# Patient Record
Sex: Male | Born: 1940 | Race: White | Hispanic: No | Smoking: Former smoker
Health system: Southern US, Community
[De-identification: ages and names within clinical notes are randomized; demographics above are authoritative.]

## PROBLEM LIST (undated history)

## (undated) DIAGNOSIS — D649 Anemia, unspecified: Secondary | ICD-10-CM

## (undated) DIAGNOSIS — I350 Nonrheumatic aortic (valve) stenosis: Secondary | ICD-10-CM

## (undated) DIAGNOSIS — I509 Heart failure, unspecified: Secondary | ICD-10-CM

## (undated) DIAGNOSIS — M109 Gout, unspecified: Secondary | ICD-10-CM

## (undated) DIAGNOSIS — I340 Nonrheumatic mitral (valve) insufficiency: Secondary | ICD-10-CM

## (undated) DIAGNOSIS — I5020 Unspecified systolic (congestive) heart failure: Secondary | ICD-10-CM

## (undated) DIAGNOSIS — K759 Inflammatory liver disease, unspecified: Secondary | ICD-10-CM

## (undated) DIAGNOSIS — I2510 Atherosclerotic heart disease of native coronary artery without angina pectoris: Secondary | ICD-10-CM

## (undated) DIAGNOSIS — R7303 Prediabetes: Secondary | ICD-10-CM

## (undated) DIAGNOSIS — I10 Essential (primary) hypertension: Secondary | ICD-10-CM

## (undated) DIAGNOSIS — E7800 Pure hypercholesterolemia, unspecified: Secondary | ICD-10-CM

## (undated) HISTORY — DX: Pure hypercholesterolemia, unspecified: E78.00

## (undated) HISTORY — PX: HERNIA REPAIR: SHX51

## (undated) HISTORY — DX: Essential (primary) hypertension: I10

## (undated) HISTORY — DX: Prediabetes: R73.03

## (undated) HISTORY — PX: EYE SURGERY: SHX253

## (undated) HISTORY — DX: Gout, unspecified: M10.9

---

## 1962-02-07 DIAGNOSIS — K759 Inflammatory liver disease, unspecified: Secondary | ICD-10-CM

## 1962-02-07 HISTORY — DX: Inflammatory liver disease, unspecified: K75.9

## 2007-09-04 ENCOUNTER — Encounter: Admission: RE | Admit: 2007-09-04 | Discharge: 2007-10-02 | Payer: Self-pay | Admitting: Family Medicine

## 2008-12-03 ENCOUNTER — Encounter: Admission: RE | Admit: 2008-12-03 | Discharge: 2008-12-23 | Payer: Self-pay | Admitting: Family Medicine

## 2011-12-26 ENCOUNTER — Other Ambulatory Visit: Payer: Self-pay | Admitting: Family Medicine

## 2011-12-26 DIAGNOSIS — IMO0002 Reserved for concepts with insufficient information to code with codable children: Secondary | ICD-10-CM

## 2011-12-27 ENCOUNTER — Ambulatory Visit
Admission: RE | Admit: 2011-12-27 | Discharge: 2011-12-27 | Disposition: A | Discharge status: Home / Self Care | Payer: Medicare Other | Source: Ambulatory Visit | Attending: Family Medicine | Admitting: Family Medicine

## 2011-12-27 DIAGNOSIS — IMO0002 Reserved for concepts with insufficient information to code with codable children: Secondary | ICD-10-CM

## 2012-09-17 ENCOUNTER — Encounter (INDEPENDENT_AMBULATORY_CARE_PROVIDER_SITE_OTHER): Payer: Self-pay | Admitting: Ophthalmology

## 2012-10-09 ENCOUNTER — Encounter (INDEPENDENT_AMBULATORY_CARE_PROVIDER_SITE_OTHER): Payer: 59 | Admitting: Ophthalmology

## 2012-10-09 DIAGNOSIS — H31009 Unspecified chorioretinal scars, unspecified eye: Secondary | ICD-10-CM

## 2012-10-09 DIAGNOSIS — I10 Essential (primary) hypertension: Secondary | ICD-10-CM

## 2012-10-09 DIAGNOSIS — H43819 Vitreous degeneration, unspecified eye: Secondary | ICD-10-CM

## 2012-10-09 DIAGNOSIS — D3130 Benign neoplasm of unspecified choroid: Secondary | ICD-10-CM

## 2012-10-09 DIAGNOSIS — H35039 Hypertensive retinopathy, unspecified eye: Secondary | ICD-10-CM

## 2013-02-11 ENCOUNTER — Ambulatory Visit (INDEPENDENT_AMBULATORY_CARE_PROVIDER_SITE_OTHER): Payer: Medicare Other | Admitting: Internal Medicine

## 2013-02-11 ENCOUNTER — Ambulatory Visit: Payer: Medicare Other

## 2013-02-11 VITALS — BP 140/82 | HR 91 | Temp 98.7°F | Resp 18 | Wt 206.0 lb

## 2013-02-11 DIAGNOSIS — Z23 Encounter for immunization: Secondary | ICD-10-CM

## 2013-02-11 DIAGNOSIS — K219 Gastro-esophageal reflux disease without esophagitis: Secondary | ICD-10-CM

## 2013-02-11 DIAGNOSIS — I10 Essential (primary) hypertension: Secondary | ICD-10-CM

## 2013-02-11 DIAGNOSIS — S61210A Laceration without foreign body of right index finger without damage to nail, initial encounter: Secondary | ICD-10-CM

## 2013-02-11 DIAGNOSIS — M109 Gout, unspecified: Secondary | ICD-10-CM

## 2013-02-11 DIAGNOSIS — M79609 Pain in unspecified limb: Secondary | ICD-10-CM

## 2013-02-11 DIAGNOSIS — S61209A Unspecified open wound of unspecified finger without damage to nail, initial encounter: Secondary | ICD-10-CM

## 2013-02-11 NOTE — Progress Notes (Signed)
Procedure Note: Verbal consent obtained from the patient.  Metacarpal block with 2 cc Lidocaine 2% without epinephrine.  Wound scrubbed with soap and water.  Wound explored.  No foreign bodies or deep structure injury noted.  Penrose drain applied as tourniquet to right index finger.  Wound closed with #6 simple interrupted sutures of 5-0 ethilon.  Tourniquet removed.  Hemostasis obtained.  Cap refill normal.  Area cleansed and dressed.  Pt tolerated very well.  Anticipate suture removal in 10 days.

## 2013-02-11 NOTE — Patient Instructions (Signed)

## 2013-02-11 NOTE — Progress Notes (Addendum)
Subjective:    Patient ID: [name], male    DOB: 01-May-1940, 73 y.o.   MRN: [id number]  This chart was scribed for [facility] by Celesta Gentile, Scribe. This patient was seen in room 7 and the patient's care was started at 7:13 PM.  Laceration  The incident occurred less than 1 hour ago. The laceration is located on the right hand. The laceration is 1 cm in size. The laceration mechanism was a clean knife. The pain is mild. He reports no foreign bodies present. His tetanus status is unknown.    HPI Comments: [name] is a 73 y.o. male who presents to the [facility] complaining of a laceration to his right index finger that occurred about an hour ago.  Pt was working at [employer], where he was using a clean knife to cut vegetables and cut the distal part of his right index finger.  Pt had immediate bleeding and continued to bleed upon arrival.  Pt was given gauze in the waiting room due to the amount of bleeding.  Pt does not believe the knife injured the phalangeal bone.  Pt does not recall if his tetanus is UTD.      History   Social History  . Marital Status: Married    Spouse Name: N/A    Number of Children: N/A  . Years of Education: N/A   Occupational History  . Not on file.   Social History Main Topics  . Smoking status: Former Research scientist (life sciences)  . Smokeless tobacco: Not on file  . Alcohol Use: Yes  . Drug Use: No  . Sexual Activity: Not on file   Other Topics Concern  . Not on file   Social History Narrative  . No narrative on file    Review of Systems  Constitutional: Negative for fever and chills.  HENT: Negative for congestion and rhinorrhea.   Respiratory: Negative for cough and shortness of breath.   Cardiovascular: Negative for chest pain.  Gastrointestinal: Negative for nausea, vomiting, abdominal pain and diarrhea.  Musculoskeletal: Negative for back pain.  Skin: Positive for wound (laceration on right index finger).  Negative for color change and rash.       Objective:   Physical Exam  Nursing note and vitals reviewed. Constitutional: He is oriented to person, place, and time. He appears well-developed and well-nourished. No distress.  HENT:  Head: Normocephalic and atraumatic.  Eyes: Conjunctivae are normal. Right eye exhibits no discharge. Left eye exhibits no discharge.  Neck: Normal range of motion.  Cardiovascular: Normal rate.   Pulmonary/Chest: Effort normal. No respiratory distress.  Musculoskeletal: Normal range of motion.  Neurological: He is alert and oriented to person, place, and time.  Skin: Skin is warm and dry.  1cm laceration on distal right index finger.   On the volar surface Fingertip is well vascularized and still has sensation intact PIP joint is stable/DIP joint is stable It is difficult to control his bleeding although he is on no medications that would affect this  Psychiatric: He has a normal mood and affect. His behavior is normal.    Triage Vitals: BP 140/82  Pulse 91  Temp(Src) 98.7 F (37.1 C) (Oral)  Resp 18  Wt 206 lb (93.441 kg)  SpO2 98%  DIAGNOSTIC STUDIES: Oxygen Saturation is 98% on RA, Normal by my interpretation.    COORDINATION OF CARE: 7:19 PM- Patient informed of current plan of treatment and evaluation and agrees with plan.  [facility] reading (PRIMARY) by  Dr. Stasia Cavalier fx distal tuft  Note suture repair by [place]:  Laceration of right index finger w/o foreign body w/o damage to nail, initial encounter - Plan: Tdap vaccine greater than or equal to 7yo IM, DG Finger Index Right  Pain, hand, right secondary  Wound care Suture removal 10 days  I have completed the patient encounter in its entirety as documented by the scribe, with editing by me where necessary. Baily Serpe P. Laney Pastor, M.D.

## 2013-02-12 DIAGNOSIS — M109 Gout, unspecified: Secondary | ICD-10-CM | POA: Insufficient documentation

## 2013-02-12 DIAGNOSIS — I10 Essential (primary) hypertension: Secondary | ICD-10-CM | POA: Insufficient documentation

## 2013-02-12 DIAGNOSIS — K219 Gastro-esophageal reflux disease without esophagitis: Secondary | ICD-10-CM | POA: Insufficient documentation

## 2013-02-21 ENCOUNTER — Ambulatory Visit (INDEPENDENT_AMBULATORY_CARE_PROVIDER_SITE_OTHER): Payer: Medicare Other | Admitting: Physician Assistant

## 2013-02-21 VITALS — BP 150/90 | HR 60 | Temp 97.9°F | Resp 16 | Wt 206.0 lb

## 2013-02-21 DIAGNOSIS — Z4802 Encounter for removal of sutures: Secondary | ICD-10-CM

## 2013-02-21 NOTE — Progress Notes (Signed)
   Subjective:    Patient ID: [name], male    DOB: [birth year], 73 y.o.   MRN: [id number]  HPI   [name] is a very pleasant 73 yr old male here for removal of sutures placed her 10 days ago.  He is doing well.  No pain, drainage.    Review of Systems  Constitutional: Negative for fever and chills.  Respiratory: Negative.   Cardiovascular: Negative.   Skin: Positive for wound.       Objective:   Physical Exam  Vitals reviewed. Constitutional: He is oriented to person, place, and time. He appears well-developed and well-nourished. No distress.  HENT:  Head: Normocephalic and atraumatic.  Eyes: Conjunctivae are normal. No scleral icterus.  Pulmonary/Chest: Effort normal.  Neurological: He is alert and oriented to person, place, and time.  Skin: Skin is warm and dry.  Healing wound at right index finger; sutures removed without difficulty; one steristrip placed for extra support  Psychiatric: He has a normal mood and affect. His behavior is normal.       Assessment & Plan:  Encounter for removal of sutures   [name] is a very pleasant 73 yr old male here for suture removal.  Sutures removed without difficulty.  Wound is healing very well.  One steri strip placed for extra support.  Pt asks if able to play disk golf this weekend.  I think this is fine as long as wound is covered and is not painful.  Pt to return as needs arise   E. Natividad Brood MHS, PA-C [facility] 1/15/201510:05 PM

## 2013-10-09 ENCOUNTER — Ambulatory Visit (INDEPENDENT_AMBULATORY_CARE_PROVIDER_SITE_OTHER): Payer: 59 | Admitting: Ophthalmology

## 2014-01-17 ENCOUNTER — Ambulatory Visit (INDEPENDENT_AMBULATORY_CARE_PROVIDER_SITE_OTHER): Payer: Medicare Other | Admitting: Family Medicine

## 2014-01-17 VITALS — BP 146/90 | HR 96 | Temp 98.3°F | Resp 16 | Ht 72.0 in | Wt 182.0 lb

## 2014-01-17 DIAGNOSIS — R51 Headache: Secondary | ICD-10-CM

## 2014-01-17 DIAGNOSIS — R519 Headache, unspecified: Secondary | ICD-10-CM

## 2014-01-17 DIAGNOSIS — S0101XA Laceration without foreign body of scalp, initial encounter: Secondary | ICD-10-CM

## 2014-01-17 NOTE — Progress Notes (Signed)
Procedure: Consent obtained.  2.5cc of 1% Lidocaine with Epi injected into the 2.4cm wound site for anesthesia.  Cleaned with soap and water.  6-0 ethicon utilized for 6 simple interrupted sutures.  Cleaned with normal saline.  Dressing applied.

## 2014-01-17 NOTE — Progress Notes (Signed)
[facility] [address], [place] [address] [phone]  Date:  01/17/2014   Name:  [name]   DOB:  07/16/1940   MRN:  [id number]  PCP:  No PCP Per Patient    Chief Complaint: Head Laceration   History of Present Illness:  [name] is a 73 y.o. very pleasant male patient who presents with the following:  He got hit in the head today with a hard frisbee golf frisbee.  No LOC. He had a bleeding cut so he came in to be seen.   No headache, nasuea or confusion.  He does not feel like he has a head injury except for this laceration.   He just had a tetanus shot.   He otherwise feels well today Generally in good health, history of HTN   Patient Active Problem List   Diagnosis Date Noted  . Gout 02/12/2013  . Unspecified essential hypertension 02/12/2013  . GERD (gastroesophageal reflux disease) 02/12/2013    Past Medical History  Diagnosis Date  . Hypertension   . Gout     History reviewed. No pertinent past surgical history.  History  Substance Use Topics  . Smoking status: Former Research scientist (life sciences)  . Smokeless tobacco: Not on file  . Alcohol Use: Yes    Family History  Problem Relation Age of Onset  . Stroke Mother   . Heart attack Father     No Known Allergies  Medication list has been reviewed and updated.  Current Outpatient Prescriptions on File Prior to Visit  Medication Sig Dispense Refill  . allopurinol (ZYLOPRIM) 100 MG tablet Take 100 mg by mouth daily.    Marland Kitchen lisinopril (PRINIVIL,ZESTRIL) 20 MG tablet Take 20 mg by mouth daily.    Marland Kitchen omeprazole (PRILOSEC) 20 MG capsule Take 20 mg by mouth daily.     No current facility-administered medications on file prior to visit.    Review of Systems:  As per HPI- otherwise negative.   Physical Examination: Filed Vitals:   01/17/14 1619  BP: 146/90  Pulse: 96  Temp: 98.3 F (36.8 C)  Resp: 16   Filed Vitals:   01/17/14 1619  Height: 6' (1.829 m)  Weight: 182 lb  (82.555 kg)   Body mass index is 24.68 kg/(m^2). Ideal Body Weight: Weight in (lb) to have BMI = 25: 183.9  GEN: WDWN, NAD, Non-toxic, A & O x 3, looks well HEENT: Atraumatic, Normocephalic. Neck supple. No masses, No LAD.  Bilateral TM wnl, oropharynx normal.  PEERL,EOMI.   Ears and Nose: No external deformity. CV: RRR, No M/G/R. No JVD. No thrill. No extra heart sounds. PULM: CTA B, no wheezes, crackles, rhonchi. No retractions. No resp. distress. No accessory muscle use. EXTR: No c/c/e NEURO Normal gait.  PSYCH: Normally interactive. Conversant. Not depressed or anxious appearing.  Calm demeanor.  There is an approx 2.5 cm laceration over his left brow.  It is well- approximated and does not appear to be deep  Assessment and Plan: Laceration of scalp, initial encounter  Pain of scalp  Treated laceration with repair as per PA-C note.    Signed Lamar Blinks, MD

## 2014-01-17 NOTE — Patient Instructions (Signed)

## 2014-01-22 ENCOUNTER — Encounter: Payer: Self-pay | Admitting: Urgent Care

## 2014-01-22 ENCOUNTER — Ambulatory Visit (INDEPENDENT_AMBULATORY_CARE_PROVIDER_SITE_OTHER): Payer: Medicare Other | Admitting: Urgent Care

## 2014-01-22 VITALS — BP 134/84 | HR 72 | Temp 98.0°F | Resp 18

## 2014-01-22 DIAGNOSIS — S0181XD Laceration without foreign body of other part of head, subsequent encounter: Secondary | ICD-10-CM

## 2014-01-22 DIAGNOSIS — Z4802 Encounter for removal of sutures: Secondary | ICD-10-CM

## 2014-01-22 NOTE — Progress Notes (Signed)
    MRN: [id number] DOB: 1940-04-11  Subjective:   [name] is a 73 y.o. male presenting for wound care of facial laceration sustained on 01/17/2014. Patient denies bleeding, drainage, erythema around wound. Also denies headache, confusion, fevers, n/v, changes in vision or sensation, ROM intact. Denies any other aggravating or relieving factors, no other questions or concerns.  [name] has a current medication list which includes the following prescription(s): allopurinol, lisinopril, and omeprazole.  He has No Known Allergies.  [name]  has a past medical history of Hypertension and Gout. Also  has no past surgical history on file.  ROS As in subjective.  Objective:   Vitals: BP 134/84 mmHg  Pulse 72  Temp(Src) 98 F (36.7 C) (Oral)  Resp 18  Physical Exam  Constitutional: He is oriented to person, place, and time and well-developed, well-nourished, and in no distress.  Eyes: Conjunctivae and EOM are normal. Right eye exhibits no discharge. Left eye exhibits no discharge. No scleral icterus.  Neck: Normal range of motion.  Cardiovascular: Normal rate.   Pulmonary/Chest: Effort normal.  Neurological: He is alert and oriented to person, place, and time.  Skin: Skin is warm and dry. No rash noted. He is not diaphoretic. No erythema.  Psychiatric: Mood and affect normal.   Laceration over left forehead just superior to left eyebrow well approximated, granulation tissue present, no erythema, edema or drainage.  Suture removed with minimal bleeding over 2 areas where suture was embedded. Otherwise sutures removed without incident.  Assessment and Plan :   1. Facial laceration, subsequent encounter 2. Encounter for removal of sutures - well-healed, sutures removed, anticipatory guidance provided - return to clinic if symptoms worsen, fail to resolve or as needed   Jaynee Eagles, PA-C [facility] [phone] 01/22/2014 4:23  PM

## 2015-02-10 ENCOUNTER — Ambulatory Visit (INDEPENDENT_AMBULATORY_CARE_PROVIDER_SITE_OTHER): Payer: Medicare Other | Admitting: Emergency Medicine

## 2015-02-10 VITALS — BP 146/88 | HR 50 | Temp 97.0°F | Resp 16 | Ht 71.0 in | Wt 185.6 lb

## 2015-02-10 DIAGNOSIS — M79644 Pain in right finger(s): Secondary | ICD-10-CM | POA: Diagnosis not present

## 2015-02-10 DIAGNOSIS — S61219A Laceration without foreign body of unspecified finger without damage to nail, initial encounter: Secondary | ICD-10-CM

## 2015-02-10 DIAGNOSIS — S61211A Laceration without foreign body of left index finger without damage to nail, initial encounter: Secondary | ICD-10-CM

## 2015-02-10 MED ORDER — CEPHALEXIN 250 MG PO CAPS: 250.0000 mg | ORAL_CAPSULE | Freq: Four times a day (QID) | ORAL | Status: AC

## 2015-02-10 NOTE — Progress Notes (Signed)
Procedure: verbal consent obtained. 1% lidocaine applied for anesthesia at the right middle finger mcp.  Cleansed with soap and water.  Tendon intact.  5-0 ethilon used to place simple interrupted sutures at the 2.cm  wound. Cleansed with normal saline Dressings applied.

## 2015-02-10 NOTE — Progress Notes (Signed)
Subjective:  Patient ID: [name], male    DOB: [birth year]  Age: 75 y.o. MRN: [id number]  CC: Hand Injury   HPI [name] presents   Has a laceration on the the dorsum of his right index finger. And that's the PIP joint. He suffered the injury last night working in  In the [employer]. He has been washing dishes since with the wound bandaged. He has no history of of anticoagulant use. He does have pain in his finger.  History [name] has a past medical history of Hypertension and Gout.   He has no past surgical history on file.   His  family history includes Heart attack in his father; Stroke in his mother.  He   reports that he has quit smoking. He does not have any smokeless tobacco history on file. He reports that he drinks alcohol. He reports that he does not use illicit drugs.  Outpatient Prescriptions Prior to Visit  Medication Sig Dispense Refill  . allopurinol (ZYLOPRIM) 100 MG tablet Take 100 mg by mouth daily.    Marland Kitchen omeprazole (PRILOSEC) 20 MG capsule Take 20 mg by mouth daily.    Marland Kitchen lisinopril (PRINIVIL,ZESTRIL) 20 MG tablet Take 20 mg by mouth daily. Reported on 02/10/2015     No facility-administered medications prior to visit.    Social History   Social History  . Marital Status: Married    Spouse Name: N/A  . Number of Children: N/A  . Years of Education: N/A   Social History Main Topics  . Smoking status: Former Research scientist (life sciences)  . Smokeless tobacco: None  . Alcohol Use: Yes  . Drug Use: No  . Sexual Activity: Not Asked   Other Topics Concern  . None   Social History Narrative     Review of Systems  Constitutional: Negative for fever, chills and appetite change.  HENT: Negative for congestion, ear pain, postnasal drip, sinus pressure and sore throat.   Eyes: Negative for pain and redness.  Respiratory: Negative for cough, shortness of breath and wheezing.   Cardiovascular: Negative for leg swelling.  Gastrointestinal: Negative for  nausea, vomiting, abdominal pain, diarrhea, constipation and blood in stool.  Endocrine: Negative for polyuria.  Genitourinary: Negative for dysuria, urgency, frequency and flank pain.  Musculoskeletal: Negative for gait problem.  Skin: Positive for wound. Negative for rash.  Neurological: Negative for weakness and headaches.  Psychiatric/Behavioral: Negative for confusion and decreased concentration. The patient is not nervous/anxious.     Objective:  BP 146/88 mmHg  Pulse 50  Temp(Src) 97 F (36.1 C) (Oral)  Resp 16  Ht 5\' 11"  (1.803 m)  Wt 185 lb 9.6 oz (84.188 kg)  BMI 25.90 kg/m2  SpO2 98%  Physical Exam  Constitutional: He is oriented to person, place, and time. He appears well-developed and well-nourished.  HENT:  Head: Normocephalic and atraumatic.  Eyes: Conjunctivae are normal. Pupils are equal, round, and reactive to light.  Pulmonary/Chest: Effort normal.  Musculoskeletal: He exhibits no edema.  Neurological: He is alert and oriented to person, place, and time.  Skin: Skin is dry. Laceration noted.  Psychiatric: He has a normal mood and affect. His behavior is normal. Thought content normal.    he has a 1 cm laceration transverse on the PIP joint of his right third finger. There is no neurovascular or tendon injury. There is body in the wound.   Assessment & Plan:   [name] was seen today for hand injury.  Diagnoses and all  orders for this visit:  Finger laceration, initial encounter -     cephALEXin (KEFLEX) 250 MG capsule; Take 1 capsule (250 mg total) by mouth 4 (four) times daily.   I am having [name] start on cephALEXin. I am also having him maintain his lisinopril, omeprazole, and allopurinol.  Meds ordered this encounter  Medications  . cephALEXin (KEFLEX) 250 MG capsule    Sig: Take 1 capsule (250 mg total) by mouth 4 (four) times daily.    Dispense:  28 capsule    Refill:  0    Order Specific Question:  Supervising Provider    Answer:   Wardell Honour [[id number]]    Appropriate red flag conditions were discussed with the patient as well as actions that should be taken.  Patient expressed his understanding.  Follow-up: No Follow-up on file.  Roselee Culver, MD

## 2015-02-10 NOTE — Patient Instructions (Signed)
Please take antibiotic as prescribed. Please return if you have increased redness, drainage, swelling, uncontrolled bleeding, or pain.   Otherwise we will see you in 7 days.    Sutured Wound Care Sutures are stitches that can be used to close wounds. Taking care of your wound properly can help to prevent pain and infection. It can also help your wound to heal more quickly. HOW TO CARE FOR YOUR SUTURED WOUND Wound Care  Keep the wound clean and dry.  If you were given a bandage (dressing), you should change it at least once per day or as directed by your health care provider. You should also change it if it becomes wet or dirty.  Keep the wound completely dry for the first 24 hours or as directed by your health care provider. After that time, you may shower or bathe. However, make sure that the wound is not soaked in water until the sutures have been removed.  Clean the wound one time each day or as directed by your health care provider.  Wash the wound with soap and water.  Rinse the wound with water to remove all soap.  Pat the wound dry with a clean towel. Do not rub the wound.  Aftercleaning the wound, apply a thin layer of antibioticointment as directed by your health care provider. This will help to prevent infection and keep the dressing from sticking to the wound.  Have the sutures removed as directed by your health care provider. General Instructions  Take or apply medicines only as directed by your health care provider.  To help prevent scarring, make sure to cover your wound with sunscreen whenever you are outside after the sutures are removed and the wound is healed. Make sure to wear a sunscreen of at least 30 SPF.  If you were prescribed an antibiotic medicine or ointment, finish all of it even if you start to feel better.  Do not scratch or pick at the wound.  Keep all follow-up visits as directed by your health care provider. This is important.  Check your wound  every day for signs of infection. Watch for:   Redness, swelling, or pain.  Fluid, blood, or pus.  Raise (elevate) the injured area above the level of your heart while you are sitting or lying down, if possible.  Avoid stretching your wound.  Drink enough fluids to keep your urine clear or pale yellow. SEEK MEDICAL CARE IF:  You received a tetanus shot and you have swelling, severe pain, redness, or bleeding at the injection site.  You have a fever.  A wound that was closed breaks open.  You notice a bad smell coming from the wound.  You notice something coming out of the wound, such as wood or glass.  Your pain is not controlled with medicine.  You have increased redness, swelling, or pain at the site of your wound.  You have fluid, blood, or pus coming from your wound.  You notice a change in the color of your skin near your wound.  You need to change the dressing frequently due to fluid, blood, or pus draining from the wound.  You develop a new rash.  You develop numbness around the wound. SEEK IMMEDIATE MEDICAL CARE IF:  You develop severe swelling around the injury site.  Your pain suddenly increases and is severe.  You develop painful lumps near the wound or on skin that is anywhere on your body.  You have a red streak going away  from your wound.  The wound is on your hand or foot and you cannot properly move a finger or toe.  The wound is on your hand or foot and you notice that your fingers or toes look pale or bluish.   This information is not intended to replace advice given to you by your health care provider. Make sure you discuss any questions you have with your health care provider.   Document Released: 03/03/2004 Document Revised: 06/10/2014 Document Reviewed: 09/05/2012 Elsevier Interactive Patient Education [employer].

## 2015-02-18 ENCOUNTER — Ambulatory Visit (INDEPENDENT_AMBULATORY_CARE_PROVIDER_SITE_OTHER): Payer: Medicare Other | Admitting: Family Medicine

## 2015-02-18 VITALS — BP 128/76 | HR 71 | Temp 97.6°F | Resp 20 | Wt 185.6 lb

## 2015-02-18 DIAGNOSIS — M79644 Pain in right finger(s): Secondary | ICD-10-CM

## 2015-02-18 NOTE — Progress Notes (Signed)
7 days out from laceration repair of right index finger, dorsal PIP  The proximal aspect of the right finger lac is opened, so only a few sutures removed No erythema of wound site, full ROM   Plan:  Return in 2 days for recheck.  Robyn Haber, MD

## 2015-02-18 NOTE — Patient Instructions (Signed)
Please return in 2 days for final suture removal

## 2015-02-20 ENCOUNTER — Ambulatory Visit (INDEPENDENT_AMBULATORY_CARE_PROVIDER_SITE_OTHER): Payer: Medicare Other | Admitting: Physician Assistant

## 2015-02-20 VITALS — BP 120/78 | HR 61 | Temp 98.0°F | Resp 20 | Ht 71.0 in | Wt 184.4 lb

## 2015-02-20 DIAGNOSIS — S61219D Laceration without foreign body of unspecified finger without damage to nail, subsequent encounter: Secondary | ICD-10-CM

## 2015-02-20 NOTE — Progress Notes (Signed)
Chief Complaint  Patient presents with  . Suture / Staple Removal    right hand, middle finger    History of Present Illness: Patient presents for suture removal.  He cut his finger accidentally 1/03 and presented here for repair. When he returned on 1/11, 2 sutures were removed, but due to the location over the PIP, 3 sutures were left to provide support to the wound. He did wear a splint for several days after the injury, but it was interfering with his ability to perform desired activities.  No pain, swelling, redness or drainage.   No Known Allergies  Prior to Admission medications   Medication Sig Start Date End Date Taking? Authorizing Provider  amLODipine (NORVASC) 2.5 MG tablet Take 2.5 mg by mouth daily. 01/26/15  Yes Historical Provider, MD  terbinafine (LAMISIL) 250 MG tablet Take 250 mg by mouth daily. 01/10/15  Yes Historical Provider, MD    Patient Active Problem List   Diagnosis Date Noted  . Gout 02/12/2013  . Unspecified essential hypertension 02/12/2013  . GERD (gastroesophageal reflux disease) 02/12/2013     Physical Exam  Constitutional: He is oriented to person, place, and time. He appears well-developed and well-nourished. He is active and cooperative. No distress.  BP 120/78 mmHg  Pulse 61  Temp(Src) 98 F (36.7 C) (Oral)  Resp 20  Ht 5\' 11"  (1.803 m)  Wt 184 lb 6.4 oz (83.643 kg)  BMI 25.73 kg/m2  SpO2 98%   Eyes: Conjunctivae are normal.  Pulmonary/Chest: Effort normal.  Neurological: He is alert and oriented to person, place, and time.  Skin: Skin is warm and dry. Laceration noted.  Crescent shaped laceration over the extensor surface of the RIGHT middle finger with 3 SI Ethilon sutures. The central portion of the wound had not fully granulated, consistent with repeated flexion of the finger, and the remaining sutures have pulled out of the distal wound edge.  They are removed without incident. There is no erythema, edema, drainage or bleeding.  Non-tender. No induration.  Psychiatric: He has a normal mood and affect. His speech is normal and behavior is normal.      ASSESSMENT & PLAN:  1. Finger laceration, subsequent encounter Local wound care. Anticipatory guidance. RTC PRN.   Fara Chute, PA-C Physician Assistant-Certified [facility]

## 2015-04-15 ENCOUNTER — Other Ambulatory Visit: Payer: Self-pay | Admitting: Surgery

## 2015-04-29 NOTE — Patient Instructions (Addendum)
Kameren Biondolillo  04/29/2015   Your procedure is scheduled on: 05-06-15  Report to [facility] Main  Entrance take [facility]  elevators to 3rd floor to  [place] at 630 AM.  Call this number if you have problems the morning of surgery [phone]   Remember: ONLY 1 PERSON MAY GO WITH YOU TO SHORT STAY TO GET  READY MORNING OF [place].  Do not eat food or drink liquids :After Midnight.     Take these medicines the morning of surgery with A SIP OF WATER: AMLODIPINE (NORVASC),ALLOPURINOL                               You may not have any metal on your body including hair pins and              piercings  Do not wear jewelry, make-up, lotions, powders or perfumes, deodorant             Do not wear nail polish.  Do not shave  48 hours prior to surgery.              Men may shave face and neck.   Do not bring valuables to the hospital. [place].  Contacts, dentures or bridgework may not be worn into surgery.  Leave suitcase in the car. After surgery it may be brought to your room.     Patients discharged the day of surgery will not be allowed to drive home.  Name and phone number of your driver:WIFE [facility] CELL [phone]  Special Instructions: N/A              Please read over the following fact sheets you were given: _____________________________________________________________________             [facility] - Preparing for Surgery Before surgery, you can play an important role.  Because skin is not sterile, your skin needs to be as free of germs as possible.  You can reduce the number of germs on your skin by washing with CHG (chlorahexidine gluconate) soap before surgery.  CHG is an antiseptic cleaner which kills germs and bonds with the skin to continue killing germs even after washing. Please DO NOT use if you have an allergy to CHG or antibacterial soaps.  If your skin becomes  reddened/irritated stop using the CHG and inform your nurse when you arrive at Short Stay. Do not shave (including legs and underarms) for at least 48 hours prior to the first CHG shower.  You may shave your face/neck. Please follow these instructions carefully:  1.  Shower with CHG Soap the night before surgery and the  morning of Surgery.  2.  If you choose to wash your hair, wash your hair first as usual with your  normal  shampoo.  3.  After you shampoo, rinse your hair and body thoroughly to remove the  shampoo.                           4.  Use CHG as you would any other liquid soap.  You can apply chg directly  to the skin and wash  Gently with a scrungie or clean washcloth.  5.  Apply the CHG Soap to your body ONLY FROM THE NECK DOWN.   Do not use on face/ open                           Wound or open sores. Avoid contact with eyes, ears mouth and genitals (private parts).                       Wash face,  Genitals (private parts) with your normal soap.             6.  Wash thoroughly, paying special attention to the area where your surgery  will be performed.  7.  Thoroughly rinse your body with warm water from the neck down.  8.  DO NOT shower/wash with your normal soap after using and rinsing off  the CHG Soap.                9.  Pat yourself dry with a clean towel.            10.  Wear clean pajamas.            11.  Place clean sheets on your bed the night of your first shower and do not  sleep with pets. Day of Surgery : Do not apply any lotions/deodorants the morning of surgery.  Please wear clean clothes to the hospital/surgery center.  FAILURE TO FOLLOW THESE INSTRUCTIONS MAY RESULT IN THE CANCELLATION OF YOUR SURGERY PATIENT SIGNATURE_________________________________  NURSE SIGNATURE__________________________________  ________________________________________________________________________

## 2015-05-04 ENCOUNTER — Encounter (HOSPITAL_COMMUNITY)
Admission: RE | Admit: 2015-05-04 | Discharge: 2015-05-04 | Disposition: A | Discharge status: Home / Self Care | Payer: Medicare Other | Source: Ambulatory Visit | Attending: Surgery | Admitting: Surgery

## 2015-05-04 ENCOUNTER — Encounter (HOSPITAL_COMMUNITY): Payer: Self-pay

## 2015-05-04 DIAGNOSIS — I10 Essential (primary) hypertension: Secondary | ICD-10-CM | POA: Diagnosis not present

## 2015-05-04 DIAGNOSIS — K4090 Unilateral inguinal hernia, without obstruction or gangrene, not specified as recurrent: Secondary | ICD-10-CM | POA: Diagnosis not present

## 2015-05-04 DIAGNOSIS — K219 Gastro-esophageal reflux disease without esophagitis: Secondary | ICD-10-CM | POA: Diagnosis not present

## 2015-05-04 DIAGNOSIS — Z87891 Personal history of nicotine dependence: Secondary | ICD-10-CM | POA: Diagnosis not present

## 2015-05-04 HISTORY — DX: Inflammatory liver disease, unspecified: K75.9

## 2015-05-04 LAB — CBC
HCT: 43.2 % (ref 39.0–52.0)
Hemoglobin: 14.8 g/dL (ref 13.0–17.0)
MCH: 32.7 pg (ref 26.0–34.0)
MCHC: 34.3 g/dL (ref 30.0–36.0)
MCV: 95.6 fL (ref 78.0–100.0)
Platelets: 177 10*3/uL (ref 150–400)
RBC: 4.52 MIL/uL (ref 4.22–5.81)
RDW: 12.4 % (ref 11.5–15.5)
WBC: 5.2 10*3/uL (ref 4.0–10.5)

## 2015-05-04 LAB — COMPREHENSIVE METABOLIC PANEL
ALT: 16 U/L — ABNORMAL LOW (ref 17–63)
AST: 22 U/L (ref 15–41)
Albumin: 4.3 g/dL (ref 3.5–5.0)
Alkaline Phosphatase: 64 U/L (ref 38–126)
Anion gap: 8 (ref 5–15)
BUN: 16 mg/dL (ref 6–20)
CO2: 29 mmol/L (ref 22–32)
Calcium: 9.8 mg/dL (ref 8.9–10.3)
Chloride: 104 mmol/L (ref 101–111)
Creatinine, Ser: 0.91 mg/dL (ref 0.61–1.24)
GFR calc Af Amer: >60 mL/min (ref >60)
GFR calc non Af Amer: >60 mL/min (ref >60)
Glucose, Bld: 97 mg/dL (ref 65–99)
Potassium: 4.6 mmol/L (ref 3.5–5.1)
Sodium: 141 mmol/L (ref 135–145)
Total Bilirubin: 0.6 mg/dL (ref 0.3–1.2)
Total Protein: 7.1 g/dL (ref 6.5–8.1)

## 2015-05-05 NOTE — H&P (Signed)
[name] 04/15/2015 10:06 AM Location: [name] Patient #: [id number] DOB: 03-Aug-1940 Married / Language: [name] / Race: White Male   History of Present Illness (Jesse Olivier A. Ninfa Linden MD; 04/15/2015 10:33 AM) The patient is a 75 year old male who presents with an inguinal hernia. This is a pleasant gentleman referred by Dr. Melinda [name] for evaluation of a right inguinal hernia. The patient has had a previous open left inguinal hernia with mesh in [place] approximately 10 years ago. He has recently been having increasing right groin discomfort and has noticed a small reducible bulge. He has no obstructive symptoms. He is otherwise healthy and active and has no complaints.   Other Problems [name], CMA; 04/15/2015 10:07 AM) Back Pain Gastroesophageal Reflux Disease High blood pressure Inguinal Hernia  Past Surgical History [name], CMA; 04/15/2015 10:07 AM) Cataract Surgery Bilateral. Open Inguinal Hernia Surgery Left. Oral Surgery Vasectomy  Allergies [name], [name]; 04/15/2015 10:07 AM) No Known Drug Allergies03/09/2015  Medication History [name], CMA; 04/15/2015 10:08 AM) Allopurinol (100MG  Tablet, Oral two times daily) Active. AmLODIPine Besylate (2.5MG  Tablet, Oral) Active. Medications Reconciled  Social History [name], [name]; 04/15/2015 10:07 AM) Caffeine use Coffee. Tobacco use Former smoker.  Family History [name], [name]; 04/15/2015 10:07 AM) Heart Disease Father, Mother. Heart disease in male family member before age 20    Review of Systems [name] CMA; 04/15/2015 10:07 AM) General Not Present- Appetite Loss, Chills, Fatigue, Fever, Night Sweats, Weight Gain and Weight Loss. Skin Not Present- Change in Wart/Mole, Dryness, Hives, Jaundice, New Lesions, Non-Healing Wounds, Rash and Ulcer. HEENT Not Present- Earache, Hearing Loss, Hoarseness, Nose Bleed, Oral Ulcers, Ringing in the Ears, Seasonal Allergies, Sinus  Pain, Sore Throat, Visual Disturbances, Wears glasses/contact lenses and Yellow Eyes. Respiratory Not Present- Bloody sputum, Chronic Cough, Difficulty Breathing, Snoring and Wheezing. Breast Not Present- Breast Mass, Breast Pain, Nipple Discharge and Skin Changes. Cardiovascular Not Present- Chest Pain, Difficulty Breathing Lying Down, Leg Cramps, Palpitations, Rapid Heart Rate, Shortness of Breath and Swelling of Extremities. Male Genitourinary Present- Nocturia. Not Present- Blood in Urine, Change in Urinary Stream, Frequency, Impotence, Painful Urination, Urgency and Urine Leakage. Endocrine Not Present- Cold Intolerance, Excessive Hunger, Hair Changes, Heat Intolerance, Hot flashes and New Diabetes. Hematology Not Present- Easy Bruising, Excessive bleeding, Gland problems, HIV and Persistent Infections.  Vitals [name] CMA; 04/15/2015 10:08 AM) 04/15/2015 10:08 AM Weight: 184.8 lb Height: 71in Body Surface Area: 2.04 m Body Mass Index: 25.77 kg/m  Temp.: 98.60F(Temporal)  Pulse: 79 (Regular)  BP: 136/64 (Sitting, Left Arm, Standard)       Physical Exam (Jesse [place] A. Ninfa Linden MD; 04/15/2015 10:33 AM) General Mental Status-Alert. General Appearance-Consistent with stated age. Hydration-Well hydrated. Voice-Normal.  Head and Neck Head-normocephalic, atraumatic with no lesions or palpable masses. Trachea-midline.  Eye Eyeball - Bilateral-Extraocular movements intact. Sclera/Conjunctiva - Bilateral-No scleral icterus.  Chest and Lung Exam Chest and lung exam reveals -quiet, even and easy respiratory effort with no use of accessory muscles and on auscultation, normal breath sounds, no adventitious sounds and normal vocal resonance. Inspection Chest Wall - Normal. Back - normal.  Cardiovascular Cardiovascular examination reveals -normal heart sounds, regular rate and rhythm with no murmurs and normal pedal pulses  bilaterally.  Abdomen Inspection Skin - Scar - no surgical scars. Hernias - Inguinal hernia - Right - Reducible. Palpation/Percussion Palpation and Percussion of the abdomen reveal - Soft, Non Tender, No Rebound tenderness, No Rigidity (guarding) and No hepatosplenomegaly. Auscultation Auscultation of the abdomen  reveals - Bowel sounds normal.  Neurologic Neurologic evaluation reveals -alert and oriented x 3 with no impairment of recent or remote memory. Mental Status-Normal.  Musculoskeletal Normal Exam - Left-Upper Extremity Strength Normal and Lower Extremity Strength Normal. Normal Exam - Right-Upper Extremity Strength Normal, Lower Extremity Weakness.    Assessment & Plan (Jesse [place] A. Ninfa Linden MD; 04/15/2015 10:35 AM) INGUINAL HERNIA OF RIGHT SIDE WITHOUT OBSTRUCTION OR GANGRENE (K40.90) Impression: I discussed the diagnosis in detail with the patient and his wife. I discussed hernia repair. I discussed both laparoscopic and open repair with mesh. He wished to proceed with a laparoscopic right inguinal hernia repair with mesh. I gave him literature regarding this. I discussed the risk of surgery which includes but is not limited to bleeding, infection, recurrence, nerve entrapment, chronic pain, postoperative recovery, etc. He understands and wished to proceed with surgery which will be scheduled Current Plans Pt Education - Pamphlet Given - Laparoscopic Hernia Repair: discussed with patient and provided information.

## 2015-05-06 ENCOUNTER — Ambulatory Visit (HOSPITAL_COMMUNITY): Payer: Medicare Other | Admitting: Certified Registered"

## 2015-05-06 ENCOUNTER — Encounter (HOSPITAL_COMMUNITY): Payer: Self-pay | Admitting: *Deleted

## 2015-05-06 ENCOUNTER — Ambulatory Visit (HOSPITAL_COMMUNITY)
Admission: RE | Admit: 2015-05-06 | Discharge: 2015-05-06 | Disposition: A | Discharge status: Home / Self Care | Payer: Medicare Other | Source: Ambulatory Visit | Attending: Surgery | Admitting: Surgery

## 2015-05-06 ENCOUNTER — Encounter (HOSPITAL_COMMUNITY)
Admission: RE | Disposition: A | Discharge status: Home / Self Care | Payer: Self-pay | Source: Ambulatory Visit | Attending: Surgery

## 2015-05-06 DIAGNOSIS — K219 Gastro-esophageal reflux disease without esophagitis: Secondary | ICD-10-CM | POA: Insufficient documentation

## 2015-05-06 DIAGNOSIS — Z87891 Personal history of nicotine dependence: Secondary | ICD-10-CM | POA: Insufficient documentation

## 2015-05-06 DIAGNOSIS — I10 Essential (primary) hypertension: Secondary | ICD-10-CM | POA: Insufficient documentation

## 2015-05-06 DIAGNOSIS — K4090 Unilateral inguinal hernia, without obstruction or gangrene, not specified as recurrent: Secondary | ICD-10-CM | POA: Insufficient documentation

## 2015-05-06 HISTORY — PX: INSERTION OF MESH: SHX5868

## 2015-05-06 HISTORY — PX: INGUINAL HERNIA REPAIR: SHX194

## 2015-05-06 SURGERY — REPAIR, HERNIA, INGUINAL, LAPAROSCOPIC
Anesthesia: General | Site: Inguinal | Laterality: Right

## 2015-05-06 MED ORDER — SUGAMMADEX SODIUM 200 MG/2ML IV SOLN
INTRAVENOUS | Status: AC
  Filled 2015-05-06: qty 2

## 2015-05-06 MED ORDER — HYDROMORPHONE HCL 1 MG/ML IJ SOLN
INTRAMUSCULAR | Status: AC
  Filled 2015-05-06: qty 1

## 2015-05-06 MED ORDER — EPHEDRINE SULFATE 50 MG/ML IJ SOLN
INTRAMUSCULAR | Status: DC | PRN
  Administered 2015-05-06 (×2): 10 mg via INTRAVENOUS

## 2015-05-06 MED ORDER — ONDANSETRON HCL 4 MG/2ML IJ SOLN
INTRAMUSCULAR | Status: AC
  Filled 2015-05-06: qty 2

## 2015-05-06 MED ORDER — LIDOCAINE HCL (PF) 2 % IJ SOLN
INTRAMUSCULAR | Status: DC | PRN
  Administered 2015-05-06: 60 mg

## 2015-05-06 MED ORDER — OXYCODONE HCL 5 MG PO TABS: 5.0000 mg | ORAL_TABLET | ORAL | Status: DC | PRN

## 2015-05-06 MED ORDER — PROPOFOL 10 MG/ML IV BOLUS
INTRAVENOUS | Status: AC
  Filled 2015-05-06: qty 20

## 2015-05-06 MED ORDER — SODIUM CHLORIDE 0.9% FLUSH: 3.0000 mL | INTRAVENOUS | Status: DC | PRN

## 2015-05-06 MED ORDER — HYDROMORPHONE HCL 1 MG/ML IJ SOLN
0.2500 mg | INTRAMUSCULAR | Status: DC | PRN
  Administered 2015-05-06 (×2): 0.5 mg via INTRAVENOUS

## 2015-05-06 MED ORDER — BUPIVACAINE HCL (PF) 0.5 % IJ SOLN
INTRAMUSCULAR | Status: AC
  Filled 2015-05-06: qty 30

## 2015-05-06 MED ORDER — ACETAMINOPHEN 325 MG PO TABS: 650.0000 mg | ORAL_TABLET | ORAL | Status: DC | PRN

## 2015-05-06 MED ORDER — HYDROCODONE-ACETAMINOPHEN 5-325 MG PO TABS: 1.0000 | ORAL_TABLET | ORAL | Status: DC | PRN

## 2015-05-06 MED ORDER — CEFAZOLIN SODIUM-DEXTROSE 2-4 GM/100ML-% IV SOLN
2.0000 g | INTRAVENOUS | Status: AC
  Administered 2015-05-06: 2 g via INTRAVENOUS

## 2015-05-06 MED ORDER — MIDAZOLAM HCL 5 MG/5ML IJ SOLN
INTRAMUSCULAR | Status: DC | PRN
  Administered 2015-05-06: 1 mg via INTRAVENOUS

## 2015-05-06 MED ORDER — BUPIVACAINE HCL (PF) 0.5 % IJ SOLN
INTRAMUSCULAR | Status: DC | PRN
  Administered 2015-05-06: 20 mL

## 2015-05-06 MED ORDER — ACETAMINOPHEN 650 MG RE SUPP
650.0000 mg | RECTAL | Status: DC | PRN
  Filled 2015-05-06: qty 1

## 2015-05-06 MED ORDER — ONDANSETRON HCL 4 MG/2ML IJ SOLN
INTRAMUSCULAR | Status: DC | PRN
  Administered 2015-05-06 (×2): 4 mg via INTRAVENOUS

## 2015-05-06 MED ORDER — CEFAZOLIN SODIUM-DEXTROSE 2-3 GM-% IV SOLR
INTRAVENOUS | Status: AC
  Filled 2015-05-06: qty 50

## 2015-05-06 MED ORDER — LACTATED RINGERS IV SOLN
INTRAVENOUS | Status: DC | PRN
  Administered 2015-05-06 (×2): via INTRAVENOUS

## 2015-05-06 MED ORDER — ROCURONIUM BROMIDE 100 MG/10ML IV SOLN
INTRAVENOUS | Status: AC
  Filled 2015-05-06: qty 1

## 2015-05-06 MED ORDER — MORPHINE SULFATE (PF) 10 MG/ML IV SOLN: 1.0000 mg | INTRAVENOUS | Status: DC | PRN

## 2015-05-06 MED ORDER — LIDOCAINE HCL (CARDIAC) 20 MG/ML IV SOLN
INTRAVENOUS | Status: AC
  Filled 2015-05-06: qty 5

## 2015-05-06 MED ORDER — SUGAMMADEX SODIUM 200 MG/2ML IV SOLN
INTRAVENOUS | Status: DC | PRN
  Administered 2015-05-06: 200 mg via INTRAVENOUS

## 2015-05-06 MED ORDER — MIDAZOLAM HCL 2 MG/2ML IJ SOLN
INTRAMUSCULAR | Status: AC
  Filled 2015-05-06: qty 2

## 2015-05-06 MED ORDER — ONDANSETRON HCL 4 MG/2ML IJ SOLN: 4.0000 mg | Freq: Once | INTRAMUSCULAR | Status: DC | PRN

## 2015-05-06 MED ORDER — DEXAMETHASONE SODIUM PHOSPHATE 10 MG/ML IJ SOLN
INTRAMUSCULAR | Status: AC
  Filled 2015-05-06: qty 1

## 2015-05-06 MED ORDER — ROCURONIUM BROMIDE 100 MG/10ML IV SOLN
INTRAVENOUS | Status: DC | PRN
  Administered 2015-05-06: 30 mg via INTRAVENOUS
  Administered 2015-05-06: 20 mg via INTRAVENOUS

## 2015-05-06 MED ORDER — SODIUM CHLORIDE 0.9 % IV SOLN: 250.0000 mL | INTRAVENOUS | Status: DC | PRN

## 2015-05-06 MED ORDER — FENTANYL CITRATE (PF) 100 MCG/2ML IJ SOLN
INTRAMUSCULAR | Status: DC | PRN
  Administered 2015-05-06 (×2): 50 ug via INTRAVENOUS

## 2015-05-06 MED ORDER — SODIUM CHLORIDE 0.9% FLUSH: 3.0000 mL | Freq: Two times a day (BID) | INTRAVENOUS | Status: DC

## 2015-05-06 MED ORDER — MEPERIDINE HCL 50 MG/ML IJ SOLN: 6.2500 mg | INTRAMUSCULAR | Status: DC | PRN

## 2015-05-06 MED ORDER — PROPOFOL 10 MG/ML IV BOLUS
INTRAVENOUS | Status: DC | PRN
  Administered 2015-05-06: 180 mg via INTRAVENOUS

## 2015-05-06 MED ORDER — EPHEDRINE SULFATE 50 MG/ML IJ SOLN
INTRAMUSCULAR | Status: AC
  Filled 2015-05-06: qty 2

## 2015-05-06 MED ORDER — DEXAMETHASONE SODIUM PHOSPHATE 10 MG/ML IJ SOLN
INTRAMUSCULAR | Status: DC | PRN
  Administered 2015-05-06: 5 mg via INTRAVENOUS

## 2015-05-06 MED ORDER — LIDOCAINE HCL (CARDIAC) 20 MG/ML IV SOLN
INTRAVENOUS | Status: DC | PRN
  Administered 2015-05-06: 40 mg via INTRAVENOUS

## 2015-05-06 MED ORDER — FENTANYL CITRATE (PF) 100 MCG/2ML IJ SOLN
INTRAMUSCULAR | Status: AC
  Filled 2015-05-06: qty 2

## 2015-05-06 SURGICAL SUPPLY — 35 items
BANDAGE ADH SHEER 1  50/CT (GAUZE/BANDAGES/DRESSINGS) IMPLANT
BENZOIN TINCTURE PRP APPL 2/3 (GAUZE/BANDAGES/DRESSINGS) ×2 IMPLANT
COVER SURGICAL LIGHT HANDLE (MISCELLANEOUS) ×2 IMPLANT
DECANTER SPIKE VIAL GLASS SM (MISCELLANEOUS) ×2 IMPLANT
DEVICE SECURE STRAP 25 ABSORB (INSTRUMENTS) ×2 IMPLANT
DISSECT BALLN SPACEMKR + OVL (BALLOONS) ×2
DISSECTOR BALLN SPACEMKR + OVL (BALLOONS) ×1 IMPLANT
DISSECTOR BLUNT TIP ENDO 5MM (MISCELLANEOUS) IMPLANT
DRAPE LAPAROSCOPIC ABDOMINAL (DRAPES) ×2 IMPLANT
ELECT REM PT RETURN 9FT ADLT (ELECTROSURGICAL) ×2
ELECTRODE REM PT RTRN 9FT ADLT (ELECTROSURGICAL) ×1 IMPLANT
GLOVE BIOGEL PI IND STRL 7.0 (GLOVE) ×2 IMPLANT
GLOVE BIOGEL PI INDICATOR 7.0 (GLOVE) ×2
GLOVE SURG SIGNA 7.5 PF LTX (GLOVE) ×4 IMPLANT
GOWN STRL REUS W/TWL LRG LVL3 (GOWN DISPOSABLE) ×2 IMPLANT
GOWN STRL REUS W/TWL XL LVL3 (GOWN DISPOSABLE) ×4 IMPLANT
KIT BASIN OR (CUSTOM PROCEDURE TRAY) ×2 IMPLANT
LIQUID BAND (GAUZE/BANDAGES/DRESSINGS) ×2 IMPLANT
MARKER SKIN DUAL TIP RULER LAB (MISCELLANEOUS) ×2 IMPLANT
MESH 3DMAX 4X6 RT LRG (Mesh General) ×2 IMPLANT
NEEDLE INSUFFLATION 14GA 120MM (NEEDLE) IMPLANT
PAD POSITIONING PINK XL (MISCELLANEOUS) IMPLANT
POSITIONER SURGICAL ARM (MISCELLANEOUS) IMPLANT
SCISSORS LAP 5X35 DISP (ENDOMECHANICALS) IMPLANT
SET IRRIG TUBING LAPAROSCOPIC (IRRIGATION / IRRIGATOR) IMPLANT
SOLUTION ANTI FOG 6CC (MISCELLANEOUS) ×2 IMPLANT
STRIP CLOSURE SKIN 1/2X4 (GAUZE/BANDAGES/DRESSINGS) ×2 IMPLANT
SUT MNCRL AB 4-0 PS2 18 (SUTURE) ×2 IMPLANT
TAPE CLOTH 4X10 WHT NS (GAUZE/BANDAGES/DRESSINGS) IMPLANT
TOWEL OR 17X26 10 PK STRL BLUE (TOWEL DISPOSABLE) ×2 IMPLANT
TRAY FOLEY W/METER SILVER 14FR (SET/KITS/TRAYS/PACK) ×2 IMPLANT
TRAY FOLEY W/METER SILVER 16FR (SET/KITS/TRAYS/PACK) ×2 IMPLANT
TRAY LAPAROSCOPIC (CUSTOM PROCEDURE TRAY) ×2 IMPLANT
TROCAR CANNULA W/PORT DUAL 5MM (MISCELLANEOUS) ×2 IMPLANT
TUBING INSUF HEATED (TUBING) ×2 IMPLANT

## 2015-05-06 NOTE — Op Note (Signed)
LAPAROSCOPIC RIGHT INGUINAL HERNIA WITH MESH, INSERTION OF MESH  Procedure Note  [name] 05/06/2015   Pre-op Diagnosis: Right inguinal hernia     Post-op Diagnosis: same  Procedure(s): LAPAROSCOPIC RIGHT INGUINAL HERNIA WITH MESH INSERTION OF MESH  Surgeon(s): Coralie Keens, MD  Anesthesia: General  Staff:  Circulator: Roseanne Reno, RN Scrub Person: Enid Cutter, RN  Estimated Blood Loss: Minimal                         [name]   Date: 05/06/2015  Time: 8:46 AM

## 2015-05-06 NOTE — Op Note (Signed)
NAMEMarland Kitchen  JERAL, Jesse NO.:  [name]  MEDICAL RECORD NO.:  [id number]  LOCATION:  [facility]                         FACILITY:  [facility]  PHYSICIAN:  Coralie Keens, M.D. DATE OF BIRTH:  [birth year]  DATE OF PROCEDURE:  05/06/2015 DATE OF DISCHARGE:                              OPERATIVE REPORT   PREOPERATIVE DIAGNOSIS:  Right inguinal hernia.  POSTOPERATIVE DIAGNOSIS:  Right inguinal hernia.  PROCEDURE:  Laparoscopic right inguinal hernia repair with mesh.  SURGEON:  Coralie Keens, M.D.  ANESTHESIA:  General endotracheal anesthesia.  ESTIMATED BLOOD LOSS:  Minimal.  FINDINGS:  Jesse patient was found to have a right direct as well as a right indirect inguinal hernia.  Jesse hernias were repaired with a piece of large Prolene 3DMax mesh.  PROCEDURE IN DETAIL:  Jesse patient was brought to Jesse operating room and identified as [name].  He was placed supine on Jesse operating room table and general anesthesia was induced.  A Foley catheter was then inserted.  His abdomen was then prepped and draped in usual sterile fashion.  I made a small vertical incision just below Jesse umbilicus.  I carried this down to Jesse fascia.  Jesse fascia was then opened and Jesse rectus muscle was elevated.  I passed Jesse dissecting balloon underneath Jesse rectus muscle and manipulated it toward Jesse pubis.  Jesse dissecting balloon was then insufflated under direct vision dissecting out Jesse preperitoneal space.  Jesse dissecting balloon was then removed and insufflation was begun with carbon dioxide.  I then placed two 5-mm ports in Jesse patient's lower midline both under direct vision.  Jesse patient had a large direct hernia defect.  I then dissected out Jesse right testicular cord and structures.  Jesse patient had an indirect hernia sac as well, which I was able to reduce from Jesse cord structures. No other abnormalities were identified.  I next brought a piece of Bard 3DMax Prolene mesh onto  Jesse field.  A large piece of mesh was used.  It was placed through Jesse umbilicus trocar and then laid as an onlay on Jesse right inguinal floor.  I then tacked Jesse mesh to Cooper's ligament up Jesse medial abdominal wall and slightly laterally with Jesse absorbable tacker.  Wide coverage of Jesse direct defect and cord structures appeared to be achieved.  Hemostasis also appeared to be achieved.  All ports were removed under direct vision, and Jesse preperitoneal space was seemed to collapse appropriately with Jesse mesh lying in place.  I then closed Jesse fascia at Jesse umbilicus with a 0 Vicryl figure-of-eight suture.  All incisions were anesthetized with Marcaine, and I performed a right ilioinguinal nerve block with Marcaine as well.  All incisions were then closed with 4-0 Monocryl sutures and skin glue.  Jesse patient tolerated Jesse procedure well.  All Jesse counts were correct at Jesse end of Jesse procedure.  Jesse Foley catheter was removed.  Jesse patient was then extubated in Jesse operating room and taken in a stable condition to Jesse recovery room.     Coralie Keens, M.D.     DB/MEDQ  D:  05/06/2015  T:  05/06/2015  Job:  [id number]

## 2015-05-06 NOTE — Anesthesia Postprocedure Evaluation (Signed)
Anesthesia Post Note  Patient: [name]  Procedure(s) Performed: Procedure(s) (LRB): LAPAROSCOPIC RIGHT INGUINAL HERNIA WITH MESH (Right) INSERTION OF MESH (Right)  Patient location during evaluation: PACU Anesthesia Type: General Level of consciousness: awake and alert Pain management: pain level controlled Vital Signs Assessment: post-procedure vital signs reviewed and stable Respiratory status: spontaneous breathing, nonlabored ventilation, respiratory function stable and patient connected to nasal cannula oxygen Cardiovascular status: blood pressure returned to baseline and stable Postop Assessment: no signs of nausea or vomiting Anesthetic complications: no    Last Vitals:  Filed Vitals:   05/06/15 1003 05/06/15 1047  BP: 143/75 150/79  Pulse: 76 68  Temp: 36.4 C 36.4 C  Resp: 15 16    Last Pain:  Filed Vitals:   05/06/15 1048  PainSc: 2                  Seanna Sisler DAVID

## 2015-05-06 NOTE — Interval H&P Note (Signed)
History and Physical Interval Note: no change in H and P  05/06/2015 7:54 AM  [name]  has presented today for surgery, with the diagnosis of Right inguinal hernia  The various methods of treatment have been discussed with the patient and family. After consideration of risks, benefits and other options for treatment, the patient has consented to  Procedure(s): LAPAROSCOPIC RIGHT INGUINAL HERNIA WITH MESH (Right) INSERTION OF MESH (Right) as a surgical intervention .  The patient's history has been reviewed, patient examined, no change in status, stable for surgery.  I have reviewed the patient's chart and labs.  Questions were answered to the patient's satisfaction.     Kalei Meda A

## 2015-05-06 NOTE — Anesthesia Procedure Notes (Signed)
Procedure Name: Intubation Date/Time: 05/06/2015 8:33 AM Performed by: Freddie Breech Pre-anesthesia Checklist: Patient identified, Emergency Drugs available, Suction available, Patient being monitored and Timeout performed Patient Re-evaluated:Patient Re-evaluated prior to inductionOxygen Delivery Method: Circle system utilized Preoxygenation: Pre-oxygenation with 100% oxygen Intubation Type: IV induction Ventilation: Oral airway inserted - appropriate to patient size and Mask ventilation without difficulty Laryngoscope Size: Mac and 3 Grade View: Grade II Tube type: Oral Tube size: 7.0 mm Number of attempts: 1 Airway Equipment and Method: Patient positioned with wedge pillow and Stylet Placement Confirmation: ETT inserted through vocal cords under direct vision,  positive ETCO2,  CO2 detector and breath sounds checked- equal and bilateral Secured at: 23 cm Tube secured with: Tape Dental Injury: Teeth and Oropharynx as per pre-operative assessment

## 2015-05-06 NOTE — Anesthesia Preprocedure Evaluation (Signed)
Anesthesia Evaluation  Patient identified by MRN, date of birth, ID band Patient awake    Reviewed: Allergy & Precautions, NPO status , Patient's Chart, lab work & pertinent test results  Airway Mallampati: I  TM Distance: >3 FB Neck ROM: Full    Dental   Pulmonary former smoker,    Pulmonary exam normal        Cardiovascular hypertension, Pt. on medications Normal cardiovascular exam     Neuro/Psych    GI/Hepatic GERD  Medicated and Controlled,(+) Hepatitis -  Endo/Other    Renal/GU      Musculoskeletal   Abdominal   Peds  Hematology   Anesthesia Other Findings   Reproductive/Obstetrics                             Anesthesia Physical Anesthesia Plan  ASA: II  Anesthesia Plan: General   Post-op Pain Management:    Induction: Intravenous  Airway Management Planned: Oral ETT  Additional Equipment:   Intra-op Plan:   Post-operative Plan: Extubation in OR  Informed Consent: I have reviewed the patients History and Physical, chart, labs and discussed the procedure including the risks, benefits and alternatives for the proposed anesthesia with the patient or authorized representative who has indicated his/her understanding and acceptance.     Plan Discussed with: CRNA and Surgeon  Anesthesia Plan Comments:         Anesthesia Quick Evaluation

## 2015-05-06 NOTE — Discharge Instructions (Addendum)
[facility] ______[facility] LAPAROSCOPIC SURGERY: POST OP INSTRUCTIONS Always review your discharge instruction sheet given to you by the facility where your surgery was performed. IF YOU HAVE DISABILITY OR FAMILY LEAVE FORMS, YOU MUST BRING THEM TO THE OFFICE FOR PROCESSING.   DO NOT GIVE THEM TO YOUR DOCTOR.  1. A prescription for pain medication may be given to you upon discharge.  Take your pain medication as prescribed, if needed.  If narcotic pain medicine is not needed, then you may take acetaminophen (Tylenol) or ibuprofen (Advil) as needed. 2. Take your usually prescribed medications unless otherwise directed. 3. If you need a refill on your pain medication, please contact your pharmacy.  They will contact our office to request authorization. Prescriptions will not be filled after 5pm or on week-ends. 4. You should follow a light diet the first few days after arrival home, such as soup and crackers, etc.  Be sure to include lots of fluids daily. 5. Most patients will experience some swelling and bruising in the area of the incisions.  Ice packs will help.  Swelling and bruising can take several days to resolve.  6. It is common to experience some constipation if taking pain medication after surgery.  Increasing fluid intake and taking a stool softener (such as Colace) will usually help or prevent this problem from occurring.  A mild laxative (Milk of Magnesia or Miralax) should be taken according to package instructions if there are no bowel movements after 48 hours. 7. Unless discharge instructions indicate otherwise, you may remove your bandages 24-48 hours after surgery, and you may shower at that time.  You may have steri-strips (small skin tapes) in place directly over the incision.  These strips should be left on the skin for 7-10 days.  If your surgeon used skin glue on the incision, you may shower in 24 hours.  The glue will flake off over the next 2-3 weeks.  Any sutures or  staples will be removed at the office during your follow-up visit. 8. ACTIVITIES:  You may resume regular (light) daily activities beginning the next day--such as daily self-care, walking, climbing stairs--gradually increasing activities as tolerated.  You may have sexual intercourse when it is comfortable.  Refrain from any heavy lifting or straining until approved by your doctor. a. You may drive when you are no longer taking prescription pain medication, you can comfortably wear a seatbelt, and you can safely maneuver your car and apply brakes. b. RETURN TO WORK:  __________________________________________________________ 9. You should see your doctor in the office for a follow-up appointment approximately 2-3 weeks after your surgery.  Make sure that you call for this appointment within a day or two after you arrive home to insure a convenient appointment time. 10. OTHER INSTRUCTIONS:NO LIFTING MORE THAN 15 POUNDS FOR 3 WEEKS 11. OK TO SHOWER TOMORROW 12. ICE PACK AND IBUPROFEN ALSO FOR PAIN __________________________________________________________________________________________________________________________ __________________________________________________________________________________________________________________________ WHEN TO CALL YOUR DOCTOR: 1. Fever over 101.0 2. Inability to urinate 3. Continued bleeding from incision. 4. Increased pain, redness, or drainage from the incision. 5. Increasing abdominal pain  The clinic staff is available to answer your questions during regular business hours.  Please dont hesitate to call and ask to speak to one of the nurses for clinical concerns.  If you have a medical emergency, go to the nearest emergency room or call 911.  A surgeon from [facility] is always on call at the hospital. [address], [place], [place], [place]  [id number] ? [address], [place], [place]   [id number] [phone] ? [phone] ? FAX [phone]  [phone] Web site: [email or web address] Post Anesthesia Home Care Instructions  Activity: Get plenty of rest for the remainder of the day. A responsible adult should stay with you for 24 hours following the procedure.  For the next 24 hours, DO NOT: -Drive a car -Paediatric nurse -Drink alcoholic beverages -Take any medication unless instructed by your physician -Make any legal decisions or sign important papers.  Meals: Start with liquid foods such as gelatin or soup. Progress to regular foods as tolerated. Avoid greasy, spicy, heavy foods. If nausea and/or vomiting occur, drink only clear liquids until the nausea and/or vomiting subsides. Call your physician if vomiting continues.  Special Instructions/Symptoms: Your throat may feel dry or sore from the anesthesia or the breathing tube placed in your throat during surgery. If this causes discomfort, gargle with warm salt water. The discomfort should disappear within 24 hours.  If you had a scopolamine patch placed behind your ear for the management of post- operative nausea and/or vomiting:  1. The medication in the patch is effective for 72 hours, after which it should be removed.  Wrap patch in a tissue and discard in the trash. Wash hands thoroughly with soap and water. 2. You may remove the patch earlier than 72 hours if you experience unpleasant side effects which may include dry mouth, dizziness or visual disturbances. 3. Avoid touching the patch. Wash your hands with soap and water after contact with the patch.    General Anesthesia, Adult, Care After Refer to this sheet in the next few weeks. These instructions provide you with information on caring for yourself after your procedure. Your health care provider may also give you more specific instructions. Your treatment has been planned according to current medical practices, but problems sometimes occur. Call your health care provider if you have any problems or  questions after your procedure. WHAT TO EXPECT AFTER THE PROCEDURE After the procedure, it is typical to experience:  Sleepiness.  Nausea and vomiting. HOME CARE INSTRUCTIONS  For the first 24 hours after general anesthesia:  Have a responsible person with you.  Do not drive a car. If you are alone, do not take public transportation.  Do not drink alcohol.  Do not take medicine that has not been prescribed by your health care provider.  Do not sign important papers or make important decisions.  You may resume a normal diet and activities as directed by your health care provider.  Change bandages (dressings) as directed.  If you have questions or problems that seem related to general anesthesia, call the hospital and ask for the anesthetist or anesthesiologist on call. SEEK MEDICAL CARE IF:  You have nausea and vomiting that continue the day after anesthesia.  You develop a rash. SEEK IMMEDIATE MEDICAL CARE IF:   You have difficulty breathing.  You have chest pain.  You have any allergic problems.   This information is not intended to replace advice given to you by your health care provider. Make sure you discuss any questions you have with your health care provider.   Document Released: 05/02/2000 Document Revised: 02/14/2014 Document Reviewed: 05/25/2011 Elsevier Interactive Patient Education [employer].

## 2015-05-06 NOTE — Transfer of Care (Signed)
Immediate Anesthesia Transfer of Care Note  Patient: [name]  Procedure(s) Performed: Procedure(s): LAPAROSCOPIC RIGHT INGUINAL HERNIA WITH MESH (Right) INSERTION OF MESH (Right)  Patient Location: PACU  Anesthesia Type:General  Level of Consciousness:  sedated, patient cooperative and responds to stimulation  Airway & Oxygen Therapy:Patient Spontanous Breathing and Patient connected to face mask oxgen  Post-op Assessment:  Report given to PACU RN and Post -op Vital signs reviewed and stable  Post vital signs:  Reviewed and stable  Last Vitals:  Filed Vitals:   05/06/15 0640  BP: 154/78  Pulse: 65  Temp: 36.4 C  Resp: 16    Complications: No apparent anesthesia complications

## 2017-07-07 ENCOUNTER — Encounter (HOSPITAL_COMMUNITY): Payer: Self-pay | Admitting: Emergency Medicine

## 2017-07-07 ENCOUNTER — Other Ambulatory Visit: Payer: Self-pay

## 2017-07-07 ENCOUNTER — Inpatient Hospital Stay (HOSPITAL_COMMUNITY)
Admission: AD | Admit: 2017-07-07 | Discharge: 2017-07-12 | DRG: 287 | Disposition: A | Discharge status: Home / Self Care | Payer: Medicare Other | Source: Ambulatory Visit | Attending: Cardiology | Admitting: Cardiology

## 2017-07-07 DIAGNOSIS — I110 Hypertensive heart disease with heart failure: Secondary | ICD-10-CM | POA: Diagnosis present

## 2017-07-07 DIAGNOSIS — I5023 Acute on chronic systolic (congestive) heart failure: Secondary | ICD-10-CM | POA: Diagnosis present

## 2017-07-07 DIAGNOSIS — D638 Anemia in other chronic diseases classified elsewhere: Secondary | ICD-10-CM | POA: Diagnosis present

## 2017-07-07 DIAGNOSIS — Z01810 Encounter for preprocedural cardiovascular examination: Secondary | ICD-10-CM | POA: Diagnosis not present

## 2017-07-07 DIAGNOSIS — Z951 Presence of aortocoronary bypass graft: Secondary | ICD-10-CM

## 2017-07-07 DIAGNOSIS — I2510 Atherosclerotic heart disease of native coronary artery without angina pectoris: Secondary | ICD-10-CM | POA: Diagnosis present

## 2017-07-07 DIAGNOSIS — Z87891 Personal history of nicotine dependence: Secondary | ICD-10-CM

## 2017-07-07 DIAGNOSIS — I472 Ventricular tachycardia: Secondary | ICD-10-CM | POA: Diagnosis present

## 2017-07-07 DIAGNOSIS — K761 Chronic passive congestion of liver: Secondary | ICD-10-CM | POA: Diagnosis present

## 2017-07-07 DIAGNOSIS — R0602 Shortness of breath: Secondary | ICD-10-CM

## 2017-07-07 DIAGNOSIS — K219 Gastro-esophageal reflux disease without esophagitis: Secondary | ICD-10-CM | POA: Diagnosis present

## 2017-07-07 DIAGNOSIS — I350 Nonrheumatic aortic (valve) stenosis: Secondary | ICD-10-CM

## 2017-07-07 DIAGNOSIS — E46 Unspecified protein-calorie malnutrition: Secondary | ICD-10-CM | POA: Diagnosis present

## 2017-07-07 DIAGNOSIS — I428 Other cardiomyopathies: Secondary | ICD-10-CM | POA: Diagnosis present

## 2017-07-07 DIAGNOSIS — I361 Nonrheumatic tricuspid (valve) insufficiency: Secondary | ICD-10-CM | POA: Diagnosis not present

## 2017-07-07 DIAGNOSIS — I340 Nonrheumatic mitral (valve) insufficiency: Secondary | ICD-10-CM

## 2017-07-07 DIAGNOSIS — E871 Hypo-osmolality and hyponatremia: Secondary | ICD-10-CM | POA: Diagnosis present

## 2017-07-07 DIAGNOSIS — E8809 Other disorders of plasma-protein metabolism, not elsewhere classified: Secondary | ICD-10-CM | POA: Diagnosis present

## 2017-07-07 DIAGNOSIS — I493 Ventricular premature depolarization: Secondary | ICD-10-CM | POA: Diagnosis present

## 2017-07-07 DIAGNOSIS — I080 Rheumatic disorders of both mitral and aortic valves: Secondary | ICD-10-CM | POA: Diagnosis present

## 2017-07-07 DIAGNOSIS — D509 Iron deficiency anemia, unspecified: Secondary | ICD-10-CM | POA: Diagnosis present

## 2017-07-07 DIAGNOSIS — I5020 Unspecified systolic (congestive) heart failure: Secondary | ICD-10-CM

## 2017-07-07 DIAGNOSIS — I272 Pulmonary hypertension, unspecified: Secondary | ICD-10-CM | POA: Diagnosis present

## 2017-07-07 DIAGNOSIS — I509 Heart failure, unspecified: Secondary | ICD-10-CM | POA: Diagnosis present

## 2017-07-07 DIAGNOSIS — I5030 Unspecified diastolic (congestive) heart failure: Secondary | ICD-10-CM | POA: Diagnosis present

## 2017-07-07 HISTORY — DX: Atherosclerotic heart disease of native coronary artery without angina pectoris: I25.10

## 2017-07-07 HISTORY — DX: Anemia, unspecified: D64.9

## 2017-07-07 HISTORY — DX: Nonrheumatic aortic (valve) stenosis: I35.0

## 2017-07-07 HISTORY — DX: Unspecified systolic (congestive) heart failure: I50.20

## 2017-07-07 HISTORY — DX: Nonrheumatic mitral (valve) insufficiency: I34.0

## 2017-07-07 LAB — COMPREHENSIVE METABOLIC PANEL
ALT: 93 U/L — ABNORMAL HIGH (ref 17–63)
AST: 83 U/L — ABNORMAL HIGH (ref 15–41)
Albumin: 2.7 g/dL — ABNORMAL LOW (ref 3.5–5.0)
Alkaline Phosphatase: 92 U/L (ref 38–126)
Anion gap: 7 (ref 5–15)
BUN: 16 mg/dL (ref 6–20)
CO2: 26 mmol/L (ref 22–32)
Calcium: 8.6 mg/dL — ABNORMAL LOW (ref 8.9–10.3)
Chloride: 96 mmol/L — ABNORMAL LOW (ref 101–111)
Creatinine, Ser: 0.95 mg/dL (ref 0.61–1.24)
GFR calc Af Amer: >60 mL/min (ref >60)
GFR calc non Af Amer: >60 mL/min (ref >60)
Glucose, Bld: 120 mg/dL — ABNORMAL HIGH (ref 65–99)
Potassium: 4.3 mmol/L (ref 3.5–5.1)
Sodium: 129 mmol/L — ABNORMAL LOW (ref 135–145)
Total Bilirubin: 0.8 mg/dL (ref 0.3–1.2)
Total Protein: 5.7 g/dL — ABNORMAL LOW (ref 6.5–8.1)

## 2017-07-07 LAB — CBC WITH DIFFERENTIAL/PLATELET
Abs Immature Granulocytes: 0 10*3/uL (ref 0.0–0.1)
Basophils Absolute: 0 10*3/uL (ref 0.0–0.1)
Basophils Relative: 1 %
Eosinophils Absolute: 0.1 10*3/uL (ref 0.0–0.7)
Eosinophils Relative: 2 %
HCT: 31.7 % — ABNORMAL LOW (ref 39.0–52.0)
Hemoglobin: 10.4 g/dL — ABNORMAL LOW (ref 13.0–17.0)
Immature Granulocytes: 0 %
Lymphocytes Relative: 8 %
Lymphs Abs: 0.5 10*3/uL — ABNORMAL LOW (ref 0.7–4.0)
MCH: 30.1 pg (ref 26.0–34.0)
MCHC: 32.8 g/dL (ref 30.0–36.0)
MCV: 91.9 fL (ref 78.0–100.0)
Monocytes Absolute: 0.6 10*3/uL (ref 0.1–1.0)
Monocytes Relative: 10 %
Neutro Abs: 4.9 10*3/uL (ref 1.7–7.7)
Neutrophils Relative %: 79 %
Platelets: 387 10*3/uL (ref 150–400)
RBC: 3.45 MIL/uL — ABNORMAL LOW (ref 4.22–5.81)
RDW: 13.5 % (ref 11.5–15.5)
WBC: 6.1 10*3/uL (ref 4.0–10.5)

## 2017-07-07 LAB — MRSA PCR SCREENING: MRSA by PCR: NEGATIVE

## 2017-07-07 MED ORDER — ONDANSETRON HCL 4 MG/2ML IJ SOLN: 4.0000 mg | Freq: Four times a day (QID) | INTRAMUSCULAR | Status: DC | PRN

## 2017-07-07 MED ORDER — SODIUM CHLORIDE 0.9% FLUSH
3.0000 mL | Freq: Two times a day (BID) | INTRAVENOUS | Status: DC
  Administered 2017-07-07 – 2017-07-12 (×9): 3 mL via INTRAVENOUS

## 2017-07-07 MED ORDER — FUROSEMIDE 10 MG/ML IJ SOLN
20.0000 mg | Freq: Two times a day (BID) | INTRAMUSCULAR | Status: DC
  Administered 2017-07-07 – 2017-07-08 (×2): 20 mg via INTRAVENOUS
  Filled 2017-07-07 (×2): qty 2

## 2017-07-07 MED ORDER — SODIUM CHLORIDE 0.9% FLUSH: 3.0000 mL | INTRAVENOUS | Status: DC | PRN

## 2017-07-07 MED ORDER — ACETAMINOPHEN 325 MG PO TABS: 650.0000 mg | ORAL_TABLET | ORAL | Status: DC | PRN

## 2017-07-07 MED ORDER — HEPARIN SODIUM (PORCINE) 5000 UNIT/ML IJ SOLN
5000.0000 [IU] | Freq: Three times a day (TID) | INTRAMUSCULAR | Status: DC
  Administered 2017-07-07 – 2017-07-12 (×15): 5000 [IU] via SUBCUTANEOUS
  Filled 2017-07-07 (×15): qty 1

## 2017-07-07 MED ORDER — SODIUM CHLORIDE 0.9 % IV SOLN
250.0000 mL | INTRAVENOUS | Status: DC | PRN
Start: 2017-07-07 — End: 2017-07-12

## 2017-07-07 NOTE — Care Management Note (Signed)
Case Management Note  Patient Details  Name: Koltin Wehmeyer MRN: [id number] Date of Birth: 03-18-1940  Subjective/Objective:      Pt admitted weakness and SOB              Action/Plan:   PTA independent from home with wife.  Pt has PCP and denied barriers to obtaining/paying for your medications.  Pt educated on importance of daily weights and low salt diet.   Expected Discharge Date:                  Expected Discharge Plan:  Home/Self Care  In-House Referral:     Discharge planning Services  CM Consult  Post Acute Care Choice:    Choice offered to:     DME Arranged:    DME Agency:     HH Arranged:    HH Agency:     Status of Service:     If discussed at [employer] of Stay Meetings, dates discussed:    Additional Comments:  Maryclare Labrador, RN 07/07/2017, 4:32 PM

## 2017-07-07 NOTE — H&P (Addendum)
[name] is an 77 y.o. male.   Chief Complaint: Shortness of breath HPI:   77 year old Caucasian male with hypertension, now with worsening shortness of breath and leg edema for the last few weeks.  Patient is very active at baseline and rides bicycle regularly.  Her last few weeks, he is noticed significant decrease in his exercise capacity.  Outpatient workup showed LVEF 35-40%, with moderate to severe aortic stenosis and moderate mitral regurgitation, and it was below.  Patient was seen in the office today.  He was found to have worsening leg edema as well as bilateral lung rales.  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 BNP was elevated at 858 on 06/27/2017.  Office Echocardiogram 06/29/2017: Left ventricle cavity is normal in size. Mild concentric hypertrophy of the left ventricle. Moderate decrease in LV systolic function with global hypokinesis. No regional wall motion abnormality. Visual EF is 35-40%. Calculated EF 40%. Left atrial cavity is severely dilated at 4.5 cm.  Right atrial cavity is severely dilated. No aortic valve regurgitation noted. Moderate calcification of the aortic valve with severely restricted aortic valve leaflets motion and RCC is fixated. 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 Aortic valve peak pressure gradient of 40 and mean gradient of 25 mmHg, calculated aortic valve area 0.69 cm. Moderate (Grade III) mitral regurgitation. Mild tricuspid regurgitation. Mild pulmonary hypertension. Estimated pulmonary artery systolic pressure  31 mmHg Mild pulmonic regurgitation. 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  Past Medical History:  Diagnosis Date  . Gout   . Hepatitis 1964   hepatitis A   . Hypertension     Past Surgical History:  Procedure Laterality Date  . EYE SURGERY Bilateral    lens  replacements for cataracts  . HERNIA REPAIR    . INGUINAL HERNIA REPAIR Right 05/06/2015   Procedure: LAPAROSCOPIC RIGHT INGUINAL HERNIA WITH MESH;  Surgeon: Coralie Keens, MD;  Location: [facility];  Service: General;  Laterality: Right;  . INSERTION OF MESH Right 05/06/2015   Procedure: INSERTION OF MESH;  Surgeon: Coralie Keens, MD;  Location: [facility];  Service: General;  Laterality: Right;    Family History  Problem Relation Age of Onset  . Stroke Mother   . Heart attack Father    Social History:  reports that he has quit smoking. He has never used smokeless tobacco. He reports that he drinks alcohol. He reports that he does not use drugs.  Allergies: No Known Allergies  Medications Prior to Admission  Medication Sig Dispense Refill  . allopurinol (ZYLOPRIM) 100 MG tablet Take 200 mg by mouth every morning.    Marland Kitchen amLODipine (NORVASC) 2.5 MG tablet Take 2.5 mg by mouth daily.  3  . HYDROcodone-acetaminophen (NORCO) 5-325 MG tablet Take 1-2 tablets by mouth every 4 (four) hours as needed. 30 tablet 0  . ibuprofen (ADVIL,MOTRIN) 200 MG tablet Take 400 mg by mouth every 6 (six) hours as needed (Pain).      Results for orders placed or performed during the hospital encounter of 07/07/17 (from the past 48 hour(s))  CBC WITH DIFFERENTIAL     Status: Abnormal   Collection Time: 07/07/17  2:47 PM  Result Value Ref Range   WBC 6.1 4.0 - 10.5 K/uL   RBC 3.45 (L) 4.22 - 5.81 MIL/uL   Hemoglobin 10.4 (L) 13.0 - 17.0 g/dL   HCT 31.7 (L) 39.0 -  52.0 %   MCV 91.9 78.0 - 100.0 fL   MCH 30.1 26.0 - 34.0 pg   MCHC 32.8 30.0 - 36.0 g/dL   RDW 13.5 11.5 - 15.5 %   Platelets 387 150 - 400 K/uL   Neutrophils Relative % 79 %   Neutro Abs 4.9 1.7 - 7.7 K/uL   Lymphocytes Relative 8 %   Lymphs Abs 0.5 (L) 0.7 - 4.0 K/uL   Monocytes Relative 10 %   Monocytes Absolute 0.6 0.1 - 1.0 K/uL   Eosinophils Relative 2 %   Eosinophils Absolute 0.1 0.0 - 0.7 K/uL   Basophils Relative 1 %   Basophils  Absolute 0.0 0.0 - 0.1 K/uL   Immature Granulocytes 0 %   Abs Immature Granulocytes 0.0 0.0 - 0.1 K/uL    Comment: Performed at [facility] [address], [place], [place] [address]  Comprehensive metabolic panel     Status: Abnormal   Collection Time: 07/07/17  2:47 PM  Result Value Ref Range   Sodium 129 (L) 135 - 145 mmol/L   Potassium 4.3 3.5 - 5.1 mmol/L   Chloride 96 (L) 101 - 111 mmol/L   CO2 26 22 - 32 mmol/L   Glucose, Bld 120 (H) 65 - 99 mg/dL   BUN 16 6 - 20 mg/dL   Creatinine, Ser 0.95 0.61 - 1.24 mg/dL   Calcium 8.6 (L) 8.9 - 10.3 mg/dL   Total Protein 5.7 (L) 6.5 - 8.1 g/dL   Albumin 2.7 (L) 3.5 - 5.0 g/dL   AST 83 (H) 15 - 41 U/L   ALT 93 (H) 17 - 63 U/L   Alkaline Phosphatase 92 38 - 126 U/L   Total Bilirubin 0.8 0.3 - 1.2 mg/dL   GFR calc non Af Amer >60 >60 mL/min   GFR calc Af Amer >60 >60 mL/min    Comment: (NOTE) The eGFR has been calculated using the CKD EPI equation. This calculation has not been validated in all clinical situations. eGFR's persistently <60 mL/min signify possible Chronic Kidney Disease.    Anion gap 7 5 - 15    Comment: Performed at [facility] [address], [place], [place] [address]   No results found.    Review of Systems  Constitutional: Positive for malaise/fatigue.  HENT: Negative.   Eyes: Negative.   Respiratory: Positive for shortness of breath. Negative for sputum production.   Cardiovascular: Positive for orthopnea, leg swelling and PND. Negative for chest pain and claudication.  Gastrointestinal: Negative for abdominal pain and nausea.  Genitourinary: Negative.   Musculoskeletal: Negative.   Skin: Negative.   Neurological: Positive for dizziness. Negative for loss of consciousness.  Endo/Heme/Allergies: Does not bruise/bleed easily.  Psychiatric/Behavioral: The patient is nervous/anxious.   All other systems reviewed and are negative.   Blood pressure 94/80, pulse 79, temperature 98.3 F (36.8  C), temperature source Oral, resp. rate 18, height 6' (1.829 m), weight 79.8 kg (175 lb 14.8 oz), SpO2 97 %. Physical Exam  Nursing note and vitals reviewed. Constitutional: He is oriented to person, place, and time. He appears well-developed and well-nourished.  Neck: Normal range of motion. Neck supple. JVD present.  Cardiovascular: Normal rate.  Murmur (III/VI crescendo murmur RUSB, III/VI holosystolic murmur apex) heard. Pulses:      Dorsalis pedis pulses are 2+ on the right side, and 2+ on the left side.       Posterior tibial pulses are 2+ on the right side, and 2+  on the left side.  Carotid bruit, possible conducted aortic murmur  Respiratory: Effort normal. He has rales.  GI: Soft. Bowel sounds are normal. He exhibits distension. There is no tenderness.  Musculoskeletal: He exhibits edema (2+ b/l).  Lymphadenopathy:    He has no cervical adenopathy.  Neurological: He is alert and oriented to person, place, and time. No cranial nerve deficit.  Skin: Skin is warm and dry.  Psychiatric: He has a normal mood and affect.   EKG 07/07/2017: Sinus arrhtymia. 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 Plan on performing right and left heart cath with coronary angiography on Monday 07/10/2017. Given incongruent data on echocardiogram, will perform invasive hemodynamic assessment of aortic valve with simultaneous LV-Ao measurement. Will also plan on performing TEE on Tuesday 07/11/2017. He will need heart team discussion for management of valvular cardiomyopathy.    Nigel Mormon, MD 07/07/2017, 4:13 PM  [place], MD [facility] Pager: [phone] Office: [phone] If no  answer Cell [phone]

## 2017-07-08 ENCOUNTER — Inpatient Hospital Stay (HOSPITAL_COMMUNITY): Payer: Medicare Other

## 2017-07-08 LAB — BASIC METABOLIC PANEL
Anion gap: 8 (ref 5–15)
BUN: 14 mg/dL (ref 6–20)
CO2: 26 mmol/L (ref 22–32)
Calcium: 8.4 mg/dL — ABNORMAL LOW (ref 8.9–10.3)
Chloride: 93 mmol/L — ABNORMAL LOW (ref 101–111)
Creatinine, Ser: 0.95 mg/dL (ref 0.61–1.24)
GFR calc Af Amer: >60 mL/min (ref >60)
GFR calc non Af Amer: >60 mL/min (ref >60)
Glucose, Bld: 99 mg/dL (ref 65–99)
Potassium: 4.2 mmol/L (ref 3.5–5.1)
Sodium: 127 mmol/L — ABNORMAL LOW (ref 135–145)

## 2017-07-08 LAB — HEMOGLOBIN A1C
Hgb A1c MFr Bld: 5.5 % (ref 4.8–5.6)
Mean Plasma Glucose: 111.15 mg/dL

## 2017-07-08 LAB — LIPID PANEL
Cholesterol: 155 mg/dL (ref 0–200)
HDL: 26 mg/dL — ABNORMAL LOW (ref >40)
LDL Cholesterol: 114 mg/dL — ABNORMAL HIGH (ref 0–99)
Total CHOL/HDL Ratio: 6 RATIO
Triglycerides: 76 mg/dL (ref <150)
VLDL: 15 mg/dL (ref 0–40)

## 2017-07-08 LAB — OCCULT BLOOD X 1 CARD TO LAB, STOOL: Fecal Occult Bld: NEGATIVE

## 2017-07-08 LAB — IRON AND TIBC
Iron: 38 ug/dL — ABNORMAL LOW (ref 45–182)
Saturation Ratios: 13 % — ABNORMAL LOW (ref 17.9–39.5)
TIBC: 294 ug/dL (ref 250–450)
UIBC: 256 ug/dL

## 2017-07-08 LAB — VITAMIN B12: Vitamin B-12: 189 pg/mL (ref 180–914)

## 2017-07-08 LAB — TRANSFERRIN: Transferrin: 210 mg/dL (ref 180–329)

## 2017-07-08 LAB — MAGNESIUM: Magnesium: 1.8 mg/dL (ref 1.7–2.4)

## 2017-07-08 MED ORDER — TAMSULOSIN HCL 0.4 MG PO CAPS
0.4000 mg | ORAL_CAPSULE | Freq: Every day | ORAL | Status: DC
Start: 2017-07-08 — End: 2017-07-12
  Administered 2017-07-08 – 2017-07-11 (×4): 0.4 mg via ORAL
  Filled 2017-07-08 (×4): qty 1

## 2017-07-08 MED ORDER — MAGNESIUM SULFATE 2 GM/50ML IV SOLN
2.0000 g | Freq: Once | INTRAVENOUS | Status: AC
  Administered 2017-07-08: 2 g via INTRAVENOUS
  Filled 2017-07-08: qty 50

## 2017-07-08 MED ORDER — FUROSEMIDE 10 MG/ML IJ SOLN
20.0000 mg | Freq: Three times a day (TID) | INTRAMUSCULAR | Status: DC
  Administered 2017-07-08 – 2017-07-10 (×7): 20 mg via INTRAVENOUS
  Filled 2017-07-08 (×7): qty 2

## 2017-07-08 MED ORDER — PROMETHAZINE HCL 6.25 MG/5ML PO SYRP
6.2500 mg | ORAL_SOLUTION | Freq: Four times a day (QID) | ORAL | Status: DC | PRN
  Filled 2017-07-08: qty 5

## 2017-07-08 MED ORDER — DEXTROMETHORPHAN POLISTIREX ER 30 MG/5ML PO SUER
15.0000 mg | Freq: Four times a day (QID) | ORAL | Status: DC | PRN
  Administered 2017-07-08 – 2017-07-10 (×2): 15 mg via ORAL
  Filled 2017-07-08 (×2): qty 5

## 2017-07-08 MED ORDER — PRO-STAT SUGAR FREE PO LIQD
30.0000 mL | Freq: Three times a day (TID) | ORAL | Status: DC
Start: 2017-07-08 — End: 2017-07-12
  Administered 2017-07-08 – 2017-07-12 (×10): 30 mL via ORAL
  Filled 2017-07-08 (×11): qty 30

## 2017-07-08 NOTE — Progress Notes (Addendum)
Subjective:  Breathing marginally improved. Walking in the hallway this morning withiut much difficulty  Objective:  Vital Signs in the last 24 hours: Temp:  [97.5 F (36.4 C)-98.3 F (36.8 C)] 97.6 F (36.4 C) (06/01 0818) Pulse Rate:  [76-88] 79 (06/01 0818) Resp:  [16-25] 16 (06/01 0818) BP: (94-123)/(79-91) 119/79 (06/01 0818) SpO2:  [97 %-99 %] 99 % (06/01 0305) Weight:  [79.8 kg (175 lb 14.8 oz)-80 kg (176 lb 5.9 oz)] 80 kg (176 lb 5.9 oz) (06/01 0305)   Multiple PVC's and brief NSVT's  Intake/Output from previous day: 05/31 0701 - 06/01 0700 In: 480 [P.O.:480] Out: 500 [Urine:500] Intake/Output from this shift: Total I/O In: 240 [P.O.:240] Out: 400 [Urine:400]  Physical Exam: Nursing note and vitals reviewed. Constitutional: He is oriented to person, place, and time. He appears well-developed and well-nourished.  Neck: Normal range of motion. Neck supple. JVD present.  Cardiovascular: Normal rate.  Murmur (III/VI crescendo murmur RUSB, III/VI holosystolic murmur apex) heard. Pulses:      Dorsalis pedis pulses are 2+ on the right side, and 2+ on the left side.       Posterior tibial pulses are 2+ on the right side, and 2+ on the left side.  Carotid bruit, possible conducted aortic murmur  Respiratory: Effort normal. He has rales.  GI: Soft. Bowel sounds are normal. He exhibits distension. There is no tenderness.  Musculoskeletal: He exhibits edema (2+ b/l).  Lymphadenopathy:    He has no cervical adenopathy.  Neurological: He is alert and oriented to person, place, and time. No cranial nerve deficit.  Skin: Skin is warm and dry.  Psychiatric: He has a normal mood and affect.    Lab Results: Recent Labs    07/07/17 1447  WBC 6.1  HGB 10.4*  PLT 387   Recent Labs    07/07/17 1447 07/08/17 0341  NA 129* 127*  K 4.3 4.2  CL 96* 93*  CO2 26 26  GLUCOSE 120* 99  BUN 16 14  CREATININE 0.95 0.95   No results for input(s): TROPONINI in the last 72  hours.  Invalid input(s): CK, MB Hepatic Function Panel Recent Labs    07/07/17 1447  PROT 5.7*  ALBUMIN 2.7*  AST 83*  ALT 93*  ALKPHOS 92  BILITOT 0.8   Recent Labs    07/08/17 0341  CHOL 155    EKG 07/07/2017: Sinus arrhtymia. Frequent PVC's  Office Echocardiogram 06/29/2017: Left ventricle cavity is normal in size. Mild concentric hypertrophy of the left ventricle. Moderate decrease in LV systolic function with global hypokinesis. No regional wall motion abnormality. Visual EF is 35-40%. Calculated EF 40%. Left atrial cavity is severely dilated at 4.5 cm.  Right atrial cavity is severely dilated. No aortic valve regurgitation noted. Moderate calcification of the aortic valve with severely restricted aortic valve leaflets motion and RCC is fixated. 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 Aortic valve peak pressure gradient of 40 and mean gradient of 25 mmHg, calculated aortic valve area 0.69 cm. Moderate (Grade III) mitral regurgitation. Mild tricuspid regurgitation. Mild pulmonary hypertension. Estimated pulmonary artery systolic pressure 31 mmHg Mild pulmonic regurgitation. 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 Likely anemia of chronic disease.  Plan: Change status to telemetry. Increase IV lasix to 20 mg tid.  Keep K at 4, Mg at 2 Dietitian consult Will check iron, ferritin, transferrin, B12, occult blood Plan on performing right and left heart cath with coronary angiography on Monday 07/10/2017. Given incongruent data on  echocardiogram, will perform invasive hemodynamic assessment of aortic valve with simultaneous LV-Ao measurement. Will also plan on performing TEE on Tuesday 07/11/2017. He will need heart team discussion for management of valvular cardiomyopathy.     LOS: 1 day    Nieshia Larmon J Tremayne Sheldon 07/08/2017, 9:50 AM  [place], MD [facility]. [place] Pager: [phone] Office: [phone] If no answer Cell [phone]

## 2017-07-08 NOTE — Progress Notes (Signed)
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 To undergo procedure next week. .   RD commented he had been consulted due to concerns of malnutrition. He reports "I would certainly know if I was malnourished, I am an athlete". He reports exercising at the gym 3x a week, playing disc gold each week as well as riding his bicycle routinely.   Diet wise, he has no change in his PO intake. He is a quasi-vegetarian, or what he calls a "flexi-tarian". He eats "mostly vegetables, whole grains and minimally processed items". He makes sure to get protein from tofu, legumes, eggs and some meats. He does not take any vitamin, protein supplements, stating "I egt it all from food".   Weight wise, he reports no UNINTENTIONAL weight loss. He reports losing from 180 to 170 on purpose, by making healthy lifestyle habits.   Patient is in extremely good shape for his age. He notes this is the "first time he has ever been sick". He eats a healthy diet and makes sure to obtain protein.   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 Feel that albumin is much more likely related to the nature of his cardiovascular disease process than his diet.    Note that low albumin is no longer used to diagnose malnutrition; [facility] uses the new malnutrition guidelines published by the American Society for Parenteral and Enteral Nutrition (A.S.P.E.N.) and the Academy of Nutrition and Dietetics (AND).     He was agreeable to prostat given increased needs. He is eating 80-100% meals.  No nutrition interventions warranted at this time. If nutrition issues arise, please consult RD.   Burtis Junes RD, LDN, CNSC Clinical Nutrition Available Tues-Sat via Pager: [phone] 07/08/2017 12:11 PM

## 2017-07-09 LAB — CBC WITH DIFFERENTIAL/PLATELET
Abs Immature Granulocytes: 0 10*3/uL (ref 0.0–0.1)
Basophils Absolute: 0 10*3/uL (ref 0.0–0.1)
Basophils Relative: 1 %
Eosinophils Absolute: 0.1 10*3/uL (ref 0.0–0.7)
Eosinophils Relative: 1 %
HCT: 33.2 % — ABNORMAL LOW (ref 39.0–52.0)
Hemoglobin: 11.1 g/dL — ABNORMAL LOW (ref 13.0–17.0)
Immature Granulocytes: 0 %
Lymphocytes Relative: 7 %
Lymphs Abs: 0.4 10*3/uL — ABNORMAL LOW (ref 0.7–4.0)
MCH: 30.7 pg (ref 26.0–34.0)
MCHC: 33.4 g/dL (ref 30.0–36.0)
MCV: 92 fL (ref 78.0–100.0)
Monocytes Absolute: 0.5 10*3/uL (ref 0.1–1.0)
Monocytes Relative: 10 %
Neutro Abs: 4.3 10*3/uL (ref 1.7–7.7)
Neutrophils Relative %: 81 %
Platelets: 371 10*3/uL (ref 150–400)
RBC: 3.61 MIL/uL — ABNORMAL LOW (ref 4.22–5.81)
RDW: 13.3 % (ref 11.5–15.5)
WBC: 5.3 10*3/uL (ref 4.0–10.5)

## 2017-07-09 LAB — BASIC METABOLIC PANEL
Anion gap: 8 (ref 5–15)
BUN: 17 mg/dL (ref 6–20)
CO2: 27 mmol/L (ref 22–32)
Calcium: 8.5 mg/dL — ABNORMAL LOW (ref 8.9–10.3)
Chloride: 92 mmol/L — ABNORMAL LOW (ref 101–111)
Creatinine, Ser: 0.92 mg/dL (ref 0.61–1.24)
GFR calc Af Amer: >60 mL/min (ref >60)
GFR calc non Af Amer: >60 mL/min (ref >60)
Glucose, Bld: 101 mg/dL — ABNORMAL HIGH (ref 65–99)
Potassium: 4 mmol/L (ref 3.5–5.1)
Sodium: 127 mmol/L — ABNORMAL LOW (ref 135–145)

## 2017-07-09 MED ORDER — DOCUSATE SODIUM 100 MG PO CAPS
100.0000 mg | ORAL_CAPSULE | Freq: Two times a day (BID) | ORAL | Status: DC | PRN
  Administered 2017-07-09 – 2017-07-12 (×2): 100 mg via ORAL
  Filled 2017-07-09 (×2): qty 1

## 2017-07-09 MED ORDER — POTASSIUM CHLORIDE CRYS ER 20 MEQ PO TBCR
20.0000 meq | EXTENDED_RELEASE_TABLET | Freq: Two times a day (BID) | ORAL | Status: DC
Start: 2017-07-09 — End: 2017-07-12
  Administered 2017-07-09 – 2017-07-11 (×4): 20 meq via ORAL
  Filled 2017-07-09 (×4): qty 1

## 2017-07-09 MED ORDER — SODIUM CHLORIDE 0.9 % IV SOLN
510.0000 mg | INTRAVENOUS | Status: AC
  Administered 2017-07-09 – 2017-07-12 (×2): 510 mg via INTRAVENOUS
  Filled 2017-07-09 (×3): qty 17

## 2017-07-09 MED ORDER — PANTOPRAZOLE SODIUM 20 MG PO TBEC
20.0000 mg | DELAYED_RELEASE_TABLET | Freq: Every day | ORAL | Status: DC
  Administered 2017-07-09 – 2017-07-11 (×2): 20 mg via ORAL
  Filled 2017-07-09 (×2): qty 1

## 2017-07-09 NOTE — Progress Notes (Signed)
Subjective:  Patient is in great spirits this morning. Significant improvement in his cough, breathing, and energy level  Objective:  Vital Signs in the last 24 hours: Temp:  [97.7 F (36.5 C)-98.2 F (36.8 C)] 98.2 F (36.8 C) (06/02 0747) Pulse Rate:  [69-87] 87 (06/02 0747) Resp:  [16-21] 18 (06/02 0747) BP: (97-134)/(61-84) 117/84 (06/02 0747) SpO2:  [93 %-100 %] 93 % (06/02 0747) Weight:  [79.7 kg (175 lb 11.3 oz)] 79.7 kg (175 lb 11.3 oz) (06/02 0543)   Occasional PVC's  Intake/Output from previous day: 06/01 0701 - 06/02 0700 In: 1320 [P.O.:1320] Out: 4175 [Urine:4175] Intake/Output from this shift: Total I/O In: 3 [I.V.:3] Out: 100 [Urine:100]  Physical Exam: Nursing note and vitals reviewed. Constitutional: He is oriented to person, place, and time. He appears well-developed and well-nourished.  Neck: Normal range of motion. Neck supple. JVD present.  Cardiovascular: Normal rate.  Murmur (III/VI crescendo murmur RUSB, III/VI holosystolic murmur apex) heard. Pulses:      Dorsalis pedis pulses are 2+ on the right side, and 2+ on the left side.       Posterior tibial pulses are 2+ on the right side, and 2+ on the left side.  Carotid bruit, possible conducted aortic murmur  Respiratory: Effort normal. He has rales.  GI: Soft. Bowel sounds are normal. He exhibits distension. There is no tenderness.  Musculoskeletal: He exhibits edema (2+ b/l).  Lymphadenopathy:    He has no cervical adenopathy.  Neurological: He is alert and oriented to person, place, and time. No cranial nerve deficit.  Skin: Skin is warm and dry.  Psychiatric: He has a normal mood and affect.    Lab Results: Recent Labs    07/07/17 1447  WBC 6.1  HGB 10.4*  PLT 387   Recent Labs    07/08/17 0341 07/09/17 0232  NA 127* 127*  K 4.2 4.0  CL 93* 92*  CO2 26 27  GLUCOSE 99 101*  BUN 14 17  CREATININE 0.95 0.92   No results for input(s): TROPONINI in the last 72 hours.  Invalid  input(s): CK, MB Hepatic Function Panel Recent Labs    07/07/17 1447  PROT 5.7*  ALBUMIN 2.7*  AST 83*  ALT 93*  ALKPHOS 92  BILITOT 0.8   Recent Labs    07/08/17 0341  CHOL 155    EKG 07/07/2017: Sinus arrhtymia. Frequent PVC's  Office Echocardiogram 06/29/2017: Left ventricle cavity is normal in size. Mild concentric hypertrophy of the left ventricle. Moderate decrease in LV systolic function with global hypokinesis. No regional wall motion abnormality. Visual EF is 35-40%. Calculated EF 40%. Left atrial cavity is severely dilated at 4.5 cm.  Right atrial cavity is severely dilated. No aortic valve regurgitation noted. Moderate calcification of the aortic valve with severely restricted aortic valve leaflets motion and RCC is fixated. 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 Aortic valve peak pressure gradient of 40 and mean gradient of 25 mmHg, calculated aortic valve area 0.69 cm. Moderate (Grade III) mitral regurgitation. Mild tricuspid regurgitation. Mild pulmonary hypertension. Estimated pulmonary artery systolic pressure 31 mmHg Mild pulmonic regurgitation. 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 Combination of iron  deficiency and anemia of chronic disease. Transferrin sat 13%  Plan: Continue IV lasix to 20 mg tid.  Keep K at 4, Mg at 2 Appreciate dietitian consult Start IV iron q72 hr Plan on performing right and left heart cath with coronary angiography on Monday 07/10/2017. Given incongruent data on echocardiogram, will  perform invasive hemodynamic assessment of aortic valve with simultaneous LV-Ao measurement. Will also plan on performing TEE on Tuesday 07/11/2017. He will need heart team discussion for management of valvular cardiomyopathy.     LOS: 2 days    Judiann Celia J Claire Dolores 07/09/2017, 10:59 AM  [place], MD [facility] [place] Pager: [phone] Office: [phone] If no answer Cell [phone]

## 2017-07-09 NOTE — Progress Notes (Signed)
Patient ambulated in the unit, and understood well that he is going to go to Heart cath tomorrow. Pt watched education video of catheterization. Pt's wife came to see him in the morning. Pt denies any pain and resting in the bed at this time. HS [employer]

## 2017-07-09 NOTE — H&P (View-Only) (Signed)
Patient ambulated in the unit, and understood well that he is going to go to Heart cath tomorrow. Pt watched education video of catheterization. Pt's wife came to see him in the morning. Pt denies any pain and resting in the bed at this time. HS [employer]

## 2017-07-10 ENCOUNTER — Other Ambulatory Visit: Payer: Self-pay | Admitting: *Deleted

## 2017-07-10 ENCOUNTER — Encounter (HOSPITAL_COMMUNITY): Payer: Self-pay | Admitting: Physician Assistant

## 2017-07-10 ENCOUNTER — Ambulatory Visit (HOSPITAL_COMMUNITY): Admission: RE | Admit: 2017-07-10 | Payer: Medicare Other | Source: Ambulatory Visit | Admitting: Cardiology

## 2017-07-10 ENCOUNTER — Encounter (HOSPITAL_COMMUNITY)
Admission: AD | Disposition: A | Discharge status: Home / Self Care | Payer: Self-pay | Source: Ambulatory Visit | Attending: Cardiology

## 2017-07-10 DIAGNOSIS — I350 Nonrheumatic aortic (valve) stenosis: Secondary | ICD-10-CM

## 2017-07-10 DIAGNOSIS — I2510 Atherosclerotic heart disease of native coronary artery without angina pectoris: Secondary | ICD-10-CM

## 2017-07-10 DIAGNOSIS — I340 Nonrheumatic mitral (valve) insufficiency: Secondary | ICD-10-CM

## 2017-07-10 DIAGNOSIS — I361 Nonrheumatic tricuspid (valve) insufficiency: Secondary | ICD-10-CM

## 2017-07-10 HISTORY — PX: RIGHT/LEFT HEART CATH AND CORONARY ANGIOGRAPHY: CATH118266

## 2017-07-10 HISTORY — PX: ULTRASOUND GUIDANCE FOR VASCULAR ACCESS: SHX6516

## 2017-07-10 LAB — POCT I-STAT 3, VENOUS BLOOD GAS (G3P V)
Acid-Base Excess: 4 mmol/L — ABNORMAL HIGH (ref 0.0–2.0)
Acid-Base Excess: 3 mmol/L — ABNORMAL HIGH (ref 0.0–2.0)
Acid-Base Excess: 4 mmol/L — ABNORMAL HIGH (ref 0.0–2.0)
Acid-Base Excess: 4 mmol/L — ABNORMAL HIGH (ref 0.0–2.0)
Bicarbonate: 29.1 mmol/L — ABNORMAL HIGH (ref 20.0–28.0)
Bicarbonate: 28.6 mmol/L — ABNORMAL HIGH (ref 20.0–28.0)
Bicarbonate: 29 mmol/L — ABNORMAL HIGH (ref 20.0–28.0)
Bicarbonate: 29.5 mmol/L — ABNORMAL HIGH (ref 20.0–28.0)
O2 Saturation: 68 %
O2 Saturation: 74 %
O2 Saturation: 74 %
O2 Saturation: 78 %
TCO2: 31 mmol/L (ref 22–32)
TCO2: 30 mmol/L (ref 22–32)
TCO2: 30 mmol/L (ref 22–32)
TCO2: 31 mmol/L (ref 22–32)
pCO2, Ven: 46.4 mmHg (ref 44.0–60.0)
pCO2, Ven: 45.9 mmHg (ref 44.0–60.0)
pCO2, Ven: 46.1 mmHg (ref 44.0–60.0)
pCO2, Ven: 46.3 mmHg (ref 44.0–60.0)
pH, Ven: 7.406 (ref 7.250–7.430)
pH, Ven: 7.398 (ref 7.250–7.430)
pH, Ven: 7.407 (ref 7.250–7.430)
pH, Ven: 7.417 (ref 7.250–7.430)
pO2, Ven: 36 mmHg (ref 32.0–45.0)
pO2, Ven: 40 mmHg (ref 32.0–45.0)
pO2, Ven: 40 mmHg (ref 32.0–45.0)
pO2, Ven: 42 mmHg (ref 32.0–45.0)

## 2017-07-10 LAB — POCT I-STAT 3, ART BLOOD GAS (G3+)
Acid-Base Excess: 3 mmol/L — ABNORMAL HIGH (ref 0.0–2.0)
Acid-Base Excess: 3 mmol/L — ABNORMAL HIGH (ref 0.0–2.0)
Acid-Base Excess: 3 mmol/L — ABNORMAL HIGH (ref 0.0–2.0)
Acid-Base Excess: 3 mmol/L — ABNORMAL HIGH (ref 0.0–2.0)
Bicarbonate: 27.2 mmol/L (ref 20.0–28.0)
Bicarbonate: 27.7 mmol/L (ref 20.0–28.0)
Bicarbonate: 27.7 mmol/L (ref 20.0–28.0)
Bicarbonate: 27.9 mmol/L (ref 20.0–28.0)
O2 Saturation: 93 %
O2 Saturation: 95 %
O2 Saturation: 96 %
O2 Saturation: 97 %
TCO2: 28 mmol/L (ref 22–32)
TCO2: 29 mmol/L (ref 22–32)
TCO2: 29 mmol/L (ref 22–32)
TCO2: 29 mmol/L (ref 22–32)
pCO2 arterial: 40.1 mmHg (ref 32.0–48.0)
pCO2 arterial: 40.6 mmHg (ref 32.0–48.0)
pCO2 arterial: 40.6 mmHg (ref 32.0–48.0)
pCO2 arterial: 42 mmHg (ref 32.0–48.0)
pH, Arterial: 7.44 (ref 7.350–7.450)
pH, Arterial: 7.431 (ref 7.350–7.450)
pH, Arterial: 7.441 (ref 7.350–7.450)
pH, Arterial: 7.442 (ref 7.350–7.450)
pO2, Arterial: 66 mmHg — ABNORMAL LOW (ref 83.0–108.0)
pO2, Arterial: 72 mmHg — ABNORMAL LOW (ref 83.0–108.0)
pO2, Arterial: 80 mmHg — ABNORMAL LOW (ref 83.0–108.0)
pO2, Arterial: 91 mmHg (ref 83.0–108.0)

## 2017-07-10 LAB — BASIC METABOLIC PANEL
Anion gap: 7 (ref 5–15)
BUN: 19 mg/dL (ref 6–20)
CO2: 30 mmol/L (ref 22–32)
Calcium: 9 mg/dL (ref 8.9–10.3)
Chloride: 91 mmol/L — ABNORMAL LOW (ref 101–111)
Creatinine, Ser: 1 mg/dL (ref 0.61–1.24)
GFR calc Af Amer: >60 mL/min (ref >60)
GFR calc non Af Amer: >60 mL/min (ref >60)
Glucose, Bld: 104 mg/dL — ABNORMAL HIGH (ref 65–99)
Potassium: 4.8 mmol/L (ref 3.5–5.1)
Sodium: 128 mmol/L — ABNORMAL LOW (ref 135–145)

## 2017-07-10 LAB — MAGNESIUM: Magnesium: 1.9 mg/dL (ref 1.7–2.4)

## 2017-07-10 SURGERY — RIGHT/LEFT HEART CATH AND CORONARY ANGIOGRAPHY
Anesthesia: LOCAL

## 2017-07-10 MED ORDER — LIDOCAINE HCL (PF) 1 % IJ SOLN
INTRAMUSCULAR | Status: DC | PRN
  Administered 2017-07-10: 1 mL
  Administered 2017-07-10: 2 mL

## 2017-07-10 MED ORDER — HEPARIN (PORCINE) IN NACL 2-0.9 UNITS/ML
INTRAMUSCULAR | Status: DC | PRN
  Administered 2017-07-10: 4 mL via INTRA_ARTERIAL

## 2017-07-10 MED ORDER — HEPARIN (PORCINE) IN NACL 2-0.9 UNITS/ML
INTRAMUSCULAR | Status: AC | PRN
  Administered 2017-07-10 (×2): 500 mL

## 2017-07-10 MED ORDER — VERAPAMIL HCL 2.5 MG/ML IV SOLN
INTRAVENOUS | Status: AC
  Filled 2017-07-10: qty 2

## 2017-07-10 MED ORDER — SODIUM CHLORIDE 0.9% FLUSH: 3.0000 mL | INTRAVENOUS | Status: DC | PRN

## 2017-07-10 MED ORDER — SODIUM CHLORIDE 0.9 % IV SOLN: 250.0000 mL | INTRAVENOUS | Status: DC | PRN

## 2017-07-10 MED ORDER — ASPIRIN 81 MG PO CHEW
81.0000 mg | CHEWABLE_TABLET | ORAL | Status: AC
  Administered 2017-07-10: 81 mg via ORAL
  Filled 2017-07-10: qty 1

## 2017-07-10 MED ORDER — ONDANSETRON HCL 4 MG/2ML IJ SOLN: 4.0000 mg | Freq: Four times a day (QID) | INTRAMUSCULAR | Status: DC | PRN

## 2017-07-10 MED ORDER — IOHEXOL 350 MG/ML SOLN
INTRAVENOUS | Status: DC | PRN
  Administered 2017-07-10: 40 mL via INTRAVENOUS

## 2017-07-10 MED ORDER — HEPARIN (PORCINE) IN NACL 1000-0.9 UT/500ML-% IV SOLN
INTRAVENOUS | Status: AC
  Filled 2017-07-10: qty 1000

## 2017-07-10 MED ORDER — DOBUTAMINE INFUSION FOR EP/ECHO/NUC (1000 MCG/ML)
0.0000 ug/kg/min | INTRAVENOUS | Status: DC
  Administered 2017-07-10: 5 ug/kg/min via INTRAVENOUS
  Filled 2017-07-10: qty 250

## 2017-07-10 MED ORDER — ASPIRIN 81 MG PO CHEW: 81.0000 mg | CHEWABLE_TABLET | ORAL | Status: DC

## 2017-07-10 MED ORDER — SODIUM CHLORIDE 0.9 % IV SOLN: INTRAVENOUS | Status: AC

## 2017-07-10 MED ORDER — SODIUM CHLORIDE 0.9% FLUSH
3.0000 mL | Freq: Two times a day (BID) | INTRAVENOUS | Status: DC
  Administered 2017-07-10 – 2017-07-12 (×4): 3 mL via INTRAVENOUS

## 2017-07-10 MED ORDER — SODIUM CHLORIDE 0.9 % IV SOLN
INTRAVENOUS | Status: DC
  Administered 2017-07-10: 04:00:00 via INTRAVENOUS

## 2017-07-10 MED ORDER — SODIUM CHLORIDE 0.9 % IV SOLN: INTRAVENOUS | Status: DC

## 2017-07-10 MED ORDER — HEPARIN SODIUM (PORCINE) 1000 UNIT/ML IJ SOLN
INTRAMUSCULAR | Status: AC
  Filled 2017-07-10: qty 1

## 2017-07-10 MED ORDER — LIDOCAINE HCL (PF) 1 % IJ SOLN
INTRAMUSCULAR | Status: AC
  Filled 2017-07-10: qty 30

## 2017-07-10 MED ORDER — FUROSEMIDE 10 MG/ML IJ SOLN
20.0000 mg | Freq: Two times a day (BID) | INTRAMUSCULAR | Status: DC
  Administered 2017-07-11 (×2): 20 mg via INTRAVENOUS
  Filled 2017-07-10 (×2): qty 2

## 2017-07-10 MED ORDER — HEPARIN SODIUM (PORCINE) 1000 UNIT/ML IJ SOLN
INTRAMUSCULAR | Status: DC | PRN
  Administered 2017-07-10: 2000 [IU] via INTRAVENOUS
  Administered 2017-07-10: 4000 [IU] via INTRAVENOUS

## 2017-07-10 MED ORDER — SODIUM CHLORIDE 0.9% FLUSH: 3.0000 mL | Freq: Two times a day (BID) | INTRAVENOUS | Status: DC

## 2017-07-10 MED ORDER — ACETAMINOPHEN 325 MG PO TABS: 650.0000 mg | ORAL_TABLET | ORAL | Status: DC | PRN

## 2017-07-10 SURGICAL SUPPLY — 19 items
CATH 5FR JL3.5 JR4 ANG PIG MP (CATHETERS) ×3 IMPLANT
CATH BALLN WEDGE 5F 110CM (CATHETERS) ×3 IMPLANT
CATH INFINITI 5FR AL1 (CATHETERS) ×3 IMPLANT
CATH LANGSTON DUAL LUM PIG 6FR (CATHETERS) ×3 IMPLANT
COVER PRB 48X5XTLSCP FOLD TPE (BAG) ×2 IMPLANT
COVER PROBE 5X48 (BAG) ×1
DEVICE RAD COMP TR BAND LRG (VASCULAR PRODUCTS) ×3 IMPLANT
GLIDESHEATH SLEND A-KIT 6F 22G (SHEATH) ×3 IMPLANT
GUIDEWIRE INQWIRE 1.5J.035X260 (WIRE) ×2 IMPLANT
INQWIRE 1.5J .035X260CM (WIRE) ×3
KIT HEART LEFT (KITS) ×3 IMPLANT
NEEDLE PERC 21GX4CM (NEEDLE) ×3 IMPLANT
PACK CARDIAC CATHETERIZATION (CUSTOM PROCEDURE TRAY) ×3 IMPLANT
SHEATH RAIN 4/5FR (SHEATH) ×3 IMPLANT
TRANSDUCER W/STOPCOCK (MISCELLANEOUS) ×9 IMPLANT
TUBING ART PRESS 72  MALE/FEM (TUBING) ×2
TUBING ART PRESS 72 MALE/FEM (TUBING) ×4 IMPLANT
TUBING CIL FLEX 10 FLL-RA (TUBING) ×3 IMPLANT
WIRE EMERALD ST .035X150CM (WIRE) ×3 IMPLANT

## 2017-07-10 NOTE — Interval H&P Note (Signed)
History and Physical Interval Note:  07/10/2017 8:37 AM  [name]  has presented today for surgery, with the diagnosis of aortic stenosis  The various methods of treatment have been discussed with the patient and family. After consideration of risks, benefits and other options for treatment, the patient has consented to  Procedure(s): RIGHT/LEFT HEART CATH AND CORONARY ANGIOGRAPHY (N/A) as a surgical intervention .  The patient's history has been reviewed, patient examined, no change in status, stable for surgery.  I have reviewed the patient's chart and labs.  Questions were answered to the patient's satisfaction.    2012 Appropriate Use Criteria for Diagnostic Catheterization Home / Select Test of Interest Indication for RHC Valvular Disease Valvular Disease (Right and Left Heart Catheterization or Right Heart Catheterization Alone With or   Valvular Disease  (Right and Left Heart Catheterization or Right Heart Catheterization  Alone With or Without Left Ventriculography and Coronary Angiography) Link Here: [email or web address] Indication:  1. Left ventricular dysfunction out of proportion to the severity of valvular disease A (8) Indication: 72; Score 8     Shahira Fiske J Damauri Minion

## 2017-07-10 NOTE — Progress Notes (Addendum)
Subjective:  Feels better Breathing improved  Objective:  Vital Signs in the last 24 hours: Temp:  [97.6 F (36.4 C)-98.7 F (37.1 C)] 98.7 F (37.1 C) (06/03 2009) Pulse Rate:  [0-85] 84 (06/03 2009) Resp:  [0-64] 18 (06/03 2009) BP: (104-133)/(73-83) 104/75 (06/03 2009) SpO2:  [0 %-100 %] 99 % (06/03 2009) Weight:  [78.5 kg (173 lb 1 oz)] 78.5 kg (173 lb 1 oz) (06/03 0343)   Occasional PVC's  Intake/Output from previous day: 06/02 0701 - 06/03 0700 In: 865 [P.O.:745; I.V.:3; IV Piggyback:117] Out: 2775 [Urine:2775] Intake/Output from this shift: No intake/output data recorded.  Physical Exam: Nursing note and vitals reviewed. Constitutional: He is oriented to person, place, and time. He appears well-developed and well-nourished.  Neck: Normal range of motion. Neck supple. JVD present.  Cardiovascular: Normal rate.  Murmur (III/VI crescendo murmur RUSB, III/VI holosystolic murmur apex) heard. Pulses:      Dorsalis pedis pulses are 2+ on the right side, and 2+ on the left side.       Posterior tibial pulses are 2+ on the right side, and 2+ on the left side.  Carotid bruit, possible conducted aortic murmur  Respiratory: Effort normal. He has rales.  GI: Soft. Bowel sounds are normal. He exhibits distension. There is no tenderness.  Musculoskeletal: He exhibits edema (1+ b/l).  Lymphadenopathy:    He has no cervical adenopathy.  Neurological: He is alert and oriented to person, place, and time. No cranial nerve deficit.  Skin: Skin is warm and dry.  Psychiatric: He has a normal mood and affect.    Lab Results: Recent Labs    07/09/17 1130  WBC 5.3  HGB 11.1*  PLT 371   Recent Labs    07/09/17 0232 07/10/17 0229  NA 127* 128*  K 4.0 4.8  CL 92* 91*  CO2 27 30  GLUCOSE 101* 104*  BUN 17 19  CREATININE 0.92 1.00   No results for input(s): TROPONINI in the last 72 hours.  Invalid input(s): CK, MB Hepatic Function Panel No results for input(s): PROT,  ALBUMIN, AST, ALT, ALKPHOS, BILITOT, BILIDIR, IBILI in the last 72 hours. Recent Labs    07/08/17 0341  CHOL 155    EKG 07/07/2017: Sinus arrhtymia. Frequent PVC's  Office Echocardiogram 06/29/2017: Left ventricle cavity is normal in size. Mild concentric hypertrophy of the left ventricle. Moderate decrease in LV systolic function with global hypokinesis. No regional wall motion abnormality. Visual EF is 35-40%. Calculated EF 40%. Left atrial cavity is severely dilated at 4.5 cm.  Right atrial cavity is severely dilated. No aortic valve regurgitation noted. Moderate calcification of the aortic valve with severely restricted aortic valve leaflets motion and RCC is fixated. 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 Aortic valve peak pressure gradient of 40 and mean gradient of 25 mmHg, calculated aortic valve area 0.69 cm. Moderate (Grade III) mitral regurgitation. Mild tricuspid regurgitation. Mild pulmonary hypertension. Estimated pulmonary artery systolic pressure 31 mmHg Mild pulmonic regurgitation. 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  Procedures   RIGHT/LEFT HEART CATH AND CORONARY ANGIOGRAPHY  Conclusion   LM: Normal LAD: Severely calcified prox LAD 80% stenosis LCx: Codominant. Severely calcified and tortuous. Prox LCx 70%, mid Lcx 60%, followed by 90% stenosis RCA: Codominant, but relatively small. Severely calcified. Multiple tandem prox-mid 90% stneoses  Dobutamine stress challenge: While AVAi increased to 0.7 cm2, mean PG also increased to 45 mmHg. I suspect this is due to patient's high  cardiac output at baseline. This would still severe low flow low gradient severe aortic stenosis.   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 (Cath note 06/03 above. Also reviewed outpatient echoardiogram. DVI 0.19) Moderate mitral regurgitation Hyponatremia: Dilutional, stable Transaminitis: Likely congestive hepatopathy Hypoalbuminemia: Malnutrition, also likely due to congestion Anemia: Normocytic. Combination of iron deficiency and anemia of chronic disease. Transferrin sat 13%  Plan: Heart team approach. Given LFLG severe AS, moderate MR, and severe multivessel CAD with calcific tortuous vessels, best option would probably be CABG + AVR + MV repair. If deemed to have high surgical risk, would need atherectomy and PCI to at leas LAD, and LCx, followed by TAVR.  Plan on performing TEE on 06/05. Change IV lasix to 20 mg bid.  Keep K at 4, Mg at 2 Appreciate dietitian consult IV iron q72 hr   LOS: 3 days    Jaterrius Ricketson J Cashlyn Huguley 07/10/2017, 8:58 PM  [place], MD [facility] [place] Pager: [phone] Office: [phone] If no answer Cell [phone]

## 2017-07-10 NOTE — CV Procedure (Signed)
Left and right heart cath w/dobutamine challenge, coronary angiography  LM: Normal LAD: Prox LAD calcified 80% stenosis LCx: Tortuous, severely calcified, prox LCx 80%, mid LCx 80-90% stenoses RCA: Dominant, but relatively small vessel with prox & mid 80-% stenoses  Mean PG at baseline: 22 mmHg Mean PG with dobutamine 10 mcg/kg/min: 45 mmHg  Severe multivessel CAD Severe low flow low gradient aortic stenosis  Will discuss with CVTS/TAVR team  Nigel Mormon, MD [facility] [place] Pager: [phone] Office: [phone] If no answer Cell [phone]

## 2017-07-10 NOTE — Consult Note (Addendum)
[facility]                                        Inpatient TAVR Consultation:   Patient ID: [name]; [id number]; 06-Sep-1940   Admit date: 07/07/2017 Date of Consult: 07/10/2017  Primary Care Provider: Vernie Shanks, MD Primary Cardiologist: Dr. Virgina Jock    Patient Profile:   [name] is a 77 y.o. male with a hx of HTN, GERD, multifactorial anemia, and recently diagnosed mod MR, mild TR, LV dysfunction with heart failure, severe low gradient aortic stenosis, hyponatremia, and severe multivessel CAD who is being seen today for the evaluation of AVR + coronary revascularization at the request of Dr. Virgina Jock.  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  He lives in [place] with his wife of 30 years.  He has one son who lives in [place].  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  He bicycles, plays disc golf and goes to the gym at least 3 times a week. He doesn't smoke and is mostly a vegetarian. He has a strong family history of early CAD in his father who had his first MI in his 25s. He regularly sees the dentist and has all his native teeth.  Over the past 2-3 months he has been getting tired more easily, but he chalked this up to old age.  However, about 2 weeks ago he could not bicycle up a hill he had gone up 100s of times before and had to walk. He also developed exertional shortness of breath and fatigue as well as a cough. The cough got worse in a supine position. He also developed LE edema. He couldn't do household chores like taking out the garbage without tiring out.  At that point he knew something was wrong and called his primary medical doctor. His PCP sent him to Dr. Virgina Jock for cardiology consultation.  He says he has known he has had a murmur for the past 20 years, but was remotely seen by cardiologist and told his heart was in  excellent condition. He more recently saw Dr. Earnie Larsson who started him on PO lasix and ordered an echocardiogram. This was completed on 06/29/2017 and showed moderate LV dysfunction with EF 35 to 40%, mild concentric hypertrophy, severe biatrial enlargement, moderate calcification of the aortic valve with severely restricted aortic valve leaflets motion and RCC is fixated, moderate to severe aortic valve stenosis; peak pressure gradient 40 and mean gradient 25 mmHg, AVA  0.69 cm, moderate (Grade III) mitral regurgitation, mild tricuspid regurgitation, mild pulmonary hypertension: PA pressure 31 mmHg, mild pulmonic regurgitation, IVC dilated with poor inspiration collapse consistent with elevated right atrial pressure.  He was seen back in the office for follow up on 07/07/17 and his symptoms were worsening, so he was admitted for IV diuresis and further work up. He was placed on IV Lasix 20mg  BID and diuresed well; 4L, weight 175--> 173 lbs with huge symptomatic relief. Labs revealed iron deficiency anemia with a component of anemia of chronic disease, hyponatremia which was felt to be dilutional as well as hypoalbunemia and mild transaminitis felt to be secondary to congestion. He underwent L/RHC w/ dobutamine challenge today (formal report not available) which showed severe multivessel CAD: pLAD calcified 80% stenosis, LCx tortuous,  severely calcified, prox LCx 80%, mid LCx 80-90% stenoses, RCA dominant, but relatively small vessel with prox & mid 80-% stenoses, and severe low flow low gradient aortic stenosis (Mean PG at baseline: 22 mmHg, Mean PG with dobutamine 10 mcg/kg/min: 45 mmHg). The multidisciplinary valve team was consulted to weigh in on best option for AVR + coronary vascularization.   He is currently feeling much better. He still has residual LE edema. He admits to occasional dizziness but no syncope. He has had no chest pain. He is so thankful that we know what is wrong with him and that we  have the potential to fix him and make him feel better. He gets tearful when talking about his recent decline.     Past Medical History:  Diagnosis Date  . Gout   . Hepatitis 1964   hepatitis A   . Hypertension     Past Surgical History:  Procedure Laterality Date  . EYE SURGERY Bilateral    lens replacements for cataracts  . HERNIA REPAIR    . INGUINAL HERNIA REPAIR Right 05/06/2015   Procedure: LAPAROSCOPIC RIGHT INGUINAL HERNIA WITH MESH;  Surgeon: Coralie Keens, MD;  Location: [facility];  Service: General;  Laterality: Right;  . INSERTION OF MESH Right 05/06/2015   Procedure: INSERTION OF MESH;  Surgeon: Coralie Keens, MD;  Location: [facility];  Service: General;  Laterality: Right;     Inpatient Medications: Scheduled Meds: . feeding supplement (PRO-STAT SUGAR FREE 64)  30 mL Oral TID BM  . furosemide  20 mg Intravenous Q8H  . heparin  5,000 Units Subcutaneous Q8H  . pantoprazole  20 mg Oral Q1200  . potassium chloride  20 mEq Oral BID  . sodium chloride flush  3 mL Intravenous Q12H  . tamsulosin  0.4 mg Oral QPC supper   Continuous Infusions: . sodium chloride    . DOBUTamine Stopped (07/10/17 1011)  . ferumoxytol Stopped (07/09/17 1240)   PRN Meds: sodium chloride, acetaminophen, promethazine **AND** dextromethorphan, docusate sodium, ondansetron (ZOFRAN) IV, sodium chloride flush  Allergies:   No Known Allergies  Social History:   Social History   Socioeconomic History  . Marital status: Married    Spouse name: Not on file  . Number of children: Not on file  . Years of education: Not on file  . Highest education level: Not on file  Occupational History  . Not on file  Social Needs  . Financial resource strain: Not on file  . Food insecurity:    Worry: Not on file    Inability: Not on file  . Transportation needs:    Medical: Not on file    Non-medical: Not on file  Tobacco Use  . Smoking status: Former Research scientist (life sciences)  . Smokeless tobacco: Never Used    Substance and Sexual Activity  . Alcohol use: Yes  . Drug use: No  . Sexual activity: Not on file  Lifestyle  . Physical activity:    Days per week: Not on file    Minutes per session: Not on file  . Stress: Not on file  Relationships  . Social connections:    Talks on phone: Not on file    Gets together: Not on file    Attends religious service: Not on file    Active member of club or organization: Not on file    Attends meetings of clubs or organizations: Not on file    Relationship status: Not on file  . Intimate partner violence:  Fear of current or ex partner: Not on file    Emotionally abused: Not on file    Physically abused: Not on file    Forced sexual activity: Not on file  Other Topics Concern  . Not on file  Social History Narrative  . Not on file    Family History:   The patient's family history includes Heart attack in his father; Stroke in his mother.  ROS:  Please see the history of present illness.  ROS  All other ROS reviewed and negative.     Physical Exam/Data:   Vitals:   07/10/17 1020 07/10/17 1025 07/10/17 1030 07/10/17 1035  BP: 133/83     Pulse: 65 77 74 76  Resp: 14 16 14 15   Temp:      TempSrc:      SpO2: 96% 96% 98% 97%  Weight:      Height:        Intake/Output Summary (Last 24 hours) at 07/10/2017 1128 Last data filed at 07/10/2017 0410 Gross per 24 hour  Intake 622 ml  Output 2675 ml  Net -2053 ml   Filed Weights   07/08/17 0305 07/09/17 0543 07/10/17 0343  Weight: 176 lb 5.9 oz (80 kg) 175 lb 11.3 oz (79.7 kg) 173 lb 1 oz (78.5 kg)   Body mass index is 23.47 kg/m.  General:  Thin, white male in no acute distress HEENT: normal Lymph: no adenopathy Neck: no JVD Endocrine:  No thryomegaly Vascular: No carotid bruits; FA pulses 2+ bilaterally without bruits  Cardiac:  normal S1, S2; RRR; 3/6 SEM @ RUSB, 3/6 holosytolic murmur at apex Lungs: rales at bases  Abd: soft, nontender, no hepatomegaly  Ext: 2 + bilateral LE  edema Musculoskeletal:  No deformities, BUE and BLE strength normal and equal Skin: warm and dry  Neuro:  CNs 2-12 intact, no focal abnormalities noted Psych:  Normal affect   EKG:  The EKG was personally reviewed and demonstrates: sinus with PVCs Telemetry:  Telemetry was personally reviewed and demonstrates:  Sinus with PVCs  Relevant CV Studies: 2D ECHO 06/29/2017: Left ventricle cavity is normal in size. Mild concentric hypertrophy of the left ventricle. Moderate decrease in LV systolic function with global hypokinesis. No regional wall motion abnormality. Visual EF is 35-40%. Calculated EF 40%. Left atrial cavity is severely dilated at 4.5 cm.  Right atrial cavity is severely dilated. No aortic valve regurgitation noted. Moderate calcification of the aortic valve with severely restricted aortic valve leaflets motion and RCC is fixated. 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 Aortic valve peak pressure gradient of 40 and mean gradient of 25 mmHg, calculated aortic valve area 0.69 cm. Moderate (Grade III) mitral regurgitation. Mild tricuspid regurgitation. Mild pulmonary hypertension. Estimated pulmonary artery systolic pressure 31 mmHg Mild pulmonic regurgitation. 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    [facility] 07/10/17 Left and right heart cath w/dobutamine challenge, coronary angiography  LM: Normal LAD: Prox LAD calcified 80% stenosis LCx: Tortuous, severely calcified, prox LCx 80%, mid LCx 80-90% stenoses RCA: Dominant, but relatively small vessel with prox & mid 80-% stenoses  Mean PG at baseline: 22 mmHg Mean PG with dobutamine 10 mcg/kg/min: 45 mmHg  Severe multivessel CAD Severe low flow low gradient aortic stenosis  Will discuss with CVTS/TAVR team   Laboratory Data:  Chemistry Recent Labs  Lab 07/08/17 0341 07/09/17 0232 07/10/17 0229  NA 127* 127* 128*  K 4.2 4.0 4.8  CL 93* 92*  91*  CO2 26  27 30   GLUCOSE 99 101* 104*  BUN 14 17 19   CREATININE 0.95 0.92 1.00  CALCIUM 8.4* 8.5* 9.0  GFRNONAA >60 >60 >60  GFRAA >60 >60 >60  ANIONGAP 8 8 7     Recent Labs  Lab 07/07/17 1447  PROT 5.7*  ALBUMIN 2.7*  AST 83*  ALT 93*  ALKPHOS 92  BILITOT 0.8   Hematology Recent Labs  Lab 07/07/17 1447 07/09/17 1130  WBC 6.1 5.3  RBC 3.45* 3.61*  HGB 10.4* 11.1*  HCT 31.7* 33.2*  MCV 91.9 92.0  MCH 30.1 30.7  MCHC 32.8 33.4  RDW 13.5 13.3  PLT 387 371   Cardiac EnzymesNo results for input(s): TROPONINI in the last 168 hours. No results for input(s): TROPIPOC in the last 168 hours.  BNPNo results for input(s): BNP, PROBNP in the last 168 hours.  DDimer No results for input(s): DDIMER in the last 168 hours.  Radiology/Studies:  Dg Chest 2 View  Result Date: 07/08/2017 CLINICAL DATA:  Shortness of breath for 1-2 weeks. EXAM: CHEST - 2 VIEW COMPARISON:  None. FINDINGS: There is extensive pulmonary edema and trace bilateral pleural effusions. Heart size is upper normal. Extensive atherosclerotic vascular disease is present. No pneumothorax. No acute bony abnormality. IMPRESSION: Congestive heart failure. Electronically Signed   By: Inge Rise M.D.   On: 07/08/2017 09:14   TEE: Under moderate sedation, TEE was performed without complications: LV: EF 69-67% RV: Normal LA: Moderately dilated. Left atrial appendage: Normal without thrombus. Reduced Doppler velocity. Inter atrial septum is intact without defect based on 2D and Doppler evaluation. Marland Kitchen RA: Normal  MV: Moderate secondary MR. Systolic blunting in all pulmonary veins TV: Mild to moderate TR. Peak gradient 37 mmHg AV: Severely calcified. Peak vel 3.65 m/sec AVA 0.6 cm2 PV: Mild PI  Thoracic and ascending aorta: Moderate atheromatous plaque.   Conscious sedation protocol was followed, I personally administered conscious sedation and monitored the patient. Patient received 1 milligrams of Versed and 25 mcg  fentanyl. Patient tolerated the procedure well and there was no complication from conscious sedation. Time administered was 30 min and procedure ended at 11:15 PM.  Nigel Mormon, MD [facility] [place] Pager: [phone] Office: [phone] If no answer Cell [phone]      STS Risk Calculator: Procedure: AVR + CAB CALCULATE   Risk of Mortality:  2.014%   Renal Failure:  1.571%   Permanent Stroke:  2.535%   Prolonged Ventilation:  7.656%   DSW Infection:  0.156%   Reoperation:  5.483%   Morbidity or Mortality:  14.859%   Short Length of Stay:  27.684%   Long Length of Stay:  6.323%     Assessment and Plan:   [name] is a 77 y.o. male with symptoms of severe, stage D2 aortic stenosis with NYHA Class IV symptoms. I have personally reviewed the patient's recent echocardiogram which is notable for reduced LV systolic function (EF 15-40%) and severe low gradient, low flow aortic stenosis with peak gradient of 40 mmHg and mean transvalvular gradient of 25 mmHg. The patient's dimensionless index is not reported and calculated aortic valve area is 0.69 cm. Cath today showed severe multivessel CAD requiring revascularization.   I have reviewed the natural history of aortic stenosis with the patient. We have discussed the limitations of medical therapy and the poor prognosis associated with symptomatic aortic stenosis. We have reviewed potential treatment options, including palliative medical therapy, conventional surgical aortic valve replacement, and  transcatheter aortic valve replacement. We discussed treatment options in the context of this patient's specific comorbid medical conditions.   The patient's predicted risk of mortality with conventional aortic valve replacement is 2.014% primarily based on age, severe LV dysfunction, mod MR, acute CHF and anemia. Other significant comorbid conditions include hyponatremia, hypoalbunemia.  The patient would be a  reasonable TAVR + PCI (arthrectomy) candidate given significant LV function and symptomatic CHF. However, he is a relatively young, active man with mod MR and severe multivessel CAD requiring revascularization and may be a better open surgical AVR + CABG +/- mitral valve repair candidate. His STS score is ~2%. His history and echo/cath will be reviewed by the multidisciplinary valve team. Dr. Cyndia Bent to follow.    Signed, Angelena Form, PA-C  07/10/2017 11:28 AM    Chart reviewed, patient examined, agree with above. He is a very active 77 year old who had been exercising daily until the past couple weeks. He has a 2 month history of decreased stamina and exertional shortness of breath that progressed over the past two weeks. He was trying to ride his bicycle up hills that he never had a problem with but became exhausted and short of breath and had to walk. He also developed some lower extremity edema. He had no prior cardiac history and presented to his PCP who referred him promptly for cariology evaluation. His echo showed a trileaflet aortic valve that was calcifed with reduced mobility and a mean gradient of 25 mm Hg, reduced EF of 35-40% and moderate MR. Cath shows severe 3 vessel CAD. His mean gradient across the aortic valve increased to 45 mm Hg on 10 mcg of dobutamine consistent with low gradient, low EF severe AS. TEE today confirms a severely calcified AV with reduced mobility with a peak velocity of 3.65 m/sec and an AVA of 0.6 cm2. There is moderate secondary MR. LVEF is 30-35%. I think the best treatment for him is CABG/AVR/MV repair with clipping of the LAA. His coronary disease is best treated with CABG, not PCI. I discussed the operative procedure with the patient and his wife including alternatives, benefits and risks; including but not limited to bleeding, blood transfusion, infection, stroke, myocardial infarction, graft failure, heart block requiring a permanent pacemaker, organ  dysfunction, and death.  [name] understands and agrees to proceed.  We will schedule surgery for Monday 07/24/2017. He is stable and CHF is resolved. He can go home and we will schedule surgery and call him.   I spent 60 minutes performing this consultation and > 50% of this time was spent face to face counseling and coordinating the care of this patient's severe multi-vessel coronary artery disease, severe aortic stenosis, and moderate mitral regurgitation.

## 2017-07-11 ENCOUNTER — Inpatient Hospital Stay (HOSPITAL_COMMUNITY): Payer: Medicare Other

## 2017-07-11 ENCOUNTER — Ambulatory Visit (HOSPITAL_COMMUNITY): Admit: 2017-07-11 | Payer: Medicare Other | Admitting: Cardiology

## 2017-07-11 ENCOUNTER — Encounter (HOSPITAL_COMMUNITY)
Admission: AD | Disposition: A | Discharge status: Home / Self Care | Payer: Self-pay | Source: Ambulatory Visit | Attending: Cardiology

## 2017-07-11 ENCOUNTER — Encounter (HOSPITAL_COMMUNITY): Payer: Self-pay | Admitting: Cardiology

## 2017-07-11 DIAGNOSIS — Z01810 Encounter for preprocedural cardiovascular examination: Secondary | ICD-10-CM

## 2017-07-11 HISTORY — PX: TEE WITHOUT CARDIOVERSION: SHX5443

## 2017-07-11 LAB — PULMONARY FUNCTION TEST
DL/VA % pred: 80 %
DL/VA: 3.76 ml/min/mmHg/L
DLCO cor % pred: 49 %
DLCO cor: 17.18 ml/min/mmHg
DLCO unc % pred: 43 %
DLCO unc: 15.2 ml/min/mmHg
FEF 25-75 Post: 1.39 L/sec
FEF 25-75 Pre: 2.59 L/sec
FEF2575-%Change-Post: -46 %
FEF2575-%Pred-Post: 58 %
FEF2575-%Pred-Pre: 109 %
FEV1-%Change-Post: -6 %
FEV1-%Pred-Post: 65 %
FEV1-%Pred-Pre: 70 %
FEV1-Post: 2.17 L
FEV1-Pre: 2.32 L
FEV1FVC-%Change-Post: -4 %
FEV1FVC-%Pred-Pre: 114 %
FEV6-%Change-Post: -2 %
FEV6-%Pred-Post: 63 %
FEV6-%Pred-Pre: 65 %
FEV6-Post: 2.73 L
FEV6-Pre: 2.79 L
FEV6FVC-%Change-Post: 0 %
FEV6FVC-%Pred-Post: 105 %
FEV6FVC-%Pred-Pre: 105 %
FVC-%Change-Post: -2 %
FVC-%Pred-Post: 60 %
FVC-%Pred-Pre: 61 %
FVC-Post: 2.74 L
FVC-Pre: 2.81 L
Post FEV1/FVC ratio: 79 %
Post FEV6/FVC ratio: 100 %
Pre FEV1/FVC ratio: 82 %
Pre FEV6/FVC Ratio: 100 %
RV % pred: 55 %
RV: 1.49 L
TLC % pred: 57 %
TLC: 4.32 L

## 2017-07-11 LAB — BASIC METABOLIC PANEL
Anion gap: 9 (ref 5–15)
BUN: 21 mg/dL — ABNORMAL HIGH (ref 6–20)
CO2: 28 mmol/L (ref 22–32)
Calcium: 9 mg/dL (ref 8.9–10.3)
Chloride: 93 mmol/L — ABNORMAL LOW (ref 101–111)
Creatinine, Ser: 1 mg/dL (ref 0.61–1.24)
GFR calc Af Amer: >60 mL/min (ref >60)
GFR calc non Af Amer: >60 mL/min (ref >60)
Glucose, Bld: 93 mg/dL (ref 65–99)
Potassium: 4.3 mmol/L (ref 3.5–5.1)
Sodium: 130 mmol/L — ABNORMAL LOW (ref 135–145)

## 2017-07-11 SURGERY — ECHOCARDIOGRAM, TRANSESOPHAGEAL
Anesthesia: Moderate Sedation

## 2017-07-11 MED ORDER — ALBUTEROL SULFATE (2.5 MG/3ML) 0.083% IN NEBU
2.5000 mg | INHALATION_SOLUTION | Freq: Once | RESPIRATORY_TRACT | Status: AC
  Administered 2017-07-11: 2.5 mg via RESPIRATORY_TRACT

## 2017-07-11 MED ORDER — SODIUM CHLORIDE 0.9 % IV SOLN
INTRAVENOUS | Status: AC | PRN
  Administered 2017-07-11: 500 mL via INTRAMUSCULAR

## 2017-07-11 MED ORDER — FENTANYL CITRATE (PF) 100 MCG/2ML IJ SOLN
INTRAMUSCULAR | Status: DC | PRN
  Administered 2017-07-11: 25 ug via INTRAVENOUS

## 2017-07-11 MED ORDER — FENTANYL CITRATE (PF) 100 MCG/2ML IJ SOLN
INTRAMUSCULAR | Status: AC
  Filled 2017-07-11: qty 2

## 2017-07-11 MED ORDER — BUTAMBEN-TETRACAINE-BENZOCAINE 2-2-14 % EX AERO
INHALATION_SPRAY | CUTANEOUS | Status: DC | PRN
  Administered 2017-07-11: 2 via TOPICAL

## 2017-07-11 MED ORDER — MIDAZOLAM HCL 5 MG/ML IJ SOLN
INTRAMUSCULAR | Status: DC | PRN
  Administered 2017-07-11 (×2): 1 mg via INTRAVENOUS

## 2017-07-11 MED ORDER — MIDAZOLAM HCL 5 MG/ML IJ SOLN
INTRAMUSCULAR | Status: AC
  Filled 2017-07-11: qty 2

## 2017-07-11 NOTE — Progress Notes (Signed)
Pre-CABG testing has been completed. 1-39% ICA stenosis bilaterally  ABI's Right 1.19 Left 1.18  07/11/17 9:44 AM Carlos Levering RVT

## 2017-07-11 NOTE — Progress Notes (Signed)
Subjective:  Feels better Breathing improved  Objective:  Vital Signs in the last 24 hours: Temp:  [97.5 F (36.4 C)-98.7 F (37.1 C)] 97.8 F (36.6 C) (06/04 0951) Pulse Rate:  [0-85] 73 (06/04 0951) Resp:  [0-64] 22 (06/04 0951) BP: (104-133)/(67-83) 124/70 (06/04 0951) SpO2:  [0 %-100 %] 96 % (06/04 0951) Weight:  [76.2 kg (168 lb)] 76.2 kg (168 lb) (06/04 0951)   Occasional PVC's  Intake/Output from previous day: 06/03 0701 - 06/04 0700 In: 826 [P.O.:720; I.V.:106] Out: 2000 [Urine:2000] Intake/Output from this shift: No intake/output data recorded.  Physical Exam: Nursing note and vitals reviewed. Constitutional: He is oriented to person, place, and time. He appears well-developed and well-nourished.  Neck: Normal range of motion. Neck supple. Minimal JVD present.  Cardiovascular: Normal rate.  Murmur (III/VI crescendo murmur RUSB, III/VI holosystolic murmur apex) heard. Pulses:      Dorsalis pedis pulses are 2+ on the right side, and 2+ on the left side.       Posterior tibial pulses are 2+ on the right side, and 2+ on the left side.  Carotid bruit, possible conducted aortic murmur  Respiratory: Effort normal. He has rales.  GI: Soft. Bowel sounds are normal. He exhibits distension. There is no tenderness.  Musculoskeletal: He exhibits edema (1+ b/l).  Lymphadenopathy:    He has no cervical adenopathy.  Neurological: He is alert and oriented to person, place, and time. No cranial nerve deficit.  Skin: Skin is warm and dry.  Psychiatric: He has a normal mood and affect.    Lab Results: Recent Labs    07/09/17 1130  WBC 5.3  HGB 11.1*  PLT 371   Recent Labs    07/10/17 0229 07/11/17 0222  NA 128* 130*  K 4.8 4.3  CL 91* 93*  CO2 30 28  GLUCOSE 104* 93  BUN 19 21*  CREATININE 1.00 1.00   No results for input(s): TROPONINI in the last 72 hours.  Invalid input(s): CK, MB Hepatic Function Panel No results for input(s): PROT, ALBUMIN, AST, ALT,  ALKPHOS, BILITOT, BILIDIR, IBILI in the last 72 hours. No results for input(s): CHOL in the last 72 hours.  EKG 07/07/2017: Sinus arrhtymia. Frequent PVC's  Office Echocardiogram 06/29/2017: Left ventricle cavity is normal in size. Mild concentric hypertrophy of the left ventricle. Moderate decrease in LV systolic function with global hypokinesis. No regional wall motion abnormality. Visual EF is 35-40%. Calculated EF 40%. Left atrial cavity is severely dilated at 4.5 cm.  Right atrial cavity is severely dilated. No aortic valve regurgitation noted. Moderate calcification of the aortic valve with severely restricted aortic valve leaflets motion and RCC is fixated. 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 Aortic valve peak pressure gradient of 40 and mean gradient of 25 mmHg, calculated aortic valve area 0.69 cm. Moderate (Grade III) mitral regurgitation. Mild tricuspid regurgitation. Mild pulmonary hypertension. Estimated pulmonary artery systolic pressure 31 mmHg Mild pulmonic regurgitation. 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  Procedures   RIGHT/LEFT HEART CATH AND CORONARY ANGIOGRAPHY  Conclusion   LM: Normal LAD: Severely calcified prox LAD 80% stenosis LCx: Codominant. Severely calcified and tortuous. Prox LCx 70%, mid Lcx 60%, followed by 90% stenosis RCA: Codominant, but relatively small. Severely calcified. Multiple tandem prox-mid 90% stneoses  Dobutamine stress challenge: While AVAi increased to 0.7 cm2, mean PG also increased to 45 mmHg. I suspect this is due to patient's high cardiac output at baseline. This would still  severe low flow low gradient severe aortic stenosis.   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 (Cath note 06/03 above. Also reviewed outpatient echoardiogram. DVI 0.19) Moderate mitral regurgitation Hyponatremia: Dilutional, stable Transaminitis: Likely congestive hepatopathy Hypoalbuminemia: Malnutrition, also likely due to congestion Anemia: Normocytic. Combination of iron deficiency and anemia of chronic disease. Transferrin sat 13%  Plan: Plan for CABG + AVR +/- mitral valve repair later this week TEE today to evaluate mitral regurgitation better Continue IV lasix to 20 mg bid.  Keep K at 4, Mg at 2 IV iron q72 hr   LOS: 4 days    Felice Deem J Sharyl Panchal 07/11/2017, 10:13 AM  Nigel Mormon, MD [facility] [place] Pager: [phone] Office: [phone] If no answer Cell [phone]

## 2017-07-11 NOTE — CV Procedure (Signed)
TEE: Under moderate sedation, TEE was performed without complications: LV: EF 67-89% RV: Normal LA: Moderately dilated. Left atrial appendage: Normal without thrombus. Reduced Doppler velocity. Inter atrial septum is intact without defect based on 2D and Doppler evaluation. Marland Kitchen RA: Normal  MV: Moderate secondary MR. Systolic blunting in all pulmonary veins TV: Mild to moderate TR. Peak gradient 37 mmHg AV: Severely calcified. Peak vel 3.65 m/sec AVA 0.6 cm2 PV: Mild PI  Thoracic and ascending aorta: Moderate atheromatous plaque.   Conscious sedation protocol was followed, I personally administered conscious sedation and monitored the patient. Patient received 1 milligrams of Versed and 25 mcg fentanyl. Patient tolerated the procedure well and there was no complication from conscious sedation. Time administered was 30 min and procedure ended at 11:15 PM.  Nigel Mormon, MD [facility]. [place] Pager: [phone] Office: [phone] If no answer Cell [phone]

## 2017-07-11 NOTE — Progress Notes (Signed)
  Echocardiogram Echocardiogram Transesophageal has been performed.  [name] 07/11/2017, 11:30 AM

## 2017-07-11 NOTE — Progress Notes (Addendum)
CARDIAC REHAB PHASE I   PRE:  Rate/Rhythm: 2 SR with PVCs    BP: sitting 107/70    SaO2: 100 RA  MODE:  Ambulation: 640 ft   POST:  Rate/Rhythm: 96 SR with PACs    BP: sitting 121/70     SaO2: 100 RA  Pt steady, walked without problems. He did feel somewhat SOB toward end. x1 rest. Discussed sternal precautions, IS (1250 mL), mobility post op and d/c planning. Pt just moved in to retirement community where he and his wife have assistance (his wife falls often). Gave materials to read and watch. Can walk independently. Pt sts he feels too good to not walk. [place], ACSM 07/11/2017 2:03 PM

## 2017-07-11 NOTE — H&P (View-Only) (Signed)
Subjective:  Feels better Breathing improved  Objective:  Vital Signs in the last 24 hours: Temp:  [97.5 F (36.4 C)-98.7 F (37.1 C)] 97.8 F (36.6 C) (06/04 0951) Pulse Rate:  [0-85] 73 (06/04 0951) Resp:  [0-64] 22 (06/04 0951) BP: (104-133)/(67-83) 124/70 (06/04 0951) SpO2:  [0 %-100 %] 96 % (06/04 0951) Weight:  [76.2 kg (168 lb)] 76.2 kg (168 lb) (06/04 0951)   Occasional PVC's  Intake/Output from previous day: 06/03 0701 - 06/04 0700 In: 826 [P.O.:720; I.V.:106] Out: 2000 [Urine:2000] Intake/Output from this shift: No intake/output data recorded.  Physical Exam: Nursing note and vitals reviewed. Constitutional: He is oriented to person, place, and time. He appears well-developed and well-nourished.  Neck: Normal range of motion. Neck supple. Minimal JVD present.  Cardiovascular: Normal rate.  Murmur (III/VI crescendo murmur RUSB, III/VI holosystolic murmur apex) heard. Pulses:      Dorsalis pedis pulses are 2+ on the right side, and 2+ on the left side.       Posterior tibial pulses are 2+ on the right side, and 2+ on the left side.  Carotid bruit, possible conducted aortic murmur  Respiratory: Effort normal. He has rales.  GI: Soft. Bowel sounds are normal. He exhibits distension. There is no tenderness.  Musculoskeletal: He exhibits edema (1+ b/l).  Lymphadenopathy:    He has no cervical adenopathy.  Neurological: He is alert and oriented to person, place, and time. No cranial nerve deficit.  Skin: Skin is warm and dry.  Psychiatric: He has a normal mood and affect.    Lab Results: Recent Labs    07/09/17 1130  WBC 5.3  HGB 11.1*  PLT 371   Recent Labs    07/10/17 0229 07/11/17 0222  NA 128* 130*  K 4.8 4.3  CL 91* 93*  CO2 30 28  GLUCOSE 104* 93  BUN 19 21*  CREATININE 1.00 1.00   No results for input(s): TROPONINI in the last 72 hours.  Invalid input(s): CK, MB Hepatic Function Panel No results for input(s): PROT, ALBUMIN, AST, ALT,  ALKPHOS, BILITOT, BILIDIR, IBILI in the last 72 hours. No results for input(s): CHOL in the last 72 hours.  EKG 07/07/2017: Sinus arrhtymia. Frequent PVC's  Office Echocardiogram 06/29/2017: Left ventricle cavity is normal in size. Mild concentric hypertrophy of the left ventricle. Moderate decrease in LV systolic function with global hypokinesis. No regional wall motion abnormality. Visual EF is 35-40%. Calculated EF 40%. Left atrial cavity is severely dilated at 4.5 cm.  Right atrial cavity is severely dilated. No aortic valve regurgitation noted. Moderate calcification of the aortic valve with severely restricted aortic valve leaflets motion and RCC is fixated. 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 Aortic valve peak pressure gradient of 40 and mean gradient of 25 mmHg, calculated aortic valve area 0.69 cm. Moderate (Grade III) mitral regurgitation. Mild tricuspid regurgitation. Mild pulmonary hypertension. Estimated pulmonary artery systolic pressure 31 mmHg Mild pulmonic regurgitation. 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  Procedures   RIGHT/LEFT HEART CATH AND CORONARY ANGIOGRAPHY  Conclusion   LM: Normal LAD: Severely calcified prox LAD 80% stenosis LCx: Codominant. Severely calcified and tortuous. Prox LCx 70%, mid Lcx 60%, followed by 90% stenosis RCA: Codominant, but relatively small. Severely calcified. Multiple tandem prox-mid 90% stneoses  Dobutamine stress challenge: While AVAi increased to 0.7 cm2, mean PG also increased to 45 mmHg. I suspect this is due to patient's high cardiac output at baseline. This would still  severe low flow low gradient severe aortic stenosis.   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 (Cath note 06/03 above. Also reviewed outpatient echoardiogram. DVI 0.19) Moderate mitral regurgitation Hyponatremia: Dilutional, stable Transaminitis: Likely congestive hepatopathy Hypoalbuminemia: Malnutrition, also likely due to congestion Anemia: Normocytic. Combination of iron deficiency and anemia of chronic disease. Transferrin sat 13%  Plan: Plan for CABG + AVR +/- mitral valve repair later this week TEE today to evaluate mitral regurgitation better Continue IV lasix to 20 mg bid.  Keep K at 4, Mg at 2 IV iron q72 hr   LOS: 4 days    [name] 07/11/2017, 10:13 AM  Jesse Mormon, MD [facility] [place] Pager: [phone] Office: [phone] If no answer Cell [phone]

## 2017-07-11 NOTE — Interval H&P Note (Signed)
History and Physical Interval Note:  07/11/2017 10:51 AM  [name]  has presented today for surgery, with the diagnosis of AORTIC STENOSIS, HEART FAILURE  The various methods of treatment have been discussed with the patient and family. After consideration of risks, benefits and other options for treatment, the patient has consented to  Procedure(s): TRANSESOPHAGEAL ECHOCARDIOGRAM (TEE) (N/A) as a surgical intervention .  The patient's history has been reviewed, patient examined, no change in status, stable for surgery.  I have reviewed the patient's chart and labs.  Questions were answered to the patient's satisfaction.     [place]

## 2017-07-12 ENCOUNTER — Encounter (HOSPITAL_COMMUNITY): Payer: Self-pay | Admitting: Cardiology

## 2017-07-12 ENCOUNTER — Other Ambulatory Visit: Payer: Self-pay | Admitting: *Deleted

## 2017-07-12 DIAGNOSIS — I2510 Atherosclerotic heart disease of native coronary artery without angina pectoris: Secondary | ICD-10-CM

## 2017-07-12 DIAGNOSIS — I350 Nonrheumatic aortic (valve) stenosis: Secondary | ICD-10-CM

## 2017-07-12 DIAGNOSIS — I340 Nonrheumatic mitral (valve) insufficiency: Secondary | ICD-10-CM

## 2017-07-12 LAB — BASIC METABOLIC PANEL
Anion gap: 6 (ref 5–15)
BUN: 21 mg/dL — ABNORMAL HIGH (ref 6–20)
CO2: 30 mmol/L (ref 22–32)
Calcium: 9 mg/dL (ref 8.9–10.3)
Chloride: 96 mmol/L — ABNORMAL LOW (ref 101–111)
Creatinine, Ser: 1.01 mg/dL (ref 0.61–1.24)
GFR calc Af Amer: >60 mL/min (ref >60)
GFR calc non Af Amer: >60 mL/min (ref >60)
Glucose, Bld: 100 mg/dL — ABNORMAL HIGH (ref 65–99)
Potassium: 5 mmol/L (ref 3.5–5.1)
Sodium: 132 mmol/L — ABNORMAL LOW (ref 135–145)

## 2017-07-12 MED ORDER — DOCUSATE SODIUM 100 MG PO CAPS: 100.0000 mg | ORAL_CAPSULE | Freq: Two times a day (BID) | ORAL | 3 refills | Status: DC | PRN

## 2017-07-12 MED ORDER — TORSEMIDE 20 MG PO TABS: 20.0000 mg | ORAL_TABLET | Freq: Two times a day (BID) | ORAL | 3 refills | Status: DC

## 2017-07-12 MED ORDER — FUROSEMIDE 10 MG/ML IJ SOLN
40.0000 mg | Freq: Once | INTRAMUSCULAR | Status: AC
  Administered 2017-07-12: 40 mg via INTRAVENOUS
  Filled 2017-07-12: qty 4

## 2017-07-12 MED FILL — Heparin Sod (Porcine)-NaCl IV Soln 1000 Unit/500ML-0.9%: INTRAVENOUS | Qty: 1000 | Status: AC

## 2017-07-12 NOTE — Discharge Summary (Signed)
Physician Discharge Summary  Patient ID: [name] [name] MRN: [id number] DOB/AGE: 12-Apr-1940 77 y.o.  Admit date: 07/07/2017 Discharge date: 07/12/2017  Admission Diagnoses: Shortness of breath Aortic stenosis HFrEF  Discharge Diagnoses:  Active Problems:   GERD (gastroesophageal reflux disease)   HFrEF (heart failure with reduced ejection fraction) (HCC)   Aortic stenosis with trileaflet valve   Mitral regurgitation   Acute on chronic systolic heart failure [facility])   Discharged Condition: stable  Hospital Course:   Patient was admitted to the hospital due to severe shortness of breath and bilateral leg edema.  Workup with left and right heart catheterization with dobutamine challenge, as well as transesophageal echocardiogram showed severe low flow gradient aortic stenosis, moderate mitral regurgitation, and cardio myopathy- like the combination of valvular as well as ischemic- EF 30-35%.  Patient was also found to have anemia, likely combination iron deficiency and chronic disease, dilutional hyponatremia, malnourishment with hypoalbuminemia, mildly elevated liver enzymes due to congestive hepatopathy.  Patient was treated with IV diuresis and had 8 L diuresis to the hospital stay.  He was also given IV iron given his transferrin saturation 13%. Hyponatremia improved with diuresis. Multidiscdisciplinary discussion with heart team approach was undertaken decide further management.  Dr. Cyndia Bent saw the patient and recommended coronary artery this Surgery, aortic valve replacement, mitral valve repair, as well as left atrial appendage clipping.  Patient was discharged home on p.o. torsemide 20 mg twice daily.  I will see him for transition of care follow-up on Monday 07/17/2017.  Surgery is scheduled for next week.  Consults: CVTS  Significant Diagnostic Studies:  Results for [name] [name] (MRN [id number]) as of 07/12/2017 09:46  Ref. Range 07/07/2017 14:47 07/09/2017 11:30  WBC Latest Ref  Range: 4.0 - 10.5 K/uL 6.1 5.3  RBC Latest Ref Range: 4.22 - 5.81 MIL/uL 3.45 (L) 3.61 (L)  Hemoglobin Latest Ref Range: 13.0 - 17.0 g/dL 10.4 (L) 11.1 (L)  HCT Latest Ref Range: 39.0 - 52.0 % 31.7 (L) 33.2 (L)  MCV Latest Ref Range: 78.0 - 100.0 fL 91.9 92.0  MCH Latest Ref Range: 26.0 - 34.0 pg 30.1 30.7  MCHC Latest Ref Range: 30.0 - 36.0 g/dL 32.8 33.4  RDW Latest Ref Range: 11.5 - 15.5 % 13.5 13.3  Platelets Latest Ref Range: 150 - 400 K/uL 387 371   Results for [name], [name] (MRN [id number]) as of 07/12/2017 09:46  Ref. Range 07/09/2017 02:32 07/10/2017 02:29 07/11/2017 02:22 07/12/2017 62:95  BASIC METABOLIC PANEL Unknown Rpt (A) Rpt (A) Rpt (A) Rpt (A)  Sodium Latest Ref Range: 135 - 145 mmol/L 127 (L) 128 (L) 130 (L) 132 (L)  Potassium Latest Ref Range: 3.5 - 5.1 mmol/L 4.0 4.8 4.3 5.0  Chloride Latest Ref Range: 101 - 111 mmol/L 92 (L) 91 (L) 93 (L) 96 (L)  CO2 Latest Ref Range: 22 - 32 mmol/L 27 30 28 30   Glucose Latest Ref Range: 65 - 99 mg/dL 101 (H) 104 (H) 93 100 (H)  BUN Latest Ref Range: 6 - 20 mg/dL 17 19 21  (H) 21 (H)  Creatinine Latest Ref Range: 0.61 - 1.24 mg/dL 0.92 1.00 1.00 1.01  Calcium Latest Ref Range: 8.9 - 10.3 mg/dL 8.5 (L) 9.0 9.0 9.0  Anion gap Latest Ref Range: 5 - 15  8 7 9 6   Magnesium Latest Ref Range: 1.7 - 2.4 mg/dL  1.9    GFR, Est Non African American Latest Ref Range: >60 mL/min >60 >60 >60 >60  GFR, Est African American Latest Ref  Range: >60 mL/min >60 >60 >60 >60   Results for [name], [name] (MRN [id number]) as of 07/12/2017 09:46  Ref. Range 07/08/2017 11:25  Iron Latest Ref Range: 45 - 182 ug/dL 38 (L)  UIBC Latest Units: ug/dL 256  TIBC Latest Ref Range: 250 - 450 ug/dL 294  Saturation Ratios Latest Ref Range: 17.9 - 39.5 % 13 (L)  Transferrin Latest Ref Range: 180 - 329 mg/dL 210   Echo TEE 07/11/2017: Study Conclusions  - Left ventricle: Systolic function was moderately to severely   reduced. The estimated ejection fraction was in the range  of 30%   to 35%. Diffuse hypokinesis. - Aortic valve: Severely calcified leaflets. Valve mobility was   severely restricted.   Peak vel 3.6 m/sec, mean PG 26 mmHg. AVA 0.7 cm2 - Aorta: Moderate atheromatous plaque aortic arch. - Left atrium: The atrium was moderately dilated. No evidence of   thrombus in the atrial cavity or appendage. Emptying velocity was   moderately reduced. - Tricuspid valve: There was mild-moderate regurgitation. Peak   RA-RV gradient 37 mmhg  Procedures   RIGHT/LEFT HEART CATH AND CORONARY ANGIOGRAPHY  Conclusion   LM: Normal LAD: Severely calcified prox LAD 80% stenosis LCx: Codominant. Severely calcified and tortuous. Prox LCx 70%, mid Lcx 60%, followed by 90% stenosis RCA: Codominant, but relatively small. Severely calcified. Multiple tandem prox-mid 90% stneoses  Dobutamine stress challenge: While AVAi increased to 0.7 cm2, mean PG also increased to 45 mmHg. I suspect this is due to patient's high cardiac output at baseline. This would still severe low flow low gradient severe aortic stenosis.   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 rate 20, height 6' (1.829 m), weight 75.1 kg (165 lb 9.6 oz), SpO2 96 %. Nursing noteand vitalsreviewed. Constitutional: He isoriented to person, place, and time. He appearswell-developedand well-nourished.  Neck:Normal range of motion.Neck supple.Minimal JVD present.  Cardiovascular:Normal rate. Murmur(III/VI crescendo murmur RUSB, III/VI holosystolic murmur apex)heard. Pulses: Dorsalis pedis pulses are 2+on the right side, and 2+on the left side.  Posterior tibial pulses are 2+on the right side, and 2+on the left side.  Carotid bruit, possible conducted aortic murmur Respiratory:Effort normal. He hasrales.  WE:RXVQ.Bowel  sounds are normal. He exhibitsdistension. There isno tenderness.  Musculoskeletal: He exhibitsedema(1+ b/l).  Lymphadenopathy:  He has no cervical adenopathy.  Neurological: He isalertand oriented to person, place, and time. Nocranial nerve deficit.  Skin: Skin iswarmand dry.  Psychiatric: He has anormal mood and affect.     Disposition: Discharge disposition: 01-Home or Self Care      Discharge Instructions    Avoid straining   Complete by:  As directed    Diet - low sodium heart healthy   Complete by:  As directed    Heart Failure patients record your daily weight using the same scale at the same time of day   Complete by:  As directed    Increase activity slowly   Complete by:  As directed      Allergies as of 07/12/2017   No Known Allergies     Medication List    STOP taking these medications   furosemide 20 MG tablet Commonly known as:  LASIX   ibuprofen 200 MG tablet Commonly known as:  ADVIL,MOTRIN   promethazine-dextromethorphan 6.25-15 MG/5ML syrup Commonly known as:  PROMETHAZINE-DM  TAKE these medications   allopurinol 100 MG tablet Commonly known as:  ZYLOPRIM Take 200 mg by mouth every morning.   docusate sodium 100 MG capsule Commonly known as:  COLACE Take 1 capsule (100 mg total) by mouth 2 (two) times daily as needed for mild constipation.   fexofenadine 180 MG tablet Commonly known as:  ALLEGRA Take 180 mg by mouth daily.   tamsulosin 0.4 MG Caps capsule Commonly known as:  FLOMAX Take 0.4 mg by mouth daily after supper.   torsemide 20 MG tablet Commonly known as:  DEMADEX Take 1 tablet (20 mg total) by mouth 2 (two) times daily.        SignedNigel Mormon 07/12/2017, 9:40 AM  Nigel Mormon, MD [facility] [place] Pager: [phone] Office: [phone] If no answer Cell [phone]

## 2017-07-12 NOTE — Plan of Care (Signed)
Pt to be discharge home after feraheme iv.

## 2017-07-12 NOTE — Care Management Important Message (Signed)
Important Message  Patient Details  Name: [name] MRN: [id number] Date of Birth: 12/24/1940   Medicare Important Message Given:  Yes    [name] 07/12/2017, 1:10 PM

## 2017-07-12 NOTE — Progress Notes (Signed)
Explained and discussed discharge instructions, prescriptions sent to pharmacy electronically. Going home with family to follow up with Dr. Mamie Nick before schedule of CABG with Dr. Cyndia Bent on June 17.

## 2017-07-18 ENCOUNTER — Encounter: Payer: Self-pay | Admitting: Thoracic Surgery (Cardiothoracic Vascular Surgery)

## 2017-07-19 NOTE — Pre-Procedure Instructions (Signed)
[name]  07/19/2017      [id number] - , [place] [address] AT [address] [place] [address] Phone: [phone] Fax: [phone]    Your procedure is scheduled on June 17  Report to [place] at [place].M.  Call this number if you have problems the morning of surgery:  [phone]   Remember:  NOTHING TO EAT OR DRINK AFTER MIDNIGHT    Take these medicines the morning of surgery with A SIP OF WATER  allopurinol (ZYLOPRIM  fexofenadine (ALLEGRA) tamsulosin (FLOMAX)  7 days prior to surgery STOP taking any Aspirin(unless otherwise instructed by your surgeon), Aleve, Naproxen, Ibuprofen, Motrin, Advil, Goody's, BC's, all herbal medications, fish oil, and all vitamins     Do not wear jewelry  Do not wear lotions, powders, or cologne, or deodorant.  Men may shave face and neck.  Do not bring valuables to the hospital.  [facility] is not responsible for any belongings or valuables.  Contacts, dentures or bridgework may not be worn into surgery.  Leave your suitcase in the car.  After surgery it may be brought to your room.  For patients admitted to the hospital, discharge time will be determined by your treatment team.  Patients discharged the day of surgery will not be allowed to drive home.    Special instructions:   [facility]- Preparing For Surgery  Before surgery, you can play an important role. Because skin is not sterile, your skin needs to be as free of germs as possible. You can reduce the number of germs on your skin by washing with CHG (chlorahexidine gluconate) Soap before surgery.  CHG is an antiseptic cleaner which kills germs and bonds with the skin to continue killing germs even after washing.    Oral Hygiene is also important to reduce your risk of infection.  Remember - BRUSH YOUR TEETH THE MORNING OF SURGERY WITH YOUR REGULAR TOOTHPASTE  Please do not use  if you have an allergy to CHG or antibacterial soaps. If your skin becomes reddened/irritated stop using the CHG.  Do not shave (including legs and underarms) for at least 48 hours prior to first CHG shower. It is OK to shave your face.  Please follow these instructions carefully.   1. Shower the NIGHT BEFORE SURGERY and the MORNING OF SURGERY with CHG.   2. If you chose to wash your hair, wash your hair first as usual with your normal shampoo.  3. After you shampoo, rinse your hair and body thoroughly to remove the shampoo.  4. Use CHG as you would any other liquid soap. You can apply CHG directly to the skin and wash gently with a scrungie or a clean washcloth.   5. Apply the CHG Soap to your body ONLY FROM THE NECK DOWN.  Do not use on open wounds or open sores. Avoid contact with your eyes, ears, mouth and genitals (private parts). Wash Face and genitals (private parts)  with your normal soap.  6. Wash thoroughly, paying special attention to the area where your surgery will be performed.  7. Thoroughly rinse your body with warm water from the neck down.  8. DO NOT shower/wash with your normal soap after using and rinsing off the CHG Soap.  9. Pat yourself dry with a CLEAN TOWEL.  10. Wear CLEAN PAJAMAS to bed the night before surgery, wear comfortable clothes the morning of surgery  11. Place  CLEAN SHEETS on your bed the night of your first shower and DO NOT SLEEP WITH PETS.    Day of Surgery:  Do not apply any deodorants/lotions.  Please wear clean clothes to the hospital/surgery center.   Remember to brush your teeth WITH YOUR REGULAR TOOTHPASTE.    Please read over the following fact sheets that you were given.

## 2017-07-20 ENCOUNTER — Ambulatory Visit (HOSPITAL_COMMUNITY)
Admission: RE | Admit: 2017-07-20 | Discharge: 2017-07-20 | Disposition: A | Discharge status: Home / Self Care | Payer: Medicare Other | Source: Ambulatory Visit | Attending: Surgery | Admitting: Surgery

## 2017-07-20 ENCOUNTER — Encounter (HOSPITAL_COMMUNITY)
Admit: 2017-07-20 | Discharge: 2017-07-20 | Disposition: A | Discharge status: Home / Self Care | Payer: Medicare Other | Attending: Surgery | Admitting: Surgery

## 2017-07-20 ENCOUNTER — Other Ambulatory Visit: Payer: Self-pay

## 2017-07-20 ENCOUNTER — Encounter (HOSPITAL_COMMUNITY): Payer: Self-pay

## 2017-07-20 DIAGNOSIS — Z01818 Encounter for other preprocedural examination: Secondary | ICD-10-CM | POA: Diagnosis present

## 2017-07-20 DIAGNOSIS — I5022 Chronic systolic (congestive) heart failure: Secondary | ICD-10-CM | POA: Insufficient documentation

## 2017-07-20 DIAGNOSIS — Z79899 Other long term (current) drug therapy: Secondary | ICD-10-CM | POA: Insufficient documentation

## 2017-07-20 DIAGNOSIS — K219 Gastro-esophageal reflux disease without esophagitis: Secondary | ICD-10-CM | POA: Insufficient documentation

## 2017-07-20 DIAGNOSIS — I340 Nonrheumatic mitral (valve) insufficiency: Secondary | ICD-10-CM

## 2017-07-20 DIAGNOSIS — I110 Hypertensive heart disease with heart failure: Secondary | ICD-10-CM | POA: Diagnosis not present

## 2017-07-20 DIAGNOSIS — I2510 Atherosclerotic heart disease of native coronary artery without angina pectoris: Secondary | ICD-10-CM | POA: Insufficient documentation

## 2017-07-20 DIAGNOSIS — I080 Rheumatic disorders of both mitral and aortic valves: Secondary | ICD-10-CM | POA: Diagnosis not present

## 2017-07-20 DIAGNOSIS — I350 Nonrheumatic aortic (valve) stenosis: Secondary | ICD-10-CM

## 2017-07-20 DIAGNOSIS — Z87891 Personal history of nicotine dependence: Secondary | ICD-10-CM | POA: Insufficient documentation

## 2017-07-20 HISTORY — DX: Heart failure, unspecified: I50.9

## 2017-07-20 LAB — BLOOD GAS, ARTERIAL
Acid-Base Excess: 5.4 mmol/L — ABNORMAL HIGH (ref 0.0–2.0)
Bicarbonate: 28.9 mmol/L — ABNORMAL HIGH (ref 20.0–28.0)
Drawn by: 449841
FIO2: 21
O2 Saturation: 98.7 %
Patient temperature: 98.6
pCO2 arterial: 38.4 mmHg (ref 32.0–48.0)
pH, Arterial: 7.489 — ABNORMAL HIGH (ref 7.350–7.450)
pO2, Arterial: 123 mmHg — ABNORMAL HIGH (ref 83.0–108.0)

## 2017-07-20 LAB — COMPREHENSIVE METABOLIC PANEL
ALT: 75 U/L — ABNORMAL HIGH (ref 17–63)
AST: 104 U/L — ABNORMAL HIGH (ref 15–41)
Albumin: 3.3 g/dL — ABNORMAL LOW (ref 3.5–5.0)
Alkaline Phosphatase: 77 U/L (ref 38–126)
Anion gap: 13 (ref 5–15)
BUN: 16 mg/dL (ref 6–20)
CO2: 23 mmol/L (ref 22–32)
Calcium: 8.9 mg/dL (ref 8.9–10.3)
Chloride: 93 mmol/L — ABNORMAL LOW (ref 101–111)
Creatinine, Ser: 1.01 mg/dL (ref 0.61–1.24)
GFR calc Af Amer: >60 mL/min (ref >60)
GFR calc non Af Amer: >60 mL/min (ref >60)
Glucose, Bld: 91 mg/dL (ref 65–99)
Potassium: 4.5 mmol/L (ref 3.5–5.1)
Sodium: 129 mmol/L — ABNORMAL LOW (ref 135–145)
Total Bilirubin: 0.7 mg/dL (ref 0.3–1.2)
Total Protein: 6.7 g/dL (ref 6.5–8.1)

## 2017-07-20 LAB — URINALYSIS, ROUTINE W REFLEX MICROSCOPIC
Bilirubin Urine: NEGATIVE
Glucose, UA: NEGATIVE mg/dL
Hgb urine dipstick: NEGATIVE
Ketones, ur: NEGATIVE mg/dL
Leukocytes, UA: NEGATIVE
Nitrite: NEGATIVE
Protein, ur: NEGATIVE mg/dL
Specific Gravity, Urine: 1.005 (ref 1.005–1.030)
pH: 7 (ref 5.0–8.0)

## 2017-07-20 LAB — ABO/RH: ABO/RH(D): O POS

## 2017-07-20 LAB — CBC
HCT: 33.8 % — ABNORMAL LOW (ref 39.0–52.0)
Hemoglobin: 11 g/dL — ABNORMAL LOW (ref 13.0–17.0)
MCH: 30.3 pg (ref 26.0–34.0)
MCHC: 32.5 g/dL (ref 30.0–36.0)
MCV: 93.1 fL (ref 78.0–100.0)
Platelets: 315 10*3/uL (ref 150–400)
RBC: 3.63 MIL/uL — ABNORMAL LOW (ref 4.22–5.81)
RDW: 14.4 % (ref 11.5–15.5)
WBC: 6.6 10*3/uL (ref 4.0–10.5)

## 2017-07-20 LAB — APTT: aPTT: 32 seconds (ref 24–36)

## 2017-07-20 LAB — HEMOGLOBIN A1C
Hgb A1c MFr Bld: 5.7 % — ABNORMAL HIGH (ref 4.8–5.6)
Mean Plasma Glucose: 116.89 mg/dL

## 2017-07-20 LAB — PROTIME-INR
INR: 1.02
Prothrombin Time: 13.3 seconds (ref 11.4–15.2)

## 2017-07-20 LAB — SURGICAL PCR SCREEN
MRSA, PCR: NEGATIVE
Staphylococcus aureus: NEGATIVE

## 2017-07-20 NOTE — Progress Notes (Addendum)
PCP: Dr. Yaakov Guthrie @ [facility] Cardiologist: Dr. Virgina Jock @ [facility].  Called abnormal las to Ryan,RN At Dr. Vivi Martens office. Ryan returned called ,received message and will forward labs to surgeon.

## 2017-07-23 MED ORDER — MAGNESIUM SULFATE 50 % IJ SOLN
40.0000 meq | INTRAMUSCULAR | Status: DC
  Filled 2017-07-23: qty 9.85

## 2017-07-23 MED ORDER — POTASSIUM CHLORIDE 2 MEQ/ML IV SOLN
80.0000 meq | INTRAVENOUS | Status: DC
  Filled 2017-07-23: qty 40

## 2017-07-23 MED ORDER — SODIUM CHLORIDE 0.9 % IV SOLN
1.5000 g | INTRAVENOUS | Status: AC
  Administered 2017-07-24: 1.5 g via INTRAVENOUS
  Administered 2017-07-24: .75 g via INTRAVENOUS
  Filled 2017-07-23 (×2): qty 1.5

## 2017-07-23 MED ORDER — TRANEXAMIC ACID (OHS) PUMP PRIME SOLUTION
2.0000 mg/kg | INTRAVENOUS | Status: DC
Start: 2017-07-24 — End: 2017-07-24
  Filled 2017-07-23: qty 1.46

## 2017-07-23 MED ORDER — MILRINONE LACTATE IN DEXTROSE 20-5 MG/100ML-% IV SOLN
0.1250 ug/kg/min | INTRAVENOUS | Status: DC
  Filled 2017-07-23: qty 100

## 2017-07-23 MED ORDER — VANCOMYCIN HCL 10 G IV SOLR
1250.0000 mg | INTRAVENOUS | Status: AC
  Administered 2017-07-24: 1250 mg via INTRAVENOUS
  Filled 2017-07-23: qty 1250

## 2017-07-23 MED ORDER — DEXTROSE 5 % IV SOLN
0.0000 ug/min | INTRAVENOUS | Status: DC
  Filled 2017-07-23: qty 4

## 2017-07-23 MED ORDER — SODIUM CHLORIDE 0.9 % IV SOLN
INTRAVENOUS | Status: AC
  Administered 2017-07-24: .9 [IU]/h via INTRAVENOUS
  Filled 2017-07-23: qty 1

## 2017-07-23 MED ORDER — SODIUM CHLORIDE 0.9 % IV SOLN
750.0000 mg | INTRAVENOUS | Status: DC
  Filled 2017-07-23: qty 750

## 2017-07-23 MED ORDER — PHENYLEPHRINE HCL 10 MG/ML IJ SOLN
30.0000 ug/min | INTRAMUSCULAR | Status: AC
  Administered 2017-07-24: 10 ug/min via INTRAVENOUS
  Filled 2017-07-23: qty 2
  Filled 2017-07-23: qty 20

## 2017-07-23 MED ORDER — DEXMEDETOMIDINE HCL IN NACL 400 MCG/100ML IV SOLN
0.1000 ug/kg/h | INTRAVENOUS | Status: AC
  Administered 2017-07-24: .2 ug/kg/h via INTRAVENOUS
  Filled 2017-07-23: qty 100

## 2017-07-23 MED ORDER — DOPAMINE-DEXTROSE 3.2-5 MG/ML-% IV SOLN
0.0000 ug/kg/min | INTRAVENOUS | Status: AC
  Administered 2017-07-24: 5 ug/kg/min via INTRAVENOUS
  Filled 2017-07-23: qty 250

## 2017-07-23 MED ORDER — TRANEXAMIC ACID 1000 MG/10ML IV SOLN
1.5000 mg/kg/h | INTRAVENOUS | Status: AC
  Administered 2017-07-24: 1.5 mg/kg/h via INTRAVENOUS
  Filled 2017-07-23: qty 25

## 2017-07-23 MED ORDER — TRANEXAMIC ACID (OHS) BOLUS VIA INFUSION
15.0000 mg/kg | INTRAVENOUS | Status: AC
  Administered 2017-07-24: 1093.5 mg via INTRAVENOUS
  Filled 2017-07-23: qty 1094

## 2017-07-23 MED ORDER — PLASMA-LYTE 148 IV SOLN
INTRAVENOUS | Status: DC
  Filled 2017-07-23: qty 2.5

## 2017-07-23 MED ORDER — SODIUM CHLORIDE 0.9 % IV SOLN
INTRAVENOUS | Status: DC
  Filled 2017-07-23: qty 30

## 2017-07-23 MED ORDER — NITROGLYCERIN IN D5W 200-5 MCG/ML-% IV SOLN
2.0000 ug/min | INTRAVENOUS | Status: AC
  Administered 2017-07-24: 10 ug/min via INTRAVENOUS
  Filled 2017-07-23: qty 250

## 2017-07-23 NOTE — H&P (Signed)
[address]       ,[place] [address]             [phone]      Cardiothoracic Surgery Admission History and Physical  Primary Care Provider: Vernie Shanks, MD  Primary Cardiologist: Dr. Virgina Jock   Reason for admission:   Jessy Cybulski is a 77 y.o. male with a hx of HTN, GERD, multifactorial anemia, and recently diagnosed mod MR, mild TR, LV dysfunction with heart failure, severe low gradient aortic stenosis, hyponatremia, and severe multivessel CAD.  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 He lives in [place] with his wife of 18 years. He has one son who lives in [place]. 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 He bicycles, plays disc golf and goes to the gym at least 3 times a week. He doesn't smoke and is mostly a vegetarian. He has a strong family history of early CAD in his father who had his first MI in his 39s. He regularly sees the dentist and has all his native teeth.  Over the past 2-3 months he has been getting tired more easily, but he chalked this up to old age. However, about 2 weeks ago he could not bicycle up a hill he had gone up 100s of times before and had to walk. He also developed exertional shortness of breath and fatigue as well as a cough. The cough got worse in a supine position. He also developed LE edema. He couldn't do household chores like taking out the garbage without tiring out. At that point he knew something was wrong and called his primary medical doctor. His PCP sent him to Dr. Virgina Jock for cardiology consultation.   He says he has known he has had a murmur for the past 20 years, but was remotely seen by cardiologist and told his heart was in excellent condition. He more recently saw Dr. Earnie Larsson who started him on PO lasix and ordered an echocardiogram. This was completed on 06/29/2017 and showed moderate LV dysfunction with EF 35 to 40%, mild concentric  hypertrophy, severe biatrial enlargement, moderate calcification of the aortic valve with severely restricted aortic valve leaflets motion and RCC is fixated, moderate to severe aortic valve stenosis; peak pressure gradient 40 and mean gradient 25 mmHg, AVA 0.69 cm, moderate (Grade III) mitral regurgitation, mild tricuspid regurgitation, mild pulmonary hypertension: PA pressure 31 mmHg, mild pulmonic regurgitation, IVC dilated with poor inspiration collapse consistent with elevated right atrial pressure.  He was seen back in the office for follow up on 07/07/17 and his symptoms were worsening, so he was admitted for IV diuresis and further work up. He was placed on IV Lasix 20mg  BID and diuresed well; 4L, weight 175--> 173 lbs with huge symptomatic relief. Labs revealed iron deficiency anemia with a component of anemia of chronic disease, hyponatremia which was felt to be dilutional as well as hypoalbunemia and mild transaminitis felt to be secondary to congestion. He underwent L/RHC w/ dobutamine challenge which showed severe multivessel CAD: pLAD calcified 80% stenosis, LCx tortuous, severely calcified, prox LCx 80%, mid LCx 80-90% stenoses, RCA dominant, but relatively small vessel with prox & mid 80% stenoses, and severe low flow low gradient aortic stenosis (Mean PG at baseline: 22 mmHg, Mean PG with dobutamine 10 mcg/kg/min: 45 mmHg). He felt much better after diuresis but still has residual LE edema. He admits  to occasional dizziness but no syncope. He has had no chest pain.      Past Medical History:  Diagnosis Date  . Gout   . Hepatitis 1964   hepatitis A   . Hypertension         Past Surgical History:  Procedure Laterality Date  . EYE SURGERY Bilateral    lens replacements for cataracts  . HERNIA REPAIR    . INGUINAL HERNIA REPAIR Right 05/06/2015   Procedure: LAPAROSCOPIC RIGHT INGUINAL HERNIA WITH MESH; Surgeon: Coralie Keens, MD; Location: [facility]; Service: General; Laterality: Right;    . INSERTION OF MESH Right 05/06/2015   Procedure: INSERTION OF MESH; Surgeon: Coralie Keens, MD; Location: [facility]; Service: General; Laterality: Right;   Inpatient Medications:  Scheduled Meds:  . feeding supplement (PRO-STAT SUGAR FREE 64) 30 mL Oral TID BM  . furosemide 20 mg Intravenous Q8H  . heparin 5,000 Units Subcutaneous Q8H  . pantoprazole 20 mg Oral Q1200  . potassium chloride 20 mEq Oral BID  . sodium chloride flush 3 mL Intravenous Q12H  . tamsulosin 0.4 mg Oral QPC supper   Continuous Infusions:  . sodium chloride   . DOBUTamine Stopped (07/10/17 1011)  . ferumoxytol Stopped (07/09/17 1240)   PRN Meds:  sodium chloride, acetaminophen, promethazine **AND** dextromethorphan, docusate sodium, ondansetron (ZOFRAN) IV, sodium chloride flush  Allergies: No Known Allergies  Social History:  Social History        Socioeconomic History  . Marital status: Married    Spouse name: Not on file  . Number of children: Not on file  . Years of education: Not on file  . Highest education level: Not on file  Occupational History  . Not on file  Social Needs  . Financial resource strain: Not on file  . Food insecurity:    Worry: Not on file    Inability: Not on file  . Transportation needs:    Medical: Not on file    Non-medical: Not on file  Tobacco Use  . Smoking status: Former Research scientist (life sciences)  . Smokeless tobacco: Never Used  Substance and Sexual Activity  . Alcohol use: Yes  . Drug use: No  . Sexual activity: Not on file  Lifestyle  . Physical activity:    Days per week: Not on file    Minutes per session: Not on file  . Stress: Not on file  Relationships  . Social connections:    Talks on phone: Not on file    Gets together: Not on file    Attends religious service: Not on file    Active member of club or organization: Not on file    Attends meetings of clubs or organizations: Not on file    Relationship status: Not on file  . Intimate partner violence:    Fear  of current or ex partner: Not on file    Emotionally abused: Not on file    Physically abused: Not on file    Forced sexual activity: Not on file  Other Topics Concern  . Not on file  Social History Narrative  . Not on file   Family History:  The patient's family history includes Heart attack in his father; Stroke in his mother.  ROS:  Please see the history of present illness.  ROS  All other ROS reviewed and negative.  Physical Exam/Data:         Vitals:   07/10/17 1020 07/10/17 1025 07/10/17 1030 07/10/17 1035  BP: 133/83  Pulse: 65 77 74 76  Resp: 14 16 14 15   Temp:      TempSrc:      SpO2: 96% 96% 98% 97%  Weight:      Height:        Intake/Output Summary (Last 24 hours) at 07/10/2017 1128  Last data filed at 07/10/2017 0410     Gross per 24 hour  Intake 622 ml  Output 2675 ml  Net -2053 ml        Filed Weights   07/08/17 0305 07/09/17 0543 07/10/17 0343  Weight: 176 lb 5.9 oz (80 kg) 175 lb 11.3 oz (79.7 kg) 173 lb 1 oz (78.5 kg)   Body mass index is 23.47 kg/m.  General: Thin, white male in no acute distress  HEENT: normal  Lymph: no adenopathy  Neck: no JVD  Endocrine: No thryomegaly  Vascular: No carotid bruits; FA pulses 2+ bilaterally without bruits  Cardiac: normal S1, S2; RRR; 3/6 SEM @ RUSB, 3/6 holosytolic murmur at apex  Lungs: rales at bases  Abd: soft, nontender, no hepatomegaly  Ext: 2 + bilateral LE edema  Musculoskeletal: No deformities, BUE and BLE strength normal and equal  Skin: warm and dry  Neuro: CNs 2-12 intact, no focal abnormalities noted  Psych: Normal affect  EKG: The EKG was personally reviewed and demonstrates: sinus with PVCs  Telemetry: Telemetry was personally reviewed and demonstrates: Sinus with PVCs  Relevant CV Studies:  2D ECHO 06/29/2017:  Left ventricle cavity is normal in size. Mild concentric hypertrophy of the left ventricle. Moderate decrease in LV systolic function with global hypokinesis. No regional wall  motion abnormality. Visual EF is 35-40%. Calculated EF 40%.  Left atrial cavity is severely dilated at 4.5 cm.  Right atrial cavity is severely dilated.  No aortic valve regurgitation noted. Moderate calcification of the aortic valve with severely restricted aortic valve leaflets motion and RCC is fixated. 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 Aortic valve peak pressure gradient of 40 and mean gradient of 25 mmHg, calculated aortic valve area 0.69 cm.  Moderate (Grade III) mitral regurgitation.  Mild tricuspid regurgitation. Mild pulmonary hypertension. Estimated pulmonary artery systolic pressure 31 mmHg  Mild pulmonic regurgitation.  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  [facility] 07/10/17  Left and right heart cath w/dobutamine challenge, coronary angiography  LM: Normal  LAD: Prox LAD calcified 80% stenosis  LCx: Tortuous, severely calcified, prox LCx 80%, mid LCx 80-90% stenoses  RCA: Dominant, but relatively small vessel with prox & mid 80-% stenoses  Mean PG at baseline: 22 mmHg  Mean PG with dobutamine 10 mcg/kg/min: 45 mmHg  Severe multivessel CAD  Severe low flow low gradient aortic stenosis  Will discuss with CVTS/TAVR team  Laboratory Data:  [place]  Hematology  Last Labs                                                              Cardiac Enzymes  Last Labs      Last Labs      BNP  Last Labs      DDimer  Last Labs      Radiology/Studies:  Dg Chest 2 View  Result Date: 07/08/2017  CLINICAL DATA: Shortness of breath for 1-2 weeks. EXAM: CHEST - 2 VIEW COMPARISON: None. FINDINGS: There is extensive pulmonary edema and trace bilateral pleural effusions. Heart size is upper normal. Extensive atherosclerotic vascular disease is  present. No pneumothorax. No acute bony abnormality. IMPRESSION: Congestive heart failure. Electronically Signed By: Inge Rise M.D. On: 07/08/2017 09:14   TEE: Under moderate sedation, TEE was performed without complications:  LV: EF 29-92%  RV: Normal  LA: Moderately dilated. Left atrial appendage: Normal without thrombus. Reduced Doppler velocity. Inter atrial septum is intact without defect based on 2D and Doppler evaluation. Marland Kitchen  RA: Normal  MV: Moderate secondary MR. Systolic blunting in all pulmonary veins  TV: Mild to moderate TR. Peak gradient 37 mmHg  AV: Severely calcified. Peak vel 3.65 m/sec AVA 0.6 cm2  PV: Mild PI  Thoracic and ascending aorta: Moderate atheromatous plaque.  Conscious sedation protocol was followed, I personally administered conscious sedation and monitored the patient. Patient received 1 milligrams of Versed and 25 mcg fentanyl. Patient tolerated the procedure well and there was no complication from conscious sedation. Time administered was 30 min and procedure ended at 11:15 PM.  Nigel Mormon, MD  [facility]  Pager: [phone]  Office: [phone]  If no answer Cell [phone]    Assessment and Plan:   Dyllen Menning is a 76 y.o. male with symptoms of severe, stage D2 aortic stenosis with NYHA Class IV symptoms. He is a very active gentleman who had been exercising daily until recently.  He has a 2 month history of decreased stamina and exertional shortness of breath that progressed over the past several weeks.  His echo showed a trileaflet aortic valve that was calcifed with reduced mobility and a mean gradient of 25 mm Hg, reduced EF of 35-40% and moderate MR. Cath shows severe 3 vessel CAD. His mean gradient across the aortic valve increased to 45 mm Hg on 10 mcg of dobutamine consistent with low gradient, low EF severe AS. TEE today confirms a severely calcified AV with reduced mobility with a peak velocity of 3.65 m/sec and  an AVA of 0.6 cm2. There is moderate secondary MR. LVEF is 30-35%. I think the best treatment for him is CABG/AVR/MV repair with clipping of the LAA. His coronary disease is best treated with CABG, not PCI. I discussed the operative procedure with the patient and his wife including alternatives, benefits and risks; including but not limited to bleeding, blood transfusion, infection, stroke, myocardial infarction, graft failure, heart block requiring a permanent pacemaker, organ dysfunction, and death.  [name] understands and agrees to proceed.

## 2017-07-23 NOTE — Anesthesia Preprocedure Evaluation (Addendum)
Anesthesia Evaluation  Patient identified by MRN, date of birth, ID band Patient awake    Reviewed: Allergy & Precautions, H&P , NPO status , Patient's Chart, lab work & pertinent test results  Airway Mallampati: II  TM Distance: >3 FB Neck ROM: Full    Dental no notable dental hx. (+) Teeth Intact, Dental Advisory Given   Pulmonary neg pulmonary ROS, former smoker,    Pulmonary exam normal breath sounds clear to auscultation       Cardiovascular Exercise Tolerance: Good hypertension, + CAD and +CHF  + Valvular Problems/Murmurs AS and MR  Rhythm:Regular Rate:Normal + Systolic murmurs    Neuro/Psych negative neurological ROS  negative psych ROS   GI/Hepatic Neg liver ROS, GERD  Controlled,  Endo/Other  negative endocrine ROS  Renal/GU negative Renal ROS  negative genitourinary   Musculoskeletal   Abdominal   Peds  Hematology negative hematology ROS (+) anemia ,   Anesthesia Other Findings   Reproductive/Obstetrics negative OB ROS                            Anesthesia Physical Anesthesia Plan  ASA: IV  Anesthesia Plan: General   Post-op Pain Management:    Induction: Intravenous  PONV Risk Score and Plan: 2 and Midazolam and Treatment may vary due to age or medical condition  Airway Management Planned: Oral ETT  Additional Equipment: Arterial line, CVP, PA Cath, TEE, 3D TEE and Ultrasound Guidance Line Placement  Intra-op Plan:   Post-operative Plan: Post-operative intubation/ventilation  Informed Consent: I have reviewed the patients History and Physical, chart, labs and discussed the procedure including the risks, benefits and alternatives for the proposed anesthesia with the patient or authorized representative who has indicated his/her understanding and acceptance.   Dental advisory given  Plan Discussed with: CRNA  Anesthesia Plan Comments:         Anesthesia  Quick Evaluation

## 2017-07-24 ENCOUNTER — Inpatient Hospital Stay (HOSPITAL_COMMUNITY): Payer: Medicare Other

## 2017-07-24 ENCOUNTER — Other Ambulatory Visit: Payer: Self-pay

## 2017-07-24 ENCOUNTER — Inpatient Hospital Stay (HOSPITAL_COMMUNITY)
Admission: RE | Admit: 2017-07-24 | Discharge: 2017-07-29 | DRG: 219 | Disposition: A | Discharge status: Home Health | Payer: Medicare Other | Source: Ambulatory Visit | Attending: Surgery | Admitting: Surgery

## 2017-07-24 ENCOUNTER — Inpatient Hospital Stay (HOSPITAL_COMMUNITY): Payer: Medicare Other | Admitting: Anesthesiology

## 2017-07-24 ENCOUNTER — Inpatient Hospital Stay (HOSPITAL_COMMUNITY)
Admission: RE | Disposition: A | Discharge status: Home Health | Payer: Self-pay | Source: Ambulatory Visit | Attending: Surgery

## 2017-07-24 ENCOUNTER — Inpatient Hospital Stay (HOSPITAL_COMMUNITY): Payer: Medicare Other | Admitting: Vascular Surgery

## 2017-07-24 ENCOUNTER — Encounter (HOSPITAL_COMMUNITY): Payer: Self-pay

## 2017-07-24 DIAGNOSIS — D62 Acute posthemorrhagic anemia: Secondary | ICD-10-CM | POA: Diagnosis not present

## 2017-07-24 DIAGNOSIS — I083 Combined rheumatic disorders of mitral, aortic and tricuspid valves: Principal | ICD-10-CM | POA: Diagnosis present

## 2017-07-24 DIAGNOSIS — I2510 Atherosclerotic heart disease of native coronary artery without angina pectoris: Secondary | ICD-10-CM | POA: Diagnosis present

## 2017-07-24 DIAGNOSIS — Z9889 Other specified postprocedural states: Secondary | ICD-10-CM

## 2017-07-24 DIAGNOSIS — I272 Pulmonary hypertension, unspecified: Secondary | ICD-10-CM | POA: Diagnosis present

## 2017-07-24 DIAGNOSIS — M109 Gout, unspecified: Secondary | ICD-10-CM | POA: Diagnosis present

## 2017-07-24 DIAGNOSIS — Z9842 Cataract extraction status, left eye: Secondary | ICD-10-CM

## 2017-07-24 DIAGNOSIS — I340 Nonrheumatic mitral (valve) insufficiency: Secondary | ICD-10-CM

## 2017-07-24 DIAGNOSIS — Z952 Presence of prosthetic heart valve: Secondary | ICD-10-CM

## 2017-07-24 DIAGNOSIS — I081 Rheumatic disorders of both mitral and tricuspid valves: Secondary | ICD-10-CM | POA: Diagnosis present

## 2017-07-24 DIAGNOSIS — R011 Cardiac murmur, unspecified: Secondary | ICD-10-CM | POA: Diagnosis present

## 2017-07-24 DIAGNOSIS — I350 Nonrheumatic aortic (valve) stenosis: Secondary | ICD-10-CM | POA: Diagnosis not present

## 2017-07-24 DIAGNOSIS — D638 Anemia in other chronic diseases classified elsewhere: Secondary | ICD-10-CM | POA: Diagnosis present

## 2017-07-24 DIAGNOSIS — I110 Hypertensive heart disease with heart failure: Secondary | ICD-10-CM | POA: Diagnosis present

## 2017-07-24 DIAGNOSIS — R740 Nonspecific elevation of levels of transaminase and lactic acid dehydrogenase [LDH]: Secondary | ICD-10-CM | POA: Diagnosis present

## 2017-07-24 DIAGNOSIS — K219 Gastro-esophageal reflux disease without esophagitis: Secondary | ICD-10-CM | POA: Diagnosis present

## 2017-07-24 DIAGNOSIS — I5023 Acute on chronic systolic (congestive) heart failure: Secondary | ICD-10-CM | POA: Diagnosis present

## 2017-07-24 DIAGNOSIS — D696 Thrombocytopenia, unspecified: Secondary | ICD-10-CM | POA: Diagnosis present

## 2017-07-24 DIAGNOSIS — Z9841 Cataract extraction status, right eye: Secondary | ICD-10-CM

## 2017-07-24 DIAGNOSIS — Z823 Family history of stroke: Secondary | ICD-10-CM

## 2017-07-24 DIAGNOSIS — Z87891 Personal history of nicotine dependence: Secondary | ICD-10-CM | POA: Diagnosis not present

## 2017-07-24 DIAGNOSIS — Z8249 Family history of ischemic heart disease and other diseases of the circulatory system: Secondary | ICD-10-CM | POA: Diagnosis not present

## 2017-07-24 DIAGNOSIS — E871 Hypo-osmolality and hyponatremia: Secondary | ICD-10-CM | POA: Diagnosis present

## 2017-07-24 DIAGNOSIS — Z951 Presence of aortocoronary bypass graft: Secondary | ICD-10-CM

## 2017-07-24 DIAGNOSIS — D509 Iron deficiency anemia, unspecified: Secondary | ICD-10-CM | POA: Diagnosis present

## 2017-07-24 HISTORY — PX: AORTIC VALVE REPLACEMENT: SHX41

## 2017-07-24 HISTORY — PX: CORONARY ARTERY BYPASS GRAFT: SHX141

## 2017-07-24 HISTORY — PX: TEE WITHOUT CARDIOVERSION: SHX5443

## 2017-07-24 HISTORY — PX: CLIPPING OF ATRIAL APPENDAGE: SHX5773

## 2017-07-24 LAB — POCT I-STAT, CHEM 8
BUN: 23 mg/dL — ABNORMAL HIGH (ref 6–20)
BUN: 20 mg/dL (ref 6–20)
BUN: 20 mg/dL (ref 6–20)
BUN: 20 mg/dL (ref 6–20)
BUN: 20 mg/dL (ref 6–20)
BUN: 19 mg/dL (ref 6–20)
BUN: 21 mg/dL — ABNORMAL HIGH (ref 6–20)
BUN: 19 mg/dL (ref 6–20)
Calcium, Ion: 1.21 mmol/L (ref 1.15–1.40)
Calcium, Ion: 1.19 mmol/L (ref 1.15–1.40)
Calcium, Ion: 1.12 mmol/L — ABNORMAL LOW (ref 1.15–1.40)
Calcium, Ion: 1.13 mmol/L — ABNORMAL LOW (ref 1.15–1.40)
Calcium, Ion: 1.17 mmol/L (ref 1.15–1.40)
Calcium, Ion: 1.08 mmol/L — ABNORMAL LOW (ref 1.15–1.40)
Calcium, Ion: 1.11 mmol/L — ABNORMAL LOW (ref 1.15–1.40)
Calcium, Ion: 1.15 mmol/L (ref 1.15–1.40)
Chloride: 91 mmol/L — ABNORMAL LOW (ref 101–111)
Chloride: 94 mmol/L — ABNORMAL LOW (ref 101–111)
Chloride: 91 mmol/L — ABNORMAL LOW (ref 101–111)
Chloride: 92 mmol/L — ABNORMAL LOW (ref 101–111)
Chloride: 94 mmol/L — ABNORMAL LOW (ref 101–111)
Chloride: 97 mmol/L — ABNORMAL LOW (ref 101–111)
Chloride: 99 mmol/L — ABNORMAL LOW (ref 101–111)
Chloride: 99 mmol/L — ABNORMAL LOW (ref 101–111)
Creatinine, Ser: 0.9 mg/dL (ref 0.61–1.24)
Creatinine, Ser: 0.8 mg/dL (ref 0.61–1.24)
Creatinine, Ser: 0.8 mg/dL (ref 0.61–1.24)
Creatinine, Ser: 0.8 mg/dL (ref 0.61–1.24)
Creatinine, Ser: 0.7 mg/dL (ref 0.61–1.24)
Creatinine, Ser: 0.7 mg/dL (ref 0.61–1.24)
Creatinine, Ser: 0.7 mg/dL (ref 0.61–1.24)
Creatinine, Ser: 0.8 mg/dL (ref 0.61–1.24)
Glucose, Bld: 103 mg/dL — ABNORMAL HIGH (ref 65–99)
Glucose, Bld: 98 mg/dL (ref 65–99)
Glucose, Bld: 100 mg/dL — ABNORMAL HIGH (ref 65–99)
Glucose, Bld: 129 mg/dL — ABNORMAL HIGH (ref 65–99)
Glucose, Bld: 150 mg/dL — ABNORMAL HIGH (ref 65–99)
Glucose, Bld: 130 mg/dL — ABNORMAL HIGH (ref 65–99)
Glucose, Bld: 161 mg/dL — ABNORMAL HIGH (ref 65–99)
Glucose, Bld: 137 mg/dL — ABNORMAL HIGH (ref 65–99)
HCT: 30 % — ABNORMAL LOW (ref 39.0–52.0)
HCT: 26 % — ABNORMAL LOW (ref 39.0–52.0)
HCT: 29 % — ABNORMAL LOW (ref 39.0–52.0)
HCT: 26 % — ABNORMAL LOW (ref 39.0–52.0)
HCT: 25 % — ABNORMAL LOW (ref 39.0–52.0)
HCT: 26 % — ABNORMAL LOW (ref 39.0–52.0)
HCT: 27 % — ABNORMAL LOW (ref 39.0–52.0)
HCT: 29 % — ABNORMAL LOW (ref 39.0–52.0)
Hemoglobin: 10.2 g/dL — ABNORMAL LOW (ref 13.0–17.0)
Hemoglobin: 8.8 g/dL — ABNORMAL LOW (ref 13.0–17.0)
Hemoglobin: 9.9 g/dL — ABNORMAL LOW (ref 13.0–17.0)
Hemoglobin: 8.8 g/dL — ABNORMAL LOW (ref 13.0–17.0)
Hemoglobin: 8.5 g/dL — ABNORMAL LOW (ref 13.0–17.0)
Hemoglobin: 8.8 g/dL — ABNORMAL LOW (ref 13.0–17.0)
Hemoglobin: 9.2 g/dL — ABNORMAL LOW (ref 13.0–17.0)
Hemoglobin: 9.9 g/dL — ABNORMAL LOW (ref 13.0–17.0)
Potassium: 3.9 mmol/L (ref 3.5–5.1)
Potassium: 3.8 mmol/L (ref 3.5–5.1)
Potassium: 4.1 mmol/L (ref 3.5–5.1)
Potassium: 4.5 mmol/L (ref 3.5–5.1)
Potassium: 4.8 mmol/L (ref 3.5–5.1)
Potassium: 4.1 mmol/L (ref 3.5–5.1)
Potassium: 4.8 mmol/L (ref 3.5–5.1)
Potassium: 4.6 mmol/L (ref 3.5–5.1)
Sodium: 131 mmol/L — ABNORMAL LOW (ref 135–145)
Sodium: 131 mmol/L — ABNORMAL LOW (ref 135–145)
Sodium: 132 mmol/L — ABNORMAL LOW (ref 135–145)
Sodium: 131 mmol/L — ABNORMAL LOW (ref 135–145)
Sodium: 130 mmol/L — ABNORMAL LOW (ref 135–145)
Sodium: 133 mmol/L — ABNORMAL LOW (ref 135–145)
Sodium: 135 mmol/L (ref 135–145)
Sodium: 135 mmol/L (ref 135–145)
TCO2: 32 mmol/L (ref 22–32)
TCO2: 30 mmol/L (ref 22–32)
TCO2: 27 mmol/L (ref 22–32)
TCO2: 27 mmol/L (ref 22–32)
TCO2: 27 mmol/L (ref 22–32)
TCO2: 25 mmol/L (ref 22–32)
TCO2: 26 mmol/L (ref 22–32)
TCO2: 23 mmol/L (ref 22–32)

## 2017-07-24 LAB — POCT I-STAT 3, ART BLOOD GAS (G3+)
Acid-Base Excess: 5 mmol/L — ABNORMAL HIGH (ref 0.0–2.0)
Acid-base deficit: 2 mmol/L (ref 0.0–2.0)
Acid-base deficit: 4 mmol/L — ABNORMAL HIGH (ref 0.0–2.0)
Bicarbonate: 29.5 mmol/L — ABNORMAL HIGH (ref 20.0–28.0)
Bicarbonate: 25.3 mmol/L (ref 20.0–28.0)
Bicarbonate: 24.2 mmol/L (ref 20.0–28.0)
Bicarbonate: 22.2 mmol/L (ref 20.0–28.0)
Bicarbonate: 25.1 mmol/L (ref 20.0–28.0)
Bicarbonate: 26.1 mmol/L (ref 20.0–28.0)
O2 Saturation: 100 %
O2 Saturation: 100 %
O2 Saturation: 97 %
O2 Saturation: 98 %
O2 Saturation: 100 %
O2 Saturation: 99 %
Patient temperature: 36.4
Patient temperature: 36.1
Patient temperature: 36.1
Patient temperature: 36.4
TCO2: 31 mmol/L (ref 22–32)
TCO2: 27 mmol/L (ref 22–32)
TCO2: 26 mmol/L (ref 22–32)
TCO2: 24 mmol/L (ref 22–32)
TCO2: 26 mmol/L (ref 22–32)
TCO2: 28 mmol/L (ref 22–32)
pCO2 arterial: 42.6 mmHg (ref 32.0–48.0)
pCO2 arterial: 46 mmHg (ref 32.0–48.0)
pCO2 arterial: 43.2 mmHg (ref 32.0–48.0)
pCO2 arterial: 43.2 mmHg (ref 32.0–48.0)
pCO2 arterial: 42.5 mmHg (ref 32.0–48.0)
pCO2 arterial: 45.7 mmHg (ref 32.0–48.0)
pH, Arterial: 7.449 (ref 7.350–7.450)
pH, Arterial: 7.349 — ABNORMAL LOW (ref 7.350–7.450)
pH, Arterial: 7.354 (ref 7.350–7.450)
pH, Arterial: 7.314 — ABNORMAL LOW (ref 7.350–7.450)
pH, Arterial: 7.375 (ref 7.350–7.450)
pH, Arterial: 7.363 (ref 7.350–7.450)
pO2, Arterial: 402 mmHg — ABNORMAL HIGH (ref 83.0–108.0)
pO2, Arterial: 411 mmHg — ABNORMAL HIGH (ref 83.0–108.0)
pO2, Arterial: 96 mmHg (ref 83.0–108.0)
pO2, Arterial: 118 mmHg — ABNORMAL HIGH (ref 83.0–108.0)
pO2, Arterial: 184 mmHg — ABNORMAL HIGH (ref 83.0–108.0)
pO2, Arterial: 122 mmHg — ABNORMAL HIGH (ref 83.0–108.0)

## 2017-07-24 LAB — GLUCOSE, CAPILLARY
Glucose-Capillary: 102 mg/dL — ABNORMAL HIGH (ref 65–99)
Glucose-Capillary: 102 mg/dL — ABNORMAL HIGH (ref 65–99)
Glucose-Capillary: 135 mg/dL — ABNORMAL HIGH (ref 65–99)
Glucose-Capillary: 96 mg/dL (ref 65–99)
Glucose-Capillary: 98 mg/dL (ref 65–99)

## 2017-07-24 LAB — CBC
HCT: 36.3 % — ABNORMAL LOW (ref 39.0–52.0)
HCT: 31.9 % — ABNORMAL LOW (ref 39.0–52.0)
HCT: 30.6 % — ABNORMAL LOW (ref 39.0–52.0)
Hemoglobin: 11.9 g/dL — ABNORMAL LOW (ref 13.0–17.0)
Hemoglobin: 10.3 g/dL — ABNORMAL LOW (ref 13.0–17.0)
Hemoglobin: 10 g/dL — ABNORMAL LOW (ref 13.0–17.0)
MCH: 30.7 pg (ref 26.0–34.0)
MCH: 30.5 pg (ref 26.0–34.0)
MCH: 31 pg (ref 26.0–34.0)
MCHC: 32.8 g/dL (ref 30.0–36.0)
MCHC: 32.3 g/dL (ref 30.0–36.0)
MCHC: 32.7 g/dL (ref 30.0–36.0)
MCV: 93.8 fL (ref 78.0–100.0)
MCV: 94.4 fL (ref 78.0–100.0)
MCV: 94.7 fL (ref 78.0–100.0)
Platelets: 200 10*3/uL (ref 150–400)
Platelets: 179 10*3/uL (ref 150–400)
Platelets: 169 10*3/uL (ref 150–400)
RBC: 3.87 MIL/uL — ABNORMAL LOW (ref 4.22–5.81)
RBC: 3.38 MIL/uL — ABNORMAL LOW (ref 4.22–5.81)
RBC: 3.23 MIL/uL — ABNORMAL LOW (ref 4.22–5.81)
RDW: 14.5 % (ref 11.5–15.5)
RDW: 14.3 % (ref 11.5–15.5)
RDW: 14.3 % (ref 11.5–15.5)
WBC: 13.9 10*3/uL — ABNORMAL HIGH (ref 4.0–10.5)
WBC: 9.7 10*3/uL (ref 4.0–10.5)
WBC: 9.4 10*3/uL (ref 4.0–10.5)

## 2017-07-24 LAB — CREATININE, SERUM
Creatinine, Ser: 0.98 mg/dL (ref 0.61–1.24)
GFR calc Af Amer: >60 mL/min (ref >60)
GFR calc non Af Amer: >60 mL/min (ref >60)

## 2017-07-24 LAB — PREPARE RBC (CROSSMATCH)

## 2017-07-24 LAB — PROTIME-INR
INR: 1.42
Prothrombin Time: 17.2 seconds — ABNORMAL HIGH (ref 11.4–15.2)

## 2017-07-24 LAB — HEMOGLOBIN AND HEMATOCRIT, BLOOD
HCT: 26.7 % — ABNORMAL LOW (ref 39.0–52.0)
Hemoglobin: 8.7 g/dL — ABNORMAL LOW (ref 13.0–17.0)

## 2017-07-24 LAB — MAGNESIUM: Magnesium: 2.9 mg/dL — ABNORMAL HIGH (ref 1.7–2.4)

## 2017-07-24 LAB — APTT: aPTT: 31 seconds (ref 24–36)

## 2017-07-24 LAB — PLATELET COUNT: Platelets: 212 10*3/uL (ref 150–400)

## 2017-07-24 SURGERY — CORONARY ARTERY BYPASS GRAFTING (CABG)
Anesthesia: General | Site: Chest

## 2017-07-24 MED ORDER — SODIUM CHLORIDE 0.9 % IV SOLN: 250.0000 mL | INTRAVENOUS | Status: DC

## 2017-07-24 MED ORDER — TRAMADOL HCL 50 MG PO TABS: 50.0000 mg | ORAL_TABLET | ORAL | Status: DC | PRN

## 2017-07-24 MED ORDER — FAMOTIDINE IN NACL 20-0.9 MG/50ML-% IV SOLN
20.0000 mg | Freq: Two times a day (BID) | INTRAVENOUS | Status: DC
  Administered 2017-07-24: 20 mg via INTRAVENOUS

## 2017-07-24 MED ORDER — SODIUM CHLORIDE 0.45 % IV SOLN
INTRAVENOUS | Status: DC | PRN
  Administered 2017-07-24: 14:00:00 via INTRAVENOUS

## 2017-07-24 MED ORDER — NITROGLYCERIN IN D5W 200-5 MCG/ML-% IV SOLN: 0.0000 ug/min | INTRAVENOUS | Status: DC

## 2017-07-24 MED ORDER — CHLORHEXIDINE GLUCONATE 0.12 % MT SOLN
OROMUCOSAL | Status: AC
  Filled 2017-07-24: qty 15

## 2017-07-24 MED ORDER — ROCURONIUM BROMIDE 100 MG/10ML IV SOLN
INTRAVENOUS | Status: DC | PRN
  Administered 2017-07-24: 15 mg via INTRAVENOUS
  Administered 2017-07-24: 50 mg via INTRAVENOUS
  Administered 2017-07-24: 100 mg via INTRAVENOUS
  Administered 2017-07-24: 50 mg via INTRAVENOUS
  Administered 2017-07-24: 10 mg via INTRAVENOUS
  Administered 2017-07-24: 25 mg via INTRAVENOUS

## 2017-07-24 MED ORDER — LACTATED RINGERS IV SOLN
INTRAVENOUS | Status: DC | PRN
  Administered 2017-07-24: 07:00:00 via INTRAVENOUS

## 2017-07-24 MED ORDER — PROPOFOL 10 MG/ML IV BOLUS
INTRAVENOUS | Status: DC | PRN
  Administered 2017-07-24: 50 mg via INTRAVENOUS

## 2017-07-24 MED ORDER — ASPIRIN EC 325 MG PO TBEC
325.0000 mg | DELAYED_RELEASE_TABLET | Freq: Every day | ORAL | Status: DC
  Administered 2017-07-25 – 2017-07-29 (×5): 325 mg via ORAL
  Filled 2017-07-24 (×5): qty 1

## 2017-07-24 MED ORDER — BISACODYL 10 MG RE SUPP: 10.0000 mg | Freq: Every day | RECTAL | Status: DC

## 2017-07-24 MED ORDER — SODIUM CHLORIDE 0.9% FLUSH: 10.0000 mL | INTRAVENOUS | Status: DC | PRN

## 2017-07-24 MED ORDER — ONDANSETRON HCL 4 MG/2ML IJ SOLN: 4.0000 mg | Freq: Four times a day (QID) | INTRAMUSCULAR | Status: DC | PRN

## 2017-07-24 MED ORDER — ALBUMIN HUMAN 5 % IV SOLN
250.0000 mL | INTRAVENOUS | Status: AC | PRN
  Administered 2017-07-24 (×4): 250 mL via INTRAVENOUS
  Filled 2017-07-24 (×2): qty 250

## 2017-07-24 MED ORDER — METOCLOPRAMIDE HCL 5 MG/ML IJ SOLN
10.0000 mg | Freq: Four times a day (QID) | INTRAMUSCULAR | Status: DC
  Administered 2017-07-24 – 2017-07-25 (×3): 10 mg via INTRAVENOUS
  Filled 2017-07-24 (×2): qty 2

## 2017-07-24 MED ORDER — SODIUM CHLORIDE 0.9 % IV SOLN
Freq: Once | INTRAVENOUS | Status: AC
  Administered 2017-07-24 (×2): via INTRAVENOUS

## 2017-07-24 MED ORDER — ALLOPURINOL 100 MG PO TABS
200.0000 mg | ORAL_TABLET | Freq: Every morning | ORAL | Status: DC
  Administered 2017-07-25 – 2017-07-29 (×5): 200 mg via ORAL
  Filled 2017-07-24 (×5): qty 2

## 2017-07-24 MED ORDER — MIDAZOLAM HCL 2 MG/2ML IJ SOLN
2.0000 mg | INTRAMUSCULAR | Status: DC | PRN
  Filled 2017-07-24 (×2): qty 2

## 2017-07-24 MED ORDER — CHLORHEXIDINE GLUCONATE CLOTH 2 % EX PADS
6.0000 | MEDICATED_PAD | Freq: Every day | CUTANEOUS | Status: DC
  Administered 2017-07-24 – 2017-07-25 (×2): 6 via TOPICAL

## 2017-07-24 MED ORDER — DEXMEDETOMIDINE HCL IN NACL 200 MCG/50ML IV SOLN
0.0000 ug/kg/h | INTRAVENOUS | Status: DC
  Filled 2017-07-24: qty 50

## 2017-07-24 MED ORDER — PROPOFOL 10 MG/ML IV BOLUS
INTRAVENOUS | Status: AC
  Filled 2017-07-24: qty 20

## 2017-07-24 MED ORDER — SODIUM CHLORIDE 0.9 % IV SOLN
0.0000 ug/min | INTRAVENOUS | Status: DC
  Filled 2017-07-24: qty 2

## 2017-07-24 MED ORDER — ASPIRIN 81 MG PO CHEW
324.0000 mg | CHEWABLE_TABLET | Freq: Every day | ORAL | Status: DC
  Filled 2017-07-24: qty 4

## 2017-07-24 MED ORDER — METOPROLOL TARTRATE 12.5 MG HALF TABLET
12.5000 mg | ORAL_TABLET | Freq: Once | ORAL | Status: AC
  Administered 2017-07-24: 12.5 mg via ORAL

## 2017-07-24 MED ORDER — THROMBIN 20000 UNITS EX SOLR
CUTANEOUS | Status: AC
  Filled 2017-07-24: qty 20000

## 2017-07-24 MED ORDER — PHENYLEPHRINE HCL 10 MG/ML IJ SOLN
INTRAMUSCULAR | Status: AC
  Filled 2017-07-24: qty 1

## 2017-07-24 MED ORDER — THROMBIN 20000 UNITS EX SOLR
CUTANEOUS | Status: DC | PRN
  Administered 2017-07-24: 20000 [IU] via TOPICAL

## 2017-07-24 MED ORDER — INSULIN ASPART 100 UNIT/ML ~~LOC~~ SOLN
0.0000 [IU] | SUBCUTANEOUS | Status: DC
  Administered 2017-07-24: 2 [IU] via SUBCUTANEOUS
  Administered 2017-07-25 (×2): 4 [IU] via SUBCUTANEOUS

## 2017-07-24 MED ORDER — PHENYLEPHRINE 40 MCG/ML (10ML) SYRINGE FOR IV PUSH (FOR BLOOD PRESSURE SUPPORT)
PREFILLED_SYRINGE | INTRAVENOUS | Status: AC
  Filled 2017-07-24: qty 10

## 2017-07-24 MED ORDER — FENTANYL CITRATE (PF) 250 MCG/5ML IJ SOLN
INTRAMUSCULAR | Status: AC
  Filled 2017-07-24: qty 5

## 2017-07-24 MED ORDER — TAMSULOSIN HCL 0.4 MG PO CAPS
0.4000 mg | ORAL_CAPSULE | Freq: Every day | ORAL | Status: DC
  Administered 2017-07-25 – 2017-07-29 (×5): 0.4 mg via ORAL
  Filled 2017-07-24 (×5): qty 1

## 2017-07-24 MED ORDER — FENTANYL CITRATE (PF) 250 MCG/5ML IJ SOLN
INTRAMUSCULAR | Status: AC
  Filled 2017-07-24: qty 20

## 2017-07-24 MED ORDER — PROTAMINE SULFATE 10 MG/ML IV SOLN
INTRAVENOUS | Status: AC
  Filled 2017-07-24: qty 25

## 2017-07-24 MED ORDER — MAGNESIUM SULFATE 4 GM/100ML IV SOLN
4.0000 g | Freq: Once | INTRAVENOUS | Status: AC
  Administered 2017-07-24: 4 g via INTRAVENOUS
  Filled 2017-07-24: qty 100

## 2017-07-24 MED ORDER — LACTATED RINGERS IV SOLN: 500.0000 mL | Freq: Once | INTRAVENOUS | Status: DC | PRN

## 2017-07-24 MED ORDER — METOPROLOL TARTRATE 12.5 MG HALF TABLET
ORAL_TABLET | ORAL | Status: AC
  Filled 2017-07-24: qty 1

## 2017-07-24 MED ORDER — SODIUM CHLORIDE 0.9% FLUSH
3.0000 mL | Freq: Two times a day (BID) | INTRAVENOUS | Status: DC
  Administered 2017-07-25 – 2017-07-26 (×2): 3 mL via INTRAVENOUS

## 2017-07-24 MED ORDER — LORATADINE 10 MG PO TABS
10.0000 mg | ORAL_TABLET | Freq: Every day | ORAL | Status: DC
  Administered 2017-07-25 – 2017-07-29 (×5): 10 mg via ORAL
  Filled 2017-07-24 (×5): qty 1

## 2017-07-24 MED ORDER — ORAL CARE MOUTH RINSE
15.0000 mL | Freq: Two times a day (BID) | OROMUCOSAL | Status: DC
  Administered 2017-07-25: 15 mL via OROMUCOSAL

## 2017-07-24 MED ORDER — ORAL CARE MOUTH RINSE
15.0000 mL | OROMUCOSAL | Status: DC
  Administered 2017-07-24: 15 mL via OROMUCOSAL

## 2017-07-24 MED ORDER — BISACODYL 5 MG PO TBEC
10.0000 mg | DELAYED_RELEASE_TABLET | Freq: Every day | ORAL | Status: DC
  Administered 2017-07-25 – 2017-07-28 (×3): 10 mg via ORAL
  Filled 2017-07-24 (×4): qty 2

## 2017-07-24 MED ORDER — METOPROLOL TARTRATE 12.5 MG HALF TABLET: 12.5000 mg | ORAL_TABLET | Freq: Two times a day (BID) | ORAL | Status: DC

## 2017-07-24 MED ORDER — SODIUM CHLORIDE 0.9% FLUSH
10.0000 mL | Freq: Two times a day (BID) | INTRAVENOUS | Status: DC
  Administered 2017-07-24 – 2017-07-25 (×2): 10 mL
  Administered 2017-07-26: 3 mL

## 2017-07-24 MED ORDER — ACETAMINOPHEN 500 MG PO TABS
1000.0000 mg | ORAL_TABLET | Freq: Four times a day (QID) | ORAL | Status: DC
  Administered 2017-07-24 – 2017-07-29 (×17): 1000 mg via ORAL
  Filled 2017-07-24 (×17): qty 2

## 2017-07-24 MED ORDER — MIDAZOLAM HCL 5 MG/5ML IJ SOLN
INTRAMUSCULAR | Status: DC | PRN
  Administered 2017-07-24: 3 mg via INTRAVENOUS
  Administered 2017-07-24: 2 mg via INTRAVENOUS
  Administered 2017-07-24: 1 mg via INTRAVENOUS
  Administered 2017-07-24: 3 mg via INTRAVENOUS
  Administered 2017-07-24: 1 mg via INTRAVENOUS
  Administered 2017-07-24: 2 mg via INTRAVENOUS

## 2017-07-24 MED ORDER — ACETAMINOPHEN 160 MG/5ML PO SOLN: 650.0000 mg | Freq: Once | ORAL | Status: AC

## 2017-07-24 MED ORDER — CHLORHEXIDINE GLUCONATE 0.12 % MT SOLN
15.0000 mL | OROMUCOSAL | Status: AC
  Administered 2017-07-24: 15 mL via OROMUCOSAL
  Filled 2017-07-24: qty 15

## 2017-07-24 MED ORDER — SODIUM BICARBONATE 8.4 % IV SOLN: 50.0000 meq | Freq: Once | INTRAVENOUS | Status: DC

## 2017-07-24 MED ORDER — EPHEDRINE SULFATE 50 MG/ML IJ SOLN
INTRAMUSCULAR | Status: AC
  Filled 2017-07-24: qty 1

## 2017-07-24 MED ORDER — SODIUM CHLORIDE 0.9% FLUSH
3.0000 mL | INTRAVENOUS | Status: DC | PRN
  Administered 2017-07-28: 3 mL via INTRAVENOUS
  Filled 2017-07-24: qty 3

## 2017-07-24 MED ORDER — ONDANSETRON HCL 4 MG/2ML IJ SOLN
INTRAMUSCULAR | Status: AC
  Filled 2017-07-24: qty 2

## 2017-07-24 MED ORDER — ACETAMINOPHEN 160 MG/5ML PO SOLN: 1000.0000 mg | Freq: Four times a day (QID) | ORAL | Status: DC

## 2017-07-24 MED ORDER — DOCUSATE SODIUM 100 MG PO CAPS
200.0000 mg | ORAL_CAPSULE | Freq: Every day | ORAL | Status: DC
  Administered 2017-07-25 – 2017-07-28 (×3): 200 mg via ORAL
  Filled 2017-07-24 (×5): qty 2

## 2017-07-24 MED ORDER — SODIUM CHLORIDE 0.9 % IV SOLN
1.5000 g | Freq: Two times a day (BID) | INTRAVENOUS | Status: AC
  Administered 2017-07-24 – 2017-07-26 (×4): 1.5 g via INTRAVENOUS
  Filled 2017-07-24 (×4): qty 1.5

## 2017-07-24 MED ORDER — PROTAMINE SULFATE 10 MG/ML IV SOLN
INTRAVENOUS | Status: DC | PRN
  Administered 2017-07-24: 260 mg via INTRAVENOUS

## 2017-07-24 MED ORDER — SODIUM CHLORIDE 0.9 % IV SOLN: INTRAVENOUS | Status: DC

## 2017-07-24 MED ORDER — MIDAZOLAM HCL 2 MG/2ML IJ SOLN
INTRAMUSCULAR | Status: AC
  Filled 2017-07-24: qty 2

## 2017-07-24 MED ORDER — HEPARIN SODIUM (PORCINE) 1000 UNIT/ML IJ SOLN
INTRAMUSCULAR | Status: DC | PRN
  Administered 2017-07-24: 26000 [IU] via INTRAVENOUS

## 2017-07-24 MED ORDER — MORPHINE SULFATE (PF) 2 MG/ML IV SOLN
2.0000 mg | INTRAVENOUS | Status: DC | PRN
  Administered 2017-07-24 – 2017-07-25 (×2): 2 mg via INTRAVENOUS
  Filled 2017-07-24 (×2): qty 1

## 2017-07-24 MED ORDER — POTASSIUM CHLORIDE 10 MEQ/50ML IV SOLN: 10.0000 meq | INTRAVENOUS | Status: AC

## 2017-07-24 MED ORDER — PROTAMINE SULFATE 10 MG/ML IV SOLN
INTRAVENOUS | Status: AC
  Filled 2017-07-24: qty 10

## 2017-07-24 MED ORDER — ACETAMINOPHEN 650 MG RE SUPP
650.0000 mg | Freq: Once | RECTAL | Status: AC
  Administered 2017-07-24: 650 mg via RECTAL

## 2017-07-24 MED ORDER — VANCOMYCIN HCL IN DEXTROSE 1-5 GM/200ML-% IV SOLN
1000.0000 mg | Freq: Once | INTRAVENOUS | Status: AC
  Administered 2017-07-24: 1000 mg via INTRAVENOUS
  Filled 2017-07-24: qty 200

## 2017-07-24 MED ORDER — HEPARIN SODIUM (PORCINE) 1000 UNIT/ML IJ SOLN
INTRAMUSCULAR | Status: AC
  Filled 2017-07-24: qty 1

## 2017-07-24 MED ORDER — INSULIN REGULAR BOLUS VIA INFUSION
0.0000 [IU] | Freq: Three times a day (TID) | INTRAVENOUS | Status: DC
  Filled 2017-07-24: qty 10

## 2017-07-24 MED ORDER — FENTANYL CITRATE (PF) 250 MCG/5ML IJ SOLN
INTRAMUSCULAR | Status: DC | PRN
  Administered 2017-07-24: 50 ug via INTRAVENOUS
  Administered 2017-07-24: 450 ug via INTRAVENOUS
  Administered 2017-07-24: 250 ug via INTRAVENOUS
  Administered 2017-07-24: 100 ug via INTRAVENOUS

## 2017-07-24 MED ORDER — MORPHINE SULFATE (PF) 2 MG/ML IV SOLN
1.0000 mg | INTRAVENOUS | Status: DC | PRN
  Administered 2017-07-24 (×2): 1 mg via INTRAVENOUS
  Filled 2017-07-24: qty 1

## 2017-07-24 MED ORDER — PLASMA-LYTE 148 IV SOLN
INTRAVENOUS | Status: DC | PRN
  Administered 2017-07-24: 500 mL via INTRAVASCULAR

## 2017-07-24 MED ORDER — PANTOPRAZOLE SODIUM 40 MG PO TBEC
40.0000 mg | DELAYED_RELEASE_TABLET | Freq: Every day | ORAL | Status: DC
  Administered 2017-07-26 – 2017-07-29 (×4): 40 mg via ORAL
  Filled 2017-07-24 (×4): qty 1

## 2017-07-24 MED ORDER — LACTATED RINGERS IV SOLN: INTRAVENOUS | Status: DC

## 2017-07-24 MED ORDER — ROCURONIUM BROMIDE 50 MG/5ML IV SOLN
INTRAVENOUS | Status: AC
  Filled 2017-07-24: qty 1

## 2017-07-24 MED ORDER — DEXMEDETOMIDINE HCL IN NACL 200 MCG/50ML IV SOLN
INTRAVENOUS | Status: AC
  Filled 2017-07-24: qty 50

## 2017-07-24 MED ORDER — METOPROLOL TARTRATE 25 MG/10 ML ORAL SUSPENSION: 12.5000 mg | Freq: Two times a day (BID) | ORAL | Status: DC

## 2017-07-24 MED ORDER — SODIUM CHLORIDE 0.9 % IV SOLN
INTRAVENOUS | Status: DC
  Filled 2017-07-24: qty 1

## 2017-07-24 MED ORDER — CHLORHEXIDINE GLUCONATE 0.12% ORAL RINSE (MEDLINE KIT)
15.0000 mL | Freq: Two times a day (BID) | OROMUCOSAL | Status: DC
  Administered 2017-07-24: 15 mL via OROMUCOSAL

## 2017-07-24 MED ORDER — ROCURONIUM BROMIDE 50 MG/5ML IV SOLN
INTRAVENOUS | Status: AC
  Filled 2017-07-24: qty 4

## 2017-07-24 MED ORDER — NOREPINEPHRINE 4 MG/250ML-% IV SOLN
0.0000 ug/min | INTRAVENOUS | Status: DC
  Administered 2017-07-24: 5 ug/min via INTRAVENOUS
  Administered 2017-07-25: 3 ug/min via INTRAVENOUS
  Administered 2017-07-25: 7 ug/min via INTRAVENOUS
  Filled 2017-07-24 (×2): qty 250

## 2017-07-24 MED ORDER — OXYCODONE HCL 5 MG PO TABS: 5.0000 mg | ORAL_TABLET | ORAL | Status: DC | PRN

## 2017-07-24 MED ORDER — METOPROLOL TARTRATE 5 MG/5ML IV SOLN: 2.5000 mg | INTRAVENOUS | Status: DC | PRN

## 2017-07-24 MED ORDER — LACTATED RINGERS IV SOLN
INTRAVENOUS | Status: DC
  Administered 2017-07-24 – 2017-07-26 (×3): via INTRAVENOUS

## 2017-07-24 MED ORDER — DOPAMINE-DEXTROSE 3.2-5 MG/ML-% IV SOLN
0.0000 ug/kg/min | INTRAVENOUS | Status: DC
  Administered 2017-07-25: 1 ug/kg/min via INTRAVENOUS

## 2017-07-24 MED ORDER — CHLORHEXIDINE GLUCONATE 0.12 % MT SOLN: 15.0000 mL | Freq: Once | OROMUCOSAL | Status: DC

## 2017-07-24 MED ORDER — HEMOSTATIC AGENTS (NO CHARGE) OPTIME
TOPICAL | Status: DC | PRN
  Administered 2017-07-24 (×3): 1 via TOPICAL

## 2017-07-24 MED ORDER — ARTIFICIAL TEARS OPHTHALMIC OINT
TOPICAL_OINTMENT | OPHTHALMIC | Status: DC | PRN
  Administered 2017-07-24: 1 via OPHTHALMIC

## 2017-07-24 MED ORDER — MIDAZOLAM HCL 10 MG/2ML IJ SOLN
INTRAMUSCULAR | Status: AC
  Filled 2017-07-24: qty 2

## 2017-07-24 MED ORDER — 0.9 % SODIUM CHLORIDE (POUR BTL) OPTIME
TOPICAL | Status: DC | PRN
  Administered 2017-07-24: 5000 mL

## 2017-07-24 MED ORDER — CHLORHEXIDINE GLUCONATE 4 % EX LIQD: 30.0000 mL | CUTANEOUS | Status: DC

## 2017-07-24 MED ORDER — ARTIFICIAL TEARS OPHTHALMIC OINT
TOPICAL_OINTMENT | OPHTHALMIC | Status: AC
  Filled 2017-07-24: qty 3.5

## 2017-07-24 SURGICAL SUPPLY — 130 items
ADAPTER CARDIO PERF ANTE/RETRO (ADAPTER) ×3 IMPLANT
ARTICLIP LAA PROCLIP II 50 (Clip) ×3 IMPLANT
BAG DECANTER FOR FLEXI CONT (MISCELLANEOUS) ×3 IMPLANT
BANDAGE ACE 4X5 VEL STRL LF (GAUZE/BANDAGES/DRESSINGS) ×3 IMPLANT
BANDAGE ACE 6X5 VEL STRL LF (GAUZE/BANDAGES/DRESSINGS) ×3 IMPLANT
BASKET HEART (ORDER IN 25'S) (MISCELLANEOUS) ×1
BASKET HEART (ORDER IN 25S) (MISCELLANEOUS) ×2 IMPLANT
BLADE STERNUM SYSTEM 6 (BLADE) ×3 IMPLANT
BLADE SURG 11 STRL SS (BLADE) ×3 IMPLANT
BLADE SURG 15 STRL LF DISP TIS (BLADE) ×2 IMPLANT
BLADE SURG 15 STRL SS (BLADE) ×1
BNDG GAUZE ELAST 4 BULKY (GAUZE/BANDAGES/DRESSINGS) ×3 IMPLANT
CANISTER SUCT 3000ML PPV (MISCELLANEOUS) ×3 IMPLANT
CANNULA GUNDRY RCSP 15FR (MISCELLANEOUS) ×3 IMPLANT
CANNULA SUMP PERICARDIAL (CANNULA) ×3 IMPLANT
CATH HEART VENT LEFT (CATHETERS) ×4 IMPLANT
CATH ROBINSON RED A/P 18FR (CATHETERS) ×9 IMPLANT
CATH THORACIC 28FR (CATHETERS) ×3 IMPLANT
CATH THORACIC 36FR (CATHETERS) ×3 IMPLANT
CATH THORACIC 36FR RT ANG (CATHETERS) ×3 IMPLANT
CLIP VESOCCLUDE MED 24/CT (CLIP) IMPLANT
CLIP VESOCCLUDE SM WIDE 24/CT (CLIP) ×3 IMPLANT
CONN 1/2X1/2X1/2  BEN (MISCELLANEOUS)
CONN 1/2X1/2X1/2 BEN (MISCELLANEOUS) IMPLANT
CONN 3/8X1/2 ST GISH (MISCELLANEOUS) IMPLANT
CONT SPEC 4OZ CLIKSEAL STRL BL (MISCELLANEOUS) ×3 IMPLANT
COVER MAYO STAND STRL (DRAPES) ×3 IMPLANT
COVER SURGICAL LIGHT HANDLE (MISCELLANEOUS) IMPLANT
CRADLE DONUT ADULT HEAD (MISCELLANEOUS) ×3 IMPLANT
DERMABOND ADVANCED (GAUZE/BANDAGES/DRESSINGS) ×1
DERMABOND ADVANCED .7 DNX12 (GAUZE/BANDAGES/DRESSINGS) ×2 IMPLANT
DEVICE ATRICLIP LAA PRCLPII 50 (Clip) ×2 IMPLANT
DRAPE CARDIOVASCULAR INCISE (DRAPES) ×1
DRAPE SLUSH MACHINE 52X66 (DRAPES) ×3 IMPLANT
DRAPE SLUSH/WARMER DISC (DRAPES) IMPLANT
DRAPE SRG 135X102X78XABS (DRAPES) ×2 IMPLANT
DRSG COVADERM 4X14 (GAUZE/BANDAGES/DRESSINGS) ×3 IMPLANT
ELECT CAUTERY BLADE 6.4 (BLADE) ×3 IMPLANT
ELECT REM PT RETURN 9FT ADLT (ELECTROSURGICAL) ×6
ELECTRODE REM PT RTRN 9FT ADLT (ELECTROSURGICAL) ×4 IMPLANT
FELT TEFLON 1X6 (MISCELLANEOUS) ×3 IMPLANT
FLOSEAL 5ML (HEMOSTASIS) ×3 IMPLANT
GAUZE SPONGE 4X4 12PLY STRL (GAUZE/BANDAGES/DRESSINGS) ×6 IMPLANT
GAUZE SPONGE 4X4 12PLY STRL LF (GAUZE/BANDAGES/DRESSINGS) ×9 IMPLANT
GLOVE BIO SURGEON STRL SZ 6 (GLOVE) ×6 IMPLANT
GLOVE BIO SURGEON STRL SZ 6.5 (GLOVE) ×15 IMPLANT
GLOVE BIO SURGEON STRL SZ7 (GLOVE) IMPLANT
GLOVE BIO SURGEON STRL SZ7.5 (GLOVE) IMPLANT
GLOVE BIOGEL PI IND STRL 6 (GLOVE) ×2 IMPLANT
GLOVE BIOGEL PI IND STRL 6.5 (GLOVE) ×10 IMPLANT
GLOVE BIOGEL PI IND STRL 7.0 (GLOVE) IMPLANT
GLOVE BIOGEL PI INDICATOR 6 (GLOVE) ×1
GLOVE BIOGEL PI INDICATOR 6.5 (GLOVE) ×5
GLOVE BIOGEL PI INDICATOR 7.0 (GLOVE)
GLOVE EUDERMIC 7 POWDERFREE (GLOVE) ×6 IMPLANT
GLOVE ORTHO TXT STRL SZ7.5 (GLOVE) IMPLANT
GOWN STRL REUS W/ TWL LRG LVL3 (GOWN DISPOSABLE) ×16 IMPLANT
GOWN STRL REUS W/ TWL XL LVL3 (GOWN DISPOSABLE) ×2 IMPLANT
GOWN STRL REUS W/TWL LRG LVL3 (GOWN DISPOSABLE) ×8
GOWN STRL REUS W/TWL XL LVL3 (GOWN DISPOSABLE) ×1
HEMOSTAT POWDER SURGIFOAM 1G (HEMOSTASIS) IMPLANT
HEMOSTAT SURGICEL 2X14 (HEMOSTASIS) ×3 IMPLANT
INSERT FOGARTY 61MM (MISCELLANEOUS) IMPLANT
INSERT FOGARTY XLG (MISCELLANEOUS) IMPLANT
KIT BASIN OR (CUSTOM PROCEDURE TRAY) ×3 IMPLANT
KIT CATH CPB BARTLE (MISCELLANEOUS) ×3 IMPLANT
KIT SUCTION CATH 14FR (SUCTIONS) ×3 IMPLANT
KIT TURNOVER KIT B (KITS) ×3 IMPLANT
KIT VASOVIEW HEMOPRO VH 3000 (KITS) ×3 IMPLANT
LINE VENT (MISCELLANEOUS) ×3 IMPLANT
LOOP VESSEL SUPERMAXI WHITE (MISCELLANEOUS) ×6 IMPLANT
NS IRRIG 1000ML POUR BTL (IV SOLUTION) ×18 IMPLANT
PACK E OPEN HEART (SUTURE) ×3 IMPLANT
PACK OPEN HEART (CUSTOM PROCEDURE TRAY) ×3 IMPLANT
PAD ARMBOARD 7.5X6 YLW CONV (MISCELLANEOUS) ×6 IMPLANT
PAD ELECT DEFIB RADIOL ZOLL (MISCELLANEOUS) ×3 IMPLANT
PENCIL BUTTON HOLSTER BLD 10FT (ELECTRODE) ×3 IMPLANT
PUNCH AORTIC ROTATE 4.0MM (MISCELLANEOUS) IMPLANT
PUNCH AORTIC ROTATE 4.5MM 8IN (MISCELLANEOUS) ×3 IMPLANT
PUNCH AORTIC ROTATE 5MM 8IN (MISCELLANEOUS) IMPLANT
SET CARDIOPLEGIA MPS 5001102 (MISCELLANEOUS) ×3 IMPLANT
SOLUTION ANTI FOG 6CC (MISCELLANEOUS) ×3 IMPLANT
SPONGE INTESTINAL PEANUT (DISPOSABLE) IMPLANT
SPONGE LAP 18X18 X RAY DECT (DISPOSABLE) ×3 IMPLANT
SPONGE LAP 4X18 RFD (DISPOSABLE) ×3 IMPLANT
SUCKER WEIGHTED FLEX (MISCELLANEOUS) ×3 IMPLANT
SUT BONE WAX W31G (SUTURE) ×3 IMPLANT
SUT ETHIBON 2 0 V 52N 30 (SUTURE) ×6 IMPLANT
SUT ETHIBOND 2 0 SH (SUTURE) ×1 IMPLANT
SUT ETHIBOND 2 0 SH 36X2 (SUTURE) ×2 IMPLANT
SUT ETHIBOND 2 0 V4 (SUTURE) IMPLANT
SUT ETHIBOND 2 0V4 GREEN (SUTURE) IMPLANT
SUT MNCRL AB 4-0 PS2 18 (SUTURE) ×3 IMPLANT
SUT PROLENE 3 0 SH 48 (SUTURE) IMPLANT
SUT PROLENE 3 0 SH DA (SUTURE) IMPLANT
SUT PROLENE 3 0 SH1 36 (SUTURE) ×3 IMPLANT
SUT PROLENE 4 0 RB 1 (SUTURE) ×3
SUT PROLENE 4 0 SH DA (SUTURE) IMPLANT
SUT PROLENE 4-0 RB1 .5 CRCL 36 (SUTURE) ×6 IMPLANT
SUT PROLENE 5 0 C 1 36 (SUTURE) IMPLANT
SUT PROLENE 6 0 C 1 30 (SUTURE) IMPLANT
SUT PROLENE 7 0 BV 1 (SUTURE) IMPLANT
SUT PROLENE 7 0 BV1 MDA (SUTURE) ×3 IMPLANT
SUT PROLENE 8 0 BV175 6 (SUTURE) IMPLANT
SUT SILK  1 MH (SUTURE)
SUT SILK 1 MH (SUTURE) IMPLANT
SUT SILK 2 0 SH CR/8 (SUTURE) ×3 IMPLANT
SUT STEEL 6MS V (SUTURE) IMPLANT
SUT STEEL STERNAL CCS#1 18IN (SUTURE) IMPLANT
SUT STEEL SZ 6 DBL 3X14 BALL (SUTURE) IMPLANT
SUT VIC AB 1 CTX 36 (SUTURE) ×2
SUT VIC AB 1 CTX36XBRD ANBCTR (SUTURE) ×4 IMPLANT
SUT VIC AB 2-0 CT1 27 (SUTURE) ×1
SUT VIC AB 2-0 CT1 TAPERPNT 27 (SUTURE) ×2 IMPLANT
SUT VIC AB 2-0 CTX 27 (SUTURE) IMPLANT
SUT VIC AB 3-0 SH 27 (SUTURE)
SUT VIC AB 3-0 SH 27X BRD (SUTURE) IMPLANT
SUT VIC AB 3-0 X1 27 (SUTURE) IMPLANT
SUT VICRYL 4-0 PS2 18IN ABS (SUTURE) IMPLANT
SYSTEM SAHARA CHEST DRAIN ATS (WOUND CARE) ×3 IMPLANT
TAPE CLOTH SURG 4X10 WHT LF (GAUZE/BANDAGES/DRESSINGS) ×6 IMPLANT
TAPE PAPER 2X10 WHT MICROPORE (GAUZE/BANDAGES/DRESSINGS) ×3 IMPLANT
TOWEL GREEN STERILE (TOWEL DISPOSABLE) ×3 IMPLANT
TOWEL GREEN STERILE FF (TOWEL DISPOSABLE) ×3 IMPLANT
TRAY FOLEY SLVR 16FR TEMP STAT (SET/KITS/TRAYS/PACK) ×3 IMPLANT
TUBING INSUFFLATION (TUBING) ×3 IMPLANT
UNDERPAD 30X30 (UNDERPADS AND DIAPERS) ×3 IMPLANT
VALVE AORTIC SZ23 INSP/RESIL (Prosthesis & Implant Heart) ×3 IMPLANT
VENT LEFT HEART 12002 (CATHETERS) ×6
WATER STERILE IRR 1000ML POUR (IV SOLUTION) ×6 IMPLANT

## 2017-07-24 NOTE — Anesthesia Procedure Notes (Signed)
Procedure Name: Intubation Date/Time: 07/24/2017 7:45 AM Performed by: Inda Coke, CRNA Pre-anesthesia Checklist: Patient identified, Emergency Drugs available, Suction available and Patient being monitored Patient Re-evaluated:Patient Re-evaluated prior to induction Oxygen Delivery Method: Circle System Utilized Preoxygenation: Pre-oxygenation with 100% oxygen Induction Type: IV induction Ventilation: Mask ventilation without difficulty and Oral airway inserted - appropriate to patient size Laryngoscope Size: Mac and 4 Grade View: Grade I Tube type: Oral Number of attempts: 1 Airway Equipment and Method: Stylet and Oral airway Placement Confirmation: ETT inserted through vocal cords under direct vision,  positive ETCO2 and breath sounds checked- equal and bilateral Secured at: 22 cm Tube secured with: Tape Dental Injury: Teeth and Oropharynx as per pre-operative assessment

## 2017-07-24 NOTE — Brief Op Note (Signed)
07/24/2017  3:04 PM  PATIENT:  [name]  77 y.o. male  PRE-OPERATIVE DIAGNOSIS:  CAD AS   POST-OPERATIVE DIAGNOSIS:  CAD AS  PROCEDURE:  Procedure(s):  CORONARY ARTERY BYPASS GRAFTING x 3 -LIMA to LAD -SVG to PDA off LEFT CIRCUMFLEX -SVG to RCA   ENDOSCOPIC HARVEST GREATER SAPHENOUS VEIN -Left leg to below the knee -Right leg opened, vein felt to be small  AORTIC VALVE REPLACEMENT  -23 mm Edwards Inspiris Resilia Bioprosthetic valve  CLIPPING OF ATRIAL APPENDAGE  -50 mm Atricure Pro 2 Clip  TRANSESOPHAGEAL ECHOCARDIOGRAM (TEE) (N/A)  SURGEON:  Surgeon(s) and Role:    * Bartle, Fernande Boyden, MD - Primary  PHYSICIAN ASSISTANT: Concettina Leth PA-C  ANESTHESIA:   general  EBL:  800 mL   BLOOD ADMINISTERED: CELLSAVER  DRAINS: Left Pleural Chest Tubes, Mediastinal Chest Drains   LOCAL MEDICATIONS USED:  NONE  SPECIMEN:  Source of Specimen:  Aortic Valve Leaflets  DISPOSITION OF SPECIMEN:  PATHOLOGY  COUNTS:  YES  TOURNIQUET:  * No tourniquets in log *  DICTATION: .Dragon Dictation  PLAN OF CARE: Admit to inpatient   PATIENT DISPOSITION:  ICU - intubated and hemodynamically stable.   Delay start of Pharmacological VTE agent (>24hrs) due to surgical blood loss or risk of bleeding: yes

## 2017-07-24 NOTE — Progress Notes (Signed)
  Echocardiogram Echocardiogram Transesophageal has been performed.  [name] 07/24/2017, 9:05 AM

## 2017-07-24 NOTE — Interval H&P Note (Signed)
History and Physical Interval Note:  07/24/2017 7:23 AM  [name]  has presented today for surgery, with the diagnosis of CAD AS MR  The various methods of treatment have been discussed with the patient and family. After consideration of risks, benefits and other options for treatment, the patient has consented to  Procedure(s): CORONARY ARTERY BYPASS GRAFTING (CABG) (N/A) AORTIC VALVE REPLACEMENT (AVR) (N/A) MITRAL VALVE REPAIR (MVR) (N/A) CLIPPING OF ATRIAL APPENDAGE (N/A) TRANSESOPHAGEAL ECHOCARDIOGRAM (TEE) (N/A) as a surgical intervention .  The patient's history has been reviewed, patient examined, no change in status, stable for surgery.  I have reviewed the patient's chart and labs.  Questions were answered to the patient's satisfaction.     Gaye Pollack

## 2017-07-24 NOTE — Op Note (Signed)
CARDIOVASCULAR SURGERY OPERATIVE NOTE  07/24/2017  Surgeon:  Gaye Pollack, MD  First Assistant: Ellwood Handler,  PA-C   Preoperative Diagnosis:  1. Severe multi-vessel coronary artery disease                                                                                             2.  Severe aortic stenosis                                               3.  Mild functional mitral regurgitation  Postoperative Diagnosis:  Same   Procedure:  1. Median Sternotomy 2. Extracorporeal circulation 3.   Coronary artery bypass grafting x 3   Left internal mammary graft to the LAD  SVG to distal LCX  SVG to RCA  4.   Endoscopic vein harvest from the left leg 5.   Aortic valve replacement using a 23 mm Edwards INSPIRIS RESILIA pericardial valve 6.   Clipping of left atrial appendage   Anesthesia:  General Endotracheal   Clinical History/Surgical Indication:  [name] a 77 y.o.malewith symptoms of severe, stage D2aortic stenosis with NYHA Class IVsymptoms. He is a very active gentleman who had been exercising daily until recently.  He has a 2 month history of decreased stamina and exertional shortness of breath that progressed over the past several weeks.  His echo showed a trileaflet aortic valve that was calcifed with reduced mobility and a mean gradient of 25 mm Hg, reduced EF of 35-40% and moderate MR. Cath shows severe 3 vessel CAD. His mean gradient across the aortic valve increased to 45 mm Hg on 10 mcg of dobutamine consistent with low gradient, low EF severe AS. TEE showed a severely calcified AV with reduced mobility with a peak velocity of 3.65 m/sec and an AVA of 0.6 cm2. There is moderate secondary MR. LVEF is 30-35%. I think the best treatment for him is CABG/AVR/MV repair with clipping of the LAA. His coronary disease is best treated with CABG, not PCI.I discussed the  operative procedure with the patient andhis wifeincluding alternatives, benefits and risks; including but not limited to bleeding, blood transfusion, infection, stroke, myocardial infarction, graft failure, heart block requiring a permanent pacemaker, organ dysfunction, and death. Kaicen Knightunderstands and agrees to proceed.     Preparation:  The patient was seen in the preoperative holding area and the correct patient, correct operation were confirmed with the patient after reviewing the medical record and catheterization. The consent was signed by me. Preoperative antibiotics were given. A pulmonary arterial line and radial arterial line were placed by the anesthesia team. The patient was taken back to the operating room and positioned supine on the operating room table. After being placed under general endotracheal anesthesia by the anesthesia team a foley catheter was placed. The neck, chest, abdomen, and both legs were prepped with betadine soap and solution and draped in the usual sterile manner. A surgical time-out was taken and the correct patient and operative procedure  were confirmed with the nursing and anesthesia staff.  Preop TEE: performed by Dr. Suann Larry and reviewed by me. LVEF 30-35% with moderate concentric LVH and moderately LV dilatation. Structurally normal mitral valve with mild central MR. ERO is 0.1 cm2 and  PISA radium is 5 mm, both consistent with mild MR.   Since he had visually mild MR with normal PA pressures and measurements consistent with mild MR with a structurally normal mitral valve and an otherwise complex and lengthy AVR/CABG I felt that it would be best to leave the mitral valve alone.  Cardiopulmonary Bypass:  A median sternotomy was performed. The pericardium was opened in the midline. Right ventricular function appeared normal. The ascending aorta was of normal size and had no palpable plaque. There were no contraindications to aortic  cannulation or cross-clamping. The patient was fully systemically heparinized and the ACT was maintained > 400 sec. The proximal aortic arch was cannulated with a 20 F aortic cannula for arterial inflow. Venous cannulation was performed via the right atrial appendage using a two-staged venous cannula. An antegrade cardioplegia/vent cannula was inserted into the mid-ascending aorta. A left ventricular vent was placed through the right superior pulmonary vein and a retrograde cardioplegia cannula placed through the right atrium into the coronary sinus. Aortic occlusion was performed with a single cross-clamp. Systemic cooling to 32 degrees Centigrade and topical cooling of the heart with iced saline were used. Hyperkalemic antegrade cold blood cardioplegia was used to induce diastolic arrest and was then given at about 20 minute intervals throughout the period of arrest to maintain myocardial temperature at or below 10 degrees centigrade. Cold blood retrograde cardioplegia was given during the valve replacement. A temperature probe was inserted into the interventricular septum and an insulating pad was placed in the pericardium.   Left internal mammary harvest:  The left side of the sternum was retracted using the Rultract retractor. The left internal mammary artery was harvested as a pedicle graft. All side branches were clipped. It was a medium-sized vessel of good quality with excellent blood flow. It was ligated distally and divided. It was sprayed with topical papaverine solution to prevent vasospasm.   Endoscopic vein harvest:  We initially examined the right greater saphenous vein at the knee and it was small and not harvested. The left greater saphenous vein was harvested endoscopically through a 2 cm incision medial to the left knee. It was harvested from the upper thigh to below the knee. It was a medium-sized vein of good quality. The side branches were all ligated with 4-0 silk ties.     Coronary arteries:  The coronary arteries were examined.   LAD:  Large vessel that was diffusely diseased throughout the proximal and mid portions.  LCX:  Large distal LCX branch with no distal disease in it. There was a small occluded OM that was diffusely diseased and not graft-able.  RCA:  Non-dominant but moderate caliber    Grafts:  1. LIMA to the LAD: 1.75 mm distally. It was sewn end to side using 8-0 prolene continuous suture. 2. SVG to distal LCX:  1.75 mm. It was sewn end to side using 7-0 prolene continuous suture. 3. SVG to mid RCA:  1.75 mm. It was sewn end to side using 7-0 prolene continuous suture.   The proximal vein graft anastomoses were performed to the mid-ascending aorta using continuous 6-0 prolene suture. Graft markers were placed around the proximal anastomoses.   Clipping of left atrial appendage:  Ligation  of left atrial appendage:   The base of the appendage was measured and a 50 mm Atricure Atriclip was chosen. This was placed across the base of the LAA without difficulty.     Aortic Valve Replacement:  A transverse aortotomy was performed 1 cm above the take-off of the right coronary artery. The native valve was tricuspid with calcified leaflets and severe annular calcification. The ostia of the coronary arteries were in normal position and were not obstructed. The native valve leaflets were excised and the annulus was decalcified with rongeurs. Care was taken to remove all particulate debris. The left ventricle was directly inspected for debris and then irrigated with ice saline solution. The annulus was sized and a size 23 mm Edwards INSPIRIS RESILIA  pericardial valve was chosen. The model number was [id number] and the serial number was [id number].  While the valve was being prepared 2-0 Ethibond pledgeted horizontal mattress sutures were placed around the annulus with the pledgets in a sub-annular position. The sutures were placed through the sewing  ring and the valve lowered into place. The sutures were tied sequentially. The valve seated nicely and the coronary ostia were not obstructed. The prosthetic valve leaflets moved normally and there was no sub-valvular obstruction. The aortotomy was closed using 4-0 Prolene suture in 2 layers with felt strips to reinforce the closure.  Completion:  The patient was rewarmed to 37 degrees Centigrade. The clamp was removed from the LIMA pedicle and there was rapid warming of the septum and return of ventricular fibrillation. The crossclamp was removed with a time of 150 minutes. There was spontaneous return of sinus rhythm. The distal and proximal anastomoses were checked for hemostasis. The position of the grafts was satisfactory. Two temporary epicardial pacing wires were placed on the right atrium and two on the right ventricle. The patient was weaned from CPB without difficulty on dopamine 5 mcg. CPB time was 180 minutes. Cardiac output was 5 LPM. TEE showed a normally functioning aortic valve prosthesis with no paravalvular leak. The mean gradient was 10 mm Hg. There was still mild central MR. LV systolic function was slightly improved on dopamine. Heparin was fully reversed with protamine and the aortic and venous cannulas removed. Hemostasis was achieved. Mediastinal and left pleural drainage tubes were placed. The sternum was closed with  #6 stainless steel wires. The fascia was closed with continuous # 1 vicryl suture. The subcutaneous tissue was closed with 2-0 vicryl continuous suture. The skin was closed with 3-0 vicryl subcuticular suture. All sponge, needle, and instrument counts were reported correct at the end of the case. Dry sterile dressings were placed over the incisions and around the chest tubes which were connected to pleurevac suction. The patient was then transported to the surgical intensive care unit in critical but stable condition.

## 2017-07-24 NOTE — Plan of Care (Signed)
  Problem: Clinical Measurements: Goal: Respiratory complications will improve Outcome: Progressing Note:  Patient extubated to 4L [place] and tolerating well.  Goal: Cardiovascular complication will be avoided Outcome: Progressing   Problem: Activity: Goal: Risk for activity intolerance will decrease Outcome: Progressing   Problem: Elimination: Goal: Will not experience complications related to urinary retention Outcome: Progressing   Problem: Pain Managment: Goal: General experience of comfort will improve Outcome: Progressing   Problem: Cardiac: Goal: Hemodynamic stability will improve Outcome: Progressing   Problem: Respiratory: Goal: Respiratory status will improve Outcome: Progressing

## 2017-07-24 NOTE — Transfer of Care (Signed)
Immediate Anesthesia Transfer of Care Note  Patient: [name]  Procedure(s) Performed: CORONARY ARTERY BYPASS GRAFTING (CABG) times 3 using the left greater saphenous vein harvested endoscopically and left internal mammary artery. (N/A Chest) AORTIC VALVE REPLACEMENT (AVR) using 18mm Inspiris Aortic Valve (N/A Chest) CLIPPING OF ATRIAL APPENDAGE using a 68mm Atricure clip (N/A Chest) TRANSESOPHAGEAL ECHOCARDIOGRAM (TEE) (N/A )  Patient Location: SICU  Anesthesia Type:General  Level of Consciousness: Patient remains intubated per anesthesia plan  Airway & Oxygen Therapy: Patient remains intubated per anesthesia plan and Patient placed on Ventilator (see vital sign flow sheet for setting)  Post-op Assessment: Report given to RN and Post -op Vital signs reviewed and stable  Post vital signs: Reviewed and stable  Last Vitals:  Vitals Value Taken Time  BP    Temp 36.5 C 07/24/2017  2:24 PM  Pulse 92 07/24/2017  2:24 PM  Resp 12 07/24/2017  2:24 PM  SpO2 99 % 07/24/2017  2:24 PM  Vitals shown include unvalidated device data.  Last Pain:  Vitals:   07/24/17 0628  TempSrc:   PainSc: 0-No pain         Complications: No apparent anesthesia complications

## 2017-07-24 NOTE — Progress Notes (Signed)
Patient ID: Micholas Drumwright, male   DOB: 1940/03/04, 77 y.o.   MRN: [id number]  TCTS Evening Rounds:   Hemodynamically stable  CI = 2.75 on dop 5, neo 100  Awake on vent, neuro intact. Failed weaning the first time but starting again since more alert.  Urine output good  CT output low  CBC    Component Value Date/Time   WBC 13.9 (H) 07/24/2017 1411   RBC 3.87 (L) 07/24/2017 1411   HGB 11.9 (L) 07/24/2017 1411   HCT 36.3 (L) 07/24/2017 1411   PLT 200 07/24/2017 1411   MCV 93.8 07/24/2017 1411   MCH 30.7 07/24/2017 1411   MCHC 32.8 07/24/2017 1411   RDW 14.5 07/24/2017 1411   LYMPHSABS 0.4 (L) 07/09/2017 1130   MONOABS 0.5 07/09/2017 1130   EOSABS 0.1 07/09/2017 1130   BASOSABS 0.0 07/09/2017 1130     BMET    Component Value Date/Time   NA 135 07/24/2017 1322   K 4.1 07/24/2017 1322   CL 99 (L) 07/24/2017 1322   CO2 23 07/20/2017 0932   GLUCOSE 130 (H) 07/24/2017 1322   BUN 19 07/24/2017 1322   CREATININE 0.70 07/24/2017 1322   CALCIUM 8.9 07/20/2017 0932   GFRNONAA >60 07/20/2017 0932   GFRAA >60 07/20/2017 0932     A/P:  Stable postop course. Continue current plans. Will add levophed for BP support since neo has not been effective.

## 2017-07-24 NOTE — Progress Notes (Signed)
RT attempted NIF/VC with patient but patient was unable to perform well or meet extubation criteria. Patient placed back on original settings. Will reattempt weaning in one hour. Vitals are stable. RT will continue to monitor.

## 2017-07-24 NOTE — Anesthesia Procedure Notes (Signed)
Arterial Line Insertion Start/End6/17/2019 7:00 AM, 07/24/2017 7:02 AM Performed by: Inda Coke, CRNA, CRNA  Patient location: Pre-op. Preanesthetic checklist: patient identified, IV checked, site marked, risks and benefits discussed, surgical consent, monitors and equipment checked, pre-op evaluation, timeout performed and anesthesia consent Lidocaine 1% used for infiltration and patient sedated Left, radial was placed Catheter size: 20 G Hand hygiene performed  and maximum sterile barriers used  Allen's test indicative of satisfactory collateral circulation Attempts: 1 Procedure performed without using ultrasound guided technique. Ultrasound Notes:anatomy identified Following insertion, dressing applied and Biopatch. Post procedure assessment: normal  Patient tolerated the procedure well with no immediate complications.

## 2017-07-24 NOTE — Progress Notes (Signed)
Rapid weaning protocol 

## 2017-07-24 NOTE — Procedures (Signed)
Extubation Procedure Note  Patient Details:   Name: Masson Nalepa DOB: [birth year] MRN: [id number]   Airway Documentation:    Vent end date: 07/24/17 Vent end time: 1835   Evaluation  O2 sats: stable throughout Complications: No apparent complications Patient did tolerate procedure well. Bilateral Breath Sounds: Clear   Yes   Patient extubated per protocol to 4L [place] with no apparent complications. Positive cuff leak was noted prior to extubation. Patient achieved NIF of -30 and VC of .75 liters. Patient is alert and oriented to place and is able to speak. Vitals are stable. RT will continue to monitor.   Naif Alabi Clyda Greener 07/24/2017, 6:43 PM

## 2017-07-24 NOTE — Anesthesia Procedure Notes (Signed)
Central Venous Catheter Insertion Performed by: Roderic Palau, MD, anesthesiologist Start/End6/17/2019 6:40 AM, 07/24/2017 6:55 AM Patient location: Pre-op. Preanesthetic checklist: patient identified, IV checked, site marked, risks and benefits discussed, surgical consent, monitors and equipment checked, pre-op evaluation, timeout performed and anesthesia consent Hand hygiene performed  and maximum sterile barriers used  PA cath was placed.Swan type:thermodilution PA Cath depth:50 Procedure performed without using ultrasound guided technique. Attempts: 1 Patient tolerated the procedure well with no immediate complications.

## 2017-07-24 NOTE — Anesthesia Postprocedure Evaluation (Signed)
Anesthesia Post Note  Patient: [name]  Procedure(s) Performed: CORONARY ARTERY BYPASS GRAFTING (CABG) times 3 using the left greater saphenous vein harvested endoscopically and left internal mammary artery. (N/A Chest) AORTIC VALVE REPLACEMENT (AVR) using 46mm Inspiris Aortic Valve (N/A Chest) CLIPPING OF ATRIAL APPENDAGE using a 99mm Atricure clip (N/A Chest) TRANSESOPHAGEAL ECHOCARDIOGRAM (TEE) (N/A )     Patient location during evaluation: SICU Anesthesia Type: General Level of consciousness: sedated Pain management: pain level controlled Vital Signs Assessment: post-procedure vital signs reviewed and stable Respiratory status: patient remains intubated per anesthesia plan Cardiovascular status: stable Postop Assessment: no apparent nausea or vomiting Anesthetic complications: no    Last Vitals:  Vitals:   07/24/17 1410 07/24/17 1518  BP: (!) 92/51 105/60  Pulse: 95 95  Resp: 12 12  Temp:  (!) 36.3 C  SpO2: 97% 100%    Last Pain:  Vitals:   07/24/17 0628  TempSrc:   PainSc: 0-No pain                 Hadas Jessop,W. EDMOND

## 2017-07-24 NOTE — Progress Notes (Signed)
This note also relates to the following rows which could not be included: SpO2 - Cannot attach notes to unvalidated device data  Rapid weaning protocol  

## 2017-07-24 NOTE — Anesthesia Procedure Notes (Signed)
Central Venous Catheter Insertion Performed by: Roderic Palau, MD, anesthesiologist Start/End6/17/2019 6:40 AM, 07/24/2017 6:55 AM Patient location: Pre-op. Preanesthetic checklist: patient identified, IV checked, site marked, risks and benefits discussed, surgical consent, monitors and equipment checked, pre-op evaluation, timeout performed and anesthesia consent Position: Trendelenburg Lidocaine 1% used for infiltration and patient sedated Hand hygiene performed , maximum sterile barriers used  and Seldinger technique used Catheter size: 9 Fr Total catheter length 10. Central line was placed.MAC introducer Procedure performed using ultrasound guided technique. Ultrasound Notes:anatomy identified, needle tip was noted to be adjacent to the nerve/plexus identified, no ultrasound evidence of intravascular and/or intraneural injection and image(s) printed for medical record Attempts: 1 Following insertion, line sutured, dressing applied and Biopatch. Post procedure assessment: blood return through all ports, free fluid flow and no air  Patient tolerated the procedure well with no immediate complications.

## 2017-07-25 ENCOUNTER — Inpatient Hospital Stay (HOSPITAL_COMMUNITY): Payer: Medicare Other

## 2017-07-25 ENCOUNTER — Other Ambulatory Visit: Payer: Self-pay

## 2017-07-25 ENCOUNTER — Encounter (HOSPITAL_COMMUNITY): Payer: Self-pay | Admitting: Surgery

## 2017-07-25 DIAGNOSIS — Z9889 Other specified postprocedural states: Secondary | ICD-10-CM

## 2017-07-25 DIAGNOSIS — Z952 Presence of prosthetic heart valve: Secondary | ICD-10-CM

## 2017-07-25 LAB — POCT I-STAT, CHEM 8
BUN: 21 mg/dL — ABNORMAL HIGH (ref 6–20)
Calcium, Ion: 1.26 mmol/L (ref 1.15–1.40)
Chloride: 97 mmol/L — ABNORMAL LOW (ref 101–111)
Creatinine, Ser: 0.8 mg/dL (ref 0.61–1.24)
Glucose, Bld: 107 mg/dL — ABNORMAL HIGH (ref 65–99)
HCT: 28 % — ABNORMAL LOW (ref 39.0–52.0)
Hemoglobin: 9.5 g/dL — ABNORMAL LOW (ref 13.0–17.0)
Potassium: 4.7 mmol/L (ref 3.5–5.1)
Sodium: 133 mmol/L — ABNORMAL LOW (ref 135–145)
TCO2: 25 mmol/L (ref 22–32)

## 2017-07-25 LAB — GLUCOSE, CAPILLARY
Glucose-Capillary: 120 mg/dL — ABNORMAL HIGH (ref 65–99)
Glucose-Capillary: 145 mg/dL — ABNORMAL HIGH (ref 65–99)
Glucose-Capillary: 168 mg/dL — ABNORMAL HIGH (ref 65–99)
Glucose-Capillary: 110 mg/dL — ABNORMAL HIGH (ref 65–99)
Glucose-Capillary: 177 mg/dL — ABNORMAL HIGH (ref 65–99)
Glucose-Capillary: 134 mg/dL — ABNORMAL HIGH (ref 65–99)

## 2017-07-25 LAB — BASIC METABOLIC PANEL
Anion gap: 7 (ref 5–15)
BUN: 17 mg/dL (ref 6–20)
CO2: 25 mmol/L (ref 22–32)
Calcium: 8.2 mg/dL — ABNORMAL LOW (ref 8.9–10.3)
Chloride: 102 mmol/L (ref 101–111)
Creatinine, Ser: 0.92 mg/dL (ref 0.61–1.24)
GFR calc Af Amer: >60 mL/min (ref >60)
GFR calc non Af Amer: >60 mL/min (ref >60)
Glucose, Bld: 125 mg/dL — ABNORMAL HIGH (ref 65–99)
Potassium: 4.5 mmol/L (ref 3.5–5.1)
Sodium: 134 mmol/L — ABNORMAL LOW (ref 135–145)

## 2017-07-25 LAB — CBC
HCT: 30.8 % — ABNORMAL LOW (ref 39.0–52.0)
HCT: 29.8 % — ABNORMAL LOW (ref 39.0–52.0)
Hemoglobin: 10.1 g/dL — ABNORMAL LOW (ref 13.0–17.0)
Hemoglobin: 9.5 g/dL — ABNORMAL LOW (ref 13.0–17.0)
MCH: 30.8 pg (ref 26.0–34.0)
MCH: 30.6 pg (ref 26.0–34.0)
MCHC: 32.8 g/dL (ref 30.0–36.0)
MCHC: 31.9 g/dL (ref 30.0–36.0)
MCV: 93.9 fL (ref 78.0–100.0)
MCV: 96.1 fL (ref 78.0–100.0)
Platelets: 158 10*3/uL (ref 150–400)
Platelets: 146 10*3/uL — ABNORMAL LOW (ref 150–400)
RBC: 3.28 MIL/uL — ABNORMAL LOW (ref 4.22–5.81)
RBC: 3.1 MIL/uL — ABNORMAL LOW (ref 4.22–5.81)
RDW: 14.2 % (ref 11.5–15.5)
RDW: 14.6 % (ref 11.5–15.5)
WBC: 8.4 10*3/uL (ref 4.0–10.5)
WBC: 8.7 10*3/uL (ref 4.0–10.5)

## 2017-07-25 LAB — POCT I-STAT 4, (NA,K, GLUC, HGB,HCT)
Glucose, Bld: 107 mg/dL — ABNORMAL HIGH (ref 65–99)
HCT: 35 % — ABNORMAL LOW (ref 39.0–52.0)
Hemoglobin: 11.9 g/dL — ABNORMAL LOW (ref 13.0–17.0)
Potassium: 4.2 mmol/L (ref 3.5–5.1)
Sodium: 135 mmol/L (ref 135–145)

## 2017-07-25 LAB — CREATININE, SERUM
Creatinine, Ser: 0.94 mg/dL (ref 0.61–1.24)
GFR calc Af Amer: >60 mL/min (ref >60)
GFR calc non Af Amer: >60 mL/min (ref >60)

## 2017-07-25 LAB — MAGNESIUM
Magnesium: 2.5 mg/dL — ABNORMAL HIGH (ref 1.7–2.4)
Magnesium: 2.3 mg/dL (ref 1.7–2.4)

## 2017-07-25 MED ORDER — INSULIN ASPART 100 UNIT/ML ~~LOC~~ SOLN: 0.0000 [IU] | SUBCUTANEOUS | Status: DC

## 2017-07-25 MED ORDER — ENOXAPARIN SODIUM 40 MG/0.4ML ~~LOC~~ SOLN
40.0000 mg | Freq: Every day | SUBCUTANEOUS | Status: DC
  Administered 2017-07-25 – 2017-07-28 (×4): 40 mg via SUBCUTANEOUS
  Filled 2017-07-25 (×4): qty 0.4

## 2017-07-25 MED ORDER — INSULIN ASPART 100 UNIT/ML ~~LOC~~ SOLN
0.0000 [IU] | SUBCUTANEOUS | Status: DC
  Administered 2017-07-25: 2 [IU] via SUBCUTANEOUS
  Administered 2017-07-25: 4 [IU] via SUBCUTANEOUS
  Administered 2017-07-26 (×3): 2 [IU] via SUBCUTANEOUS

## 2017-07-25 MED FILL — Lidocaine HCl Local Soln Prefilled Syringe 100 MG/5ML (2%): INTRAMUSCULAR | Qty: 5 | Status: AC

## 2017-07-25 MED FILL — Mannitol IV Soln 20%: INTRAVENOUS | Qty: 500 | Status: CN

## 2017-07-25 MED FILL — Sodium Chloride IV Soln 0.9%: INTRAVENOUS | Qty: 3000 | Status: CN

## 2017-07-25 MED FILL — Lidocaine HCl Local Soln Prefilled Syringe 100 MG/5ML (2%): INTRAMUSCULAR | Qty: 5 | Status: CN

## 2017-07-25 MED FILL — Mannitol IV Soln 20%: INTRAVENOUS | Qty: 500 | Status: AC

## 2017-07-25 MED FILL — Sodium Bicarbonate IV Soln 8.4%: INTRAVENOUS | Qty: 50 | Status: CN

## 2017-07-25 MED FILL — Heparin Sodium (Porcine) Inj 1000 Unit/ML: INTRAMUSCULAR | Qty: 30 | Status: AC

## 2017-07-25 MED FILL — Electrolyte-R (PH 7.4) Solution: INTRAVENOUS | Qty: 3000 | Status: AC

## 2017-07-25 MED FILL — Heparin Sodium (Porcine) Inj 1000 Unit/ML: INTRAMUSCULAR | Qty: 20 | Status: CN

## 2017-07-25 MED FILL — Heparin Sodium (Porcine) Inj 1000 Unit/ML: INTRAMUSCULAR | Qty: 20 | Status: AC

## 2017-07-25 MED FILL — Sodium Chloride IV Soln 0.9%: INTRAVENOUS | Qty: 3000 | Status: AC

## 2017-07-25 MED FILL — Potassium Chloride Inj 2 mEq/ML: INTRAVENOUS | Qty: 40 | Status: AC

## 2017-07-25 MED FILL — Electrolyte-R (PH 7.4) Solution: INTRAVENOUS | Qty: 3000 | Status: CN

## 2017-07-25 MED FILL — Sodium Bicarbonate IV Soln 8.4%: INTRAVENOUS | Qty: 50 | Status: AC

## 2017-07-25 MED FILL — Magnesium Sulfate Inj 50%: INTRAMUSCULAR | Qty: 10 | Status: AC

## 2017-07-25 NOTE — Plan of Care (Signed)
Patient sitting in chair, tolerated ambulating in hall on room air.  No compaints of pain or discomfort at this time.  Monitoring.

## 2017-07-25 NOTE — Progress Notes (Signed)
      [address] SpringsSuite [address]       [place] [address]             [phone]     POD # 1 CABG x 3  Resting comfortably  BP 106/64   Pulse 78   Temp 98 F (36.7 C) (Oral)   Resp 19   Ht 6' (1.829 m)   Wt 167 lb 3.2 oz (75.8 kg)   SpO2 100%   BMI 22.68 kg/m   Intake/Output Summary (Last 24 hours) at 07/25/2017 1840 Last data filed at 07/25/2017 1800 Gross per 24 hour  Intake 2316.53 ml  Output 1680 ml  Net 636.53 ml    K= 4.7, Hct= 28  Doing well POD # 1  Steven C. Roxan Hockey, MD [facility] [phone]

## 2017-07-25 NOTE — Discharge Summary (Signed)
Physician Discharge Summary  Patient ID: [name] MRN: [id number] DOB/AGE: Dec 14, 1940 77 y.o.  Admit date: 07/24/2017 Discharge date: 07/29/2017  Admission Diagnoses:  Patient Active Problem List   Diagnosis Date Noted  . Coronary artery disease without angina pectoris 07/12/2017  . HFrEF (heart failure with reduced ejection fraction) ([place]) 07/07/2017  . Severe aortic stenosis 07/07/2017  . Mitral regurgitation 07/07/2017  . Acute on chronic systolic heart failure ([place]) 07/07/2017  . Gout 02/12/2013  . Unspecified essential hypertension 02/12/2013  . GERD (gastroesophageal reflux disease) 02/12/2013   Discharge Diagnoses:   Patient Active Problem List   Diagnosis Date Noted  . S/P left atrial appendage ligation 07/25/2017  . S/P AVR (aortic valve replacement) 07/25/2017  . 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  . Coronary artery disease without angina pectoris 07/12/2017  . HFrEF (heart failure with reduced ejection fraction) ([place]) 07/07/2017  . Severe aortic stenosis 07/07/2017  . Mitral regurgitation 07/07/2017  . Acute on chronic systolic heart failure ([place]) 07/07/2017  . Gout 02/12/2013  . Unspecified essential hypertension 02/12/2013  . GERD (gastroesophageal reflux disease) 02/12/2013   Discharged Condition: good  History of Present Illness:  [name] is a 77 yo male with known history of HTN, GERD, multifactorial anemia, and recently diagnosed, aortic stenosis, mitral regurgitation, and LV dysfunction with heart failure.  The patient remains physically active bicycling, attends the gym, and plays disc golf several times per week.  The patient does have a strong family history of CAD with a father who had his first MI in his 25s.  The patient considers himself to be healthy and is a vegetarian.  The patient has been in his normal state of health until recently.  He has noticed over the past 2-3 months that he fatigues more easily which he attributed to his age.  However,  approximately 2 weeks ago the patient was riding his bicycle and was unable to ride up a hill he had done several times in the past.  The patient also developed exertional shortness of breath with fatigue.  He also noticed his legs became more swollen.  He was unable to complete household chores without being worn out.  Due to these symptoms he presented to his PCP for evaluation.  He was subsequently referred for evaluation by a Cardiologist.  He was evaluated by Dr. Virgina Jock who started the patient on oral lasix and ordered and Echocardiogram to be completed.  This was done on 06/29/2017 and showed moderate LV dysfunction with an EF of 35-40%, mild hypertrophy, bilateral atrial enlargement, moderate to severe aortic stenosis, mitral regurgitation, mild tricuspid regurgitation, and mild pulmonary hypertension.  On follow up visit the patient continued to feel poorly resulting in admission for IV diuretics and further workup.  He responded well to diuretics.  He underwent a cardiac catheterization with dobutamine challenge which showed severe multivessel CAD.  It was felt surgical intervention would be required and TCTS consult was requested.  He was evaluated by Dr. Cyndia Bent who was in agreement the patient would require coronary bypass grafting, aortic valve replacement and possible mitral valve repair.  The risks and benefits of the procedure were explained to the patient and he was agreeable to proceed.  He stable from medical standpoint and discharged home prior to proceeding with surgery.  Hospital Course:   [name] presented to [facility] on 07/24/2017.  He was taken to the operating room and underwent CABG x 3 utilizing LIMA to LAD, SVG to PDA off Left  Circumflex, and SVG to RCA.  He also underwent Aortic Valve Replacement with a 23 mm Edwards Inspiris Resilia valve, clipping of his LA appendage with a 50 Atricure Pro2 Clip, and endoscopic harvest of greater saphenous vein from his right leg.   He tolerated the procedure without difficulty and was taken to the SICU in stable condition.  He was extubated the evening of surgery.  During his stay in the SICU the patient was weaned off Dopamine and Neosynephrine as hemodynamics allowed.  He was maintaining NSR.  He was ambulating independently.  He was medically stable for transfer to the telemetry unit on 07/27/2017.  He continued to make progress.  His blood pressure had trended up in the 110s, so he was started on low dose Coreg as his BP allowed.  He complained of not urinating much.  He states however this was present prior to surgery.  He was taking his home dose of Flomax.  He was started on a low dose of oral Lasix for mild volume overload.  Bladder scan showed no post void residual urine to be present.  He remained in NSR.  His pacing wires were removed without difficulty prior to discharge.  He participated in cardiac rehab without difficulty.  He initially thought he wanted to be discharged to SNF.  However, with how well he is doing he has decided to be discharged home.  Home health nursing and physical therapy arrangements have been made.  His incisions are healing without evidence of infection.  His pain is controlled.  He is tolerating a heart healthy diet.  He is medically stable for discharge home today.      Significant Diagnostic Studies: cardiac graphics:   Echocardiogram:   - Left ventricle: Systolic function was moderately to severely   reduced. The estimated ejection fraction was in the range of 30%   to 35%. Diffuse hypokinesis. - Aortic valve: Severely calcified leaflets. Valve mobility was   severely restricted.   Peak vel 3.6 m/sec, mean PG 26 mmHg. AVA 0.7 cm2 - Aorta: Moderate atheromatous plaque aortic arch. - Left atrium: The atrium was moderately dilated. No evidence of   thrombus in the atrial cavity or appendage. Emptying velocity was   moderately reduced. - Tricuspid valve: There was mild-moderate  regurgitation. Peak   RA-RV gradient 37 mmhg  Angiography:   LM: Normal LAD: Severely calcified prox LAD 80% stenosis LCx: Codominant. Severely calcified and tortuous. Prox LCx 70%, mid Lcx 60%, followed by 90% stenosis RCA: Codominant, but relatively small. Severely calcified. Multiple tandem prox-mid 90% stneoses  Dobutamine stress challenge: While AVAi increased to 0.7 cm2, mean PG also increased to 45 mmHg. I suspect this is due to patient's high cardiac output at baseline. This would still severe low flow low gradient severe aortic stenosis.   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 Median Sternotomy 2. Extracorporeal circulation 3.   Coronary artery bypass grafting x 3   Left internal mammary graft to the LAD  SVG to distal LCX  SVG to RCA  4.   Endoscopic vein harvest from the left leg 5.   Aortic valve replacement using a 23 mm Edwards INSPIRIS RESILIA pericardial valve 6.   Clipping of left atrial appendage  Discharge Exam: Blood pressure 109/81, pulse 69, temperature (!) 97.5 F (36.4 C), temperature source Oral, resp. rate 14, height 6' (1.829 m), weight 174 lb 11.2 oz (79.2 kg), SpO2 100 %.  General appearance: alert,  cooperative and no distress Heart: regular rate and rhythm Lungs: clear to auscultation bilaterally Abdomen: soft, non-tender; bowel sounds normal; no masses,  no organomegaly Extremities: edema trace to mild pitting Wound: clean and dry  Discharge disposition: 01-Home or Self Care    Discharge Medications:  The patient has been discharged on:   1.Beta Blocker:  Yes [ x  ]                              No   [   ]                              If No, reason:  2.Ace Inhibitor/ARB: Yes [   ]                                     No  [ x   ]                                     If No, reason: labile BP  3.Statin:   Yes [x   ]                  No  [   ]                  If No, reason:  4.Ecasa:  Yes  [   x ]                  No   [   ]                  If No, reason:      Allergies as of 07/29/2017   No Known Allergies     Medication List    TAKE these medications   acetaminophen 500 MG tablet Commonly known as:  TYLENOL Take 2 tablets (1,000 mg total) by mouth every 6 (six) hours as needed for mild pain or fever.   allopurinol 100 MG tablet Commonly known as:  ZYLOPRIM Take 200 mg by mouth every morning.   aspirin 325 MG EC tablet Take 1 tablet (325 mg total) by mouth daily.   atorvastatin 20 MG tablet Commonly known as:  LIPITOR Take 1 tablet (20 mg total) by mouth daily at 6 PM.   carvedilol 3.125 MG tablet Commonly known as:  COREG Take 1 tablet (3.125 mg total) by mouth 2 (two) times daily with a meal.   docusate sodium 100 MG capsule Commonly known as:  COLACE Take 1 capsule (100 mg total) by mouth 2 (two) times daily as needed for mild constipation.   fexofenadine 180 MG tablet Commonly known as:  ALLEGRA Take 180 mg by mouth daily.   tamsulosin 0.4 MG Caps capsule Commonly known as:  FLOMAX Take 0.4 mg by mouth daily.   torsemide 20 MG tablet Commonly known as:  DEMADEX Take 1 tablet (20 mg total) by mouth 2 (two) times daily.   traMADol 50 MG tablet Commonly known as:  ULTRAM Take 1 tablet (50 mg total) by mouth every 6 (six) hours as needed for moderate pain.      Follow-up Information    [facility]  Follow up on 08/28/2017.   Specialty:  Cardiothoracic Surgery Why:  Appointment is at 1:00, please get CXR at 12:30 at [place] located on first floor of our office building Contact information: [place], [place] [phone]       Nigel Mormon, MD Follow up on 08/09/2017.   Specialty:  Cardiology Why:  Appointment is at 11:45 Contact information: [place] [address] [phone]        [facility] Follow up on 08/04/2017.   Specialty:  Cardiothoracic Surgery Why:  Appointment is at 11:00, for suture removal Contact information: [address] [place], [place] [phone]          Signed: Ellwood Handler 07/29/2017, 7:44 AM

## 2017-07-25 NOTE — Progress Notes (Signed)
1 Day Post-Op Procedure(s) (LRB): CORONARY ARTERY BYPASS GRAFTING (CABG) times 3 using the left greater saphenous vein harvested endoscopically and left internal mammary artery. (N/A) AORTIC VALVE REPLACEMENT (AVR) using 14mm Inspiris Aortic Valve (N/A) CLIPPING OF ATRIAL APPENDAGE using a 66mm Atricure clip (N/A) TRANSESOPHAGEAL ECHOCARDIOGRAM (TEE) (N/A) Subjective: Bored and ready to move. No complaints  Objective: Vital signs in last 24 hours: Temp:  [97 F (36.1 C)-99.3 F (37.4 C)] 98.2 F (36.8 C) (06/18 0700) Pulse Rate:  [82-97] 88 (06/18 0700) Cardiac Rhythm: Atrial paced (06/18 0400) Resp:  [12-24] 15 (06/18 0700) BP: (89-137)/(49-77) 116/73 (06/18 0700) SpO2:  [23 %-100 %] 99 % (06/18 0700) Arterial Line BP: (81-136)/(39-60) 123/54 (06/18 0700) FiO2 (%):  [36 %-50 %] 36 % (06/17 1835) Weight:  [75.8 kg (167 lb 3.2 oz)] 75.8 kg (167 lb 3.2 oz) (06/18 0400)  Hemodynamic parameters for last 24 hours: PAP: (16-46)/(6-20) 24/9 CO:  [4.8 L/min-6.3 L/min] 4.8 L/min CI:  [2.5 L/min/m2-3.2 L/min/m2] 2.5 L/min/m2  Intake/Output from previous day: 06/17 0701 - 06/18 0700 In: 6340.6 [I.V.:4520.6; Blood:700; IV Piggyback:1120] Out: 2683 [Urine:2355; Blood:800; Chest Tube:490] Intake/Output this shift: No intake/output data recorded.  General appearance: alert and cooperative Neurologic: intact Heart: regular rate and rhythm and rub from tubes Lungs: clear to auscultation bilaterally Extremities: extremities normal, atraumatic, no cyanosis or edema Wound: dressings dry  Lab Results: Recent Labs    07/24/17 2012 07/24/17 2014 07/25/17 0337  WBC 9.4  --  8.4  HGB 10.0* 9.9* 10.1*  HCT 30.6* 29.0* 30.8*  PLT 169  --  158   BMET:  Recent Labs    07/24/17 2014 07/25/17 0337  NA 135 134*  K 4.6 4.5  CL 99* 102  CO2  --  25  GLUCOSE 137* 125*  BUN 19 17  CREATININE 0.80 0.92  CALCIUM  --  8.2*    PT/INR:  Recent Labs    07/24/17 1411  LABPROT 17.2*  INR  1.42   ABG    Component Value Date/Time   PHART 7.363 07/24/2017 1832   HCO3 26.1 07/24/2017 1832   TCO2 23 07/24/2017 2014   ACIDBASEDEF 4.0 (H) 07/24/2017 1608   O2SAT 99.0 07/24/2017 1832   CBG (last 3)  Recent Labs    07/24/17 1930 07/24/17 2336 07/25/17 0339  GLUCAP 102* 135* 120*   CXR: clear  ECG: sinus RBBB  Assessment/Plan: S/P Procedure(s) (LRB): CORONARY ARTERY BYPASS GRAFTING (CABG) times 3 using the left greater saphenous vein harvested endoscopically and left internal mammary artery. (N/A) AORTIC VALVE REPLACEMENT (AVR) using 56mm Inspiris Aortic Valve (N/A) CLIPPING OF ATRIAL APPENDAGE using a 37mm Atricure clip (N/A) TRANSESOPHAGEAL ECHOCARDIOGRAM (TEE) (N/A)  POD 1 Hemodynamically stable on dop 2, low dose levophed in sinus rhythm. Hold beta blocker for now since on pressors. Wean levophed and dopamine as tolerated. Mobilize d/c tubes/lines Continue foley due to patient in ICU and urinary output monitoring See progression orders   LOS: 1 day    [name] 07/25/2017

## 2017-07-25 NOTE — Progress Notes (Signed)
CSW consulted for potential placement needs- per consult they would want [facility] if needed (both patient and his wife live in [facility] at baseline)  CSW will continue to follow and await PT recommendations regarding level of care needed at [place], [place] Social Worker [phone]

## 2017-07-26 ENCOUNTER — Inpatient Hospital Stay (HOSPITAL_COMMUNITY): Payer: Medicare Other

## 2017-07-26 LAB — CBC
HCT: 27.8 % — ABNORMAL LOW (ref 39.0–52.0)
Hemoglobin: 8.9 g/dL — ABNORMAL LOW (ref 13.0–17.0)
MCH: 30.8 pg (ref 26.0–34.0)
MCHC: 32 g/dL (ref 30.0–36.0)
MCV: 96.2 fL (ref 78.0–100.0)
Platelets: 134 10*3/uL — ABNORMAL LOW (ref 150–400)
RBC: 2.89 MIL/uL — ABNORMAL LOW (ref 4.22–5.81)
RDW: 14.6 % (ref 11.5–15.5)
WBC: 9.2 10*3/uL (ref 4.0–10.5)

## 2017-07-26 LAB — GLUCOSE, CAPILLARY
Glucose-Capillary: 136 mg/dL — ABNORMAL HIGH (ref 65–99)
Glucose-Capillary: 98 mg/dL (ref 65–99)
Glucose-Capillary: 120 mg/dL — ABNORMAL HIGH (ref 65–99)
Glucose-Capillary: 111 mg/dL — ABNORMAL HIGH (ref 65–99)
Glucose-Capillary: 125 mg/dL — ABNORMAL HIGH (ref 65–99)

## 2017-07-26 LAB — BASIC METABOLIC PANEL
Anion gap: 5 (ref 5–15)
BUN: 23 mg/dL — ABNORMAL HIGH (ref 6–20)
CO2: 26 mmol/L (ref 22–32)
Calcium: 8.5 mg/dL — ABNORMAL LOW (ref 8.9–10.3)
Chloride: 100 mmol/L — ABNORMAL LOW (ref 101–111)
Creatinine, Ser: 0.9 mg/dL (ref 0.61–1.24)
GFR calc Af Amer: >60 mL/min (ref >60)
GFR calc non Af Amer: >60 mL/min (ref >60)
Glucose, Bld: 102 mg/dL — ABNORMAL HIGH (ref 65–99)
Potassium: 4.7 mmol/L (ref 3.5–5.1)
Sodium: 131 mmol/L — ABNORMAL LOW (ref 135–145)

## 2017-07-26 NOTE — Progress Notes (Addendum)
[address]       [place] [address]             [phone]      2 Days Post-Op Procedure(s) (LRB): CORONARY ARTERY BYPASS GRAFTING (CABG) times 3 using the left greater saphenous vein harvested endoscopically and left internal mammary artery. (N/A) AORTIC VALVE REPLACEMENT (AVR) using 66mm Inspiris Aortic Valve (N/A) CLIPPING OF ATRIAL APPENDAGE using a 15mm Atricure clip (N/A) TRANSESOPHAGEAL ECHOCARDIOGRAM (TEE) (N/A)   Subjective:  [name] has no new complaints. Continues to feel well and states he is hoping having this surgery will allow him to be as active as he was prior to developing symptoms.  + ambulation  Starting to pass gas.  Objective: Vital signs in last 24 hours: Temp:  [97.8 F (36.6 C)-98.7 F (37.1 C)] 98.7 F (37.1 C) (06/19 0734) Pulse Rate:  [73-88] 85 (06/19 0800) Cardiac Rhythm: Normal sinus rhythm (06/19 0743) Resp:  [14-24] 21 (06/19 0800) BP: (92-115)/(50-87) 110/76 (06/19 0800) SpO2:  [96 %-100 %] 98 % (06/19 0800) Arterial Line BP: (96-119)/(47-57) 106/50 (06/18 1500) Weight:  [169 lb 8.5 oz (76.9 kg)] 169 lb 8.5 oz (76.9 kg) (06/19 0500)  Intake/Output from previous day: 06/18 0701 - 06/19 0700 In: 1531.3 [P.O.:720; I.V.:511.2; IV Piggyback:300.1] Out: 905 [Urine:905] Intake/Output this shift: Total I/O In: 40 [I.V.:40] Out: -   General appearance: alert, cooperative and no distress Heart: regular rate and rhythm Lungs: clear to auscultation bilaterally Abdomen: soft, non-tender; bowel sounds normal; no masses,  no organomegaly Extremities: edema none Wound: clean and dry  Lab Results: Recent Labs    07/25/17 1653 07/25/17 1659 07/26/17 0415  WBC 8.7  --  9.2  HGB 9.5* 9.5* 8.9*  HCT 29.8* 28.0* 27.8*  PLT 146*  --  134*   BMET:  Recent Labs    07/25/17 0337  07/25/17 1659 07/26/17 0415  NA 134*  --  133* 131*  K 4.5  --  4.7 4.7  CL 102  --  97* 100*  CO2 25  --   --  26  GLUCOSE 125*  --   107* 102*  BUN 17  --  21* 23*  CREATININE 0.92   < > 0.80 0.90  CALCIUM 8.2*  --   --  8.5*   < > = values in this interval not displayed.    PT/INR:  Recent Labs    07/24/17 1411  LABPROT 17.2*  INR 1.42   ABG    Component Value Date/Time   PHART 7.363 07/24/2017 1832   HCO3 26.1 07/24/2017 1832   TCO2 25 07/25/2017 1659   ACIDBASEDEF 4.0 (H) 07/24/2017 1608   O2SAT 99.0 07/24/2017 1832   CBG (last 3)  Recent Labs    07/25/17 1930 07/25/17 2329 07/26/17 0729  GLUCAP 177* 134* 136*    Assessment/Plan: S/P Procedure(s) (LRB): CORONARY ARTERY BYPASS GRAFTING (CABG) times 3 using the left greater saphenous vein harvested endoscopically and left internal mammary artery. (N/A) AORTIC VALVE REPLACEMENT (AVR) using 28mm Inspiris Aortic Valve (N/A) CLIPPING OF ATRIAL APPENDAGE using a 25mm Atricure clip (N/A) TRANSESOPHAGEAL ECHOCARDIOGRAM (TEE) (N/A)  1. CV- NSR, off all drips, BP remains labile running 90-100s- not currently on BB, not sure if BP will tolerate, will continue to hold off today can hopefully start prior to discharge 2. Pulm- no acute issues, weaning oxygen as tolerated, CXR looks good, no significant effusions 3. Renal- creatinine WNL, no edema present on exam,  wt minimally elevated since surgery, no diuretics needed at this time 4. Expected post operative blood loss anemia, mild hgb at 8.9 5. Expected Thrombocytopenia, stable  6. CBGs controlled, continue SSIP for now, if remains controlled will d/c tomorrow 7. Dispo- patient stable, BP remains on the low side, will hold off on BB for now, no diuretics needed at this time, continue current care, will transfer to 4E   LOS: 2 days    [name] 07/26/2017   Chart reviewed, patient examined, agree with above. He is doing very well. BP on low normal side so will hold off on beta blocker. Weight is 4.5 lbs over preop so will need some gentle diuresis as BP allows over the next few days.

## 2017-07-26 NOTE — Plan of Care (Signed)
  Problem: Clinical Measurements: Goal: Ability to maintain clinical measurements within normal limits will improve Outcome: Progressing Goal: Diagnostic test results will improve Outcome: Progressing Goal: Respiratory complications will improve Outcome: Progressing Goal: Cardiovascular complication will be avoided Outcome: Progressing   Problem: Activity: Goal: Risk for activity intolerance will decrease Outcome: Progressing   Problem: Nutrition: Goal: Adequate nutrition will be maintained Outcome: Progressing   Problem: Coping: Goal: Level of anxiety will decrease Outcome: Progressing

## 2017-07-27 LAB — GLUCOSE, CAPILLARY
Glucose-Capillary: 106 mg/dL — ABNORMAL HIGH (ref 65–99)
Glucose-Capillary: 110 mg/dL — ABNORMAL HIGH (ref 65–99)
Glucose-Capillary: 123 mg/dL — ABNORMAL HIGH (ref 65–99)
Glucose-Capillary: 114 mg/dL — ABNORMAL HIGH (ref 65–99)
Glucose-Capillary: 124 mg/dL — ABNORMAL HIGH (ref 65–99)
Glucose-Capillary: 137 mg/dL — ABNORMAL HIGH (ref 65–99)

## 2017-07-27 MED ORDER — POTASSIUM CHLORIDE CRYS ER 20 MEQ PO TBCR
20.0000 meq | EXTENDED_RELEASE_TABLET | Freq: Every day | ORAL | Status: DC
  Administered 2017-07-27 – 2017-07-29 (×3): 20 meq via ORAL
  Filled 2017-07-27 (×3): qty 1

## 2017-07-27 MED ORDER — CARVEDILOL 3.125 MG PO TABS
3.1250 mg | ORAL_TABLET | Freq: Two times a day (BID) | ORAL | Status: DC
  Administered 2017-07-27 – 2017-07-29 (×5): 3.125 mg via ORAL
  Filled 2017-07-27 (×5): qty 1

## 2017-07-27 MED ORDER — FUROSEMIDE 40 MG PO TABS
40.0000 mg | ORAL_TABLET | Freq: Every day | ORAL | Status: DC
  Administered 2017-07-27 – 2017-07-29 (×3): 40 mg via ORAL
  Filled 2017-07-27 (×3): qty 1

## 2017-07-27 MED ORDER — TRAMADOL HCL 50 MG PO TABS: 50.0000 mg | ORAL_TABLET | Freq: Four times a day (QID) | ORAL | Status: DC | PRN

## 2017-07-27 NOTE — Progress Notes (Signed)
Epicardial pacing wires removed per order. Wire ends intact. Vitals obtained and stable. Bedrest for 1 hr. Pt. In in bed with call light within reach. Will continue to monitor Jerald Kief, RN

## 2017-07-27 NOTE — Care Management Important Message (Signed)
Important Message  Patient Details  Name: [name] MRN: [id number] Date of Birth: 1940-04-23   Medicare Important Message Given:  Yes    Gilman Olazabal P [place] 07/27/2017, 3:11 PM

## 2017-07-27 NOTE — Progress Notes (Signed)
Ambulated in the hallway 500 ft using front wheel walker on room air tolerated very well.

## 2017-07-27 NOTE — Discharge Instructions (Signed)
1. Please obtain vital signs at least one time daily 2.Please weigh the patient daily. If he or she continues to gain weight or develops lower extremity edema, contact the office at [phone]. 3. Ambulate patient at least three times daily and please use sternal precautions. 4. Please remove chest tubes sutures on Friday 6/28   Discharge Instructions:  1. You may shower, please wash incisions daily with soap and water and keep dry.  If you wish to cover wounds with dressing you may do so but please keep clean and change daily.  No tub baths or swimming until incisions have completely healed.  If your incisions become red or develop any drainage please call our office at [phone]  2. No Driving until cleared by Dr. Vivi Martens office and you are no longer using narcotic pain medications  3. Monitor your weight daily.. Please use the same scale and weigh at same time... If you gain 5-10 lbs in 48 hours with associated lower extremity swelling, please contact our office at [phone]  4. Fever of 101.5 for at least 24 hours with no source, please contact our office at [phone]  5. Activity- up as tolerated, please walk at least 3 times per day.  Avoid strenuous activity, no lifting, pushing, or pulling with your arms over 8-10 lbs for a minimum of 6 weeks  6. If any questions or concerns arise, please do not hesitate to contact our office at [phone]

## 2017-07-27 NOTE — Progress Notes (Signed)
CARDIAC REHAB PHASE I   PRE:  Rate/Rhythm: 80 SR with PACs    BP: sitting 113/83    SaO2: 99 RA  MODE:  Ambulation: 470 ft   POST:  Rate/Rhythm: 102 ST with PACs    BP: sitting 131/88     SaO2: 100 RA  Pt moving well but needed a reminder of sternal precautions. Ambulated with min assist, no AD. Slight unsteadiness. He uses RW when alone. To bed, practiced getting up, then to recliner. Pt was thinking that he will need SNF due to his wife's lack of ability to care for him. However, he lives in a retirement community that has meals x3 a day, his wife drives, and he is moving well. We discussed going home with wife's supervision and he is happy about that. We did have this conversation twice so ? If pt is forgetful or just got confused about different "rehabs". He repeats himself as well. Will probably benefit from RW at home but will access tomorrow. 1000 mL on IS. [id number]  Darrick Meigs CES, ACSM 07/27/2017 11:22 AM

## 2017-07-27 NOTE — Progress Notes (Addendum)
[address]       [place] [address]             [phone]      3 Days Post-Op Procedure(s) (LRB): CORONARY ARTERY BYPASS GRAFTING (CABG) times 3 using the left greater saphenous vein harvested endoscopically and left internal mammary artery. (N/A) AORTIC VALVE REPLACEMENT (AVR) using 71mm Inspiris Aortic Valve (N/A) CLIPPING OF ATRIAL APPENDAGE using a 62mm Atricure clip (N/A) TRANSESOPHAGEAL ECHOCARDIOGRAM (TEE) (N/A)   Subjective:  Patient is doing okay for the most part.  He states he is not urinating very much, but states that he has been peeing in "morse code" for a little while now.  He states they put him on flomax for this.  He was able to move his bowels this morning  Objective: Vital signs in last 24 hours: Temp:  [97.6 F (36.4 C)-99.4 F (37.4 C)] 97.6 F (36.4 C) (06/20 0350) Pulse Rate:  [81-94] 82 (06/19 2000) Cardiac Rhythm: Heart block (06/20 0700) Resp:  [18-27] 21 (06/19 2000) BP: (102-119)/(67-101) 113/69 (06/19 2000) SpO2:  [90 %-100 %] 100 % (06/19 2000) Weight:  [171 lb 4.8 oz (77.7 kg)] 171 lb 4.8 oz (77.7 kg) (06/20 0348)  Intake/Output from previous day: 06/19 0701 - 06/20 0700 In: 700 [P.O.:660; I.V.:40] Out: 450 [Urine:450]  General appearance: alert, cooperative and no distress Heart: regular rate and rhythm Lungs: clear to auscultation bilaterally Abdomen: soft, non-tender; bowel sounds normal; no masses,  no organomegaly Extremities: edema trace Wound: clean and dry  Lab Results: Recent Labs    07/25/17 1653 07/25/17 1659 07/26/17 0415  WBC 8.7  --  9.2  HGB 9.5* 9.5* 8.9*  HCT 29.8* 28.0* 27.8*  PLT 146*  --  134*   BMET:  Recent Labs    07/25/17 0337  07/25/17 1659 07/26/17 0415  NA 134*  --  133* 131*  K 4.5  --  4.7 4.7  CL 102  --  97* 100*  CO2 25  --   --  26  GLUCOSE 125*  --  107* 102*  BUN 17  --  21* 23*  CREATININE 0.92   < > 0.80 0.90  CALCIUM 8.2*  --   --  8.5*   < > = values  in this interval not displayed.    PT/INR:  Recent Labs    07/24/17 1411  LABPROT 17.2*  INR 1.42   ABG    Component Value Date/Time   PHART 7.363 07/24/2017 1832   HCO3 26.1 07/24/2017 1832   TCO2 25 07/25/2017 1659   ACIDBASEDEF 4.0 (H) 07/24/2017 1608   O2SAT 99.0 07/24/2017 1832   CBG (last 3)  Recent Labs    07/26/17 1733 07/26/17 2012 07/27/17 0346  GLUCAP 111* 125* 106*    Assessment/Plan: S/P Procedure(s) (LRB): CORONARY ARTERY BYPASS GRAFTING (CABG) times 3 using the left greater saphenous vein harvested endoscopically and left internal mammary artery. (N/A) AORTIC VALVE REPLACEMENT (AVR) using 7mm Inspiris Aortic Valve (N/A) CLIPPING OF ATRIAL APPENDAGE using a 21mm Atricure clip (N/A) TRANSESOPHAGEAL ECHOCARDIOGRAM (TEE) (N/A)  1. CV- NSR, BP is up in the 110s- will start low dose BB and see if he can tolerate 2. Pulm- no acute issues, continue IS 3. Renal- creatinine has been WNL, weight is mildly elevated, will start low dose Lasix today 4. GU- decreased urinary output, which has been present since surgery, on home Flomax, will bladder scan to make sure patient is emptying  his bladder 5. CBGs controlled, not a diabetic will d/c SSIP 6. Dispo- patient stable, try low dose BB today, lasix for U/O and mild volume overload, bladder scan to ensure he is fully emptying his bladder, d/c EPW today   LOS: 3 days    Ellwood Handler 07/27/2017   Chart reviewed, patient examined, agree with above. He is progressing well. Started on lasix today for mild volume excess with weight 6 lbs over preop. He says he is urinating more since taking the lasix. Continue IS, ambulation. Plan home Saturday.

## 2017-07-28 LAB — BASIC METABOLIC PANEL
Anion gap: 6 (ref 5–15)
BUN: 26 mg/dL — ABNORMAL HIGH (ref 6–20)
CO2: 25 mmol/L (ref 22–32)
Calcium: 8.4 mg/dL — ABNORMAL LOW (ref 8.9–10.3)
Chloride: 95 mmol/L — ABNORMAL LOW (ref 101–111)
Creatinine, Ser: 0.94 mg/dL (ref 0.61–1.24)
GFR calc Af Amer: >60 mL/min (ref >60)
GFR calc non Af Amer: >60 mL/min (ref >60)
Glucose, Bld: 104 mg/dL — ABNORMAL HIGH (ref 65–99)
Potassium: 4.9 mmol/L (ref 3.5–5.1)
Sodium: 126 mmol/L — ABNORMAL LOW (ref 135–145)

## 2017-07-28 LAB — GLUCOSE, CAPILLARY
Glucose-Capillary: 107 mg/dL — ABNORMAL HIGH (ref 65–99)
Glucose-Capillary: 125 mg/dL — ABNORMAL HIGH (ref 65–99)

## 2017-07-28 MED ORDER — ATORVASTATIN CALCIUM 20 MG PO TABS
20.0000 mg | ORAL_TABLET | Freq: Every day | ORAL | Status: DC
Start: 2017-07-28 — End: 2017-07-29
  Administered 2017-07-28: 20 mg via ORAL
  Filled 2017-07-28: qty 1

## 2017-07-28 NOTE — Progress Notes (Addendum)
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  Patient states he is feeling pretty good.  He has decided to go home instead of rehab at discharge.  I told patient I will arrange home nursing and PT for discharge.  He is voiding without difficulty and states the output has increased with addition of Lasix.   + ambulation  + BM  Objective: Vital signs in last 24 hours: Temp:  [97.5 F (36.4 C)-97.9 F (36.6 C)] 97.6 F (36.4 C) (06/21 0347) Pulse Rate:  [71-83] 71 (06/21 0347) Cardiac Rhythm: Normal sinus rhythm;Bundle branch block;Heart block (06/20 1900) Resp:  [19-20] 20 (06/21 0347) BP: (109-113)/(70-83) 109/70 (06/21 0347) SpO2:  [96 %-100 %] 96 % (06/21 0347) Weight:  [164 lb (74.4 kg)] 164 lb (74.4 kg) (06/21 0347)  Intake/Output from previous day: 06/20 0701 - 06/21 0700 In: 960 [P.O.:960] Out: 977 [Urine:976; Stool:1]  General appearance: alert, cooperative and no distress Heart: regular rate and rhythm Lungs: clear to auscultation bilaterally Abdomen: soft, non-tender; bowel sounds normal; no masses,  no organomegaly Extremities: edema 1-2+ pitting Wound: clean and dry  Lab Results: Recent Labs    07/25/17 1653 07/25/17 1659 07/26/17 0415  WBC 8.7  --  9.2  HGB 9.5* 9.5* 8.9*  HCT 29.8* 28.0* 27.8*  PLT 146*  --  134*   BMET:  Recent Labs    07/26/17 0415 07/28/17 0453  NA 131* 126*  K 4.7 4.9  CL 100* 95*  CO2 26 25  GLUCOSE 102* 104*  BUN 23* 26*  CREATININE 0.90 0.94  CALCIUM 8.5* 8.4*    PT/INR: No results  for input(s): LABPROT, INR in the last 72 hours. 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 (N/A) AORTIC VALVE REPLACEMENT (AVR) using 52mm Inspiris Aortic Valve (N/A) CLIPPING OF ATRIAL APPENDAGE using a 52mm Atricure clip (N/A) TRANSESOPHAGEAL ECHOCARDIOGRAM (TEE) (N/A)  1. CV- NSR with PVCs, BP controlled- would benefit from increase in BB, but I dont think BP will tolerate, will continue Coreg at 3.125 mg BID 2. Pulm- no acute issues, continue IS 3. Renal- creatinine WNL, weight is trending down, pitting edema on exam, will continue Lasix, add TED hose 4. Dispo- patient stable, will add TED Hose for LE edema, having PVCS unfortunately I don't think he will tolerate an increase in his BB, will continue coreg at current dose, will make H/H arrangements, if remains stable will plan to d/c home tomorrow   LOS: 4 days    Ellwood Handler 07/28/2017   Chart reviewed, patient examined, agree  with above. He is doing well and diuresing some. I think he can go home tomorrow on lasix for another 5 days.

## 2017-07-28 NOTE — Progress Notes (Signed)
Pt walked independently with RW. Steady. HR stable at 88 SR. Less PACs today. Ed completed with good reception. Will send referral to [facility]. Encouraged walking x3-4 a day and IS.  [id number] Yves Dill CES, ACSM 11:10 AM 07/28/2017

## 2017-07-29 MED ORDER — ATORVASTATIN CALCIUM 20 MG PO TABS: 20.0000 mg | ORAL_TABLET | Freq: Every day | ORAL | 3 refills | Status: DC

## 2017-07-29 MED ORDER — ACETAMINOPHEN 500 MG PO TABS: 1000.0000 mg | ORAL_TABLET | Freq: Four times a day (QID) | ORAL | 0 refills | Status: AC | PRN

## 2017-07-29 MED ORDER — CARVEDILOL 3.125 MG PO TABS: 3.1250 mg | ORAL_TABLET | Freq: Two times a day (BID) | ORAL | 3 refills | Status: DC

## 2017-07-29 MED ORDER — ASPIRIN 325 MG PO TBEC: 325.0000 mg | DELAYED_RELEASE_TABLET | Freq: Every day | ORAL | 0 refills | Status: DC

## 2017-07-29 MED ORDER — TRAMADOL HCL 50 MG PO TABS: 50.0000 mg | ORAL_TABLET | Freq: Four times a day (QID) | ORAL | 0 refills | Status: DC | PRN

## 2017-07-29 NOTE — Care Management Note (Signed)
Case Management Note  Patient Details  Name: [name] MRN: [id number] Date of Birth: Jul 04, 1940  Subjective/Objective:                 SPoke w patient at bedside. Want [facility] for [facility], referral made to [place]. RW requested from [place]. No other CM needs.    Action/Plan:   Expected Discharge Date:  07/29/17               Expected Discharge Plan:  [place]  In-House Referral:     Discharge planning Services  CM Consult  Post Acute Care Choice:  Home Health, Durable Medical Equipment Choice offered to:  Patient  DME Arranged:  [facility] D[facility] Agency:  [facility] Arranged:  RN, PT [facility] Agency:  [facility] (now [facility])  Status of Service:  Completed, signed off  If discussed at [place] of Stay Meetings, dates discussed:    Additional Comments:  Carles Collet, RN 07/29/2017, 9:18 AM

## 2017-07-29 NOTE — Progress Notes (Addendum)
      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 (N/A) AORTIC VALVE REPLACEMENT (AVR) using 50mm Inspiris Aortic Valve (N/A) CLIPPING OF ATRIAL APPENDAGE using a 63mm Atricure clip (N/A) TRANSESOPHAGEAL ECHOCARDIOGRAM (TEE) (N/A)   Subjective:  No New complaints.  He is ready to go home states he is doing too well to be here.  +ambulation  + BM  Objective: Vital signs in last 24 hours: Temp:  [97.5 F (36.4 C)-98.1 F (36.7 C)] 97.5 F (36.4 C) (06/22 0511) Pulse Rate:  [69-87] 69 (06/21 2023) Cardiac Rhythm: Normal sinus rhythm (06/22 0700) Resp:  [14-20] 14 (06/22 0511) BP: (109-119)/(72-85) 109/81 (06/22 0511) SpO2:  [93 %-100 %] 100 % (06/22 0511) Weight:  [174 lb 11.2 oz (79.2 kg)] 174 lb 11.2 oz (79.2 kg) (06/22 0511)  Intake/Output from previous day: 06/21 0701 - 06/22 0700 In: 1080 [P.O.:1080] Out: 650 [Urine:650]  General appearance: alert, cooperative and no distress Heart: regular rate and rhythm Lungs: clear to auscultation bilaterally Abdomen: soft, non-tender; bowel sounds normal; no masses,  no organomegaly Extremities: edema trace to mild pitting Wound: clean and dry  Lab Results: No results for input(s): WBC, HGB, HCT, PLT in the last 72 hours. BMET:  Recent Labs    07/28/17 0453  NA 126*  K 4.9  CL 95*  CO2 25  GLUCOSE 104*  BUN 26*  CREATININE 0.94  CALCIUM 8.4*    PT/INR: No results for input(s): LABPROT, INR in the last 72 hours. 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 (N/A) AORTIC VALVE REPLACEMENT (AVR) using 72mm Inspiris Aortic Valve (N/A) CLIPPING OF ATRIAL APPENDAGE using a 3mm Atricure clip (N/A) TRANSESOPHAGEAL ECHOCARDIOGRAM (TEE) (N/A)  1. CV- NSR with PVCs, BP remains on the low side- continue Coreg 2. Pulm- no acute issues, continue IS 3. Renal- weight is trending down, edema is improving, will resume home regimen of toresemide at discharge 4. Dispo- patient stable, H/H arrangements have been made, will d/c home today   LOS: 5 days    Ellwood Handler 07/29/2017

## 2017-07-29 NOTE — Progress Notes (Signed)
07/29/2017 11:37 AM Discharge AVS meds taken today and those due this evening reviewed.  Follow-up appointments and when to call md reviewed.  D/C IV and TELE.  Questions and concerns addressed.   D/C home per orders. [place]

## 2017-07-31 ENCOUNTER — Telehealth: Payer: Self-pay

## 2017-07-31 LAB — BPAM RBC
Blood Product Expiration Date: 201907142359
Blood Product Expiration Date: 201907142359
ISSUE DATE / TIME: 201906170814
ISSUE DATE / TIME: 201906170814
Unit Type and Rh: 5100
Unit Type and Rh: 5100

## 2017-07-31 LAB — TYPE AND SCREEN
ABO/RH(D): O POS
Antibody Screen: NEGATIVE
Unit division: 0
Unit division: 0

## 2017-07-31 NOTE — Telephone Encounter (Signed)
Sheryle Hail, with [facility] called [phone] requesting verbal orders for [name] who is status post CABG x3 07/24/2017.  She was requesting verbal order for him to be seen for home health.  Verbal orders given, awaiting more orders.  She states that [name] will be seen in the home tomorrow by nursing for home health visits.  All questions answered.

## 2017-08-02 ENCOUNTER — Telehealth: Payer: Self-pay

## 2017-08-02 NOTE — Telephone Encounter (Signed)
Judeen Hammans, nurse with [facility] [phone] and stated that start of care was going to happen a day earlier than first originated.  Patient is to start home health today, 08/02/2017 instead of tomorrow as originally stated 08/03/2017.  Receipt verbalized.

## 2017-08-04 ENCOUNTER — Ambulatory Visit (INDEPENDENT_AMBULATORY_CARE_PROVIDER_SITE_OTHER): Payer: Self-pay

## 2017-08-04 ENCOUNTER — Telehealth: Payer: Self-pay

## 2017-08-04 DIAGNOSIS — Z4802 Encounter for removal of sutures: Secondary | ICD-10-CM

## 2017-08-04 NOTE — Telephone Encounter (Signed)
Ms. Markman contacted the office and left a voicemail on another staff's phone to which stated that he was having some behavior changes, and that he was monitoring the phone to not call back.  She wanted staff to look over his medications to see if anything new could be causing this recent change.    Mr. Hagood son, Nolon Nations contacted the office yesterday 08/03/2017 with concerns for his dad.  He stated that after his surgery he has had some personality changes and possible memory loss.  He stated that he has been "yelling" and "verbally scolding" his wife who lives with him in an independent living facility.  He requested any recommendations on the matter from Dr. Cyndia Bent.  Per Dr. Cyndia Bent, this is common after surgery and can last for a couple months, and usually resolves on it's own.  I did advise the son that if he was taking pain medication that could be the cause for the change, however he stated that his dad was not taking any pain medications.  I also stated that if he felt something needed to be done, he could also contact his PCP to further evaluate the changes.  Patient refused home health services with [facility].  The son was adamant that he have home health come to the house.  I advised him to contact the [facility] agency, as we cannot "force" a patient to use the services.  Today, 08/04/2017, patient was seen in the office with his wife for suture removal.  At this appointment, he seemed to be oriented and aware of what he was saying.  Melissa Harriston with [facility], [place], [phone] contacted the office and stated that Jonny, the son, would like home health to come BACK out to the house for another evaluation.  I gave a verbal order for PT, which would be the ONLY service this patient may benefit from. And an order for medical social work which the son/ wife requested.  I also told Melissa that if the patient refused again, this would be the last time we would approve  home health, as again, we cannot force the patient into doing anything he does not feel is needed.  Will continue to monitor.

## 2017-08-04 NOTE — Progress Notes (Signed)
Removed 3 sutures from chest tube sites with no signs of infection and patient tolerated well. All incision sites look great. He did have some bi-lat swelling of both ankles and feet. He denies any shortness of breath, coughing or chest pain.  His wife stated that he is not elevating his legs like he should. He is taking Demadex 20 mg bid.  [name] agreed to keep is leg elevated while he is not walking. If swelling continues or become worse, he will call the [facility] TS nurse. Home Health was D/C by patient after 1 visit. He states that he does not need them. He is in great spirits and excited about his future. He is scheduled to see Cardiologist next week and will schedule apt with PCP some time next month.

## 2017-08-25 ENCOUNTER — Other Ambulatory Visit: Payer: Self-pay | Admitting: Surgery

## 2017-08-25 DIAGNOSIS — Z951 Presence of aortocoronary bypass graft: Secondary | ICD-10-CM

## 2017-08-28 ENCOUNTER — Ambulatory Visit
Admission: RE | Admit: 2017-08-28 | Discharge: 2017-08-28 | Disposition: A | Discharge status: Home / Self Care | Payer: Medicare Other | Source: Ambulatory Visit | Attending: Surgery | Admitting: Surgery

## 2017-08-28 ENCOUNTER — Ambulatory Visit (INDEPENDENT_AMBULATORY_CARE_PROVIDER_SITE_OTHER): Payer: Self-pay | Admitting: Physician Assistant

## 2017-08-28 ENCOUNTER — Other Ambulatory Visit: Payer: Self-pay

## 2017-08-28 VITALS — BP 110/74 | HR 87 | Resp 16 | Ht 72.0 in | Wt 160.2 lb

## 2017-08-28 DIAGNOSIS — Z951 Presence of aortocoronary bypass graft: Secondary | ICD-10-CM

## 2017-08-28 DIAGNOSIS — Z9889 Other specified postprocedural states: Secondary | ICD-10-CM

## 2017-08-28 DIAGNOSIS — Z952 Presence of prosthetic heart valve: Secondary | ICD-10-CM

## 2017-08-28 MED ORDER — TORSEMIDE 20 MG PO TABS: 20.0000 mg | ORAL_TABLET | Freq: Every day | ORAL | 3 refills | Status: DC

## 2017-08-28 NOTE — Patient Instructions (Signed)
You may return to driving an automobile as long as you are no longer requiring oral narcotic pain relievers during the daytime.  It would be wise to start driving only short distances during the daylight and gradually increase from there as you feel comfortable.   Endocarditis is a potentially serious infection of heart valves or inside lining of the heart.  It occurs more commonly in patients with diseased heart valves (such as patient's with aortic or mitral valve disease) and in patients who have undergone heart valve repair or replacement.  Certain surgical and dental procedures may put you at risk, such as dental cleaning, other dental procedures, or any surgery involving the respiratory, urinary, gastrointestinal tract, gallbladder or prostate gland.   To minimize your chances for develooping endocarditis, maintain good oral health and seek prompt medical attention for any infections involving the mouth, teeth, gums, skin or urinary tract.    Always notify your doctor or dentist about your underlying heart valve condition before having any invasive procedures. You will need to take antibiotics before certain procedures, including all routine dental cleanings or other dental procedures.  Your cardiologist or dentist should prescribe these antibiotics for you to be taken ahead of time.   You may continue to gradually increase your physical activity as tolerated.  Refrain from any heavy lifting or strenuous use of your arms and shoulders until at least 8 weeks from the time of your surgery, and avoid activities that cause increased pain in your chest on the side of your surgical incision.  Otherwise you may continue to increase activities without any particular limitations.  Increase the intensity and duration of physical activity gradually.  

## 2017-08-28 NOTE — Progress Notes (Signed)
HPI:  Patient returns for routine postoperative follow-up having undergone CABG x 3, AVR and Clipping of LA Appendage on 07/24/2017.  The patient's early postoperative recovery while in the hospital was unremarkable and the patient progressed without difficulty.  Since hospital discharge the patient reports he is doing extremely well.  He is very anxious to become more active.  He has resumed driving.  He is ambulating without any difficulty for as much as he wants.  He does ask if he still needs all the diuretic as he is 5 lbs lighter than he was before surgery and he doesn't have any swelling.  He denies pain.    Current Outpatient Medications  Medication Sig Dispense Refill  . acetaminophen (TYLENOL) 500 MG tablet Take 2 tablets (1,000 mg total) by mouth every 6 (six) hours as needed for mild pain or fever. 30 tablet 0  . allopurinol (ZYLOPRIM) 100 MG tablet Take 200 mg by mouth every morning.    Marland Kitchen aspirin EC 325 MG EC tablet Take 1 tablet (325 mg total) by mouth daily. 30 tablet 0  . atorvastatin (LIPITOR) 20 MG tablet Take 1 tablet (20 mg total) by mouth daily at 6 PM. 30 tablet 3  . carvedilol (COREG) 3.125 MG tablet Take 1 tablet (3.125 mg total) by mouth 2 (two) times daily with a meal. 60 tablet 3  . docusate sodium (COLACE) 100 MG capsule Take 1 capsule (100 mg total) by mouth 2 (two) times daily as needed for mild constipation. 30 capsule 3  . fexofenadine (ALLEGRA) 180 MG tablet Take 180 mg by mouth daily.    . tamsulosin (FLOMAX) 0.4 MG CAPS capsule Take 0.4 mg by mouth daily.     Marland Kitchen torsemide (DEMADEX) 20 MG tablet Take 1 tablet (20 mg total) by mouth 2 (two) times daily. 30 tablet 3  . traMADol (ULTRAM) 50 MG tablet Take 1 tablet (50 mg total) by mouth every 6 (six) hours as needed for moderate pain. (Patient not taking: Reported on 08/04/2017) 30 tablet 0   No current facility-administered medications for this visit.     Physical Exam:   BP 110/74 (BP Location: Left Arm, Patient  Position: Sitting, Cuff Size: Normal)   Pulse 87   Resp 16   Ht 6' (1.829 m)   Wt 160 lb 3.2 oz (72.7 kg)   SpO2 97% Comment: ON RA  BMI 21.73 kg/m   Gen: no apparent distress, looks great Heart: RRR Lungs: CTA bilaterally Abd; soft non-tender, non-distended Ext: no edema Incisions: healing without difficulty   Diagnostic Tests:  CXR: no pleural effusions/pneumothorax.. Atrial Clip and Aortic Valve prosthesis in place  A/P:  1. S/P CABG x3, AVR, Clipping of LA Appendage- doing very well, will decrease Demadex to 20 mg daily.. Instructed patient to speak further with Dr. Justus Memory about stopping medication completely  2. Activity- patient has resume most regular activity.  He was instructed to be careful riding his bike in the extreme heat and he should wait until the weather cools down before attempting. He was also instructed to start slowly and build his endurance level back up.  Endocarditis information given  3. RTC in 3 months with Dr. Bartholome Bill, PA-C [facility] [phone]

## 2017-08-30 ENCOUNTER — Telehealth (HOSPITAL_COMMUNITY): Payer: Self-pay

## 2017-08-30 NOTE — Telephone Encounter (Signed)
Called patient to see if he is interested in the Cardiac Rehab Program - patient stated since we are scheduling so far out he does not want to participate as he will be fully recovered by 09/14/17. Closed referral.

## 2017-09-12 ENCOUNTER — Telehealth (HOSPITAL_COMMUNITY): Payer: Self-pay

## 2017-09-12 NOTE — Telephone Encounter (Signed)
Patients insurance is active and benefits verified through [facility] - $20.00 co-pay, no deductible, out of pocket amount of $4,000/$2,113.99 has been met, no co-insurance, and no pre-authorization is required. Passport/referene [phone]  Referral passed to RN Navigator for review.

## 2017-09-12 NOTE — Telephone Encounter (Signed)
Re-opened referral. Received faxed from [facility] stating patient has decided he does want to participate in Cardiac Rehab. Insurance benefits and eligibility to be determined.

## 2017-09-14 ENCOUNTER — Telehealth (HOSPITAL_COMMUNITY): Payer: Self-pay

## 2017-09-14 NOTE — Telephone Encounter (Signed)
Attempted to call patient in regards to Cardiac Rehab - LM on VM 

## 2017-09-15 ENCOUNTER — Telehealth (HOSPITAL_COMMUNITY): Payer: Self-pay

## 2017-09-15 NOTE — Telephone Encounter (Signed)
Patient returned phone call in regards to Cardiac Rehab. Scheduled orientation on 10/31/17 at 7:30am. Patient will attend the 9:45am exc class. Went over insurance with patient and he verbalized understanding. Mailed packet.

## 2017-10-31 ENCOUNTER — Ambulatory Visit (HOSPITAL_COMMUNITY): Payer: Medicare Other

## 2017-11-06 ENCOUNTER — Ambulatory Visit (HOSPITAL_COMMUNITY): Payer: Medicare Other

## 2017-11-08 ENCOUNTER — Ambulatory Visit (HOSPITAL_COMMUNITY): Payer: Medicare Other

## 2017-11-10 ENCOUNTER — Ambulatory Visit (HOSPITAL_COMMUNITY): Payer: Medicare Other

## 2017-11-13 ENCOUNTER — Ambulatory Visit (HOSPITAL_COMMUNITY): Payer: Medicare Other

## 2017-11-15 ENCOUNTER — Ambulatory Visit (HOSPITAL_COMMUNITY): Payer: Medicare Other

## 2017-11-17 ENCOUNTER — Ambulatory Visit (HOSPITAL_COMMUNITY): Payer: Medicare Other

## 2017-11-20 ENCOUNTER — Ambulatory Visit (HOSPITAL_COMMUNITY): Payer: Medicare Other

## 2017-11-22 ENCOUNTER — Ambulatory Visit (HOSPITAL_COMMUNITY): Payer: Medicare Other

## 2017-11-24 ENCOUNTER — Ambulatory Visit (HOSPITAL_COMMUNITY): Payer: Medicare Other

## 2017-11-27 ENCOUNTER — Ambulatory Visit (HOSPITAL_COMMUNITY): Payer: Medicare Other

## 2017-11-29 ENCOUNTER — Ambulatory Visit (HOSPITAL_COMMUNITY): Payer: Medicare Other

## 2017-11-29 ENCOUNTER — Ambulatory Visit: Payer: Medicare Other | Admitting: Surgery

## 2017-11-29 ENCOUNTER — Other Ambulatory Visit: Payer: Self-pay

## 2017-11-29 ENCOUNTER — Encounter: Payer: Self-pay | Admitting: Surgery

## 2017-11-29 VITALS — BP 124/80 | HR 85 | Resp 16 | Ht 72.0 in | Wt 164.0 lb

## 2017-11-29 DIAGNOSIS — Z952 Presence of prosthetic heart valve: Secondary | ICD-10-CM

## 2017-11-29 DIAGNOSIS — Z9889 Other specified postprocedural states: Secondary | ICD-10-CM

## 2017-11-29 DIAGNOSIS — Z951 Presence of aortocoronary bypass graft: Secondary | ICD-10-CM | POA: Diagnosis not present

## 2017-11-29 NOTE — Progress Notes (Signed)
  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 The patient's early postoperative recovery while in the hospital was notable for an uncomplicated postoperative course.. Since hospital discharge the patient reports that he has continued to feel well.  He is back to full activity and rode his bicycle 45 minutes to get to the appointment today.  His stamina is normal and he has had no chest pain or shortness of breath.   Current Outpatient Medications  Medication Sig Dispense Refill  . acetaminophen (TYLENOL) 500 MG tablet Take 2 tablets (1,000 mg total) by mouth every 6 (six) hours as needed for mild pain or fever. 30 tablet 0  . allopurinol (ZYLOPRIM) 100 MG tablet Take 200 mg by mouth every morning.    Marland Kitchen aspirin EC 81 MG tablet Take 81 mg by mouth daily.    Marland Kitchen atorvastatin (LIPITOR) 20 MG tablet Take 1 tablet (20 mg total) by mouth daily at 6 PM. 30 tablet 3  . carvedilol (COREG) 3.125 MG tablet Take 1 tablet (3.125 mg total) by mouth 2 (two) times daily with a meal. 60 tablet 3  . fexofenadine (ALLEGRA) 180 MG tablet Take 180 mg by mouth daily.    . tamsulosin (FLOMAX) 0.4 MG CAPS capsule Take 0.4 mg by mouth daily.      No current facility-administered medications for this visit.     Physical Exam: BP 124/80 (BP Location: Right Arm, Patient Position: Sitting, Cuff Size: Normal)   Pulse 85   Resp 16   Ht 6' (1.829 m)   Wt 164 lb (74.4 kg)   SpO2 97% Comment: ON RA  BMI 22.24 kg/m  He looks well. Cardiac exam shows a regular rate and rhythm with normal heart sounds.  There is no murmur. Lungs are clear. The chest incision is well-healed and sternum is stable. There is no peripheral edema.  Diagnostic Tests: None today  Impression/Plan:  He is now 4 months following his surgery and is  doing well.  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  At the time of surgery TEE showed an ejection fraction of 30 to 35% with mild central mitral regurgitation and a structurally normal mitral valve.  He is continuing to follow-up with Dr. Virgina Jock and is to have a follow-up echo done in January 2020.      Gaye Pollack, MD [facility] [phone]

## 2017-12-01 ENCOUNTER — Ambulatory Visit (HOSPITAL_COMMUNITY): Payer: Medicare Other

## 2017-12-04 ENCOUNTER — Ambulatory Visit (HOSPITAL_COMMUNITY): Payer: Medicare Other

## 2017-12-06 ENCOUNTER — Ambulatory Visit (HOSPITAL_COMMUNITY): Payer: Medicare Other

## 2017-12-08 ENCOUNTER — Ambulatory Visit (HOSPITAL_COMMUNITY): Payer: Medicare Other

## 2017-12-11 ENCOUNTER — Ambulatory Visit (HOSPITAL_COMMUNITY): Payer: Medicare Other

## 2017-12-13 ENCOUNTER — Ambulatory Visit (HOSPITAL_COMMUNITY): Payer: Medicare Other

## 2017-12-15 ENCOUNTER — Ambulatory Visit (HOSPITAL_COMMUNITY): Payer: Medicare Other

## 2017-12-18 ENCOUNTER — Ambulatory Visit (HOSPITAL_COMMUNITY): Payer: Medicare Other

## 2017-12-20 ENCOUNTER — Ambulatory Visit (HOSPITAL_COMMUNITY): Payer: Medicare Other

## 2017-12-22 ENCOUNTER — Ambulatory Visit (HOSPITAL_COMMUNITY): Payer: Medicare Other

## 2017-12-25 ENCOUNTER — Ambulatory Visit (HOSPITAL_COMMUNITY): Payer: Medicare Other

## 2017-12-27 ENCOUNTER — Ambulatory Visit (HOSPITAL_COMMUNITY): Payer: Medicare Other

## 2017-12-29 ENCOUNTER — Ambulatory Visit (HOSPITAL_COMMUNITY): Payer: Medicare Other

## 2018-01-01 ENCOUNTER — Ambulatory Visit (HOSPITAL_COMMUNITY): Payer: Medicare Other

## 2018-01-03 ENCOUNTER — Ambulatory Visit (HOSPITAL_COMMUNITY): Payer: Medicare Other

## 2018-01-05 ENCOUNTER — Ambulatory Visit (HOSPITAL_COMMUNITY): Payer: Medicare Other

## 2018-01-08 ENCOUNTER — Ambulatory Visit (HOSPITAL_COMMUNITY): Payer: Medicare Other

## 2018-01-10 ENCOUNTER — Ambulatory Visit (HOSPITAL_COMMUNITY): Payer: Medicare Other

## 2018-01-12 ENCOUNTER — Ambulatory Visit (HOSPITAL_COMMUNITY): Payer: Medicare Other

## 2018-01-15 ENCOUNTER — Ambulatory Visit (HOSPITAL_COMMUNITY): Payer: Medicare Other

## 2018-01-17 ENCOUNTER — Ambulatory Visit (HOSPITAL_COMMUNITY): Payer: Medicare Other

## 2018-01-19 ENCOUNTER — Ambulatory Visit (HOSPITAL_COMMUNITY): Payer: Medicare Other

## 2018-01-22 ENCOUNTER — Ambulatory Visit (HOSPITAL_COMMUNITY): Payer: Medicare Other

## 2018-01-24 ENCOUNTER — Ambulatory Visit (HOSPITAL_COMMUNITY): Payer: Medicare Other

## 2018-01-26 ENCOUNTER — Ambulatory Visit (HOSPITAL_COMMUNITY): Payer: Medicare Other

## 2018-01-29 ENCOUNTER — Ambulatory Visit (HOSPITAL_COMMUNITY): Payer: Medicare Other

## 2018-02-02 ENCOUNTER — Ambulatory Visit (HOSPITAL_COMMUNITY): Payer: Medicare Other

## 2018-02-05 ENCOUNTER — Ambulatory Visit (HOSPITAL_COMMUNITY): Payer: Medicare Other

## 2018-03-28 ENCOUNTER — Other Ambulatory Visit (HOSPITAL_COMMUNITY): Payer: Self-pay | Admitting: Cardiology

## 2018-03-29 LAB — BASIC METABOLIC PANEL
BUN/Creatinine Ratio: 13 (ref 10–24)
BUN: 12 mg/dL (ref 8–27)
CO2: 24 mmol/L (ref 20–29)
Calcium: 9.7 mg/dL (ref 8.6–10.2)
Chloride: 100 mmol/L (ref 96–106)
Creatinine, Ser: 0.92 mg/dL (ref 0.76–1.27)
GFR calc Af Amer: 93 mL/min/{1.73_m2} (ref >59)
GFR calc non Af Amer: 81 mL/min/{1.73_m2} (ref >59)
Glucose: 91 mg/dL (ref 65–99)
Potassium: 5.1 mmol/L (ref 3.5–5.2)
Sodium: 138 mmol/L (ref 134–144)

## 2018-04-23 ENCOUNTER — Other Ambulatory Visit: Payer: Self-pay

## 2018-04-23 MED ORDER — ATORVASTATIN CALCIUM 20 MG PO TABS: 20.0000 mg | ORAL_TABLET | Freq: Every day | ORAL | 3 refills | Status: DC

## 2018-05-10 ENCOUNTER — Other Ambulatory Visit: Payer: Self-pay

## 2018-05-10 MED ORDER — ATORVASTATIN CALCIUM 20 MG PO TABS: 20.0000 mg | ORAL_TABLET | Freq: Every day | ORAL | 0 refills | Status: DC

## 2018-05-17 ENCOUNTER — Other Ambulatory Visit: Payer: Self-pay

## 2018-05-17 MED ORDER — CARVEDILOL 3.125 MG PO TABS: 3.1250 mg | ORAL_TABLET | Freq: Two times a day (BID) | ORAL | 0 refills | Status: DC

## 2018-06-04 ENCOUNTER — Encounter: Payer: Self-pay | Admitting: Cardiology

## 2018-06-06 ENCOUNTER — Ambulatory Visit (INDEPENDENT_AMBULATORY_CARE_PROVIDER_SITE_OTHER): Payer: Medicare Other | Admitting: Cardiology

## 2018-06-06 ENCOUNTER — Ambulatory Visit: Payer: Self-pay | Admitting: Cardiology

## 2018-06-06 ENCOUNTER — Encounter: Payer: Self-pay | Admitting: Cardiology

## 2018-06-06 ENCOUNTER — Other Ambulatory Visit: Payer: Self-pay

## 2018-06-06 VITALS — BP 104/66 | HR 78 | Ht 71.0 in | Wt 172.0 lb

## 2018-06-06 DIAGNOSIS — Z952 Presence of prosthetic heart valve: Secondary | ICD-10-CM | POA: Diagnosis not present

## 2018-06-06 DIAGNOSIS — E782 Mixed hyperlipidemia: Secondary | ICD-10-CM | POA: Diagnosis not present

## 2018-06-06 DIAGNOSIS — I25810 Atherosclerosis of coronary artery bypass graft(s) without angina pectoris: Secondary | ICD-10-CM

## 2018-06-06 MED ORDER — METOPROLOL SUCCINATE ER 25 MG PO TB24: 25.0000 mg | ORAL_TABLET | Freq: Every day | ORAL | 3 refills | Status: DC

## 2018-06-06 NOTE — Progress Notes (Signed)
Virtual Visit via Video Note   Subjective:   [name], male    DOB: 1940/12/15, 78 y.o.   MRN: [id number]   I connected with the patient on 06/06/18 by a video enabled telemedicine application and verified that I am speaking with the correct person using two identifiers.     I discussed the limitations of evaluation and management by telemedicine and the availability of in person appointments. The patient expressed understanding and agreed to proceed.   This visit type was conducted due to national recommendations for restrictions regarding the COVID-19 Pandemic (e.g. social distancing).  This format is felt to be most appropriate for this patient at this time.  All issues noted in this document were discussed and addressed.  No physical exam was performed (except for noted visual exam findings with Tele health visits).  The patient has consented to conduct a Tele health visit and understands insurance will be billed.     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  At last visit in 02/2018, his EF was improved to 45-50%. I felt the larger reason for the EF improvement was CABG + AVR rather than low dose Entresto. With the cost involved, he expressed that he would like to come off it. I felt that it was reasonable. I started him on losartan 25 mg daily.  He has been doing well since his last visit.  He has resumed his biking activity, as much as possible during the pandemic.  He denies any symptoms of chest pain, shortness of breath, presyncope or syncope.  His BMP again showed upper normal potassium, of 5.1.  Past Medical History:  Diagnosis Date  . Anemia   . 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 CAD  . CHF (congestive heart failure) ([place])   . Gout   .  Hepatitis 1964   hepatitis A   . HFrEF (heart failure with reduced ejection fraction) ([place])   . Hypertension   . Moderate mitral regurgitation   . Severe aortic stenosis      Past Surgical History:  Procedure Laterality Date  . AORTIC VALVE REPLACEMENT N/A 07/24/2017   Procedure: AORTIC VALVE REPLACEMENT (AVR) using 65mm Inspiris Aortic Valve;  Surgeon: Gaye Pollack, MD;  Location: [place] [facility];  Service: Open Heart Surgery;  Laterality: N/A;  . CLIPPING OF ATRIAL APPENDAGE N/A 07/24/2017   Procedure: CLIPPING OF ATRIAL APPENDAGE using a 69mm Atricure clip;  Surgeon: Gaye Pollack, MD;  Location: [place];  Service: Open Heart Surgery;  Laterality: N/A;  . CORONARY ARTERY BYPASS GRAFT N/A 07/24/2017   Procedure: CORONARY ARTERY BYPASS GRAFTING (CABG) times 3 using the left greater saphenous vein harvested endoscopically and left internal mammary artery.;  Surgeon: Gaye Pollack, MD;  Location[facility];  Service: Open Heart Surgery;  Laterality: N/A;  . EYE SURGERY Bilateral    lens replacements for cataracts  . HERNIA REPAIR    . INGUINAL HERNIA REPAIR Right 05/06/2015   Procedure: LAPAROSCOPIC RIGHT INGUINAL HERNIA WITH MESH;  Surgeon: Coralie Keens, MD;  Location: [facility];  Service: General;  Laterality: Right;  . INSERTION OF MESH Right 05/06/2015   Procedure: INSERTION OF MESH;  Surgeon: Coralie Keens, MD;  [facility];  Service: General;  Laterality: Right;  . RIGHT/LEFT HEART  CATH AND CORONARY ANGIOGRAPHY N/A 07/10/2017   Procedure: RIGHT/LEFT HEART CATH AND CORONARY ANGIOGRAPHY;  Surgeon: Nigel Mormon, MD;  Location: [place] [facility];  Service: Cardiovascular;  Laterality: N/A;  . TEE WITHOUT CARDIOVERSION N/A 07/11/2017   Procedure: TRANSESOPHAGEAL ECHOCARDIOGRAM (TEE);  Surgeon: Nigel Mormon, MD;  Location: [facility];  Service: Cardiovascular;  Laterality: N/A;  . TEE WITHOUT CARDIOVERSION N/A 07/24/2017   Procedure: TRANSESOPHAGEAL ECHOCARDIOGRAM (TEE);   Surgeon: Gaye Pollack, MD;  Location: [place];  Service: Open Heart Surgery;  Laterality: N/A;  . ULTRASOUND GUIDANCE FOR VASCULAR ACCESS  07/10/2017   Procedure: Ultrasound Guidance For Vascular Access;  Surgeon: Nigel Mormon, MD;  Location: [place] [facility];  Service: Cardiovascular;;     Social History   Socioeconomic History  . Marital status: Married    Spouse name: Not on file  . Number of children: 1  . Years of education: Not on file  . Highest education level: Not on file  Occupational History  . Not on file  Social Needs  . Financial resource strain: Not on file  . Food insecurity:    Worry: Not on file    Inability: Not on file  . Transportation needs:    Medical: Not on file    Non-medical: Not on file  Tobacco Use  . Smoking status: Former Smoker    Packs/day: 1.00    Types: Cigarettes  . Smokeless tobacco: Never Used  . Tobacco comment: quit 30-40 yrs ago  Substance and Sexual Activity  . Alcohol use: Not Currently  . Drug use: No  . Sexual activity: Not on file  Lifestyle  . Physical activity:    Days per week: Not on file    Minutes per session: Not on file  . Stress: Not on file  Relationships  . Social connections:    Talks on phone: Not on file    Gets together: Not on file    Attends religious service: Not on file    Active member of club or organization: Not on file    Attends meetings of clubs or organizations: Not on file    Relationship status: Not on file  . Intimate partner violence:    Fear of current or ex partner: Not on file    Emotionally abused: Not on file    Physically abused: Not on file    Forced sexual activity: Not on file  Other Topics Concern  . Not on file  Social History Narrative  . Not on file     Family History  Problem Relation Age of Onset  . Stroke Mother   . Heart attack Father      Current Outpatient Medications on File Prior to Visit  Medication Sig Dispense Refill  . acetaminophen  (TYLENOL) 500 MG tablet Take 2 tablets (1,000 mg total) by mouth every 6 (six) hours as needed for mild pain or fever. 30 tablet 0  . allopurinol (ZYLOPRIM) 100 MG tablet Take 200 mg by mouth every morning.    Marland Kitchen aspirin EC 81 MG tablet Take 81 mg by mouth daily.    Marland Kitchen atorvastatin (LIPITOR) 20 MG tablet Take 1 tablet (20 mg total) by mouth daily at 6 PM. 90 tablet 0  . fexofenadine (ALLEGRA) 180 MG tablet Take 180 mg by mouth daily.    Marland Kitchen losartan (COZAAR) 25 MG tablet Take 25 mg by mouth daily.    . psyllium (METAMUCIL) 58.6 % powder Take 1 packet by  mouth daily.    . tamsulosin (FLOMAX) 0.4 MG CAPS capsule Take 0.4 mg by mouth daily.      No current facility-administered medications on file prior to visit.     Cardiovascular studies:  Echocardiogram 02/20/2018: Left ventricle cavity is normal in size. Moderate concentric hypertrophy of the left ventricle. Normal diastolic filling pattern. Abnormal septal wall motion due to post-operative coronary artery bypass graft. Visual EF is 45-50%. Left atrial cavity is moderate to severely dilated.\ at 5.0 cm.  Bioprosthetic aortic valve with no regurgitation noted. No evidence of aortic valve stenosis. Peak PG of 12 and mean PG of 8 mm Hg. Mild to moderate mitral regurgitation. Mild tricuspid regurgitation. No evidence of pulmonary hypertension. Compared to the study done on 08/09/2017, EF appears to have slightly improved from prior 35-40%.  Moderate pulmonary hypertension no longer present.   EKG 09/22/2017: Sinus rhythm 79 bpm. First degree AV block. Incomplete RBBB. Occasional PVC  Cardiac surgery 07/24/2017: CABG (LIMA-LAD, SVG- dLCx, SVG-RCA) Aortic valve replacement 23 mm Edwards Inspiris Resilia pericardial valve LAA clipping  Vascular ultrasound 07/11/2017: Bilateral carotid mild stenosis 1-39% Normal bilateral ABI  Recent labs: Results for JOHNATAN, BASKETTE (MRN [id number]) as of 06/06/2018 12:24  Ref. Range 03/28/2018 27:78  BASIC  METABOLIC PANEL Unknown Rpt  Sodium Latest Ref Range: 134 - 144 mmol/L 138  Potassium Latest Ref Range: 3.5 - 5.2 mmol/L 5.1  Chloride Latest Ref Range: 96 - 106 mmol/L 100  CO2 Latest Ref Range: 20 - 29 mmol/L 24  Glucose Latest Ref Range: 65 - 99 mg/dL 91  BUN Latest Ref Range: 8 - 27 mg/dL 12  Creatinine Latest Ref Range: 0.76 - 1.27 mg/dL 0.92  Calcium Latest Ref Range: 8.6 - 10.2 mg/dL 9.7  BUN/Creatinine Ratio Latest Ref Range: 10 - 24  13  GFR, Est Non African American Latest Ref Range: >59 mL/min/1.73 81  GFR, Est African American Latest Ref Range: >59 mL/min/1.73 93    Results for FREEMAN, BORBA (MRN [id number]) as of 06/06/2018 12:24  Ref. Range 07/26/2017 04:15  WBC Latest Ref Range: 4.0 - 10.5 K/uL 9.2  RBC Latest Ref Range: 4.22 - 5.81 MIL/uL 2.89 (L)  Hemoglobin Latest Ref Range: 13.0 - 17.0 g/dL 8.9 (L)  HCT Latest Ref Range: 39.0 - 52.0 % 27.8 (L)  MCV Latest Ref Range: 78.0 - 100.0 fL 96.2  MCH Latest Ref Range: 26.0 - 34.0 pg 30.8  MCHC Latest Ref Range: 30.0 - 36.0 g/dL 32.0  RDW Latest Ref Range: 11.5 - 15.5 % 14.6  Platelets Latest Ref Range: 150 - 400 K/uL 134 (L)    Review of Systems  Constitution: Negative for decreased appetite, malaise/fatigue, weight gain and weight loss.  HENT: Negative for congestion.   Eyes: Negative for visual disturbance.  Cardiovascular: Negative for chest pain, dyspnea on exertion, leg swelling, palpitations and syncope.  Respiratory: Negative for shortness of breath.   Endocrine: Negative for cold intolerance.  Hematologic/Lymphatic: Does not bruise/bleed easily.  Skin: Negative for itching and rash.  Musculoskeletal: Negative for myalgias.  Gastrointestinal: Negative for abdominal pain, nausea and vomiting.  Genitourinary: Negative for dysuria.  Neurological: Negative for dizziness and weakness.  Psychiatric/Behavioral: The patient is not nervous/anxious.   All other systems reviewed and are negative.        Vitals:    06/06/18 1315  BP: 104/66  Pulse: 78   (Measured by the patient using a home BP monitor)   Observation/findings during video visit   Objective:  Physical Exam  Constitutional: He is oriented to person, place, and time. He appears well-developed and well-nourished. No distress.  Pulmonary/Chest: Effort normal.  Neurological: He is alert and oriented to person, place, and time.  Psychiatric: He has a normal mood and affect.  Nursing note and vitals reviewed.         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   CAD: S/p CABG. No angina symptoms. Continue ASA 81 mg.. Continue statin.  Stop losartan today due to elevated potassium.  Started metoprolol succinate 25 mg once daily.  S/p bioprosthetic AVR: Stable.  EF has improved to 45-50% several weeks after CABG and AVR, with no symptoms of heart failure.  I think he can safely say that he does not have heart failure at this point.  Follow-up lipid panel, BMP in 3 months followed by office visit.     Nigel Mormon, MD [facility] [place] Pager: [phone] Office: [phone] If no answer Cell [phone]

## 2018-06-29 ENCOUNTER — Other Ambulatory Visit: Payer: Self-pay

## 2018-06-29 DIAGNOSIS — I5023 Acute on chronic systolic (congestive) heart failure: Secondary | ICD-10-CM

## 2018-07-10 ENCOUNTER — Telehealth: Payer: Self-pay

## 2018-07-10 NOTE — Telephone Encounter (Signed)
Increase metoprolol succinate to 50 mg daily. Please set up VV in 3 weeks.  Thanks MJP

## 2018-07-10 NOTE — Telephone Encounter (Signed)
Pt called and says that his bp is not normal with the metoprolol and he thinks that you may need to make some changes It has been on average 140/84; the highest 977 systolic; He is concerned

## 2018-08-01 ENCOUNTER — Encounter: Payer: Self-pay | Admitting: Cardiology

## 2018-08-01 ENCOUNTER — Ambulatory Visit (INDEPENDENT_AMBULATORY_CARE_PROVIDER_SITE_OTHER): Payer: Medicare Other | Admitting: Cardiology

## 2018-08-01 ENCOUNTER — Other Ambulatory Visit: Payer: Self-pay

## 2018-08-01 VITALS — BP 143/81

## 2018-08-01 DIAGNOSIS — Z952 Presence of prosthetic heart valve: Secondary | ICD-10-CM | POA: Diagnosis not present

## 2018-08-01 DIAGNOSIS — I25810 Atherosclerosis of coronary artery bypass graft(s) without angina pectoris: Secondary | ICD-10-CM

## 2018-08-01 DIAGNOSIS — I10 Essential (primary) hypertension: Secondary | ICD-10-CM | POA: Diagnosis not present

## 2018-08-01 DIAGNOSIS — R5383 Other fatigue: Secondary | ICD-10-CM | POA: Insufficient documentation

## 2018-08-01 DIAGNOSIS — R531 Weakness: Secondary | ICD-10-CM | POA: Insufficient documentation

## 2018-08-01 MED ORDER — AMLODIPINE BESYLATE 5 MG PO TABS: 5.0000 mg | ORAL_TABLET | Freq: Every day | ORAL | 3 refills | Status: DC

## 2018-08-01 NOTE — Progress Notes (Signed)
Virtual Visit via Video Note   Subjective:   [name], male    DOB: 17-Apr-1940, 78 y.o.   MRN: [id number]   I connected with the patient on 08/01/18 by a video enabled telemedicine application and verified that I am speaking with the correct person using two identifiers.     I discussed the limitations of evaluation and management by telemedicine and the availability of in person appointments. The patient expressed understanding and agreed to proceed.   This visit type was conducted due to national recommendations for restrictions regarding the COVID-19 Pandemic (e.g. social distancing).  This format is felt to be most appropriate for this patient at this time.  All issues noted in this document were discussed and addressed.  No physical exam was performed (except for noted visual exam findings with Tele health visits).  The patient has consented to conduct a Tele health visit and understands insurance will be billed.     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  Since increasing metoprolol dose for blood pressure control, he has felt generalized fatigue and low energy levels.  He denies any chest pain or shortness of breath.  Also denies any leg swelling or other heart failure symptoms.  Past Medical History:  Diagnosis Date  . Anemia   . 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 CAD  . CHF (congestive heart failure) ([place])   . Gout   . Hepatitis 1964   hepatitis A   . HFrEF (heart failure with reduced ejection fraction) ([place])   . Hypertension   . Moderate mitral regurgitation   . Severe aortic stenosis      Past Surgical History:  Procedure Laterality Date  . AORTIC VALVE REPLACEMENT N/A 07/24/2017   Procedure: AORTIC VALVE REPLACEMENT (AVR) using 84mm  Inspiris Aortic Valve;  Surgeon: Gaye Pollack, MD;  Location: [place] [facility];  Service: Open Heart Surgery;  Laterality: N/A;  . CLIPPING OF ATRIAL APPENDAGE N/A 07/24/2017   Procedure: CLIPPING OF ATRIAL APPENDAGE using a 52mm Atricure clip;  Surgeon: Gaye Pollack, MD;  Location: [place];  Service: Open Heart Surgery;  Laterality: N/A;  . CORONARY ARTERY BYPASS GRAFT N/A 07/24/2017   Procedure: CORONARY ARTERY BYPASS GRAFTING (CABG) times 3 using the left greater saphenous vein harvested endoscopically and left internal mammary artery.;  Surgeon: Gaye Pollack, MD;  Location: [facility];  Service: Open Heart Surgery;  Laterality: N/A;  . EYE SURGERY Bilateral    lens replacements for cataracts  . HERNIA REPAIR    . INGUINAL HERNIA REPAIR Right 05/06/2015   Procedure: LAPAROSCOPIC RIGHT INGUINAL HERNIA WITH MESH;  Surgeon: Coralie Keens, MD;  [facility]: [facility];  Service: General;  Laterality: Right;  . INSERTION OF MESH Right 05/06/2015   Procedure: INSERTION OF MESH;  Surgeon: Coralie Keens, MD;  [facility];  Service: General;  Laterality: Right;  . RIGHT/LEFT HEART CATH AND CORONARY ANGIOGRAPHY N/A 07/10/2017   Procedure: RIGHT/LEFT HEART CATH AND CORONARY ANGIOGRAPHY;  Surgeon: Nigel Mormon, MD;  Location: [place] [facility];  Service: Cardiovascular;  Laterality: N/A;  . TEE WITHOUT CARDIOVERSION N/A 07/11/2017   Procedure: TRANSESOPHAGEAL ECHOCARDIOGRAM (TEE);  Surgeon: Nigel Mormon, MD;  Location: [facility];  Service: Cardiovascular;  Laterality: N/A;  . TEE WITHOUT CARDIOVERSION  N/A 07/24/2017   Procedure: TRANSESOPHAGEAL ECHOCARDIOGRAM (TEE);  Surgeon: Gaye Pollack, MD;  Location: [place];  Service: Open Heart Surgery;  Laterality: N/A;  . ULTRASOUND GUIDANCE FOR VASCULAR ACCESS  07/10/2017   Procedure: Ultrasound Guidance For Vascular Access;  Surgeon: Nigel Mormon, MD;  Location: [place] [facility];  Service: Cardiovascular;;     Social History   Socioeconomic  History  . Marital status: Married    Spouse name: Not on file  . Number of children: 1  . Years of education: Not on file  . Highest education level: Not on file  Occupational History  . Not on file  Social Needs  . Financial resource strain: Not on file  . Food insecurity    Worry: Not on file    Inability: Not on file  . Transportation needs    Medical: Not on file    Non-medical: Not on file  Tobacco Use  . Smoking status: Former Smoker    Packs/day: 1.00    Types: Cigarettes  . Smokeless tobacco: Never Used  . Tobacco comment: quit 30-40 yrs ago  Substance and Sexual Activity  . Alcohol use: Yes    Comment: 1-2 drinks a night   . Drug use: No  . Sexual activity: Not on file  Lifestyle  . Physical activity    Days per week: Not on file    Minutes per session: Not on file  . Stress: Not on file  Relationships  . Social Herbalist on phone: Not on file    Gets together: Not on file    Attends religious service: Not on file    Active member of club or organization: Not on file    Attends meetings of clubs or organizations: Not on file    Relationship status: Not on file  . Intimate partner violence    Fear of current or ex partner: Not on file    Emotionally abused: Not on file    Physically abused: Not on file    Forced sexual activity: Not on file  Other Topics Concern  . Not on file  Social History Narrative  . Not on file     Family History  Problem Relation Age of Onset  . Stroke Mother   . Heart attack Father      Current Outpatient Medications on File Prior to Visit  Medication Sig Dispense Refill  . acetaminophen (TYLENOL) 500 MG tablet Take 2 tablets (1,000 mg total) by mouth every 6 (six) hours as needed for mild pain or fever. 30 tablet 0  . allopurinol (ZYLOPRIM) 100 MG tablet Take 200 mg by mouth every morning.    Marland Kitchen aspirin EC 81 MG tablet Take 81 mg by mouth daily.    Marland Kitchen atorvastatin (LIPITOR) 20 MG tablet Take 1 tablet (20 mg  total) by mouth daily at 6 PM. 90 tablet 0  . fexofenadine (ALLEGRA) 180 MG tablet Take 180 mg by mouth daily.    . metoprolol succinate (TOPROL XL) 25 MG 24 hr tablet Take 1 tablet (25 mg total) by mouth daily. 60 tablet 3  . psyllium (METAMUCIL) 58.6 % powder Take 1 packet by mouth daily.    . tamsulosin (FLOMAX) 0.4 MG CAPS capsule Take 0.4 mg by mouth daily.      No current facility-administered medications on file prior to visit.     Cardiovascular studies:  Echocardiogram 02/20/2018: Left ventricle cavity is normal in size. Moderate concentric hypertrophy  of the left ventricle. Normal diastolic filling pattern. Abnormal septal wall motion due to post-operative coronary artery bypass graft. Visual EF is 45-50%. Left atrial cavity is moderate to severely dilated at 5.0 cm.  Bioprosthetic aortic valve with no regurgitation noted. No evidence of aortic valve stenosis. Peak PG of 12 and mean PG of 8 mm Hg. Mild to moderate mitral regurgitation. Mild tricuspid regurgitation. No evidence of pulmonary hypertension. Compared to the study done on 08/09/2017, EF appears to have slightly improved from prior 35-40%.  Moderate pulmonary hypertension no longer present.   EKG 09/22/2017: Sinus rhythm 79 bpm. First degree AV block. Incomplete RBBB. Occasional PVC  Cardiac surgery 07/24/2017: CABG (LIMA-LAD, SVG- dLCx, SVG-RCA) Aortic valve replacement 23 mm Edwards Inspiris Resilia pericardial valve LAA clipping  Vascular ultrasound 07/11/2017: Bilateral carotid mild stenosis 1-39% Normal bilateral ABI  Recent labs: Results for RHYLEN, SHAHEEN (MRN [id number]) as of 06/06/2018 12:24  Ref. Range 03/28/2018 35:46  BASIC METABOLIC PANEL Unknown Rpt  Sodium Latest Ref Range: 134 - 144 mmol/L 138  Potassium Latest Ref Range: 3.5 - 5.2 mmol/L 5.1  Chloride Latest Ref Range: 96 - 106 mmol/L 100  CO2 Latest Ref Range: 20 - 29 mmol/L 24  Glucose Latest Ref Range: 65 - 99 mg/dL 91  BUN Latest Ref  Range: 8 - 27 mg/dL 12  Creatinine Latest Ref Range: 0.76 - 1.27 mg/dL 0.92  Calcium Latest Ref Range: 8.6 - 10.2 mg/dL 9.7  BUN/Creatinine Ratio Latest Ref Range: 10 - 24  13  GFR, Est Non African American Latest Ref Range: >59 mL/min/1.73 81  GFR, Est African American Latest Ref Range: >59 mL/min/1.73 93    Results for TRAMMELL, BOWDEN (MRN [id number]) as of 06/06/2018 12:24  Ref. Range 07/26/2017 04:15  WBC Latest Ref Range: 4.0 - 10.5 K/uL 9.2  RBC Latest Ref Range: 4.22 - 5.81 MIL/uL 2.89 (L)  Hemoglobin Latest Ref Range: 13.0 - 17.0 g/dL 8.9 (L)  HCT Latest Ref Range: 39.0 - 52.0 % 27.8 (L)  MCV Latest Ref Range: 78.0 - 100.0 fL 96.2  MCH Latest Ref Range: 26.0 - 34.0 pg 30.8  MCHC Latest Ref Range: 30.0 - 36.0 g/dL 32.0  RDW Latest Ref Range: 11.5 - 15.5 % 14.6  Platelets Latest Ref Range: 150 - 400 K/uL 134 (L)    Review of Systems  Constitution: Negative for decreased appetite, malaise/fatigue, weight gain and weight loss.  HENT: Negative for congestion.   Eyes: Negative for visual disturbance.  Cardiovascular: Negative for chest pain, dyspnea on exertion, leg swelling, palpitations and syncope.  Respiratory: Negative for shortness of breath.   Endocrine: Negative for cold intolerance.  Hematologic/Lymphatic: Does not bruise/bleed easily.  Skin: Negative for itching and rash.  Musculoskeletal: Negative for myalgias.  Gastrointestinal: Negative for abdominal pain, nausea and vomiting.  Genitourinary: Negative for dysuria.  Neurological: Negative for dizziness and weakness.  Psychiatric/Behavioral: The patient is not nervous/anxious.   All other systems reviewed and are negative.        Vitals:   08/01/18 1347  BP: (!) 143/81   (Measured by the patient using a home BP monitor)   Observation/findings during video visit   Objective:    Physical Exam  Constitutional: He is oriented to person, place, and time. He appears well-developed and well-nourished. No  distress.  Pulmonary/Chest: Effort normal.  Neurological: He is alert and oriented to person, place, and time.  Psychiatric: He has a normal mood and affect.  Nursing note and vitals reviewed.  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  Generalized fatigue: Possibly related to metoprolol.  Read below.  Hypertension: Likely intolerance to increased dose of metoprolol succinate.  Reduce metoprolol succinate to 25 mg daily.  Started amlodipine 5 mg daily.  CAD: S/p CABG. No angina symptoms. Continue ASA, statin.  S/p bioprosthetic AVR: Stable.  EF has improved to 45-50% several weeks after CABG and AVR, with no symptoms of heart failure.    F/u in 2 weeks.   Nigel Mormon, MD [facility] Pager: [phone] Office: [phone] If no answer Cell [phone]

## 2018-08-02 ENCOUNTER — Other Ambulatory Visit: Payer: Self-pay | Admitting: Cardiology

## 2018-08-15 ENCOUNTER — Encounter: Payer: Self-pay | Admitting: Cardiology

## 2018-08-15 ENCOUNTER — Other Ambulatory Visit: Payer: Self-pay

## 2018-08-15 ENCOUNTER — Ambulatory Visit (INDEPENDENT_AMBULATORY_CARE_PROVIDER_SITE_OTHER): Payer: Medicare Other | Admitting: Cardiology

## 2018-08-15 VITALS — BP 130/76 | HR 70

## 2018-08-15 DIAGNOSIS — Z952 Presence of prosthetic heart valve: Secondary | ICD-10-CM

## 2018-08-15 DIAGNOSIS — E782 Mixed hyperlipidemia: Secondary | ICD-10-CM

## 2018-08-15 DIAGNOSIS — I10 Essential (primary) hypertension: Secondary | ICD-10-CM

## 2018-08-15 MED ORDER — AMLODIPINE BESYLATE 5 MG PO TABS: 7.5000 mg | ORAL_TABLET | Freq: Every day | ORAL | 3 refills | Status: DC

## 2018-08-15 NOTE — Progress Notes (Signed)
   Telephone visit note  Subjective:   [name], male    DOB: 1940/04/13, 78 y.o.   MRN: [id number]   I connected with the patient on 08/15/18 by a telephone call and verified that I am speaking with the correct person using two identifiers.     I offered the patient a video enabled application for a virtual visit. Unfortunately, this could not be accomplished due to technical difficulties/lack of video enabled phone/computer. I discussed the limitations of evaluation and management by telemedicine and the availability of in person appointments. The patient expressed understanding and agreed to proceed.   This visit type was conducted due to national recommendations for restrictions regarding the COVID-19 Pandemic (e.g. social distancing).  This format is felt to be most appropriate for this patient at this time.  All issues noted in this document were discussed and addressed.  No physical exam was performed (except for noted visual exam findings with Tele health visits).  The patient has consented to conduct a Tele health visit and understands insurance will be billed.   Chief complaint:  Hypertension   HPI  78 year old Caucasian male with coronary artery disease and severe AS, treated with CABG X 3 (LIMA-LAD, SVG-dLCx, SVG-RCA), bioprosthetic AVR (23 mm Edwards Inspiris Resilia pericardial valve), LAA clipping in 08/2017.  At last visit, I had decreased his metoprolol succinate to 25 mg daily due to intolerance (fatigue). I started him on amlodipine 5 mg daily for BP control.  His fatigue symptoms have improved, and he would like to completely come off beta blocker.   Generalized fatigue: Possibly related to metoprolol.  Read below.  Hypertension: Stopped metoprolol due to intolerance. Increase amlodipine to 7.5 mg daily.  Hyperlipidemia: Upcoming lipid panel later this month. Continue lipitor 20 mg for now.   CAD: S/p CABG. No angina symptoms. Continue ASA, statin.  S/p  bioprosthetic AVR: Stable.  EF has improved to 45-50% several weeks after CABG and AVR, with no symptoms of heart failure.    F/u in 09/2018  Time spent: 12 min   Taylor, MD [facility]. [place] Pager: [phone] Office: [phone] If no answer Cell [phone]

## 2018-09-06 LAB — BASIC METABOLIC PANEL
BUN/Creatinine Ratio: 11 (ref 10–24)
BUN: 10 mg/dL (ref 8–27)
CO2: 23 mmol/L (ref 20–29)
Calcium: 9.6 mg/dL (ref 8.6–10.2)
Chloride: 100 mmol/L (ref 96–106)
Creatinine, Ser: 0.93 mg/dL (ref 0.76–1.27)
GFR calc Af Amer: 91 mL/min/{1.73_m2} (ref >59)
GFR calc non Af Amer: 79 mL/min/{1.73_m2} (ref >59)
Glucose: 93 mg/dL (ref 65–99)
Potassium: 4.6 mmol/L (ref 3.5–5.2)
Sodium: 138 mmol/L (ref 134–144)

## 2018-09-06 LAB — LIPID PANEL
Chol/HDL Ratio: 2.2 ratio (ref 0.0–5.0)
Cholesterol, Total: 139 mg/dL (ref 100–199)
HDL: 63 mg/dL (ref >39)
LDL Calculated: 66 mg/dL (ref 0–99)
Triglycerides: 48 mg/dL (ref 0–149)
VLDL Cholesterol Cal: 10 mg/dL (ref 5–40)

## 2018-09-19 ENCOUNTER — Telehealth (INDEPENDENT_AMBULATORY_CARE_PROVIDER_SITE_OTHER): Payer: Medicare Other | Admitting: Cardiology

## 2018-09-19 ENCOUNTER — Other Ambulatory Visit: Payer: Self-pay

## 2018-09-19 VITALS — BP 122/75 | HR 69

## 2018-09-19 DIAGNOSIS — Z952 Presence of prosthetic heart valve: Secondary | ICD-10-CM

## 2018-09-19 DIAGNOSIS — I10 Essential (primary) hypertension: Secondary | ICD-10-CM | POA: Diagnosis not present

## 2018-09-19 DIAGNOSIS — I25810 Atherosclerosis of coronary artery bypass graft(s) without angina pectoris: Secondary | ICD-10-CM

## 2018-09-19 MED ORDER — ASPIRIN EC 81 MG PO TBEC: 81.0000 mg | DELAYED_RELEASE_TABLET | Freq: Every day | ORAL | 3 refills | Status: DC

## 2018-09-19 MED ORDER — AMLODIPINE BESYLATE 5 MG PO TABS: 7.5000 mg | ORAL_TABLET | Freq: Every day | ORAL | 3 refills | Status: DC

## 2018-09-19 MED ORDER — ATORVASTATIN CALCIUM 20 MG PO TABS: 20.0000 mg | ORAL_TABLET | Freq: Every day | ORAL | 3 refills | Status: DC

## 2018-09-19 NOTE — Progress Notes (Signed)
Virtual Visit via Video Note   Subjective:   [name], male    DOB: 1940-08-04, 78 y.o.   MRN: [id number]   I connected with the patient on 09/19/18 by a video enabled telemedicine application and verified that I am speaking with the correct person using two identifiers.     I discussed the limitations of evaluation and management by telemedicine and the availability of in person appointments. The patient expressed understanding and agreed to proceed.   This visit type was conducted due to national recommendations for restrictions regarding the COVID-19 Pandemic (e.g. social distancing).  This format is felt to be most appropriate for this patient at this time.  All issues noted in this document were discussed and addressed.  No physical exam was performed (except for noted visual exam findings with Tele health visits).  The patient has consented to conduct a Tele health visit and understands insurance will be billed.     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  Recent bloodwork showed improvement in his lipid panel, LDL now at 66. He is doing very well and has not symptoms of angina, dyspnea with regular physical activity-including biking. His blood pressure is very well controlled without any side effects of leg edema.   Past Medical History:  Diagnosis Date  . Anemia   . 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 CAD  . CHF (congestive heart failure) ([place])   . Gout   . Hepatitis 1964   hepatitis A   . HFrEF (heart failure with reduced ejection fraction) ([place])   . Hypertension   . Moderate mitral regurgitation   . Severe aortic stenosis      Past Surgical History:  Procedure Laterality Date  . AORTIC VALVE REPLACEMENT N/A 07/24/2017   Procedure: AORTIC  VALVE REPLACEMENT (AVR) using 15mm Inspiris Aortic Valve;  Surgeon: Gaye Pollack, MD;  Location: [place] OR;  Service: Open Heart Surgery;  Laterality: N/A;  . CLIPPING OF ATRIAL APPENDAGE N/A 07/24/2017   Procedure: CLIPPING OF ATRIAL APPENDAGE using a 62mm Atricure clip;  Surgeon: Gaye Pollack, MD;  Location: [place];  Service: Open Heart Surgery;  Laterality: N/A;  . CORONARY ARTERY BYPASS GRAFT N/A 07/24/2017   Procedure: CORONARY ARTERY BYPASS GRAFTING (CABG) times 3 using the left greater saphenous vein harvested endoscopically and left internal mammary artery.;  Surgeon: Gaye Pollack, MD;  [facility];  Service: Open Heart Surgery;  Laterality: N/A;  . EYE SURGERY Bilateral    lens replacements for cataracts  . HERNIA REPAIR    . INGUINAL HERNIA REPAIR Right 05/06/2015   Procedure: LAPAROSCOPIC RIGHT INGUINAL HERNIA WITH MESH;  Surgeon: Coralie Keens, MD;  [facility];  Service: General;  Laterality: Right;  . INSERTION OF MESH Right 05/06/2015   Procedure: INSERTION OF MESH;  Surgeon: Coralie Keens, MD;  [facility];  Service: General;  Laterality: Right;  . RIGHT/LEFT HEART CATH AND CORONARY ANGIOGRAPHY N/A 07/10/2017   Procedure: RIGHT/LEFT HEART CATH AND CORONARY ANGIOGRAPHY;  Surgeon: Nigel Mormon, MD;  Location: [place] CV LAB;  Service: Cardiovascular;  Laterality: N/A;  . TEE WITHOUT CARDIOVERSION N/A 07/11/2017   Procedure: TRANSESOPHAGEAL ECHOCARDIOGRAM (TEE);  Surgeon: Nigel Mormon, MD;  Location: [place];  Service: Cardiovascular;  Laterality:  N/A;  . TEE WITHOUT CARDIOVERSION N/A 07/24/2017   Procedure: TRANSESOPHAGEAL ECHOCARDIOGRAM (TEE);  Surgeon: Gaye Pollack, MD;  Location: [place];  Service: Open Heart Surgery;  Laterality: N/A;  . ULTRASOUND GUIDANCE FOR VASCULAR ACCESS  07/10/2017   Procedure: Ultrasound Guidance For Vascular Access;  Surgeon: Nigel Mormon, MD;  Location: [place] CV LAB;  Service: Cardiovascular;;      Social History   Socioeconomic History  . Marital status: Married    Spouse name: Not on file  . Number of children: 1  . Years of education: Not on file  . Highest education level: Not on file  Occupational History  . Not on file  Social Needs  . Financial resource strain: Not on file  . Food insecurity    Worry: Not on file    Inability: Not on file  . Transportation needs    Medical: Not on file    Non-medical: Not on file  Tobacco Use  . Smoking status: Former Smoker    Packs/day: 1.00    Types: Cigarettes  . Smokeless tobacco: Never Used  . Tobacco comment: quit 30-40 yrs ago  Substance and Sexual Activity  . Alcohol use: Yes    Comment: 1-2 drinks a night   . Drug use: No  . Sexual activity: Not on file  Lifestyle  . Physical activity    Days per week: Not on file    Minutes per session: Not on file  . Stress: Not on file  Relationships  . Social Herbalist on phone: Not on file    Gets together: Not on file    Attends religious service: Not on file    Active member of club or organization: Not on file    Attends meetings of clubs or organizations: Not on file    Relationship status: Not on file  . Intimate partner violence    Fear of current or ex partner: Not on file    Emotionally abused: Not on file    Physically abused: Not on file    Forced sexual activity: Not on file  Other Topics Concern  . Not on file  Social History Narrative  . Not on file     Family History  Problem Relation Age of Onset  . Stroke Mother   . Heart attack Father      Current Outpatient Medications on File Prior to Visit  Medication Sig Dispense Refill  . acetaminophen (TYLENOL) 500 MG tablet Take 2 tablets (1,000 mg total) by mouth every 6 (six) hours as needed for mild pain or fever. 30 tablet 0  . allopurinol (ZYLOPRIM) 100 MG tablet Take 200 mg by mouth every morning.    Marland Kitchen amLODipine (NORVASC) 5 MG tablet Take 1.5 tablets (7.5 mg total) by mouth daily.  120 tablet 3  . aspirin EC 81 MG tablet Take 81 mg by mouth daily.    Marland Kitchen atorvastatin (LIPITOR) 20 MG tablet TAKE 1 TABLET (20 MG TOTAL) BY MOUTH DAILY AT 6 PM. 90 tablet 0  . fexofenadine (ALLEGRA) 180 MG tablet Take 180 mg by mouth daily.    . psyllium (METAMUCIL) 58.6 % powder Take 1 packet by mouth daily.    . tamsulosin (FLOMAX) 0.4 MG CAPS capsule Take 0.4 mg by mouth daily.      No current facility-administered medications on file prior to visit.     Cardiovascular studies:  Echocardiogram 02/20/2018: Left ventricle cavity is normal in size. Moderate  concentric hypertrophy of the left ventricle. Normal diastolic filling pattern. Abnormal septal wall motion due to post-operative coronary artery bypass graft. Visual EF is 45-50%. Left atrial cavity is moderate to severely dilated at 5.0 cm.  Bioprosthetic aortic valve with no regurgitation noted. No evidence of aortic valve stenosis. Peak PG of 12 and mean PG of 8 mm Hg. Mild to moderate mitral regurgitation. Mild tricuspid regurgitation. No evidence of pulmonary hypertension. Compared to the study done on 08/09/2017, EF appears to have slightly improved from prior 35-40%.  Moderate pulmonary hypertension no longer present.   EKG 09/22/2017: Sinus rhythm 79 bpm. First degree AV block. Incomplete RBBB. Occasional PVC  Cardiac surgery 07/24/2017: CABG (LIMA-LAD, SVG- dLCx, SVG-RCA) Aortic valve replacement 23 mm Edwards Inspiris Resilia pericardial valve LAA clipping  Vascular ultrasound 07/11/2017: Bilateral carotid mild stenosis 1-39% Normal bilateral ABI  Recent labs: Results for KYVON, HU (MRN [id number]) as of 09/19/2018 11:31  Ref. Range 09/05/2018 09:49  Sodium Latest Ref Range: 134 - 144 mmol/L 138  Potassium Latest Ref Range: 3.5 - 5.2 mmol/L 4.6  Chloride Latest Ref Range: 96 - 106 mmol/L 100  CO2 Latest Ref Range: 20 - 29 mmol/L 23  Glucose Latest Ref Range: 65 - 99 mg/dL 93  BUN Latest Ref Range: 8 - 27 mg/dL  10  Creatinine Latest Ref Range: 0.76 - 1.27 mg/dL 0.93  Calcium Latest Ref Range: 8.6 - 10.2 mg/dL 9.6  BUN/Creatinine Ratio Latest Ref Range: 10 - 24  11  GFR, Est Non African American Latest Ref Range: >59 mL/min/1.73 79  GFR, Est African American Latest Ref Range: >59 mL/min/1.73 91  Total CHOL/HDL Ratio Latest Ref Range: 0.0 - 5.0 ratio 2.2  Cholesterol, Total Latest Ref Range: 100 - 199 mg/dL 139  HDL Cholesterol Latest Ref Range: >39 mg/dL 63  LDL (calc) Latest Ref Range: 0 - 99 mg/dL 66  Triglycerides Latest Ref Range: 0 - 149 mg/dL 48  VLDL Cholesterol Cal Latest Ref Range: 5 - 40 mg/dL 10     Review of Systems  Constitution: Negative for decreased appetite, malaise/fatigue, weight gain and weight loss.  HENT: Negative for congestion.   Eyes: Negative for visual disturbance.  Cardiovascular: Negative for chest pain, dyspnea on exertion, leg swelling, palpitations and syncope.  Respiratory: Negative for shortness of breath.   Endocrine: Negative for cold intolerance.  Hematologic/Lymphatic: Does not bruise/bleed easily.  Skin: Negative for itching and rash.  Musculoskeletal: Negative for myalgias.  Gastrointestinal: Negative for abdominal pain, nausea and vomiting.  Genitourinary: Negative for dysuria.  Neurological: Negative for dizziness and weakness.  Psychiatric/Behavioral: The patient is not nervous/anxious.   All other systems reviewed and are negative.       Vitals:   09/19/18 1456  BP: 122/75  Pulse: 69   (Measured by the patient using a home BP monitor)   Observation/findings during video visit   Objective:    Physical Exam  Constitutional: He is oriented to person, place, and time. He appears well-developed and well-nourished. No distress.  Pulmonary/Chest: Effort normal.  Neurological: He is alert and oriented to person, place, and time.  Psychiatric: He has a normal mood and affect.  Nursing note and vitals reviewed.         Assessment &  Recommendations:   78 year old Caucasian male with coronary artery disease and severe AS, treated with CABG X 3 (LIMA-LAD, SVG-dLCx, SVG-RCA), bioprosthetic AVR (23 mm Edwards Inspiris Resilia pericardial valve), LAA clipping in 08/2017.  Hypertension: Well controlled on amlodipine 7.5  mg daily. Not on beta blockers due to intolerance (fatigue).  Hyperlipidemia: Well controlled. Continue lipitor 20 mg for now.   CAD: S/p CABG. No angina symptoms. Continue ASA, statin.  S/p bioprosthetic AVR: Stable.  EF has improved to 45-50% several weeks after CABG and AVR, with no symptoms of heart failure.    Follow up in 1 year.   Nigel Mormon, MD [facility] Cardiovascular. [place] Pager: [phone] Office: [phone] If no answer Cell [phone]

## 2018-10-23 ENCOUNTER — Other Ambulatory Visit: Payer: Self-pay | Admitting: Cardiology

## 2018-10-23 DIAGNOSIS — I10 Essential (primary) hypertension: Secondary | ICD-10-CM

## 2018-11-09 ENCOUNTER — Other Ambulatory Visit: Payer: Self-pay | Admitting: Cardiology

## 2018-11-09 MED ORDER — ATORVASTATIN CALCIUM 20 MG PO TABS: 20.0000 mg | ORAL_TABLET | Freq: Every day | ORAL | 3 refills | Status: DC

## 2019-01-18 ENCOUNTER — Encounter (HOSPITAL_COMMUNITY): Payer: Self-pay | Admitting: Emergency Medicine

## 2019-01-18 ENCOUNTER — Other Ambulatory Visit: Payer: Self-pay

## 2019-01-18 ENCOUNTER — Ambulatory Visit (HOSPITAL_COMMUNITY)
Admission: EM | Admit: 2019-01-18 | Discharge: 2019-01-18 | Disposition: A | Discharge status: Home / Self Care | Payer: Medicare Other | Source: Home / Self Care | Attending: Family Medicine | Admitting: Family Medicine

## 2019-01-18 ENCOUNTER — Emergency Department (HOSPITAL_COMMUNITY): Payer: Medicare Other

## 2019-01-18 ENCOUNTER — Emergency Department (HOSPITAL_COMMUNITY)
Admission: EM | Admit: 2019-01-18 | Discharge: 2019-01-18 | Disposition: A | Discharge status: Home / Self Care | Payer: Medicare Other | Attending: Emergency Medicine | Admitting: Emergency Medicine

## 2019-01-18 DIAGNOSIS — Z87891 Personal history of nicotine dependence: Secondary | ICD-10-CM | POA: Insufficient documentation

## 2019-01-18 DIAGNOSIS — I5023 Acute on chronic systolic (congestive) heart failure: Secondary | ICD-10-CM | POA: Insufficient documentation

## 2019-01-18 DIAGNOSIS — S81812A Laceration without foreign body, left lower leg, initial encounter: Secondary | ICD-10-CM | POA: Insufficient documentation

## 2019-01-18 DIAGNOSIS — Z7982 Long term (current) use of aspirin: Secondary | ICD-10-CM | POA: Diagnosis not present

## 2019-01-18 DIAGNOSIS — E782 Mixed hyperlipidemia: Secondary | ICD-10-CM | POA: Insufficient documentation

## 2019-01-18 DIAGNOSIS — Z951 Presence of aortocoronary bypass graft: Secondary | ICD-10-CM | POA: Insufficient documentation

## 2019-01-18 DIAGNOSIS — Y999 Unspecified external cause status: Secondary | ICD-10-CM | POA: Insufficient documentation

## 2019-01-18 DIAGNOSIS — R52 Pain, unspecified: Secondary | ICD-10-CM

## 2019-01-18 DIAGNOSIS — I110 Hypertensive heart disease with heart failure: Secondary | ICD-10-CM | POA: Insufficient documentation

## 2019-01-18 DIAGNOSIS — Y9355 Activity, bike riding: Secondary | ICD-10-CM | POA: Insufficient documentation

## 2019-01-18 DIAGNOSIS — Z79899 Other long term (current) drug therapy: Secondary | ICD-10-CM | POA: Diagnosis not present

## 2019-01-18 DIAGNOSIS — Z23 Encounter for immunization: Secondary | ICD-10-CM | POA: Diagnosis not present

## 2019-01-18 DIAGNOSIS — Y929 Unspecified place or not applicable: Secondary | ICD-10-CM | POA: Insufficient documentation

## 2019-01-18 DIAGNOSIS — Z20828 Contact with and (suspected) exposure to other viral communicable diseases: Secondary | ICD-10-CM | POA: Insufficient documentation

## 2019-01-18 LAB — CBC WITH DIFFERENTIAL/PLATELET
Abs Immature Granulocytes: 0.01 10*3/uL (ref 0.00–0.07)
Basophils Absolute: 0.1 10*3/uL (ref 0.0–0.1)
Basophils Relative: 1 %
Eosinophils Absolute: 0.2 10*3/uL (ref 0.0–0.5)
Eosinophils Relative: 5 %
HCT: 40.2 % (ref 39.0–52.0)
Hemoglobin: 13.2 g/dL (ref 13.0–17.0)
Immature Granulocytes: 0 %
Lymphocytes Relative: 20 %
Lymphs Abs: 0.8 10*3/uL (ref 0.7–4.0)
MCH: 32.8 pg (ref 26.0–34.0)
MCHC: 32.8 g/dL (ref 30.0–36.0)
MCV: 99.8 fL (ref 80.0–100.0)
Monocytes Absolute: 0.5 10*3/uL (ref 0.1–1.0)
Monocytes Relative: 12 %
Neutro Abs: 2.7 10*3/uL (ref 1.7–7.7)
Neutrophils Relative %: 62 %
Platelets: 154 10*3/uL (ref 150–400)
RBC: 4.03 MIL/uL — ABNORMAL LOW (ref 4.22–5.81)
RDW: 12.4 % (ref 11.5–15.5)
WBC: 4.3 10*3/uL (ref 4.0–10.5)
nRBC: 0 % (ref 0.0–0.2)

## 2019-01-18 LAB — BASIC METABOLIC PANEL
Anion gap: 9 (ref 5–15)
BUN: 14 mg/dL (ref 8–23)
CO2: 22 mmol/L (ref 22–32)
Calcium: 9.3 mg/dL (ref 8.9–10.3)
Chloride: 107 mmol/L (ref 98–111)
Creatinine, Ser: 1.04 mg/dL (ref 0.61–1.24)
GFR calc Af Amer: >60 mL/min (ref >60)
GFR calc non Af Amer: >60 mL/min (ref >60)
Glucose, Bld: 95 mg/dL (ref 70–99)
Potassium: 4.2 mmol/L (ref 3.5–5.1)
Sodium: 138 mmol/L (ref 135–145)

## 2019-01-18 LAB — RESPIRATORY PANEL BY RT PCR (FLU A&B, COVID)
Influenza A by PCR: NEGATIVE
Influenza B by PCR: NEGATIVE
SARS Coronavirus 2 by RT PCR: NEGATIVE

## 2019-01-18 MED ORDER — CEPHALEXIN 500 MG PO CAPS: 500.0000 mg | ORAL_CAPSULE | Freq: Three times a day (TID) | ORAL | 0 refills | Status: DC

## 2019-01-18 MED ORDER — TETANUS-DIPHTH-ACELL PERTUSSIS 5-2.5-18.5 LF-MCG/0.5 IM SUSP
0.5000 mL | Freq: Once | INTRAMUSCULAR | Status: AC
  Administered 2019-01-18: 13:00:00 0.5 mL via INTRAMUSCULAR
  Filled 2019-01-18: qty 0.5

## 2019-01-18 MED ORDER — LIDOCAINE HCL (PF) 1 % IJ SOLN
10.0000 mL | Freq: Once | INTRAMUSCULAR | Status: AC
  Administered 2019-01-18: 10 mL
  Filled 2019-01-18: qty 10

## 2019-01-18 MED ORDER — SODIUM CHLORIDE 0.9 % IV BOLUS
1000.0000 mL | Freq: Once | INTRAVENOUS | Status: AC
  Administered 2019-01-18: 1000 mL via INTRAVENOUS

## 2019-01-18 MED ORDER — LIDOCAINE HCL (PF) 1 % IJ SOLN
INTRAMUSCULAR | Status: AC
  Filled 2019-01-18: qty 5

## 2019-01-18 MED ORDER — CEFAZOLIN SODIUM-DEXTROSE 1-4 GM/50ML-% IV SOLN
1.0000 g | Freq: Once | INTRAVENOUS | Status: AC
  Administered 2019-01-18: 1 g via INTRAVENOUS
  Filled 2019-01-18: qty 50
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
## 2019-01-18 NOTE — ED Provider Notes (Signed)
LACERATION REPAIR Performed by: Alyse Low Authorized by: Alyse Low Consent: Verbal consent obtained. Risks and benefits: risks, benefits and alternatives were discussed Consent given by: patient Patient identity confirmed: provided demographic data Prepped and Draped in normal sterile fashion Wound explored  Laceration Location: left lower leg  Laceration Length: 21cm  No Foreign Bodies seen or palpated  Anesthesia: local infiltration  Local anesthetic: lidocaine 1%  Anesthetic total: 20  Irrigation method: syringe Amount of cleaning: standard  Skin closure:vicryl and prolene  Number of sutures:   Technique: 5 sutures deep subq. 22 sutures 3.0   Patient tolerance: Patient tolerated the procedure well with no immediate complications.   [name], [place] 01/18/19 1512    Jesse Manifold, MD 01/22/19 [phone]

## 2019-01-18 NOTE — ED Triage Notes (Signed)
Pt down from UC for a lac to the left lower leg , bleeding controlled with dsg , pt able to move leg and pulse is palpable

## 2019-01-18 NOTE — Discharge Instructions (Addendum)
Try to wear the splint for at least a couple days if you can. I don't want you doing any more activity than light walking after that. The sutures need to stay in a minimum of 14 days, but preferably 21 days. There is a lot of tension on the edges of the wound and there is going to be  a lot of skin movement in this area. This will place it at higher risk for opening back up if they are taken out prematurely. Clean the wound with mild soap and warm water. Cover with a loose bandage.

## 2019-01-18 NOTE — Progress Notes (Signed)
Orthopedic Tech Progress Note Patient Details:  [name] 1940-10-14 [id number]    Ortho Devices Type of Ortho Device: Crutches, Short leg splint Ortho Device/Splint Location: LLE Ortho Device/Splint Interventions: Application, Ordered   Post Interventions Patient Tolerated: Well Instructions Provided: Care of device, Adjustment of device   Janit Pagan 01/18/2019, 3:29 PM

## 2019-02-19 ENCOUNTER — Other Ambulatory Visit: Payer: Self-pay

## 2019-02-19 ENCOUNTER — Encounter (HOSPITAL_BASED_OUTPATIENT_CLINIC_OR_DEPARTMENT_OTHER): Payer: Medicare PPO | Admitting: Internal Medicine

## 2019-02-19 DIAGNOSIS — I2510 Atherosclerotic heart disease of native coronary artery without angina pectoris: Secondary | ICD-10-CM | POA: Diagnosis not present

## 2019-02-19 DIAGNOSIS — S81812D Laceration without foreign body, left lower leg, subsequent encounter: Secondary | ICD-10-CM | POA: Diagnosis not present

## 2019-02-19 DIAGNOSIS — L97822 Non-pressure chronic ulcer of other part of left lower leg with fat layer exposed: Secondary | ICD-10-CM | POA: Diagnosis present

## 2019-02-19 DIAGNOSIS — X58XXXD Exposure to other specified factors, subsequent encounter: Secondary | ICD-10-CM | POA: Insufficient documentation

## 2019-02-19 DIAGNOSIS — M109 Gout, unspecified: Secondary | ICD-10-CM | POA: Diagnosis not present

## 2019-02-19 DIAGNOSIS — B159 Hepatitis A without hepatic coma: Secondary | ICD-10-CM | POA: Diagnosis not present

## 2019-02-19 DIAGNOSIS — I5020 Unspecified systolic (congestive) heart failure: Secondary | ICD-10-CM | POA: Diagnosis not present

## 2019-02-19 DIAGNOSIS — Z951 Presence of aortocoronary bypass graft: Secondary | ICD-10-CM | POA: Insufficient documentation

## 2019-02-19 DIAGNOSIS — Z952 Presence of prosthetic heart valve: Secondary | ICD-10-CM | POA: Diagnosis not present

## 2019-02-19 DIAGNOSIS — I110 Hypertensive heart disease with heart failure: Secondary | ICD-10-CM | POA: Diagnosis not present
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
## 2019-02-19 NOTE — Progress Notes (Signed)
OCTAVIAN, TOMSON ([id number]) Visit Report for 02/19/2019 Abuse/Suicide Risk Screen Details Patient Name: Date of Service: [name], [name] 02/19/2019 2:45 PM Medical Record [id number] Patient Account Number: [id number] Date of Birth/Sex: Treating RN: [birth year] (79 y.o. Male) Baruch Gouty Primary Care Alle Difabio: Yaakov Guthrie Other Clinician: Referring Alee Gressman: Treating Chandler Swiderski/Extender:Robson, Elie Goody, Chevis Pretty in Treatment: 0 Abuse/Suicide Risk Screen Items Answer ABUSE RISK SCREEN: Has anyone close to you tried to hurt or harm you recentlyo No Do you feel uncomfortable with anyone in your familyo No Has anyone forced you do things that you didnt want to doo No Electronic Signature(s) Signed: 02/19/2019 6:12:59 PM By: Baruch Gouty RN, BSN Entered By: Baruch Gouty on 02/19/2019 16:23:11 -------------------------------------------------------------------------------- Activities of Daily Living Details Patient Name: Date of Service: [name], [name] 02/19/2019 2:45 PM Medical Record [place] Patient Account Number: [id number] Date of Birth/Sex: Treating RN: 06/17/1940 (78 y.o. Male) Baruch Gouty Primary Care Tawanda Schall: Yaakov Guthrie Other Clinician: Referring Zakira Ressel: Treating Timmi Devora/Extender:Robson, Elie Goody, Chevis Pretty in Treatment: 0 Activities of Daily Living Items Answer Activities of Daily Living (Please select one for each item) Drive Automobile Completely Able Take Medications Completely Able Use Telephone Completely [place] for Appearance Completely Able Use Toilet Completely [place] Completely Able Feed Self Completely Able Walk Completely Able Get In / Out Bed Completely Able Housework Completely Able Prepare Meals Completely Able Handle Money Completely Able Shop for Self Completely Able Electronic Signature(s) Signed: 02/19/2019 6:12:59 PM By: Baruch Gouty RN, BSN Entered  By: Baruch Gouty on 02/19/2019 16:23:31 -------------------------------------------------------------------------------- Education Screening Details Patient Name: Date of Service: [name], [name] 02/19/2019 2:45 PM Medical Record [id number] Patient Account Number: [id number] Date of Birth/Sex: Treating RN: [birth year] (79 y.o. Male) Baruch Gouty Primary Care Averi Cacioppo: Yaakov Guthrie Other Clinician: Referring Eliazer Hemphill: Treating Dejai Schubach/Extender:Robson, Elie Goody, Chevis Pretty in Treatment: 0 Primary Learner Assessed: Patient Learning Preferences/Education Level/Primary Language Learning Preference: Explanation, Demonstration, Printed Material Highest Education Level: College or Above Preferred Language: English Cognitive Barrier Language Barrier: No Translator Needed: No Memory Deficit: No Emotional Barrier: No Cultural/Religious Beliefs Affecting Medical Care: No Physical Barrier Impaired Vision: Yes Glasses Impaired Hearing: No Decreased Hand dexterity: No Knowledge/Comprehension Knowledge Level: High Comprehension Level: High Ability to understand written High instructions: Ability to understand verbal High instructions: Motivation Anxiety Level: Calm Cooperation: Cooperative Education Importance: Acknowledges Need Interest in Health Problems: Asks Questions Perception: Coherent Willingness to Engage in Self- High Management Activities: Readiness to Engage in Self- High Management Activities: Electronic Signature(s) Signed: 02/19/2019 6:12:59 PM By: Baruch Gouty RN, BSN Entered By: Baruch Gouty on 02/19/2019 16:25:00 -------------------------------------------------------------------------------- Fall Risk Assessment Details Patient Name: Date of Service: [name], [name] 02/19/2019 2:45 PM Medical Record [id number] Patient Account Number: [id number] Date of Birth/Sex: Treating RN: 1940-05-14 (79 y.o. Male) Baruch Gouty Primary  Care Shellene Sweigert: Yaakov Guthrie Other Clinician: Referring Alissah Redmon: Treating Jaxzen Vanhorn/Extender:Robson, Elie Goody, Chevis Pretty in Treatment: 0 Fall Risk Assessment Items Have you had 2 or more falls in the last 12 monthso 0 No Have you had any fall that resulted in injury in the last 12 monthso 0 No FALLS RISK SCREEN History of falling - immediate or within 3 months 0 No Secondary diagnosis (Do you have 2 or more medical diagnoseso) 0 No Ambulatory aid None/bed rest/wheelchair/nurse 0 Yes Crutches/cane/walker 0 No Furniture 0 No Intravenous therapy Access/Saline/Heparin Lock 0 No Weak (short steps with or without shuffle, stooped but able to lift head 0 No while walking, may seek support from furniture) Impaired (short steps with shuffle,  may have difficulty arising from chair, 0 No head down, impaired balance) Mental Status Oriented to own ability 0 Yes Overestimates or forgets limitations 0 No Risk Level: Low Risk Score: 0 Electronic Signature(s) Signed: 02/19/2019 6:12:59 PM By: Baruch Gouty RN, BSN Entered By: Baruch Gouty on 02/19/2019 16:25:18 -------------------------------------------------------------------------------- Foot Assessment Details Patient Name: Date of Service: [name], [name] 02/19/2019 2:45 PM Medical Record [id number] Patient Account Number: [id number] Date of Birth/Sex: Treating RN: 1940/02/19 (79 y.o. Male) Baruch Gouty Primary Care Dillen Belmontes: Yaakov Guthrie Other Clinician: Referring Andreanna Mikolajczak: Treating Kymiah Araiza/Extender:Robson, Elie Goody, Chevis Pretty in Treatment: 0 Foot Assessment Items Site Locations + = Sensation present, - = Sensation absent, C = Callus, U = Ulcer R = Redness, W = Warmth, M = Maceration, PU = Pre-ulcerative lesion F = Fissure, S = Swelling, D = Dryness Assessment Right: Left: Other Deformity: No No Prior Foot Ulcer: No No Prior Amputation: No No Charcot Joint: No No Ambulatory Status: Ambulatory  Without Help Gait: Steady Electronic Signature(s) Signed: 02/19/2019 6:12:59 PM By: Baruch Gouty RN, BSN Entered By: Baruch Gouty on 02/19/2019 16:27:42 -------------------------------------------------------------------------------- Nutrition Risk Screening Details Patient Name: Date of Service: [name], [name] 02/19/2019 2:45 PM Medical Record [place] Patient Account Number: [id number] Date of Birth/Sex: Treating RN: 13-Apr-1940 (78 y.o. Male) Baruch Gouty Primary Care Jashaun Penrose: Yaakov Guthrie Other Clinician: Referring Gaetan Spieker: Treating Ivanka Kirshner/Extender:Robson, Elie Goody, Chevis Pretty in Treatment: 0 Height (in): 71 Weight (lbs): 175 Body Mass Index (BMI): 24.4 Nutrition Risk Screening Items Score Screening NUTRITION RISK SCREEN: I have an illness or condition that made me change the kind and/or 0 No amount of food I eat I eat fewer than two meals per day 0 No I eat few fruits and vegetables, or milk products 0 No I have three or more drinks of beer, liquor or wine almost every day 0 No I have tooth or mouth problems that make it hard for me to eat 0 No I don't always have enough money to buy the food I need 0 No I eat alone most of the time 0 No I take three or more different prescribed or over-the-counter drugs a day 1 Yes 0 No Without wanting to, I have lost or gained 10 pounds in the last six months I am not always physically able to shop, cook and/or feed myself 0 No Nutrition Protocols Good Risk Protocol 0 No interventions needed Moderate Risk Protocol High Risk Proctocol Risk Level: Good Risk Score: 1 Electronic Signature(s) Signed: 02/19/2019 6:12:59 PM By: Baruch Gouty RN, BSN Entered By: Baruch Gouty on 02/19/2019 16:26:38

## 2019-02-22 ENCOUNTER — Encounter (HOSPITAL_BASED_OUTPATIENT_CLINIC_OR_DEPARTMENT_OTHER): Payer: Medicare PPO | Admitting: Internal Medicine

## 2019-02-22 ENCOUNTER — Other Ambulatory Visit: Payer: Self-pay

## 2019-02-22 DIAGNOSIS — S81812D Laceration without foreign body, left lower leg, subsequent encounter: Secondary | ICD-10-CM | POA: Diagnosis not present

## 2019-02-22 NOTE — Progress Notes (Signed)
BIRGER, SYLVAIN ([id number]) Visit Report for 02/19/2019 Allergy List Details Patient Name: Date of Service: [name], [name] 02/19/2019 2:45 PM Medical Record [id number] Patient Account Number: [id number] Date of Birth/Sex: Treating RN: [birth year] (79 y.o. [name] Primary Care [name]: [name] Other Clinician: Referring [name]: Treating [name]/Extender:[name], [name], [name] in Treatment: 0 Allergies Active Allergies Sulfa (Sulfonamide Antibiotics) Reaction: rash Severity: Moderate Allergy Notes Electronic Signature(s) Signed: 02/19/2019 6:12:59 PM By: Baruch Gouty RN, BSN Entered By: Baruch Gouty on 02/19/2019 16:13:57 -------------------------------------------------------------------------------- Arrival Information Details Patient Name: Date of Service: [name], [name] 02/19/2019 2:45 PM Medical Record [id number] Patient Account Number: [id number] Date of Birth/Sex: Treating RN: 08/29/1940 (79 y.o. [name] Primary Care [name]: [name] Other Clinician: Referring Oronde Hallenbeck: Treating [name]/Extender:[name], [name], [name] in Treatment: 0 Visit Information Patient Arrived: Ambulatory Arrival Time: 16:10 Accompanied By: self Transfer Assistance: None Patient Identification Verified: Yes Secondary Verification Process Completed: Yes Patient Requires Transmission-Based No Precautions: Patient Has Alerts: Yes Patient Alerts: left ABI N/C Electronic Signature(s) Signed: 02/19/2019 6:12:59 PM By: Baruch Gouty RN, BSN Entered By: Baruch Gouty on 02/19/2019 17:07:46 -------------------------------------------------------------------------------- Clinic Level of Care Assessment Details Patient Name: Date of Service: [name], [name] 02/19/2019 2:45 PM Medical Record [id number] Patient Account Number: [id number] Date of Birth/Sex: Treating RN: 1940/06/09 (78 y.o. [name]) [name] Primary Care [name]: [name] Other Clinician: Referring [name]: Treating [name]/Extender:[name], [name], [name] in Treatment: 0 Clinic Level of Care Assessment Items TOOL 1 Quantity Score X - Use when EandM and Procedure is performed on INITIAL visit 1 0 ASSESSMENTS - Nursing Assessment / Reassessment X - General Physical Exam (combine w/ comprehensive assessment (listed just below) 1 20 when performed on new pt. evals) X - Comprehensive Assessment (HX, ROS, Risk Assessments, Wounds Hx, etc.) 1 25 ASSESSMENTS - Wound and Skin Assessment / Reassessment []  - Dermatologic / Skin Assessment (not related to wound area) 0 ASSESSMENTS - Ostomy and/or Continence Assessment and Care []  - Incontinence Assessment and Management 0 []  - Ostomy Care Assessment and Management (repouching, etc.) 0 PROCESS - Coordination of Care X - Simple Patient / Family Education for ongoing care 1 15 []  - Complex (extensive) Patient / Family Education for ongoing care 0 X - Staff obtains Programmer, systems, Records, Test Results / Process Orders 1 10 []  - Staff telephones HHA, Nursing Homes / Clarify orders / etc 0 []  - Routine Transfer to another Facility (non-emergent condition) 0 []  - Routine Hospital Admission (non-emergent condition) 0 X - New Admissions / Biomedical engineer / Ordering NPWT, Apligraf, etc. 1 15 []  - Emergency Hospital Admission (emergent condition) 0 PROCESS - Special Needs []  - Pediatric / Minor Patient Management 0 []  - Isolation Patient Management 0 []  - Hearing / Language / Visual special needs 0 []  - Assessment of Community assistance (transportation, D/C planning, etc.) 0 []  - Additional assistance / Altered mentation 0 []  - Support Surface(s) Assessment (bed, cushion, seat, etc.) 0 INTERVENTIONS - Miscellaneous []  - External ear exam 0 []  - Patient Transfer (multiple staff / Civil Service fast streamer / Similar devices) 0 []  - Simple Staple / Suture removal (25 or  less) 0 []  - Complex Staple / Suture removal (26 or more) 0 []  - Hypo/Hyperglycemic Management (do not check if billed separately) 0 X - Ankle / Brachial Index (ABI) - do not check if billed separately 1 15 Has the patient been seen at the hospital within the last three years: Yes Total Score: 100 Level Of Care: New/Established - Level  3 Electronic Signature(s) Signed: 02/19/2019 6:18:05 PM By: Jesse Coria RN Entered By: [name] on 02/19/2019 17:14:46 -------------------------------------------------------------------------------- Compression Therapy Details Patient Name: Date of Service: [name], [name] 02/19/2019 2:45 PM Medical Record [id number] Patient Account Number: [id number] Date of Birth/Sex: Treating RN: January 01, 1941 (78 y.o. [name]) [name] Primary Care [name]: [name] Other Clinician: Referring [name]: Treating [name]/Extender:[name], [name], [name] in Treatment: 0 Compression Therapy Performed for Wound Wound #1 Left Lower Leg Assessment: Performed By: Clinician Jesse Coria, RN Compression Type: Three Layer Post Procedure Diagnosis Same as Pre-procedure Electronic Signature(s) Signed: 02/19/2019 6:18:05 PM By: Jesse Coria RN Entered By: [name] on 02/19/2019 17:17:03 -------------------------------------------------------------------------------- Encounter Discharge Information Details Patient Name: Date of Service: [name], [name] 02/19/2019 2:45 PM Medical Record [id number] Patient Account Number: [id number] Date of Birth/Sex: Treating RN: May 11, 1940 (79 y.o. [name] Primary Care Roshelle Traub: [name] Other Clinician: Referring [name]: Treating [name]/Extender:[name], [name], [name] in Treatment: 0 Encounter Discharge Information Items Post Procedure Vitals Discharge Condition: Stable Temperature (F): 98 Ambulatory Status: Ambulatory Pulse (bpm): 67 Discharge Destination:  Home Respiratory Rate (breaths/min): 18 Transportation: Private Auto Blood Pressure (mmHg): 156/85 Accompanied By: self Schedule Follow-up Appointment: Yes Clinical Summary of Care: Patient Declined Electronic Signature(s) Signed: 02/22/2019 5:57:08 PM By: Kela Millin Entered By: Kela Millin on 02/19/2019 18:08:40 -------------------------------------------------------------------------------- Lower Extremity Assessment Details Patient Name: Date of Service: [name], [name] 02/19/2019 2:45 PM Medical Record [id number] Patient Account Number: [id number] Date of Birth/Sex: Treating RN: 10/03/1940 (79 y.o. [name] Primary Care Jarrett Albor: [name] Other Clinician: Referring Eternity Dexter: Treating Cherie Lasalle/Extender:[name], [name], [name] in Treatment: 0 Edema Assessment Assessed: [Left: No] [Right: No] Edema: [Left: Ye] [Right: s] Calf Left: Right: Point of Measurement: cm From Medial Instep 33 cm cm Ankle Left: Right: Point of Measurement: cm From Medial Instep 24.5 cm cm Vascular Assessment Pulses: Dorsalis Pedis Palpable: [Left:Yes] Notes left DP and PT pulses noncompressible Electronic Signature(s) Signed: 02/19/2019 6:12:59 PM By: Baruch Gouty RN, BSN Entered By: Baruch Gouty on 02/19/2019 16:39:56 -------------------------------------------------------------------------------- Multi Wound Chart Details Patient Name: Date of Service: [name], [name] 02/19/2019 2:45 PM Medical Record [id number] Patient Account Number: [id number] Date of Birth/Sex: Treating RN: [birth year] (79 y.o. M) Primary Care Telitha Plath: [name] Other Clinician: Referring Rosslyn Pasion: Treating Macy Lingenfelter/Extender:[name], [name], [name] in Treatment: 0 Vital Signs Height(in): 23 Pulse(bpm): 63 Weight(lbs): 175 Blood Pressure(mmHg): 156/85 Body Mass Index(BMI): 24 Temperature(F): 98.0 Respiratory 18 Rate(breaths/min): Photos:  [1:No Photos] [N/A:N/A] Wound Location: [1:Left Lower Leg] [N/A:N/A] Wounding Event: [1:Trauma] [N/A:N/A] Primary Etiology: [1:Trauma, Other] [N/A:N/A] Secondary Etiology: [1:Infection - not elsewhere classified] [N/A:N/A] Comorbid History: [1:Cataracts, Congestive Heart N/A Failure, Coronary Artery Disease, Hypertension, Hepatitis A, Gout] Date Acquired: [1:01/18/2019] [N/A:N/A] Weeks of Treatment: [1:0] [N/A:N/A] Wound Status: [1:Open] [N/A:N/A] Measurements L x W x D 11x4x1 [N/A:N/A] (cm) Area (cm) : [1:34.558] [N/A:N/A] Volume (cm) : [1:34.558] [N/A:N/A] Starting Position 1 7 (o'clock): Ending Position 1 [1:10] (o'clock): Maximum Distance 1 [1:0.6] (cm): Undermining: [1:Yes] [N/A:N/A] Classification: [1:Full Thickness Without Exposed Support Structures] [N/A:N/A] Exudate Amount: [1:Large] [N/A:N/A] Exudate Type: [1:Serosanguineous] [N/A:N/A] Exudate Color: [1:red, brown] [N/A:N/A] Wound Margin: [1:Distinct, outline attached N/A] Granulation Amount: [1:Small (1-33%)] [N/A:N/A] Granulation Quality: [1:Red] [N/A:N/A] Necrotic Amount: [1:Large (67-100%)] [N/A:N/A] Necrotic Tissue: [1:Eschar, Adherent Slough N/A] Exposed Structures: [1:Fat Layer (Subcutaneous N/A Tissue) Exposed: Yes Fascia: No Tendon: No Muscle: No Joint: No Bone: No] Epithelialization: [1:Small (1-33%)] [N/A:N/A] Debridement: [1:Debridement - Excisional N/A] Pre-procedure [1:17:00] [N/A:N/A] Verification/Time Out Taken: Pain Control: [1:Lidocaine 5% topical ointment] [N/A:N/A] Tissue Debrided: [1:Subcutaneous, Slough] [  N/A:N/A] Level: [1:Skin/Subcutaneous Tissue] [N/A:N/A] Debridement Area (sq cm):44 [N/A:N/A] Instrument: [1:Curette] [N/A:N/A] Bleeding: [1:Moderate] [N/A:N/A] Hemostasis Achieved: [1:Pressure] [N/A:N/A] Procedural Pain: [1:0] [N/A:N/A] Post Procedural Pain: [1:0] [N/A:N/A] Debridement Treatment Procedure was tolerated [N/A:N/A] Response: [1:well] Post Debridement [1:11x4x1]  [N/A:N/A] Measurements L x W x D (cm) Post Debridement [1:34.558] [N/A:N/A] Volume: (cm) Procedures Performed: Compression Therapy [1:Debridement] [N/A:N/A] Treatment Notes Electronic Signature(s) Signed: 02/19/2019 6:16:53 PM By: Linton Ham MD Entered By: Linton Ham on 02/19/2019 17:43:43 -------------------------------------------------------------------------------- [place] Details Patient Name: Date of Service: [name], [name] 02/19/2019 2:45 PM Medical Record [id number] Patient Account Number: [id number] Date of Birth/Sex: Treating RN: 13-Feb-1940 (78 y.o. [name] Primary Care Denario Bagot: [name] Other Clinician: Referring Quintessa Simmerman: Treating Noemi Bellissimo/Extender:[name], [name], [name] in Treatment: 0 Active Inactive Wound/Skin Impairment Nursing Diagnoses: Knowledge deficit related to ulceration/compromised skin integrity Goals: Patient/caregiver will verbalize understanding of skin care regimen Date Initiated: 02/19/2019 Target Resolution Date: 03/15/2019 Goal Status: Active Ulcer/skin breakdown will have a volume reduction of 30% by week 4 Date Initiated: 02/19/2019 Target Resolution Date: 03/15/2019 Goal Status: Active Interventions: Assess patient/caregiver ability to obtain necessary supplies Assess patient/caregiver ability to perform ulcer/skin care regimen upon admission and as needed Assess ulceration(s) every visit Notes: Electronic Signature(s) Signed: 02/19/2019 6:18:05 PM By: Jesse Coria RN Entered By: [name] on 02/19/2019 17:13:54 -------------------------------------------------------------------------------- Pain Assessment Details Patient Name: Date of Service: [name], [name] 02/19/2019 2:45 PM Medical Record Number:[id number] Patient Account Number: [id number] Date of Birth/Sex: Treating RN: 02/17/1940 (79 y.o. [name] Primary Care Camrin Gearheart: [name] Other  Clinician: Referring Asberry Lascola: Treating Kean Gautreau/Extender:[name], [name], [name] in Treatment: 0 Active Problems Location of Pain Severity and Description of Pain Patient Has Paino No Site Locations Rate the pain. Current Pain Level: 0 Pain Management and Medication Current Pain Management: Electronic Signature(s) Signed: 02/19/2019 6:12:59 PM By: Baruch Gouty RN, BSN Entered By: Baruch Gouty on 02/19/2019 16:42:12 -------------------------------------------------------------------------------- Patient/Caregiver Education Details Patient Name: Date of Service: [name] 1/12/2021andnbsp2:45 PM Medical Record [phone] Patient Account Number: [id number] Date of Birth/Gender: 05-17-1940 (78 y.o. M) Treating RN: [name] Primary Care Physician: [name] Other Clinician: Referring Physician: Treating Physician/Extender:[name], [name], [name] in Treatment: 0 Education Assessment Education Provided To: Patient Education Topics Provided Wound/Skin Impairment: Methods: Explain/Verbal Responses: State content correctly Electronic Signature(s) Signed: 02/19/2019 6:18:05 PM By: Jesse Coria RN Entered By: [name] on 02/19/2019 17:14:04 -------------------------------------------------------------------------------- Wound Assessment Details Patient Name: Date of Service: [name], [name] 02/19/2019 2:45 PM Medical Record [id number] Patient Account Number: [id number] Date of Birth/Sex: Treating RN: 1940/10/06 (79 y.o. [name] Primary Care Reeder Brisby: [name] Other Clinician: Referring Jerimah Witucki: Treating Amarilys Lyles/Extender:[name], [name], [name] in Treatment: 0 Wound Status Wound Number: 1 Primary Trauma, Other Etiology: Wound Location: Left Lower Leg Secondary Infection - not elsewhere classified Wounding Event: Trauma Etiology: Date Acquired: 01/18/2019 Wound Open Weeks Of Treatment:  0 Status: Clustered Wound: No Comorbid Cataracts, Congestive Heart Failure, History: Coronary Artery Disease, Hypertension, Hepatitis A, Gout Photos Wound Measurements Length: (cm) 11 Width: (cm) 4 Depth: (cm) 1 Area: (cm) 34.558 Volume: (cm) 34.558 % Reduction in Area: 0% % Reduction in Volume: 0% Epithelialization: Small (1-33%) Tunneling: No Undermining: Yes Starting Position (o'clock): 7 Ending Position (o'clock): 10 Maximum Distance: (cm) 0.6 Wound Description Classification: Full Thickness Without Exposed Support Foul Odo Structures Slough/F Wound Distinct, outline attached Margin: Exudate Large Amount: Exudate Serosanguineous Type: Exudate red, brown Color: Wound Bed Granulation Amount: Small (1-33%) Granulation Quality: Red Fascia Ex Necrotic Amount: Large (67-100%)  Fat Layer Necrotic Quality: Eschar, Adherent Slough Tendon Ex Muscle Ex Joint Exp Bone Expo r After Cleansing: No ibrino Yes Exposed Structure posed: No (Subcutaneous Tissue) Exposed: Yes posed: No posed: No osed: No sed: No Electronic Signature(s) Signed: 02/21/2019 3:33:56 PM By: Mikeal Hawthorne EMT/HBOT Signed: 02/21/2019 6:38:38 PM By: Baruch Gouty RN, BSN Previous Signature: 02/19/2019 6:12:59 PM Version By: Baruch Gouty RN, BSN Entered By: Mikeal Hawthorne on 02/21/2019 09:54:02 -------------------------------------------------------------------------------- [place] Details Patient Name: Date of Service: CAYETANO, LEDER 02/19/2019 2:45 PM Medical Record [id number] Patient Account Number: [id number] Date of Birth/Sex: Treating RN: 03/08/1940 (79 y.o. [name] Primary Care Ngan Qualls: [name] Other Clinician: Referring Tiffiany Beadles: Treating Zakaria Sedor/Extender:[name], [name], [name] in Treatment: 0 Vital Signs Time Taken: 16:12 Temperature (F): 98.0 Height (in): 71 Pulse (bpm): 67 Source: Stated Respiratory Rate (breaths/min): 18 Weight  (lbs): 175 Blood Pressure (mmHg): 156/85 Source: Stated Reference Range: 80 - 120 mg / dl Body Mass Index (BMI): 24.4 Electronic Signature(s) Signed: 02/19/2019 6:12:59 PM By: Baruch Gouty RN, BSN Entered By: Baruch Gouty on 02/19/2019 16:13:18

## 2019-02-25 NOTE — Progress Notes (Signed)
RAYLAND, GOWDA ([id number]) Visit Report for 02/22/2019 SuperBill Details Patient Name: Date of Service: [name], [name] 02/22/2019 Medical Record [place] Patient Account Number: [id number] Date of Birth/Sex: Treating RN: [birth year] (79 y.o. [name] Primary Care Provider: Yaakov Guthrie Other Clinician: Referring Provider: Treating Provider/Extender:Dariya Gainer, Elie Goody, Chevis Pretty in Treatment: 0 Diagnosis Coding ICD-10 Codes Code Description [phone] Laceration without foreign body, left lower leg, subsequent encounter L97.822 Non-pressure chronic ulcer of other part of left lower leg with fat layer exposed Facility Procedures CPT4 Code Description Modifier Quantity [id number] (Facility Use Only) [phone] - [place] 1 Electronic Signature(s) Signed: 02/22/2019 6:20:19 PM By: Linton Ham MD Signed: 02/25/2019 6:02:32 PM By: Levan Hurst RN, BSN Entered By: Levan Hurst on 02/22/2019 10:23:46

## 2019-02-25 NOTE — Progress Notes (Signed)
PILOT, ENGLES ([id number]) Visit Report for 02/22/2019 Arrival Information Details Patient Name: Date of Service: [name], [name] 02/22/2019 9:45 AM Medical Record [id number] Patient Account Number: [id number] Date of Birth/Sex: Treating RN: 04-27-1940 (79 y.o. [name] Primary Care Kyasia Steuck: Yaakov Guthrie Other Clinician: Referring Latrail Pounders: Treating Raunak Antuna/Extender:Robson, Elie Goody, Chevis Pretty in Treatment: 0 Visit Information History Since Last Visit Added or deleted any medications: No Patient Arrived: Ambulatory Any new allergies or adverse reactions: No Arrival Time: 10:19 Had a fall or experienced change in No Accompanied By: alone activities of daily living that may affect Transfer Assistance: None risk of falls: Patient Identification Verified: Yes Signs or symptoms of abuse/neglect since last No Secondary Verification Process Yes visito Completed: Hospitalized since last visit: No Patient Requires Transmission-Based No Implantable device outside of the clinic excluding No Precautions: cellular tissue based products placed in the center Patient Has Alerts: Yes since last visit: Patient Alerts: left ABI Has Dressing in Place as Prescribed: Yes N/C Has Compression in Place as Prescribed: Yes Pain Present Now: No Electronic Signature(s) Signed: 02/25/2019 6:02:32 PM By: Levan Hurst RN, BSN Entered By: Levan Hurst on 02/22/2019 10:19:51 -------------------------------------------------------------------------------- Compression Therapy Details Patient Name: Date of Service: [name], [name] 02/22/2019 9:45 AM Medical Record [id number] Patient Account Number: [id number] Date of Birth/Sex: Treating RN: January 11, 1941 (79 y.o. [name] Primary Care Estle Sabella: Yaakov Guthrie Other Clinician: Referring Kiyanna Biegler: Treating Chanteria Haggard/Extender:Robson, Elie Goody, Chevis Pretty in Treatment: 0 Compression Therapy Performed for  Wound Wound #1 Left Lower Leg Assessment: Performed By: Clinician Levan Hurst, RN Compression Type: Three Hydrologist) Signed: 02/25/2019 6:02:32 PM By: Levan Hurst RN, BSN Entered By: Levan Hurst on 02/22/2019 10:21:51 -------------------------------------------------------------------------------- Encounter Discharge Information Details Patient Name: Date of Service: [name], [name] 02/22/2019 9:45 AM Medical Record [id number] Patient Account Number: [id number] Date of Birth/Sex: Treating RN: 08/25/1940 (79 y.o. [name] Primary Care Cabella Kimm: Yaakov Guthrie Other Clinician: Referring Janeya Deyo: Treating Lorma Heater/Extender:Robson, Elie Goody, Chevis Pretty in Treatment: 0 Encounter Discharge Information Items Discharge Condition: Stable Ambulatory Status: Ambulatory Discharge Destination: Home Transportation: Private Auto Accompanied By: alone Schedule Follow-up Appointment: Yes Clinical Summary of Care: Patient Declined Electronic Signature(s) Signed: 02/25/2019 6:02:32 PM By: Levan Hurst RN, BSN Entered By: Levan Hurst on 02/22/2019 10:23:39 -------------------------------------------------------------------------------- Wound Assessment Details Patient Name: Date of Service: [name], [name] 02/22/2019 9:45 AM Medical Record [id number] Patient Account Number: [id number] Date of Birth/Sex: Treating RN: [birth year] (79 y.o. [name] Primary Care Corderro Koloski: Yaakov Guthrie Other Clinician: Referring Angell Honse: Treating Tymia Streb/Extender:Robson, Elie Goody, Chevis Pretty in Treatment: 0 Wound Status Wound Number: 1 Primary Trauma, Other Etiology: Wound Location: Left Lower Leg Secondary Infection - not elsewhere classified Wounding Event: Trauma Etiology: Date Acquired: 01/18/2019 Wound Open Weeks Of Treatment: 0 Status: Clustered Wound: No Comorbid Cataracts, Congestive Heart Failure, History: Coronary  Artery Disease, Hypertension, Hepatitis A, Gout Wound Measurements Length: (cm) 11 % Reduct Width: (cm) 5 % Reduct Depth: (cm) 1 Epitheli Area: (cm) 43.197 Tunneli Volume: (cm) 43.197 Undermi Wound Description Full Thickness Without Exposed Support Foul Od Classification: Structures Slough/ Wound Distinct, outline attached Margin: Exudate Large Amount: Exudate Serosanguineous Type: Exudate red, brown Color: Wound Bed Granulation Amount: Small (1-33%) Granulation Quality: Red Fascia E Necrotic Amount: Large (67-100%) Fat Laye Necrotic Quality: Eschar, Adherent Slough Tendon E Muscle E Joint Ex Bone Exp or After Cleansing: No Fibrino Yes Exposed Structure xposed: No r (Subcutaneous Tissue) Exposed: Yes xposed: No xposed: No posed: No osed: No ion in Area: -25% ion in Volume: -25% alization: Small (  1-33%) ng: No ning: No Treatment Notes Wound #1 (Left Lower Leg) 1. Cleanse With Soap and water 2. Periwound Care Barrier cream Moisturizing lotion 3. Primary Dressing Applied Calcium Alginate Ag 4. Secondary Dressing ABD Pad Kerramax/Xtrasorb 6. Support Layer Applied 3 layer compression Water quality scientist) Signed: 02/25/2019 6:02:32 PM By: Levan Hurst RN, BSN Entered By: Levan Hurst on 02/22/2019 10:21:40 -------------------------------------------------------------------------------- [place] Details Patient Name: Date of Service: [name], [name] 02/22/2019 9:45 AM Medical Record [id number] Patient Account Number: [id number] Date of Birth/Sex: Treating RN: 16-Aug-1940 (79 y.o. [name] Primary Care Perkins Molina: Yaakov Guthrie Other Clinician: Referring Mykell Rawl: Treating Chrishawna Farina/Extender:Robson, Elie Goody, Chevis Pretty in Treatment: 0 Vital Signs Time Taken: 10:20 Temperature (F): 98.0 Height (in): 71 Pulse (bpm): 68 Weight (lbs): 175 Respiratory Rate (breaths/min): 18 Body Mass Index (BMI): 24.4 Blood Pressure  (mmHg): 131/68 Reference Range: 80 - 120 mg / dl Electronic Signature(s) Signed: 02/25/2019 6:02:32 PM By: Levan Hurst RN, BSN Entered By: Levan Hurst on 02/22/2019 10:20:43

## 2019-02-26 ENCOUNTER — Encounter (HOSPITAL_BASED_OUTPATIENT_CLINIC_OR_DEPARTMENT_OTHER): Payer: Medicare PPO | Attending: Internal Medicine | Admitting: Internal Medicine

## 2019-02-26 ENCOUNTER — Other Ambulatory Visit: Payer: Self-pay

## 2019-02-27 NOTE — Progress Notes (Addendum)
WATKINS, LACHAT ([id number]) Visit Report for 02/26/2019 Arrival Information Details Patient Name: Date of Service: [name], [name] 02/26/2019 8:45 AM Medical Record [id number] Patient Account Number: [id number] Date of Birth/Sex: Treating RN: 04/24/1940 (79 y.o. [name], Meta.Reding Primary Care Renlee Floor: Yaakov Guthrie Other Clinician: Referring Amabel Stmarie: Treating Carnetta Losada/Extender:Robson, Elie Goody, Chevis Pretty in Treatment: 1 Visit Information History Since Last Visit Added or deleted any medications: No Patient Arrived: Ambulatory Any new allergies or adverse reactions: No Arrival Time: 08:57 Had a fall or experienced change in No Accompanied By: self activities of daily living that may affect Transfer Assistance: None risk of falls: Patient Identification Verified: Yes Signs or symptoms of abuse/neglect since last No Secondary Verification Process Completed: Yes visito Patient Requires Transmission-Based No Hospitalized since last visit: No Precautions: Implantable device outside of the clinic excluding No Patient Has Alerts: Yes cellular tissue based products placed in the center Patient Alerts: left ABI since last visit: N/C Has Dressing in Place as Prescribed: Yes Has Compression in Place as Prescribed: Yes Pain Present Now: No Electronic Signature(s) Signed: 02/26/2019 5:39:51 PM By: Deon Pilling Entered By: Deon Pilling on 02/26/2019 08:58:16 -------------------------------------------------------------------------------- Compression Therapy Details Patient Name: Date of Service: [name], [name] 02/26/2019 8:45 AM Medical Record [place] Patient Account Number: [id number] Date of Birth/Sex: Treating RN: May 11, 1940 (78 y.o. [name] Primary Care Wreatha Sturgeon: Yaakov Guthrie Other Clinician: Referring Miller Edgington: Treating Avelina Mcclurkin/Extender:Robson, Elie Goody, Chevis Pretty in Treatment: 1 Compression Therapy Performed for Wound Wound #1 Left  Lower Leg Assessment: Performed By: Clinician [name], RN Compression Type: Three Layer Post Procedure Diagnosis Same as Pre-procedure Electronic Signature(s) Signed: 02/27/2019 5:42:11 PM By: [name] RN Entered By: [name] on 02/26/2019 09:54:15 -------------------------------------------------------------------------------- Encounter Discharge Information Details Patient Name: Date of Service: [name], [name] 02/26/2019 8:45 AM Medical Record [id number] Patient Account Number: [id number] Date of Birth/Sex: Treating RN: 1941/01/07 (79 y.o. [name] Primary Care Kristy Catoe: Yaakov Guthrie Other Clinician: Referring Innocence Schlotzhauer: Treating Daphney Hopke/Extender:Robson, Elie Goody, Chevis Pretty in Treatment: 1 Encounter Discharge Information Items Discharge Condition: Stable Ambulatory Status: Ambulatory Discharge Destination: Home Transportation: Private Auto Accompanied By: self Schedule Follow-up Appointment: Yes Clinical Summary of Care: Patient Declined Electronic Signature(s) Signed: 02/26/2019 5:45:51 PM By: Kela Millin Entered By: Kela Millin on 02/26/2019 10:10:54 -------------------------------------------------------------------------------- Lower Extremity Assessment Details Patient Name: Date of Service: [name], [name] 02/26/2019 8:45 AM Medical Record [id number] Patient Account Number: [id number] Date of Birth/Sex: Treating RN: 14-Apr-1940 (79 y.o. Hessie Diener Primary Care Merel Santoli: Yaakov Guthrie Other Clinician: Referring Sasuke Yaffe: Treating Vyctoria Dickman/Extender:Robson, Elie Goody, Chevis Pretty in Treatment: 1 Edema Assessment Assessed: [Left: Yes] [Right: No] Edema: [Left: Ye] [Right: s] Calf Left: Right: Point of Measurement: 32 cm From Medial Instep 31.5 cm cm Ankle Left: Right: Point of Measurement: 11 cm From Medial Instep 23 cm cm Vascular Assessment Pulses: Dorsalis Pedis Palpable:  [Left:Yes] Electronic Signature(s) Signed: 02/26/2019 5:39:51 PM By: Deon Pilling Entered By: Deon Pilling on 02/26/2019 09:02:51 -------------------------------------------------------------------------------- Multi Wound Chart Details Patient Name: Date of Service: [name], [name] 02/26/2019 8:45 AM Medical Record [id number] Patient Account Number: [id number] Date of Birth/Sex: Treating RN: 10-04-1940 (79 y.o. M) Primary Care Adalberto Metzgar: Yaakov Guthrie Other Clinician: Referring Gayle Collard: Treating Bonnye Halle/Extender:Robson, Elie Goody, Chevis Pretty in Treatment: 1 Vital Signs Height(in): 71 Pulse(bpm): 75 Weight(lbs): 175 Blood Pressure(mmHg): 128/63 Body Mass Index(BMI): 24 Temperature(F): 98.4 Respiratory 20 Rate(breaths/min): Photos: [1:No Photos] [N/A:N/A] Wound Location: [1:Left Lower Leg] [N/A:N/A] Wounding Event: [1:Trauma] [N/A:N/A] Primary Etiology: [1:Trauma, Other] [N/A:N/A] Secondary Etiology: [1:Infection - not elsewhere classified] [N/A:N/A] Comorbid History: [1:Cataracts,  Congestive Heart N/A Failure, Coronary Artery Disease, Hypertension, Hepatitis A, Gout] Date Acquired: [1:01/18/2019] [N/A:N/A] Weeks of Treatment: [1:1] [N/A:N/A] Wound Status: [1:Open] [N/A:N/A] Measurements L x W x D 10.2x3.2x1 [N/A:N/A] (cm) Area (cm) : [1:25.635] [N/A:N/A] Volume (cm) : [1:25.635] [N/A:N/A] % Reduction in Area: [1:25.80%] [N/A:N/A] % Reduction in Volume: [1:25.80%] [N/A:N/A] Position 1 (o'clock): [1:12] Maximum Distance 1 [1:2.2] (cm): Tunneling: [1:Yes] [N/A:N/A] Classification: [1:Full Thickness Without Exposed Support Structures] [N/A:N/A] Exudate Amount: [1:Large] [N/A:N/A] Exudate Type: [1:Serosanguineous] [N/A:N/A] Exudate Color: [1:red, brown] [N/A:N/A] Wound Margin: [1:Distinct, outline attached N/A] Granulation Amount: [1:Medium (34-66%)] [N/A:N/A] Granulation Quality: [1:Red] [N/A:N/A] Necrotic Amount: [1:Medium (34-66%)] [N/A:N/A] Exposed  Structures: [1:Fat Layer (Subcutaneous N/A Tissue) Exposed: Yes Fascia: No Tendon: No Muscle: No Joint: No Bone: No] Epithelialization: [1:Small (1-33%) Compression Therapy] [N/A:N/A N/A] Treatment Notes Electronic Signature(s) Signed: 02/26/2019 5:52:07 PM By: Linton Ham MD Entered By: Linton Ham on 02/26/2019 09:55:16 -------------------------------------------------------------------------------- Multi-Disciplinary Care Plan Details Patient Name: Date of Service: [name], [name] 02/26/2019 8:45 AM Medical Record [id number] Patient Account Number: [id number] Date of Birth/Sex: Treating RN: Jul 12, 1940 (78 y.o. [name] Primary Care Ashaya Raftery: Yaakov Guthrie Other Clinician: Referring Bryce Cheever: Treating Akoni Parton/Extender:Robson, Elie Goody, Chevis Pretty in Treatment: 1 Active Inactive Wound/Skin Impairment Nursing Diagnoses: Knowledge deficit related to ulceration/compromised skin integrity Goals: Patient/caregiver will verbalize understanding of skin care regimen Date Initiated: 02/19/2019 Target Resolution Date: 03/15/2019 Goal Status: Active Ulcer/skin breakdown will have a volume reduction of 30% by week 4 Date Initiated: 02/19/2019 Target Resolution Date: 03/15/2019 Goal Status: Active Interventions: Assess patient/caregiver ability to obtain necessary supplies Assess patient/caregiver ability to perform ulcer/skin care regimen upon admission and as needed Assess ulceration(s) every visit Notes: Electronic Signature(s) Signed: 02/27/2019 5:42:11 PM By: [name] RN Entered By: [name] on 02/26/2019 08:58:55 -------------------------------------------------------------------------------- Pain Assessment Details Patient Name: Date of Service: [name], [name] 02/26/2019 8:45 AM Medical Record [id number] Patient Account Number: [id number] Date of Birth/Sex: Treating RN: 1941-01-20 (79 y.o. Hessie Diener Primary Care Jorgina Binning: Yaakov Guthrie Other Clinician: Referring Joby Richart: Treating Bernis Stecher/Extender:Robson, Elie Goody, Chevis Pretty in Treatment: 1 Active Problems Location of Pain Severity and Description of Pain Patient Has Paino No Site Locations Rate the pain. Current Pain Level: 0 Pain Management and Medication Current Pain Management: Medication: No Cold Application: No Rest: No Massage: No Activity: No T.E.N.S.: No Heat Application: No Leg drop or elevation: No Is the Current Pain Management Adequate: Adequate How does your wound impact your activities of daily livingo Sleep: No Bathing: No Appetite: No Relationship With Others: No Bladder Continence: No Emotions: No Bowel Continence: No Work: No Toileting: No Drive: No Dressing: No Hobbies: No Electronic Signature(s) Signed: 02/26/2019 5:39:51 PM By: Deon Pilling Entered By: Deon Pilling on 02/26/2019 09:02:16 -------------------------------------------------------------------------------- Patient/Caregiver Education Details Patient Name: Date of Service: [name] 1/19/2021andnbsp8:45 AM Medical Record [id number] Patient Account Number: [id number] Date of Birth/Gender: 1940-07-24 (78 y.o. M) Treating RN: [name] Primary Care Physician: Yaakov Guthrie Other Clinician: Referring Physician: Treating Physician/Extender:Robson, Elie Goody, Chevis Pretty in Treatment: 1 Education Assessment Education Provided To: Patient Education Topics Provided Wound/Skin Impairment: Methods: Explain/Verbal Responses: State content correctly Electronic Signature(s) Signed: 02/27/2019 5:42:11 PM By: [name] RN Entered By: [name] on 02/26/2019 08:59:14 -------------------------------------------------------------------------------- Wound Assessment Details Patient Name: Date of Service: [name], [name] 02/26/2019 8:45 AM Medical Record [id number] Patient Account Number: [id number] Date of  Birth/Sex: Treating RN: [birth year] (79 y.o. Hessie Diener Primary Care Monisha Siebel: Yaakov Guthrie Other Clinician: Referring Elouise Divelbiss: Treating Felis Quillin/Extender:Robson, Elie Goody, Chevis Pretty in Treatment: 1 Wound  Status Wound Number: 1 Primary Trauma, Other Etiology: Wound Location: Left Lower Leg Secondary Infection - not elsewhere classified Wounding Event: Trauma Etiology: Date Acquired: 01/18/2019 Wound Open Weeks Of Treatment: 1 Status: Clustered Wound: No Comorbid Cataracts, Congestive Heart Failure, History: Coronary Artery Disease, Hypertension, Hepatitis A, Gout Photos Wound Measurements Length: (cm) 10.2 Width: (cm) 3.2 Depth: (cm) 1 Area: (cm) 25.635 Volume: (cm) 25.635 % Reduction in Area: 25.8% % Reduction in Volume: 25.8% Epithelialization: Small (1-33%) Tunneling: Yes Position (o'clock): 12 Maximum Distance: (cm) 2.2 Undermining: No Wound Description Classification: Full Thickness Without Exposed Support Foul Odo Structures Slough/F Wound Distinct, outline attached Margin: Exudate Large Amount: Exudate Serosanguineous Type: Exudate red, brown Color: Wound Bed Granulation Amount: Medium (34-66%) Granulation Quality: Red Fascia E Necrotic Amount: Medium (34-66%) Fat Laye Necrotic Quality: Adherent Slough Tendon E Muscle E Joint Ex Bone Exp r After Cleansing: No ibrino Yes Exposed Structure xposed: No r (Subcutaneous Tissue) Exposed: Yes xposed: No xposed: No posed: No osed: No Treatment Notes Wound #1 (Left Lower Leg) 1. Cleanse With Wound Cleanser Soap and water 2. Periwound Care Barrier cream Moisturizing lotion 3. Primary Dressing Applied Calcium Alginate Ag Iodoflex 4. Secondary Dressing ABD Pad Dry Gauze 6. Support Layer Applied 3 layer compression wrap Electronic Signature(s) Signed: 02/28/2019 4:35:12 PM By: Mikeal Hawthorne EMT/HBOT Signed: 02/28/2019 6:20:01 PM By: Deon Pilling Previous Signature:  02/26/2019 5:39:51 PM Version By: Deon Pilling Entered By: Mikeal Hawthorne on 02/28/2019 11:21:39 -------------------------------------------------------------------------------- Vitals Details Patient Name: Date of Service: [name], [name] 02/26/2019 8:45 AM Medical Record [id number] Patient Account Number: [id number] Date of Birth/Sex: Treating RN: Jun 08, 1940 (79 y.o. Hessie Diener Primary Care Shayon Trompeter: Yaakov Guthrie Other Clinician: Referring Becci Batty: Treating Abran Gavigan/Extender:Robson, Elie Goody, Chevis Pretty in Treatment: 1 Vital Signs Time Taken: 08:57 Temperature (F): 98.4 Height (in): 71 Pulse (bpm): 75 Weight (lbs): 175 Respiratory Rate (breaths/min): 20 Body Mass Index (BMI): 24.4 Blood Pressure (mmHg): 128/63 Reference Range: 80 - 120 mg / dl Electronic Signature(s) Signed: 02/26/2019 5:39:51 PM By: Deon Pilling Entered By: Deon Pilling on 02/26/2019 09:01:52
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
## 2019-03-05 ENCOUNTER — Encounter (HOSPITAL_BASED_OUTPATIENT_CLINIC_OR_DEPARTMENT_OTHER): Payer: Medicare PPO | Admitting: Internal Medicine

## 2019-03-05 ENCOUNTER — Other Ambulatory Visit: Payer: Self-pay

## 2019-03-05 DIAGNOSIS — S81812D Laceration without foreign body, left lower leg, subsequent encounter: Secondary | ICD-10-CM | POA: Diagnosis not present
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
## 2019-03-07 NOTE — Progress Notes (Signed)
OMARI, KINNARD (RN:[id number]) Visit Report for 03/05/2019 Arrival Information Details Patient Name: Date of Service: [name], [name] 03/05/2019 10:00 AM Medical Record [id number] Patient Account Number: [id number] Date of Birth/Sex: Treating RN: 08-26-1940 (79 y.o. [name], [name] Primary Care Azuree Minish: Yaakov Guthrie Other Clinician: Referring Alwilda Gilland: Treating Danon Lograsso/Extender:Robson, Elie Goody, Chevis Pretty in Treatment: 2 Visit Information History Since Last Visit Added or deleted any medications: No Patient Arrived: Ambulatory Any new allergies or adverse reactions: No Arrival Time: 10:56 Had a fall or experienced change in No Accompanied By: self activities of daily living that may affect Transfer Assistance: None risk of falls: Patient Identification Verified: Yes Signs or symptoms of abuse/neglect since last No Secondary Verification Process Completed: Yes visito Patient Requires Transmission-Based No Hospitalized since last visit: No Precautions: Implantable device outside of the clinic excluding No Patient Has Alerts: Yes cellular tissue based products placed in the center Patient Alerts: left ABI since last visit: N/C Has Dressing in Place as Prescribed: Yes Has Compression in Place as Prescribed: Yes Pain Present Now: No Electronic Signature(s) Signed: 03/05/2019 6:19:30 PM By: Baruch Gouty RN, BSN Entered By: Baruch Gouty on 03/05/2019 10:56:41 -------------------------------------------------------------------------------- Compression Therapy Details Patient Name: Date of Service: [name], [name] 03/05/2019 10:00 AM Medical Record [place] Patient Account Number: [id number] Date of Birth/Sex: Treating RN: [birth year] (79 y.o. [name] Primary Care Devoiry Corriher: Yaakov Guthrie Other Clinician: Referring Nettie Wyffels: Treating Emonni Depasquale/Extender:Robson, Elie Goody, Chevis Pretty in Treatment: 2 Compression Therapy Performed for  Wound Wound #1 Left Lower Leg Assessment: Performed By: Clinician [name], RN Compression Type: Three Layer Post Procedure Diagnosis Same as Pre-procedure Electronic Signature(s) Signed: 03/06/2019 7:19:07 AM By: [name] RN Entered By: [name] on 03/05/2019 11:28:49 -------------------------------------------------------------------------------- Encounter Discharge Information Details Patient Name: Date of Service: [name], [name] 03/05/2019 10:00 AM Medical Record Number:[id number] Patient Account Number: [id number] Date of Birth/Sex: Treating RN: 01-10-1941 (79 y.o. [name] Primary Care Niall Illes: Yaakov Guthrie Other Clinician: Referring Saraiah Bhat: Treating Rie Mcneil/Extender:Robson, Elie Goody, Chevis Pretty in Treatment: 2 Encounter Discharge Information Items Discharge Condition: Stable Ambulatory Status: Ambulatory Discharge Destination: Home Transportation: Private Auto Accompanied By: self Schedule Follow-up Appointment: Yes Clinical Summary of Care: Patient Declined Electronic Signature(s) Signed: 03/07/2019 7:44:20 AM By: Kela Millin Entered By: Kela Millin on 03/05/2019 11:42:33 -------------------------------------------------------------------------------- Lower Extremity Assessment Details Patient Name: Date of Service: [name], [name] 03/05/2019 10:00 AM Medical Record Number:[id number] Patient Account Number: [id number] Date of Birth/Sex: Treating RN: 07-03-1940 (79 y.o. [name] Primary Care Bion Todorov: Yaakov Guthrie Other Clinician: Referring Brady Plant: Treating Jaylynn Mcaleer/Extender:Robson, Elie Goody, Chevis Pretty in Treatment: 2 Edema Assessment Assessed: [Left: No] [Right: No] Edema: [Left: Ye] [Right: s] Calf Left: Right: Point of Measurement: 32 cm From Medial Instep 33.4 cm cm Ankle Left: Right: Point of Measurement: 11 cm From Medial Instep 23.6 cm cm Vascular Assessment Pulses: Dorsalis  Pedis Palpable: [Left:Yes] Electronic Signature(s) Signed: 03/05/2019 6:19:30 PM By: Baruch Gouty RN, BSN Entered By: Baruch Gouty on 03/05/2019 11:07:34 -------------------------------------------------------------------------------- Multi Wound Chart Details Patient Name: Date of Service: [name], [name] 03/05/2019 10:00 AM Medical Record [id number] Patient Account Number: [id number] Date of Birth/Sex: Treating RN: 1940-07-20 (79 y.o. M) M) Primary Care Shravan Salahuddin: Yaakov Guthrie Other Clinician: Referring Charrie Mcconnon: Treating Miasha Emmons/Extender:Robson, Elie Goody, Chevis Pretty in Treatment: 2 Vital Signs Height(in): 71 Pulse(bpm): 70 Weight(lbs): 175 Blood Pressure(mmHg): 139/64 Body Mass Index(BMI): 24 Temperature(F): 97.7 Respiratory 18 Rate(breaths/min): Photos: [1:No Photos] [N/A:N/A] Wound Location: [1:Left Lower Leg] [N/A:N/A] Wounding Event: [1:Trauma] [N/A:N/A] Primary Etiology: [1:Trauma, Other] [N/A:N/A] Secondary Etiology: [1:Infection - not elsewhere classified] [  N/A:N/A] Comorbid History: [1:Cataracts, Congestive Heart N/A Failure, Coronary Artery Disease, Hypertension, Hepatitis A, Gout] Date Acquired: [1:01/18/2019] [N/A:N/A] Weeks of Treatment: [1:2] [N/A:N/A] Wound Status: [1:Open] [N/A:N/A] Measurements L x W x D 9.9x3x1 [N/A:N/A] (cm) Area (cm) : [1:23.326] [N/A:N/A] Volume (cm) : [1:23.326] [N/A:N/A] % Reduction in Area: [1:32.50%] [N/A:N/A] % Reduction in Volume: [1:32.50%] [N/A:N/A] Position 1 (o'clock): [1:11] Maximum Distance 1 [1:1.8] (cm): Tunneling: [1:Yes] [N/A:N/A] Classification: [1:Full Thickness Without Exposed Support Structures] [N/A:N/A] Exudate Amount: [1:Large] [N/A:N/A] Exudate Type: [1:Serosanguineous] [N/A:N/A] Exudate Color: [1:red, brown] [N/A:N/A] Wound Margin: [1:Distinct, outline attached N/A] Granulation Amount: [1:Medium (34-66%)] [N/A:N/A] Granulation Quality: [1:Red, Hyper-granulation] [N/A:N/A] Necrotic  Amount: [1:Medium (34-66%)] [N/A:N/A] Exposed Structures: [1:Fat Layer (Subcutaneous N/A Tissue) Exposed: Yes Fascia: No Tendon: No Muscle: No Joint: No Bone: No] Epithelialization: [1:Small (1-33%)] [N/A:N/A] Procedures Performed: [1:Chemical Cauterization Compression Therapy] [N/A:N/A] Treatment Notes Electronic Signature(s) Signed: 03/05/2019 5:32:11 PM By: Linton Ham MD Entered By: Linton Ham on 03/05/2019 11:30:18 -------------------------------------------------------------------------------- Multi-Disciplinary Care Plan Details Patient Name: Date of Service: [name], [name] 03/05/2019 10:00 AM Medical Record Number:[id number] Patient Account Number: [id number] Date of Birth/Sex: Treating RN: Mar 28, 1940 (78 y.o. [name]) [name] Primary Care Duglas Heier: Yaakov Guthrie Other Clinician: Referring Jager Koska: Treating Roshaunda Starkey/Extender:Robson, Elie Goody, Chevis Pretty in Treatment: 2 Active Inactive Wound/Skin Impairment Nursing Diagnoses: Knowledge deficit related to ulceration/compromised skin integrity Goals: Patient/caregiver will verbalize understanding of skin care regimen Date Initiated: 02/19/2019 Target Resolution Date: 03/15/2019 Goal Status: Active Ulcer/skin breakdown will have a volume reduction of 30% by week 4 Date Initiated: 02/19/2019 Target Resolution Date: 03/15/2019 Goal Status: Active Interventions: Assess patient/caregiver ability to obtain necessary supplies Assess patient/caregiver ability to perform ulcer/skin care regimen upon admission and as needed Assess ulceration(s) every visit Notes: Electronic Signature(s) Signed: 03/06/2019 7:19:07 AM By: [name] RN Entered By: [name] on 03/05/2019 10:43:24 -------------------------------------------------------------------------------- Pain Assessment Details Patient Name: Date of Service: [name], [name] 03/05/2019 10:00 AM Medical Record Number:[id number] Patient Account Number:  [id number] Date of Birth/Sex: Treating RN: 1940-07-21 (80 y.o. [name] Primary Care Indiya Izquierdo: Yaakov Guthrie Other Clinician: Referring Creedon Danielski: Treating Geraldina Parrott/Extender:Robson, Elie Goody, Chevis Pretty in Treatment: 2 Active Problems Location of Pain Severity and Description of Pain Patient Has Paino No Site Locations Rate the pain. Current Pain Level: 0 Pain Management and Medication Current Pain Management: Electronic Signature(s) Signed: 03/05/2019 6:19:30 PM By: Baruch Gouty RN, BSN Entered By: Baruch Gouty on 03/05/2019 11:00:18 -------------------------------------------------------------------------------- Patient/Caregiver Education Details Patient Name: Date of Service: [name] 1/26/2021andnbsp10:00 AM Medical Record [id number] Patient Account Number: [id number] Date of Birth/Gender: [birth year] (78 y.o. M) Treating RN: [name] Primary Care Physician: Yaakov Guthrie Other Clinician: Referring Physician: Treating Physician/Extender:Robson, Elie Goody, Chevis Pretty in Treatment: 2 Education Assessment Education Provided To: Patient Education Topics Provided Wound/Skin Impairment: Methods: Explain/Verbal Responses: State content correctly Electronic Signature(s) Signed: 03/06/2019 7:19:07 AM By: [name] RN Entered By: [name] on 03/05/2019 10:43:47 -------------------------------------------------------------------------------- Wound Assessment Details Patient Name: Date of Service: [name], [name] 03/05/2019 10:00 AM Medical Record Number:[id number] Patient Account Number: [id number] Date of Birth/Sex: Treating RN: 21-Oct-1940 (79 y.o. M) Primary Care Quintina Hakeem: Yaakov Guthrie Other Clinician: Referring Cuahutemoc Attar: Treating Yolette Hastings/Extender:Robson, Elie Goody, Chevis Pretty in Treatment: 2 Wound Status Wound Number: 1 Primary Trauma, Other Etiology: Wound Location: Left Lower Leg Secondary Infection - not  elsewhere classified Wounding Event: Trauma Etiology: Date Acquired: 01/18/2019 Wound Open Weeks Of Treatment: 2 Status: Clustered Wound: No Comorbid Cataracts, Congestive Heart Failure, History: Coronary Artery Disease, Hypertension, Hepatitis A, Gout Photos Wound Measurements Length: (cm) 9.9 Width: (cm)  3 Depth: (cm) 1 Area: (cm) 23.326 Volume: (cm) 23.326 % Reduction in Area: 32.5% % Reduction in Volume: 32.5% Epithelialization: Small (1-33%) Tunneling: Yes Position (o'clock): 11 Maximum Distance: (cm) 1.8 Undermining: No Wound Description Full Thickness Without Exposed Support Foul Odo Classification: Structures Slough/F Wound Distinct, outline attached Margin: Exudate Large Amount: Exudate Serosanguineous Type: Exudate red, brown Color: Wound Bed Granulation Amount: Medium (34-66%) Granulation Quality: Red, Hyper-granulation Fascia E Necrotic Amount: Medium (34-66%) Fat Laye Necrotic Quality: Adherent Slough Tendon E Muscle E Joint Ex Bone Exp r After Cleansing: No ibrino Yes Exposed Structure xposed: No r (Subcutaneous Tissue) Exposed: Yes xposed: No xposed: No posed: No osed: No Treatment Notes Wound #1 (Left Lower Leg) 1. Cleanse With Wound Cleanser Soap and water 2. Periwound Care Moisturizing lotion 3. Primary Dressing Applied Hydrofera Blue 4. Secondary Dressing ABD Pad 6. Support Layer Applied 3 layer compression wrap Electronic Signature(s) Signed: 03/06/2019 4:33:34 PM By: Mikeal Hawthorne EMT/HBOT Previous Signature: 03/05/2019 6:19:30 PM Version By: Baruch Gouty RN, BSN Previous Signature: 03/05/2019 6:19:30 PM Version By: Baruch Gouty RN, BSN Entered By: Mikeal Hawthorne on 03/06/2019 11:20:56 -------------------------------------------------------------------------------- Vitals Details Patient Name: Date of Service: [name], YEBRA 03/05/2019 10:00 AM Medical Record Number:[id number] Patient Account Number:  [id number] Date of Birth/Sex: Treating RN: Jul 13, 1940 (79 y.o. [name] Primary Care Adreanne Yono: Yaakov Guthrie Other Clinician: Referring Jolissa Kapral: Treating Rindy Kollman/Extender:Robson, Elie Goody, Chevis Pretty in Treatment: 2 Vital Signs Time Taken: 10:58 Temperature (F): 97.7 Height (in): 71 Pulse (bpm): 64 Source: Stated Respiratory Rate (breaths/min): 18 Weight (lbs): 175 Blood Pressure (mmHg): 139/64 Source: Stated Reference Range: 80 - 120 mg / dl Body Mass Index (BMI): 24.4 Electronic Signature(s) Signed: 03/05/2019 6:19:30 PM By: Baruch Gouty RN, BSN Entered By: Baruch Gouty on 03/05/2019 11:00:11

## 2019-03-12 ENCOUNTER — Other Ambulatory Visit: Payer: Self-pay

## 2019-03-12 ENCOUNTER — Encounter (HOSPITAL_BASED_OUTPATIENT_CLINIC_OR_DEPARTMENT_OTHER): Payer: Medicare PPO | Admitting: Internal Medicine

## 2019-03-12 DIAGNOSIS — L97822 Non-pressure chronic ulcer of other part of left lower leg with fat layer exposed: Secondary | ICD-10-CM | POA: Diagnosis present
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
## 2019-03-18 ENCOUNTER — Encounter (HOSPITAL_BASED_OUTPATIENT_CLINIC_OR_DEPARTMENT_OTHER): Payer: Medicare PPO | Attending: Internal Medicine | Admitting: Internal Medicine

## 2019-03-18 ENCOUNTER — Other Ambulatory Visit: Payer: Self-pay

## 2019-03-18 DIAGNOSIS — L97822 Non-pressure chronic ulcer of other part of left lower leg with fat layer exposed: Secondary | ICD-10-CM | POA: Diagnosis not present

## 2019-03-18 NOTE — Progress Notes (Signed)
SUNDEEP, KOSKO ([id number]) Visit Report for 03/18/2019 Fall Risk Assessment Details Patient Name: Date of Service: [name], [name] 03/18/2019 8:15 AM Medical Record [id number] Patient Account Number: [id number] Date of Birth/Sex: Treating RN: [birth year] (79 y.o. [name] Primary Care Arilynn Blakeney: Yaakov Guthrie Other Clinician: Referring Jonika Critz: Treating Waynesha Rammel/Extender:Robson, Elie Goody, Chevis Pretty in Treatment: 3 Fall Risk Assessment Items Have you had 2 or more falls in the last 12 monthso 0 No Have you had any fall that resulted in injury in the last 12 monthso 0 No FALLS RISK SCREEN History of falling - immediate or within 3 months 25 Yes Secondary diagnosis (Do you have 2 or more medical diagnoseso) 0 No Ambulatory aid None/bed rest/wheelchair/nurse 0 No Crutches/cane/walker 15 Yes Furniture 0 No Intravenous therapy Access/Saline/Heparin Lock 0 No Weak (short steps with or without shuffle, stooped but able to lift head 10 Yes while walking, may seek support from furniture) Impaired (short steps with shuffle, may have difficulty arising from chair, 0 No head down, impaired balance) Mental Status Oriented to own ability 0 Yes Overestimates or forgets limitations 0 No Risk Level: Medium Risk Score: 50 Electronic Signature(s) Signed: 03/18/2019 5:38:07 PM By: Kela Millin Entered By: Kela Millin on 03/18/2019 08:31:12
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
## 2019-03-19 NOTE — Progress Notes (Signed)
THIERRY, RITTENBERRY ([id number]) Visit Report for 03/18/2019 Arrival Information Details Patient Name: Date of Service: [name], [name] 03/18/2019 8:15 AM Medical Record [id number] Patient Account Number: [id number] Date of Birth/Sex: Treating RN: 1940-07-12 (79 y.o. [name] Primary Care Nochum Fenter: Yaakov Guthrie Other Clinician: Referring Kathlen Sakurai: Treating Keniyah Gelinas/Extender:Robson, Elie Goody, Chevis Pretty in Treatment: 3 Visit Information History Since Last Visit Added or deleted any medications: No Patient Arrived: Walker Any new allergies or adverse reactions: No Arrival Time: 08:30 Had a fall or experienced change in Yes Accompanied By: self activities of daily living that may affect Transfer Assistance: None risk of falls: Patient Identification Verified: Yes Signs or symptoms of abuse/neglect since last No Secondary Verification Process Completed: Yes visito Patient Requires Transmission-Based No Hospitalized since last visit: No Precautions: Implantable device outside of the clinic excluding No Patient Has Alerts: Yes cellular tissue based products placed in the center Patient Alerts: left ABI since last visit: N/C Has Dressing in Place as Prescribed: Yes Has Compression in Place as Prescribed: Yes Pain Present Now: No Electronic Signature(s) Signed: 03/18/2019 5:38:07 PM By: Kela Millin Entered By: Kela Millin on 03/18/2019 08:30:25 -------------------------------------------------------------------------------- Compression Therapy Details Patient Name: Date of Service: [name], [name] 03/18/2019 8:15 AM Medical Record [id number] Patient Account Number: [id number] Date of Birth/Sex: Treating RN: Jan 07, 1941 (79 y.o. [name] Primary Care Dillion Stowers: Yaakov Guthrie Other Clinician: Referring Caeleb Batalla: Treating Baker Kogler/Extender:Robson, Elie Goody, Chevis Pretty in Treatment: 3 Compression Therapy Performed for Wound Wound  #1 Left Lower Leg Assessment: Performed By: Clinician Levan Hurst, RN Compression Type: Three Layer Post Procedure Diagnosis Same as Pre-procedure Electronic Signature(s) Signed: 03/19/2019 5:30:42 PM By: Levan Hurst RN, BSN Entered By: Levan Hurst on 03/18/2019 09:08:16 -------------------------------------------------------------------------------- Encounter Discharge Information Details Patient Name: Date of Service: [name], [name] 03/18/2019 8:15 AM Medical Record [id number] Patient Account Number: [id number] Date of Birth/Sex: Treating RN: [birth year] (79 y.o. Hessie Diener Primary Care Ahmir Bracken: Yaakov Guthrie Other Clinician: Referring Chemere Steffler: Treating Justinn Welter/Extender:Robson, Elie Goody, Chevis Pretty in Treatment: 3 Encounter Discharge Information Items Discharge Condition: Stable Ambulatory Status: Walker Discharge Destination: Home Transportation: Private Auto Accompanied By: self Schedule Follow-up Appointment: Yes Clinical Summary of Care: Electronic Signature(s) Signed: 03/18/2019 9:45:43 AM By: Deon Pilling Entered By: Deon Pilling on 03/18/2019 09:19:45 -------------------------------------------------------------------------------- Lower Extremity Assessment Details Patient Name: Date of Service: [name], [name] 03/18/2019 8:15 AM Medical Record [place] Patient Account Number: [id number] Date of Birth/Sex: Treating RN: [birth year] (79 y.o. [name] Primary Care Kyel Purk: Yaakov Guthrie Other Clinician: Referring Ayuub Penley: Treating Ota Ebersole/Extender:Robson, Elie Goody, Chevis Pretty in Treatment: 3 Edema Assessment Assessed: [Left: No] [Right: No] Edema: [Left: N] [Right: o] Calf Left: Right: Point of Measurement: 32 cm From Medial Instep 32 cm cm Ankle Left: Right: Point of Measurement: 11 cm From Medial Instep 23.5 cm cm Vascular Assessment Pulses: Dorsalis Pedis Palpable: [Left:Yes] Electronic  Signature(s) Signed: 03/18/2019 5:38:07 PM By: Kela Millin Entered By: Kela Millin on 03/18/2019 08:35:22 -------------------------------------------------------------------------------- Multi Wound Chart Details Patient Name: Date of Service: [name], [name] 03/18/2019 8:15 AM Medical Record [id number] Patient Account Number: [id number] Date of Birth/Sex: Treating RN: 06/10/1940 (79 y.o. M) Primary Care Dirck Butch: Yaakov Guthrie Other Clinician: Referring Dionte Blaustein: Treating Olsen Mccutchan/Extender:Robson, Elie Goody, Chevis Pretty in Treatment: 3 Vital Signs Height(in): 71 Pulse(bpm): 41 Weight(lbs): 175 Blood Pressure(mmHg): 129/62 Body Mass Index(BMI): 24 Temperature(F): 98 Respiratory 18 Rate(breaths/min): Photos: [1:No Photos] [N/A:N/A] Wound Location: [1:Left Lower Leg] [N/A:N/A] Wounding Event: [1:Trauma] [N/A:N/A] Primary Etiology: [1:Trauma, Other] [N/A:N/A] Secondary Etiology: [1:Infection - not elsewhere classified] [N/A:N/A] Comorbid History: [1:Cataracts, Congestive  Heart N/A Failure, Coronary Artery Disease, Hypertension, Hepatitis A, Gout] Date Acquired: [1:01/18/2019] [N/A:N/A] Weeks of Treatment: [1:3] [N/A:N/A] Wound Status: [1:Open] [N/A:N/A] Measurements L x W x D 7.1x2.3x0.5 [N/A:N/A] (cm) Area (cm) : [1:12.826] [N/A:N/A] Volume (cm) : [1:6.413] [N/A:N/A] % Reduction in Area: [1:62.90%] [N/A:N/A] % Reduction in Volume: [1:81.40%] [N/A:N/A] Classification: [1:Full Thickness Without Exposed Support Structures] [N/A:N/A] Exudate Amount: [1:Medium] [N/A:N/A] Exudate Type: [1:Serosanguineous] [N/A:N/A] Exudate Color: [1:red, brown] [N/A:N/A] Wound Margin: [1:Distinct, outline attached N/A] Granulation Amount: [1:Large (67-100%)] [N/A:N/A] Granulation Quality: [1:Red, Hyper-granulation] [N/A:N/A] Necrotic Amount: [1:Small (1-33%)] [N/A:N/A] Exposed Structures: [1:Fat Layer (Subcutaneous N/A Tissue) Exposed: Yes Fascia: No Tendon: No Muscle:  No Joint: No Bone: No] Epithelialization: [1:Small (1-33%) Compression Therapy] [N/A:N/A N/A] Treatment Notes Wound #1 (Left Lower Leg) 1. Cleanse With Wound Cleanser Soap and water 2. Periwound Care Barrier cream Moisturizing lotion 3. Primary Dressing Applied Hydrofera Blue 4. Secondary Dressing ABD Pad Dry Gauze 6. Support Layer Applied 3 layer compression wrap Notes hydrofera blue classic used. netting. Electronic Signature(s) Signed: 03/18/2019 6:01:37 PM By: Linton Ham MD Entered By: Linton Ham on 03/18/2019 09:38:42 -------------------------------------------------------------------------------- Multi-Disciplinary Care Plan Details Patient Name: Date of Service: [name], [name] 03/18/2019 8:15 AM Medical Record [id number] Patient Account Number: [id number] Date of Birth/Sex: Treating RN: [birth year] (79 y.o. [name] Primary Care Evelynn Hench: Yaakov Guthrie Other Clinician: Referring Lashonda Sonneborn: Treating Amarionna Arca/Extender:Robson, Elie Goody, Chevis Pretty in Treatment: 3 Active Inactive Wound/Skin Impairment Nursing Diagnoses: Knowledge deficit related to ulceration/compromised skin integrity Goals: Patient/caregiver will verbalize understanding of skin care regimen Date Initiated: 02/19/2019 Target Resolution Date: 03/22/2019 Goal Status: Active Ulcer/skin breakdown will have a volume reduction of 30% by week 4 Date Initiated: 02/19/2019 Target Resolution Date: 03/22/2019 Goal Status: Active Interventions: Assess patient/caregiver ability to obtain necessary supplies Assess patient/caregiver ability to perform ulcer/skin care regimen upon admission and as needed Assess ulceration(s) every visit Notes: Electronic Signature(s) Signed: 03/19/2019 5:30:42 PM By: Levan Hurst RN, BSN Entered By: Levan Hurst on 03/18/2019 09:07:37 -------------------------------------------------------------------------------- Pain Assessment Details Patient  Name: Date of Service: [name], [name] 03/18/2019 8:15 AM Medical Record [id number] Patient Account Number: [id number] Date of Birth/Sex: Treating RN: [birth year] (79 y.o. [name] Primary Care Lilyan Prete: Yaakov Guthrie Other Clinician: Referring Concepcion Kirkpatrick: Treating Karlei Waldo/Extender:Robson, Elie Goody, Chevis Pretty in Treatment: 3 Active Problems Location of Pain Severity and Description of Pain Patient Has Paino No Site Locations Pain Management and Medication Current Pain Management: Electronic Signature(s) Signed: 03/18/2019 5:38:07 PM By: Kela Millin Entered By: Kela Millin on 03/18/2019 08:31:42 -------------------------------------------------------------------------------- Patient/Caregiver Education Details Patient Name: Date of Service: [name] 2/8/2021andnbsp8:15 AM Medical Record [id number] Patient Account Number: [id number] Date of Birth/Gender: 1940/05/17 (78 y.o. M) Treating RN: Levan Hurst Primary Care Physician: Yaakov Guthrie Other Clinician: Referring Physician: Treating Physician/Extender:Robson, Elie Goody, Chevis Pretty in Treatment: 3 Education Assessment Education Provided To: Patient Education Topics Provided Wound/Skin Impairment: Methods: Explain/Verbal Responses: State content correctly Electronic Signature(s) Signed: 03/19/2019 5:30:42 PM By: Levan Hurst RN, BSN Entered By: Levan Hurst on 03/18/2019 09:08:02 -------------------------------------------------------------------------------- Wound Assessment Details Patient Name: Date of Service: [name], [name] 03/18/2019 8:15 AM Medical Record [id number] Patient Account Number: [id number] Date of Birth/Sex: Treating RN: January 22, 1941 (79 y.o. [name] Primary Care Zoran Yankee: Yaakov Guthrie Other Clinician: Referring Jahlia Omura: Treating Jolisa Intriago/Extender:Robson, Elie Goody, Chevis Pretty in Treatment: 3 Wound Status Wound Number: 1  Primary Trauma, Other Etiology: Wound Location: Left Lower Leg Secondary Infection - not elsewhere classified Wounding Event: Trauma Etiology: Date Acquired: 01/18/2019 Wound Open Weeks Of Treatment: 3 Status: Clustered Wound: No  Comorbid Cataracts, Congestive Heart Failure, History: Coronary Artery Disease, Hypertension, Hepatitis A, Gout Photos Wound Measurements Length: (cm) 7.1 % Reduct Width: (cm) 2.3 % Reduct Depth: (cm) 0.5 Epitheli Area: (cm) 12.826 Tunneli Volume: (cm) 6.413 Undermi Wound Description Full Thickness Without Exposed Support Foul Odo Classification: Structures Slough/F Wound Distinct, outline attached Margin: Exudate Medium Amount: Exudate Serosanguineous Type: Exudate red, brown Color: Wound Bed Granulation Amount: Large (67-100%) Granulation Quality: Red, Hyper-granulation Fascia E Necrotic Amount: Small (1-33%) Fat Laye Necrotic Quality: Adherent Slough Tendon E Muscle E Joint Ex Bone Exp r After Cleansing: No ibrino Yes Exposed Structure xposed: No r (Subcutaneous Tissue) Exposed: Yes xposed: No xposed: No posed: No osed: No ion in Area: 62.9% ion in Volume: 81.4% alization: Small (1-33%) ng: No ning: No Treatment Notes Wound #1 (Left Lower Leg) 1. Cleanse With Wound Cleanser Soap and water 2. Periwound Care Barrier cream Moisturizing lotion 3. Primary Dressing Applied Hydrofera Blue 4. Secondary Dressing ABD Pad Dry Gauze 6. Support Layer Applied 3 layer compression wrap Notes hydrofera blue classic used. netting. Electronic Signature(s) Signed: 03/19/2019 4:28:10 PM By: Mikeal Hawthorne EMT/HBOT Signed: 03/19/2019 5:30:42 PM By: Levan Hurst RN, BSN Entered By: Mikeal Hawthorne on 03/19/2019 13:27:57 -------------------------------------------------------------------------------- Vitals Details Patient Name: Date of Service: [name], [name] 03/18/2019 8:15 AM Medical Record [id number] Patient Account  Number: [id number] Date of Birth/Sex: Treating RN: 04-10-1940 (79 y.o. [name] Primary Care Gregori Abril: Yaakov Guthrie Other Clinician: Referring Brysen Shankman: Treating Aziza Stuckert/Extender:Robson, Elie Goody, Chevis Pretty in Treatment: 3 Vital Signs Time Taken: 08:30 Temperature (F): 98 Height (in): 71 Pulse (bpm): 59 Weight (lbs): 175 Respiratory Rate (breaths/min): 18 Body Mass Index (BMI): 24.4 Blood Pressure (mmHg): 129/62 Reference Range: 80 - 120 mg / dl Electronic Signature(s) Signed: 03/18/2019 5:38:07 PM By: Kela Millin Entered By: Kela Millin on 03/18/2019 08:31:36

## 2019-03-25 ENCOUNTER — Encounter (HOSPITAL_BASED_OUTPATIENT_CLINIC_OR_DEPARTMENT_OTHER): Payer: Medicare PPO | Admitting: Internal Medicine

## 2019-03-26 ENCOUNTER — Other Ambulatory Visit: Payer: Self-pay

## 2019-03-26 ENCOUNTER — Encounter (HOSPITAL_BASED_OUTPATIENT_CLINIC_OR_DEPARTMENT_OTHER): Payer: Medicare PPO | Admitting: Internal Medicine

## 2019-03-26 DIAGNOSIS — L97822 Non-pressure chronic ulcer of other part of left lower leg with fat layer exposed: Secondary | ICD-10-CM | POA: Diagnosis not present
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
## 2019-04-02 ENCOUNTER — Other Ambulatory Visit: Payer: Self-pay

## 2019-04-02 ENCOUNTER — Encounter (HOSPITAL_BASED_OUTPATIENT_CLINIC_OR_DEPARTMENT_OTHER): Payer: Medicare PPO | Admitting: Internal Medicine

## 2019-04-02 DIAGNOSIS — L97822 Non-pressure chronic ulcer of other part of left lower leg with fat layer exposed: Secondary | ICD-10-CM | POA: Diagnosis not present
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
## 2019-04-04 NOTE — Progress Notes (Signed)
OTNIEL, PHENG (RN:[id number]) Visit Report for 03/12/2019 Arrival Information Details Patient Name: Date of Service: [name], [name] 03/12/2019 9:15 AM Medical Record [id number] Patient Account Number: [id number] Date of Birth/Sex: Treating RN: 17-Jun-1940 (79 y.o. [name], [name] Primary Care Avery Klingbeil: Yaakov Guthrie Other Clinician: Referring Druanne Bosques: Treating Dazhane Villagomez/Extender:Robson, Elie Goody, Chevis Pretty in Treatment: 3 Visit Information History Since Last Visit Added or deleted any medications: No Patient Arrived: Ambulatory Any new allergies or adverse reactions: No Arrival Time: 09:59 Had a fall or experienced change in No Accompanied By: self activities of daily living that may affect Transfer Assistance: None risk of falls: Patient Identification Verified: Yes Signs or symptoms of abuse/neglect since last No Secondary Verification Process Completed: Yes visito Patient Requires Transmission-Based No Hospitalized since last visit: No Precautions: Implantable device outside of the clinic excluding No Patient Has Alerts: Yes cellular tissue based products placed in the center Patient Alerts: left ABI since last visit: N/C Has Dressing in Place as Prescribed: Yes Has Compression in Place as Prescribed: Yes Pain Present Now: No Electronic Signature(s) Signed: 03/12/2019 5:43:30 PM By: Baruch Gouty RN, BSN Entered By: Baruch Gouty on 03/12/2019 09:59:31 -------------------------------------------------------------------------------- Compression Therapy Details Patient Name: Date of Service: [name], [name] 03/12/2019 9:15 AM Medical Record [id number] Patient Account Number: [id number] Date of Birth/Sex: Treating RN: [birth year] (78 y.o. [name] Primary Care Rossie Bretado: Yaakov Guthrie Other Clinician: Referring Amayrani Bennick: Treating Areona Homer/Extender:Robson, Elie Goody, Chevis Pretty in Treatment: 3 Compression Therapy Performed for Wound  Wound #1 Left Lower Leg Assessment: Performed By: Clinician [name], RN Compression Type: Three Layer Post Procedure Diagnosis Same as Pre-procedure Electronic Signature(s) Signed: 03/12/2019 5:23:18 PM By: [name] RN Entered By: [name] on 03/12/2019 10:45:24 -------------------------------------------------------------------------------- Encounter Discharge Information Details Patient Name: Date of Service: [name], [name] 03/12/2019 [place] Patient Account Number: [id number] Date of Birth/Sex: Treating RN: [birth year] (79 y.o. [name] Primary Care Niv Darley: Yaakov Guthrie Other Clinician: Referring Shakina Choy: Treating Dominique Ressel/Extender:Robson, Elie Goody, Chevis Pretty in Treatment: 3 Encounter Discharge Information Items Discharge Condition: Stable Ambulatory Status: Ambulatory Discharge Destination: Home Transportation: Private Auto Accompanied By: alone Schedule Follow-up Appointment: Yes Clinical Summary of Care: Patient Declined Electronic Signature(s) Signed: 04/04/2019 2:25:48 PM By: Levan Hurst RN, BSN Entered By: Levan Hurst on 03/12/2019 10:59:11 -------------------------------------------------------------------------------- Lower Extremity Assessment Details Patient Name: Date of Service: [name], [name] 03/12/2019 9:15 AM Medical Record [id number] Patient Account Number: [id number] Date of Birth/Sex: Treating RN: October 28, 1940 (79 y.o. [name] Primary Care Vaun Hyndman: Yaakov Guthrie Other Clinician: Referring Louie Flenner: Treating Devinn Hurwitz/Extender:Robson, Elie Goody, Chevis Pretty in Treatment: 3 Edema Assessment Assessed: [Left: No] [Right: No] Edema: [Left: Ye] [Right: s] Calf Left: Right: Point of Measurement: 32 cm From Medial Instep 32 cm cm Ankle Left: Right: Point of Measurement: 11 cm From Medial Instep 23.5 cm cm Vascular Assessment Pulses: Dorsalis Pedis Palpable:  [Left:Yes] Electronic Signature(s) Signed: 03/12/2019 5:43:30 PM By: Baruch Gouty RN, BSN Entered By: Baruch Gouty on 03/12/2019 10:08:15 -------------------------------------------------------------------------------- Multi Wound Chart Details Patient Name: Date of Service: [name], [name] 03/12/2019 9:15 AM Medical Record [id number] Patient Account Number: [id number] Date of Birth/Sex: Treating RN: Sep 19, 1940 (78 y.o. [name]) [name] Primary Care Lacye Mccarn: Yaakov Guthrie Other Clinician: Referring Dyami Umbach: Treating Kassadie Pancake/Extender:Robson, Elie Goody, Chevis Pretty in Treatment: 3 Vital Signs Height(in): 71 Pulse(bpm): 60 Weight(lbs): 175 Blood Pressure(mmHg): 136/64 Body Mass Index(BMI): 24 Temperature(F): 97.8 Respiratory 18 Rate(breaths/min): Photos: [1:No Photos] [N/A:N/A] Wound Location: [1:Left Lower Leg] [N/A:N/A] Wounding Event: [1:Trauma] [N/A:N/A] Primary Etiology: [1:Trauma, Other] [N/A:N/A] Secondary Etiology: [1:Infection -  not elsewhere classified] [N/A:N/A] Comorbid History: [1:Cataracts, Congestive Heart N/A Failure, Coronary Artery Disease, Hypertension, Hepatitis A, Gout] Date Acquired: [1:01/18/2019] [N/A:N/A] Weeks of Treatment: [1:3] [N/A:N/A] Wound Status: [1:Open] [N/A:N/A] Measurements L x W x D 9x2.5x0.8 [N/A:N/A] (cm) Area (cm) : [1:17.671] [N/A:N/A] Volume (cm) : [1:14.137] [N/A:N/A] % Reduction in Area: [1:48.90%] [N/A:N/A] % Reduction in Volume: [1:59.10%] [N/A:N/A] Classification: [1:Full Thickness Without Exposed Support Structures] [N/A:N/A] Exudate Amount: [1:Large] [N/A:N/A] Exudate Type: [1:Serosanguineous] [N/A:N/A] Exudate Color: [1:red, brown] [N/A:N/A] Wound Margin: [1:Distinct, outline attached N/A] Granulation Amount: [1:Large (67-100%)] [N/A:N/A] Granulation Quality: [1:Red, Hyper-granulation] [N/A:N/A] Necrotic Amount: [1:Small (1-33%)] [N/A:N/A] Exposed Structures: [1:Fat Layer (Subcutaneous N/A Tissue)  Exposed: Yes Fascia: No Tendon: No Muscle: No Joint: No Bone: No] Epithelialization: [1:Small (1-33%)] [N/A:N/A] Procedures Performed: [1:Chemical Cauterization Compression Therapy] [N/A:N/A] Treatment Notes Wound #1 (Left Lower Leg) 1. Cleanse With Soap and water 2. Periwound Care Barrier cream Moisturizing lotion 3. Primary Dressing Applied Hydrofera Blue 4. Secondary Dressing ABD Pad Dry Gauze 6. Support Layer Applied 3 layer compression Water quality scientist) Signed: 03/12/2019 5:22:39 PM By: Linton Ham MD Signed: 03/12/2019 5:23:18 PM By: [name] RN Entered By: Linton Ham on 03/12/2019 11:39:51 -------------------------------------------------------------------------------- Multi-Disciplinary Care Plan Details Patient Name: Date of Service: [name], [name] 03/12/2019 9:15 AM Medical Record [place] Patient Account Number: [id number] Date of Birth/Sex: Treating RN: 04-10-1940 (78 y.o. [name]) [name] Primary Care Bretton Tandy: Yaakov Guthrie Other Clinician: Referring Aaima Gaddie: Treating Fantashia Shupert/Extender:Robson, Elie Goody, Chevis Pretty in Treatment: 3 Active Inactive Wound/Skin Impairment Nursing Diagnoses: Knowledge deficit related to ulceration/compromised skin integrity Goals: Patient/caregiver will verbalize understanding of skin care regimen Date Initiated: 02/19/2019 [place] Date: 03/15/2019 Goal Status: Active Ulcer/skin breakdown will have a volume reduction of 30% by week 4 Date Initiated: 02/19/2019 [place] Resolution Date: 03/15/2019 Goal Status: Active Interventions: Assess patient/caregiver ability to obtain necessary supplies Assess patient/caregiver ability to perform ulcer/skin care regimen upon admission and as needed Assess ulceration(s) every visit Notes: Electronic Signature(s) Signed: 03/12/2019 5:23:18 PM By: [name] RN Entered By: [name] on 03/12/2019  09:31:18 -------------------------------------------------------------------------------- Pain Assessment Details Patient Name: Date of Service: [name], [name] 03/12/2019 [place] Patient Account Number: [id number] Date of Birth/Sex: Treating RN: [birth year] (79 y.o. [name] Primary Care Montgomery Rothlisberger: Yaakov Guthrie Other Clinician: Referring Hilja Kintzel: Treating Messiah Ahr/Extender:Robson, Elie Goody, Chevis Pretty in Treatment: 3 Active Problems Location of Pain Severity and Description of Pain Patient Has Paino No Site Locations Rate the pain. Current Pain Level: 0 Pain Management and Medication Current Pain Management: Electronic Signature(s) Signed: 03/12/2019 5:43:30 PM By: Baruch Gouty RN, BSN Entered By: Baruch Gouty on 03/12/2019 10:07:44 -------------------------------------------------------------------------------- Patient/Caregiver Education Details Patient Name: Date of Service: [name] 2/2/2021andnbsp9:15 AM Medical Record [id number] Patient Account Number: [id number] Date of Birth/Gender: 1940-04-27 (78 y.o. M) Treating RN: [name] Primary Care Physician: Yaakov Guthrie Other Clinician: Referring Physician: Treating Physician/Extender:Robson, Elie Goody, Chevis Pretty in Treatment: 3 Education Assessment Education Provided To: Patient Education Topics Provided Wound/Skin Impairment: Methods: Explain/Verbal Responses: State content correctly Electronic Signature(s) Signed: 03/12/2019 5:23:18 PM By: [name] RN Entered By: [name] on 03/12/2019 09:31:34 -------------------------------------------------------------------------------- Wound Assessment Details Patient Name: Date of Service: [name], [name] 03/12/2019 9:15 AM Medical Record [id number] Patient Account Number: [id number] Date of Birth/Sex: Treating RN: 1941/01/06 (78 y.o. [name] Primary Care Rayson Rando: Yaakov Guthrie Other Clinician: Referring Hali Balgobin: Treating Aliou Mealey/Extender:Robson, Elie Goody, Chevis Pretty in Treatment: 3 Wound Status Wound Number: 1 Primary Trauma, Other Etiology: Wound Location: Left Lower Leg Secondary Infection - not  elsewhere classified Wounding Event: Trauma Etiology: Date Acquired: 01/18/2019 Wound Open Weeks Of Treatment: 3 Status: Clustered Wound: No Comorbid Cataracts, Congestive Heart Failure, History: Coronary Artery Disease, Hypertension, Hepatitis A, Gout Photos Wound Measurements Length: (cm) 9 % Reduct Width: (cm) 2.5 % Reduct Depth: (cm) 0.8 Epitheli Area: (cm) 17.671 Tunneli Volume: (cm) 14.137 Undermi Wound Description Full Thickness Without Exposed Support Foul Odo Classification: Structures Slough/F Wound Distinct, outline attached Margin: Exudate Large Amount: Exudate Serosanguineous Type: Exudate red, brown Color: Wound Bed Granulation Amount: Large (67-100%) Granulation Quality: Red, Hyper-granulation Fascia E Necrotic Amount: Small (1-33%) Fat Laye Necrotic Quality: Adherent Slough Tendon E Muscle Joint E Bone Ex r After Cleansing: No ibrino Yes Exposed Structure xposed: No r (Subcutaneous Tissue) Exposed: Yes xposed: No Exposed: No xposed: No posed: No ion in Area: 48.9% ion in Volume: 59.1% alization: Small (1-33%) ng: No ning: No Electronic Signature(s) Signed: 03/13/2019 4:30:25 PM By: Mikeal Hawthorne EMT/HBOT Signed: 03/15/2019 5:25:54 PM By: [name] RN Previous Signature: 03/12/2019 5:43:30 PM Version By: Baruch Gouty RN, BSN Entered By: Mikeal Hawthorne on 03/13/2019 15:52:49 -------------------------------------------------------------------------------- Vitals Details Patient Name: Date of Service: [name], [name] 03/12/2019 9:15 AM Medical Record [id number] Patient Account Number: [id number] Date of Birth/Sex: Treating RN: [birth year] (79 y.o. [name] Primary Care Xaviera Flaten:  Yaakov Guthrie Other Clinician: Referring Tate Jerkins: Treating Windie Marasco/Extender:Robson, Elie Goody, Chevis Pretty in Treatment: 3 Vital Signs Time Taken: 09:59 Temperature (F): 97.8 Height (in): 71 Pulse (bpm): 60 Source: Stated Respiratory Rate (breaths/min): 18 Weight (lbs): 175 Blood Pressure (mmHg): 136/64 Source: Stated Reference Range: 80 - 120 mg / dl Body Mass Index (BMI): 24.4 Electronic Signature(s) Signed: 03/12/2019 5:43:30 PM By: Baruch Gouty RN, BSN Entered By: Baruch Gouty on 03/12/2019 10:07:23

## 2019-04-09 ENCOUNTER — Encounter (HOSPITAL_BASED_OUTPATIENT_CLINIC_OR_DEPARTMENT_OTHER): Payer: Medicare PPO | Attending: Internal Medicine | Admitting: Internal Medicine

## 2019-04-09 ENCOUNTER — Other Ambulatory Visit: Payer: Self-pay

## 2019-04-09 DIAGNOSIS — Y9355 Activity, bike riding: Secondary | ICD-10-CM | POA: Diagnosis not present

## 2019-04-09 DIAGNOSIS — I110 Hypertensive heart disease with heart failure: Secondary | ICD-10-CM | POA: Insufficient documentation

## 2019-04-09 DIAGNOSIS — I2510 Atherosclerotic heart disease of native coronary artery without angina pectoris: Secondary | ICD-10-CM | POA: Diagnosis not present

## 2019-04-09 DIAGNOSIS — L97822 Non-pressure chronic ulcer of other part of left lower leg with fat layer exposed: Secondary | ICD-10-CM | POA: Diagnosis present

## 2019-04-09 DIAGNOSIS — I509 Heart failure, unspecified: Secondary | ICD-10-CM | POA: Diagnosis not present

## 2019-04-09 DIAGNOSIS — Y9301 Activity, walking, marching and hiking: Secondary | ICD-10-CM | POA: Diagnosis not present

## 2019-04-09 DIAGNOSIS — M109 Gout, unspecified: Secondary | ICD-10-CM | POA: Diagnosis not present
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
## 2019-04-10 NOTE — Progress Notes (Addendum)
[name] ([id number]) , Visit Report for 04/09/2019 Arrival Information Details Patient Name: Date of Service: [name], [name] 04/09/2019 9:00 A M Medical Record Number: [id number] Patient Account Number: [id number] Date of Birth/Sex: Treating RN: 08/26/1940 (79 y.o. [name], Meta.Reding Primary Care Kaari Zeigler: WO NG, [name] Other Clinician: Referring Samya Siciliano: Treating Virginie Josten/Extender: [name] NG, [name] Weeks in Treatment: 7 Visit Information History Since Last Visit Added or deleted any medications: No Patient Arrived: [name] Any new allergies or adverse reactions: No Arrival Time: 09:09 Had a fall or experienced change in No Accompanied By: self activities of daily living that may affect Transfer Assistance: None risk of falls: Patient Identification Verified: Yes Signs or symptoms of abuse/neglect since last visito No Secondary Verification Process Completed: Yes Hospitalized since last visit: No Patient Requires Transmission-Based Precautions: No Implantable device outside of the clinic excluding No Patient Has Alerts: Yes cellular tissue based products placed in the center Patient Alerts: left ABI N/C since last visit: Has Dressing in Place as Prescribed: Yes Has Compression in Place as Prescribed: Yes Pain Present Now: No Electronic Signature(s) Signed: 04/09/2019 11:35:53 AM By: Deon Pilling Entered By: Deon Pilling on 04/09/2019 09:11:11 -------------------------------------------------------------------------------- Compression Therapy Details Patient Name: Date of Service: [name], [name] 04/09/2019 9:00 [place] Record Number: [id number] Patient Account Number: [id number] Date of Birth/Sex: Treating RN: 1940-12-19 (78 y.o. [name]) [name] Primary Care Harjot Dibello: Lottie Dawson NG, Pricilla Larsson NCIS Other Clinician: Referring Tiago Humphrey: Treating Shaneya Taketa/Extender: [name] NG, [name] Weeks in Treatment: 7 Compression Therapy Performed for Wound Assessment:  Wound #1 Left,Medial Lower Leg Performed By: Jake Church, RN Compression Type: Three Layer Post Procedure Diagnosis Same as Pre-procedure Electronic Signature(s) Signed: 04/10/2019 5:16:33 PM By: [name] RN Entered By: [name] on 04/09/2019 09:23:24 -------------------------------------------------------------------------------- Encounter Discharge Information Details Patient Name: Date of Service: [name], [name] 04/09/2019 9:00 A M Medical Record Number: [id number] Patient Account Number: [id number] Date of Birth/Sex: Treating RN: 06-10-1940 (79 y.o. [name] Primary Care Marielys Trinidad: Darrick Penna, [name] Other Clinician: Referring Estefanny Moler: Treating Jada Kuhnert/Extender: [name] NG, [name] Weeks in Treatment: 7 Encounter Discharge Information Items Discharge Condition: Stable Ambulatory Status: Cane Discharge Destination: Home Transportation: Private Auto Accompanied By: self Schedule Follow-up Appointment: Yes Clinical Summary of Care: Patient Declined Electronic Signature(s) Signed: 04/09/2019 5:07:00 PM By: Kela Millin Entered By: Kela Millin on 04/09/2019 09:52:57 -------------------------------------------------------------------------------- Lower Extremity Assessment Details Patient Name: Date of Service: [name], [name] 04/09/2019 9:00 A M Medical Record Number: [id number] Patient Account Number: [id number] Date of Birth/Sex: Treating RN: 1940/12/07 (79 y.o. Hessie Diener Primary Care Burleigh Brockmann: Darrick Penna, Pricilla Larsson NCIS Other Clinician: Referring Cleatus Gabriel: Treating Joellen Tullos/Extender: [name] NG, [name] Weeks in Treatment: 7 Edema Assessment Assessed: [Left: Yes] [Right: No] Edema: [Left: N] [Right: o] Calf Left: Right: Point of Measurement: 32 cm From Medial Instep 31 cm cm Ankle Left: Right: Point of Measurement: 11 cm From Medial Instep 21 cm cm Vascular Assessment Pulses: Dorsalis Pedis Palpable:  [Left:Yes] Electronic Signature(s) Signed: 04/09/2019 11:35:53 AM By: Deon Pilling Entered By: Deon Pilling on 04/09/2019 09:14:07 -------------------------------------------------------------------------------- Multi Wound Chart Details Patient Name: Date of Service: [name], [name] 04/09/2019 9:00 A M Medical Record Number: [id number] Patient Account Number: [id number] Date of Birth/Sex: Treating RN: 04/09/1940 (78 y.o. [name]) [name] Primary Care Anjuli Gemmill: Lottie Dawson NG, [name] Other Clinician: Referring Quamir Willemsen: Treating Lillian Tigges/Extender: [name] NG, [name] Weeks in Treatment: 7 Vital Signs Height(in): 71 Pulse(bpm): 77 Weight(lbs): 175 Blood Pressure(mmHg): 131/71 Body  Mass Index(BMI): 24 Temperature(F): 98.2 Respiratory Rate(breaths/min): 18 Photos: [1:No Photos Left Lower Leg - Medial] [N/A:N/A N/A] Wound Location: [1:Trauma] [N/A:N/A] Wounding Event: [1:Trauma, Other] [N/A:N/A] Primary Etiology: [1:Infection - not elsewhere classified] [N/A:N/A] Secondary Etiology: [1:Cataracts, Congestive Heart Failure,] [N/A:N/A] Comorbid History: [1:Coronary Artery Disease, Hypertension, Hepatitis A, Gout 01/18/2019] [N/A:N/A] Date Acquired: [1:7] [N/A:N/A] Weeks of Treatment: [1:Open] [N/A:N/A] Wound Status: [1:Yes] [N/A:N/A] Clustered Wound: [1:2] [N/A:N/A] Clustered Quantity: [1:3.7x0.2x0.1] [N/A:N/A] Measurements L x W x D (cm) [1:0.581] [N/A:N/A] A (cm) : rea [1:0.058] [N/A:N/A] Volume (cm) : [1:98.30%] [N/A:N/A] % Reduction in Area: [1:99.80%] [N/A:N/A] % Reduction in Volume: [1:Full Thickness Without Exposed] [N/A:N/A] Classification: [1:Support Structures Medium] [N/A:N/A] Exudate Amount: [1:Serosanguineous] [N/A:N/A] Exudate Type: [1:red, brown] [N/A:N/A] Exudate Color: [1:Flat and Intact] [N/A:N/A] Wound Margin: [1:Large (67-100%)] [N/A:N/A] Granulation Amount: [1:Red] [N/A:N/A] Granulation Quality: [1:None Present (0%)] [N/A:N/A] Necrotic Amount:  [1:Fat Layer (Subcutaneous Tissue)] [N/A:N/A] Exposed Structures: [1:Exposed: Yes Fascia: No Tendon: No Muscle: No Joint: No Bone: No Large (67-100%)] [N/A:N/A] Epithelialization: [1:Compression Therapy] [N/A:N/A] Treatment Notes Electronic Signature(s) Signed: 04/09/2019 5:14:23 PM By: Linton Ham MD Signed: 04/10/2019 5:16:33 PM By: [name] RN Entered By: Linton Ham on 04/09/2019 09:50:11 -------------------------------------------------------------------------------- Multi-Disciplinary Care Plan Details Patient Name: Date of Service: [name], [name] 04/09/2019 9:00 A M Medical Record Number: [id number] Patient Account Number: [id number] Date of Birth/Sex: Treating RN: Feb 28, 1940 (78 y.o. [name]) [name] Primary Care Shevy Yaney: Lottie Dawson NG, [name] Other Clinician: Referring Callaway Hardigree: Treating Ceasar Decandia/Extender: Linton Ham WO NG, [name] Weeks in Treatment: 7 Active Inactive Wound/Skin Impairment Nursing Diagnoses: Knowledge deficit related to ulceration/compromised skin integrity Goals: Patient/caregiver will verbalize understanding of skin care regimen Date Initiated: 02/19/2019 Date Inactivated: 03/26/2019 Target Resolution Date: 03/22/2019 Goal Status: Met Ulcer/skin breakdown will have a volume reduction of 30% by week 4 Date Initiated: 02/19/2019 Date Inactivated: 03/26/2019 Target Resolution Date: 03/22/2019 Goal Status: Met Ulcer/skin breakdown will have a volume reduction of 50% by week 8 Date Initiated: 03/26/2019 Target Resolution Date: 04/19/2019 Goal Status: Active Interventions: Assess patient/caregiver ability to obtain necessary supplies Assess patient/caregiver ability to perform ulcer/skin care regimen upon admission and as needed Assess ulceration(s) every visit Notes: Electronic Signature(s) Signed: 04/09/2019 9:14:02 AM By: [name] RN Entered By: [name] on 04/09/2019  08:52:54 -------------------------------------------------------------------------------- Pain Assessment Details Patient Name: Date of Service: [name], [name] 04/09/2019 9:00 A M Medical Record Number: [id number] Patient Account Number: [id number] Date of Birth/Sex: Treating RN: [birth year] (79 y.o. Hessie Diener Primary Care Shianne Zeiser: Darrick Penna, [name] Other Clinician: Referring Kalden Wanke: Treating Ronnette Rump/Extender: [name] NG, [name] Weeks in Treatment: 7 Active Problems Location of Pain Severity and Description of Pain Patient Has Paino No Site Locations Rate the pain. Rate the pain. Current Pain Level: 0 Pain Management and Medication Current Pain Management: Medication: No Cold Application: No Rest: No Massage: No Activity: No T.E.N.S.: No Heat Application: No Leg drop or elevation: No Is the Current Pain Management Adequate: Adequate How does your wound impact your activities of daily livingo Sleep: No Bathing: No Appetite: No Relationship With Others: No Bladder Continence: No Emotions: No Bowel Continence: No Work: No Toileting: No Drive: No Dressing: No Hobbies: No Electronic Signature(s) Signed: 04/09/2019 11:35:53 AM By: Deon Pilling Entered By: Deon Pilling on 04/09/2019 09:11:24 -------------------------------------------------------------------------------- Patient/Caregiver Education Details Patient Name: Date of Service: [name] 3/2/2021andnbsp9:00 A M Medical Record Number: [id number] Patient Account Number: [id number] Date of Birth/Gender: Treating RN: 1940/11/26 (78 y.o. [name]) [name] Primary Care Physician: Lottie Dawson NG, Pricilla Larsson NCIS Other Clinician: Referring Physician:  Treating Physician/Extender: Linton Ham WO NG, [name] Weeks in Treatment: 7 Education Assessment Education Provided To: Patient Education Topics Provided Wound/Skin Impairment: Methods: Explain/Verbal Responses: State content  correctly Electronic Signature(s) Signed: 04/09/2019 9:14:02 AM By: [name] RN Entered By: [name] on 04/09/2019 08:53:19 -------------------------------------------------------------------------------- Wound Assessment Details Patient Name: Date of Service: [name], [name] 04/09/2019 9:00 A M Medical Record Number: [id number] Patient Account Number: [id number] Date of Birth/Sex: Treating RN: Jun 21, 1940 (78 y.o. [name]) [name] Primary Care Yanett Conkright: Lottie Dawson NG, [name] Other Clinician: Referring Mishaal Lansdale: Treating Trestan Vahle/Extender: [name] NG, [name] Weeks in Treatment: 7 Wound Status Wound Number: 1 Primary Trauma, Other Etiology: Wound Location: Left Lower Leg - Medial Secondary Infection - not elsewhere classified Wounding Event: Trauma Etiology: Date Acquired: 01/18/2019 Wound Open Weeks Of Treatment: 7 Status: Clustered Wound: Yes Comorbid Cataracts, Congestive Heart Failure, Coronary Artery Disease, History: Hypertension, Hepatitis A, Gout Photos Wound Measurements Length: (cm) 3.7 Width: (cm) 0.2 Depth: (cm) 0.1 Clustered Quantity: 2 Area: (cm) 0.581 Volume: (cm) 0.058 % Reduction in Area: 98.3% % Reduction in Volume: 99.8% Epithelialization: Large (67-100%) Tunneling: No Undermining: No Wound Description Classification: Full Thickness Without Exposed Support Structures Wound Margin: Flat and Intact Exudate Amount: Medium Exudate Type: Serosanguineous Exudate Color: red, brown Foul Odor After Cleansing: No Slough/Fibrino No Wound Bed Granulation Amount: Large (67-100%) Exposed Structure Granulation Quality: Red Fascia Exposed: No Necrotic Amount: None Present (0%) Fat Layer (Subcutaneous Tissue) Exposed: Yes Tendon Exposed: No Muscle Exposed: No Joint Exposed: No Bone Exposed: No Electronic Signature(s) Signed: 04/12/2019 4:01:29 PM By: Mikeal Hawthorne EMT/HBOT Signed: 08/06/2019 5:25:54 PM By: [name] RN Previous Signature:  04/09/2019 11:35:53 AM Version By: Deon Pilling Entered By: Mikeal Hawthorne on 04/12/2019 13:48:21 -------------------------------------------------------------------------------- Vitals Details Patient Name: Date of Service: [name], [name] 04/09/2019 9:00 A M Medical Record Number: [id number] Patient Account Number: [id number] Date of Birth/Sex: Treating RN: 05/29/1940 (79 y.o. Hessie Diener Primary Care Cordae Mccarey: WO NG, [name] Other Clinician: Referring Lovel Suazo: Treating Krystale Rinkenberger/Extender: [name] NG, [name] Weeks in Treatment: 7 Vital Signs Time Taken: 09:12 Temperature (F): 98.2 Height (in): 71 Pulse (bpm): 77 Weight (lbs): 175 Respiratory Rate (breaths/min): 18 Body Mass Index (BMI): 24.4 Blood Pressure (mmHg): 131/71 Reference Range: 80 - 120 mg / dl Electronic Signature(s) Signed: 04/09/2019 11:35:53 AM By: Deon Pilling Entered By: Deon Pilling on 04/09/2019 09:12:59

## 2019-04-16 ENCOUNTER — Encounter (HOSPITAL_BASED_OUTPATIENT_CLINIC_OR_DEPARTMENT_OTHER): Payer: Medicare PPO | Admitting: Internal Medicine

## 2019-04-16 ENCOUNTER — Other Ambulatory Visit: Payer: Self-pay

## 2019-04-16 DIAGNOSIS — L97822 Non-pressure chronic ulcer of other part of left lower leg with fat layer exposed: Secondary | ICD-10-CM | POA: Diagnosis not present

## 2019-04-22 NOTE — Progress Notes (Signed)
[name], [name] (RN:[id number]) Visit Report for 04/16/2019 SuperBill Details Patient Name: Date of Service: [name], [name] 04/16/2019 Medical Record [id number] Patient Account Number: [id number] Date of Birth/Sex: Treating RN: 25-Jan-1941 (79 y.o. [name] Primary Care Provider: Yaakov Guthrie Other Clinician: Referring Provider: Treating Provider/Extender:Wells Mabe, Elie Goody, Chevis Pretty in Treatment: 8 Diagnosis Coding ICD-10 Codes Code Description [phone] Laceration without foreign body, left lower leg, subsequent encounter L97.822 Non-pressure chronic ulcer of other part of left lower leg with fat layer exposed Facility Procedures CPT4 Code Description Modifier Quantity [id number] (Facility Use Only) [phone] - [place] 1 Electronic Signature(s) Signed: 04/16/2019 5:48:31 PM By: Linton Ham MD Signed: 04/22/2019 5:44:18 PM By: Levan Hurst RN, BSN Entered By: Levan Hurst on 04/16/2019 12:11:22

## 2019-04-23 ENCOUNTER — Encounter (HOSPITAL_BASED_OUTPATIENT_CLINIC_OR_DEPARTMENT_OTHER): Payer: Medicare PPO | Admitting: Internal Medicine

## 2019-04-23 ENCOUNTER — Other Ambulatory Visit: Payer: Self-pay

## 2019-04-23 DIAGNOSIS — L97822 Non-pressure chronic ulcer of other part of left lower leg with fat layer exposed: Secondary | ICD-10-CM | POA: Diagnosis not present
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
## 2019-04-30 ENCOUNTER — Encounter (HOSPITAL_BASED_OUTPATIENT_CLINIC_OR_DEPARTMENT_OTHER): Payer: Medicare PPO | Admitting: Internal Medicine

## 2019-04-30 ENCOUNTER — Other Ambulatory Visit: Payer: Self-pay

## 2019-04-30 DIAGNOSIS — L97822 Non-pressure chronic ulcer of other part of left lower leg with fat layer exposed: Secondary | ICD-10-CM | POA: Diagnosis not present
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
## 2019-05-07 ENCOUNTER — Other Ambulatory Visit: Payer: Self-pay

## 2019-05-07 ENCOUNTER — Encounter (HOSPITAL_BASED_OUTPATIENT_CLINIC_OR_DEPARTMENT_OTHER): Payer: Medicare PPO | Admitting: Internal Medicine

## 2019-05-07 DIAGNOSIS — L97822 Non-pressure chronic ulcer of other part of left lower leg with fat layer exposed: Secondary | ICD-10-CM | POA: Diagnosis not present

## 2019-05-08 NOTE — Progress Notes (Addendum)
[name] ([id number]) , Visit Report for 05/07/2019 Arrival Information Details Patient Name: Date of Service: [name], [name] 05/07/2019 3:30 PM Medical Record Number: [id number] Patient Account Number: [id number] Date of Birth/Sex: Treating RN: Mar 12, 1940 (79 y.o. [name], Meta.Reding Primary Care [name]: WO NG, [name] Other Clinician: Referring [name]: Treating [name]/Extender: [name] NG, [name] Weeks in Treatment: 11 Visit Information History Since Last Visit Added or deleted any medications: No Patient Arrived: Ambulatory Any new allergies or adverse reactions: No Accompanied By: self Had a fall or experienced change in No Transfer Assistance: None activities of daily living that may affect Patient Identification Verified: Yes risk of falls: Secondary Verification Process Completed: Yes Signs or symptoms of abuse/neglect since last visito No Patient Requires Transmission-Based Precautions: No Hospitalized since last visit: No Patient Has Alerts: Yes Implantable device outside of the clinic excluding No Patient Alerts: left ABI N/C cellular tissue based products placed in the center since last visit: Has Dressing in Place as Prescribed: Yes Has Compression in Place as Prescribed: Yes Pain Present Now: No Electronic Signature(s) Signed: 05/08/2019 5:08:19 PM By: [name] Entered By: [name] on 05/08/2019 07:51:54 -------------------------------------------------------------------------------- Clinic Level of Care Assessment Details Patient Name: Date of Service: [name] 05/07/2019 3:30 PM Medical Record Number: [id number] Patient Account Number: [id number] Date of Birth/Sex: Treating RN: 1940/02/13 (78 y.o. [name]) [name] Primary Care [name]: [name] NG, [name] Other Clinician: Referring [name]: Treating [name]/Extender: [name] NG, [name] Weeks in Treatment: 11 Clinic Level of Care Assessment Items TOOL 4  Quantity Score X- 1 0 Use when only an EandM is performed on FOLLOW-UP visit ASSESSMENTS - Nursing Assessment / Reassessment X- 1 10 Reassessment of Co-morbidities (includes updates in patient status) X- 1 5 Reassessment of Adherence to Treatment Plan ASSESSMENTS - Wound and Skin A ssessment / Reassessment X - Simple Wound Assessment / Reassessment - one wound 1 5 []  - 0 Complex Wound Assessment / Reassessment - multiple wounds []  - 0 Dermatologic / Skin Assessment (not related to wound area) ASSESSMENTS - Focused Assessment []  - 0 Circumferential Edema Measurements - multi extremities []  - 0 Nutritional Assessment / Counseling / Intervention []  - 0 Lower Extremity Assessment (monofilament, tuning fork, pulses) []  - 0 Peripheral Arterial Disease Assessment (using hand held doppler) ASSESSMENTS - Ostomy and/or Continence Assessment and Care []  - 0 Incontinence Assessment and Management []  - 0 Ostomy Care Assessment and Management (repouching, etc.) PROCESS - Coordination of Care X - Simple Patient / Family Education for ongoing care 1 15 []  - 0 Complex (extensive) Patient / Family Education for ongoing care X- 1 10 Staff obtains Programmer, systems, Records, T Results / Process Orders est []  - 0 Staff telephones HHA, Nursing Homes / Clarify orders / etc []  - 0 Routine Transfer to another Facility (non-emergent condition) []  - 0 Routine Hospital Admission (non-emergent condition) []  - 0 New Admissions / Biomedical engineer / Ordering NPWT Apligraf, etc. , []  - 0 Emergency Hospital Admission (emergent condition) X- 1 10 Simple Discharge Coordination []  - 0 Complex (extensive) Discharge Coordination PROCESS - Special Needs []  - 0 Pediatric / Minor Patient Management []  - 0 Isolation Patient Management []  - 0 Hearing / Language / Visual special needs []  - 0 Assessment of Community assistance (transportation, D/C planning, etc.) []  - 0 Additional assistance / Altered  mentation []  - 0 Support Surface(s) Assessment (bed, cushion, seat, etc.) INTERVENTIONS - Wound Cleansing / Measurement X - Simple Wound Cleansing - one wound  1 5 []  - 0 Complex Wound Cleansing - multiple wounds X- 1 5 Wound Imaging (photographs - any number of wounds) []  - 0 Wound Tracing (instead of photographs) X- 1 5 Simple Wound Measurement - one wound []  - 0 Complex Wound Measurement - multiple wounds INTERVENTIONS - Wound Dressings []  - 0 Small Wound Dressing one or multiple wounds []  - 0 Medium Wound Dressing one or multiple wounds []  - 0 Large Wound Dressing one or multiple wounds []  - 0 Application of Medications - topical []  - 0 Application of Medications - injection INTERVENTIONS - Miscellaneous []  - 0 External ear exam []  - 0 Specimen Collection (cultures, biopsies, blood, body fluids, etc.) []  - 0 Specimen(s) / Culture(s) sent or taken to Lab for analysis []  - 0 Patient Transfer (multiple staff / Civil Service fast streamer / Similar devices) []  - 0 Simple Staple / Suture removal (25 or less) []  - 0 Complex Staple / Suture removal (26 or more) []  - 0 Hypo / Hyperglycemic Management (close monitor of Blood Glucose) []  - 0 Ankle / Brachial Index (ABI) - do not check if billed separately X- 1 5 Vital Signs Has the patient been seen at the hospital within the last three years: Yes Total Score: 75 Level Of Care: New/Established - Level 2 Electronic Signature(s) Signed: 05/08/2019 5:40:01 PM By: Jesse Coria RN Entered By: [name] on 05/08/2019 09:08:54 -------------------------------------------------------------------------------- Encounter Discharge Information Details Patient Name: Date of Service: [name], [name] 05/07/2019 3:30 PM Medical Record Number: [id number] Patient Account Number: [id number] Date of Birth/Sex: Treating RN: 04-13-1940 (79 y.o. [name] Primary Care [name]: [name], [name] Other Clinician: Referring [name]: Treating  [name]/Extender: [name] NG, [name] Weeks in Treatment: 11 Encounter Discharge Information Items Discharge Condition: Stable Ambulatory Status: Ambulatory Discharge Destination: Home Transportation: Private Auto Accompanied By: self Schedule Follow-up Appointment: Yes Clinical Summary of Care: Patient Declined Electronic Signature(s) Signed: 05/13/2019 4:55:48 PM By: Kela Millin Entered By: Kela Millin on 05/10/2019 08:44:46 -------------------------------------------------------------------------------- Lower Extremity Assessment Details Patient Name: Date of Service: [name], [name] 05/07/2019 3:30 PM Medical Record Number: [id number] Patient Account Number: [id number] Date of Birth/Sex: Treating RN: 1940-06-23 (79 y.o. Hessie Diener Primary Care Terik Haughey: [name], Pricilla Larsson NCIS Other Clinician: Referring Khamani Daniely: Treating Jordyan Hardiman/Extender: [name] NG, [name] Weeks in Treatment: 11 Edema Assessment Assessed: [Left: Yes] [Right: No] Edema: [Left: N] [Right: o] Calf Left: Right: Point of Measurement: 32 cm From Medial Instep 30 cm cm Ankle Left: Right: Point of Measurement: 11 cm From Medial Instep 22 cm cm Vascular Assessment Pulses: Dorsalis Pedis Palpable: [Left:Yes] Electronic Signature(s) Signed: 05/08/2019 5:08:19 PM By: [name] Entered By: [name] on 05/08/2019 07:52:30 -------------------------------------------------------------------------------- Multi Wound Chart Details Patient Name: Date of Service: [name], [name] 05/07/2019 3:30 PM Medical Record Number: [id number] Patient Account Number: [id number] Date of Birth/Sex: Treating RN: [birth year] (78 y.o. [name]) [name] Primary Care Danitza Schoenfeldt: [name] NG, [name] Other Clinician: Referring Jakiera Ehler: Treating Warda Mcqueary/Extender: [name] NG, [name] Weeks in Treatment: 11 Vital Signs Height(in): 71 Pulse(bpm): 62 Weight(lbs): 175 Blood Pressure(mmHg):  148/72 Body Mass Index(BMI): 24 Temperature(F): 97.9 Respiratory Rate(breaths/min): 18 Photos: [N/A:N/A] Left, Medial Lower Leg N/A N/A Wound Location: Trauma N/A N/A Wounding Event: Trauma, Other N/A N/A Primary Etiology: Infection - not elsewhere classified N/A N/A Secondary Etiology: 01/18/2019 N/A N/A Date Acquired: 11 N/A N/A Weeks of Treatment: Healed - Epithelialized N/A N/A Wound Status: Yes N/A N/A Clustered Wound: 0x0x0 N/A N/A Measurements L x W  x D (cm) 0 N/A N/A A (cm) : rea 0 N/A N/A Volume (cm) : 100.00% N/A N/A % Reduction in A rea: 100.00% N/A N/A % Reduction in Volume: Full Thickness Without Exposed N/A N/A Classification: Support Structures Treatment Notes Electronic Signature(s) Signed: 05/13/2019 7:44:34 AM By: Linton Ham MD Signed: 08/06/2019 5:24:59 PM By: Jesse Coria RN Entered By: Linton Ham on 05/12/2019 06:58:51 -------------------------------------------------------------------------------- Multi-Disciplinary Care Plan Details Patient Name: Date of Service: [name], [name] 05/07/2019 3:30 PM Medical Record Number: [id number] Patient Account Number: [id number] Date of Birth/Sex: Treating RN: [birth year] (78 y.o. [name] Primary Care Amyrie Illingworth: [name], [name] Other Clinician: Referring Rexton Greulich: Treating Moncerrat Burnstein/Extender: [name] NG, [name] Weeks in Treatment: 11 Active Inactive Electronic Signature(s) Signed: 05/08/2019 5:40:01 PM By: Jesse Coria RN Entered By: [name] on 05/08/2019 09:07:39 -------------------------------------------------------------------------------- Pain Assessment Details Patient Name: Date of Service: [name], [name] 05/07/2019 3:30 PM Medical Record Number: [id number] Patient Account Number: [id number] Date of Birth/Sex: Treating RN: 07/19/1940 (79 y.o. Hessie Diener Primary Care Toben Acuna: [name], [name] Other Clinician: Referring Muhamed Luecke: Treating  Briar Witherspoon/Extender: [name] NG, [name] Weeks in Treatment: 11 Active Problems Location of Pain Severity and Description of Pain Patient Has Paino No Site Locations Rate the pain. Current Pain Level: 0 Pain Management and Medication Current Pain Management: Medication: No Cold Application: No Rest: No Massage: No Activity: No T.E.N.S.: No Heat Application: No Leg drop or elevation: No Is the Current Pain Management Adequate: Adequate How does your wound impact your activities of daily livingo Sleep: No Bathing: No Appetite: No Relationship With Others: No Bladder Continence: No Emotions: No Bowel Continence: No Work: No Toileting: No Drive: No Dressing: No Hobbies: No Electronic Signature(s) Signed: 05/08/2019 5:08:19 PM By: [name] Entered By: [name] on 05/08/2019 07:52:19 -------------------------------------------------------------------------------- Patient/Caregiver Education Details Patient Name: Date of Service: [name] 3/30/2021andnbsp3:30 PM Medical Record Number: [id number] Patient Account Number: [id number] Date of Birth/Gender: Treating RN: Jun 13, 1940 (78 y.o. [name]) [name] Primary Care Physician: [name], Pricilla Larsson NCIS Other Clinician: Referring Physician: Treating Physician/Extender: [name] NG, [name] Weeks in Treatment: 11 Education Assessment Education Provided To: Patient Education Topics Provided Safety: Methods: Explain/Verbal Responses: State content correctly Electronic Signature(s) Signed: 05/08/2019 5:40:01 PM By: Jesse Coria RN Entered By: [name] on 05/08/2019 09:08:14 -------------------------------------------------------------------------------- Wound Assessment Details Patient Name: Date of Service: [name], [name] 05/07/2019 3:30 PM Medical Record Number: [id number] Patient Account Number: [id number] Date of Birth/Sex: Treating RN: 23-Feb-1940 (79 y.o. Hessie Diener Primary  Care Jesse Flam: WO NG, [name] Other Clinician: Referring Larrell Rapozo: Treating Marion Seese/Extender: [name] NG, [name] Weeks in Treatment: 11 Wound Status Wound Number: 1 Primary Etiology: Trauma, Other Wound Location: Left, Medial Lower Leg Secondary Etiology: Infection - not elsewhere classified Wounding Event: Trauma Wound Status: Healed - Epithelialized Date Acquired: 01/18/2019 Weeks Of Treatment: 11 Clustered Wound: Yes Photos Photo Uploaded By: Mikeal Hawthorne on 05/10/2019 11:50:08 Wound Measurements Length: (cm) Width: (cm) Depth: (cm) Area: (cm) Volume: (cm) 0 % Reduction in Area: 100% 0 % Reduction in Volume: 100% 0 0 0 Wound Description Classification: Full Thickness Without Exposed Support Structur es Electronic Signature(s) Signed: 05/08/2019 5:08:19 PM By: [name] Entered By: [name] on 05/08/2019 07:52:36 -------------------------------------------------------------------------------- Vitals Details Patient Name: Date of Service: [name], [name] 05/07/2019 3:30 PM Medical Record Number: [id number] Patient Account Number: [id number] Date of Birth/Sex: Treating RN: 08-08-1940 (79 y.o. Hessie Diener Primary Care Rochanda Harpham: WO NG, [name] Other Clinician:  Referring Lavren Lewan: Treating Varie Machamer/Extender: [name] NG, [name] Weeks in Treatment: 11 Vital Signs Time Taken: 15:15 Temperature (F): 97.9 Height (in): 71 Pulse (bpm): 62 Weight (lbs): 175 Respiratory Rate (breaths/min): 18 Body Mass Index (BMI): 24.4 Blood Pressure (mmHg): 148/72 Reference Range: 80 - 120 mg / dl Electronic Signature(s) Signed: 05/08/2019 5:08:19 PM By: [name] Entered By: [name] on 05/08/2019 07:52:10
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
## 2019-05-17 DIAGNOSIS — M25561 Pain in right knee: Secondary | ICD-10-CM | POA: Insufficient documentation

## 2019-05-22 NOTE — Progress Notes (Signed)
[name], [name] ([id number]) Visit Report for 04/16/2019 Arrival Information Details Patient Name: Date of Service: [name], [name] 04/16/2019 10:15 AM Medical Record [id number] Patient Account Number: [id number] Date of Birth/Sex: Treating RN: [birth year] (78 y.o. [name]) [name] Primary Care [name]: Yaakov Guthrie Other Clinician: Referring Joeanna Howdyshell: Treating Mychal Durio/Extender:Robson, Elie Goody, Chevis Pretty in Treatment: 8 Visit Information History Since Last Visit Added or deleted any medications: No Patient Arrived: Kasandra Knudsen Any new allergies or adverse reactions: No Arrival Time: 11:00 Had a fall or experienced change in No Accompanied By: self activities of daily living that may affect Transfer Assistance: None risk of falls: Patient Identification Verified: Yes Signs or symptoms of abuse/neglect since last No Secondary Verification Process Completed: Yes visito Patient Requires Transmission-Based No Hospitalized since last visit: No Precautions: Implantable device outside of the clinic excluding No Patient Has Alerts: Yes cellular tissue based products placed in the center Patient Alerts: left ABI since last visit: N/C Has Dressing in Place as Prescribed: Yes Pain Present Now: No Electronic Signature(s) Signed: 05/22/2019 9:21:08 AM By: Sandre Kitty Entered By: Sandre Kitty on 04/16/2019 11:00:40 -------------------------------------------------------------------------------- Compression Therapy Details Patient Name: Date of Service: CALEY, FARNER 04/16/2019 10:15 AM Medical Record [id number] Patient Account Number: [id number] Date of Birth/Sex: Treating RN: [birth year] (79 y.o. [name] Primary Care Mohamad Bruso: Yaakov Guthrie Other Clinician: Referring Tranise Forrest: Treating Amen Dargis/Extender:Robson, Elie Goody, Chevis Pretty in Treatment: 8 Compression Therapy Performed for Wound Wound #1 Left,Medial Lower Leg Assessment: Performed By:  Clinician Levan Hurst, RN Compression Type: Three Hydrologist) Signed: 04/22/2019 5:44:18 PM By: Levan Hurst RN, BSN Entered By: Levan Hurst on 04/16/2019 12:10:27 -------------------------------------------------------------------------------- Encounter Discharge Information Details Patient Name: Date of Service: [name], [name] 04/16/2019 10:15 AM Medical Record [id number] Patient Account Number: [id number] Date of Birth/Sex: Treating RN: Sep 03, 1940 (79 y.o. [name] Primary Care Kiarra Kidd: Yaakov Guthrie Other Clinician: Referring Cadience Bradfield: Treating Azalea Cedar/Extender:Robson, Elie Goody, Chevis Pretty in Treatment: 8 Encounter Discharge Information Items Discharge Condition: Stable Ambulatory Status: Walker Discharge Destination: Home Transportation: Private Auto Accompanied By: alone Schedule Follow-up Appointment: Yes Clinical Summary of Care: Patient Declined Electronic Signature(s) Signed: 04/22/2019 5:44:18 PM By: Levan Hurst RN, BSN Entered By: Levan Hurst on 04/16/2019 12:11:14 -------------------------------------------------------------------------------- Pain Assessment Details Patient Name: Date of Service: [name], [name] 04/16/2019 10:15 AM Medical Record [id number] Patient Account Number: [id number] Date of Birth/Sex: Treating RN: Dec 10, 1940 (78 y.o. [name]) [name] Primary Care Keiden Deskin: Yaakov Guthrie Other Clinician: Referring Dalis Beers: Treating Louella Medaglia/Extender:Robson, Elie Goody, Chevis Pretty in Treatment: 8 Active Problems Location of Pain Severity and Description of Pain Patient Has Paino No Site Locations Pain Management and Medication Current Pain Management: Electronic Signature(s) Signed: 04/17/2019 7:20:02 AM By: [name] RN Signed: 05/22/2019 9:21:08 AM By: Sandre Kitty Entered By: Sandre Kitty on 04/16/2019  11:03:12 -------------------------------------------------------------------------------- Wound Assessment Details Patient Name: Date of Service: [name], [name] 04/16/2019 10:15 AM Medical Record [id number] Patient Account Number: [id number] Date of Birth/Sex: Treating RN: September 27, 1940 (78 y.o. [name]) [name] Primary Care Lexie Morini: Yaakov Guthrie Other Clinician: Referring Dyson Sevey: Treating Rhilee Currin/Extender:Robson, Elie Goody, Chevis Pretty in Treatment: 8 Wound Status Wound Number: 1 Primary Trauma, Other Etiology: Wound Location: Left Lower Leg - Medial Secondary Infection - not elsewhere classified Wounding Event: Trauma Etiology: Date Acquired: 01/18/2019 Wound Open Weeks Of Treatment: 8 Status: Clustered Wound: Yes Comorbid Cataracts, Congestive Heart Failure, History: Coronary Artery Disease, Hypertension, Hepatitis A, Gout Photos Wound Measurements Length: (cm) 1.4 % Reduct Width: (cm) 0.3 % Reduct Depth: (cm) 0.1 Epitheli Clustered Quantity: 1 Tunnelin Area: (cm) 0.33  Undermi Volume: (cm) 0.033 Wound Description Full Thickness Without Exposed Support Foul Od Classification: Structures Slough/ Wound Flat and Intact Margin: Exudate Small Amount: Exudate Serosanguineous Type: Exudate red, brown Color: Wound Bed Granulation Amount: Large (67-100%) Granulation Quality: Red Fascia E Necrotic Amount: None Present (0%) Fat Laye Tendon E Muscle E Joint Ex Bone Exp or After Cleansing: No Fibrino No Exposed Structure xposed: No r (Subcutaneous Tissue) Exposed: Yes xposed: No xposed: No posed: No osed: No ion in Area: 99% ion in Volume: 99.9% alization: Large (67-100%) g: No ning: No Electronic Signature(s) Signed: 04/17/2019 2:27:25 PM By: Mikeal Hawthorne EMT/HBOT Signed: 04/17/2019 5:35:23 PM By: [name] RN Previous Signature: 04/17/2019 7:20:02 AM Version By: [name] RN Entered By: Mikeal Hawthorne on 04/17/2019  11:58:11 -------------------------------------------------------------------------------- Vitals Details Patient Name: Date of Service: [name], [name] 04/16/2019 10:15 AM Medical Record [id number] Patient Account Number: [id number] Date of Birth/Sex: Treating RN: 01/19/1941 (78 y.o. [name]) Dolores Lory, [place] Primary Care Darcel Frane: Yaakov Guthrie Other Clinician: Referring Dayon Witt: Treating Rashae Rother/Extender:Robson, Elie Goody, Chevis Pretty in Treatment: 8 Vital Signs Time Taken: 11:00 Temperature (F): 97.8 Height (in): 71 Pulse (bpm): 70 Weight (lbs): 175 Respiratory Rate (breaths/min): 18 Body Mass Index (BMI): 24.4 Blood Pressure (mmHg): 143/57 Reference Range: 80 - 120 mg / dl Electronic Signature(s) Signed: 05/22/2019 9:21:08 AM By: Sandre Kitty Entered By: Sandre Kitty on 04/16/2019 11:03:08

## 2019-05-22 NOTE — Progress Notes (Signed)
[name] ([id number]) Visit Report for 04/23/2019 Arrival Information Details Patient Name: Date of Service: [name], [name] 04/23/2019 [place] [id number] Patient Account Number: [id number] Date of Birth/Sex: Treating RN: 12-Dec-1940 (78 y.o. [name]) Dolores Lory, Morey Hummingbird Primary Care Lailany Enoch: Yaakov Guthrie Other Clinician: Referring Ichael Pullara: Treating Neoma Uhrich/Extender:Robson, Elie Goody, Chevis Pretty in Treatment: 9 Visit Information History Since Last Visit Added or deleted any medications: No Patient Arrived: Kasandra Knudsen Any new allergies or adverse reactions: No Arrival Time: 09:52 Had a fall or experienced change in No Accompanied By: self activities of daily living that may affect Transfer Assistance: None risk of falls: Patient Identification Verified: Yes Signs or symptoms of abuse/neglect since last No Secondary Verification Process Completed: Yes visito Patient Requires Transmission-Based No Hospitalized since last visit: No Precautions: Implantable device outside of the clinic excluding No Patient Has Alerts: Yes cellular tissue based products placed in the center Patient Alerts: left ABI since last visit: N/C Has Dressing in Place as Prescribed: Yes Pain Present Now: No Electronic Signature(s) Signed: 05/22/2019 9:21:08 AM By: Sandre Kitty Entered By: Sandre Kitty on 04/23/2019 09:52:41 -------------------------------------------------------------------------------- Compression Therapy Details Patient Name: Date of Service: [name] 04/23/2019 [place] Patient Account Number: [id number] Date of Birth/Sex: Treating RN: [birth year] (78 y.o. [name] Primary Care Nochum Fenter: Yaakov Guthrie Other Clinician: Referring Antwaine Boomhower: Treating Theador Jezewski/Extender:Robson, Elie Goody, Chevis Pretty in Treatment: 9 Compression Therapy Performed for Wound Wound #1 Left,Medial Lower Leg Assessment: Performed By:  Jake Church, RN Compression Type: Three Layer Post Procedure Diagnosis Same as Pre-procedure Electronic Signature(s) Signed: 04/24/2019 8:57:53 AM By: [name] RN Entered By: [name] on 04/23/2019 10:39:18 -------------------------------------------------------------------------------- Encounter Discharge Information Details Patient Name: Date of Service: [name] 04/23/2019 [place] Patient Account Number: [id number] Date of Birth/Sex: Treating RN: 1940/06/20 (79 y.o. [name] Primary Care Virgie Kunda: Yaakov Guthrie Other Clinician: Referring Ahniyah Giancola: Treating Tonita Bills/Extender:Robson, Elie Goody, Chevis Pretty in Treatment: 9 Encounter Discharge Information Items Discharge Condition: Stable Ambulatory Status: Cane Discharge Destination: Home Transportation: Private Auto Accompanied By: self Schedule Follow-up Appointment: Yes Clinical Summary of Care: Patient Declined Electronic Signature(s) Signed: 04/23/2019 5:07:08 PM By: Kela Millin Entered By: Kela Millin on 04/23/2019 10:49:12 -------------------------------------------------------------------------------- Lower Extremity Assessment Details Patient Name: Date of Service: [name] 04/23/2019 [place] [id number] Patient Account Number: [id number] Date of Birth/Sex: Treating RN: September 24, 1940 (78 y.o. [name]) [name] Primary Care Carvel Huskins: Yaakov Guthrie Other Clinician: Referring Quinley Nesler: Treating Darrio Bade/Extender:Robson, Elie Goody, Chevis Pretty in Treatment: 9 Edema Assessment Assessed: [Left: No] [Right: No] Edema: [Left: N] [Right: o] Calf Left: Right: Point of Measurement: 32 cm From Medial Instep 30.7 cm cm Ankle Left: Right: Point of Measurement: 11 cm From Medial Instep 21.4 cm cm Vascular Assessment Pulses: Dorsalis Pedis Palpable: [Left:Yes] Electronic Signature(s) Signed: 04/24/2019 8:57:53 AM  By: [name] RN Signed: 05/22/2019 9:21:08 AM By: Sandre Kitty Entered By: Sandre Kitty on 04/23/2019 10:02:27 -------------------------------------------------------------------------------- Multi Wound Chart Details Patient Name: Date of Service: [name] 04/23/2019 9:15 AM Medical Record [id number] Patient Account Number: [id number] Date of Birth/Sex: Treating RN: 05/30/1940 (78 y.o. [name] Primary Care Jerold Yoss: Yaakov Guthrie Other Clinician: Referring Anitria Andon: Treating Kiree Dejarnette/Extender:Robson, Elie Goody, Chevis Pretty in Treatment: 9 Vital Signs Height(in): 71 Pulse(bpm): 6 Weight(lbs): 175 Blood Pressure(mmHg): 132/58 Body Mass Index(BMI): 24 Temperature(F): 97.9 Respiratory 18 Rate(breaths/min): Photos: [1:No Photos] [N/A:N/A] Wound Location: [1:Left Lower Leg - Medial] [N/A:N/A] Wounding Event: [1:Trauma] [N/A:N/A] Primary Etiology: [1:Trauma, Other] [N/A:N/A] Secondary Etiology: [1:Infection - not elsewhere  classified] [N/A:N/A] Comorbid History: [1:Cataracts, Congestive Heart N/A Failure, Coronary Artery Disease, Hypertension, Hepatitis A, Gout] Date Acquired: [1:01/18/2019] [N/A:N/A] Weeks of Treatment: [1:9] [N/A:N/A] Wound Status: [1:Open] [N/A:N/A] Clustered Wound: [1:Yes] [N/A:N/A] Clustered Quantity: [1:1] [N/A:N/A] Measurements L x W x D 1x0.1x0.1 [N/A:N/A] (cm) Area (cm) : [1:0.079] [N/A:N/A] Volume (cm) : [1:0.008] [N/A:N/A] % Reduction in Area: [1:99.80%] [N/A:N/A] % Reduction in Volume: [1:100.00%] [N/A:N/A] Classification: [1:Full Thickness Without Exposed Support Structures] [N/A:N/A] Exudate Amount: [1:Small] [N/A:N/A] Exudate Type: [1:Serous] [N/A:N/A] Exudate Color: [1:amber] [N/A:N/A] Wound Margin: [1:Flat and Intact] [N/A:N/A] Granulation Amount: [1:Large (67-100%)] [N/A:N/A] Granulation Quality: [1:Red] [N/A:N/A] Necrotic Amount: [1:None Present (0%)] [N/A:N/A] Exposed Structures: [1:Fat Layer  (Subcutaneous N/A Tissue) Exposed: Yes Fascia: No Tendon: No Muscle: No Joint: No Bone: No] Epithelialization: [1:Large (67-100%) Compression Therapy] [N/A:N/A N/A] Treatment Notes Wound #1 (Left, Medial Lower Leg) 1. Cleanse With Wound Cleanser Soap and water 2. Periwound Care Moisturizing lotion 3. Primary Dressing Applied Hydrofera Blue 4. Secondary Dressing ABD Pad 6. Support Layer Applied 3 layer compression Water quality scientist) Signed: 04/23/2019 5:29:05 PM By: Linton Ham MD Signed: 04/24/2019 8:57:53 AM By: [name] RN Entered By: Linton Ham on 04/23/2019 11:05:52 -------------------------------------------------------------------------------- Multi-Disciplinary Care Plan Details Patient Name: Date of Service: CATHY, ROPP 04/23/2019 9:15 AM Medical Record [id number] Patient Account Number: [id number] Date of Birth/Sex: Treating RN: 1940-05-07 (78 y.o. [name]) [name] Primary Care Lorana Maffeo: Yaakov Guthrie Other Clinician: Referring Raima Geathers: Treating Bridgette Wolden/Extender:Robson, Elie Goody, Chevis Pretty in Treatment: 9 Active Inactive Wound/Skin Impairment Nursing Diagnoses: Knowledge deficit related to ulceration/compromised skin integrity Goals: Patient/caregiver will verbalize understanding of skin care regimen Date Initiated: 02/19/2019 Date Inactivated: 03/26/2019 [place] Resolution Date: 03/22/2019 Goal Status: Met Ulcer/skin breakdown will have a volume reduction of 30% by week 4 Date Initiated: 02/19/2019 Date Inactivated: 03/26/2019 Target Resolution Date: 03/22/2019 Goal Status: Met Ulcer/skin breakdown will have a volume reduction of 50% by week 8 Date Initiated: 03/26/2019 Date Inactivated: 04/23/2019 Target Resolution Date: 04/19/2019 Goal Status: Met Ulcer/skin breakdown will have a volume reduction of 80% by week 12 Date Initiated: 04/23/2019 Target Resolution Date: 05/24/2019 Goal Status: Active Interventions: Assess  patient/caregiver ability to obtain necessary supplies Assess patient/caregiver ability to perform ulcer/skin care regimen upon admission and as needed Assess ulceration(s) every visit Notes: Electronic Signature(s) Signed: 04/24/2019 8:57:53 AM By: [name] RN Entered By: [name] on 04/23/2019 09:17:13 -------------------------------------------------------------------------------- Pain Assessment Details Patient Name: Date of Service: TERRAN, KLINKE 04/23/2019 [place] Patient Account Number: [id number] Date of Birth/Sex: Treating RN: 1940/08/31 (78 y.o. [name] Primary Care Baylin Gamblin: Yaakov Guthrie Other Clinician: Referring Cornesha Radziewicz: Treating Saydie Gerdts/Extender:Robson, Elie Goody, Chevis Pretty in Treatment: 9 Active Problems Location of Pain Severity and Description of Pain Patient Has Paino No Site Locations Pain Management and Medication Current Pain Management: Electronic Signature(s) Signed: 04/24/2019 8:57:53 AM By: [name] RN Signed: 05/22/2019 9:21:08 AM By: Sandre Kitty Entered By: Sandre Kitty on 04/23/2019 09:56:15 -------------------------------------------------------------------------------- Patient/Caregiver Education Details Patient Name: Date of Service: [name] 3/16/2021andnbsp9:15 AM Medical Record [phone] Patient Account Number: [id number] Date of Birth/Gender: [birth year] (79 y.o. M) Treating RN: [name] Primary Care Physician: Yaakov Guthrie Other Clinician: Referring Physician: Treating Physician/Extender:Robson, Elie Goody, Chevis Pretty in Treatment: 9 Education Assessment Education Provided To: Patient Education Topics Provided Wound/Skin Impairment: Methods: Explain/Verbal Responses: State content correctly Electronic Signature(s) Signed: 04/24/2019 8:57:53 AM By: [name] RN Entered By: [name] on 04/23/2019  09:17:29 -------------------------------------------------------------------------------- Wound Assessment Details Patient Name: Date of Service: KEMAR, PANDIT 04/23/2019 9:15 AM Medical Record  [id number] Patient Account Number: [id number] Date of Birth/Sex: Treating RN: September 02, 1940 (79 y.o. Ernestene Mention Primary Care Laster Appling: Yaakov Guthrie Other Clinician: Referring Naksh Radi: Treating Aneya Daddona/Extender:Robson, Elie Goody, Chevis Pretty in Treatment: 9 Wound Status Wound Number: 1 Primary Trauma, Other Etiology: Wound Location: Left Lower Leg - Medial Secondary Infection - not elsewhere classified Wounding Event: Trauma Etiology: Date Acquired: 01/18/2019 Wound Open Weeks Of Treatment: 9 Status: Clustered Wound: Yes Comorbid Cataracts, Congestive Heart Failure, History: Coronary Artery Disease, Hypertension, Hepatitis A, Gout Photos Photo Uploaded By: Mikeal Hawthorne on 04/24/2019 13:49:48 Wound Measurements Length: (cm) 1 % Reduct Width: (cm) 0.1 % Reduct Depth: (cm) 0.1 Epitheli Clustered Quantity: 1 Tunnelin Area: (cm) 0.079 Undermi Volume: (cm) 0.008 Wound Description Classification: Full Thickness Without Exposed Support Foul Odo Structures Slough/F Wound Flat and Intact Margin: Exudate Small Amount: Exudate Serous Type: Exudate amber Color: Wound Bed Granulation Amount: Large (67-100%) Granulation Quality: Red Fascia E Necrotic Amount: None Present (0%) Fat Layer Tendon Exp Muscle Exp Joint Expo Bone Expos r After Cleansing: No ibrino No Exposed Structure xposed: No (Subcutaneous Tissue) Exposed: Yes osed: No osed: No sed: No ed: No ion in Area: 99.8% ion in Volume: 100% alization: Large (67-100%) g: No ning: No Electronic Signature(s) Signed: 04/23/2019 5:21:02 PM By: Baruch Gouty RN, BSN Entered By: Baruch Gouty on 04/23/2019  10:04:19 -------------------------------------------------------------------------------- Vitals Details Patient Name: Date of Service: AQUILA, DELAUGHTER 04/23/2019 9:15 AM Medical Record [id number] Patient Account Number: [id number] Date of Birth/Sex: Treating RN: [birth year] (78 y.o. [name]) [name] Primary Care Peggie Hornak: Yaakov Guthrie Other Clinician: Referring Huan Pollok: Treating Verlyn Lambert/Extender:Robson, Elie Goody, Chevis Pretty in Treatment: 9 Vital Signs Time Taken: 09:55 Temperature (F): 97.9 Height (in): 71 Pulse (bpm): 62 Weight (lbs): 175 Respiratory Rate (breaths/min): 18 Body Mass Index (BMI): 24.4 Blood Pressure (mmHg): 132/58 Reference Range: 80 - 120 mg / dl Electronic Signature(s) Signed: 05/22/2019 9:21:08 AM By: Sandre Kitty Entered By: Sandre Kitty on 04/23/2019 09:56:09

## 2019-05-22 NOTE — Progress Notes (Signed)
[name], [name] ([id number]) Visit Report for 03/26/2019 Arrival Information Details Patient Name: Date of Service: [name], [name] 03/26/2019 2:30 PM Medical Record [place] Patient Account Number: [id number] Date of Birth/Sex: Treating RN: 08-Dec-1940 (79 y.o. [name]) Dolores Lory, Morey Hummingbird Primary Care Ormond Lazo: Yaakov Guthrie Other Clinician: Referring Beverlee Wilmarth: Treating Jolea Dolle/Extender:Robson, Elie Goody, Chevis Pretty in Treatment: 5 Visit Information History Since Last Visit Added or deleted any medications: No Patient Arrived: Gilford Rile Any new allergies or adverse reactions: No Arrival Time: 14:31 Had a fall or experienced change in No Accompanied By: self activities of daily living that may affect Transfer Assistance: None risk of falls: Patient Identification Verified: Yes Signs or symptoms of abuse/neglect since last No Secondary Verification Process Completed: Yes visito Patient Requires Transmission-Based No Hospitalized since last visit: No Precautions: Implantable device outside of the clinic excluding No Patient Has Alerts: Yes cellular tissue based products placed in the center Patient Alerts: left ABI since last visit: N/C Has Dressing in Place as Prescribed: Yes Pain Present Now: No Electronic Signature(s) Signed: 05/22/2019 9:23:26 AM By: Sandre Kitty Entered By: Sandre Kitty on 03/26/2019 14:31:43 -------------------------------------------------------------------------------- Clinic Level of Care Assessment Details Patient Name: Date of Service: [name], [name] 03/26/2019 2:30 PM Medical Record [place] Patient Account Number: [id number] Date of Birth/Sex: Treating RN: April 26, 1940 (79 y.o. [name]) [name] Primary Care Twanisha Foulk: Yaakov Guthrie Other Clinician: Referring Shahid Flori: Treating Tomasz Steeves/Extender:Robson, Elie Goody, Chevis Pretty in Treatment: 5 Clinic Level of Care Assessment Items TOOL 4 Quantity Score X - Use when only an  EandM is performed on FOLLOW-UP visit 1 0 ASSESSMENTS - Nursing Assessment / Reassessment X - Reassessment of Co-morbidities (includes updates in patient status) 1 10 X - Reassessment of Adherence to Treatment Plan 1 5 ASSESSMENTS - Wound and Skin Assessment / Reassessment X - Simple Wound Assessment / Reassessment - one wound 1 5 '[]'  - Complex Wound Assessment / Reassessment - multiple wounds 0 '[]'  - Dermatologic / Skin Assessment (not related to wound area) 0 ASSESSMENTS - Focused Assessment '[]'  - Circumferential Edema Measurements - multi extremities 0 '[]'  - Nutritional Assessment / Counseling / Intervention 0 '[]'  - Lower Extremity Assessment (monofilament, tuning fork, pulses) 0 '[]'  - Peripheral Arterial Disease Assessment (using hand held doppler) 0 ASSESSMENTS - Ostomy and/or Continence Assessment and Care '[]'  - Incontinence Assessment and Management 0 '[]'  - Ostomy Care Assessment and Management (repouching, etc.) 0 PROCESS - Coordination of Care X - Simple Patient / Family Education for ongoing care 1 15 '[]'  - Complex (extensive) Patient / Family Education for ongoing care 0 X - Staff obtains Consents, Records, Test Results / Process Orders 1 10 '[]'  - Staff telephones HHA, Nursing Homes / Clarify orders / etc 0 '[]'  - Routine Transfer to another Facility (non-emergent condition) 0 '[]'  - Routine Hospital Admission (non-emergent condition) 0 '[]'  - New Admissions / Biomedical engineer / Ordering NPWT, Apligraf, etc. 0 '[]'  - Emergency Hospital Admission (emergent condition) 0 X - Simple Discharge Coordination 1 10 '[]'  - Complex (extensive) Discharge Coordination 0 PROCESS - Special Needs '[]'  - Pediatric / Minor Patient Management 0 '[]'  - Isolation Patient Management 0 '[]'  - Hearing / Language / Visual special needs 0 '[]'  - Assessment of Community assistance (transportation, D/C planning, etc.) 0 '[]'  - Additional assistance / Altered mentation 0 '[]'  - Support Surface(s) Assessment (bed, cushion,  seat, etc.) 0 INTERVENTIONS - Wound Cleansing / Measurement X - Simple Wound Cleansing - one wound 1 5 '[]'  - Complex Wound Cleansing - multiple wounds 0 '[]'  - Wound Imaging (  photographs - any number of wounds) 0 '[]'  - Wound Tracing (instead of photographs) 0 X - Simple Wound Measurement - one wound 1 5 '[]'  - Complex Wound Measurement - multiple wounds 0 INTERVENTIONS - Wound Dressings '[]'  - Small Wound Dressing one or multiple wounds 0 '[]'  - Medium Wound Dressing one or multiple wounds 0 X - Large Wound Dressing one or multiple wounds 1 20 X - Application of Medications - topical 1 5 '[]'  - Application of Medications - injection 0 INTERVENTIONS - Miscellaneous '[]'  - External ear exam 0 '[]'  - Specimen Collection (cultures, biopsies, blood, body fluids, etc.) 0 '[]'  - Specimen(s) / Culture(s) sent or taken to Lab for analysis 0 '[]'  - Patient Transfer (multiple staff / Civil Service fast streamer / Similar devices) 0 '[]'  - Simple Staple / Suture removal (25 or less) 0 '[]'  - Complex Staple / Suture removal (26 or more) 0 '[]'  - Hypo / Hyperglycemic Management (close monitor of Blood Glucose) 0 '[]'  - Ankle / Brachial Index (ABI) - do not check if billed separately 0 X - Vital Signs 1 5 Has the patient been seen at the hospital within the last three years: Yes Total Score: 95 Level Of Care: New/Established - Level 3 Electronic Signature(s) Signed: 03/27/2019 5:45:19 PM By: [name] RN Entered By: [name] on 03/26/2019 15:25:32 -------------------------------------------------------------------------------- Encounter Discharge Information Details Patient Name: Date of Service: [name], [name] 03/26/2019 2:30 PM Medical Record [id number] Patient Account Number: [id number] Date of Birth/Sex: Treating RN: September 01, 1940 (79 y.o. [name] Primary Care Edger Husain: Yaakov Guthrie Other Clinician: Referring Taray Normoyle: Treating Emilyanne Mcgough/Extender:Robson, Elie Goody, Chevis Pretty in Treatment: 5 Encounter  Discharge Information Items Discharge Condition: Stable Ambulatory Status: Ambulatory Discharge Destination: Home Transportation: Private Auto Accompanied By: self Schedule Follow-up Appointment: Yes Clinical Summary of Care: Patient Declined Electronic Signature(s) Signed: 03/26/2019 5:22:24 PM By: Kela Millin Entered By: Kela Millin on 03/26/2019 15:38:37 -------------------------------------------------------------------------------- Lower Extremity Assessment Details Patient Name: Date of Service: [name], [name] 03/26/2019 2:30 PM Medical Record [id number] Patient Account Number: [id number] Date of Birth/Sex: Treating RN: [birth year] (79 y.o. Ernestene Mention Primary Care Gilles Trimpe: Yaakov Guthrie Other Clinician: Referring Castella Lerner: Treating Phoebe Marter/Extender:Robson, Elie Goody, Chevis Pretty in Treatment: 5 Edema Assessment Assessed: [Left: No] [Right: No] Edema: [Left: N] [Right: o] Calf Left: Right: Point of Measurement: 32 cm From Medial Instep 32.3 cm cm Ankle Left: Right: Point of Measurement: 11 cm From Medial Instep 22.5 cm cm Vascular Assessment Pulses: Dorsalis Pedis Palpable: [Left:Yes] Electronic Signature(s) Signed: 03/26/2019 5:24:43 PM By: Baruch Gouty RN, BSN Entered By: Baruch Gouty on 03/26/2019 14:43:09 -------------------------------------------------------------------------------- Multi Wound Chart Details Patient Name: Date of Service: [name], [name] 03/26/2019 2:30 PM Medical Record [id number] Patient Account Number: [id number] Date of Birth/Sex: Treating RN: 1940-06-20 (78 y.o. [name]) [name] Primary Care Lana Flaim: Yaakov Guthrie Other Clinician: Referring Sarha Bartelt: Treating Izabela Ow/Extender:Robson, Elie Goody, Chevis Pretty in Treatment: 5 Vital Signs Height(in): 71 Pulse(bpm): 72 Weight(lbs): 175 Blood Pressure(mmHg): 128/71 Body Mass Index(BMI): 24 Temperature(F): 98.0 Respiratory  18 Rate(breaths/min): Photos: [1:No Photos] [N/A:N/A] Wound Location: [1:Left Lower Leg] [N/A:N/A] Wounding Event: [1:Trauma] [N/A:N/A] Primary Etiology: [1:Trauma, Other] [N/A:N/A] Secondary Etiology: [1:Infection - not elsewhere classified] [N/A:N/A] Comorbid History: [1:Cataracts, Congestive Heart N/A Failure, Coronary Artery Disease, Hypertension, Hepatitis A, Gout] Date Acquired: [1:01/18/2019] [N/A:N/A] Weeks of Treatment: [1:5] [N/A:N/A] Wound Status: [1:Open] [N/A:N/A] Measurements L x W x D 4x1x0.1 [N/A:N/A] (cm) Area (cm) : [1:3.142] [N/A:N/A] Volume (cm) : [1:0.314] [N/A:N/A] % Reduction in Area: [1:90.90%] [N/A:N/A] % Reduction in Volume: 99.10% [N/A:N/A] Classification: [1:Full  Thickness Without Exposed Support Structures] [N/A:N/A] Exudate Amount: [1:Medium] [N/A:N/A] Exudate Type: [1:Serosanguineous] [N/A:N/A] Exudate Color: [1:red, brown] [N/A:N/A] Wound Margin: [1:Flat and Intact] [N/A:N/A] Granulation Amount: [1:Large (67-100%)] [N/A:N/A] Granulation Quality: [1:Red, Hyper-granulation] [N/A:N/A] Necrotic Amount: [1:None Present (0%)] [N/A:N/A] Exposed Structures: [1:Fat Layer (Subcutaneous Tissue) Exposed: Yes Fascia: No Tendon: No Muscle: No Joint: No Bone: No Small (1-33%)] [N/A:N/A N/A] Treatment Notes Electronic Signature(s) Signed: 03/26/2019 5:18:57 PM By: Linton Ham MD Signed: 03/27/2019 5:45:19 PM By: [name] RN Entered By: Linton Ham on 03/26/2019 15:23:24 -------------------------------------------------------------------------------- Multi-Disciplinary Care Plan Details Patient Name: Date of Service: [name], [name] 03/26/2019 2:30 PM Medical Record [id number] Patient Account Number: [id number] Date of Birth/Sex: Treating RN: 10/19/1940 (78 y.o. [name] Primary Care Marrianne Sica: Yaakov Guthrie Other Clinician: Referring Jadd Gasior: Treating Leinani Lisbon/Extender:Robson, Elie Goody, Chevis Pretty in Treatment: 5 Active  Inactive Wound/Skin Impairment Nursing Diagnoses: Knowledge deficit related to ulceration/compromised skin integrity Goals: Patient/caregiver will verbalize understanding of skin care regimen Date Initiated: 02/19/2019 Date Inactivated: 03/26/2019 Target Resolution Date: 03/22/2019 Goal Status: Met Ulcer/skin breakdown will have a volume reduction of 30% by week 4 Date Initiated: 02/19/2019 Date Inactivated: 03/26/2019 Target Resolution Date: 03/22/2019 Goal Status: Met Ulcer/skin breakdown will have a volume reduction of 50% by week 8 Date Initiated: 03/26/2019 Target Resolution Date: 04/19/2019 Goal Status: Active Interventions: Assess patient/caregiver ability to obtain necessary supplies Assess patient/caregiver ability to perform ulcer/skin care regimen upon admission and as needed Assess ulceration(s) every visit Notes: Electronic Signature(s) Signed: 03/27/2019 5:45:19 PM By: [name] RN Entered By: [name] on 03/26/2019 15:07:21 -------------------------------------------------------------------------------- Pain Assessment Details Patient Name: Date of Service: [name], [name] 03/26/2019 2:30 PM Medical Record [place] Patient Account Number: [id number] Date of Birth/Sex: Treating RN: 03/11/1940 (78 y.o. [name] Primary Care Deaunte Dente: Yaakov Guthrie Other Clinician: Referring Denica Web: Treating Tykera Skates/Extender:Robson, Elie Goody, Chevis Pretty in Treatment: 5 Active Problems Location of Pain Severity and Description of Pain Patient Has Paino No Site Locations Rate the pain. Current Pain Level: 0 Pain Management and Medication Current Pain Management: Electronic Signature(s) Signed: 03/26/2019 5:24:43 PM By: Baruch Gouty RN, BSN Signed: 03/27/2019 5:45:19 PM By: [name] RN Entered By: Baruch Gouty on 03/26/2019 14:39:47 -------------------------------------------------------------------------------- Patient/Caregiver Education  Details Patient Name: Date of Service: [name] 2/16/2021andnbsp2:30 PM Medical Record [phone] Patient Account Number: [id number] Date of Birth/Gender: 22-Aug-1940 (78 y.o. M) Treating RN: [name] Primary Care Physician: Yaakov Guthrie Other Clinician: Referring Physician: Treating Physician/Extender:Robson, Elie Goody, Chevis Pretty in Treatment: 5 Education Assessment Education Provided To: Patient Education Topics Provided Wound/Skin Impairment: Methods: Explain/Verbal Responses: State content correctly Electronic Signature(s) Signed: 03/27/2019 5:45:19 PM By: [name] RN Entered By: [name] on 03/26/2019 15:07:32 -------------------------------------------------------------------------------- Wound Assessment Details Patient Name: Date of Service: [name], [name] 03/26/2019 2:30 PM Medical Record [facility] Patient Account Number: [id number] Date of Birth/Sex: Treating RN: 1940/08/30 (78 y.o. [name]) [name] Primary Care Adelee Hannula: Yaakov Guthrie Other Clinician: Referring Rosamond Andress: Treating Biana Haggar/Extender:Robson, Elie Goody, Chevis Pretty in Treatment: 5 Wound Status Wound Number: 1 Primary Trauma, Other Etiology: Wound Location: Left Lower Leg Secondary Infection - not elsewhere classified Wounding Event: Trauma Etiology: Date Acquired: 01/18/2019 Wound Open Weeks Of Treatment: 5 Status: Clustered Wound: No Comorbid Cataracts, Congestive Heart Failure, History: Coronary Artery Disease, Hypertension, Hepatitis A, Gout Photos Wound Measurements Length: (cm) 4 % Reduct Width: (cm) 1 % Reduct Depth: (cm) 0.1 Epitheli Area: (cm) 3.142 Tunneli Volume: (cm) 0.314 Undermi Wound Description Classification: Full Thickness Without Exposed Support Foul Odo Structures Slough/F Wound Flat and Intact Margin: Exudate  Medium Amount: Exudate Serosanguineous Type: Exudate red, brown Color: Wound Bed Granulation Amount: Large  (67-100%) Granulation Quality: Red, Hyper-granulation Fascia E Necrotic Amount: None Present (0%) Fat Laye Tendon E Muscle E Joint Ex Bone Exp r After Cleansing: No ibrino Yes Exposed Structure xposed: No r (Subcutaneous Tissue) Exposed: Yes xposed: No xposed: No posed: No osed: No ion in Area: 90.9% ion in Volume: 99.1% alization: Small (1-33%) ng: No ning: No Electronic Signature(s) Signed: 03/27/2019 4:27:12 PM By: Mikeal Hawthorne EMT/HBOT Signed: 03/27/2019 5:45:19 PM By: [name] RN Previous Signature: 03/26/2019 5:24:43 PM Version By: Baruch Gouty RN, BSN Entered By: Mikeal Hawthorne on 03/27/2019 15:22:30 -------------------------------------------------------------------------------- Vitals Details Patient Name: Date of Service: [name], [name] 03/26/2019 2:30 PM Medical Record [id number] Patient Account Number: [id number] Date of Birth/Sex: Treating RN: [birth year] (78 y.o. [name]) Dolores Lory, [place] Primary Care Ellijah Leffel: Yaakov Guthrie Other Clinician: Referring Bliss Behnke: Treating Carlie Corpus/Extender:Robson, Elie Goody, Chevis Pretty in Treatment: 5 Vital Signs Time Taken: 14:31 Temperature (F): 98.0 Height (in): 71 Pulse (bpm): 72 Weight (lbs): 175 Respiratory Rate (breaths/min): 18 Body Mass Index (BMI): 24.4 Blood Pressure (mmHg): 128/71 Reference Range: 80 - 120 mg / dl Electronic Signature(s) Signed: 05/22/2019 9:23:26 AM By: Sandre Kitty Entered By: Sandre Kitty on 03/26/2019 14:33:12

## 2019-05-22 NOTE — Progress Notes (Signed)
TREBOR, [name] ([id number]) Visit Report for 04/30/2019 Arrival Information Details Patient Name: Date of Service: KHI, MCMILLEN 04/30/2019 10:15 AM Medical Record [id number] Patient Account Number: [id number] Date of Birth/Sex: Treating RN: January 18, 1941 (79 y.o. [name]) Dolores Lory, Morey Hummingbird Primary Care Nita Whitmire: Yaakov Guthrie Other Clinician: Referring Chevella Pearce: Treating Ezell Melikian/Extender:Robson, Elie Goody, Chevis Pretty in Treatment: 10 Visit Information History Since Last Visit Added or deleted any medications: No Patient Arrived: Kasandra [name] Any new allergies or adverse reactions: No Arrival Time: 10:22 Had a fall or experienced change in No Accompanied By: self activities of daily living that may affect Transfer Assistance: None risk of falls: Patient Identification Verified: Yes Signs or symptoms of abuse/neglect since last No Secondary Verification Process Completed: Yes visito Patient Requires Transmission-Based No Hospitalized since last visit: No Precautions: Implantable device outside of the clinic excluding No Patient Has Alerts: Yes cellular tissue based products placed in the center Patient Alerts: left ABI since last visit: N/C Has Dressing in Place as Prescribed: Yes Pain Present Now: No Electronic Signature(s) Signed: 05/22/2019 9:21:08 AM By: Sandre Kitty Entered By: Sandre Kitty on 04/30/2019 10:22:21 -------------------------------------------------------------------------------- Compression Therapy Details Patient Name: Date of Service: Jesse, [name] 04/30/2019 10:15 AM Medical Record [id number] Patient Account Number: [id number] Date of Birth/Sex: Treating RN: [birth year] (79 y.o. [name] Primary Care Shonica Weier: Yaakov Guthrie Other Clinician: Referring Angelita Harnack: Treating Rosamary Boudreau/Extender:Robson, Elie Goody, Chevis Pretty in Treatment: 10 Compression Therapy Performed for Wound Wound #1 Left,Medial Lower Leg Assessment: Performed  By: Jake Church, RN Compression Type: Three Layer Post Procedure Diagnosis Same as Pre-procedure Electronic Signature(s) Signed: 04/30/2019 5:59:23 PM By: [name] RN Entered By: [name] on 04/30/2019 11:15:42 -------------------------------------------------------------------------------- Encounter Discharge Information Details Patient Name: Date of Service: BALTAZAR, [name] 04/30/2019 10:15 AM Medical Record [id number] Patient Account Number: [id number] Date of Birth/Sex: Treating RN: 1940-09-14 (79 y.o. [name] Primary Care Shanetta Nicolls: Yaakov Guthrie Other Clinician: Referring Sophiagrace Benbrook: Treating Rebbecca Osuna/Extender:Robson, Elie Goody, Chevis Pretty in Treatment: 10 Encounter Discharge Information Items Discharge Condition: Stable Ambulatory Status: Cane Discharge Destination: Home Transportation: Other Accompanied By: self Schedule Follow-up Appointment: Yes Clinical Summary of Care: Patient Declined Electronic Signature(s) Signed: 04/30/2019 5:48:19 PM By: Kela Millin Entered By: Kela Millin on 04/30/2019 11:31:12 -------------------------------------------------------------------------------- Lower Extremity Assessment Details Patient Name: Date of Service: [name], Jesse 04/30/2019 10:15 AM Medical Record [id number] Patient Account Number: [id number] Date of Birth/Sex: Treating RN: February 25, 1940 (79 y.o. [name] Primary Care Danelia Snodgrass: Yaakov Guthrie Other Clinician: Referring Lucindia Lemley: Treating Nathan Moctezuma/Extender:Robson, Elie Goody, Chevis Pretty in Treatment: 10 Edema Assessment Assessed: [Left: No] [Right: No] Edema: [Left: N] [Right: o] Calf Left: Right: Point of Measurement: 32 cm From Medial Instep 30.7 cm cm Ankle Left: Right: Point of Measurement: 11 cm From Medial Instep 21.4 cm cm Vascular Assessment Pulses: Dorsalis Pedis Palpable: [Left:Yes] Electronic Signature(s) Signed: 05/22/2019 9:02:47  AM By: Levan Hurst RN, BSN Entered By: Levan Hurst on 04/30/2019 10:43:16 -------------------------------------------------------------------------------- Multi Wound Chart Details Patient Name: Date of Service: Jesse, [name] 04/30/2019 10:15 AM Medical Record [id number] Patient Account Number: [id number] Date of Birth/Sex: Treating RN: 08-23-1940 (79 y.o. [name]) [name] Primary Care Eliany Mccarter: Yaakov Guthrie Other Clinician: Referring Harlan Vinal: Treating Jamesyn Moorefield/Extender:Robson, Elie Goody, Chevis Pretty in Treatment: 10 Vital Signs Height(in): 71 Pulse(bpm): 74 Weight(lbs): 175 Blood Pressure(mmHg): 152/71 Body Mass Index(BMI): 24 Temperature(F): 97.8 Respiratory 18 Rate(breaths/min): Photos: [1:No Photos] [N/A:N/A] Wound Location: [1:Left Lower Leg - Medial] [N/A:N/A] Wounding Event: [1:Trauma] [N/A:N/A] Primary Etiology: [1:Trauma, Other] [N/A:N/A] Secondary Etiology: [1:Infection - not elsewhere classified] [N/A:N/A] Comorbid History: [1:Cataracts, Congestive Heart  N/A Failure, Coronary Artery Disease, Hypertension, Hepatitis A, Gout] Date Acquired: [1:01/18/2019] [N/A:N/A] Weeks of Treatment: [1:10] [N/A:N/A] Wound Status: [1:Open] [N/A:N/A] Clustered Wound: [1:Yes] [N/A:N/A] Clustered Quantity: [1:1] [N/A:N/A] Measurements L x W x D 1x0.4x0.1 [N/A:N/A] (cm) Area (cm) : [1:0.314] [N/A:N/A] Volume (cm) : [1:0.031] [N/A:N/A] % Reduction in Area: [1:99.10%] [N/A:N/A] % Reduction in Volume: [1:99.90%] [N/A:N/A] Classification: [1:Full Thickness Without Exposed Support Structures] [N/A:N/A] Exudate Amount: [1:Small] [N/A:N/A] Exudate Type: [1:Serosanguineous] [N/A:N/A] Exudate Color: [1:red, brown] [N/A:N/A] Wound Margin: [1:Flat and Intact] [N/A:N/A] Granulation Amount: [1:Large (67-100%)] [N/A:N/A] Granulation Quality: [1:Red, Hyper-granulation] [N/A:N/A] Necrotic Amount: [1:None Present (0%)] [N/A:N/A] Exposed Structures: [1:Fat Layer  (Subcutaneous N/A Tissue) Exposed: Yes Fascia: No Tendon: No Muscle: No Joint: No Bone: No] Epithelialization: [1:Large (67-100%)] [N/A:N/A] Procedures Performed: [1:Chemical Cauterization Compression Therapy] [N/A:N/A] Treatment Notes Electronic Signature(s) Signed: 04/30/2019 5:53:09 PM By: Linton Ham MD Signed: 04/30/2019 5:59:23 PM By: [name] RN Entered By: Linton Ham on 04/30/2019 11:26:31 -------------------------------------------------------------------------------- Multi-Disciplinary Care Plan Details Patient Name: Date of Service: PRATIK, DALZIEL 04/30/2019 10:15 AM Medical Record [id number] Patient Account Number: [id number] Date of Birth/Sex: Treating RN: [birth year] (78 y.o. Staci Acosta, Morey Hummingbird Primary Care Zenaya Ulatowski: Yaakov Guthrie Other Clinician: Referring Donal Lynam: Treating Ronzell Laban/Extender:Robson, Elie Goody, Chevis Pretty in Treatment: 10 Active Inactive Wound/Skin Impairment Nursing Diagnoses: Knowledge deficit related to ulceration/compromised skin integrity Goals: Patient/caregiver will verbalize understanding of skin care regimen Date Initiated: 02/19/2019 Date Inactivated: 03/26/2019 Target Resolution Date: 03/22/2019 Goal Status: Met Ulcer/skin breakdown will have a volume reduction of 30% by week 4 Date Initiated: 02/19/2019 Date Inactivated: 03/26/2019 Target Resolution Date: 03/22/2019 Goal Status: Met Ulcer/skin breakdown will have a volume reduction of 50% by week 8 Date Initiated: 03/26/2019 Date Inactivated: 04/23/2019 Target Resolution Date: 04/19/2019 Goal Status: Met Ulcer/skin breakdown will have a volume reduction of 80% by week 12 Date Initiated: 04/23/2019 Target Resolution Date: 05/24/2019 Goal Status: Active Interventions: Assess patient/caregiver ability to obtain necessary supplies Assess patient/caregiver ability to perform ulcer/skin care regimen upon admission and as needed Assess ulceration(s) every  visit Notes: Electronic Signature(s) Signed: 04/30/2019 5:59:23 PM By: [name] RN Entered By: [name] on 04/30/2019 11:02:13 -------------------------------------------------------------------------------- Pain Assessment Details Patient Name: Date of Service: DONAVON, KIMREY 04/30/2019 10:15 AM Medical Record Number:[id number] Patient Account Number: [id number] Date of Birth/Sex: Treating RN: 1940-06-11 (78 y.o. [name] Primary Care Arloa Prak: Yaakov Guthrie Other Clinician: Referring Keaton Stirewalt: Treating Aaren Atallah/Extender:Robson, Elie Goody, Chevis Pretty in Treatment: 10 Active Problems Location of Pain Severity and Description of Pain Patient Has Paino No Site Locations Pain Management and Medication Current Pain Management: Electronic Signature(s) Signed: 04/30/2019 5:59:23 PM By: [name] RN Signed: 05/22/2019 9:21:08 AM By: Sandre Kitty Entered By: Sandre Kitty on 04/30/2019 10:24:56 -------------------------------------------------------------------------------- Patient/Caregiver Education Details Patient Name: Date of Service: [name] 3/23/2021andnbsp10:15 AM Medical Record [phone] Patient Account Number: [id number] Date of Birth/Gender: July 29, 1940 (79 y.o. M) Treating RN: [name] Primary Care Physician: Yaakov Guthrie Other Clinician: Referring Physician: Treating Physician/Extender:Robson, Elie Goody, Chevis Pretty in Treatment: 10 Education Assessment Education Provided To: Patient Education Topics Provided Wound/Skin Impairment: Methods: Explain/Verbal Responses: State content correctly Electronic Signature(s) Signed: 04/30/2019 5:59:23 PM By: [name] RN Entered By: [name] on 04/30/2019 11:02:34 -------------------------------------------------------------------------------- Wound Assessment Details Patient Name: Date of Service: FAIZON, CAPOZZI 04/30/2019 10:15 AM Medical Record Number:[id number]  Patient Account Number: [id number] Date of Birth/Sex: Treating RN: Mar 28, 1940 (79 y.o. [name] Primary Care Valleri Hendricksen: Yaakov Guthrie Other Clinician: Referring Hermila Millis: Treating Alayasia Breeding/Extender:Robson, Elie Goody, Chevis Pretty in Treatment: 10 Wound Status Wound Number: 1  Primary Trauma, Other Etiology: Wound Location: Left Lower Leg - Medial Secondary Infection - not elsewhere classified Wounding Event: Trauma Etiology: Date Acquired: 01/18/2019 Wound Open Weeks Of Treatment: 10 Status: Status: Clustered Wound: Yes Comorbid Cataracts, Congestive Heart Failure, History: Coronary Artery Disease, Hypertension, Hepatitis A, Gout Photos Photo Uploaded By: Mikeal Hawthorne on 05/03/2019 15:11:56 Wound Measurements Length: (cm) 1 % Reduct Width: (cm) 0.4 % Reduct Depth: (cm) 0.1 Epitheli Clustered Quantity: 1 Tunnelin Area: (cm) 0.314 Undermi Volume: (cm) 0.031 Wound Description Full Thickness Without Exposed Support Foul Od Classification: Structures Slough/ Wound Flat and Intact Margin: Exudate Small Amount: Exudate Serosanguineous Type: Exudate red, brown Color: Wound Bed Granulation Amount: Large (67-100%) Granulation Quality: Red, Hyper-granulation Fascia E Necrotic Amount: None Present (0%) Fat Laye Tendon E Muscle E Joint Ex Bone Exp or After Cleansing: No Fibrino No Exposed Structure xposed: No r (Subcutaneous Tissue) Exposed: Yes xposed: No xposed: No posed: No osed: No ion in Area: 99.1% ion in Volume: 99.9% alization: Large (67-100%) g: No ning: No Electronic Signature(s) Signed: 05/22/2019 9:02:47 AM By: Levan Hurst RN, BSN Entered By: Levan Hurst on 04/30/2019 10:43:37 -------------------------------------------------------------------------------- Vitals Details Patient Name: Date of Service: MACIEJ, SCHWEITZER 04/30/2019 10:15 AM Medical Record [id number] Patient Account Number: [id number] Date of  Birth/Sex: Treating RN: 09/11/1940 (78 y.o. [name]) [name] Primary Care Cortlan Dolin: Yaakov Guthrie Other Clinician: Referring Ramesh Moan: Treating Sherrol Vicars/Extender:Robson, Elie Goody, Chevis Pretty in Treatment: 10 Vital Signs Time Taken: 10:22 Temperature (F): 97.8 Height (in): 71 Pulse (bpm): 57 Weight (lbs): 175 Respiratory Rate (breaths/min): 18 Body Mass Index (BMI): 24.4 Blood Pressure (mmHg): 152/71 Reference Range: 80 - 120 mg / dl Electronic Signature(s) Signed: 05/22/2019 9:21:08 AM By: Sandre Kitty Entered By: Sandre Kitty on 04/30/2019 10:24:52

## 2019-06-20 IMAGING — DX DG CHEST 2V
2 series · 2 of 2 positions shown · non-contrast
Comparison: None.

CLINICAL DATA: Shortness of breath for 1-2 weeks.

EXAM:
CHEST - 2 VIEW

[chest pa]
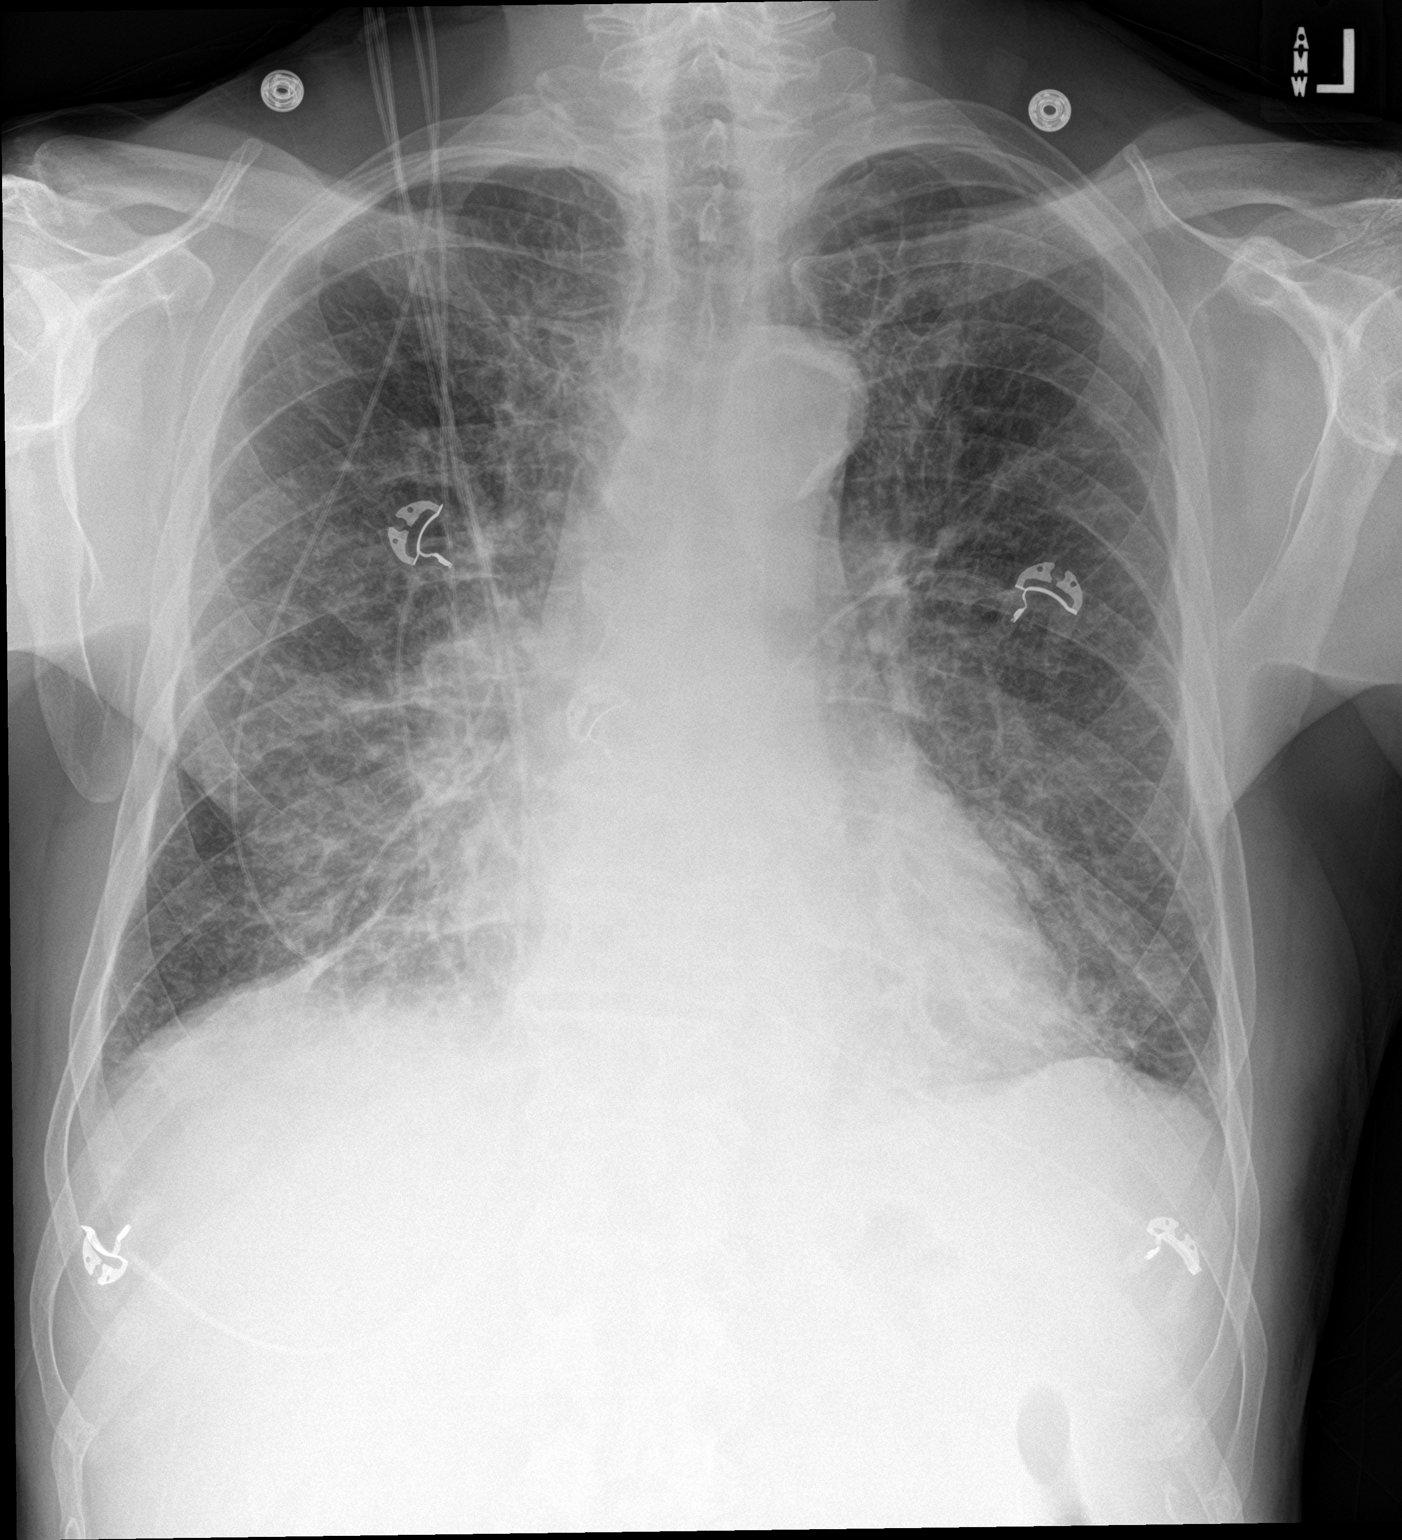

[chest lat]
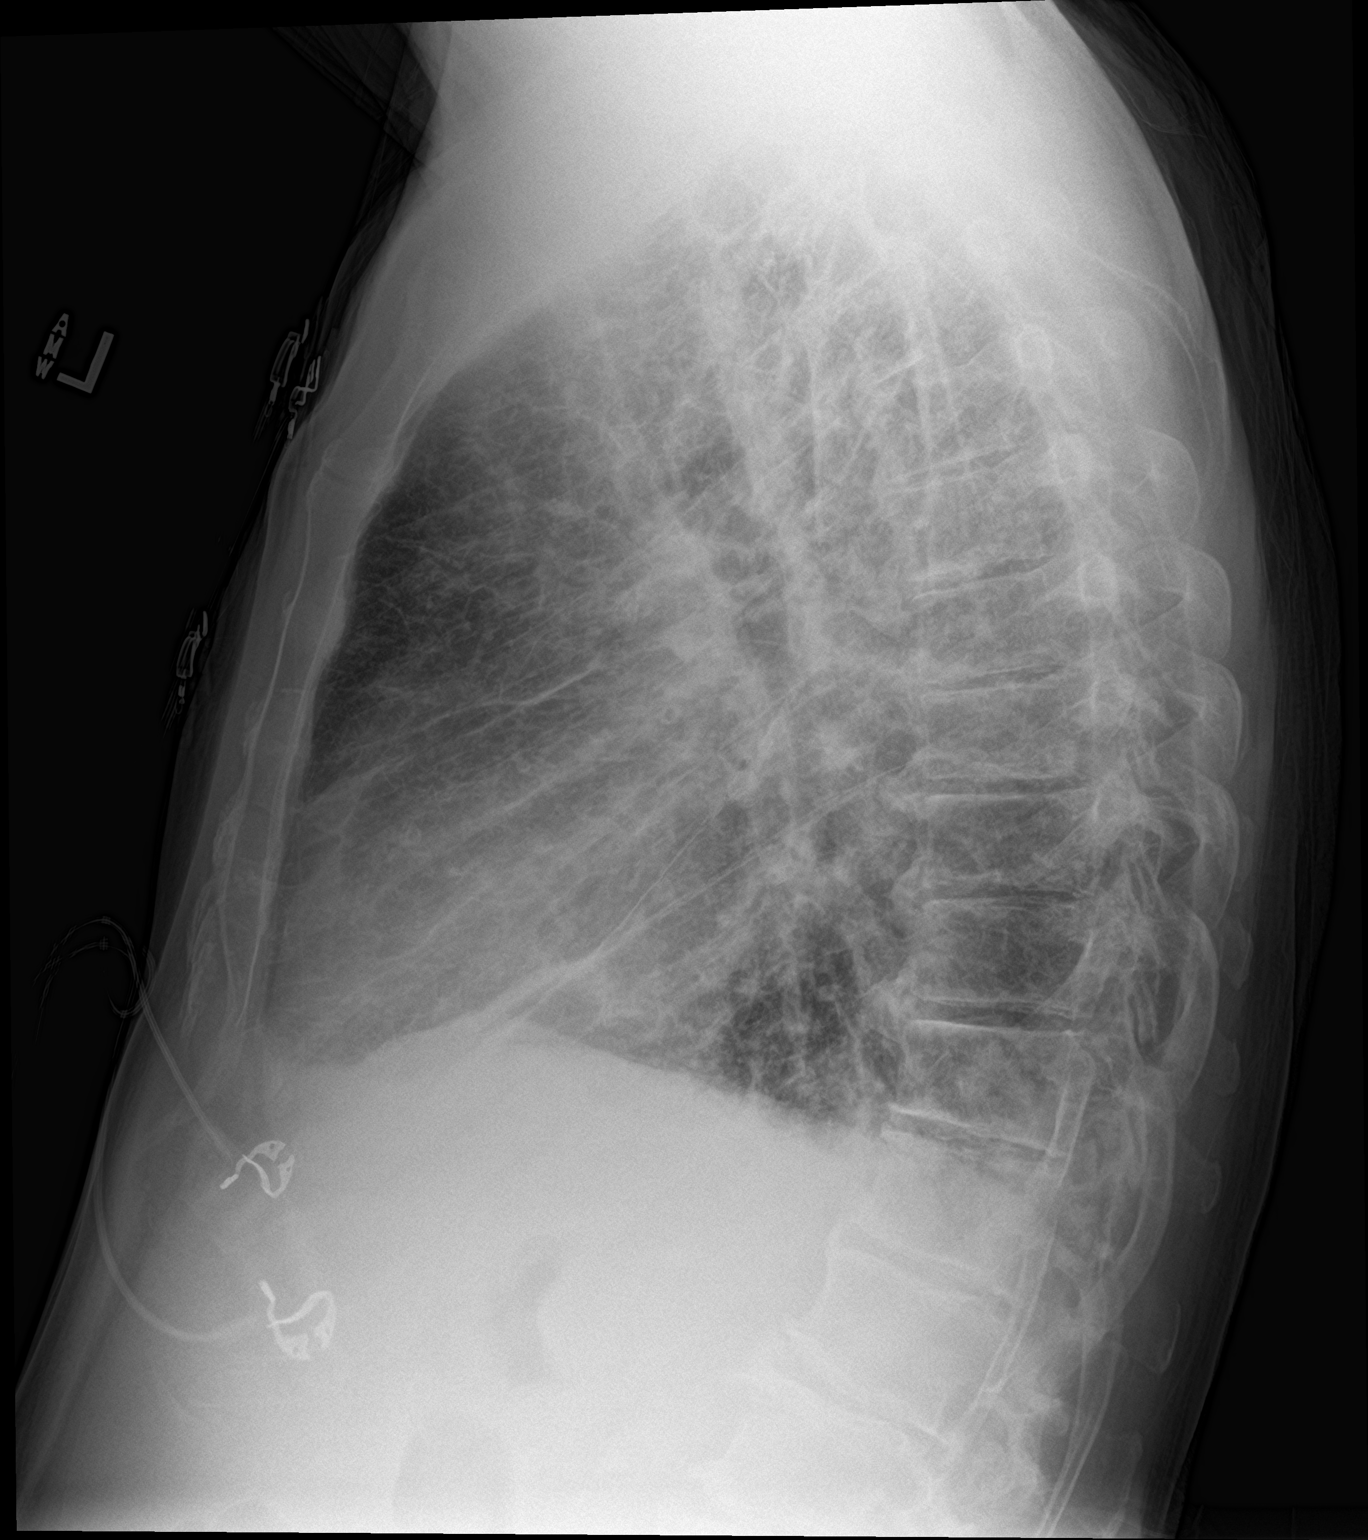

[2 of 2 positions shown; findings below may reference images not displayed]

FINDINGS: There is extensive pulmonary edema and trace bilateral pleural
effusions. Heart size is upper normal. Extensive atherosclerotic
vascular disease is present. No pneumothorax. No acute bony
abnormality.
IMPRESSION: Congestive heart failure.

## 2019-07-02 IMAGING — CR DG CHEST 2V
2 series · 2 of 2 positions shown · non-contrast
Comparison: 07/08/2017

CLINICAL DATA: Preoperative evaluation for open heart surgery,
hypertension, CHF, former smoker

EXAM:
CHEST - 2 VIEW

[w chest pa]
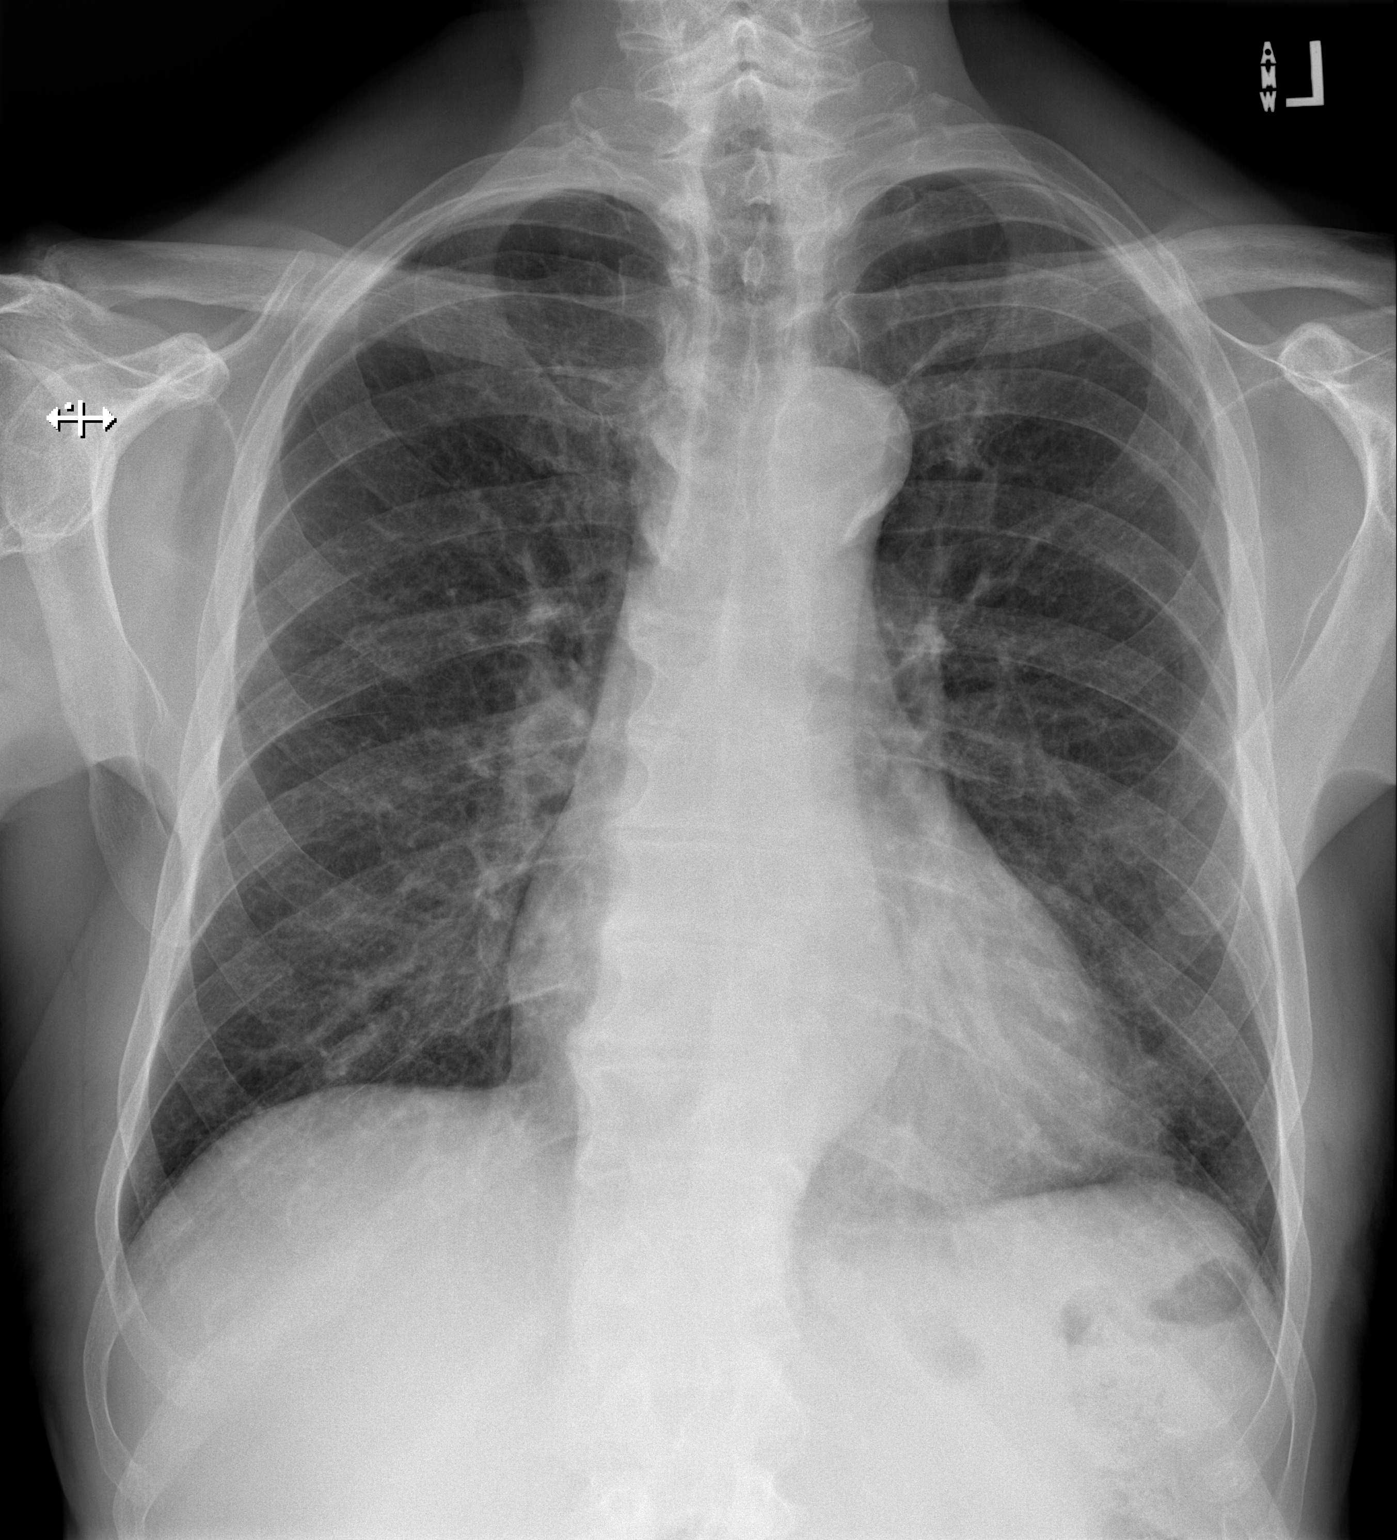

[w chest lat]
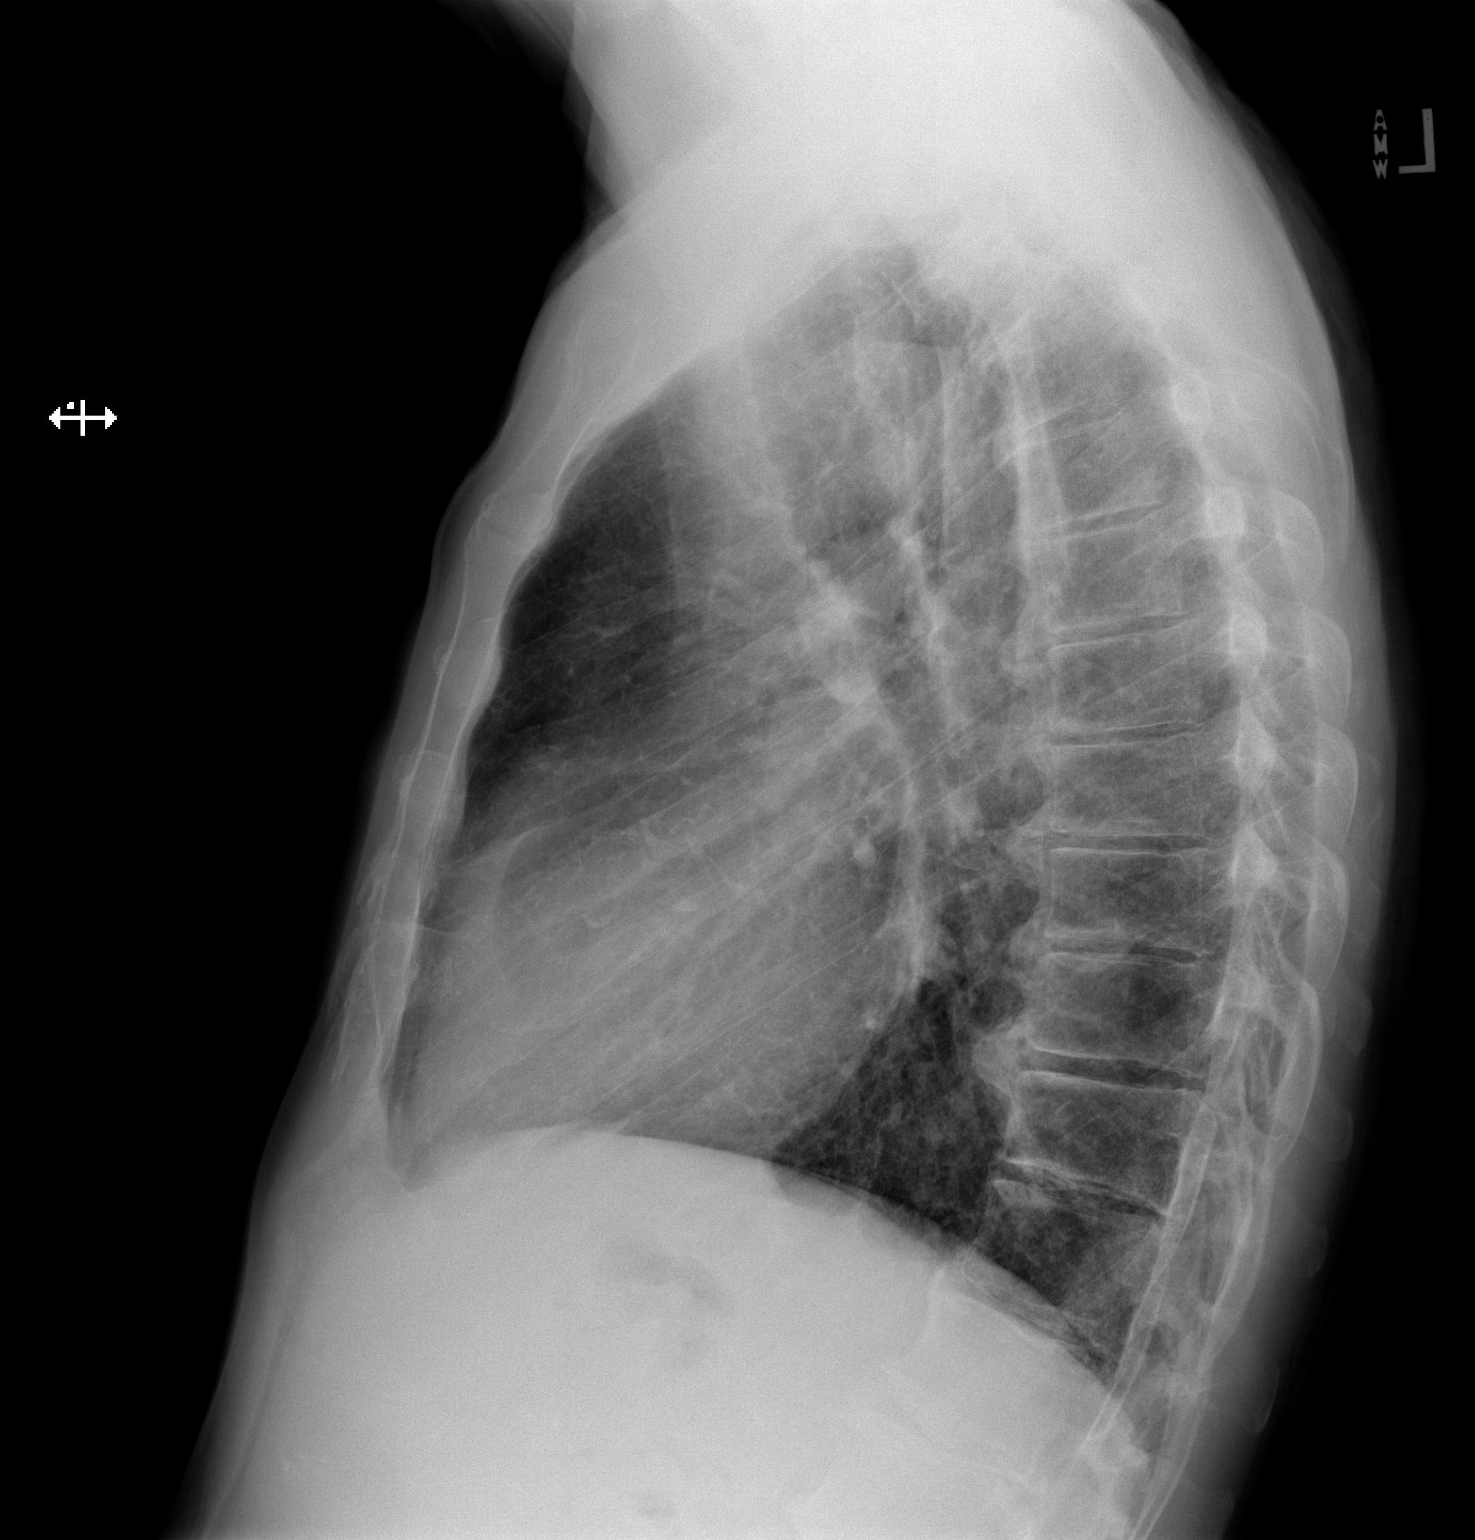

[2 of 2 positions shown; findings below may reference images not displayed]

FINDINGS: Enlargement of cardiac silhouette.

Mediastinal contours and pulmonary vascularity normal.

Atherosclerotic calcification aorta.

Lungs clear, with resolution of previously identified pulmonary
edema.

No segmental consolidation, pleural effusion or pneumothorax.

Scattered endplate spur formation thoracic spine.
IMPRESSION: Enlargement of cardiac silhouette.

Resolution of pulmonary edema/CHF seen on previous exam.

## 2019-07-07 IMAGING — DX DG CHEST 1V PORT
1 series · 1 of 1 positions shown · non-contrast
Comparison: 07/24/2017.

CLINICAL DATA: CABG

EXAM:
PORTABLE CHEST 1 VIEW

[chest ap]
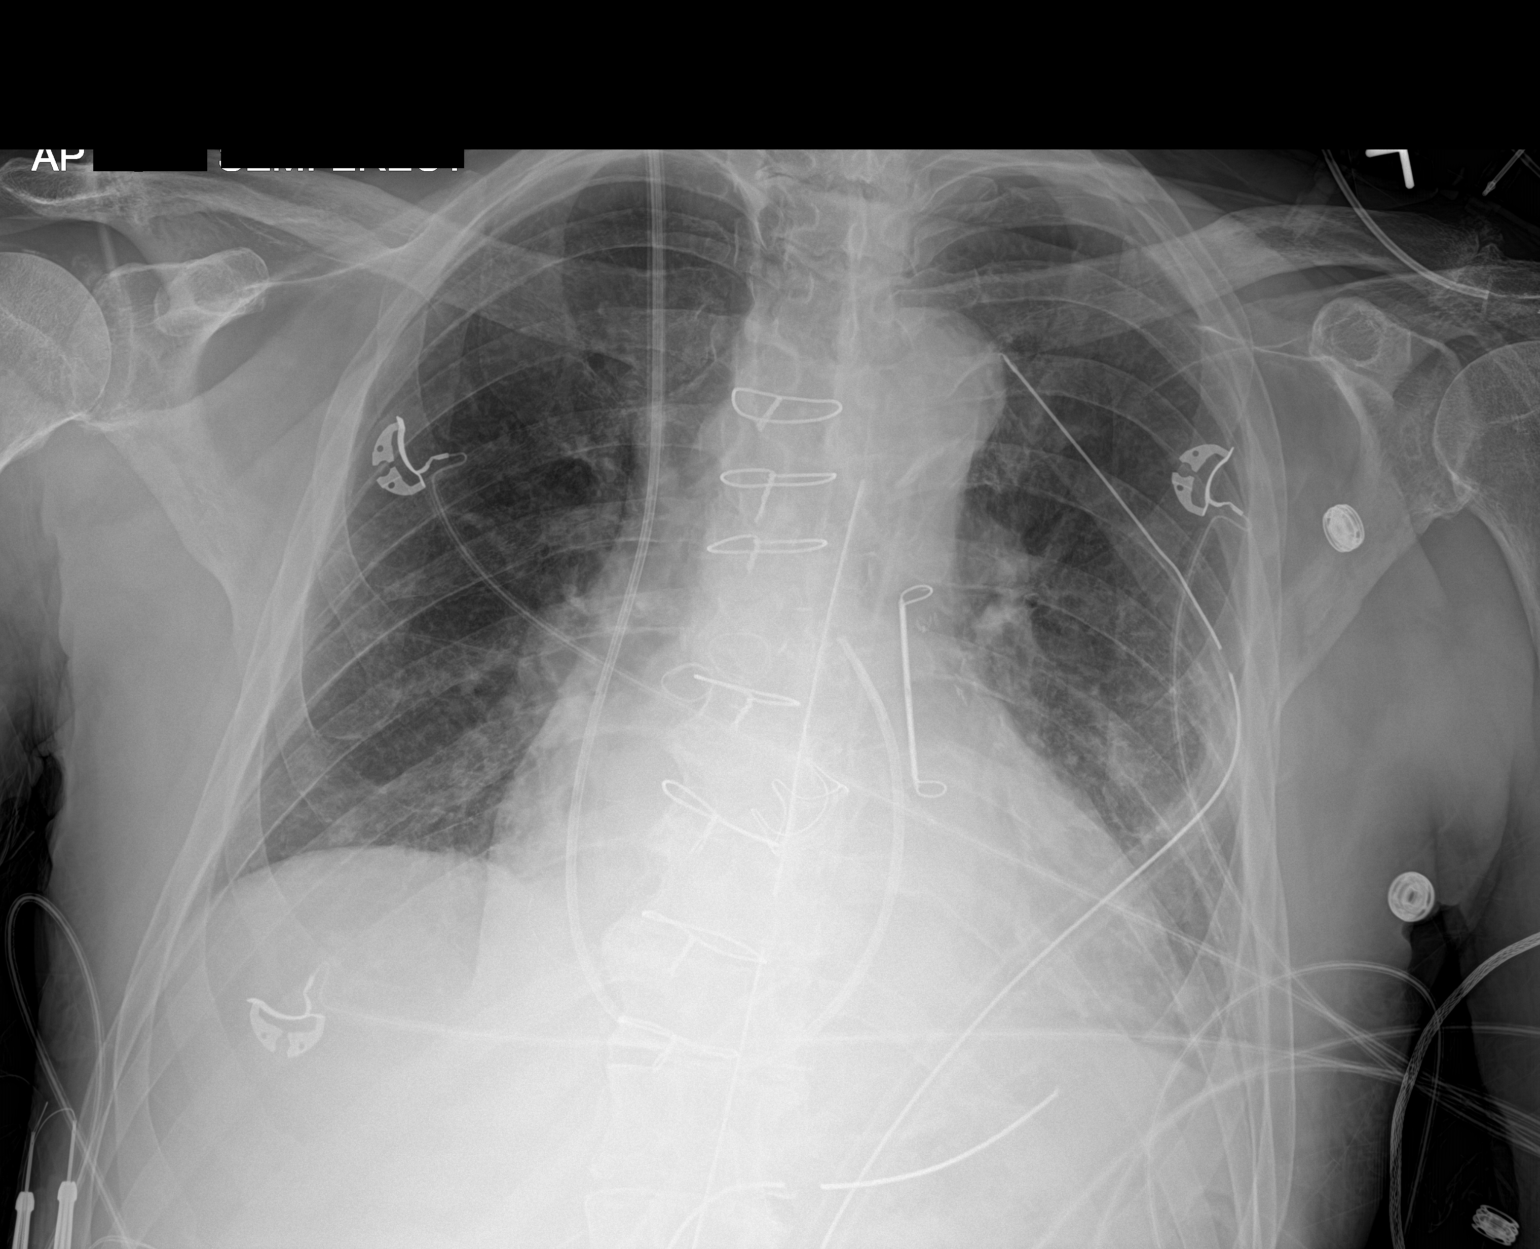

[1 of 1 positions shown; findings below may reference images not displayed]

FINDINGS: Prior CABG and cardiac valve replacement. Left atrial appendage clip
noted. Stable cardiomegaly. Interim extubation and removal of NG
tube. Mediastinal drainage catheters, Swan-Ganz catheter, left chest
tube in stable position. No pneumothorax. Mild left base
subsegmental atelectasis. Small left pleural effusion.
IMPRESSION: 1. Interim extubation removal of NG tube. Remaining lines and tubes
including left chest tube in stable position. No pneumothorax.

2. Mild left base subsegmental atelectasis and small left pleural
effusion.

## 2019-07-08 IMAGING — DX DG CHEST 1V PORT
1 series · 1 of 1 positions shown · non-contrast
Comparison: 07/25/2017

CLINICAL DATA: CABG

EXAM:
PORTABLE CHEST 1 VIEW

[chest]
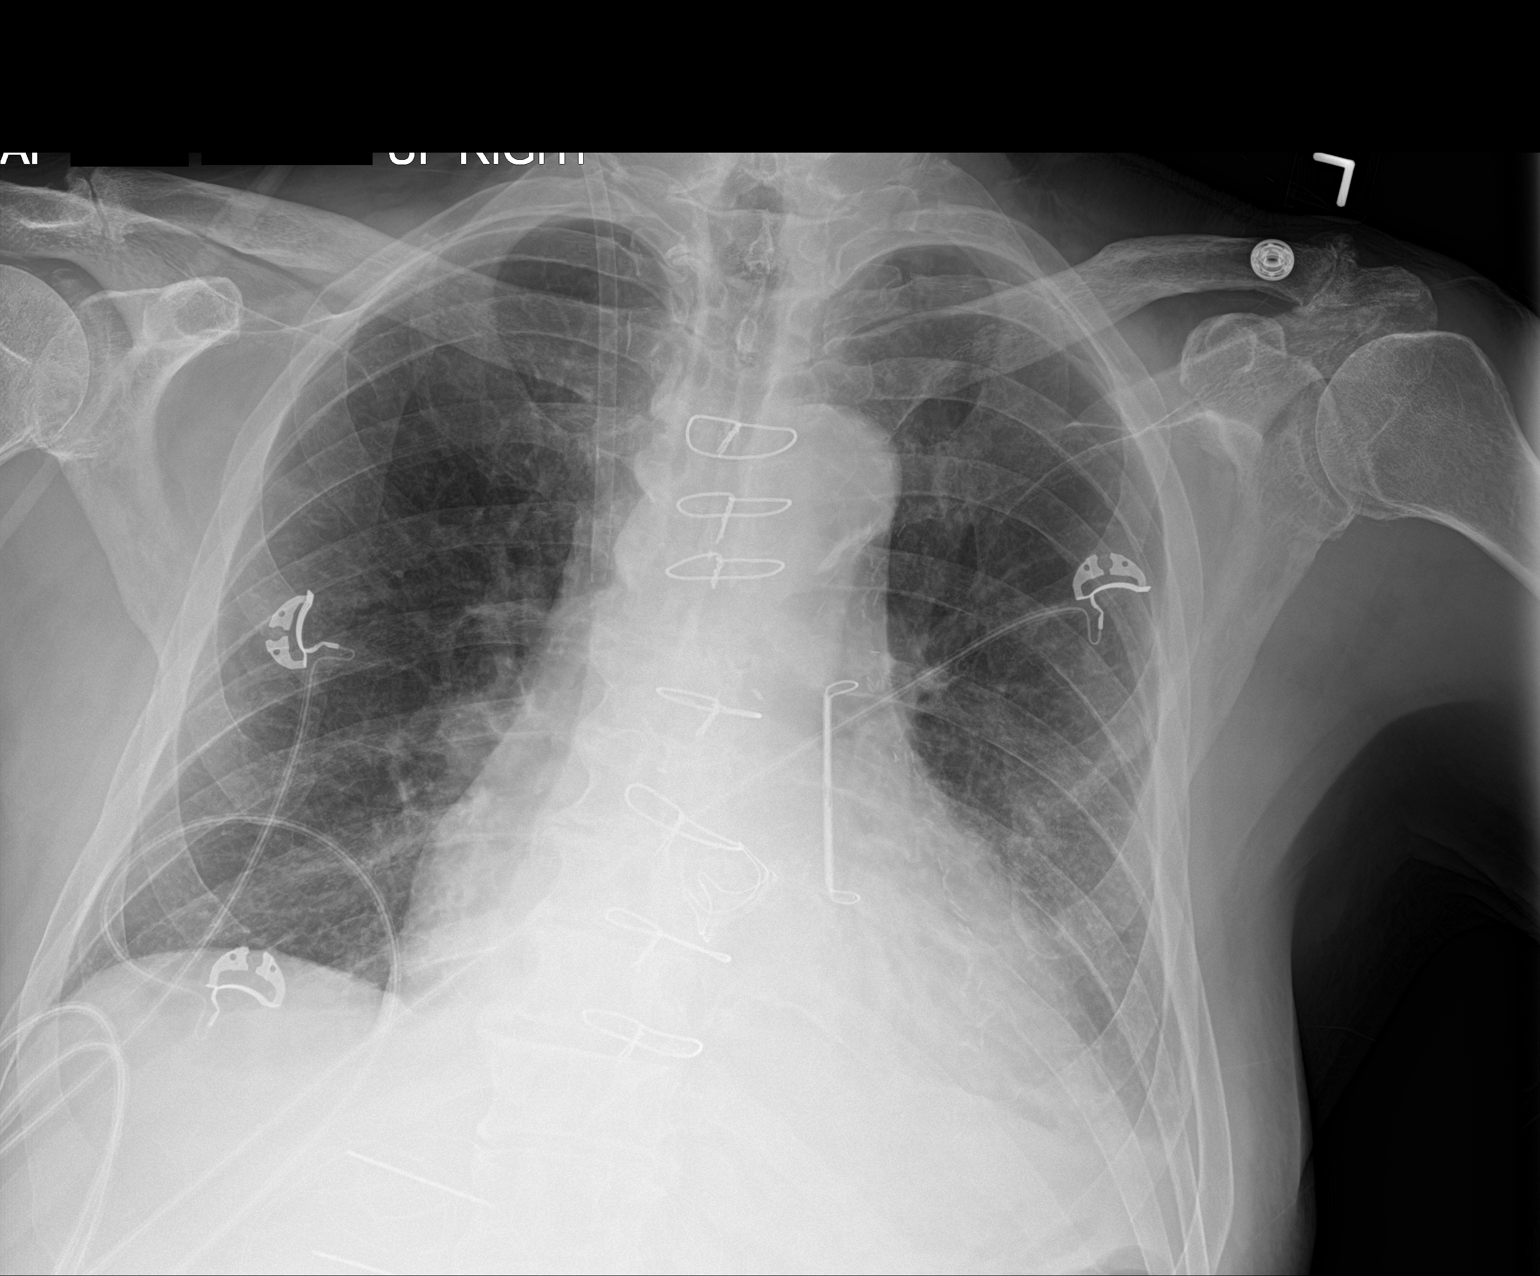

[1 of 1 positions shown; findings below may reference images not displayed]

FINDINGS: Interval removal of Swan-Ganz catheter and left chest tube. No left
pneumothorax. Left base atelectasis with small left effusion. Mild
cardiomegaly.
IMPRESSION: Interval removal of left chest tube.  No pneumothorax.

Cardiomegaly.  Left base atelectasis and small left effusion.

## 2019-07-29 NOTE — Patient Instructions (Signed)
DUE TO COVID-19 ONLY ONE VISITOR IS ALLOWED TO COME WITH YOU AND STAY IN THE WAITING ROOM ONLY DURING PRE OP AND PROCEDURE DAY OF SURGERY. THE 1 VISITOR MAY VISIT WITH YOU AFTER SURGERY IN YOUR PRIVATE ROOM DURING VISITING HOURS ONLY!  YOU NEED TO HAVE A COVID 19 TEST ON: 08/03/19 @             , THIS TEST MUST BE DONE BEFORE SURGERY, COME  [place], [place] , [id number].  ([place]) ONCE YOUR COVID TEST IS COMPLETED, PLEASE BEGIN THE QUARANTINE INSTRUCTIONS AS OUTLINED IN YOUR HANDOUT.                Rhian Funari    Your procedure is scheduled on: 08/07/19   Report to [facility] Main  Entrance   Report to admitting at: 8:10 AM     Call this number if you have problems the morning of surgery [phone]     Remember:  NO SOLID FOOD AFTER MIDNIGHT THE NIGHT PRIOR TO SURGERY. NOTHING BY MOUTH EXCEPT CLEAR LIQUIDS UNTIL: 7:40 am . PLEASE FINISH ENSURE DRINK PER SURGEON ORDER  WHICH NEEDS TO BE COMPLETED AT: 7:40 am .   CLEAR LIQUID DIET   Foods Allowed                                                                     Foods Excluded  Coffee and tea, regular and decaf                             liquids that you cannot  Plain Jell-O any favor except red or purple                                           see through such as: Fruit ices (not with fruit pulp)                                     milk, soups, orange juice  Iced Popsicles                                    All solid food Carbonated beverages, regular and diet                                    Cranberry, grape and apple juices Sports drinks like Gatorade Lightly seasoned clear broth or consume(fat free) Sugar, honey syrup  Sample Menu Breakfast                                Lunch  Supper Cranberry juice                    Beef broth                            Chicken broth Jell-O                                     Grape juice                            Apple juice Coffee or tea                        Jell-O                                      Popsicle                                                Coffee or tea                        Coffee or tea  _____________________________________________________________________   BRUSH YOUR TEETH MORNING OF SURGERY AND RINSE YOUR MOUTH OUT, NO CHEWING GUM CANDY OR MINTS.     Take these medicines the morning of surgery with A SIP OF WATER: allopurinol,amlodipine,loratadine.                                 You may not have any metal on your body including hair pins and              piercings  Do not wear jewelry, lotions, powders or perfumes, deodorant             Men may shave face and neck.   Do not bring valuables to the hospital. [place].  Contacts, dentures or bridgework may not be worn into surgery.  Leave suitcase in the car. After surgery it may be brought to your room.     Patients discharged the day of surgery will not be allowed to drive home. IF YOU ARE HAVING SURGERY AND GOING HOME THE SAME DAY, YOU MUST HAVE AN ADULT TO DRIVE YOU HOME AND BE WITH YOU FOR 24 HOURS. YOU MAY GO HOME BY TAXI OR UBER OR ORTHERWISE, BUT AN ADULT MUST ACCOMPANY YOU HOME AND STAY WITH YOU FOR 24 HOURS.  Name and phone number of your driver:  Special Instructions: N/A              Please read over the following fact sheets you were given: _____________________________________________________________________  [facility] - Preparing for Surgery Before surgery, you can play an important role.  Because skin is not sterile, your skin needs to be as free of germs as possible.  You can reduce the number of germs on your skin by washing with CHG (chlorahexidine gluconate) soap before surgery.  CHG is an antiseptic cleaner which kills germs and bonds with the skin to continue killing germs even after washing. Please DO NOT use if you have an allergy to CHG  or antibacterial soaps.  If your skin becomes reddened/irritated stop using the CHG and inform your nurse when you arrive at Short Stay. Do not shave (including legs and underarms) for at least 48 hours prior to the first CHG shower.  You may shave your face/neck. Please follow these instructions carefully:  1.  Shower with CHG Soap the night before surgery and the  morning of Surgery.  2.  If you choose to wash your hair, wash your hair first as usual with your  normal  shampoo.  3.  After you shampoo, rinse your hair and body thoroughly to remove the  shampoo.                           4.  Use CHG as you would any other liquid soap.  You can apply chg directly  to the skin and wash                       Gently with a scrungie or clean washcloth.  5.  Apply the CHG Soap to your body ONLY FROM THE NECK DOWN.   Do not use on face/ open                           Wound or open sores. Avoid contact with eyes, ears mouth and genitals (private parts).                       Wash face,  Genitals (private parts) with your normal soap.             6.  Wash thoroughly, paying special attention to the area where your surgery  will be performed.  7.  Thoroughly rinse your body with warm water from the neck down.  8.  DO NOT shower/wash with your normal soap after using and rinsing off  the CHG Soap.                9.  Pat yourself dry with a clean towel.            10.  Wear clean pajamas.            11.  Place clean sheets on your bed the night of your first shower and do not  sleep with pets. Day of Surgery : Do not apply any lotions/deodorants the morning of surgery.  Please wear clean clothes to the hospital/surgery center.  FAILURE TO FOLLOW THESE INSTRUCTIONS MAY RESULT IN THE CANCELLATION OF YOUR SURGERY PATIENT SIGNATURE_________________________________  NURSE SIGNATURE__________________________________  ________________________________________________________________________   Adam Phenix  An incentive spirometer is a tool that can help keep your lungs clear and active. This tool measures how well you are filling your lungs with each breath. Taking long deep breaths may help reverse or decrease the chance of developing breathing (pulmonary) problems (especially infection) following:  A long period of time when you are unable to move or be active. BEFORE THE PROCEDURE   If the spirometer includes an indicator to show your best effort, your nurse or respiratory therapist will set it to a desired goal.  If possible, sit up straight or lean slightly forward. Try not to slouch.  Hold the incentive spirometer in an upright position. INSTRUCTIONS FOR USE  1. Sit on the edge of your bed if possible, or sit up as far as you can in bed or on a chair. 2. Hold the incentive spirometer in an upright position. 3. Breathe out normally. 4. Place the mouthpiece in your mouth and seal your lips tightly around it. 5. Breathe in slowly and as deeply as possible, raising the piston or the ball toward the top of the column. 6. Hold your breath for 3-5 seconds or for as long as possible. Allow the piston or ball to fall to the bottom of the column. 7. Remove the mouthpiece from your mouth and breathe out normally. 8. Rest for a few seconds and repeat Steps 1 through 7 at least 10 times every 1-2 hours when you are awake. Take your time and take a few normal breaths between deep breaths. 9. The spirometer may include an indicator to show your best effort. Use the indicator as a goal to work toward during each repetition. 10. After each set of 10 deep breaths, practice coughing to be sure your lungs are clear. If you have an incision (the cut made at the time of surgery), support your incision when coughing by placing a pillow or rolled up towels firmly against it. Once you are able to get out of bed, walk around indoors and cough well. You may stop using the incentive spirometer when  instructed by your caregiver.  RISKS AND COMPLICATIONS  Take your time so you do not get dizzy or light-headed.  If you are in pain, you may need to take or ask for pain medication before doing incentive spirometry. It is harder to take a deep breath if you are having pain. AFTER USE  Rest and breathe slowly and easily.  It can be helpful to keep track of a log of your progress. Your caregiver can provide you with a simple table to help with this. If you are using the spirometer at home, follow these instructions: [place] IF:   You are having difficultly using the spirometer.  You have trouble using the spirometer as often as instructed.  Your pain medication is not giving enough relief while using the spirometer.  You develop fever of 100.5 F (38.1 C) or higher. SEEK IMMEDIATE MEDICAL CARE IF:   You cough up bloody sputum that had not been present before.  You develop fever of 102 F (38.9 C) or greater.  You develop worsening pain at or near the incision site. MAKE SURE YOU:   Understand these instructions.  Will watch your condition.  Will get help right away if you are not doing well or get worse. Document Released: 06/06/2006 Document Revised: 04/18/2011 Document Reviewed: 08/07/2006 [facility] Patient Information 2014 [place], [place].   ________________________________________________________________________

## 2019-07-30 ENCOUNTER — Encounter (HOSPITAL_COMMUNITY)
Admission: RE | Admit: 2019-07-30 | Discharge: 2019-07-30 | Disposition: A | Discharge status: Home / Self Care | Payer: Medicare PPO | Source: Ambulatory Visit | Attending: Orthopedic Surgery | Admitting: Orthopedic Surgery

## 2019-07-30 ENCOUNTER — Encounter (HOSPITAL_COMMUNITY): Payer: Self-pay

## 2019-07-30 ENCOUNTER — Other Ambulatory Visit: Payer: Self-pay

## 2019-07-30 ENCOUNTER — Telehealth: Payer: Self-pay

## 2019-07-30 DIAGNOSIS — Z952 Presence of prosthetic heart valve: Secondary | ICD-10-CM | POA: Insufficient documentation

## 2019-07-30 DIAGNOSIS — I509 Heart failure, unspecified: Secondary | ICD-10-CM | POA: Insufficient documentation

## 2019-07-30 DIAGNOSIS — R9431 Abnormal electrocardiogram [ECG] [EKG]: Secondary | ICD-10-CM | POA: Diagnosis not present

## 2019-07-30 DIAGNOSIS — I498 Other specified cardiac arrhythmias: Secondary | ICD-10-CM | POA: Insufficient documentation

## 2019-07-30 DIAGNOSIS — Z01818 Encounter for other preprocedural examination: Secondary | ICD-10-CM | POA: Insufficient documentation

## 2019-07-30 DIAGNOSIS — Z79899 Other long term (current) drug therapy: Secondary | ICD-10-CM | POA: Diagnosis not present

## 2019-07-30 DIAGNOSIS — M1711 Unilateral primary osteoarthritis, right knee: Secondary | ICD-10-CM | POA: Diagnosis not present

## 2019-07-30 DIAGNOSIS — I110 Hypertensive heart disease with heart failure: Secondary | ICD-10-CM | POA: Diagnosis not present

## 2019-07-30 DIAGNOSIS — Z951 Presence of aortocoronary bypass graft: Secondary | ICD-10-CM | POA: Diagnosis not present

## 2019-07-30 DIAGNOSIS — Z87891 Personal history of nicotine dependence: Secondary | ICD-10-CM | POA: Insufficient documentation

## 2019-07-30 DIAGNOSIS — I4510 Unspecified right bundle-branch block: Secondary | ICD-10-CM | POA: Diagnosis not present

## 2019-07-30 DIAGNOSIS — Z7982 Long term (current) use of aspirin: Secondary | ICD-10-CM | POA: Diagnosis not present

## 2019-07-30 LAB — COMPREHENSIVE METABOLIC PANEL
ALT: 14 U/L (ref 0–44)
AST: 20 U/L (ref 15–41)
Albumin: 4.2 g/dL (ref 3.5–5.0)
Alkaline Phosphatase: 61 U/L (ref 38–126)
Anion gap: 6 (ref 5–15)
BUN: 11 mg/dL (ref 8–23)
CO2: 27 mmol/L (ref 22–32)
Calcium: 9.5 mg/dL (ref 8.9–10.3)
Chloride: 104 mmol/L (ref 98–111)
Creatinine, Ser: 0.9 mg/dL (ref 0.61–1.24)
GFR calc Af Amer: >60 mL/min (ref >60)
GFR calc non Af Amer: >60 mL/min (ref >60)
Glucose, Bld: 96 mg/dL (ref 70–99)
Potassium: 5 mmol/L (ref 3.5–5.1)
Sodium: 137 mmol/L (ref 135–145)
Total Bilirubin: 0.8 mg/dL (ref 0.3–1.2)
Total Protein: 7.2 g/dL (ref 6.5–8.1)

## 2019-07-30 LAB — CBC
HCT: 39.2 % (ref 39.0–52.0)
Hemoglobin: 12.8 g/dL — ABNORMAL LOW (ref 13.0–17.0)
MCH: 31.9 pg (ref 26.0–34.0)
MCHC: 32.7 g/dL (ref 30.0–36.0)
MCV: 97.8 fL (ref 80.0–100.0)
Platelets: 185 10*3/uL (ref 150–400)
RBC: 4.01 MIL/uL — ABNORMAL LOW (ref 4.22–5.81)
RDW: 13 % (ref 11.5–15.5)
WBC: 4.2 10*3/uL (ref 4.0–10.5)
nRBC: 0 % (ref 0.0–0.2)

## 2019-07-30 LAB — PROTIME-INR
INR: 0.9 (ref 0.8–1.2)
Prothrombin Time: 12.2 seconds (ref 11.4–15.2)

## 2019-07-30 LAB — APTT: aPTT: 26 seconds (ref 24–36)

## 2019-07-30 LAB — SURGICAL PCR SCREEN
MRSA, PCR: POSITIVE — AB
Staphylococcus aureus: POSITIVE — AB

## 2019-07-30 LAB — ABO/RH: ABO/RH(D): O POS

## 2019-07-30 NOTE — Progress Notes (Addendum)
COVID Vaccine Completed:yes Date COVID Vaccine completed:04/08/19 COVID vaccine manufacturer: Pfizer    *[employer]   PCP - Dr. Yaakov Guthrie Cardiologist - Dr. Vicenta Dunning. LOV: 08/01/18.  Chest x-ray -  EKG -  Stress Test -  ECHO - 07/24/17 Cardiac Cath -   Sleep Study -  CPAP -   Fasting Blood Sugar -  Checks Blood Sugar _____ times a day  Blood Thinner Instructions: Aspirin Instructions:To stop five days before surgery. Last Dose:  Anesthesia review: Hx.: HTN,CABAGE,CAD,CHF. Ankle wound is dry and intact.scar on.  Patient denies shortness of breath, fever, cough and chest pain at PAT appointment   Patient verbalized understanding of instructions that were given to them at the PAT appointment. Patient was also instructed that they will need to review over the PAT instructions again at home before surgery.

## 2019-07-30 NOTE — Telephone Encounter (Signed)
I though we had faxed it already

## 2019-07-30 NOTE — Telephone Encounter (Signed)
Was this done?   Thank you  -Leda Quail

## 2019-07-30 NOTE — Telephone Encounter (Signed)
Please check with Ma's. I have completed this.

## 2019-07-30 NOTE — Progress Notes (Signed)
PCR: POSITIVE MRSA,STAPH

## 2019-07-30 NOTE — H&P (Signed)
TOTAL KNEE ADMISSION H&P  Patient is being admitted for right total knee arthroplasty.  Subjective:  Chief Complaint: Right knee pain.  HPI: [name], 79 y.o. male has a history of pain and functional disability in the right knee due to arthritis and has failed non-surgical conservative treatments for greater than 12 weeks to include corticosteriod injections and activity modification. Onset of symptoms was gradual, starting several years ago with gradually worsening course since that time. The patient noted prior procedures on the knee to include  arthroscopy and menisectomy on the right knee.  Patient currently rates pain in the right knee at 8 out of 10 with activity. Patient has worsening of pain with activity and weight bearing, pain that interferes with activities of daily living, crepitus and joint swelling. 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 There is no active infection.  Patient Active Problem List   Diagnosis Date Noted  . Coronary artery disease involving coronary bypass graft of native heart without angina pectoris 09/19/2018  . Fatigue 08/01/2018  . Mixed hyperlipidemia 06/06/2018  . S/P left atrial appendage ligation 07/25/2017  . S/P AVR 07/25/2017  . 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  . Coronary artery disease without angina pectoris 07/12/2017  . Mitral regurgitation 07/07/2017  . Acute on chronic systolic heart failure ([place]) 07/07/2017  . Gout 02/12/2013  . Essential hypertension 02/12/2013  . GERD (gastroesophageal reflux disease) 02/12/2013    Past Medical History:  Diagnosis Date  . Anemia   . 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 CAD  . CHF (congestive heart failure) ([place])   . Gout   . Hepatitis 1964   hepatitis A   . HFrEF (heart failure with reduced ejection fraction) ([place])   . Hypertension   . Moderate  mitral regurgitation   . Severe aortic stenosis     Past Surgical History:  Procedure Laterality Date  . AORTIC VALVE REPLACEMENT N/A 07/24/2017   Procedure: AORTIC VALVE REPLACEMENT (AVR) using 39mm Inspiris Aortic Valve;  Surgeon: Gaye Pollack, MD;  Location: [place] [facility];  Service: Open Heart Surgery;  Laterality: N/A;  . CLIPPING OF ATRIAL APPENDAGE N/A 07/24/2017   Procedure: CLIPPING OF ATRIAL APPENDAGE using a 18mm Atricure clip;  Surgeon: Gaye Pollack, MD;  Location: [place];  Service: Open Heart Surgery;  Laterality: N/A;  . CORONARY ARTERY BYPASS GRAFT N/A 07/24/2017   Procedure: CORONARY ARTERY BYPASS GRAFTING (CABG) times 3 using the left greater saphenous vein harvested endoscopically and left internal mammary artery.;  Surgeon: Gaye Pollack, MD;  [facility];  Service: Open Heart Surgery;  Laterality: N/A;  . EYE SURGERY Bilateral    lens replacements for cataracts  . HERNIA REPAIR    . INGUINAL HERNIA REPAIR Right 05/06/2015   Procedure: LAPAROSCOPIC RIGHT INGUINAL HERNIA WITH MESH;  Surgeon: Coralie Keens, MD;  Location: [facility];  Service: General;  Laterality: Right;  . INSERTION OF MESH Right 05/06/2015   Procedure: INSERTION OF MESH;  Surgeon: Coralie Keens, MD;  [facility];  Service: General;  Laterality: Right;  . RIGHT/LEFT HEART CATH AND CORONARY ANGIOGRAPHY N/A 07/10/2017   Procedure: RIGHT/LEFT HEART CATH AND CORONARY ANGIOGRAPHY;  Surgeon: Nigel Mormon, MD;  Location: [place] [facility];  Service: Cardiovascular;  Laterality: N/A;  . TEE WITHOUT CARDIOVERSION N/A 07/11/2017   Procedure: TRANSESOPHAGEAL ECHOCARDIOGRAM (TEE);  Surgeon:  Patwardhan, Reynold Bowen, MD;  [facility];  Service: Cardiovascular;  Laterality: N/A;  . TEE WITHOUT CARDIOVERSION N/A 07/24/2017   Procedure: TRANSESOPHAGEAL ECHOCARDIOGRAM (TEE);  Surgeon: Gaye Pollack, MD;  Location: [place];  Service: Open Heart Surgery;  Laterality: N/A;  . ULTRASOUND GUIDANCE FOR VASCULAR  ACCESS  07/10/2017   Procedure: Ultrasound Guidance For Vascular Access;  Surgeon: Nigel Mormon, MD;  Location: [place] [facility];  Service: Cardiovascular;;    Prior to Admission medications   Medication Sig Start Date End Date Taking? Authorizing Provider  acetaminophen (TYLENOL) 500 MG tablet Take 2 tablets (1,000 mg total) by mouth every 6 (six) hours as needed for mild pain or fever. 07/29/17  Yes Barrett, Erin R, PA-C  allopurinol (ZYLOPRIM) 100 MG tablet Take 200 mg by mouth every morning.   Yes [provider]  amLODipine (NORVASC) 5 MG tablet TAKE 1 TABLET BY MOUTH EVERY DAY Patient taking differently: Take 7.5 mg by mouth daily.  10/23/18  Yes Patwardhan, Reynold Bowen, MD  aspirin EC 81 MG tablet Take 1 tablet (81 mg total) by mouth daily. Patient taking differently: Take 81 mg by mouth every evening.  09/19/18  Yes Patwardhan, Manish J, MD  atorvastatin (LIPITOR) 20 MG tablet Take 1 tablet (20 mg total) by mouth daily at 6 PM. 11/09/18  Yes Patwardhan, Manish J, MD  loratadine (CLARITIN) 10 MG tablet Take 10 mg by mouth daily.   Yes [provider]  psyllium (METAMUCIL) 58.6 % powder Take 1 packet by mouth daily as needed (regularity (3-4 times a week)).    Yes [provider]  tamsulosin (FLOMAX) 0.4 MG CAPS capsule Take 0.4 mg by mouth every evening.    Yes [provider]  losartan (COZAAR) 25 MG tablet TAKE 1 TABLET BY MOUTH EVERY DAY 11/12/18   Patwardhan, Reynold Bowen, MD    No Known Allergies  Social History   Socioeconomic History  . Marital status: Married    Spouse name: Not on file  . Number of children: 1  . Years of education: Not on file  . Highest education level: Not on file  Occupational History  . Not on file  Tobacco Use  . Smoking status: Former Smoker    Packs/day: 1.00    Types: Cigarettes  . Smokeless tobacco: Never Used  . Tobacco comment: quit 30-40 yrs ago  Vaping Use  . Vaping Use: Never used  Substance and  Sexual Activity  . Alcohol use: Yes    Comment: 1-2 drinks a night   . Drug use: No  . Sexual activity: Not on file  Other Topics Concern  . Not on file  Social History Narrative  . Not on file   Social Determinants of Health   Financial Resource Strain:   . Difficulty of Paying Living Expenses:   Food Insecurity:   . Worried About Charity fundraiser in the Last Year:   . Arboriculturist in the Last Year:   Transportation Needs:   . Film/video editor (Medical):   Marland Kitchen Lack of Transportation (Non-Medical):   Physical Activity:   . Days of Exercise per Week:   . Minutes of Exercise per Session:   Stress:   . Feeling of Stress :   Social Connections:   . Frequency of Communication with Friends and Family:   . Frequency of Social Gatherings with Friends and Family:   . Attends Religious Services:   . Active Member of  Clubs or Organizations:   . Attends Archivist Meetings:   Marland Kitchen Marital Status:   Intimate Partner Violence:   . Fear of Current or Ex-Partner:   . Emotionally Abused:   Marland Kitchen Physically Abused:   . Sexually Abused:       Tobacco Use: Medium Risk  . Smoking Tobacco Use: Former Smoker  . Smokeless Tobacco Use: Never Used   Social History   Substance and Sexual Activity  Alcohol Use Yes   Comment: 1-2 drinks a night     Family History  Problem Relation Age of Onset  . Stroke Mother   . Heart attack Father     Review of Systems  Constitutional: Negative for chills and fever.  HENT: Negative for congestion, sore throat and tinnitus.   Eyes: Negative for double vision, photophobia and pain.  Respiratory: Negative for cough, shortness of breath and wheezing.   Cardiovascular: Negative for chest pain, palpitations and orthopnea.  Gastrointestinal: Negative for heartburn, nausea and vomiting.  Genitourinary: Negative for dysuria, frequency and urgency.  Musculoskeletal: Positive for joint pain.  Neurological: Negative for dizziness, weakness  and headaches.    Objective:  Physical Exam: Well nourished and well developed.  General: Alert and oriented x3, cooperative and pleasant, no acute distress.  Head: normocephalic, atraumatic, neck supple.  Eyes: EOMI.  Respiratory: breath sounds clear in all fields, no wheezing, rales, or rhonchi. Cardiovascular: Regular rate and rhythm, no murmurs, gallops or rubs.  Abdomen: non-tender to palpation and soft, normoactive bowel sounds. Musculoskeletal:  Right Knee Exam:  Moderate effusion.  Slight varus deformity.  Range of motion is 5 to 130 degrees.  Moderate crepitus on range of motion of the knee.  Positive medial joint line tenderness.  No lateral joint line tenderness.  Stable knee.  Calves soft and nontender. Motor function intact in LE. Strength 5/5 LE bilaterally. Neuro: Distal pulses 2+. Sensation to light touch intact in LE.  Imaging Review Plain radiographs demonstrate severe degenerative joint disease of the right knee. The overall alignment is mild varus. The bone quality appears to be adequate for age and reported activity level.  Assessment/Plan:  End stage arthritis, right knee   The patient history, physical examination, clinical judgment of the provider and imaging studies are consistent with end stage degenerative joint disease of the right knee and total knee arthroplasty is deemed medically necessary. The treatment options including medical management, injection therapy arthroscopy and arthroplasty were discussed at length. The risks and benefits of total knee arthroplasty were presented and reviewed. The risks due to aseptic loosening, infection, stiffness, patella tracking problems, thromboembolic complications and other imponderables were discussed. The patient acknowledged the explanation, agreed to proceed with the plan and consent was signed. Patient is being admitted for inpatient treatment for surgery, pain control, PT, OT, prophylactic antibiotics, VTE  prophylaxis, progressive ambulation and ADLs and discharge planning. The patient is planning to be discharged home.   Patient's anticipated LOS is less than 2 midnights, meeting these requirements: - Lives within 1 hour of care - Has a competent adult at home to recover with post-op recover - NO history of  - Chronic pain requiring opiods  - Diabetes  - Heart attack  - Stroke  - DVT/VTE  - Cardiac arrhythmia  - Respiratory Failure/COPD  - Renal failure  - Anemia  - Advanced Liver disease  Therapy Plans: Outpatient therapy at [facility] Disposition: Home with wife Planned DVT Prophylaxis: Xarelto 10 mg QD  DME Needed: None PCP:  Yaakov Guthrie, MD  Cardiologist: Vernell Leep, MD  TXA: IV Allergies: NKDA Anesthesia Concerns: None BMI: 25.5  Other: - No clearance in chart from Dr. Jacelyn Grip or Dr. Virgina Jock. Patient is going to contact both offices today to ensure he does not need further workup.  - Hx CAD with CABG (takes 81 mg ASA QD)  - Patient was instructed on what medications to stop prior to surgery. - Follow-up visit in 2 weeks with Dr. Wynelle Link - Begin physical therapy following surgery - Pre-operative lab work as pre-surgical testing - Prescriptions will be provided in hospital at time of discharge  Theresa Duty, PA-C Orthopedic Surgery [facility] [place]

## 2019-07-31 NOTE — Anesthesia Preprocedure Evaluation (Addendum)
Anesthesia Evaluation  Patient identified by MRN, date of birth, ID band Patient awake    Reviewed: Allergy & Precautions, NPO status , Patient's Chart, lab work & pertinent test results  Airway Mallampati: II  TM Distance: >3 FB Neck ROM: Full    Dental no notable dental hx.    Pulmonary neg pulmonary ROS, former smoker,    Pulmonary exam normal breath sounds clear to auscultation       Cardiovascular hypertension, + CAD, + CABG and +CHF  + Valvular Problems/Murmurs MR  Rhythm:Regular Rate:Normal + Systolic murmurs S/P AVR   Neuro/Psych negative neurological ROS  negative psych ROS   GI/Hepatic negative GI ROS, Neg liver ROS,   Endo/Other  negative endocrine ROS  Renal/GU negative Renal ROS  negative genitourinary   Musculoskeletal negative musculoskeletal ROS (+)   Abdominal   Peds negative pediatric ROS (+)  Hematology negative hematology ROS (+)   Anesthesia Other Findings   Reproductive/Obstetrics negative OB ROS                           Anesthesia Physical Anesthesia Plan  ASA: III  Anesthesia Plan: Spinal   Post-op Pain Management:  Regional for Post-op pain   Induction: Intravenous  PONV Risk Score and Plan: 2 and Ondansetron and Dexamethasone  Airway Management Planned: Simple Face Mask  Additional Equipment:   Intra-op Plan:   Post-operative Plan:   Informed Consent: I have reviewed the patients History and Physical, chart, labs and discussed the procedure including the risks, benefits and alternatives for the proposed anesthesia with the patient or authorized representative who has indicated his/her understanding and acceptance.     Dental advisory given  Plan Discussed with: CRNA and Surgeon  Anesthesia Plan Comments: (See PAT note 07/30/2019, Konrad Felix, PA-C)       Anesthesia Quick Evaluation

## 2019-07-31 NOTE — Progress Notes (Signed)
Anesthesia Chart Review   Case: [id number] Date/Time: 08/07/19 1025   Procedure: TOTAL KNEE ARTHROPLASTY (Right Knee)   Anesthesia type: Choice   Pre-op diagnosis: right knee osteoarthritis   Location: [facility]   Surgeons: Gaynelle Arabian, MD      DISCUSSION:79 y.o. former smoker with h/o HTN, CHF, s/p CABG x 3, AVR, and LAA clipping 08/2017, right knee OA scheduled for above procedure 08/07/2019 with Dr. Gaynelle Arabian.   Pt last seen by cardiology 09/19/2018.  Per OV note HTN well controlled, no angina symptoms, bioprosthetic AVR stable with EF 45-50%, no sx of heart failure. 1 year follow up recommended.  Dr. Virgina Jock advised pt to not stop asa.    Anticipate pt can proceed with planned procedure barring acute status change.   VS: BP 118/67   Pulse 64   Temp 36.5 C (Oral)   Resp 18   Ht 5\' 11"  (1.803 m)   Wt 84.1 kg   SpO2 98%   BMI 25.86 kg/m   PROVIDERS: Vernie Shanks, MD is PCP    LABS: Labs reviewed: Acceptable for surgery. (all labs ordered are listed, but only abnormal results are displayed)  Labs Reviewed  SURGICAL PCR SCREEN - Abnormal; Notable for the following components:      Result Value   MRSA, PCR POSITIVE (*)    Staphylococcus aureus POSITIVE (*)    All other components within normal limits  CBC - Abnormal; Notable for the following components:   RBC 4.01 (*)    Hemoglobin 12.8 (*)    All other components within normal limits  APTT  COMPREHENSIVE METABOLIC PANEL  PROTIME-INR  TYPE AND SCREEN     IMAGES:   EKG: 07/30/2019 Rate 66 bpm Normal sinus rhythm with sinus arrhythmia Right bundle branch block Abnormal ECG No significant change since last tracing  CV: Echocardiogram 02/20/2018: Left ventricle cavity is normal in size. Moderate concentric hypertrophy of the left ventricle. Normal diastolic filling pattern. Abnormal septal wall motion due to post-operative coronary artery bypass graft. Visual EF is 45-50%. Left atrial  cavity is moderate to severely dilated at 5.0 cm.  Bioprosthetic aortic valve with no regurgitation noted. No evidence of aortic valve stenosis. Peak PG of 12 and mean PG of 8 mm Hg. Mild to moderate mitral regurgitation. Mild tricuspid regurgitation. No evidence of pulmonary hypertension. Compared to the study done on 08/09/2017, EF appears to have slightly improved from prior 35-40%. Moderate pulmonary hypertension no longer present. Past Medical History:  Diagnosis Date  . Anemia   . 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 CAD  . CHF (congestive heart failure) ([place])   . Gout   . Hepatitis 1964   hepatitis A   . HFrEF (heart failure with reduced ejection fraction) ([place])   . Hypertension   . Moderate mitral regurgitation   . Severe aortic stenosis     Past Surgical History:  Procedure Laterality Date  . AORTIC VALVE REPLACEMENT N/A 07/24/2017   Procedure: AORTIC VALVE REPLACEMENT (AVR) using 48mm Inspiris Aortic Valve;  Surgeon: Gaye Pollack, MD;  Location: [place] [facility];  Service: Open Heart Surgery;  Laterality: N/A;  . CLIPPING OF ATRIAL APPENDAGE N/A 07/24/2017   Procedure: CLIPPING OF ATRIAL APPENDAGE using a 19mm Atricure clip;  Surgeon: Gaye Pollack, MD;  Location: [place];  Service: Open Heart Surgery;  Laterality: N/A;  . CORONARY ARTERY BYPASS GRAFT N/A 07/24/2017   Procedure: CORONARY ARTERY BYPASS GRAFTING (CABG)  times 3 using the left greater saphenous vein harvested endoscopically and left internal mammary artery.;  Surgeon: Gaye Pollack, MD;  [facility];  Service: Open Heart Surgery;  Laterality: N/A;  . EYE SURGERY Bilateral    lens replacements for cataracts  . HERNIA REPAIR    . INGUINAL HERNIA REPAIR Right 05/06/2015   Procedure: LAPAROSCOPIC RIGHT INGUINAL HERNIA WITH MESH;  Surgeon: Coralie Keens, MD;  [facility];  Service: General;  Laterality: Right;  . INSERTION OF MESH Right 05/06/2015   Procedure: INSERTION OF MESH;  Surgeon: Coralie Keens, MD;  [facility];  Service: General;  Laterality: Right;  . RIGHT/LEFT HEART CATH AND CORONARY ANGIOGRAPHY N/A 07/10/2017   Procedure: RIGHT/LEFT HEART CATH AND CORONARY ANGIOGRAPHY;  Surgeon: Nigel Mormon, MD;  Location: [place] [facility];  Service: Cardiovascular;  Laterality: N/A;  . TEE WITHOUT CARDIOVERSION N/A 07/11/2017   Procedure: TRANSESOPHAGEAL ECHOCARDIOGRAM (TEE);  Surgeon: Nigel Mormon, MD;  Location: [facility];  Service: Cardiovascular;  Laterality: N/A;  . TEE WITHOUT CARDIOVERSION N/A 07/24/2017   Procedure: TRANSESOPHAGEAL ECHOCARDIOGRAM (TEE);  Surgeon: Gaye Pollack, MD;  Location: [place];  Service: Open Heart Surgery;  Laterality: N/A;  . ULTRASOUND GUIDANCE FOR VASCULAR ACCESS  07/10/2017   Procedure: Ultrasound Guidance For Vascular Access;  Surgeon: Nigel Mormon, MD;  Location: [place] [facility];  Service: Cardiovascular;;    MEDICATIONS: . acetaminophen (TYLENOL) 500 MG tablet  . allopurinol (ZYLOPRIM) 100 MG tablet  . amLODipine (NORVASC) 5 MG tablet  . aspirin EC 81 MG tablet  . atorvastatin (LIPITOR) 20 MG tablet  . loratadine (CLARITIN) 10 MG tablet  . losartan (COZAAR) 25 MG tablet  . psyllium (METAMUCIL) 58.6 % powder  . tamsulosin (FLOMAX) 0.4 MG CAPS capsule   No current facility-administered medications for this encounter.    Maia Plan [facility] Pre-Surgical Testing [phone] 07/31/19  3:39 PM

## 2019-07-31 NOTE — Telephone Encounter (Signed)
Do you have it back there?  We can re fax

## 2019-07-31 NOTE — Telephone Encounter (Signed)
I dont know I can look for it

## 2019-08-03 ENCOUNTER — Other Ambulatory Visit (HOSPITAL_COMMUNITY)
Admission: RE | Admit: 2019-08-03 | Discharge: 2019-08-03 | Disposition: A | Discharge status: Home / Self Care | Payer: Medicare PPO | Source: Ambulatory Visit | Attending: Orthopedic Surgery | Admitting: Orthopedic Surgery

## 2019-08-03 DIAGNOSIS — Z01812 Encounter for preprocedural laboratory examination: Secondary | ICD-10-CM | POA: Insufficient documentation

## 2019-08-03 DIAGNOSIS — Z20822 Contact with and (suspected) exposure to covid-19: Secondary | ICD-10-CM | POA: Diagnosis not present

## 2019-08-03 LAB — SARS CORONAVIRUS 2 (TAT 6-24 HRS): SARS Coronavirus 2: NEGATIVE

## 2019-08-06 MED ORDER — BUPIVACAINE LIPOSOME 1.3 % IJ SUSP
20.0000 mL | Freq: Once | INTRAMUSCULAR | Status: DC
  Filled 2019-08-06: qty 20

## 2019-08-06 NOTE — Progress Notes (Signed)
[name] ([id number]) , Visit Report for 04/02/2019 Arrival Information Details Patient Name: Date of Service: Jesse, [name] 04/02/2019 2:00 PM Medical Record Number: [id number] Patient Account Number: [id number] Date of Birth/Sex: Treating [name]: [birth year] (79 y.o. [name] Primary Care River Mckercher: WO [name], [name] Other Clinician: Referring [name]: Treating [name]/Extender: [name], [name] Weeks in Treatment: 6 Visit Information History Since Last Visit Added or deleted any medications: No Patient Arrived: Walker Any new allergies or adverse reactions: No Arrival Time: 14:14 Had a fall or experienced change in No Accompanied By: alone activities of daily living that may affect Transfer Assistance: None risk of falls: Patient Identification Verified: Yes Signs or symptoms of abuse/neglect since last visito No Secondary Verification Process Completed: Yes Hospitalized since last visit: No Patient Requires Transmission-Based Precautions: No Implantable device outside of the clinic excluding No Patient Has Alerts: Yes cellular tissue based products placed in the center Patient Alerts: left ABI N/C since last visit: Has Dressing in Place as Prescribed: Yes Has Compression in Place as Prescribed: Yes Pain Present Now: No Electronic Signature(s) Signed: 04/04/2019 8:56:01 AM By: [name] Entered By: [name] 14:14:20 -------------------------------------------------------------------------------- Compression Therapy Details Patient Name: Date of Service: [name] 04/02/2019 2:00 PM Medical Record Number: [id number] Patient Account Number: [id number] Date of Birth/Sex: Treating [name]: [birth year] (78 y.o. [name]) [name] Primary Care Sapphira Harjo: [name] [name], [name] NCIS Other Clinician: Referring [name]: Treating [name]/Extender: [name], [name] Weeks in Treatment: 6 Compression Therapy Performed for  Wound Assessment: Wound #1 Left Lower Leg Performed By: Clinician Jesse Coria, [name] Compression Type: Three Layer Post Procedure Diagnosis Same as Pre-procedure Electronic Signature(s) Signed: 04/02/2019 6:06:02 PM By: [name] Entered By: [name] on [name] 14:40:52 -------------------------------------------------------------------------------- Encounter Discharge Information Details Patient Name: Date of Service: Jesse, [name] 04/02/2019 2:00 PM Medical Record Number: [id number] Patient Account Number: [id number] Date of Birth/Sex: Treating [name]: 05-29-1940 (79 y.o. [name] Primary Care Ellaree Gear: [name], [name] Other Clinician: Referring Lyndsay Talamante: Treating [name]/Extender: [name], [name] Weeks in Treatment: 6 Encounter Discharge Information Items Post Procedure Vitals Discharge Condition: Stable Temperature (F): 98.0 Ambulatory Status: Walker Pulse (bpm): 71 Discharge Destination: Home Respiratory Rate (breaths/min): 18 Transportation: Private Auto Blood Pressure (mmHg): 132/51 Accompanied By: alone Schedule Follow-up Appointment: Yes Clinical Summary of Care: Patient Declined Electronic Signature(s) Signed: 04/04/2019 8:56:01 AM By: [name] Entered By: [name] 14:55:57 -------------------------------------------------------------------------------- Lower Extremity Assessment Details Patient Name: Date of Service: [name] 04/02/2019 2:00 PM Medical Record Number: [id number] Patient Account Number: [id number] Date of Birth/Sex: Treating [name]: 1940/02/14 (79 y.o. [name] Primary Care [name]: [name], [name] Other Clinician: Referring [name]: Treating [name]/Extender: [name], [name] Weeks in Treatment: 6 Edema Assessment Assessed: [Left: No] [Right: No] Edema: [Left: N] [Right: o] Calf Left: Right: Point of Measurement: 32 cm From Medial Instep 31 cm  cm Ankle Left: Right: Point of Measurement: 11 cm From Medial Instep 22.8 cm cm Vascular Assessment Pulses: Dorsalis Pedis Palpable: [Left:Yes] Electronic Signature(s) Signed: 04/04/2019 8:56:01 AM By: [name] Entered By: [name] 14:18:08 -------------------------------------------------------------------------------- Multi Wound Chart Details Patient Name: Date of Service: Jesse, [name] 04/02/2019 2:00 PM Medical Record Number: [id number] Patient Account Number: [id number] Date of Birth/Sex: Treating [name]: 09-Dec-1940 (78 y.o. [name] Primary Care Haedyn Breau: [name], [name] NCIS Other Clinician: Referring [name]: Treating [name]/Extender: [name],  [name] Weeks in Treatment: 6 Vital Signs Height(in): 71 Pulse(bpm): 71 Weight(lbs): 175 Blood Pressure(mmHg): 132/51 Body Mass Index(BMI): 24 Temperature(F): 98.0 Respiratory Rate(breaths/min): 18 Photos: [1:No Photos Left Lower Leg] [N/A:N/A N/A] Wound Location: [1:Trauma] [N/A:N/A] Wounding Event: [1:Trauma, Other] [N/A:N/A] Primary Etiology: [1:Infection - not elsewhere classified] [N/A:N/A] Secondary Etiology: [1:Cataracts, Congestive Heart Failure,] [N/A:N/A] Comorbid History: [1:Coronary Artery Disease, Hypertension, Hepatitis A, Gout 01/18/2019] [N/A:N/A] Date Acquired: [1:6] [N/A:N/A] Weeks of Treatment: [1:Open] [N/A:N/A] Wound Status: [1:3.8x0.7x0.2] [N/A:N/A] Measurements L x W x D (cm) [1:2.089] [N/A:N/A] A (cm) : rea [1:0.418] [N/A:N/A] Volume (cm) : [1:94.00%] [N/A:N/A] % Reduction in A [1:rea: 98.80%] [N/A:N/A] % Reduction in Volume: [1:Full Thickness Without Exposed] [N/A:N/A] Classification: [1:Support Structures Medium] [N/A:N/A] Exudate A mount: [1:Serosanguineous] [N/A:N/A] Exudate Type: [1:red, brown] [N/A:N/A] Exudate Color: [1:Flat and Intact] [N/A:N/A] Wound Margin: [1:Large (67-100%)] [N/A:N/A] Granulation A mount: [1:Red]  [N/A:N/A] Granulation Quality: [1:None Present (0%)] [N/A:N/A] Necrotic A mount: [1:Fat Layer (Subcutaneous Tissue)] [N/A:N/A] Exposed Structures: [1:Exposed: Yes Fascia: No Tendon: No Muscle: No Joint: No Bone: No Medium (34-66%)] [N/A:N/A] Epithelialization: [1:Debridement - Excisional] [N/A:N/A] Debridement: Pre-procedure Verification/Time Out 14:38 [N/A:N/A] Taken: [1:Lidocaine 5% topical ointment] [N/A:N/A] Pain Control: [1:Subcutaneous] [N/A:N/A] Tissue Debrided: [1:Skin/Subcutaneous Tissue] [N/A:N/A] Level: [1:2.66] [N/A:N/A] Debridement A (sq cm): [1:rea Curette] [N/A:N/A] Instrument: [1:Moderate] [N/A:N/A] Bleeding: [1:Pressure] [N/A:N/A] Hemostasis A chieved: [1:0] [N/A:N/A] Procedural Pain: [1:0] [N/A:N/A] Post Procedural Pain: [1:Procedure was tolerated well] [N/A:N/A] Debridement Treatment Response: [1:3.8x0.7x0.2] [N/A:N/A] Post Debridement Measurements L x W x D (cm) [1:0.418] [N/A:N/A] Post Debridement Volume: (cm) [1:Compression Therapy] [N/A:N/A] Procedures Performed: [1:Debridement] Treatment Notes Electronic Signature(s) Signed: 04/02/2019 6:00:34 PM By: Linton Ham MD Signed: 04/02/2019 6:06:02 PM By: [name] Entered By: Linton Ham on [name] 14:51:39 -------------------------------------------------------------------------------- Multi-Disciplinary Care Plan Details Patient Name: Date of Service: Jesse, [name] 04/02/2019 2:00 PM Medical Record Number: [id number] Patient Account Number: [id number] Date of Birth/Sex: Treating [name]: Mar 11, 1940 (78 y.o. [name] Primary Care Briann Sarchet: [name], [name] Other Clinician: Referring Lillyauna Jenkinson: Treating Delanna Blacketer/Extender: [name], [name] Weeks in Treatment: 6 Active Inactive Wound/Skin Impairment Nursing Diagnoses: Knowledge deficit related to ulceration/compromised skin integrity Goals: Patient/caregiver will verbalize understanding of skin care regimen Date Initiated:  02/19/2019 Date Inactivated: 03/26/2019 Target Resolution Date: 03/22/2019 Goal Status: Met Ulcer/skin breakdown will have a volume reduction of 30% by week 4 Date Initiated: 02/19/2019 Date Inactivated: 03/26/2019 Target Resolution Date: 03/22/2019 Goal Status: Met Ulcer/skin breakdown will have a volume reduction of 50% by week 8 Date Initiated: 03/26/2019 Target Resolution Date: 04/19/2019 Goal Status: Active Interventions: Assess patient/caregiver ability to obtain necessary supplies Assess patient/caregiver ability to perform ulcer/skin care regimen upon admission and as needed Assess ulceration(s) every visit Notes: Electronic Signature(s) Signed: 04/02/2019 6:06:02 PM By: [name] Entered By: [name] on [name] 13:56:52 -------------------------------------------------------------------------------- Pain Assessment Details Patient Name: Date of Service: Jesse, [name] 04/02/2019 2:00 PM Medical Record Number: [id number] Patient Account Number: [id number] Date of Birth/Sex: Treating [name]: 06/28/1940 (79 y.o. [name] Primary Care Rishi Vicario: [name], [name] Other Clinician: Referring Artemio Dobie: Treating Jayleen Scaglione/Extender: [name], [name] Weeks in Treatment: 6 Active Problems Location of Pain Severity and Description of Pain Patient Has Paino No Site Locations Pain Management and Medication Current Pain Management: Electronic Signature(s) Signed: 04/04/2019 8:56:01 AM By: [name] Entered By: [name] 14:14:32 -------------------------------------------------------------------------------- Patient/Caregiver Education Details Patient Name: Date of Service: DARNEL, MCHAN 2/23/2021andnbsp2:00 PM Medical Record Number: [id number] Patient Account Number: [id number] Date of Birth/Gender: Treating [name]: 1940/04/07 (  79 y.o. [name]) Epps, Morey Hummingbird Primary Care Physician: [name], [place] NCIS Other Clinician: Referring  Physician: Treating Physician/Extender: [name], [name] Weeks in Treatment: 6 Education Assessment Education Provided To: Patient Education Topics Provided Wound/Skin Impairment: Methods: Explain/Verbal Responses: State content correctly Electronic Signature(s) Signed: 04/02/2019 6:06:02 PM By: [name] Entered By: [name] on [name] 13:57:05 -------------------------------------------------------------------------------- Wound Assessment Details Patient Name: Date of Service: Jesse, [name] 04/02/2019 2:00 PM Medical Record Number: [id number] Patient Account Number: [id number] Date of Birth/Sex: Treating [name]: 07/10/1940 (78 y.o. [name]) [name] Primary Care Saba Gomm: [name], [name] Other Clinician: Referring Chun Sellen: Treating Alencia Gordon/Extender: [name], [name] Weeks in Treatment: 6 Wound Status Wound Number: 1 Primary Trauma, Other Etiology: Wound Location: Left Lower Leg Secondary Infection - not elsewhere classified Wounding Event: Trauma Etiology: Date Acquired: 01/18/2019 Wound Open Weeks Of Treatment: 6 Status: Clustered Wound: No Comorbid Cataracts, Congestive Heart Failure, Coronary Artery Disease, History: Hypertension, Hepatitis A, Gout Photos Wound Measurements Length: (cm) 3.8 Width: (cm) 0.7 Depth: (cm) 0.2 Area: (cm) 2.089 Volume: (cm) 0.418 % Reduction in Area: 94% % Reduction in Volume: 98.8% Epithelialization: Medium (34-66%) Tunneling: No Undermining: No Wound Description Classification: Full Thickness Without Exposed Support Structures Wound Margin: Flat and Intact Exudate Amount: Medium Exudate Type: Serosanguineous Exudate Color: red, brown Foul Odor After Cleansing: No Slough/Fibrino No Wound Bed Granulation Amount: Large (67-100%) Exposed Structure Granulation Quality: Red Fascia Exposed: No Necrotic Amount: None Present (0%) Fat Layer (Subcutaneous Tissue) Exposed: Yes Tendon  Exposed: No Muscle Exposed: No Joint Exposed: No Bone Exposed: No Electronic Signature(s) Signed: 04/03/2019 5:09:01 PM By: Mikeal Hawthorne EMT/HBOT Signed: 08/06/2019 5:26:37 PM By: [name] Entered By: Mikeal Hawthorne on 04/03/2019 15:03:01 -------------------------------------------------------------------------------- Vitals Details Patient Name: Date of Service: Jesse, [name] 04/02/2019 2:00 PM Medical Record Number: [id number] Patient Account Number: [id number] Date of Birth/Sex: Treating [name]: 1940/03/28 (79 y.o. [name] Primary Care Jotham Ahn: WO [name], [name] Other Clinician: Referring Emeterio Balke: Treating Krissa Utke/Extender: [name], [name] Weeks in Treatment: 6 Vital Signs Time Taken: 14:14 Temperature (F): 98.0 Height (in): 71 Pulse (bpm): 71 Weight (lbs): 175 Respiratory Rate (breaths/min): 18 Body Mass Index (BMI): 24.4 Blood Pressure (mmHg): 132/51 Reference Range: 80 - 120 mg / dl Electronic Signature(s) Signed: 04/04/2019 8:56:01 AM By: [name] Entered By: [name] 14:15:46

## 2019-08-07 ENCOUNTER — Ambulatory Visit (HOSPITAL_COMMUNITY)
Admission: RE | Admit: 2019-08-07 | Discharge: 2019-08-08 | Disposition: A | Discharge status: Home / Self Care | Payer: Medicare PPO | Attending: Orthopedic Surgery | Admitting: Orthopedic Surgery

## 2019-08-07 ENCOUNTER — Encounter (HOSPITAL_COMMUNITY)
Admission: RE | Disposition: A | Discharge status: Home / Self Care | Payer: Self-pay | Source: Home / Self Care | Attending: Orthopedic Surgery

## 2019-08-07 ENCOUNTER — Ambulatory Visit (HOSPITAL_COMMUNITY): Payer: Medicare PPO | Admitting: Physician Assistant

## 2019-08-07 ENCOUNTER — Ambulatory Visit (HOSPITAL_COMMUNITY): Payer: Medicare PPO | Admitting: Certified Registered Nurse Anesthetist

## 2019-08-07 ENCOUNTER — Other Ambulatory Visit: Payer: Self-pay

## 2019-08-07 ENCOUNTER — Encounter (HOSPITAL_COMMUNITY): Payer: Self-pay | Admitting: Orthopedic Surgery

## 2019-08-07 DIAGNOSIS — Z951 Presence of aortocoronary bypass graft: Secondary | ICD-10-CM | POA: Diagnosis not present

## 2019-08-07 DIAGNOSIS — D649 Anemia, unspecified: Secondary | ICD-10-CM | POA: Diagnosis not present

## 2019-08-07 DIAGNOSIS — Z87891 Personal history of nicotine dependence: Secondary | ICD-10-CM | POA: Insufficient documentation

## 2019-08-07 DIAGNOSIS — M109 Gout, unspecified: Secondary | ICD-10-CM | POA: Insufficient documentation

## 2019-08-07 DIAGNOSIS — Z79899 Other long term (current) drug therapy: Secondary | ICD-10-CM | POA: Insufficient documentation

## 2019-08-07 DIAGNOSIS — Z952 Presence of prosthetic heart valve: Secondary | ICD-10-CM | POA: Diagnosis not present

## 2019-08-07 DIAGNOSIS — K219 Gastro-esophageal reflux disease without esophagitis: Secondary | ICD-10-CM | POA: Diagnosis not present

## 2019-08-07 DIAGNOSIS — I5023 Acute on chronic systolic (congestive) heart failure: Secondary | ICD-10-CM | POA: Diagnosis not present

## 2019-08-07 DIAGNOSIS — I110 Hypertensive heart disease with heart failure: Secondary | ICD-10-CM | POA: Diagnosis not present

## 2019-08-07 DIAGNOSIS — Z7982 Long term (current) use of aspirin: Secondary | ICD-10-CM | POA: Diagnosis not present

## 2019-08-07 DIAGNOSIS — M1711 Unilateral primary osteoarthritis, right knee: Secondary | ICD-10-CM | POA: Diagnosis present

## 2019-08-07 DIAGNOSIS — I25810 Atherosclerosis of coronary artery bypass graft(s) without angina pectoris: Secondary | ICD-10-CM | POA: Diagnosis not present

## 2019-08-07 DIAGNOSIS — I340 Nonrheumatic mitral (valve) insufficiency: Secondary | ICD-10-CM | POA: Diagnosis not present

## 2019-08-07 DIAGNOSIS — E782 Mixed hyperlipidemia: Secondary | ICD-10-CM | POA: Diagnosis not present

## 2019-08-07 DIAGNOSIS — M179 Osteoarthritis of knee, unspecified: Secondary | ICD-10-CM | POA: Diagnosis present

## 2019-08-07 DIAGNOSIS — M1710 Unilateral primary osteoarthritis, unspecified knee: Secondary | ICD-10-CM | POA: Diagnosis present

## 2019-08-07 HISTORY — PX: TOTAL KNEE ARTHROPLASTY: SHX125

## 2019-08-07 LAB — TYPE AND SCREEN
ABO/RH(D): O POS
Antibody Screen: NEGATIVE

## 2019-08-07 SURGERY — ARTHROPLASTY, KNEE, TOTAL
Anesthesia: Spinal | Site: Knee | Laterality: Right

## 2019-08-07 MED ORDER — PHENYLEPHRINE 40 MCG/ML (10ML) SYRINGE FOR IV PUSH (FOR BLOOD PRESSURE SUPPORT)
PREFILLED_SYRINGE | INTRAVENOUS | Status: AC
  Filled 2019-08-07: qty 20

## 2019-08-07 MED ORDER — FENTANYL CITRATE (PF) 100 MCG/2ML IJ SOLN
INTRAMUSCULAR | Status: AC
  Filled 2019-08-07: qty 2

## 2019-08-07 MED ORDER — MORPHINE SULFATE (PF) 2 MG/ML IV SOLN: 0.5000 mg | INTRAVENOUS | Status: DC | PRN

## 2019-08-07 MED ORDER — METOCLOPRAMIDE HCL 5 MG PO TABS: 5.0000 mg | ORAL_TABLET | Freq: Three times a day (TID) | ORAL | Status: DC | PRN

## 2019-08-07 MED ORDER — LORATADINE 10 MG PO TABS
10.0000 mg | ORAL_TABLET | Freq: Every day | ORAL | Status: DC
  Administered 2019-08-08: 10 mg via ORAL
  Filled 2019-08-07: qty 1

## 2019-08-07 MED ORDER — FENTANYL CITRATE (PF) 100 MCG/2ML IJ SOLN
INTRAMUSCULAR | Status: DC | PRN
  Administered 2019-08-07: 100 ug via INTRAVENOUS

## 2019-08-07 MED ORDER — MENTHOL 3 MG MT LOZG: 1.0000 | LOZENGE | OROMUCOSAL | Status: DC | PRN

## 2019-08-07 MED ORDER — FENTANYL CITRATE (PF) 100 MCG/2ML IJ SOLN
25.0000 ug | Freq: Once | INTRAMUSCULAR | Status: AC
  Administered 2019-08-07: 50 ug via INTRAVENOUS
  Filled 2019-08-07: qty 2

## 2019-08-07 MED ORDER — TAMSULOSIN HCL 0.4 MG PO CAPS: 0.4000 mg | ORAL_CAPSULE | Freq: Every evening | ORAL | Status: DC

## 2019-08-07 MED ORDER — VANCOMYCIN HCL IN DEXTROSE 1-5 GM/200ML-% IV SOLN
1000.0000 mg | Freq: Once | INTRAVENOUS | Status: AC
  Administered 2019-08-07: 1000 mg via INTRAVENOUS
  Filled 2019-08-07: qty 200

## 2019-08-07 MED ORDER — SODIUM CHLORIDE 0.9 % IR SOLN
Status: DC | PRN
  Administered 2019-08-07: 2000 mL

## 2019-08-07 MED ORDER — SODIUM CHLORIDE 0.9 % IR SOLN
Status: DC | PRN
  Administered 2019-08-07: 1000 mL

## 2019-08-07 MED ORDER — METHOCARBAMOL 500 MG PO TABS
500.0000 mg | ORAL_TABLET | Freq: Four times a day (QID) | ORAL | Status: DC | PRN
  Administered 2019-08-07: 500 mg via ORAL
  Filled 2019-08-07: qty 1

## 2019-08-07 MED ORDER — ALLOPURINOL 100 MG PO TABS
200.0000 mg | ORAL_TABLET | Freq: Every morning | ORAL | Status: DC
  Filled 2019-08-07: qty 2

## 2019-08-07 MED ORDER — CEFAZOLIN SODIUM-DEXTROSE 2-4 GM/100ML-% IV SOLN
2.0000 g | INTRAVENOUS | Status: AC
  Administered 2019-08-07: 2 g via INTRAVENOUS
  Filled 2019-08-07: qty 100

## 2019-08-07 MED ORDER — BUPIVACAINE LIPOSOME 1.3 % IJ SUSP
INTRAMUSCULAR | Status: DC | PRN
  Administered 2019-08-07: 20 mL

## 2019-08-07 MED ORDER — TRAMADOL HCL 50 MG PO TABS
50.0000 mg | ORAL_TABLET | Freq: Four times a day (QID) | ORAL | Status: DC | PRN
  Administered 2019-08-07 (×2): 50 mg via ORAL
  Administered 2019-08-08: 100 mg via ORAL
  Filled 2019-08-07 (×2): qty 1
  Filled 2019-08-07: qty 2

## 2019-08-07 MED ORDER — SODIUM CHLORIDE (PF) 0.9 % IJ SOLN
INTRAMUSCULAR | Status: AC
  Filled 2019-08-07: qty 50

## 2019-08-07 MED ORDER — ACETAMINOPHEN 500 MG PO TABS
1000.0000 mg | ORAL_TABLET | Freq: Four times a day (QID) | ORAL | Status: DC
  Administered 2019-08-07 – 2019-08-08 (×3): 1000 mg via ORAL
  Filled 2019-08-07 (×3): qty 2

## 2019-08-07 MED ORDER — ORAL CARE MOUTH RINSE: 15.0000 mL | Freq: Once | OROMUCOSAL | Status: AC

## 2019-08-07 MED ORDER — CEFAZOLIN SODIUM-DEXTROSE 2-4 GM/100ML-% IV SOLN
2.0000 g | Freq: Four times a day (QID) | INTRAVENOUS | Status: AC
  Administered 2019-08-07 (×2): 2 g via INTRAVENOUS
  Filled 2019-08-07 (×2): qty 100

## 2019-08-07 MED ORDER — ACETAMINOPHEN 10 MG/ML IV SOLN
1000.0000 mg | Freq: Four times a day (QID) | INTRAVENOUS | Status: DC
  Filled 2019-08-07: qty 100

## 2019-08-07 MED ORDER — DEXAMETHASONE SODIUM PHOSPHATE 10 MG/ML IJ SOLN
10.0000 mg | Freq: Once | INTRAMUSCULAR | Status: AC
  Administered 2019-08-08: 10 mg via INTRAVENOUS
  Filled 2019-08-07: qty 1

## 2019-08-07 MED ORDER — HYDROMORPHONE HCL 1 MG/ML IJ SOLN: 0.2500 mg | INTRAMUSCULAR | Status: DC | PRN

## 2019-08-07 MED ORDER — MIDAZOLAM HCL 2 MG/2ML IJ SOLN
0.5000 mg | Freq: Once | INTRAMUSCULAR | Status: AC
  Administered 2019-08-07: 1 mg via INTRAVENOUS
  Filled 2019-08-07: qty 2

## 2019-08-07 MED ORDER — DEXAMETHASONE SODIUM PHOSPHATE 4 MG/ML IJ SOLN
INTRAMUSCULAR | Status: DC | PRN
Start: 2019-08-07 — End: 2019-08-07
  Administered 2019-08-07: 10 mg via INTRAVENOUS

## 2019-08-07 MED ORDER — EPHEDRINE 5 MG/ML INJ
INTRAVENOUS | Status: AC
  Filled 2019-08-07: qty 10

## 2019-08-07 MED ORDER — POLYETHYLENE GLYCOL 3350 17 G PO PACK: 17.0000 g | PACK | Freq: Every day | ORAL | Status: DC | PRN

## 2019-08-07 MED ORDER — TRANEXAMIC ACID-NACL 1000-0.7 MG/100ML-% IV SOLN
1000.0000 mg | INTRAVENOUS | Status: AC
  Administered 2019-08-07: 1000 mg via INTRAVENOUS
  Filled 2019-08-07: qty 100

## 2019-08-07 MED ORDER — ONDANSETRON HCL 4 MG PO TABS: 4.0000 mg | ORAL_TABLET | Freq: Four times a day (QID) | ORAL | Status: DC | PRN

## 2019-08-07 MED ORDER — METHOCARBAMOL 500 MG IVPB - SIMPLE MED
500.0000 mg | Freq: Four times a day (QID) | INTRAVENOUS | Status: DC | PRN
  Filled 2019-08-07: qty 50

## 2019-08-07 MED ORDER — EPHEDRINE SULFATE 50 MG/ML IJ SOLN
INTRAMUSCULAR | Status: DC | PRN
  Administered 2019-08-07: 5 mg via INTRAVENOUS

## 2019-08-07 MED ORDER — SODIUM CHLORIDE (PF) 0.9 % IJ SOLN
INTRAMUSCULAR | Status: DC | PRN
  Administered 2019-08-07: 60 mL

## 2019-08-07 MED ORDER — CHLORHEXIDINE GLUCONATE 0.12 % MT SOLN
15.0000 mL | Freq: Once | OROMUCOSAL | Status: AC
  Administered 2019-08-07: 15 mL via OROMUCOSAL

## 2019-08-07 MED ORDER — PHENYLEPHRINE HCL-NACL 10-0.9 MG/250ML-% IV SOLN
INTRAVENOUS | Status: DC | PRN
Start: 2019-08-07 — End: 2019-08-07
  Administered 2019-08-07: 30 ug/min via INTRAVENOUS

## 2019-08-07 MED ORDER — ATORVASTATIN CALCIUM 20 MG PO TABS: 20.0000 mg | ORAL_TABLET | Freq: Every day | ORAL | Status: DC

## 2019-08-07 MED ORDER — BUPIVACAINE HCL (PF) 0.5 % IJ SOLN
INTRAMUSCULAR | Status: DC | PRN
Start: 2019-08-07 — End: 2019-08-07
  Administered 2019-08-07: 20 mL via PERINEURAL

## 2019-08-07 MED ORDER — DEXAMETHASONE SODIUM PHOSPHATE 10 MG/ML IJ SOLN: 8.0000 mg | Freq: Once | INTRAMUSCULAR | Status: DC

## 2019-08-07 MED ORDER — OXYCODONE HCL 5 MG PO TABS
5.0000 mg | ORAL_TABLET | ORAL | Status: DC | PRN
  Administered 2019-08-07: 5 mg via ORAL
  Filled 2019-08-07: qty 1

## 2019-08-07 MED ORDER — SODIUM CHLORIDE 0.9 % IV SOLN: INTRAVENOUS | Status: DC

## 2019-08-07 MED ORDER — PROPOFOL 500 MG/50ML IV EMUL
INTRAVENOUS | Status: DC | PRN
  Administered 2019-08-07: 75 ug/kg/min via INTRAVENOUS

## 2019-08-07 MED ORDER — FLEET ENEMA 7-19 GM/118ML RE ENEM: 1.0000 | ENEMA | Freq: Once | RECTAL | Status: DC | PRN

## 2019-08-07 MED ORDER — RIVAROXABAN 10 MG PO TABS
10.0000 mg | ORAL_TABLET | Freq: Every day | ORAL | Status: DC
  Administered 2019-08-08: 10 mg via ORAL
  Filled 2019-08-07: qty 1

## 2019-08-07 MED ORDER — STERILE WATER FOR IRRIGATION IR SOLN
Status: DC | PRN
  Administered 2019-08-07: 2000 mL

## 2019-08-07 MED ORDER — ONDANSETRON HCL 4 MG/2ML IJ SOLN
INTRAMUSCULAR | Status: DC | PRN
  Administered 2019-08-07: 4 mg via INTRAVENOUS

## 2019-08-07 MED ORDER — PHENYLEPHRINE HCL (PRESSORS) 10 MG/ML IV SOLN
INTRAVENOUS | Status: DC | PRN
  Administered 2019-08-07 (×6): 80 ug via INTRAVENOUS

## 2019-08-07 MED ORDER — LACTATED RINGERS IV SOLN: INTRAVENOUS | Status: DC

## 2019-08-07 MED ORDER — METOCLOPRAMIDE HCL 5 MG/ML IJ SOLN: 5.0000 mg | Freq: Three times a day (TID) | INTRAMUSCULAR | Status: DC | PRN

## 2019-08-07 MED ORDER — ONDANSETRON HCL 4 MG/2ML IJ SOLN: 4.0000 mg | Freq: Four times a day (QID) | INTRAMUSCULAR | Status: DC | PRN

## 2019-08-07 MED ORDER — PHENOL 1.4 % MT LIQD: 1.0000 | OROMUCOSAL | Status: DC | PRN

## 2019-08-07 MED ORDER — BUPIVACAINE IN DEXTROSE 0.75-8.25 % IT SOLN
INTRATHECAL | Status: DC | PRN
Start: 2019-08-07 — End: 2019-08-07
  Administered 2019-08-07: 1.6 mL via INTRATHECAL

## 2019-08-07 MED ORDER — AMLODIPINE BESYLATE 5 MG PO TABS
7.5000 mg | ORAL_TABLET | Freq: Every day | ORAL | Status: DC
  Administered 2019-08-08: 7.5 mg via ORAL
  Filled 2019-08-07: qty 2

## 2019-08-07 MED ORDER — SODIUM CHLORIDE (PF) 0.9 % IJ SOLN
INTRAMUSCULAR | Status: AC
  Filled 2019-08-07: qty 10

## 2019-08-07 MED ORDER — DOCUSATE SODIUM 100 MG PO CAPS
100.0000 mg | ORAL_CAPSULE | Freq: Two times a day (BID) | ORAL | Status: DC
  Administered 2019-08-07 – 2019-08-08 (×2): 100 mg via ORAL
  Filled 2019-08-07 (×2): qty 1

## 2019-08-07 MED ORDER — DIPHENHYDRAMINE HCL 12.5 MG/5ML PO ELIX: 12.5000 mg | ORAL_SOLUTION | ORAL | Status: DC | PRN

## 2019-08-07 MED ORDER — POVIDONE-IODINE 10 % EX SWAB: 2.0000 "application " | Freq: Once | CUTANEOUS | Status: DC

## 2019-08-07 MED ORDER — BISACODYL 10 MG RE SUPP: 10.0000 mg | Freq: Every day | RECTAL | Status: DC | PRN

## 2019-08-07 SURGICAL SUPPLY — 52 items
ATTUNE MED DOME PAT 41 KNEE (Knees) ×2 IMPLANT
ATTUNE PS FEM RT SZ 8 CEM KNEE (Femur) ×2 IMPLANT
ATTUNE PSRP INSR SZ8 6 KNEE (Insert) ×2 IMPLANT
BAG ZIPLOCK 12X15 (MISCELLANEOUS) ×2 IMPLANT
BASE TIBIAL ROT PLAT SZ 8 KNEE (Knees) ×1 IMPLANT
BLADE SAG 18X100X1.27 (BLADE) ×2 IMPLANT
BLADE SAW SGTL 11.0X1.19X90.0M (BLADE) ×2 IMPLANT
BLADE SURG SZ10 CARB STEEL (BLADE) ×4 IMPLANT
BNDG ELASTIC 6X5.8 VLCR STR LF (GAUZE/BANDAGES/DRESSINGS) ×2 IMPLANT
BOWL SMART MIX CTS (DISPOSABLE) ×2 IMPLANT
CEMENT HV SMART SET (Cement) ×4 IMPLANT
COVER SURGICAL LIGHT HANDLE (MISCELLANEOUS) ×2 IMPLANT
COVER WAND RF STERILE (DRAPES) IMPLANT
CUFF TOURN SGL QUICK 34 (TOURNIQUET CUFF) ×1
CUFF TRNQT CYL 34X4.125X (TOURNIQUET CUFF) ×1 IMPLANT
DECANTER SPIKE VIAL GLASS SM (MISCELLANEOUS) ×2 IMPLANT
DRAPE U-SHAPE 47X51 STRL (DRAPES) ×2 IMPLANT
DRSG AQUACEL AG ADV 3.5X10 (GAUZE/BANDAGES/DRESSINGS) ×2 IMPLANT
DURAPREP 26ML APPLICATOR (WOUND CARE) ×2 IMPLANT
ELECT REM PT RETURN 15FT ADLT (MISCELLANEOUS) ×2 IMPLANT
GLOVE BIO SURGEON STRL SZ7 (GLOVE) ×2 IMPLANT
GLOVE BIO SURGEON STRL SZ8 (GLOVE) ×2 IMPLANT
GLOVE BIOGEL PI IND STRL 7.0 (GLOVE) ×1 IMPLANT
GLOVE BIOGEL PI IND STRL 8 (GLOVE) ×1 IMPLANT
GLOVE BIOGEL PI INDICATOR 7.0 (GLOVE) ×1
GLOVE BIOGEL PI INDICATOR 8 (GLOVE) ×1
GOWN STRL REUS W/TWL LRG LVL3 (GOWN DISPOSABLE) ×4 IMPLANT
HANDPIECE INTERPULSE COAX TIP (DISPOSABLE) ×1
HOLDER FOLEY CATH W/STRAP (MISCELLANEOUS) ×2 IMPLANT
IMMOBILIZER KNEE 20 (SOFTGOODS) ×2
IMMOBILIZER KNEE 20 THIGH 36 (SOFTGOODS) ×1 IMPLANT
KIT TURNOVER KIT A (KITS) IMPLANT
MANIFOLD NEPTUNE II (INSTRUMENTS) ×2 IMPLANT
NS IRRIG 1000ML POUR BTL (IV SOLUTION) ×2 IMPLANT
PACK TOTAL KNEE CUSTOM (KITS) ×2 IMPLANT
PADDING CAST COTTON 6X4 STRL (CAST SUPPLIES) ×4 IMPLANT
PENCIL SMOKE EVACUATOR (MISCELLANEOUS) ×2 IMPLANT
PIN DRILL FIX HALF THREAD (BIT) ×2 IMPLANT
PIN STEINMAN FIXATION KNEE (PIN) ×2 IMPLANT
PROTECTOR NERVE ULNAR (MISCELLANEOUS) ×2 IMPLANT
SET HNDPC FAN SPRY TIP SCT (DISPOSABLE) ×1 IMPLANT
STRIP CLOSURE SKIN 1/2X4 (GAUZE/BANDAGES/DRESSINGS) ×4 IMPLANT
SUT MNCRL AB 4-0 PS2 18 (SUTURE) ×2 IMPLANT
SUT STRATAFIX 0 PDS 27 VIOLET (SUTURE) ×2
SUT VIC AB 2-0 CT1 27 (SUTURE) ×4
SUT VIC AB 2-0 CT1 TAPERPNT 27 (SUTURE) ×4 IMPLANT
SUTURE STRATFX 0 PDS 27 VIOLET (SUTURE) ×1 IMPLANT
TIBIAL BASE ROT PLAT SZ 8 KNEE (Knees) ×2 IMPLANT
TRAY FOLEY MTR SLVR 16FR STAT (SET/KITS/TRAYS/PACK) ×2 IMPLANT
WATER STERILE IRR 1000ML POUR (IV SOLUTION) ×4 IMPLANT
WRAP KNEE MAXI GEL POST OP (GAUZE/BANDAGES/DRESSINGS) ×2 IMPLANT
YANKAUER SUCT BULB TIP 10FT TU (MISCELLANEOUS) ×2 IMPLANT

## 2019-08-07 NOTE — Discharge Instructions (Addendum)
 Frank Aluisio, MD Total Joint Specialist [facility] [address] [place], [place] [address] [phone]  TOTAL KNEE REPLACEMENT POSTOPERATIVE DIRECTIONS    Knee Rehabilitation, Guidelines Following Surgery  Results after knee surgery are often greatly improved when you follow the exercise, range of motion and muscle strengthening exercises prescribed by your doctor. Safety measures are also important to protect the knee from further injury. If any of these exercises cause you to have increased pain or swelling in your knee joint, decrease the amount until you are comfortable again and slowly increase them. If you have problems or questions, call your caregiver or physical therapist for advice.   BLOOD CLOT PREVENTION . Take a 10 mg Xarelto once a day for three weeks following surgery. Then resume one 81 mg aspirin once a day. . You may resume your vitamins/supplements once you have discontinued the Xarelto. . Do not take any NSAIDs (Advil, Aleve, Ibuprofen, Meloxicam, etc.) until you have discontinued the Xarelto.   HOME CARE INSTRUCTIONS  . Remove items at home which could result in a fall. This includes throw rugs or furniture in walking pathways.  . ICE to the affected knee as much as tolerated. Icing helps control swelling. If the swelling is well controlled you will be more comfortable and rehab easier. Continue to use ice on the knee for pain and swelling from surgery. You may notice swelling that will progress down to the foot and ankle. This is normal after surgery. Elevate the leg when you are not up walking on it.    . Continue to use the breathing machine which will help keep your temperature down. It is common for your temperature to cycle up and down following surgery, especially at night when you are not up moving around and exerting yourself. The breathing machine keeps your lungs expanded and your temperature down. . Do not place pillow under  the operative knee, focus on keeping the knee straight while resting  DIET You may resume your previous home diet once you are discharged from the hospital.  DRESSING / WOUND CARE / SHOWERING . Keep your bulky bandage on for 2 days. On the third post-operative day you may remove the Ace bandage and gauze. There is a waterproof adhesive bandage on your skin which will stay in place until your first follow-up appointment. Once you remove this you will not need to place another bandage . You may begin showering 3 days following surgery, but do not submerge the incision under water.  ACTIVITY For the first 5 days, the key is rest and control of pain and swelling . Do your home exercises twice a day starting on post-operative day 3. On the days you go to physical therapy, just do the home exercises once that day. . You should rest, ice and elevate the leg for 50 minutes out of every hour. Get up and walk/stretch for 10 minutes per hour. After 5 days you can increase your activity slowly as tolerated. . Walk with your walker as instructed. Use the walker until you are comfortable transitioning to a cane. Walk with the cane in the opposite hand of the operative leg. You may discontinue the cane once you are comfortable and walking steadily. . Avoid periods of inactivity such as sitting longer than an hour when not asleep. This helps prevent blood clots.  . You may discontinue the knee immobilizer once you are able to perform a straight leg raise while lying down. . You   may resume a sexual relationship in one month or when given the OK by your doctor.  . You may return to work once you are cleared by your doctor.  . Do not drive a car for 6 weeks or until released by your surgeon.  . Do not drive while taking narcotics.  TED HOSE STOCKINGS Wear the elastic stockings on both legs for three weeks following surgery during the day. You may remove them at night for sleeping.  WEIGHT BEARING Weight  bearing as tolerated with assist device (walker, cane, etc) as directed, use it as long as suggested by your surgeon or therapist, typically at least 4-6 weeks.  POSTOPERATIVE CONSTIPATION PROTOCOL Constipation - defined medically as fewer than three stools per week and severe constipation as less than one stool per week.  One of the most common issues patients have following surgery is constipation.  Even if you have a regular bowel pattern at home, your normal regimen is likely to be disrupted due to multiple reasons following surgery.  Combination of anesthesia, postoperative narcotics, change in appetite and fluid intake all can affect your bowels.  In order to avoid complications following surgery, here are some recommendations in order to help you during your recovery period.  . Colace (docusate) - Pick up an over-the-counter form of Colace or another stool softener and take twice a day as long as you are requiring postoperative pain medications.  Take with a full glass of water daily.  If you experience loose stools or diarrhea, hold the colace until you stool forms back up. If your symptoms do not get better within 1 week or if they get worse, check with your doctor. . Dulcolax (bisacodyl) - Pick up over-the-counter and take as directed by the product packaging as needed to assist with the movement of your bowels.  Take with a full glass of water.  Use this product as needed if not relieved by Colace only.  . MiraLax (polyethylene glycol) - Pick up over-the-counter to have on hand. MiraLax is a solution that will increase the amount of water in your bowels to assist with bowel movements.  Take as directed and can mix with a glass of water, juice, soda, coffee, or tea. Take if you go more than two days without a movement. Do not use MiraLax more than once per day. Call your doctor if you are still constipated or irregular after using this medication for 7 days in a row.  If you continue to have  problems with postoperative constipation, please contact the office for further assistance and recommendations.  If you experience "the worst abdominal pain ever" or develop nausea or vomiting, please contact the office immediatly for further recommendations for treatment.  ITCHING If you experience itching with your medications, try taking only a single pain pill, or even half a pain pill at a time.  You can also use Benadryl over the counter for itching or also to help with sleep.   MEDICATIONS See your medication summary on the "After Visit Summary" that the nursing staff will review with you prior to discharge.  You may have some home medications which will be placed on hold until you complete the course of blood thinner medication.  It is important for you to complete the blood thinner medication as prescribed by your surgeon.  Continue your approved medications as instructed at time of discharge.  PRECAUTIONS . If you experience chest pain or shortness of breath - call 911 immediately for transfer   to the hospital emergency department.  . If you develop a fever greater that 101 F, purulent drainage from wound, increased redness or drainage from wound, foul odor from the wound/dressing, or calf pain - CONTACT YOUR SURGEON.                                                   FOLLOW-UP APPOINTMENTS Make sure you keep all of your appointments after your operation with your surgeon and caregivers. You should call the office at the above phone number and make an appointment for approximately two weeks after the date of your surgery or on the date instructed by your surgeon outlined in the "After Visit Summary".  RANGE OF MOTION AND STRENGTHENING EXERCISES  Rehabilitation of the knee is important following a knee injury or an operation. After just a few days of immobilization, the muscles of the thigh which control the knee become weakened and shrink (atrophy). Knee exercises are designed to build up the  tone and strength of the thigh muscles and to improve knee motion. Often times heat used for twenty to thirty minutes before working out will loosen up your tissues and help with improving the range of motion but do not use heat for the first two weeks following surgery. These exercises can be done on a training (exercise) mat, on the floor, on a table or on a bed. Use what ever works the best and is most comfortable for you Knee exercises include:  . Leg Lifts - While your knee is still immobilized in a splint or cast, you can do straight leg raises. Lift the leg to 60 degrees, hold for 3 sec, and slowly lower the leg. Repeat 10-20 times 2-3 times daily. Perform this exercise against resistance later as your knee gets better.  . Quad and Hamstring Sets - Tighten up the muscle on the front of the thigh (Quad) and hold for 5-10 sec. Repeat this 10-20 times hourly. Hamstring sets are done by pushing the foot backward against an object and holding for 5-10 sec. Repeat as with quad sets.   Leg Slides: Lying on your back, slowly slide your foot toward your buttocks, bending your knee up off the floor (only go as far as is comfortable). Then slowly slide your foot back down until your leg is flat on the floor again.  Angel Wings: Lying on your back spread your legs to the side as far apart as you can without causing discomfort.  A rehabilitation program following serious knee injuries can speed recovery and prevent re-injury in the future due to weakened muscles. Contact your doctor or a physical therapist for more information on knee rehabilitation.   IF YOU ARE TRANSFERRED TO A SKILLED REHAB FACILITY If the patient is transferred to a skilled rehab facility following release from the hospital, a list of the current medications will be sent to the facility for the patient to continue.  When discharged from the skilled rehab facility, please have the facility set up the patient's Home Health Physical Therapy  prior to being released. Also, the skilled facility will be responsible for providing the patient with their medications at time of release from the facility to include their pain medication, the muscle relaxants, and their blood thinner medication. If the patient is still at the rehab facility at time of the two   week follow up appointment, the skilled rehab facility will also need to assist the patient in arranging follow up appointment in our office and any transportation needs.  MAKE SURE YOU:  . Understand these instructions.  . Get help right away if you are not doing well or get worse.   DENTAL ANTIBIOTICS:  In most cases prophylactic antibiotics for Dental procdeures after total joint surgery are not necessary.  Exceptions are as follows:  1. History of prior total joint infection  2. Severely immunocompromised (Organ Transplant, cancer chemotherapy, Rheumatoid biologic meds such as Humera)  3. Poorly controlled diabetes (A1C &gt; 8.0, blood glucose over 200)  If you have one of these conditions, contact your surgeon for an antibiotic prescription, prior to your dental procedure.    Pick up stool softner and laxative for home use following surgery while on pain medications. Do not submerge incision under water. Please use good hand washing techniques while changing dressing each day. May shower starting three days after surgery. Please use a clean towel to pat the incision dry following showers. Continue to use ice for pain and swelling after surgery. Do not use any lotions or creams on the incision until instructed by your surgeon.  Information on my medicine - XARELTO (Rivaroxaban)   Why was Xarelto prescribed for you? Xarelto was prescribed for you to reduce the risk of blood clots forming after orthopedic surgery. The medical term for these abnormal blood clots is venous thromboembolism (VTE).  What do you need to know about xarelto ? Take your Xarelto ONCE DAILY  at the same time every day. You may take it either with or without food.  If you have difficulty swallowing the tablet whole, you may crush it and mix in applesauce just prior to taking your dose.  Take Xarelto exactly as prescribed by your doctor and DO NOT stop taking Xarelto without talking to the doctor who prescribed the medication.  Stopping without other VTE prevention medication to take the place of Xarelto may increase your risk of developing a clot.  After discharge, you should have regular check-up appointments with your healthcare provider that is prescribing your Xarelto.    What do you do if you miss a dose? If you miss a dose, take it as soon as you remember on the same day then continue your regularly scheduled once daily regimen the next day. Do not take two doses of Xarelto on the same day.   Important Safety Information A possible side effect of Xarelto is bleeding. You should call your healthcare provider right away if you experience any of the following: ? Bleeding from an injury or your nose that does not stop. ? Unusual colored urine (red or dark brown) or unusual colored stools (red or black). ? Unusual bruising for unknown reasons. ? A serious fall or if you hit your head (even if there is no bleeding).  Some medicines may interact with Xarelto and might increase your risk of bleeding while on Xarelto. To help avoid this, consult your healthcare provider or pharmacist prior to using any new prescription or non-prescription medications, including herbals, vitamins, non-steroidal anti-inflammatory drugs (NSAIDs) and supplements.  This website has more information on Xarelto: www.xarelto.com.     

## 2019-08-07 NOTE — Anesthesia Procedure Notes (Signed)
Anesthesia Regional Block: Adductor canal block   Pre-Anesthetic Checklist: ,, timeout performed, Correct Patient, Correct Site, Correct Laterality, Correct Procedure, Correct Position, site marked, Risks and benefits discussed,  Surgical consent,  Pre-op evaluation,  At surgeon's request and post-op pain management  Laterality: Right  Prep: chloraprep       Needles:  Injection technique: Single-shot  Needle Type: Echogenic Needle     Needle Length: 9cm      Additional Needles:   Procedures:,,,, ultrasound used (permanent image in chart),,,,  Narrative:  Start time: 08/07/2019 9:00 AM End time: 08/07/2019 9:05 AM Injection made incrementally with aspirations every 5 mL.  Performed by: Personally  Anesthesiologist: Myrtie Soman, MD  Additional Notes: Patient tolerated the procedure well without complications

## 2019-08-07 NOTE — Interval H&P Note (Signed)
History and Physical Interval Note:  08/07/2019 8:32 AM  [name]  has presented today for surgery, with the diagnosis of right knee osteoarthritis.  The various methods of treatment have been discussed with the patient and family. After consideration of risks, benefits and other options for treatment, the patient has consented to  Procedure(s): TOTAL KNEE ARTHROPLASTY (Right) as a surgical intervention.  The patient's history has been reviewed, patient examined, no change in status, stable for surgery.  I have reviewed the patient's chart and labs.  Questions were answered to the patient's satisfaction.     Pilar Plate Olson Lucarelli

## 2019-08-07 NOTE — Anesthesia Procedure Notes (Signed)
Anesthesia Procedure Image    

## 2019-08-07 NOTE — Op Note (Signed)
OPERATIVE REPORT-TOTAL KNEE ARTHROPLASTY   Pre-operative diagnosis- Osteoarthritis  Right knee(s)  Post-operative diagnosis- Osteoarthritis Right knee(s)  Procedure-  Right  Total Knee Arthroplasty  Surgeon- Dione Plover. Bonham Zingale, MD  Assistant- Griffith Citron, PA-C   Anesthesia-  Adductor canal block and spinal  EBL-25 mL   Drains Hemovac  Tourniquet time-  Total Tourniquet Time Documented: Thigh (Right) - 49 minutes Total: Thigh (Right) - 49 minutes     Complications- None  Condition-PACU - hemodynamically stable.   Brief Clinical Note  [name] is a 79 y.o. year old male with end stage OA of his right knee with progressively worsening pain and dysfunction. He has constant pain, with activity and at rest and significant functional deficits with difficulties even with ADLs. He has had extensive non-op management including analgesics, injections of cortisone and viscosupplements, and home exercise program, but remains in significant pain with significant dysfunction. Radiographs show bone on bone arthritis medial and patellfemoral. He presents now for right Total Knee Arthroplasty.    Procedure in detail---   The patient is brought into the operating room and positioned supine on the operating table. After successful administration of  Adductor canal block and spinal,   a tourniquet is placed high on the  Right thigh(s) and the lower extremity is prepped and draped in the usual sterile fashion. Time out is performed by the operating team and then the  Right lower extremity is wrapped in Esmarch, knee flexed and the tourniquet inflated to 300 mmHg.       A midline incision is made with a ten blade through the subcutaneous tissue to the level of the extensor mechanism. A fresh blade is used to make a medial parapatellar arthrotomy. Soft tissue over the proximal medial tibia is subperiosteally elevated to the joint line with a knife and into the semimembranosus bursa with a Cobb  elevator. Soft tissue over the proximal lateral tibia is elevated with attention being paid to avoiding the patellar tendon on the tibial tubercle. The patella is everted, knee flexed 90 degrees and the ACL and PCL are removed. Findings are bone on bone medial and patellofemoral with massive global osteophytes.        The drill is used to create a starting hole in the distal femur and the canal is thoroughly irrigated with sterile saline to remove the fatty contents. The 5 degree Right  valgus alignment guide is placed into the femoral canal and the distal femoral cutting block is pinned to remove 11 mm off the distal femur. Resection is made with an oscillating saw.      The tibia is subluxed forward and the menisci are removed. The extramedullary alignment guide is placed referencing proximally at the medial aspect of the tibial tubercle and distally along the second metatarsal axis and tibial crest. The block is pinned to remove 4mm off the more deficient medial  side. Resection is made with an oscillating saw. Size 8 is the most appropriate size for the tibia and the proximal tibia is prepared with the modular drill and keel punch for that size.      The femoral sizing guide is placed and size 8 is most appropriate. Rotation is marked off the epicondylar axis and confirmed by creating a rectangular flexion gap at 90 degrees. The size 8 cutting block is pinned in this rotation and the anterior, posterior and chamfer cuts are made with the oscillating saw. The intercondylar block is then placed and that cut is made.  Trial size 8 tibial component, trial size 8 posterior stabilized femur and a 6  mm posterior stabilized rotating platform insert trial is placed. Full extension is achieved with excellent varus/valgus and anterior/posterior balance throughout full range of motion. The patella is everted and thickness measured to be 27  mm. Free hand resection is taken to 15 mm, a 41 template is placed, lug  holes are drilled, trial patella is placed, and it tracks normally. Osteophytes are removed off the posterior femur with the trial in place. All trials are removed and the cut bone surfaces prepared with pulsatile lavage. Cement is mixed and once ready for implantation, the size 8 tibial implant, size  8 posterior stabilized femoral component, and the size 41 patella are cemented in place and the patella is held with the clamp. The trial insert is placed and the knee held in full extension. The Exparel (20 ml mixed with 60 ml saline) is injected into the extensor mechanism, posterior capsule, medial and lateral gutters and subcutaneous tissues.  All extruded cement is removed and once the cement is hard the permanent 6 mm posterior stabilized rotating platform insert is placed into the tibial tray.      The wound is copiously irrigated with saline solution and the extensor mechanism closed over a hemovac drain with #1 V-loc suture. The tourniquet is released for a total tourniquet time of 49  minutes. Flexion against gravity is 140 degrees and the patella tracks normally. Subcutaneous tissue is closed with 2.0 vicryl and subcuticular with running 4.0 Monocryl. The incision is cleaned and dried and steri-strips and a bulky sterile dressing are applied. The limb is placed into a knee immobilizer and the patient is awakened and transported to recovery in stable condition.      Please note that a surgical assistant was a medical necessity for this procedure in order to perform it in a safe and expeditious manner. Surgical assistant was necessary to retract the ligaments and vital neurovascular structures to prevent injury to them and also necessary for proper positioning of the limb to allow for anatomic placement of the prosthesis.   Dione Plover Kehinde Bowdish, MD    08/07/2019, 10:55 AM

## 2019-08-07 NOTE — Anesthesia Postprocedure Evaluation (Signed)
Anesthesia Post Note  Patient: [name]  Procedure(s) Performed: TOTAL KNEE ARTHROPLASTY (Right Knee)     Patient location during evaluation: PACU Anesthesia Type: Spinal Level of consciousness: oriented and awake and alert Pain management: pain level controlled Vital Signs Assessment: post-procedure vital signs reviewed and stable Respiratory status: spontaneous breathing, respiratory function stable and patient connected to nasal cannula oxygen Cardiovascular status: blood pressure returned to baseline and stable Postop Assessment: no headache, no backache and no apparent nausea or vomiting Anesthetic complications: no   No complications documented.  Last Vitals:  Vitals:   08/07/19 1330 08/07/19 1400  BP: 118/78 125/68  Pulse: 70 76  Resp: 11 15  Temp:  36.6 C  SpO2: 99% 99%    Last Pain:  Vitals:   08/07/19 1400  TempSrc: Oral  PainSc: 0-No pain                 [name]

## 2019-08-07 NOTE — Progress Notes (Signed)
Orthopedic Tech Progress Note Patient Details:  [name] [birth year] [id number]  CPM Right Knee CPM Right Knee: On Right Knee Flexion (Degrees): 40 Right Knee Extension (Degrees): 10  Post Interventions Patient Tolerated: Well Instructions Provided: Care of device  [name] 08/07/2019, 11:57 AM

## 2019-08-07 NOTE — Progress Notes (Signed)
Assisted Dr. Rose with right, ultrasound guided, adductor canal block. Side rails up, monitors on throughout procedure. See vital signs in flow sheet. Tolerated Procedure well.  

## 2019-08-07 NOTE — Evaluation (Signed)
Physical Therapy Evaluation Patient Details Name: [name] MRN: [id number] DOB: 03/15/1940 Today's Date: 08/07/2019   History of Present Illness  Patient is 79 y.o. male s/p Rt TKA on 08/07/19 with PMH significant for HTN, CHF, gout,  CAD, anemia, TEE with cardioversion, CABG in 2019.    Clinical Impression  [name] is a 79 y.o. male POD 0 s/p Rt  TKA. Patient reports independence with mobility at baseline. Patient is now limited by functional impairments (see PT problem list below) and requires min assist for transfers and gait with RW. Patient was able to ambulate ~65 feet with RW and min assist. Patient instructed in exercise to facilitate circulation. Patient will benefit from continued skilled PT interventions to address impairments and progress towards PLOF. Acute PT will follow to progress mobility and stair training in preparation for safe discharge home.     Follow Up Recommendations Follow surgeon's recommendation for DC plan and follow-up therapies;Home health PT    Equipment Recommendations  3in1 (PT)    Recommendations for Other Services       Precautions / Restrictions Precautions Precautions: Fall Restrictions Weight Bearing Restrictions: No Other Position/Activity Restrictions: WBAT      Mobility  Bed Mobility Overal bed mobility: Needs Assistance Bed Mobility: Supine to Sit     Supine to sit: HOB elevated;Min assist     General bed mobility comments: cuse to use bed rail and scoot to EOB, assist to manage Rt LE in knee immobilizer.   Transfers Overall transfer level: Needs assistance Equipment used: Rolling walker (2 wheeled) Transfers: Sit to/from Stand Sit to Stand: Min assist;From elevated surface         General transfer comment: cues for technique with RW, assist to initiate and complete power up.  Ambulation/Gait Ambulation/Gait assistance: Min assist Gait Distance (Feet): 65 Feet Assistive device: Rolling walker (2  wheeled) Gait Pattern/deviations: Step-to pattern;Decreased stance time - right;Decreased weight shift to right Gait velocity: decreased   General Gait Details: ceus for safe step pattern and proximity to RW, intermittent assist required to reposition walker. no overt LOB noted.  Stairs     Wheelchair Mobility    Modified Rankin (Stroke Patients Only)       Balance Overall balance assessment: Needs assistance Sitting-balance support: Feet supported Sitting balance-Leahy Scale: Good     Standing balance support: During functional activity;Bilateral upper extremity supported Standing balance-Leahy Scale: Poor            Pertinent Vitals/Pain Pain Assessment: 0-10 Pain Score: 3  Pain Location: Rt knee Pain Descriptors / Indicators: Aching;Discomfort;Dull Pain Intervention(s): Limited activity within patient's tolerance;Monitored during session;Repositioned;Ice applied;Premedicated before session    Home Living Family/patient expects to be discharged to:: Private residence Living Arrangements: Spouse/significant other Available Help at Discharge: Family Type of Home: Apartment Home Access: Level entry     Home Layout: One Lindy: Environmental consultant - 2 wheels;Walker - 4 wheels;Cane - single point;Bedside commode;Grab bars - toilet;Grab bars - tub/shower;Hand held shower head Additional Comments: pt lives at [facility] ([place]) and is independent. His wife is dependent on him for assistance and has a parkinsons like disorder. He has hired help for 48 hours and can extend it as long as needed.    Prior Function Level of Independence: Independent         Comments: pt was riding ebike for exercise every day and doing prehab. He is also an avid disc golf player.      Hand Dominance  Dominant Hand: Right    Extremity/Trunk Assessment   Upper Extremity Assessment Upper Extremity Assessment: Overall WFL for tasks assessed    Lower  Extremity Assessment Lower Extremity Assessment: RLE deficits/detail RLE Deficits / Details: pt with some quad activation, extensor lag with SLR, knee immobilizer applied for mobility RLE Sensation: WNL RLE Coordination: WNL    Cervical / Trunk Assessment Cervical / Trunk Assessment: Normal  Communication   Communication: HOH  Cognition Arousal/Alertness: Awake/alert Behavior During Therapy: WFL for tasks assessed/performed Overall Cognitive Status: Within Functional Limits for tasks assessed           General Comments      Exercises Total Joint Exercises Ankle Circles/Pumps: AROM;Both;20 reps;Seated   Assessment/Plan    PT Assessment Patient needs continued PT services  PT Problem List Decreased range of motion;Decreased strength;Decreased activity tolerance;Decreased balance;Decreased mobility;Decreased knowledge of use of DME;Decreased knowledge of precautions       PT Treatment Interventions DME instruction;Gait training;Stair training;Functional mobility training;Therapeutic activities;Therapeutic exercise;Balance training;Patient/family education    PT Goals (Current goals can be found in the Care Plan section)  Acute Rehab PT Goals Patient Stated Goal: get home and back to biking and disc golf PT Goal Formulation: With patient Time For Goal Achievement: 08/12/19 Potential to Achieve Goals: Good    Frequency 7X/week    AM-PAC PT "6 Clicks" Mobility  Outcome Measure Help needed turning from your back to your side while in a flat bed without using bedrails?: A Little Help needed moving from lying on your back to sitting on the side of a flat bed without using bedrails?: A Little Help needed moving to and from a bed to a chair (including a wheelchair)?: A Little Help needed standing up from a chair using your arms (e.g., wheelchair or bedside chair)?: A Little Help needed to walk in hospital room?: A Little Help needed climbing 3-5 steps with a railing? : A  Little 6 Click Score: 18    End of Session Equipment Utilized During Treatment: Gait belt;Right knee immobilizer Activity Tolerance: Patient tolerated treatment well Patient left: in bed;with call bell/phone within reach;with chair alarm set Nurse Communication: Mobility status PT Visit Diagnosis: Muscle weakness (generalized) (M62.81);Difficulty in walking, not elsewhere classified (R26.2)    Time: 2263-3354 PT Time Calculation (min) (ACUTE ONLY): 24 min   Charges:   PT Evaluation $PT Eval Low Complexity: 1 Low PT Treatments $Gait Training: 8-22 mins       Verner Mould, DPT Acute Rehabilitation Services  Office [phone] Pager [phone]  08/07/2019 5:39 PM

## 2019-08-07 NOTE — Transfer of Care (Signed)
Immediate Anesthesia Transfer of Care Note  Patient: [name]  Procedure(s) Performed: TOTAL KNEE ARTHROPLASTY (Right Knee)  Patient Location: PACU  Anesthesia Type:Spinal and MAC combined with regional for post-op pain  Level of Consciousness: awake, patient cooperative and responds to stimulation  Airway & Oxygen Therapy: Patient Spontanous Breathing and Patient connected to face mask oxygen  Post-op Assessment: Report given to RN and Post -op Vital signs reviewed and stable  Post vital signs: Reviewed and stable  Last Vitals:  Vitals Value Taken Time  BP 106/77 08/07/19 1128  Temp    Pulse 74 08/07/19 1130  Resp 18 08/07/19 1130  SpO2 96 % 08/07/19 1130  Vitals shown include unvalidated device data.  Last Pain:  Vitals:   08/07/19 0847  TempSrc: Oral  PainSc:       Patients Stated Pain Goal: 4 (19/75/88 3254)  Complications: No complications documented.

## 2019-08-07 NOTE — Progress Notes (Signed)
Orthopedic Tech Progress Note Patient Details:  [name] 11/16/1940 [id number]  CPM Right Knee CPM Right Knee: Off Right Knee Flexion (Degrees): 40 Right Knee Extension (Degrees): 10  Post Interventions Patient Tolerated: Well Instructions Provided: Care of device  [name] 08/07/2019, 3:19 PM

## 2019-08-08 ENCOUNTER — Encounter (HOSPITAL_COMMUNITY): Payer: Self-pay | Admitting: Orthopedic Surgery

## 2019-08-08 DIAGNOSIS — M1711 Unilateral primary osteoarthritis, right knee: Secondary | ICD-10-CM | POA: Diagnosis not present

## 2019-08-08 LAB — BASIC METABOLIC PANEL
Anion gap: 7 (ref 5–15)
BUN: 14 mg/dL (ref 8–23)
CO2: 23 mmol/L (ref 22–32)
Calcium: 8.9 mg/dL (ref 8.9–10.3)
Chloride: 102 mmol/L (ref 98–111)
Creatinine, Ser: 0.65 mg/dL (ref 0.61–1.24)
GFR calc Af Amer: >60 mL/min (ref >60)
GFR calc non Af Amer: >60 mL/min (ref >60)
Glucose, Bld: 140 mg/dL — ABNORMAL HIGH (ref 70–99)
Potassium: 4.3 mmol/L (ref 3.5–5.1)
Sodium: 132 mmol/L — ABNORMAL LOW (ref 135–145)

## 2019-08-08 LAB — CBC
HCT: 33.7 % — ABNORMAL LOW (ref 39.0–52.0)
Hemoglobin: 11.2 g/dL — ABNORMAL LOW (ref 13.0–17.0)
MCH: 31.8 pg (ref 26.0–34.0)
MCHC: 33.2 g/dL (ref 30.0–36.0)
MCV: 95.7 fL (ref 80.0–100.0)
Platelets: 141 10*3/uL — ABNORMAL LOW (ref 150–400)
RBC: 3.52 MIL/uL — ABNORMAL LOW (ref 4.22–5.81)
RDW: 12.7 % (ref 11.5–15.5)
WBC: 10.6 10*3/uL — ABNORMAL HIGH (ref 4.0–10.5)
nRBC: 0 % (ref 0.0–0.2)

## 2019-08-08 MED ORDER — OXYCODONE HCL 5 MG PO TABS: 5.0000 mg | ORAL_TABLET | Freq: Four times a day (QID) | ORAL | 0 refills | Status: DC | PRN

## 2019-08-08 MED ORDER — RIVAROXABAN 10 MG PO TABS: 10.0000 mg | ORAL_TABLET | Freq: Every day | ORAL | 0 refills | Status: DC

## 2019-08-08 MED ORDER — TRAMADOL HCL 50 MG PO TABS: 50.0000 mg | ORAL_TABLET | Freq: Four times a day (QID) | ORAL | 0 refills | Status: DC | PRN

## 2019-08-08 MED ORDER — METHOCARBAMOL 500 MG PO TABS: 500.0000 mg | ORAL_TABLET | Freq: Four times a day (QID) | ORAL | 0 refills | Status: DC | PRN

## 2019-08-08 NOTE — Progress Notes (Signed)
Physical Therapy Treatment Patient Details Name: [name] MRN: [id number] DOB: [birth year] Today's Date: 08/08/2019    History of Present Illness Patient is 79 y.o. male s/p Rt TKA on 08/07/19 with PMH significant for HTN, CHF, gout,  CAD, anemia, TEE with cardioversion, CABG in 2019.    PT Comments    Pt has met PT goals and is ready to DC home from PT standpoint. He ambulated 140' and demonstrates good understanding of TKA HEP.  Follow Up Recommendations  Follow surgeon's recommendation for DC plan and follow-up therapies;Home health PT     Equipment Recommendations  3in1 (PT)    Recommendations for Other Services       Precautions / Restrictions Precautions Precautions: Fall;Knee Precaution Booklet Issued: Yes (comment) Precaution Comments: reviewed no pillow under knee Restrictions Weight Bearing Restrictions: No Other Position/Activity Restrictions: WBAT    Mobility  Bed Mobility               General bed mobility comments: up in recliner  Transfers Overall transfer level: Needs assistance Equipment used: Rolling walker (2 wheeled) Transfers: Sit to/from Stand Sit to Stand: Supervision         General transfer comment: used armrests, VCs hand placement  Ambulation/Gait Ambulation/Gait assistance: Supervision Gait Distance (Feet): 140 Feet Assistive device: Rolling walker (2 wheeled) Gait Pattern/deviations: Step-to pattern;Decreased stance time - right;Decreased weight shift to right Gait velocity: decreased   General Gait Details: VCs sequencing, no loss of balance   Stairs             Wheelchair Mobility    Modified Rankin (Stroke Patients Only)       Balance Overall balance assessment: Needs assistance Sitting-balance support: Feet supported Sitting balance-Leahy Scale: Good     Standing balance support: During functional activity;Bilateral upper extremity supported Standing balance-Leahy Scale: Fair                               Cognition Arousal/Alertness: Awake/alert Behavior During Therapy: WFL for tasks assessed/performed Overall Cognitive Status: Within Functional Limits for tasks assessed                                        Exercises Total Joint Exercises Ankle Circles/Pumps: AROM;Both;20 reps;Seated Quad Sets: AROM;Both;5 reps;Seated Short Arc Quad: AROM;Right;10 reps;Supine Heel Slides: AAROM;Right;10 reps;Supine Hip ABduction/ADduction: AROM;Right;10 reps;Supine Straight Leg Raises: AROM;Right;10 reps;Supine Long Arc Quad: AROM;Right;10 reps;Seated Knee Flexion: AROM;Right;10 reps;Seated Goniometric ROM: 0-80* AAROM R knee    General Comments        Pertinent Vitals/Pain Pain Score: 3  Pain Location: Rt knee Pain Descriptors / Indicators: Aching;Discomfort;Dull Pain Intervention(s): Limited activity within patient's tolerance;Monitored during session;Premedicated before session;Ice applied    Home Living                      Prior Function            PT Goals (current goals can now be found in the care plan section) Acute Rehab PT Goals Patient Stated Goal: get home and back to biking and disc golf PT Goal Formulation: With patient Time For Goal Achievement: 08/12/19 Potential to Achieve Goals: Good Progress towards PT goals: Goals met/education completed, patient discharged from PT    Frequency    7X/week      PT Plan Current plan remains appropriate  Co-evaluation              AM-PAC PT "6 Clicks" Mobility   Outcome Measure  Help needed turning from your back to your side while in a flat bed without using bedrails?: None Help needed moving from lying on your back to sitting on the side of a flat bed without using bedrails?: None Help needed moving to and from a bed to a chair (including a wheelchair)?: None Help needed standing up from a chair using your arms (e.g., wheelchair or bedside chair)?: None Help  needed to walk in hospital room?: None Help needed climbing 3-5 steps with a railing? : None 6 Click Score: 24    End of Session Equipment Utilized During Treatment: Gait belt Activity Tolerance: Patient tolerated treatment well Patient left: with call bell/phone within reach;with chair alarm set;in chair Nurse Communication: Mobility status PT Visit Diagnosis: Muscle weakness (generalized) (M62.81);Difficulty in walking, not elsewhere classified (R26.2)     Time: 1829-9371 PT Time Calculation (min) (ACUTE ONLY): 38 min  Charges:  $Gait Training: 8-22 mins $Therapeutic Exercise: 8-22 mins $Therapeutic Activity: 8-22 mins                     Blondell Reveal Kistler PT 08/08/2019  Acute Rehabilitation Services Pager [phone] Office [phone]

## 2019-08-08 NOTE — TOC Transition Note (Signed)
Transition of Care [facility]) - CM/SW Discharge Note   Patient Details  Name: [name] MRN: [id number] Date of Birth: 02-20-1940  [facility] CM/SW Contact:  Lennart Pall, LCSW Phone Number: 08/08/2019, 9:26 AM   Clinical Narrative:    Met briefly with pt to confirm he has all needed DME and plan for f/u PT via [facility] at the [place] facility.  No TOC needs.   Final next level of care: [place] (return to [place] apt at [facility]) Barriers to Discharge: No Barriers Identified   Patient Goals and CMS Choice Patient states their goals for this hospitalization and ongoing recovery are:: go home      Discharge Placement                       Discharge Plan and Services                DME Arranged: N/A (has all needed DME) DME Agency: NA         HH Agency: Other - See comment ([facility] to provide PT)        Social Determinants of Health (SDOH) Interventions     Readmission Risk Interventions No flowsheet data found.

## 2019-08-08 NOTE — Progress Notes (Signed)
   Subjective: 1 Day Post-Op Procedure(s) (LRB): TOTAL KNEE ARTHROPLASTY (Right) Patient reports pain as mild.   Patient seen in rounds with Dr. Wynelle Link. Patient is well, and has had no acute complaints or problems. Pain well controlled with medications. Denies chest pain, SOB, or calf pain. Foley catheter to be removed this AM. No issues overnight.  We will continue therapy today, ambulated 48' yesterday.   Objective: Vital signs in last 24 hours: Temp:  [96.4 F (35.8 C)-98.6 F (37 C)] 98 F (36.7 C) (07/01 0549) Pulse Rate:  [62-80] 64 (07/01 0549) Resp:  [9-19] 16 (07/01 0549) BP: (98-141)/(50-84) 126/76 (07/01 0549) SpO2:  [96 %-100 %] 97 % (07/01 0549) Weight:  [83.9 kg] 83.9 kg (06/30 1402)  Intake/Output from previous day:  Intake/Output Summary (Last 24 hours) at 08/08/2019 0746 Last data filed at 08/08/2019 0600 Gross per 24 hour  Intake 3625.72 ml  Output 3325 ml  Net 300.72 ml     Intake/Output this shift: No intake/output data recorded.  Labs: Recent Labs    08/08/19 0250  HGB 11.2*   Recent Labs    08/08/19 0250  WBC 10.6*  RBC 3.52*  HCT 33.7*  PLT 141*   Recent Labs    08/08/19 0250  NA 132*  K 4.3  CL 102  CO2 23  BUN 14  CREATININE 0.65  GLUCOSE 140*  CALCIUM 8.9   No results for input(s): LABPT, INR in the last 72 hours.  Exam: General - Patient is Alert and Oriented Extremity - Neurologically intact Neurovascular intact Sensation intact distally Dorsiflexion/Plantar flexion intact Dressing - dressing C/D/I Motor Function - intact, moving foot and toes well on exam.   Past Medical History:  Diagnosis Date  . Anemia   . 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 CAD  . CHF (congestive heart failure) ([place])   . Gout   . Hepatitis 1964   hepatitis A   . HFrEF (heart failure with reduced ejection fraction) ([place])   . Hypertension   . Moderate mitral regurgitation   . Severe aortic stenosis     Assessment/Plan: 1 Day  Post-Op Procedure(s) (LRB): TOTAL KNEE ARTHROPLASTY (Right) Principal Problem:   OA (osteoarthritis) of knee Active Problems:   Primary osteoarthritis of right knee  Estimated body mass index is 25.8 kg/m as calculated from the following:   Height as of this encounter: 5\' 11"  (1.803 m).   Weight as of this encounter: 83.9 kg. Advance diet Up with therapy D/C IV fluids   Patient's anticipated LOS is less than 2 midnights, meeting these requirements: - Lives within 1 hour of care - Has a competent adult at home to recover with post-op recover - NO history of  - Chronic pain requiring opiods  - Diabetes  - Heart failure  - Heart attack  - Stroke  - DVT/VTE  - Cardiac arrhythmia  - Respiratory Failure/COPD  - Renal failure  - Anemia  - Advanced Liver disease  DVT Prophylaxis - Xarelto Weight bearing as tolerated. Continue therapy.  Plan is to go Home after hospital stay. Plan for discharge later today if cleared by physical therapy. Scheduled for therapy at [facility] through [place] Follow-up in the office on July 15  The [place] was reviewed today prior to any opioid medications being prescribed to this patient.   Theresa Duty, PA-C Orthopedic Surgery [phone] 08/08/2019, 7:46 AM

## 2019-09-19 ENCOUNTER — Encounter: Payer: Self-pay | Admitting: Cardiology

## 2019-09-19 ENCOUNTER — Ambulatory Visit: Payer: Medicare Other | Admitting: Cardiology

## 2019-09-19 ENCOUNTER — Other Ambulatory Visit: Payer: Self-pay

## 2019-09-19 VITALS — BP 125/69 | HR 75 | Resp 17 | Ht 71.0 in | Wt 180.0 lb

## 2019-09-19 DIAGNOSIS — I10 Essential (primary) hypertension: Secondary | ICD-10-CM

## 2019-09-19 DIAGNOSIS — I25810 Atherosclerosis of coronary artery bypass graft(s) without angina pectoris: Secondary | ICD-10-CM

## 2019-09-19 DIAGNOSIS — E782 Mixed hyperlipidemia: Secondary | ICD-10-CM

## 2019-09-19 DIAGNOSIS — Z952 Presence of prosthetic heart valve: Secondary | ICD-10-CM

## 2019-09-19 NOTE — Progress Notes (Signed)
Subjective:   [name], male    DOB: December 12, 1940, 79 y.o.   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  He recently underwent right knee replacement. Doing well, denies chest pain, shortness of breath, palpitations, leg edema, orthopnea, PND, TIA/syncope.    Current Outpatient Medications on File Prior to Visit  Medication Sig Dispense Refill  . acetaminophen (TYLENOL) 500 MG tablet Take 2 tablets (1,000 mg total) by mouth every 6 (six) hours as needed for mild pain or fever. 30 tablet 0  . allopurinol (ZYLOPRIM) 100 MG tablet Take 200 mg by mouth every morning.    Marland Kitchen amLODipine (NORVASC) 5 MG tablet TAKE 1 TABLET BY MOUTH EVERY DAY (Patient taking differently: Take 7.5 mg by mouth daily. ) 90 tablet 1  . atorvastatin (LIPITOR) 20 MG tablet Take 1 tablet (20 mg total) by mouth daily at 6 PM. 90 tablet 3  . loratadine (CLARITIN) 10 MG tablet Take 10 mg by mouth daily.    Marland Kitchen losartan (COZAAR) 25 MG tablet TAKE 1 TABLET BY MOUTH EVERY DAY 90 tablet 1  . psyllium (METAMUCIL) 58.6 % powder Take 1 packet by mouth daily as needed (regularity (3-4 times a week)).     Marland Kitchen tamsulosin (FLOMAX) 0.4 MG CAPS capsule Take 0.4 mg by mouth every evening.     . rivaroxaban (XARELTO) 10 MG TABS tablet Take 1 tablet (10 mg total) by mouth daily with breakfast for 20 days. Then resume one 81 mg aspirin once a day. 20 tablet 0   No current facility-administered medications on file prior to visit.    Cardiovascular studies:  EKG 09/19/2019: Sinus rhythm 75 bpm Right bundle branch block  Echocardiogram 02/20/2018: Left ventricle cavity is normal in size. Moderate concentric hypertrophy of the left ventricle. Normal diastolic filling pattern. Abnormal septal wall motion  due to post-operative coronary artery bypass graft. Visual EF is 45-50%. Left atrial cavity is moderate to severely dilated at 5.0 cm.  Bioprosthetic aortic valve with no regurgitation noted. No evidence of aortic valve stenosis. Peak PG of 12 and mean PG of 8 mm Hg. Mild to moderate mitral regurgitation. Mild tricuspid regurgitation. No evidence of pulmonary hypertension. Compared to the study done on 08/09/2017, EF appears to have slightly improved from prior 35-40%.  Moderate pulmonary hypertension no longer present.   EKG 09/22/2017: Sinus rhythm 79 bpm. First degree AV block. Incomplete RBBB. Occasional PVC  Cardiac surgery 07/24/2017: CABG (LIMA-LAD, SVG- dLCx, SVG-RCA) Aortic valve replacement 23 mm Edwards Inspiris Resilia pericardial valve LAA clipping  Vascular ultrasound 07/11/2017: Bilateral carotid mild stenosis 1-39% Normal bilateral ABI  Recent labs: 08/08/2019: Glucose 140, BUN/Cr 14/0.65. EGFR >60. Na/K 132/4.3. Rest of the CMP normal H/H 11/33. MCV 95. Platelets 141  08/2018: Chol 139, TG 48, HDL 63, LDL 66    Review of Systems  Cardiovascular: Negative for chest pain, dyspnea on exertion, leg swelling, palpitations and syncope.         Today's Vitals   09/19/19 1435  BP: 125/69  Pulse: 75  Resp: 17  SpO2: 96%  Weight: 180 lb (81.6 kg)  Height: '5\' 11"'  (1.803 m)   Body mass index is 25.1 kg/m.    Objective:    Physical Exam Vitals and nursing note reviewed.  Constitutional:      General: He is not in acute distress. Neck:     Vascular: No JVD.  Cardiovascular:     Rate and Rhythm: Normal rate and regular rhythm.     Heart sounds: Normal heart sounds. No murmur heard.   Pulmonary:     Effort: Pulmonary effort is normal.     Breath sounds: Normal breath sounds. No wheezing or rales.  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  Hypertension: Well controlled.  Hyperlipidemia: Well controlled. Continue lipitor 20 mg for now.  Repeat lipid panel in 02/2020  CAD: S/p CABG. No angina symptoms. Continue ASA, statin.  S/p bioprosthetic AVR: Stable.  EF has improved to 45-50% several weeks after CABG and AVR, with no symptoms of heart failure.  Repeat echocardiogram in 02/2020  F/u in 6 months  Nigel Mormon, MD [facility]. [place] Pager: [phone] Office: [phone] If no answer Cell [phone]

## 2019-09-22 ENCOUNTER — Other Ambulatory Visit: Payer: Self-pay | Admitting: Cardiology

## 2019-09-22 DIAGNOSIS — I10 Essential (primary) hypertension: Secondary | ICD-10-CM

## 2020-01-08 DIAGNOSIS — Z0000 Encounter for general adult medical examination without abnormal findings: Secondary | ICD-10-CM | POA: Diagnosis not present

## 2020-01-08 DIAGNOSIS — Z1389 Encounter for screening for other disorder: Secondary | ICD-10-CM | POA: Diagnosis not present

## 2020-02-06 DIAGNOSIS — Z96651 Presence of right artificial knee joint: Secondary | ICD-10-CM | POA: Diagnosis not present

## 2020-02-06 DIAGNOSIS — Z471 Aftercare following joint replacement surgery: Secondary | ICD-10-CM | POA: Diagnosis not present

## 2020-02-17 ENCOUNTER — Other Ambulatory Visit: Payer: Self-pay | Admitting: Cardiology

## 2020-02-17 DIAGNOSIS — I10 Essential (primary) hypertension: Secondary | ICD-10-CM

## 2020-02-17 DIAGNOSIS — Z952 Presence of prosthetic heart valve: Secondary | ICD-10-CM

## 2020-02-21 ENCOUNTER — Other Ambulatory Visit: Payer: Self-pay | Admitting: Cardiology

## 2020-02-21 ENCOUNTER — Other Ambulatory Visit (HOSPITAL_COMMUNITY): Payer: Self-pay | Admitting: Cardiology

## 2020-02-21 ENCOUNTER — Other Ambulatory Visit: Payer: Self-pay

## 2020-02-21 ENCOUNTER — Ambulatory Visit: Payer: Medicare PPO

## 2020-02-21 DIAGNOSIS — Z952 Presence of prosthetic heart valve: Secondary | ICD-10-CM

## 2020-02-21 DIAGNOSIS — E782 Mixed hyperlipidemia: Secondary | ICD-10-CM

## 2020-02-21 DIAGNOSIS — I10 Essential (primary) hypertension: Secondary | ICD-10-CM | POA: Diagnosis not present

## 2020-02-21 DIAGNOSIS — M545 Low back pain, unspecified: Secondary | ICD-10-CM | POA: Diagnosis not present

## 2020-02-22 LAB — LIPID PANEL WITH LDL/HDL RATIO
Cholesterol, Total: 147 mg/dL (ref 100–199)
HDL: 57 mg/dL (ref >39)
LDL Chol Calc (NIH): 78 mg/dL (ref 0–99)
LDL/HDL Ratio: 1.4 ratio (ref 0.0–3.6)
Triglycerides: 57 mg/dL (ref 0–149)
VLDL Cholesterol Cal: 12 mg/dL (ref 5–40)

## 2020-02-28 ENCOUNTER — Encounter: Payer: Self-pay | Admitting: Cardiology

## 2020-02-28 ENCOUNTER — Other Ambulatory Visit: Payer: Self-pay

## 2020-02-28 ENCOUNTER — Ambulatory Visit: Payer: Medicare PPO | Admitting: Cardiology

## 2020-02-28 VITALS — BP 142/71 | HR 66 | Temp 97.0°F | Resp 16 | Ht 71.0 in | Wt 183.0 lb

## 2020-02-28 DIAGNOSIS — I10 Essential (primary) hypertension: Secondary | ICD-10-CM | POA: Diagnosis not present

## 2020-02-28 DIAGNOSIS — I498 Other specified cardiac arrhythmias: Secondary | ICD-10-CM | POA: Diagnosis not present

## 2020-02-28 DIAGNOSIS — Z952 Presence of prosthetic heart valve: Secondary | ICD-10-CM

## 2020-02-28 DIAGNOSIS — I25810 Atherosclerosis of coronary artery bypass graft(s) without angina pectoris: Secondary | ICD-10-CM | POA: Diagnosis not present

## 2020-02-28 DIAGNOSIS — E782 Mixed hyperlipidemia: Secondary | ICD-10-CM

## 2020-02-28 NOTE — Progress Notes (Signed)
Subjective:   [name], male    DOB: [birth year], 80 y.o.   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  Patient admits his physical activity has been down due to orthopedic issues and weather. Blood pressures slightly elevated today, which he attributes to the above. He denies chest pain, shortness of breath, palpitations, leg edema, orthopnea, PND, TIA/syncope. Recent echocardiogram results reviewed with the patient, details below.    Current Outpatient Medications on File Prior to Visit  Medication Sig Dispense Refill  . acetaminophen (TYLENOL) 500 MG tablet Take 2 tablets (1,000 mg total) by mouth every 6 (six) hours as needed for mild pain or fever. 30 tablet 0  . allopurinol (ZYLOPRIM) 100 MG tablet Take 200 mg by mouth every morning.    Marland Kitchen amLODipine (NORVASC) 5 MG tablet TAKE 1.5 TABLETS (7.5 MG TOTAL) BY MOUTH DAILY. 135 tablet 3  . aspirin EC 81 MG tablet Take 81 mg by mouth daily. Swallow whole.    Marland Kitchen atorvastatin (LIPITOR) 20 MG tablet Take 1 tablet (20 mg total) by mouth daily at 6 PM. 90 tablet 3  . loratadine (CLARITIN) 10 MG tablet Take 10 mg by mouth daily.    Marland Kitchen losartan (COZAAR) 25 MG tablet TAKE 1 TABLET BY MOUTH EVERY DAY 90 tablet 1  . psyllium (METAMUCIL) 58.6 % powder Take 1 packet by mouth daily as needed (regularity (3-4 times a week)).     Marland Kitchen tamsulosin (FLOMAX) 0.4 MG CAPS capsule Take 0.4 mg by mouth every evening.      No current facility-administered medications on file prior to visit.    Cardiovascular studies:  EKG 02/28/2020: Sinus rhythm with atrial bigeminy Right bundle branch block Atrial bigeminy new since previous EKG  Echocardiogram 02/21/2020:  Left ventricle cavity is normal in size. Mild concentric  hypertrophy of  the left ventricle. Normal global wall motion. Normal LV systolic function  with EF 55%. Doppler evidence of grade II (pseudonormal) diastolic  dysfunction, elevated LAP.  Left atrial cavity is severely dilated.  Well seated bioprosthetic aortic valve. Mildly elevated mean PG 13 mmHg.  No aortic valve regurgitation.  Moderate (Grade II) mitral regurgitation.  Mild tricuspid regurgitation. Estimated pulmonary artery systolic pressure  34 mmHg.  Compared to previous study on 02/20/2018, LVEF is improved from 45-50%.  Diastolic dysfunction, mild PH are new. Mean PG increased from 8 mmHg to  13 mmHg.   Cardiac surgery 07/24/2017: CABG (LIMA-LAD, SVG- dLCx, SVG-RCA) Aortic valve replacement 23 mm Edwards Inspiris Resilia pericardial valve LAA clipping  Vascular ultrasound 07/11/2017: Bilateral carotid mild stenosis 1-39% Normal bilateral ABI  Recent labs: 08/08/2019: Glucose 140, BUN/Cr 14/0.65. EGFR >60. Na/K 132/4.3. Rest of the CMP normal H/H 11/33. MCV 95. Platelets 141  08/2018: Chol 139, TG 48, HDL 63, LDL 66    Review of Systems  Cardiovascular: Negative for chest pain, dyspnea on exertion, leg swelling, palpitations and syncope.         Today's Vitals   02/28/20 1432  BP: (!) 142/71  Pulse: 66  Resp: 16  Temp: (!) 97 F (36.1 C)  TempSrc: Temporal  SpO2: 97%  Weight: 183 lb (83 kg)  Height: '5\' 11"'  (1.803 m)   Body mass index is 25.52 kg/m.    Objective:  Physical Exam Vitals and nursing note reviewed.  Constitutional:      General: He is not in acute distress. Neck:     Vascular: No JVD.  Cardiovascular:     Rate and Rhythm: Normal rate and regular rhythm.     Heart sounds: Normal heart sounds. No murmur heard.   Pulmonary:     Effort: Pulmonary effort is normal.     Breath sounds: Normal breath sounds. No wheezing or rales.  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  Hypertension: BP elevated today. Recommend home monitoring. Increase physical activity,  as tolerated.  Atrial bigeminy: Asymptomatic. He wants to avoid beta blockers for now. Will repeat EKG at next visit.   Mixed hyperlipidemia: Well controlled. Continue lipitor 20 mg for now.   CAD without angina: S/p CABG. No angina symptoms. Continue ASA, statin.  S/p bioprosthetic AVR: Stable.  EF has improved. Diastolic dysfunction Grade II, mild TR, mod MR.  Continue hypertension management for now.  F/u in 6 weeks for hypertension management and repeat EKG  Nigel Mormon, MD [facility] [place] Pager: [phone] Office: [phone] If no answer Cell [phone]

## 2020-03-02 DIAGNOSIS — M5440 Lumbago with sciatica, unspecified side: Secondary | ICD-10-CM | POA: Diagnosis not present

## 2020-03-06 DIAGNOSIS — M5440 Lumbago with sciatica, unspecified side: Secondary | ICD-10-CM | POA: Diagnosis not present

## 2020-03-09 DIAGNOSIS — M5440 Lumbago with sciatica, unspecified side: Secondary | ICD-10-CM | POA: Diagnosis not present

## 2020-03-10 DIAGNOSIS — M5440 Lumbago with sciatica, unspecified side: Secondary | ICD-10-CM | POA: Diagnosis not present

## 2020-03-16 DIAGNOSIS — M5440 Lumbago with sciatica, unspecified side: Secondary | ICD-10-CM | POA: Diagnosis not present

## 2020-03-17 DIAGNOSIS — M5440 Lumbago with sciatica, unspecified side: Secondary | ICD-10-CM | POA: Diagnosis not present

## 2020-03-19 NOTE — Telephone Encounter (Signed)
BP well controlled. Okay to cancel 3/3 appt  Thanks MJP

## 2020-03-24 DIAGNOSIS — M5440 Lumbago with sciatica, unspecified side: Secondary | ICD-10-CM | POA: Diagnosis not present

## 2020-03-26 DIAGNOSIS — M5440 Lumbago with sciatica, unspecified side: Secondary | ICD-10-CM | POA: Diagnosis not present

## 2020-04-03 DIAGNOSIS — M5440 Lumbago with sciatica, unspecified side: Secondary | ICD-10-CM | POA: Diagnosis not present

## 2020-04-09 ENCOUNTER — Ambulatory Visit: Payer: Medicare PPO | Admitting: Cardiology

## 2020-05-15 DIAGNOSIS — Z951 Presence of aortocoronary bypass graft: Secondary | ICD-10-CM | POA: Diagnosis not present

## 2020-05-15 DIAGNOSIS — Z952 Presence of prosthetic heart valve: Secondary | ICD-10-CM | POA: Diagnosis not present

## 2020-05-15 DIAGNOSIS — K642 Third degree hemorrhoids: Secondary | ICD-10-CM | POA: Diagnosis not present

## 2020-05-15 DIAGNOSIS — I25810 Atherosclerosis of coronary artery bypass graft(s) without angina pectoris: Secondary | ICD-10-CM | POA: Diagnosis not present

## 2020-05-15 DIAGNOSIS — I350 Nonrheumatic aortic (valve) stenosis: Secondary | ICD-10-CM | POA: Diagnosis not present

## 2020-05-15 DIAGNOSIS — L723 Sebaceous cyst: Secondary | ICD-10-CM | POA: Diagnosis not present

## 2020-05-15 DIAGNOSIS — I10 Essential (primary) hypertension: Secondary | ICD-10-CM | POA: Diagnosis not present

## 2020-05-15 DIAGNOSIS — Z974 Presence of external hearing-aid: Secondary | ICD-10-CM | POA: Diagnosis not present

## 2020-05-15 DIAGNOSIS — M109 Gout, unspecified: Secondary | ICD-10-CM | POA: Diagnosis not present

## 2020-05-19 DIAGNOSIS — Z952 Presence of prosthetic heart valve: Secondary | ICD-10-CM | POA: Diagnosis not present

## 2020-05-19 DIAGNOSIS — I10 Essential (primary) hypertension: Secondary | ICD-10-CM | POA: Diagnosis not present

## 2020-05-19 DIAGNOSIS — K642 Third degree hemorrhoids: Secondary | ICD-10-CM | POA: Diagnosis not present

## 2020-05-19 DIAGNOSIS — I25810 Atherosclerosis of coronary artery bypass graft(s) without angina pectoris: Secondary | ICD-10-CM | POA: Diagnosis not present

## 2020-05-19 DIAGNOSIS — Z974 Presence of external hearing-aid: Secondary | ICD-10-CM | POA: Diagnosis not present

## 2020-05-19 DIAGNOSIS — I350 Nonrheumatic aortic (valve) stenosis: Secondary | ICD-10-CM | POA: Diagnosis not present

## 2020-05-19 DIAGNOSIS — M109 Gout, unspecified: Secondary | ICD-10-CM | POA: Diagnosis not present

## 2020-05-19 DIAGNOSIS — L723 Sebaceous cyst: Secondary | ICD-10-CM | POA: Diagnosis not present

## 2020-05-19 DIAGNOSIS — Z951 Presence of aortocoronary bypass graft: Secondary | ICD-10-CM | POA: Diagnosis not present

## 2020-06-24 DIAGNOSIS — Z20822 Contact with and (suspected) exposure to covid-19: Secondary | ICD-10-CM | POA: Diagnosis not present

## 2020-07-03 ENCOUNTER — Ambulatory Visit: Payer: Medicare PPO | Admitting: Cardiology

## 2020-07-16 DIAGNOSIS — H6123 Impacted cerumen, bilateral: Secondary | ICD-10-CM | POA: Diagnosis not present

## 2020-07-16 DIAGNOSIS — I10 Essential (primary) hypertension: Secondary | ICD-10-CM | POA: Diagnosis not present

## 2020-07-16 DIAGNOSIS — Z951 Presence of aortocoronary bypass graft: Secondary | ICD-10-CM | POA: Diagnosis not present

## 2020-07-16 DIAGNOSIS — K642 Third degree hemorrhoids: Secondary | ICD-10-CM | POA: Diagnosis not present

## 2020-07-16 DIAGNOSIS — Z974 Presence of external hearing-aid: Secondary | ICD-10-CM | POA: Diagnosis not present

## 2020-07-16 DIAGNOSIS — I25810 Atherosclerosis of coronary artery bypass graft(s) without angina pectoris: Secondary | ICD-10-CM | POA: Diagnosis not present

## 2020-07-16 DIAGNOSIS — I350 Nonrheumatic aortic (valve) stenosis: Secondary | ICD-10-CM | POA: Diagnosis not present

## 2020-07-16 DIAGNOSIS — Z952 Presence of prosthetic heart valve: Secondary | ICD-10-CM | POA: Diagnosis not present

## 2020-07-16 DIAGNOSIS — M109 Gout, unspecified: Secondary | ICD-10-CM | POA: Diagnosis not present

## 2020-07-29 DIAGNOSIS — K648 Other hemorrhoids: Secondary | ICD-10-CM | POA: Diagnosis not present

## 2020-09-01 DIAGNOSIS — L723 Sebaceous cyst: Secondary | ICD-10-CM | POA: Diagnosis not present

## 2020-09-01 DIAGNOSIS — I10 Essential (primary) hypertension: Secondary | ICD-10-CM | POA: Diagnosis not present

## 2020-09-01 DIAGNOSIS — Z951 Presence of aortocoronary bypass graft: Secondary | ICD-10-CM | POA: Diagnosis not present

## 2020-09-01 DIAGNOSIS — I350 Nonrheumatic aortic (valve) stenosis: Secondary | ICD-10-CM | POA: Diagnosis not present

## 2020-09-01 DIAGNOSIS — D692 Other nonthrombocytopenic purpura: Secondary | ICD-10-CM | POA: Diagnosis not present

## 2020-09-01 DIAGNOSIS — M109 Gout, unspecified: Secondary | ICD-10-CM | POA: Diagnosis not present

## 2020-09-01 DIAGNOSIS — Z974 Presence of external hearing-aid: Secondary | ICD-10-CM | POA: Diagnosis not present

## 2020-09-01 DIAGNOSIS — Z952 Presence of prosthetic heart valve: Secondary | ICD-10-CM | POA: Diagnosis not present

## 2020-09-01 DIAGNOSIS — I25810 Atherosclerosis of coronary artery bypass graft(s) without angina pectoris: Secondary | ICD-10-CM | POA: Diagnosis not present

## 2020-10-07 DIAGNOSIS — L708 Other acne: Secondary | ICD-10-CM | POA: Diagnosis not present

## 2020-10-07 DIAGNOSIS — L720 Epidermal cyst: Secondary | ICD-10-CM | POA: Diagnosis not present

## 2020-12-08 DIAGNOSIS — N454 Abscess of epididymis or testis: Secondary | ICD-10-CM | POA: Diagnosis not present

## 2021-01-13 DIAGNOSIS — Z1389 Encounter for screening for other disorder: Secondary | ICD-10-CM | POA: Diagnosis not present

## 2021-01-13 DIAGNOSIS — Z0000 Encounter for general adult medical examination without abnormal findings: Secondary | ICD-10-CM | POA: Diagnosis not present

## 2021-02-23 DIAGNOSIS — Z951 Presence of aortocoronary bypass graft: Secondary | ICD-10-CM | POA: Diagnosis not present

## 2021-02-23 DIAGNOSIS — I350 Nonrheumatic aortic (valve) stenosis: Secondary | ICD-10-CM | POA: Diagnosis not present

## 2021-02-23 DIAGNOSIS — Z974 Presence of external hearing-aid: Secondary | ICD-10-CM | POA: Diagnosis not present

## 2021-02-23 DIAGNOSIS — Z952 Presence of prosthetic heart valve: Secondary | ICD-10-CM | POA: Diagnosis not present

## 2021-02-23 DIAGNOSIS — I10 Essential (primary) hypertension: Secondary | ICD-10-CM | POA: Diagnosis not present

## 2021-02-23 DIAGNOSIS — R8290 Unspecified abnormal findings in urine: Secondary | ICD-10-CM | POA: Diagnosis not present

## 2021-02-23 DIAGNOSIS — M109 Gout, unspecified: Secondary | ICD-10-CM | POA: Diagnosis not present

## 2021-02-23 DIAGNOSIS — I25810 Atherosclerosis of coronary artery bypass graft(s) without angina pectoris: Secondary | ICD-10-CM | POA: Diagnosis not present

## 2021-02-26 DIAGNOSIS — H524 Presbyopia: Secondary | ICD-10-CM | POA: Diagnosis not present

## 2021-02-26 DIAGNOSIS — Z961 Presence of intraocular lens: Secondary | ICD-10-CM | POA: Diagnosis not present

## 2021-02-26 DIAGNOSIS — H52221 Regular astigmatism, right eye: Secondary | ICD-10-CM | POA: Diagnosis not present

## 2021-02-26 DIAGNOSIS — H31093 Other chorioretinal scars, bilateral: Secondary | ICD-10-CM | POA: Diagnosis not present

## 2021-02-26 DIAGNOSIS — H5211 Myopia, right eye: Secondary | ICD-10-CM | POA: Diagnosis not present

## 2021-02-26 DIAGNOSIS — H35373 Puckering of macula, bilateral: Secondary | ICD-10-CM | POA: Diagnosis not present

## 2021-03-01 ENCOUNTER — Encounter: Payer: Self-pay | Admitting: Cardiology

## 2021-03-01 ENCOUNTER — Ambulatory Visit: Payer: Medicare PPO | Admitting: Cardiology

## 2021-03-01 ENCOUNTER — Inpatient Hospital Stay: Payer: Medicare PPO

## 2021-03-01 ENCOUNTER — Other Ambulatory Visit: Payer: Self-pay

## 2021-03-01 VITALS — BP 130/66 | HR 53 | Temp 98.0°F | Resp 16 | Ht 71.0 in | Wt 185.0 lb

## 2021-03-01 DIAGNOSIS — I25810 Atherosclerosis of coronary artery bypass graft(s) without angina pectoris: Secondary | ICD-10-CM

## 2021-03-01 DIAGNOSIS — Z952 Presence of prosthetic heart valve: Secondary | ICD-10-CM | POA: Diagnosis not present

## 2021-03-01 DIAGNOSIS — I493 Ventricular premature depolarization: Secondary | ICD-10-CM

## 2021-03-01 DIAGNOSIS — I491 Atrial premature depolarization: Secondary | ICD-10-CM | POA: Diagnosis not present

## 2021-03-01 DIAGNOSIS — I10 Essential (primary) hypertension: Secondary | ICD-10-CM

## 2021-03-01 NOTE — Progress Notes (Signed)
Subjective:   [name], male    DOB: [birth year], 81 y.o.   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  Patient is doing well, denies any chest pain. However, he has noticed getting tired with more than usual physical exertion-while he denies any specific dyspnea.    Current Outpatient Medications on File Prior to Visit  Medication Sig Dispense Refill   acetaminophen (TYLENOL) 500 MG tablet Take 2 tablets (1,000 mg total) by mouth every 6 (six) hours as needed for mild pain or fever. 30 tablet 0   allopurinol (ZYLOPRIM) 100 MG tablet Take 200 mg by mouth every morning.     amLODipine (NORVASC) 5 MG tablet TAKE 1.5 TABLETS (7.5 MG TOTAL) BY MOUTH DAILY. 135 tablet 3   aspirin EC 81 MG tablet Take 81 mg by mouth daily. Swallow whole.     atorvastatin (LIPITOR) 20 MG tablet Take 1 tablet (20 mg total) by mouth daily at 6 PM. 90 tablet 3   loratadine (CLARITIN) 10 MG tablet Take 10 mg by mouth daily.     losartan (COZAAR) 25 MG tablet TAKE 1 TABLET BY MOUTH EVERY DAY 90 tablet 1   psyllium (METAMUCIL) 58.6 % powder Take 1 packet by mouth daily as needed (regularity (3-4 times a week)).      tamsulosin (FLOMAX) 0.4 MG CAPS capsule Take 0.4 mg by mouth every evening.      No current facility-administered medications on file prior to visit.    Cardiovascular studies:  EKG 03/01/2021: Sinus rhythm 68 bpm Occasional PAC Frequent PVCs  Echocardiogram 02/21/2020:  Left ventricle cavity is normal in size. Mild concentric hypertrophy of  the left ventricle. Normal global wall motion. Normal LV systolic function  with EF 55%. Doppler evidence of grade II (pseudonormal) diastolic  dysfunction, elevated LAP.  Left atrial cavity is severely dilated.  Well  seated bioprosthetic aortic valve. Mildly elevated mean PG 13 mmHg.  No aortic valve regurgitation.  Moderate (Grade II) mitral regurgitation.  Mild tricuspid regurgitation. Estimated pulmonary artery systolic pressure  34 mmHg.  Compared to previous study on 02/20/2018, LVEF is improved from 45-50%.  Diastolic dysfunction, mild PH are new. Mean PG increased from 8 mmHg to  13 mmHg.   Cardiac surgery 07/24/2017: CABG (LIMA-LAD, SVG- dLCx, SVG-RCA) Aortic valve replacement 23 mm Edwards Inspiris Resilia pericardial valve LAA clipping  Vascular ultrasound 07/11/2017: Bilateral carotid mild stenosis 1-39% Normal bilateral ABI  Recent labs: 02/23/2021: Glucose 93, BUN/Cr 11/0.8. EGFR 87. Chol 146, TG 76, HDL 58, LDL 73  08/08/2019: Glucose 140, BUN/Cr 14/0.65. EGFR >60. Na/K 132/4.3. Rest of the CMP normal H/H 11/33. MCV 95. Platelets 141  08/2018: Chol 139, TG 48, HDL 63, LDL 66    Review of Systems  Constitutional: Positive for malaise/fatigue.  Cardiovascular:  Negative for chest pain, dyspnea on exertion, leg swelling, palpitations and syncope.        Today's Vitals   03/01/21 1415  BP: 130/66  Pulse: (!) 53  Resp: 16  Temp: 98 F (36.7 C)  SpO2: 99%  Weight: 185 lb (83.9 kg)  Height: '5\' 11"'  (1.803 m)   Body mass index is 25.8 kg/m.    Objective:    Physical Exam Vitals and nursing note reviewed.  Constitutional:  General: He is not in acute distress. Neck:     Vascular: No JVD.  Cardiovascular:     Rate and Rhythm: Normal rate and regular rhythm.     Pulses: Normal pulses.     Heart sounds: Murmur heard.  Harsh midsystolic murmur is present with a grade of 1/6 at the upper right sternal border radiating to the neck.  Pulmonary:     Effort: Pulmonary effort is normal.     Breath sounds: Normal breath sounds. No wheezing or rales.  Musculoskeletal:     Right lower leg: No edema.     Left lower leg: No edema.     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  Hypertension: Controlled  Frequent PAC/PVC: Recent decreased exercise tolerance. Will check echocardiogram, 2 week cardiac telemetry.  Mixed hyperlipidemia: Well controlled. Continue lipitor 20 mg for now.   CAD without angina: S/p CABG. No angina symptoms. Continue ASA, statin.   S/p bioprosthetic AVR: Stable.Will check repeat echocardiogram.  F/u after above testing  Nigel Mormon, MD [facility] [place] Pager: [phone] Office: [phone] If no answer Cell [phone]

## 2021-03-16 ENCOUNTER — Ambulatory Visit: Payer: Medicare PPO

## 2021-03-16 ENCOUNTER — Other Ambulatory Visit: Payer: Self-pay

## 2021-03-16 DIAGNOSIS — I493 Ventricular premature depolarization: Secondary | ICD-10-CM

## 2021-03-16 DIAGNOSIS — I10 Essential (primary) hypertension: Secondary | ICD-10-CM

## 2021-03-22 DIAGNOSIS — I493 Ventricular premature depolarization: Secondary | ICD-10-CM | POA: Diagnosis not present

## 2021-03-22 NOTE — Progress Notes (Signed)
Subjective:   [name], male    DOB: Jul 22, 1940, 81 y.o.   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  Reviewed recent test results with the patient, details below. He feels tired with climbing up the hill, but symptoms are nowhere near what they were before his CABG and AVR in 2019    Current Outpatient Medications on File Prior to Visit  Medication Sig Dispense Refill   acetaminophen (TYLENOL) 500 MG tablet Take 2 tablets (1,000 mg total) by mouth every 6 (six) hours as needed for mild pain or fever. 30 tablet 0   allopurinol (ZYLOPRIM) 100 MG tablet Take 200 mg by mouth every morning.     amLODipine (NORVASC) 5 MG tablet TAKE 1.5 TABLETS (7.5 MG TOTAL) BY MOUTH DAILY. 135 tablet 3   aspirin EC 81 MG tablet Take 81 mg by mouth daily. Swallow whole.     atorvastatin (LIPITOR) 20 MG tablet Take 1 tablet (20 mg total) by mouth daily at 6 PM. 90 tablet 3   loratadine (CLARITIN) 10 MG tablet Take 10 mg by mouth daily.     losartan (COZAAR) 25 MG tablet TAKE 1 TABLET BY MOUTH EVERY DAY 90 tablet 1   psyllium (METAMUCIL) 58.6 % powder Take 1 packet by mouth daily as needed (regularity (3-4 times a week)).      tamsulosin (FLOMAX) 0.4 MG CAPS capsule Take 0.4 mg by mouth every evening.      No current facility-administered medications on file prior to visit.    Cardiovascular studies:  EKG 03/31/2021: Sinus rhythm with sinus arrhtymia Right bundle branch block  Mobile cardiac telemetry 13 days 03/01/2021 - 03/15/2021: Dominant rhythm: Sinus. HR 42-125 bpm. Avg HR 71 bpm, in sinus rhythm. 15 episodes of SVT/probable AT, fastest at 148 bpm for 6 beats, longest for 19 beats at 96 bpm. 7.4% isolated SVE, <1% couplet/triplets. 4 episodes of VT, fastest  at 144 bpm for 4 bpm, longest for 12 beats at 102 bpm. Some episodes of VT may be SVT with possible aberrancy. 8.3% isolated VE, <1% couplet/triplets. 1 episode of sinus pause 3.1 sec at 12:11 AM w/no reported symptoms.  No atrial fibrillation/atrial flutter/VT/high grade AV block, noted. 1 patient triggered event, correlated w/VE.  Echocardiogram 03/16/2021: Left ventricle cavity is normal in size and wall thickness. Abnormal septal wall motion due to post-operative valve. Normal LV systolic function with visual EF 50-55%. Doppler evidence of grade II (pseudonormal) diastolic dysfunction, elevated LAP.  Left atrial cavity is moderately dilated. Well seated bioprosthetic aortic valve. Mildly elevated mean PG 13 mmHg. No aortic valve regurgitation. Mild mitral valve leaflet thickening. Moderate (Grade II) mitral regurgitation. Mild tricuspid regurgitation.  Mild pulmonic regurgitation. No evidence of pulmonary hypertension. No significant change compared to previous study on 02/21/2020.  Cardiac surgery 07/24/2017: CABG (LIMA-LAD, SVG- dLCx, SVG-RCA) Aortic valve replacement 23 mm Edwards Inspiris Resilia pericardial valve LAA clipping  Vascular ultrasound 07/11/2017: Bilateral carotid mild stenosis 1-39% Normal bilateral ABI  Recent labs: 02/23/2021: Glucose 93, BUN/Cr 11/0.8. EGFR 87. Chol 146, TG 76, HDL 58, LDL 73  08/08/2019: Glucose 140, BUN/Cr 14/0.65. EGFR >60. Na/K 132/4.3. Rest of the CMP normal H/H 11/33. MCV 95. Platelets 141  08/2018: Chol 139, TG 48, HDL 63, LDL 66  Review of Systems  Constitutional: Positive for malaise/fatigue.  Cardiovascular:  Negative for chest pain, dyspnea on exertion, leg swelling, palpitations and syncope.        Today's Vitals   03/31/21 1425 03/31/21 1428  BP: (!) 143/61 132/67  Pulse: 63 63  Temp: 98.1 F (36.7 C)   TempSrc: Bladder   SpO2: 97% 96%  Weight: 180 lb (81.6 kg)   Height: _0  (1.803 m)    Body mass index is  25.1 kg/m.    Objective:    Physical Exam Vitals and nursing note reviewed.  Constitutional:      General: He is not in acute distress. Neck:     Vascular: No JVD.  Cardiovascular:     Rate and Rhythm: Normal rate and regular rhythm.     Pulses: Normal pulses.     Heart sounds: Murmur heard.  Harsh midsystolic murmur is present with a grade of 1/6 at the upper right sternal border radiating to the neck.  Pulmonary:     Effort: Pulmonary effort is normal.     Breath sounds: Normal breath sounds. No wheezing or rales.  Musculoskeletal:     Right lower leg: No edema.     Left lower leg: No edema.     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  Exertional dyspnea: Mild. No significant changes on echocardiogram. Okay to hold of ischemia testing for now.  Hypertension: Controlled  Frequent PAC/PVC: Noted on EKG and monitor No Afib He has not wanted to be on beta blocker in the past. With resting HR in 60s, okay to hold off.  Mixed hyperlipidemia: Well controlled. Continue lipitor 20 mg for now.   CAD without angina: S/p CABG. No angina symptoms. Continue ASA, statin.   S/p bioprosthetic AVR: Stable.  F/u in 6 months  Aliveah Gallant Esther Hardy, MD [facility] [place] Pager: [phone] Office: [phone] If no answer Cell [phone]

## 2021-03-26 DIAGNOSIS — I493 Ventricular premature depolarization: Secondary | ICD-10-CM | POA: Diagnosis not present

## 2021-03-31 ENCOUNTER — Encounter: Payer: Self-pay | Admitting: Cardiology

## 2021-03-31 ENCOUNTER — Ambulatory Visit: Payer: Medicare PPO | Admitting: Cardiology

## 2021-03-31 ENCOUNTER — Other Ambulatory Visit: Payer: Self-pay

## 2021-03-31 VITALS — BP 132/67 | HR 63 | Temp 98.1°F | Ht 71.0 in | Wt 180.0 lb

## 2021-03-31 DIAGNOSIS — I25810 Atherosclerosis of coronary artery bypass graft(s) without angina pectoris: Secondary | ICD-10-CM

## 2021-03-31 DIAGNOSIS — I10 Essential (primary) hypertension: Secondary | ICD-10-CM | POA: Diagnosis not present

## 2021-03-31 DIAGNOSIS — I493 Ventricular premature depolarization: Secondary | ICD-10-CM

## 2021-07-29 DIAGNOSIS — M545 Low back pain, unspecified: Secondary | ICD-10-CM | POA: Diagnosis not present

## 2021-09-17 ENCOUNTER — Ambulatory Visit: Payer: Medicare Other | Admitting: Family

## 2021-09-20 ENCOUNTER — Ambulatory Visit: Payer: Medicare PPO | Admitting: Family

## 2021-09-20 ENCOUNTER — Encounter: Payer: Self-pay | Admitting: Family

## 2021-09-20 VITALS — BP 122/74 | HR 57 | Temp 96.9°F | Resp 20 | Ht 71.0 in | Wt 172.2 lb

## 2021-09-20 DIAGNOSIS — N400 Enlarged prostate without lower urinary tract symptoms: Secondary | ICD-10-CM

## 2021-09-20 DIAGNOSIS — E782 Mixed hyperlipidemia: Secondary | ICD-10-CM | POA: Diagnosis not present

## 2021-09-20 DIAGNOSIS — Z7689 Persons encountering health services in other specified circumstances: Secondary | ICD-10-CM | POA: Diagnosis not present

## 2021-09-20 DIAGNOSIS — K219 Gastro-esophageal reflux disease without esophagitis: Secondary | ICD-10-CM | POA: Diagnosis not present

## 2021-09-20 DIAGNOSIS — Z789 Other specified health status: Secondary | ICD-10-CM

## 2021-09-20 DIAGNOSIS — I25810 Atherosclerosis of coronary artery bypass graft(s) without angina pectoris: Secondary | ICD-10-CM | POA: Diagnosis not present

## 2021-09-20 DIAGNOSIS — I10 Essential (primary) hypertension: Secondary | ICD-10-CM

## 2021-09-20 DIAGNOSIS — M1000 Idiopathic gout, unspecified site: Secondary | ICD-10-CM

## 2021-09-20 NOTE — Progress Notes (Signed)
Provider: Marlowe Sax FNP-C   Coletta Lockner, Nelda Bucks, NP  Patient Care Team: Quasean Frye, Nelda Bucks, NP as PCP - General (Family Medicine) Cardiovascular, [facility]  Extended Emergency Contact Information Primary Emergency Contact: [place], [place] Home Phone: [phone] Mobile Phone: [phone] Relation: Son Secondary Emergency Contact: Clydene Fake Address: [address]          [place] [address] Johnnette Litter of [place] Phone: [phone] Relation: Spouse  Code Status:Full Code  Goals of care: Advanced Directive information    09/20/2021    9:45 AM  Advanced Directives  Does Patient Have a Medical Advance Directive? Yes  Type of Paramedic of [place];Living will  Does patient want to make changes to medical advance directive? No - Patient declined  Copy of [place] in Chart? Yes - validated most recent copy scanned in chart (See row information)     Chief Complaint  Patient presents with   New Patient (Initial Visit)    Patient presents today for a new patient appointment.     HPI:  Pt is a 81 y.o. 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  States no recent visit to PCP or fasting lab work.  Hypertension -no home readings for review but states blood pressure has been well controlled on amlodipine. Has changed his diet and does exercise which has helped to maintain a low blood pressure.denies any headache,dizziness,vision changes,fatigue,chest tightness,palpitation,chest pain or shortness of breath.     GERD -change of diet has helped.  Currently not on any medication.  Constipation -takes Metamucil as  needed but with these change of diet which includes veggies has helped with her constipation.  Has not even had to use hemorrhoid medications.  Gout -on allopurinol daily.  No recent flareup of gout for several years.  BPH - has had increased urinary frequency.He denies any fever,chills,nausea,vomiting,abdominal pain,flank pain,urgency,dysuria,difficult urination or hematuria.On tamsulosin wonders whether need to increase dosage.  Discussed referral to urologist for evaluation.  We will also check urine specimen for signs of infection.  CAD -denies any chest pain or shortness of breath.  Takes aspirin  and atorvastatin every day.  Has also changed his diet stated does intermittent fasting and eats only 2 meals.  Also does exercise every day.  Reports no recent fall episode or weight changes.   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    HFrEF (heart failure with reduced ejection fraction) (HCC)    Hypertension    Moderate mitral regurgitation    Severe aortic stenosis    Past Surgical History:  Procedure Laterality Date   AORTIC VALVE REPLACEMENT N/A 07/24/2017   Procedure: AORTIC VALVE REPLACEMENT (AVR) using 66m Inspiris Aortic Valve;  Surgeon: BGaye Pollack MD;  Location: [facility] OR;  Service: Open Heart Surgery;  Laterality: N/A;   CLIPPING OF ATRIAL APPENDAGE N/A 07/24/2017   Procedure: CLIPPING OF ATRIAL APPENDAGE using a 559mAtricure clip;  Surgeon: BaGaye PollackMD;  [facility] Service: Open Heart Surgery;  Laterality: N/A;  CORONARY ARTERY BYPASS GRAFT N/A 07/24/2017   Procedure: CORONARY ARTERY BYPASS GRAFTING (CABG) times 3 using the left greater saphenous vein harvested endoscopically and left internal mammary artery.;  Surgeon: Gaye Pollack, MD;  Location: [facility] OR;  Service: Open Heart Surgery;  Laterality: N/A;   EYE SURGERY  Bilateral    lens replacements for cataracts   HERNIA REPAIR     INGUINAL HERNIA REPAIR Right 05/06/2015   Procedure: LAPAROSCOPIC RIGHT INGUINAL HERNIA WITH MESH;  Surgeon: Coralie Keens, MD;  Location: [facility]S;  Service: General;  Laterality: Right;   INSERTION OF MESH Right 05/06/2015   Procedure: INSERTION OF MESH;  Surgeon: Coralie Keens, MD;  Location: [facility];  Service: General;  Laterality: Right;   RIGHT/LEFT HEART CATH AND CORONARY ANGIOGRAPHY N/A 07/10/2017   Procedure: RIGHT/LEFT HEART CATH AND CORONARY ANGIOGRAPHY;  Surgeon: Nigel Mormon, MD;  Location: [facility];  Service: Cardiovascular;  Laterality: N/A;   TEE WITHOUT CARDIOVERSION N/A 07/11/2017   Procedure: TRANSESOPHAGEAL ECHOCARDIOGRAM (TEE);  Surgeon: Nigel Mormon, MD;  Location: [facility]COPY;  Service: Cardiovascular;  Laterality: N/A;   TEE WITHOUT CARDIOVERSION N/A 07/24/2017   Procedure: TRANSESOPHAGEAL ECHOCARDIOGRAM (TEE);  Surgeon: Gaye Pollack, MD;  Location: [facility];  Service: Open Heart Surgery;  Laterality: N/A;   TOTAL KNEE ARTHROPLASTY Right 08/07/2019   Procedure: TOTAL KNEE ARTHROPLASTY;  Surgeon: Gaynelle Arabian, MD;  Location: [facility];  Service: Orthopedics;  Laterality: Right;   ULTRASOUND GUIDANCE FOR VASCULAR ACCESS  07/10/2017   Procedure: Ultrasound Guidance For Vascular Access;  Surgeon: Nigel Mormon, MD;  Location: [facility];  Service: Cardiovascular;;    Allergies  Allergen Reactions   Sulfamethoxazole-Trimethoprim Rash    Allergies as of 09/20/2021       Reactions   Sulfamethoxazole-trimethoprim Rash        Medication List        Accurate as of September 20, 2021  9:53 AM. If you have any questions, ask your nurse or doctor.          STOP taking these medications    losartan 25 MG tablet Commonly known as: COZAAR Stopped by: Sandrea Hughs, NP       TAKE these medications    acetaminophen 500 MG tablet Commonly known as: TYLENOL Take  2 tablets (1,000 mg total) by mouth every 6 (six) hours as needed for mild pain or fever.   allopurinol 100 MG tablet Commonly known as: ZYLOPRIM Take 200 mg by mouth every morning.   amLODipine 5 MG tablet Commonly known as: NORVASC Take 5 mg by mouth daily.   aspirin EC 81 MG tablet Take 81 mg by mouth daily. Swallow whole.   atorvastatin 20 MG tablet Commonly known as: LIPITOR Take 1 tablet (20 mg total) by mouth daily at 6 PM.   loratadine 10 MG tablet Commonly known as: CLARITIN Take 10 mg by mouth daily.   PREPARATION H RE Place rectally as needed.   psyllium 58.6 % powder Commonly known as: METAMUCIL Take 1 packet by mouth daily as needed (regularity (3-4 times a week)).   tamsulosin 0.4 MG Caps capsule Commonly known as: FLOMAX Take 0.4 mg by mouth every evening.   terbinafine 250 MG tablet Commonly known as: LAMISIL Take 250 mg by mouth daily. What changed: Another medication with the same name was removed. Continue taking this medication, and follow the directions you see here. Changed by: Sandrea Hughs, NP  Review of Systems  Constitutional:  Negative for appetite change, chills, fatigue, fever and unexpected weight change.  HENT:  Negative for congestion, dental problem, ear discharge, ear pain, facial swelling, hearing loss, nosebleeds, postnasal drip, rhinorrhea, sinus pressure, sinus pain, sneezing, sore throat, tinnitus and trouble swallowing.   Eyes:  Negative for pain, discharge, redness, itching and visual disturbance.  Respiratory:  Negative for cough, chest tightness, shortness of breath and wheezing.   Cardiovascular:  Negative for chest pain, palpitations and leg swelling.  Gastrointestinal:  Negative for abdominal distention, abdominal pain, blood in stool, constipation, diarrhea, nausea and vomiting.  Endocrine: Negative for cold intolerance, heat intolerance, polydipsia, polyphagia and polyuria.  Genitourinary:  Negative for  difficulty urinating, dysuria, flank pain, frequency and urgency.  Musculoskeletal:  Negative for arthralgias, back pain, gait problem, joint swelling, myalgias, neck pain and neck stiffness.  Skin:  Negative for color change, pallor, rash and wound.  Neurological:  Negative for dizziness, syncope, speech difficulty, weakness, light-headedness, numbness and headaches.  Hematological:  Does not bruise/bleed easily.  Psychiatric/Behavioral:  Negative for agitation, behavioral problems, confusion, hallucinations, self-injury, sleep disturbance and suicidal ideas. The patient is not nervous/anxious.     Immunization History  Administered Date(s) Administered   Tdap 02/11/2013, 01/18/2019   Pertinent  Health Maintenance Due  Topic Date Due   INFLUENZA VACCINE  09/07/2021      02/10/2015   10:35 AM 01/18/2019   12:14 PM 08/07/2019   10:52 AM 08/07/2019    2:00 PM 08/07/2019    8:00 PM  [place] in the past year? No      Patient Fall Risk Level  Low fall risk Moderate fall risk High fall risk High fall risk   Functional Status Survey:    Vitals:   09/20/21 0946  BP: 122/74  Pulse: (!) 57  Resp: 20  Temp: (!) 96.9 F (36.1 C)  SpO2: 98%  Weight: 172 lb 3.2 oz (78.1 kg)  Height: _0  (1.803 m)   Body mass index is 24.02 kg/m. Physical Exam Vitals reviewed.  Constitutional:      General: He is not in acute distress.    Appearance: Normal appearance. He is normal weight. He is not ill-appearing or diaphoretic.  HENT:     Head: Normocephalic.     Right Ear: Tympanic membrane, ear canal and external ear normal. There is no impacted cerumen.     Left Ear: Tympanic membrane, ear canal and external ear normal. There is no impacted cerumen.     Nose: Nose normal. No congestion or rhinorrhea.     Mouth/Throat:     Mouth: Mucous membranes are moist.     Pharynx: Oropharynx is clear. No oropharyngeal exudate or posterior oropharyngeal erythema.  Eyes:     General: No scleral  icterus.       Right eye: No discharge.        Left eye: No discharge.     Extraocular Movements: Extraocular movements intact.     Conjunctiva/sclera: Conjunctivae normal.     Pupils: Pupils are equal, round, and reactive to light.  Neck:     Vascular: No carotid bruit.  Cardiovascular:     Rate and Rhythm: Normal rate and regular rhythm.     Pulses: Normal pulses.     Heart sounds: Normal heart sounds. No murmur heard.    No friction rub. No gallop.  Pulmonary:     Effort: Pulmonary effort is normal. No respiratory distress.  Breath sounds: Normal breath sounds. No wheezing, rhonchi or rales.  Chest:     Chest wall: No tenderness.  Abdominal:     General: Bowel sounds are normal. There is no distension.     Palpations: Abdomen is soft. There is no mass.     Tenderness: There is no abdominal tenderness. There is no right CVA tenderness, left CVA tenderness, guarding or rebound.  Musculoskeletal:        General: No swelling or tenderness. Normal range of motion.     Cervical back: Normal range of motion. No rigidity or tenderness.     Right lower leg: No edema.     Left lower leg: No edema.  Lymphadenopathy:     Cervical: No cervical adenopathy.  Skin:    General: Skin is warm and dry.     Coloration: Skin is not pale.     Findings: No bruising, erythema, lesion or rash.  Neurological:     Mental Status: He is alert and oriented to person, place, and time.     Cranial Nerves: No cranial nerve deficit.     Sensory: No sensory deficit.     Motor: No weakness.     Coordination: Coordination normal.     Gait: Gait normal.  Psychiatric:        Mood and Affect: Mood normal.        Speech: Speech normal.        Behavior: Behavior normal.        Thought Content: Thought content normal.        Judgment: Judgment normal.    Labs reviewed: No results for input(s): "NA", "K", "CL", "CO2", "GLUCOSE", "BUN", "CREATININE", "CALCIUM", "MG", "PHOS" in the last 8760 hours. No  results for input(s): "AST", "ALT", "ALKPHOS", "BILITOT", "PROT", "ALBUMIN" in the last 8760 hours. No results for input(s): "WBC", "NEUTROABS", "HGB", "HCT", "MCV", "PLT" in the last 8760 hours. No results found for: "TSH" Lab Results  Component Value Date   HGBA1C 5.7 (H) 07/20/2017   Lab Results  Component Value Date   CHOL 147 02/21/2020   HDL 57 02/21/2020   LDLCALC 78 02/21/2020   TRIG 57 02/21/2020   CHOLHDL 2.2 09/05/2018    Significant Diagnostic Results in last 30 days:  No results found.  Assessment/Plan  1. Encounter to establish care No latest medical records for review has signed release of medical records from previous PCP then will update records.  Recommended scheduling for fasting blood work.  Will obtain previous immunization record and will update.    2. Mixed hyperlipidemia No recent LDL for review -Encouraged to continue with dietary modification and exercise - Lipid panel  3. Coronary artery disease involving coronary bypass graft of native heart without angina pectoris Chest pain-free -Continue to control high risk factors -Continue on aspirin and atorvastatin  4. Gastroesophageal reflux disease without esophagitis As symptomatic Currently off medication  5. Essential hypertension Blood pressure well controlled -Continue on amlodipine - on ASA and Statin for Cardiovascular protection - TSH - COMPLETE METABOLIC PANEL WITH GFR - CBC with Differential/Platelet  6. Idiopathic gout, unspecified chronicity, unspecified site Symptoms controlled no recent flareup Continue on allopurinol  7. Benign prostatic hyperplasia without lower urinary tract symptoms Has had increased urinary frequency.  No other symptoms of urinary tract infection will obtain urine to rule out infection then referred to urologist for further evaluation - Ambulatory referral to Urology  8. Full code status Brought in MOST form - Full code  Family/  staff Communication:  Reviewed plan of care with patient verbalized understanding.  Labs/tests ordered:  - CBC with Differential/Platelet - CMP with eGFR([facility]) - TSH - Lipid panel  Next Appointment : Return in about 6 months (around 03/23/2022) for medical mangement of chronic issues.Sandrea Hughs, NP

## 2021-09-21 LAB — COMPLETE METABOLIC PANEL WITH GFR
AG Ratio: 1.8 (calc) (ref 1.0–2.5)
ALT: 11 U/L (ref 9–46)
AST: 14 U/L (ref 10–35)
Albumin: 4.4 g/dL (ref 3.6–5.1)
Alkaline phosphatase (APISO): 60 U/L (ref 35–144)
BUN: 13 mg/dL (ref 7–25)
CO2: 26 mmol/L (ref 20–32)
Calcium: 9.7 mg/dL (ref 8.6–10.3)
Chloride: 103 mmol/L (ref 98–110)
Creat: 1 mg/dL (ref 0.70–1.22)
Globulin: 2.5 g/dL (calc) (ref 1.9–3.7)
Glucose, Bld: 96 mg/dL (ref 65–99)
Potassium: 5.3 mmol/L (ref 3.5–5.3)
Sodium: 138 mmol/L (ref 135–146)
Total Bilirubin: 0.6 mg/dL (ref 0.2–1.2)
Total Protein: 6.9 g/dL (ref 6.1–8.1)
eGFR: 76 mL/min/{1.73_m2} (ref >=60)

## 2021-09-21 LAB — CBC WITH DIFFERENTIAL/PLATELET
Absolute Monocytes: 403 cells/uL (ref 200–950)
Basophils Absolute: 29 cells/uL (ref 0–200)
Basophils Relative: 0.8 %
Eosinophils Absolute: 90 cells/uL (ref 15–500)
Eosinophils Relative: 2.5 %
HCT: 37.2 % — ABNORMAL LOW (ref 38.5–50.0)
Hemoglobin: 12.8 g/dL — ABNORMAL LOW (ref 13.2–17.1)
Lymphs Abs: 612 cells/uL — ABNORMAL LOW (ref 850–3900)
MCH: 33.2 pg — ABNORMAL HIGH (ref 27.0–33.0)
MCHC: 34.4 g/dL (ref 32.0–36.0)
MCV: 96.4 fL (ref 80.0–100.0)
MPV: 10.2 fL (ref 7.5–12.5)
Monocytes Relative: 11.2 %
Neutro Abs: 2466 cells/uL (ref 1500–7800)
Neutrophils Relative %: 68.5 %
Platelets: 160 10*3/uL (ref 140–400)
RBC: 3.86 10*6/uL — ABNORMAL LOW (ref 4.20–5.80)
RDW: 11.8 % (ref 11.0–15.0)
Total Lymphocyte: 17 %
WBC: 3.6 10*3/uL — ABNORMAL LOW (ref 3.8–10.8)

## 2021-09-21 LAB — LIPID PANEL
Cholesterol: 162 mg/dL (ref <200)
HDL: 61 mg/dL (ref >=40)
LDL Cholesterol (Calc): 83 mg/dL (calc)
Non-HDL Cholesterol (Calc): 101 mg/dL (calc) (ref <130)
Total CHOL/HDL Ratio: 2.7 (calc) (ref <5.0)
Triglycerides: 88 mg/dL (ref <150)

## 2021-09-21 LAB — TSH: TSH: 1.63 mIU/L (ref 0.40–4.50)

## 2021-09-28 ENCOUNTER — Other Ambulatory Visit: Payer: Self-pay | Admitting: *Deleted

## 2021-09-28 ENCOUNTER — Encounter: Payer: Self-pay | Admitting: *Deleted

## 2021-09-28 DIAGNOSIS — D649 Anemia, unspecified: Secondary | ICD-10-CM

## 2021-09-29 ENCOUNTER — Ambulatory Visit: Payer: Medicare PPO | Admitting: Cardiology

## 2021-10-05 LAB — FECAL GLOBIN BY IMMUNOCHEMISTRY
FECAL GLOBIN RESULT:: NOT DETECTED
MICRO NUMBER:: 13840372
SPECIMEN QUALITY:: ADEQUATE

## 2021-10-15 ENCOUNTER — Encounter: Payer: Self-pay | Admitting: Cardiology

## 2021-10-15 ENCOUNTER — Ambulatory Visit: Payer: Medicare PPO | Admitting: Cardiology

## 2021-10-15 VITALS — BP 143/79 | HR 60 | Temp 98.0°F | Resp 17 | Ht 71.0 in | Wt 170.0 lb

## 2021-10-15 DIAGNOSIS — I10 Essential (primary) hypertension: Secondary | ICD-10-CM | POA: Diagnosis not present

## 2021-10-15 DIAGNOSIS — I25810 Atherosclerosis of coronary artery bypass graft(s) without angina pectoris: Secondary | ICD-10-CM

## 2021-10-15 DIAGNOSIS — I493 Ventricular premature depolarization: Secondary | ICD-10-CM

## 2021-10-15 NOTE — Progress Notes (Signed)
Subjective:   [name], male    DOB: 1940/11/28, 81 y.o.   MRN: [id number]   Chief complaint:  S/p AVR   HPI  81 year old Caucasian male with coronary artery disease and severe AS, treated with CABG X 3 (LIMA-LAD, SVG-dLCx, SVG-RCA), bioprosthetic AVR (23 mm Edwards Inspiris Resilia pericardial valve), LAA clipping in 08/2017.  Patient is doing well, denies chest pain, shortness of breath, palpitations, leg edema, orthopnea, PND, TIA/syncope. Blood pressure elevated today, SBP in 120s at home.      Current Outpatient Medications:    acetaminophen (TYLENOL) 500 MG tablet, Take 2 tablets (1,000 mg total) by mouth every 6 (six) hours as needed for mild pain or fever., Disp: 30 tablet, Rfl: 0   allopurinol (ZYLOPRIM) 100 MG tablet, Take 100 mg by mouth every morning., Disp: , Rfl:    amLODipine (NORVASC) 5 MG tablet, Take 5 mg by mouth daily., Disp: , Rfl:    aspirin EC 81 MG tablet, Take 81 mg by mouth daily. Swallow whole., Disp: , Rfl:    atorvastatin (LIPITOR) 20 MG tablet, Take 1 tablet (20 mg total) by mouth daily at 6 PM., Disp: 90 tablet, Rfl: 3   loratadine (CLARITIN) 10 MG tablet, Take 10 mg by mouth daily., Disp: , Rfl:    Multiple Vitamin (MULTIVITAMIN ADULT PO), Take 1 tablet by mouth daily., Disp: , Rfl:    Phenylephrine HCl (PREPARATION H RE), Place rectally as needed., Disp: , Rfl:    psyllium (METAMUCIL) 58.6 % powder, Take 1 packet by mouth daily as needed (regularity (3-4 times a week)). , Disp: , Rfl:    tamsulosin (FLOMAX) 0.4 MG CAPS capsule, Take 0.4 mg by mouth every evening. , Disp: , Rfl:    terbinafine (LAMISIL) 250 MG tablet, Take 250 mg by mouth daily., Disp: , Rfl:    Cardiovascular studies:  EKG 10/15/2021: Sinus rhythm 62 bpm  Right bundle branch block  Mobile cardiac telemetry 13 days 03/01/2021 - 03/15/2021: Dominant rhythm: Sinus. HR 42-125 bpm. Avg HR 71 bpm, in sinus rhythm. 15 episodes of SVT/probable AT, fastest at 148 bpm for 6  beats, longest for 19 beats at 96 bpm. 7.4% isolated SVE, <1% couplet/triplets. 4 episodes of VT, fastest at 144 bpm for 4 bpm, longest for 12 beats at 102 bpm. Some episodes of VT may be SVT with possible aberrancy. 8.3% isolated VE, <1% couplet/triplets. 1 episode of sinus pause 3.1 sec at 12:11 AM w/no reported symptoms.  No atrial fibrillation/atrial flutter/VT/high grade AV block, noted. 1 patient triggered event, correlated w/VE.  Echocardiogram 03/16/2021: Left ventricle cavity is normal in size and wall thickness. Abnormal septal wall motion due to post-operative valve. Normal LV systolic function with visual EF 50-55%. Doppler evidence of grade II (pseudonormal) diastolic dysfunction, elevated LAP.  Left atrial cavity is moderately dilated. Well seated bioprosthetic aortic valve. Mildly elevated mean PG 13 mmHg. No aortic valve regurgitation. Mild mitral valve leaflet thickening. Moderate (Grade II) mitral regurgitation. Mild tricuspid regurgitation.  Mild pulmonic regurgitation. No evidence of pulmonary hypertension. No significant change compared to previous study on 02/21/2020.  Cardiac surgery 07/24/2017: CABG (LIMA-LAD, SVG- dLCx, SVG-RCA) Aortic valve replacement 23 mm Edwards Inspiris Resilia pericardial valve LAA clipping  Vascular ultrasound 07/11/2017: Bilateral carotid mild stenosis 1-39% Normal bilateral ABI  Recent labs: 02/23/2021: Glucose 93, BUN/Cr 11/0.8. EGFR 87. Chol 146, TG 76, HDL 58, LDL 73  08/08/2019: Glucose 140, BUN/Cr 14/0.65. EGFR >60. Na/K 132/4.3. Rest of the CMP normal H/H  11/33. MCV 95. Platelets 141  08/2018: Chol 139, TG 48, HDL 63, LDL 66    Review of Systems  Constitutional: Negative for malaise/fatigue.  Cardiovascular:  Negative for chest pain, dyspnea on exertion, leg swelling, palpitations and syncope.         Today's Vitals   10/15/21 1149 10/15/21 1156  BP: (!) 155/84 (!) 143/79  Pulse: (!) 59 60  Resp: 17   Temp: 98 F  (36.7 C)   SpO2: 98%   Weight: 170 lb (77.1 kg)   Height: '5\' 11"'  (1.803 m)    Body mass index is 23.71 kg/m.    Objective:    Physical Exam Vitals and nursing note reviewed.  Constitutional:      General: He is not in acute distress. Neck:     Vascular: No JVD.  Cardiovascular:     Rate and Rhythm: Normal rate and regular rhythm.     Pulses: Normal pulses.     Heart sounds: Murmur heard.     Harsh midsystolic murmur is present with a grade of 1/6 at the upper right sternal border radiating to the neck.  Pulmonary:     Effort: Pulmonary effort is normal.     Breath sounds: Normal breath sounds. No wheezing or rales.  Musculoskeletal:     Right lower leg: No edema.     Left lower leg: No edema.     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  Hypertension: Controlled  Frequent PAC/PVC: Resolved  Mixed hyperlipidemia: Well controlled. Continue lipitor 20 mg for now.   CAD without angina: S/p CABG. No angina symptoms. Continue ASA, statin.   S/p bioprosthetic AVR: Stable.  F/u in 1 year    Nigel Mormon, MD Pager: [phone] Office: [phone]

## 2021-10-29 ENCOUNTER — Telehealth: Payer: Self-pay | Admitting: Family

## 2021-10-29 NOTE — Telephone Encounter (Signed)
Patient notified and agreed and scheduled an appointment for next Wednesday with [place].

## 2021-10-29 NOTE — Telephone Encounter (Signed)
Will need OV prior to prescribing medication.

## 2021-10-29 NOTE — Telephone Encounter (Signed)
Patient called to report his previous PCP prescribed Terbinafine cream for foot fungal skin infection. He states he is having the pharmacy fax the refill request to [place]. Also pt notes he is due lab work associated with using Terbinafine.

## 2021-10-29 NOTE — Telephone Encounter (Signed)
Patient requested refill.  Patient has a 6 Month appointment scheduled with Dinah for 03/23/2022  Pended Rx and sent to [facility] for approval due to Webb Silversmith out of office.

## 2021-11-03 ENCOUNTER — Encounter: Payer: Self-pay | Admitting: Family

## 2021-11-03 ENCOUNTER — Ambulatory Visit: Payer: Medicare PPO | Admitting: Family

## 2021-11-03 VITALS — BP 110/80 | HR 64 | Temp 97.1°F | Resp 16 | Ht 71.0 in | Wt 168.8 lb

## 2021-11-03 DIAGNOSIS — B353 Tinea pedis: Secondary | ICD-10-CM

## 2021-11-03 MED ORDER — TERBINAFINE HCL 250 MG PO TABS: 250.0000 mg | ORAL_TABLET | Freq: Every day | ORAL | 0 refills | Status: DC

## 2021-11-03 NOTE — Progress Notes (Signed)
Provider: Marlowe Sax FNP-C  Chalyn Amescua, Nelda Bucks, NP  Patient Care Team: Rhapsody Wolven, Nelda Bucks, NP as PCP - General (Family Medicine) Cardiovascular, [facility]  Extended Emergency Contact Information Primary Emergency Contact: Rose Phi Address: [place], [place] [address] Johnnette Litter of [place] Phone: [phone] Mobile Phone: [phone] Relation: Son Secondary Emergency Contact: cole,sue Address: [address]          [place], [place] [address] Johnnette Litter of [employer] Phone: [phone] Relation: Friend  Code Status:  Full Code  Goals of care: Advanced Directive information    11/03/2021   11:26 AM  Advanced Directives  Does Patient Have a Medical Advance Directive? Yes  Type of Paramedic of [place];Living will  Does patient want to make changes to medical advance directive? No - Patient declined  Copy of [place] in Chart? Yes - validated most recent copy scanned in chart (See row information)     Chief Complaint  Patient presents with   Medication Refill    Patient is requesting refill on foot cream.     HPI:  Pt is a 81 y.o. male seen today for an acute visit for Terbinafine Tablet for foot fungal.Has used cream in the past for 2 months.He requested refill recent was advised to follow up with provider to recheck liver enzymes and chemistry.  States fungal infection has improved but not resolved. Currently on Lamisil 250 mg tablet daily. He denies any other acute abnormalities.    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    HFrEF (heart failure with reduced ejection fraction) (HCC)    Hypertension    Moderate mitral regurgitation    Severe aortic stenosis    Past Surgical History:  Procedure Laterality Date   AORTIC VALVE REPLACEMENT  N/A 07/24/2017   Procedure: AORTIC VALVE REPLACEMENT (AVR) using 25m Inspiris Aortic Valve;  Surgeon: BGaye Pollack MD;  [facility];  Service: Open Heart Surgery;  Laterality: N/A;   CLIPPING OF ATRIAL APPENDAGE N/A 07/24/2017   Procedure: CLIPPING OF ATRIAL APPENDAGE using a 568mAtricure clip;  Surgeon: BaGaye PollackMD;  [facility] Service: [facility];  Laterality: N/A;   CORONARY ARTERY BYPASS GRAFT N/A 07/24/2017   Procedure: CORONARY ARTERY BYPASS GRAFTING (CABG) times 3 using the left greater saphenous vein harvested endoscopically and left internal mammary artery.;  Surgeon: BaGaye PollackMD;  [facility];  Service: Open Heart Surgery;  Laterality: N/A;   EYE SURGERY Bilateral    lens replacements for cataracts   HERNIA REPAIR     INGUINAL HERNIA REPAIR Right 05/06/2015   Procedure: LAPAROSCOPIC RIGHT INGUINAL HERNIA WITH MESH;  Surgeon: DoCoralie KeensMD;  [facility]: [facility];  Service: General;  Laterality: Right;   INSERTION OF MESH Right 05/06/2015   Procedure: INSERTION OF MESH;  Surgeon: DoCoralie KeensMD;  [facility];  Service: General;  Laterality: Right;   RIGHT/LEFT HEART CATH AND CORONARY ANGIOGRAPHY N/A 07/10/2017   Procedure: RIGHT/LEFT HEART CATH AND CORONARY ANGIOGRAPHY;  Surgeon: PaNigel MormonMD;  [facility];  Service: Cardiovascular;  Laterality: N/A;   TEE WITHOUT CARDIOVERSION N/A 07/11/2017   Procedure: TRANSESOPHAGEAL ECHOCARDIOGRAM (  TEE);  Surgeon: Nigel Mormon, MD;  Location: [facility];  Service: Cardiovascular;  Laterality: N/A;   TEE WITHOUT CARDIOVERSION N/A 07/24/2017   Procedure: TRANSESOPHAGEAL ECHOCARDIOGRAM (TEE);  Surgeon: Gaye Pollack, MD;  Location: [place];  Service: Open Heart Surgery;  Laterality: N/A;   TOTAL KNEE ARTHROPLASTY Right 08/07/2019   Procedure: TOTAL KNEE ARTHROPLASTY;  Surgeon: Gaynelle Arabian, MD;  [facility];  Service: Orthopedics;  Laterality: Right;    ULTRASOUND GUIDANCE FOR VASCULAR ACCESS  07/10/2017   Procedure: Ultrasound Guidance For Vascular Access;  Surgeon: Nigel Mormon, MD;  Location: [place] [facility];  Service: Cardiovascular;;    Allergies  Allergen Reactions   Sulfamethoxazole-Trimethoprim Rash    Outpatient Encounter Medications as of 11/03/2021  Medication Sig   acetaminophen (TYLENOL) 500 MG tablet Take 2 tablets (1,000 mg total) by mouth every 6 (six) hours as needed for mild pain or fever.   allopurinol (ZYLOPRIM) 100 MG tablet Take 100 mg by mouth every morning.   amLODipine (NORVASC) 5 MG tablet Take 5 mg by mouth daily.   aspirin EC 81 MG tablet Take 81 mg by mouth daily. Swallow whole.   atorvastatin (LIPITOR) 20 MG tablet Take 1 tablet (20 mg total) by mouth daily at 6 PM.   loratadine (CLARITIN) 10 MG tablet Take 10 mg by mouth daily.   Multiple Vitamin (MULTIVITAMIN ADULT PO) Take 1 tablet by mouth daily.   Phenylephrine HCl (PREPARATION H RE) Place rectally as needed.   psyllium (METAMUCIL) 58.6 % powder Take 1 packet by mouth daily as needed (regularity (3-4 times a week)).    tamsulosin (FLOMAX) 0.4 MG CAPS capsule Take 0.4 mg by mouth every evening.    [DISCONTINUED] terbinafine (LAMISIL) 250 MG tablet Take 250 mg by mouth daily.   terbinafine (LAMISIL) 250 MG tablet Take 1 tablet (250 mg total) by mouth daily.   No facility-administered encounter medications on file as of 11/03/2021.    Review of Systems  Constitutional:  Negative for appetite change, chills, fatigue and fever.  Respiratory:  Negative for cough, chest tightness and shortness of breath.   Cardiovascular:  Negative for chest pain, palpitations and leg swelling.  Gastrointestinal:  Negative for abdominal distention, abdominal pain, constipation, diarrhea, nausea and vomiting.  Skin:  Negative for pallor, rash and wound.       Fungal infection on all toenails     Immunization History  Administered Date(s) Administered    Influenza, High Dose Seasonal PF 11/24/2018   Influenza-Unspecified 11/24/2018   Moderna Sars-Covid-2 Vaccination 03/15/2019, 03/15/2019, 04/12/2019, 04/12/2019   Tdap 02/11/2013, 01/18/2019   Pertinent  Health Maintenance Due  Topic Date Due   INFLUENZA VACCINE  09/07/2021      08/07/2019   10:52 AM 08/07/2019    2:00 PM 08/07/2019    8:00 PM 09/20/2021    9:55 AM 11/03/2021   11:26 AM  Fall Risk  Falls in the past year?    0 0  Was there an injury with Fall?    0 0  Fall Risk Category Calculator    0 0  Fall Risk Category    Low Low  Patient Fall Risk Level Moderate fall risk High fall risk High fall risk Low fall risk Low fall risk  Patient at Risk for Falls Due to    No Fall Risks No Fall Risks  Fall risk Follow up    Falls evaluation completed Falls evaluation completed   Functional Status Survey:  Vitals:   11/03/21 1127  BP: 110/80  Pulse: 64  Resp: 16  Temp: (!) 97.1 F (36.2 C)  SpO2: 98%  Weight: 168 lb 12.8 oz (76.6 kg)  Height: '5\' 11"'  (1.803 m)   Body mass index is 23.54 kg/m. Physical Exam Vitals reviewed.  Constitutional:      General: He is not in acute distress.    Appearance: Normal appearance. He is normal weight. He is not ill-appearing or diaphoretic.  HENT:     Head: Normocephalic.     Mouth/Throat:     Mouth: Mucous membranes are moist.     Pharynx: Oropharynx is clear. No oropharyngeal exudate or posterior oropharyngeal erythema.  Eyes:     General: No scleral icterus.       Right eye: No discharge.        Left eye: No discharge.     Conjunctiva/sclera: Conjunctivae normal.     Pupils: Pupils are equal, round, and reactive to light.  Neck:     Vascular: No carotid bruit.  Cardiovascular:     Rate and Rhythm: Normal rate and regular rhythm.     Pulses: Normal pulses.     Heart sounds: Normal heart sounds. No murmur heard.    No friction rub. No gallop.  Pulmonary:     Effort: Pulmonary effort is normal. No respiratory distress.      Breath sounds: Normal breath sounds. No wheezing, rhonchi or rales.  Chest:     Chest wall: No tenderness.  Abdominal:     General: Bowel sounds are normal. There is no distension.     Palpations: Abdomen is soft. There is no mass.     Tenderness: There is no abdominal tenderness. There is no right CVA tenderness, left CVA tenderness, guarding or rebound.  Musculoskeletal:        General: No swelling or tenderness. Normal range of motion.     Cervical back: Normal range of motion. No rigidity or tenderness.     Right lower leg: No edema.     Left lower leg: No edema.  Feet:     Right foot:     Skin integrity: Skin integrity normal.     Toenail Condition: Fungal disease present.    Left foot:     Skin integrity: Skin integrity normal.     Toenail Condition: Fungal disease present. Lymphadenopathy:     Cervical: No cervical adenopathy.  Skin:    General: Skin is warm and dry.     Coloration: Skin is not pale.     Findings: No bruising, erythema, lesion or rash.  Neurological:     Mental Status: He is alert and oriented to person, place, and time.     Motor: No weakness.     Gait: Gait normal.  Psychiatric:        Mood and Affect: Mood normal.        Speech: Speech normal.        Behavior: Behavior normal.     Labs reviewed: Recent Labs    09/20/21 1048 11/03/21 1141  NA 138 139  K 5.3 5.5*  CL 103 104  CO2 26 26  GLUCOSE 96 97  BUN 13 15  CREATININE 1.00 0.99  CALCIUM 9.7 10.0   Recent Labs    09/20/21 1048 11/03/21 1141  AST 14 19  ALT 11 15  BILITOT 0.6 0.5  PROT 6.9 7.2   Recent Labs    09/20/21 1048  WBC 3.6*  NEUTROABS  2,466  HGB 12.8*  HCT 37.2*  MCV 96.4  PLT 160   Lab Results  Component Value Date   TSH 1.63 09/20/2021   Lab Results  Component Value Date   HGBA1C 5.7 (H) 07/20/2017   Lab Results  Component Value Date   CHOL 162 09/20/2021   HDL 61 09/20/2021   LDLCALC 83 09/20/2021   TRIG 88 09/20/2021   CHOLHDL 2.7 09/20/2021     Significant Diagnostic Results in last 30 days:  No results found.  Assessment/Plan  Tinea pedis of both feet Grayish-yellowish  discoloration noted on all toenails. -We will continue on terbinafine We will recheck liver enzymes today. Consider referral to podiatrist if not resolved - terbinafine (LAMISIL) 250 MG tablet; Take 1 tablet (250 mg total) by mouth daily.  Dispense: 60 tablet; Refill: 0 - CMP with eGFR([facility])  Family/ staff Communication: Reviewed plan of care with patient verbalized understanding  Labs/tests ordered:  - CMP with eGFR([facility])  Next Appointment: Return in about 3 months (around 02/02/2022), or if symptoms worsen or fail to improve, for fungal infection .   Sandrea Hughs, NP

## 2021-11-04 LAB — COMPLETE METABOLIC PANEL WITH GFR
AG Ratio: 1.7 (calc) (ref 1.0–2.5)
ALT: 15 U/L (ref 9–46)
AST: 19 U/L (ref 10–35)
Albumin: 4.5 g/dL (ref 3.6–5.1)
Alkaline phosphatase (APISO): 64 U/L (ref 35–144)
BUN: 15 mg/dL (ref 7–25)
CO2: 26 mmol/L (ref 20–32)
Calcium: 10 mg/dL (ref 8.6–10.3)
Chloride: 104 mmol/L (ref 98–110)
Creat: 0.99 mg/dL (ref 0.70–1.22)
Globulin: 2.7 g/dL (calc) (ref 1.9–3.7)
Glucose, Bld: 97 mg/dL (ref 65–99)
Potassium: 5.5 mmol/L — ABNORMAL HIGH (ref 3.5–5.3)
Sodium: 139 mmol/L (ref 135–146)
Total Bilirubin: 0.5 mg/dL (ref 0.2–1.2)
Total Protein: 7.2 g/dL (ref 6.1–8.1)
eGFR: 77 mL/min/{1.73_m2} (ref >=60)

## 2021-11-08 NOTE — Telephone Encounter (Signed)
Ken,on your recent lab work your Glucose level was 97 (11/03/2021 ) and 94 on 10/21/2021.we usually check for type 2 Diabetes Mellitus if your Glucose level is 100 and above.Looks like with your change of diet your level are now down to normal range.Kudos!! Continue with dietary modification and exercise.

## 2021-11-09 ENCOUNTER — Other Ambulatory Visit: Payer: Self-pay

## 2021-11-09 DIAGNOSIS — E875 Hyperkalemia: Secondary | ICD-10-CM

## 2021-11-09 NOTE — Telephone Encounter (Signed)
Awesome ! Keep eating healthy and exercising.

## 2021-11-15 DIAGNOSIS — R350 Frequency of micturition: Secondary | ICD-10-CM | POA: Diagnosis not present

## 2021-11-15 DIAGNOSIS — R351 Nocturia: Secondary | ICD-10-CM | POA: Diagnosis not present

## 2021-11-15 DIAGNOSIS — N401 Enlarged prostate with lower urinary tract symptoms: Secondary | ICD-10-CM | POA: Diagnosis not present

## 2021-11-16 ENCOUNTER — Other Ambulatory Visit: Payer: Medicare PPO

## 2021-11-16 DIAGNOSIS — E875 Hyperkalemia: Secondary | ICD-10-CM

## 2021-11-17 LAB — BASIC METABOLIC PANEL WITH GFR
BUN: 11 mg/dL (ref 7–25)
CO2: 27 mmol/L (ref 20–32)
Calcium: 9.3 mg/dL (ref 8.6–10.3)
Chloride: 102 mmol/L (ref 98–110)
Creat: 0.99 mg/dL (ref 0.70–1.22)
Glucose, Bld: 87 mg/dL (ref 65–99)
Potassium: 4.7 mmol/L (ref 3.5–5.3)
Sodium: 136 mmol/L (ref 135–146)
eGFR: 77 mL/min/{1.73_m2} (ref >=60)

## 2021-11-29 NOTE — Telephone Encounter (Signed)
Please indicate reason for Home Health ? Fatigue or unsteady gait ?

## 2021-12-01 ENCOUNTER — Encounter (HOSPITAL_COMMUNITY): Payer: Self-pay

## 2021-12-01 ENCOUNTER — Ambulatory Visit (HOSPITAL_COMMUNITY)
Admission: EM | Admit: 2021-12-01 | Discharge: 2021-12-01 | Disposition: A | Discharge status: Home / Self Care | Payer: Medicare PPO | Attending: Emergency Medicine | Admitting: Emergency Medicine

## 2021-12-01 ENCOUNTER — Ambulatory Visit (INDEPENDENT_AMBULATORY_CARE_PROVIDER_SITE_OTHER): Payer: Medicare PPO

## 2021-12-01 DIAGNOSIS — S161XXA Strain of muscle, fascia and tendon at neck level, initial encounter: Secondary | ICD-10-CM | POA: Diagnosis not present

## 2021-12-01 DIAGNOSIS — M542 Cervicalgia: Secondary | ICD-10-CM

## 2021-12-01 MED ORDER — ACETAMINOPHEN 325 MG PO TABS
650.0000 mg | ORAL_TABLET | Freq: Once | ORAL | Status: AC
  Administered 2021-12-01: 650 mg via ORAL

## 2021-12-01 MED ORDER — ACETAMINOPHEN 325 MG PO TABS
ORAL_TABLET | ORAL | Status: AC
  Filled 2021-12-01: qty 2

## 2021-12-01 NOTE — ED Triage Notes (Signed)
Pt is here for stiff neck  shooting down to the shoulders xfew days

## 2021-12-01 NOTE — Discharge Instructions (Signed)
I recommend using tylenol every 6 hours to help with inflammation and pain.  You can apply heat or ice.  I recommend follow up with your primary care provider if symptoms persist.

## 2021-12-01 NOTE — ED Provider Notes (Signed)
[place]    CSN: [id number] Arrival date & time: 12/01/21  1004     History   Chief Complaint Chief Complaint  Patient presents with   Neck Pain    HPI [name] is a 81 y.o. male.  Presents with 4 day history of bilateral neck pain Worse with turning head Feels stiff. 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 No back pain. No fevers or recent illness. Denies history of neck or back issues  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    HFrEF (heart failure with reduced ejection fraction) (HCC)    Hypertension    Moderate mitral regurgitation    Severe aortic stenosis     Patient Active Problem List   Diagnosis Date Noted   PAC (premature atrial contraction) 03/01/2021   PVC (premature ventricular contraction) 03/01/2021   Atrial bigeminy 02/28/2020   OA (osteoarthritis) of knee 08/07/2019   Primary osteoarthritis of right knee 08/07/2019   Coronary artery disease involving coronary bypass graft of native heart without angina pectoris 09/19/2018   Fatigue 08/01/2018   Mixed hyperlipidemia 06/06/2018   S/P left atrial appendage ligation 07/25/2017   S/P AVR 07/25/2017   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   Coronary artery disease without angina pectoris 07/12/2017   Mitral regurgitation 07/07/2017   Acute on chronic systolic heart failure ([place]) 07/07/2017   Gout 02/12/2013   Essential hypertension 02/12/2013   GERD (gastroesophageal reflux disease) 02/12/2013    Past Surgical History:  Procedure Laterality Date   AORTIC VALVE REPLACEMENT N/A 07/24/2017   Procedure: AORTIC VALVE REPLACEMENT (AVR) using 78m Inspiris Aortic Valve;  Surgeon:  BGaye Pollack MD;  [facility];  Service: Open Heart Surgery;  Laterality: N/A;   CLIPPING OF ATRIAL APPENDAGE N/A 07/24/2017   Procedure: CLIPPING OF ATRIAL APPENDAGE using a 512mAtricure clip;  Surgeon: BaGaye PollackMD;  [facility] Service: Open Heart Surgery;  Laterality: N/A;   CORONARY ARTERY BYPASS GRAFT N/A 07/24/2017   Procedure: CORONARY ARTERY BYPASS GRAFTING (CABG) times 3 using the left greater saphenous vein harvested endoscopically and left internal mammary artery.;  Surgeon: BaGaye PollackMD;  [facility];  Service: Open Heart Surgery;  Laterality: N/A;   EYE SURGERY Bilateral    lens replacements for cataracts   HERNIA REPAIR     INGUINAL HERNIA REPAIR Right 05/06/2015   Procedure: LAPAROSCOPIC RIGHT INGUINAL HERNIA WITH MESH;  Surgeon: DoCoralie KeensMD;  [facility];  Service: General;  Laterality: Right;   INSERTION OF MESH Right 05/06/2015   Procedure: INSERTION OF MESH;  Surgeon: DoCoralie KeensMD;  [facility];  Service: General;  Laterality: Right;   RIGHT/LEFT HEART CATH AND CORONARY ANGIOGRAPHY N/A 07/10/2017   Procedure: RIGHT/LEFT HEART CATH AND CORONARY ANGIOGRAPHY;  Surgeon: PaNigel MormonMD;  [facility];  Service: Cardiovascular;  Laterality: N/A;   TEE WITHOUT CARDIOVERSION N/A 07/11/2017   Procedure: TRANSESOPHAGEAL ECHOCARDIOGRAM (TEE);  Surgeon: PaNigel MormonMD;  [facility];  Service: Cardiovascular;  Laterality: N/A;   TEE WITHOUT  CARDIOVERSION N/A 07/24/2017   Procedure: TRANSESOPHAGEAL ECHOCARDIOGRAM (TEE);  Surgeon: Gaye Pollack, MD;  Location: [place];  Service: Open Heart Surgery;  Laterality: N/A;   TOTAL KNEE ARTHROPLASTY Right 08/07/2019   Procedure: TOTAL KNEE ARTHROPLASTY;  Surgeon: Gaynelle Arabian, MD;  [facility];  Service: Orthopedics;  Laterality: Right;   ULTRASOUND GUIDANCE FOR VASCULAR ACCESS  07/10/2017   Procedure: Ultrasound Guidance For Vascular Access;   Surgeon: Nigel Mormon, MD;  Location: [place] CV LAB;  Service: Cardiovascular;;       Home Medications    Prior to Admission medications   Medication Sig Start Date End Date Taking? Authorizing Provider  acetaminophen (TYLENOL) 500 MG tablet Take 2 tablets (1,000 mg total) by mouth every 6 (six) hours as needed for mild pain or fever. 07/29/17   Barrett, Erin R, PA-C  allopurinol (ZYLOPRIM) 100 MG tablet Take 100 mg by mouth every morning.    [provider]  amLODipine (NORVASC) 5 MG tablet Take 5 mg by mouth daily.    [provider]  aspirin EC 81 MG tablet Take 81 mg by mouth daily. Swallow whole.    [provider]  atorvastatin (LIPITOR) 20 MG tablet Take 1 tablet (20 mg total) by mouth daily at 6 PM. 11/09/18   Patwardhan, Manish J, MD  loratadine (CLARITIN) 10 MG tablet Take 10 mg by mouth daily.    [provider]  Multiple Vitamin (MULTIVITAMIN ADULT PO) Take 1 tablet by mouth daily.    [provider]  Phenylephrine HCl (PREPARATION H RE) Place rectally as needed.    [provider]  psyllium (METAMUCIL) 58.6 % powder Take 1 packet by mouth daily as needed (regularity (3-4 times a week)).     [provider]  tamsulosin (FLOMAX) 0.4 MG CAPS capsule Take 0.4 mg by mouth every evening.     [provider]  terbinafine (LAMISIL) 250 MG tablet Take 1 tablet (250 mg total) by mouth daily. 11/03/21   Ngetich, Nelda Bucks, NP    Family History Family History  Problem Relation Age of Onset   Stroke Mother    Heart attack Father     Social History Social History   Tobacco Use   Smoking status: Former    Packs/day: 1.00    Years: 12.00    Total pack years: 12.00    Types: Cigarettes    Quit date: 1973    Years since quitting: 50.8   Smokeless tobacco: Never   Tobacco comments:    quit 30-40 yrs ago  Vaping Use   Vaping Use: Never used  Substance Use Topics   Alcohol use: Yes    Comment: 1-2  drinks   Drug use: Yes    Types: Marijuana     Allergies   Sulfamethoxazole-trimethoprim   Review of Systems Review of Systems  Musculoskeletal:  Positive for neck pain.   Per HPI  Physical Exam Triage Vital Signs ED Triage Vitals  Enc Vitals Group     BP 12/01/21 1134 (!) 158/78     Pulse Rate 12/01/21 1134 68     Resp 12/01/21 1134 12     Temp 12/01/21 1134 (!) 97.1 F (36.2 C)     Temp Source 12/01/21 1134 Oral     SpO2 12/01/21 1134 97 %     Weight --      Height --      Head Circumference --      Peak Flow --  Pain Score 12/01/21 1132 6     Pain Loc --      Pain Edu? --      Excl. in [place]? --    No data found.  Updated Vital Signs BP (!) 158/78 (BP Location: Right Arm)   Pulse 68   Temp (!) 97.1 F (36.2 C) (Oral)   Resp 12   SpO2 97%    Physical Exam Vitals and nursing note reviewed.  Constitutional:      General: He is not in acute distress.    Appearance: He is not ill-appearing.  Eyes:     Extraocular Movements: Extraocular movements intact.     Conjunctiva/sclera: Conjunctivae normal.     Pupils: Pupils are equal, round, and reactive to light.  Cardiovascular:     Rate and Rhythm: Normal rate and regular rhythm.     Heart sounds: Normal heart sounds.  Pulmonary:     Effort: Pulmonary effort is normal.     Breath sounds: Normal breath sounds.  Musculoskeletal:     Cervical back: No bony tenderness. Pain with movement present.     Thoracic back: Normal.     Right lower leg: No edema.     Left lower leg: No edema.     Comments: Cervical paraspinals mildly tender to palpation. No bony or spinal tenderness. Decreased ROM due to pain  Skin:    General: Skin is warm and dry.  Neurological:     General: No focal deficit present.     Mental Status: He is alert and oriented to person, place, and time.     Cranial Nerves: No facial asymmetry.     Sensory: Sensation is intact.     Motor: Motor function is intact. No weakness, tremor or  pronator drift.     Coordination: Coordination is intact.     Gait: Gait is intact.     Comments: Strength 5/5 upper extremities      UC Treatments / Results  Labs (all labs ordered are listed, but only abnormal results are displayed) Labs Reviewed - No data to display  EKG  Radiology DG Cervical Spine Complete  Result Date: 12/01/2021 CLINICAL DATA:  Neck pain and stiffness radiating to the shoulders. EXAM: CERVICAL SPINE - COMPLETE 4+ VIEW COMPARISON:  None Available. FINDINGS: Straightening of the normal cervical lordosis. 1-2 mm degenerative anterolisthesis C3-4 and C4-5. Chronic degenerative disc space narrowing C5-6 and C6-7. Bony foraminal narrowing at C5-6 and C6-7. Carotid bifurcation region calcification incidentally noted. IMPRESSION: 1. 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 Bony foraminal narrowing at these levels. 2. 1-2 mm degenerative anterolisthesis C3-4 and C4-5. 3. Carotid bifurcation region calcification. Electronically Signed   By: Nelson Chimes M.D.   On: 12/01/2021 12:39    Procedures Procedures   Medications Ordered in UC Medications  acetaminophen (TYLENOL) tablet 650 mg (650 mg Oral Given 12/01/21 1225)    Initial Impression / Assessment and Plan / UC Course  I have reviewed the triage vital signs and the nursing notes.  Pertinent labs & imaging results that were available during my care of the patient were reviewed by me and considered in my medical decision making (see chart for details).  Tylenol dose given, improvement in ROM  Likely muscular, no overt red flags apart from patient age thus imaging obtained to rule out disc abnormality  C-spine xray with chronic degenerative changes, degenerative anterolisthesis. No acute abnormality Discussed musculoskeletal nature, could be from posture. Recommend tylenol, ice/heat as needed, follow up  with PCP. ED precautions. Patient agrees to plan   Final Clinical Impressions(s) / UC Diagnoses    Final diagnoses:  Discogenic cervical pain  Acute strain of neck muscle, initial encounter     Discharge Instructions      I recommend using tylenol every 6 hours to help with inflammation and pain.  You can apply heat or ice.  I recommend follow up with your primary care provider if symptoms persist.     ED Prescriptions   None    PDMP not reviewed this encounter.   Dakin Madani, [place] Gu[place], [place] 12/01/21 1326

## 2021-12-03 NOTE — Telephone Encounter (Signed)
I have written an order for Physical therapy to evaluate.Order will be faxed to [facility].

## 2021-12-13 ENCOUNTER — Other Ambulatory Visit: Payer: Self-pay

## 2021-12-13 DIAGNOSIS — M6281 Muscle weakness (generalized): Secondary | ICD-10-CM | POA: Diagnosis not present

## 2021-12-22 DIAGNOSIS — M6281 Muscle weakness (generalized): Secondary | ICD-10-CM | POA: Diagnosis not present

## 2021-12-27 DIAGNOSIS — M6281 Muscle weakness (generalized): Secondary | ICD-10-CM | POA: Diagnosis not present

## 2022-01-05 DIAGNOSIS — M6281 Muscle weakness (generalized): Secondary | ICD-10-CM | POA: Diagnosis not present

## 2022-01-11 ENCOUNTER — Other Ambulatory Visit: Payer: Self-pay | Admitting: Family

## 2022-01-11 DIAGNOSIS — B353 Tinea pedis: Secondary | ICD-10-CM

## 2022-01-11 NOTE — Telephone Encounter (Signed)
Patient has request refill on medication Terbinafine '250mg'$ . Patient was last given 60 tablets. Medication pend and sent to PCP Ngetich, Nelda Bucks, NP for approval.

## 2022-01-12 DIAGNOSIS — M6281 Muscle weakness (generalized): Secondary | ICD-10-CM | POA: Diagnosis not present

## 2022-01-18 ENCOUNTER — Ambulatory Visit: Payer: Medicare PPO | Admitting: Family

## 2022-01-18 ENCOUNTER — Encounter: Payer: Self-pay | Admitting: Family

## 2022-01-18 VITALS — BP 120/60 | HR 66 | Temp 98.0°F | Resp 16 | Ht 71.0 in | Wt 168.4 lb

## 2022-01-18 DIAGNOSIS — Z23 Encounter for immunization: Secondary | ICD-10-CM | POA: Diagnosis not present

## 2022-01-18 DIAGNOSIS — D649 Anemia, unspecified: Secondary | ICD-10-CM

## 2022-01-18 DIAGNOSIS — K219 Gastro-esophageal reflux disease without esophagitis: Secondary | ICD-10-CM | POA: Diagnosis not present

## 2022-01-18 DIAGNOSIS — N400 Enlarged prostate without lower urinary tract symptoms: Secondary | ICD-10-CM | POA: Diagnosis not present

## 2022-01-18 DIAGNOSIS — I10 Essential (primary) hypertension: Secondary | ICD-10-CM

## 2022-01-18 LAB — CBC WITH DIFFERENTIAL/PLATELET
Absolute Monocytes: 485 cells/uL (ref 200–950)
Basophils Absolute: 51 cells/uL (ref 0–200)
Basophils Relative: 1 %
Eosinophils Absolute: 102 cells/uL (ref 15–500)
Eosinophils Relative: 2 %
HCT: 38 % — ABNORMAL LOW (ref 38.5–50.0)
Hemoglobin: 12.9 g/dL — ABNORMAL LOW (ref 13.2–17.1)
Lymphs Abs: 816 cells/uL — ABNORMAL LOW (ref 850–3900)
MCH: 32.6 pg (ref 27.0–33.0)
MCHC: 33.9 g/dL (ref 32.0–36.0)
MCV: 96 fL (ref 80.0–100.0)
MPV: 10.8 fL (ref 7.5–12.5)
Monocytes Relative: 9.5 %
Neutro Abs: 3647 cells/uL (ref 1500–7800)
Neutrophils Relative %: 71.5 %
Platelets: 176 10*3/uL (ref 140–400)
RBC: 3.96 10*6/uL — ABNORMAL LOW (ref 4.20–5.80)
RDW: 11.9 % (ref 11.0–15.0)
Total Lymphocyte: 16 %
WBC: 5.1 10*3/uL (ref 3.8–10.8)

## 2022-01-18 NOTE — Progress Notes (Signed)
Provider: Marlowe Sax FNP-C   Tymeshia Awan, Nelda Bucks, NP  Patient Care Team: Retal Tonkinson, Nelda Bucks, NP as PCP - General (Family Medicine) Cardiovascular, [facility]  Extended Emergency Contact Information Primary Emergency Contact: Rose Phi Address: [place], [place] [address] Johnnette Litter of [place] Phone: [phone] Mobile Phone: [phone] Relation: Son Secondary Emergency Contact: cole,sue Address: [address]          [place], [place] [address] Johnnette Litter of [employer] Phone: [phone] Relation: Friend  Code Status:  Full code  Goals of care: Advanced Directive information    01/18/2022   10:48 AM  Advanced Directives  Does Patient Have a Medical Advance Directive? Yes  Type of Paramedic of [place];Living will;Out of facility DNR (pink MOST or yellow form)  Does patient want to make changes to medical advance directive? No - Patient declined  Copy of [place] in Chart? Yes - validated most recent copy scanned in chart (See row information)     Chief Complaint  Patient presents with   Medical Management of Chronic Issues    3 month follow up.    Health Maintenance    Discuss the need for AWV.   Immunizations    Discuss the need for Pne vaccine, Influenza vaccine, and Covid Booster.    HPI:  Pt is a 81 y.o. male seen today for 3 months follow up for medical management of chronic diseases.   Lives in retirement community with wife who is under hospice.has several friends who assist him so he can have personal.   Exercises by riding bike and goes to the [facility] and plays golf twice a week.  Has had additional weight loss 2 lbs since last visit. Shoulder and neck pain Has been working with Physical Therapy once a week.Has one more week then will end.   Due for Influenza ,PNA and COVID-19 booster vaccine.Aware to get COVID-19 vaccine at the pharmacy.   Past Medical History:  Diagnosis  Date   Anemia    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    HFrEF (heart failure with reduced ejection fraction) (HCC)    Hypertension    Moderate mitral regurgitation    Severe aortic stenosis    Past Surgical History:  Procedure Laterality Date   AORTIC VALVE REPLACEMENT N/A 07/24/2017   Procedure: AORTIC VALVE REPLACEMENT (AVR) using 34m Inspiris Aortic Valve;  Surgeon: BGaye Pollack MD;  Location: [facility];  Service: Open Heart Surgery;  Laterality: N/A;   CLIPPING OF ATRIAL APPENDAGE N/A 07/24/2017   Procedure: CLIPPING OF ATRIAL APPENDAGE using a 586mAtricure clip;  Surgeon: BaGaye PollackMD;  [facility] Service: Open Heart Surgery;  Laterality: N/A;   CORONARY ARTERY BYPASS GRAFT N/A 07/24/2017   Procedure: CORONARY ARTERY BYPASS GRAFTING (CABG) times 3 using the left greater saphenous vein harvested endoscopically and left internal mammary artery.;  Surgeon: BaGaye PollackMD;  [facility];  Service: Open Heart Surgery;  Laterality: N/A;   EYE SURGERY Bilateral    lens replacements for cataracts   HERNIA REPAIR     INGUINAL HERNIA REPAIR Right 05/06/2015   Procedure: LAPAROSCOPIC RIGHT INGUINAL HERNIA WITH MESH;  Surgeon: DoCoralie KeensMD;  Location: [facility];  Service: General;  Laterality:  Right;   INSERTION OF MESH Right 05/06/2015   Procedure: INSERTION OF MESH;  Surgeon: Coralie Keens, MD;  Location: [facility];  Service: General;  Laterality: Right;   RIGHT/LEFT HEART CATH AND CORONARY ANGIOGRAPHY N/A 07/10/2017   Procedure: RIGHT/LEFT HEART CATH AND CORONARY ANGIOGRAPHY;  Surgeon: Nigel Mormon, MD;  [facility];  Service: Cardiovascular;  Laterality: N/A;   TEE WITHOUT CARDIOVERSION N/A 07/11/2017   Procedure: TRANSESOPHAGEAL ECHOCARDIOGRAM (TEE);  Surgeon: Nigel Mormon, MD;  [facility] ENDOSCOPY;  Service: Cardiovascular;  Laterality:  N/A;   TEE WITHOUT CARDIOVERSION N/A 07/24/2017   Procedure: TRANSESOPHAGEAL ECHOCARDIOGRAM (TEE);  Surgeon: Gaye Pollack, MD;  Location: [place];  Service: Open Heart Surgery;  Laterality: N/A;   TOTAL KNEE ARTHROPLASTY Right 08/07/2019   Procedure: TOTAL KNEE ARTHROPLASTY;  Surgeon: Gaynelle Arabian, MD;  Location: [facility];  Service: Orthopedics;  Laterality: Right;   ULTRASOUND GUIDANCE FOR VASCULAR ACCESS  07/10/2017   Procedure: Ultrasound Guidance For Vascular Access;  Surgeon: Nigel Mormon, MD;  Location: [facility] [facility];  Service: Cardiovascular;;    Allergies  Allergen Reactions   Sulfamethoxazole-Trimethoprim Rash    Allergies as of 01/18/2022       Reactions   Sulfamethoxazole-trimethoprim Rash        Medication List        Accurate as of January 18, 2022 11:18 AM. If you have any questions, ask your nurse or doctor.          STOP taking these medications    terbinafine 250 MG tablet Commonly known as: LAMISIL Stopped by: Sandrea Hughs, NP       TAKE these medications    acetaminophen 500 MG tablet Commonly known as: TYLENOL Take 2 tablets (1,000 mg total) by mouth every 6 (six) hours as needed for mild pain or fever.   allopurinol 100 MG tablet Commonly known as: ZYLOPRIM Take 100 mg by mouth every morning.   amLODipine 5 MG tablet Commonly known as: NORVASC Take 5 mg by mouth daily.   aspirin EC 81 MG tablet Take 81 mg by mouth daily. Swallow whole.   atorvastatin 20 MG tablet Commonly known as: LIPITOR Take 1 tablet (20 mg total) by mouth daily at 6 PM.   Fish Oil 1000 MG Caps Take 2 capsules by mouth daily.   loratadine 10 MG tablet Commonly known as: CLARITIN Take 10 mg by mouth as needed for allergies, rhinitis or itching.   MULTIVITAMIN ADULT PO Take 1 tablet by mouth daily.   PREPARATION H RE Place rectally as needed.   psyllium 58.6 % powder Commonly known as: METAMUCIL Take 1 packet by mouth daily as needed  (regularity (3-4 times a week)).   silodosin 8 MG Caps capsule Commonly known as: RAPAFLO Take 8 mg by mouth daily.        Review of Systems  Constitutional:  Negative for appetite change, chills, fatigue, fever and unexpected weight change.  HENT:  Negative for congestion, dental problem, ear discharge, ear pain, facial swelling, hearing loss, nosebleeds, postnasal drip, rhinorrhea, sinus pressure, sinus pain, sneezing, sore throat, tinnitus and trouble swallowing.   Eyes:  Negative for pain, discharge, redness, itching and visual disturbance.  Respiratory:  Negative for cough, chest tightness, shortness of breath and wheezing.   Cardiovascular:  Negative for chest pain, palpitations and leg swelling.  Gastrointestinal:  Negative for abdominal distention, abdominal pain, blood in stool, constipation, diarrhea, nausea and vomiting.  Endocrine: Negative for cold  intolerance, heat intolerance, polydipsia, polyphagia and polyuria.  Genitourinary:  Negative for difficulty urinating, dysuria, flank pain, frequency and urgency.  Musculoskeletal:  Negative for arthralgias, back pain, gait problem, joint swelling, myalgias, neck pain and neck stiffness.  Skin:  Negative for color change, pallor, rash and wound.       Left upper chest small scab area does not recall what caused it or how long he has had it. No itching or drainage.   Neurological:  Negative for dizziness, syncope, speech difficulty, weakness, light-headedness, numbness and headaches.  Hematological:  Does not bruise/bleed easily.  Psychiatric/Behavioral:  Negative for agitation, behavioral problems, confusion, hallucinations, self-injury, sleep disturbance and suicidal ideas. The patient is not nervous/anxious.     Immunization History  Administered Date(s) Administered   Influenza, High Dose Seasonal PF 11/24/2018   Influenza-Unspecified 11/24/2018   Moderna Sars-Covid-2 Vaccination 03/15/2019, 03/15/2019, 04/12/2019,  04/12/2019   Tdap 02/11/2013, 01/18/2019   Zoster Recombinat (Shingrix) 02/26/2019, 05/20/2019   Pertinent  Health Maintenance Due  Topic Date Due   INFLUENZA VACCINE  09/07/2021      08/07/2019    8:00 PM 09/20/2021    9:55 AM 11/03/2021   11:26 AM 12/01/2021   11:34 AM 01/18/2022   10:48 AM  Fall Risk  Falls in the past year?  0 0  0  Was there an injury with Fall?  0 0  0  Fall Risk Category Calculator  0 0  0  Fall Risk Category  Low Low  Low  Patient Fall Risk Level High fall risk Low fall risk Low fall risk Low fall risk Low fall risk  Patient at Risk for Falls Due to  No Fall Risks No Fall Risks  No Fall Risks  Fall risk Follow up  Falls evaluation completed Falls evaluation completed  Falls evaluation completed   Functional Status Survey:    Vitals:   01/18/22 1037  BP: (!) 140/80  Pulse: 66  Resp: 16  Temp: 98 F (36.7 C)  SpO2: 98%  Weight: 168 lb 6.4 oz (76.4 kg)  Height: '5\' 11"'$  (1.803 m)   Body mass index is 23.49 kg/m. Physical Exam Vitals reviewed.  Constitutional:      General: He is not in acute distress.    Appearance: Normal appearance. He is normal weight. He is not ill-appearing or diaphoretic.  HENT:     Head: Normocephalic.     Right Ear: Tympanic membrane, ear canal and external ear normal. There is no impacted cerumen.     Left Ear: Tympanic membrane, ear canal and external ear normal. There is no impacted cerumen.     Nose: Nose normal. No congestion or rhinorrhea.     Mouth/Throat:     Mouth: Mucous membranes are moist.     Pharynx: Oropharynx is clear. No oropharyngeal exudate or posterior oropharyngeal erythema.  Eyes:     General: No scleral icterus.       Right eye: No discharge.        Left eye: No discharge.     Extraocular Movements: Extraocular movements intact.     Conjunctiva/sclera: Conjunctivae normal.     Pupils: Pupils are equal, round, and reactive to light.  Neck:     Vascular: No carotid bruit.  Cardiovascular:      Rate and Rhythm: Normal rate and regular rhythm.     Pulses: Normal pulses.     Heart sounds: Normal heart sounds. No murmur heard.    No friction rub. No gallop.  Pulmonary:     Effort: Pulmonary effort is normal. No respiratory distress.     Breath sounds: Normal breath sounds. No wheezing, rhonchi or rales.  Chest:     Chest wall: No tenderness.  Abdominal:     General: Bowel sounds are normal. There is no distension.     Palpations: Abdomen is soft. There is no mass.     Tenderness: There is no abdominal tenderness. There is no right CVA tenderness, left CVA tenderness, guarding or rebound.  Musculoskeletal:        General: No swelling or tenderness. Normal range of motion.     Cervical back: Normal range of motion. No rigidity or tenderness.     Right lower leg: No edema.     Left lower leg: No edema.  Lymphadenopathy:     Cervical: No cervical adenopathy.  Skin:    General: Skin is warm and dry.     Coloration: Skin is not pale.     Findings: No bruising, erythema, lesion or rash.     Comments: Small scabbed area without any signs of infection.   Neurological:     Mental Status: He is alert and oriented to person, place, and time.     Cranial Nerves: No cranial nerve deficit.     Sensory: No sensory deficit.     Motor: No weakness.     Coordination: Coordination normal.     Gait: Gait normal.  Psychiatric:        Mood and Affect: Mood normal.        Speech: Speech normal.        Behavior: Behavior normal.        Thought Content: Thought content normal.        Judgment: Judgment normal.     Labs reviewed: Recent Labs    09/20/21 1048 11/03/21 1141 11/16/21 1134  NA 138 139 136  K 5.3 5.5* 4.7  CL 103 104 102  CO2 '26 26 27  '$ GLUCOSE 96 97 87  BUN '13 15 11  '$ CREATININE 1.00 0.99 0.99  CALCIUM 9.7 10.0 9.3   Recent Labs    09/20/21 1048 11/03/21 1141  AST 14 19  ALT 11 15  BILITOT 0.6 0.5  PROT 6.9 7.2   Recent Labs    09/20/21 1048  WBC 3.6*   NEUTROABS 2,466  HGB 12.8*  HCT 37.2*  MCV 96.4  PLT 160   Lab Results  Component Value Date   TSH 1.63 09/20/2021   Lab Results  Component Value Date   HGBA1C 5.7 (H) 07/20/2017   Lab Results  Component Value Date   CHOL 162 09/20/2021   HDL 61 09/20/2021   LDLCALC 83 09/20/2021   TRIG 88 09/20/2021   CHOLHDL 2.7 09/20/2021    Significant Diagnostic Results in last 30 days:  No results found.  Assessment/Plan 1. Essential hypertension B/p stable  - continue on amlodipine  - - continue on ASA and Statin for cardiovascular event prevention  - continue with dietary modification and exercise at least 3 times per week for 30 minutes.  2. Gastroesophageal reflux disease without esophagitis Negative exam findings   3. Benign prostatic hyperplasia without lower urinary tract symptoms Reports no urine retention  - advised to continue on Silodosin but states would like to stop medication.   4. Anemia, unspecified type Hgb 12.8  - No signs of bleeding reported  - continue on MVI and also encouraged iron rich foods.  - CBC with  Differential/Platelet  5. Need for influenza vaccination Afebrile  Flut shot administered by CMA no acute reaction reported.  - Flu Vaccine QUAD High Dose(Fluad)  6. Need for pneumococcal 20-valent conjugate vaccination Afebrile  Prevnar 20 vaccine administered today by CMA no reaction reported.   - Pneumococcal conjugate vaccine 20-valent (Prevnar 20)  Family/ staff Communication: Reviewed plan of care with patient verbalized understanding.   Labs/tests ordered:  - CBC with Differential/Platelet  Next Appointment : As needed if symptoms worsen or fail to improve.    Sandrea Hughs, NP

## 2022-01-21 DIAGNOSIS — M6281 Muscle weakness (generalized): Secondary | ICD-10-CM | POA: Diagnosis not present

## 2022-02-21 ENCOUNTER — Other Ambulatory Visit: Payer: Self-pay | Admitting: Family

## 2022-02-21 DIAGNOSIS — B353 Tinea pedis: Secondary | ICD-10-CM

## 2022-03-23 ENCOUNTER — Encounter: Payer: Self-pay | Admitting: Family

## 2022-03-23 ENCOUNTER — Ambulatory Visit: Payer: Medicare PPO | Admitting: Family

## 2022-03-23 VITALS — BP 119/88 | HR 75 | Temp 97.2°F | Resp 18 | Ht 71.0 in | Wt 167.5 lb

## 2022-03-23 DIAGNOSIS — E782 Mixed hyperlipidemia: Secondary | ICD-10-CM

## 2022-03-23 DIAGNOSIS — M1000 Idiopathic gout, unspecified site: Secondary | ICD-10-CM

## 2022-03-23 DIAGNOSIS — I10 Essential (primary) hypertension: Secondary | ICD-10-CM

## 2022-03-23 DIAGNOSIS — K219 Gastro-esophageal reflux disease without esophagitis: Secondary | ICD-10-CM | POA: Diagnosis not present

## 2022-03-23 DIAGNOSIS — I25810 Atherosclerosis of coronary artery bypass graft(s) without angina pectoris: Secondary | ICD-10-CM | POA: Diagnosis not present

## 2022-03-23 MED ORDER — ALLOPURINOL 100 MG PO TABS: 50.0000 mg | ORAL_TABLET | Freq: Every morning | ORAL | 1 refills | Status: DC

## 2022-03-23 NOTE — Progress Notes (Signed)
Provider: Marlowe Sax FNP-C   Cammy Sanjurjo, Nelda Bucks, NP  Patient Care Team: Larra Crunkleton, Nelda Bucks, NP as PCP - General (Family Medicine) Cardiovascular, [facility]  Extended Emergency Contact Information Primary Emergency Contact: Rose Phi Address: [place], [place] [address] Johnnette Litter of [employer] Phone: [phone] Mobile Phone: [phone] Relation: Son Secondary Emergency Contact: cole,sue Address: [address]          [place], [place] [address] Johnnette Litter of [employer] Phone: [phone] Relation: Friend  Code Status:  Full Code  Goals of care: Advanced Directive information    01/18/2022   10:48 AM  Advanced Directives  Does Patient Have a Medical Advance Directive? Yes  Type of Paramedic of [employer];Living will;Out of facility DNR (pink MOST or yellow form)  Does patient want to make changes to medical advance directive? No - Patient declined  Copy of [place] in Chart? Yes - validated most recent copy scanned in chart (See row information)     Chief Complaint  Patient presents with   Follow-up    Six month follow-up   Medical Management of Chronic Issues    HPI:  Pt is a 82 y.o. male seen today for 6 months follow up for medical management of chronic diseases.   B/p at home running in the 110/70- 130's/80's with x 1 readings in the 140's. He denies any headache,dizziness,vision changes,fatigue,chest tightness,palpitation,chest pain or shortness of breath.   States eats two meals per day.He includes veggies and fruits in his diet.Had issues with hemorrhoids before but resolved when he changed his diet. He states would like to get off his cholesterol medication since he has changed his diet and does exercise.  Also exercises in the Gym for one hour three times per week.Plays golf and rides his Bike. His weight is stable.  He would like to get off gout medication .states no gout flare  up for several years.Does not eat red meat. States has cut down on drinking alcohol.Keeps it away from his apartment and just drinks socially.    Immunization up to date except due for COVID-19 vaccine.Aware to get vaccine at the pharmacy.    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    HFrEF (heart failure with reduced ejection fraction) (HCC)    Hypertension    Moderate mitral regurgitation    Severe aortic stenosis    Past Surgical History:  Procedure Laterality Date   AORTIC VALVE REPLACEMENT N/A 07/24/2017   Procedure: AORTIC VALVE REPLACEMENT (AVR) using 16m Inspiris Aortic Valve;  Surgeon: BGaye Pollack MD;  Location: [facility];  Service: Open Heart Surgery;  Laterality: N/A;   CLIPPING OF ATRIAL APPENDAGE N/A 07/24/2017   Procedure: CLIPPING OF ATRIAL APPENDAGE using a 527mAtricure clip;  Surgeon: BaGaye PollackMD;  [facility] Service: Open Heart Surgery;  Laterality: N/A;   CORONARY ARTERY BYPASS GRAFT N/A 07/24/2017   Procedure: CORONARY ARTERY BYPASS GRAFTING (CABG) times 3 using the left greater saphenous vein harvested endoscopically and left internal mammary artery.;  Surgeon: BaGaye PollackMD;  [facility];  Service: Open Heart Surgery;  Laterality: N/A;   EYE SURGERY Bilateral    lens replacements for  cataracts   HERNIA REPAIR     INGUINAL HERNIA REPAIR Right 05/06/2015   Procedure: LAPAROSCOPIC RIGHT INGUINAL HERNIA WITH MESH;  Surgeon: Coralie Keens, MD;  Location: [facility];  Service: General;  Laterality: Right;   INSERTION OF MESH Right 05/06/2015   Procedure: INSERTION OF MESH;  Surgeon: Coralie Keens, MD;  Location: [facility];  Service: General;  Laterality: Right;   RIGHT/LEFT HEART CATH AND CORONARY ANGIOGRAPHY N/A 07/10/2017   Procedure: RIGHT/LEFT HEART CATH AND CORONARY ANGIOGRAPHY;  Surgeon: Nigel Mormon, MD;  Location: [place] [facility];  Service: Cardiovascular;  Laterality: N/A;   TEE WITHOUT CARDIOVERSION N/A 07/11/2017   Procedure: TRANSESOPHAGEAL ECHOCARDIOGRAM (TEE);  Surgeon: Nigel Mormon, MD;  Location: [facility] ENDOSCOPY;  Service: Cardiovascular;  Laterality: N/A;   TEE WITHOUT CARDIOVERSION N/A 07/24/2017   Procedure: TRANSESOPHAGEAL ECHOCARDIOGRAM (TEE);  Surgeon: Gaye Pollack, MD;  Location: [place];  Service: Open Heart Surgery;  Laterality: N/A;   TOTAL KNEE ARTHROPLASTY Right 08/07/2019   Procedure: TOTAL KNEE ARTHROPLASTY;  Surgeon: Gaynelle Arabian, MD;  Location: [facility];  Service: Orthopedics;  Laterality: Right;   ULTRASOUND GUIDANCE FOR VASCULAR ACCESS  07/10/2017   Procedure: Ultrasound Guidance For Vascular Access;  Surgeon: Nigel Mormon, MD;  Location: [place] [facility];  Service: Cardiovascular;;    Allergies  Allergen Reactions   Sulfamethoxazole-Trimethoprim Rash    Allergies as of 03/23/2022       Reactions   Sulfamethoxazole-trimethoprim Rash        Medication List        Accurate as of March 23, 2022  9:57 AM. If you have any questions, ask your nurse or doctor.          acetaminophen 500 MG tablet Commonly known as: TYLENOL Take 2 tablets (1,000 mg total) by mouth every 6 (six) hours as needed for mild pain or fever.   allopurinol 100 MG tablet Commonly known as: ZYLOPRIM Take 100 mg by mouth every morning.   amLODipine 5 MG tablet Commonly known as: NORVASC Take 5 mg by mouth daily.   aspirin EC 81 MG tablet Take 81 mg by mouth daily. Swallow whole.   atorvastatin 20 MG tablet Commonly known as: LIPITOR Take 1 tablet (20 mg total) by mouth daily at 6 PM.   Fish Oil 1000 MG Caps Take 2 capsules by mouth daily.   loratadine 10 MG tablet Commonly known as: CLARITIN Take 10 mg by mouth as needed for allergies, rhinitis or itching.   MULTIVITAMIN ADULT PO Take 1 tablet by mouth daily.   PREPARATION H RE Place  rectally as needed.   psyllium 58.6 % powder Commonly known as: METAMUCIL Take 1 packet by mouth daily as needed (regularity (3-4 times a week)).   silodosin 8 MG Caps capsule Commonly known as: RAPAFLO Take 8 mg by mouth daily.   terbinafine 250 MG tablet Commonly known as: LAMISIL TAKE 1 TABLET BY MOUTH EVERY DAY        Review of Systems  Constitutional:  Negative for appetite change, chills, fatigue, fever and unexpected weight change.  HENT:  Positive for hearing loss. Negative for congestion, dental problem, ear discharge, ear pain, facial swelling, nosebleeds, postnasal drip, rhinorrhea, sinus pressure, sinus pain, sneezing, sore throat, tinnitus and trouble swallowing.   Eyes:  Negative for pain, discharge, redness, itching and visual disturbance.  Respiratory:  Negative for cough, chest tightness, shortness of breath and wheezing.   Cardiovascular:  Negative for chest pain, palpitations and  leg swelling.  Gastrointestinal:  Negative for abdominal distention, abdominal pain, blood in stool, constipation, diarrhea, nausea and vomiting.  Endocrine: Negative for cold intolerance, heat intolerance, polydipsia, polyphagia and polyuria.  Genitourinary:  Negative for difficulty urinating, dysuria, flank pain, frequency and urgency.  Musculoskeletal:  Negative for arthralgias, back pain, gait problem, joint swelling, myalgias, neck pain and neck stiffness.  Skin:  Negative for color change, pallor, rash and wound.  Neurological:  Negative for dizziness, syncope, speech difficulty, weakness, light-headedness, numbness and headaches.  Hematological:  Does not bruise/bleed easily.  Psychiatric/Behavioral:  Negative for agitation, behavioral problems, confusion, hallucinations, self-injury, sleep disturbance and suicidal ideas. The patient is not nervous/anxious.     Immunization History  Administered Date(s) Administered   Fluad Quad(high Dose 65+) 01/18/2022   Influenza, High Dose  Seasonal PF 11/24/2018   Influenza-Unspecified 11/24/2018   Moderna Sars-Covid-2 Vaccination 03/15/2019, 03/15/2019, 04/12/2019, 04/12/2019   PNEUMOCOCCAL CONJUGATE-20 01/18/2022   Tdap 02/11/2013, 01/18/2019   Zoster Recombinat (Shingrix) 02/26/2019, 05/20/2019   Pertinent  Health Maintenance Due  Topic Date Due   INFLUENZA VACCINE  Completed      09/20/2021    9:55 AM 11/03/2021   11:26 AM 12/01/2021   11:34 AM 01/18/2022   10:48 AM 03/23/2022    9:48 AM  Fall Risk  Falls in the past year? 0 0  0 0  Was there an injury with Fall? 0 0  0 0  Fall Risk Category Calculator 0 0  0 0  Fall Risk Category (Retired) Low Low  Low   (RETIRED) Patient Fall Risk Level Low fall risk Low fall risk Low fall risk Low fall risk   Patient at Risk for Falls Due to No Fall Risks No Fall Risks  No Fall Risks No Fall Risks  Fall risk Follow up Falls evaluation completed Falls evaluation completed  Falls evaluation completed Falls evaluation completed   Functional Status Survey:    Vitals:   03/23/22 0950  BP: 119/88  Pulse: 75  Resp: 18  Temp: (!) 97.2 F (36.2 C)  SpO2: 98%  Weight: 167 lb 8 oz (76 kg)  Height: 5' 11"$  (1.803 m)   Body mass index is 23.36 kg/m. Physical Exam Vitals reviewed.  Constitutional:      General: He is not in acute distress.    Appearance: Normal appearance. He is normal weight. He is not ill-appearing or diaphoretic.  HENT:     Head: Normocephalic.     Right Ear: Tympanic membrane, ear canal and external ear normal. There is no impacted cerumen.     Left Ear: Tympanic membrane, ear canal and external ear normal. There is no impacted cerumen.     Nose: Nose normal. No congestion or rhinorrhea.     Mouth/Throat:     Mouth: Mucous membranes are moist.     Pharynx: Oropharynx is clear. No oropharyngeal exudate or posterior oropharyngeal erythema.  Eyes:     General: No scleral icterus.       Right eye: No discharge.        Left eye: No discharge.      Extraocular Movements: Extraocular movements intact.     Conjunctiva/sclera: Conjunctivae normal.     Pupils: Pupils are equal, round, and reactive to light.  Neck:     Vascular: No carotid bruit.  Cardiovascular:     Rate and Rhythm: Normal rate and regular rhythm.     Pulses: Normal pulses.     Heart sounds: Normal heart sounds. No  murmur heard.    No friction rub. No gallop.  Pulmonary:     Effort: Pulmonary effort is normal. No respiratory distress.     Breath sounds: Normal breath sounds. No wheezing, rhonchi or rales.  Chest:     Chest wall: No tenderness.  Abdominal:     General: Bowel sounds are normal. There is no distension.     Palpations: Abdomen is soft. There is no mass.     Tenderness: There is no abdominal tenderness. There is no right CVA tenderness, left CVA tenderness, guarding or rebound.  Musculoskeletal:        General: No swelling or tenderness. Normal range of motion.     Cervical back: Normal range of motion. No rigidity or tenderness.     Right lower leg: No edema.     Left lower leg: No edema.  Lymphadenopathy:     Cervical: No cervical adenopathy.  Skin:    General: Skin is warm and dry.     Coloration: Skin is not pale.     Findings: No bruising, erythema, lesion or rash.  Neurological:     Mental Status: He is alert and oriented to person, place, and time.     Cranial Nerves: No cranial nerve deficit.     Sensory: No sensory deficit.     Motor: No weakness.     Coordination: Coordination normal.     Gait: Gait normal.  Psychiatric:        Mood and Affect: Mood normal.        Speech: Speech normal.        Behavior: Behavior normal.        Thought Content: Thought content normal.        Judgment: Judgment normal.    Labs reviewed: Recent Labs    09/20/21 1048 11/03/21 1141 11/16/21 1134  NA 138 139 136  K 5.3 5.5* 4.7  CL 103 104 102  CO2 26 26 27  $ GLUCOSE 96 97 87  BUN 13 15 11  $ CREATININE 1.00 0.99 0.99  CALCIUM 9.7 10.0 9.3    Recent Labs    09/20/21 1048 11/03/21 1141  AST 14 19  ALT 11 15  BILITOT 0.6 0.5  PROT 6.9 7.2   Recent Labs    09/20/21 1048 01/18/22 1148  WBC 3.6* 5.1  NEUTROABS 2,466 3,647  HGB 12.8* 12.9*  HCT 37.2* 38.0*  MCV 96.4 96.0  PLT 160 176   Lab Results  Component Value Date   TSH 1.63 09/20/2021   Lab Results  Component Value Date   HGBA1C 5.7 (H) 07/20/2017   Lab Results  Component Value Date   CHOL 162 09/20/2021   HDL 61 09/20/2021   LDLCALC 83 09/20/2021   TRIG 88 09/20/2021   CHOLHDL 2.7 09/20/2021    Significant Diagnostic Results in last 30 days:  No results found.  Assessment/Plan 1. Essential hypertension B/p well controlled  - continue on Atorvastatin and ASA for cardiovascular event prevention - CBC with Differential/Platelet - COMPLETE METABOLIC PANEL WITH GFR - TSH  2. Mixed hyperlipidemia LDL at goal  - continue on atorvastatin.would like to get off statin.will need to discuss with his Cardiology - Lipid Panel  3. Coronary artery disease involving coronary bypass graft of native heart without angina pectoris Chest pain free  - continue on Statin and ASA - continue dietary modification and exercise   4. Idiopathic gout, unspecified chronicity, unspecified site No flare up  Would like to get off  Allopurinol  Will decrease Allopurinol from 100 mg to 50 mg tablet daily x 1 week then every other day x 1 week then stop. - advised to notify provider if symptoms recurs. - allopurinol (ZYLOPRIM) 100 MG tablet; Take 0.5 tablets (50 mg total) by mouth every morning.  Dispense: 30 tablet; Refill: 1  5. Gastroesophageal reflux disease without esophagitis Symptoms resolve with change of diet.Has not required any medication.  Family/ staff Communication: Reviewed plan of care with patient verbalized understanding   Labs/tests ordered:  - CBC with Differential/Platelet - COMPLETE METABOLIC PANEL WITH GFR - TSH - Lipid Panel  Next  Appointment : Return in about 6 months (around 09/21/2022) for medical mangement of chronic issues.Sandrea Hughs, NP

## 2022-03-24 LAB — COMPLETE METABOLIC PANEL WITH GFR
AG Ratio: 1.9 (calc) (ref 1.0–2.5)
ALT: 19 U/L (ref 9–46)
AST: 23 U/L (ref 10–35)
Albumin: 4.5 g/dL (ref 3.6–5.1)
Alkaline phosphatase (APISO): 55 U/L (ref 35–144)
BUN: 11 mg/dL (ref 7–25)
CO2: 26 mmol/L (ref 20–32)
Calcium: 10.1 mg/dL (ref 8.6–10.3)
Chloride: 101 mmol/L (ref 98–110)
Creat: 0.99 mg/dL (ref 0.70–1.22)
Globulin: 2.4 g/dL (calc) (ref 1.9–3.7)
Glucose, Bld: 90 mg/dL (ref 65–99)
Potassium: 5.1 mmol/L (ref 3.5–5.3)
Sodium: 136 mmol/L (ref 135–146)
Total Bilirubin: 0.6 mg/dL (ref 0.2–1.2)
Total Protein: 6.9 g/dL (ref 6.1–8.1)
eGFR: 77 mL/min/{1.73_m2} (ref >=60)

## 2022-03-24 LAB — CBC WITH DIFFERENTIAL/PLATELET
Absolute Monocytes: 337 cells/uL (ref 200–950)
Basophils Absolute: 31 cells/uL (ref 0–200)
Basophils Relative: 0.9 %
Eosinophils Absolute: 88 cells/uL (ref 15–500)
Eosinophils Relative: 2.6 %
HCT: 38.3 % — ABNORMAL LOW (ref 38.5–50.0)
Hemoglobin: 12.9 g/dL — ABNORMAL LOW (ref 13.2–17.1)
Lymphs Abs: 643 cells/uL — ABNORMAL LOW (ref 850–3900)
MCH: 33.3 pg — ABNORMAL HIGH (ref 27.0–33.0)
MCHC: 33.7 g/dL (ref 32.0–36.0)
MCV: 99 fL (ref 80.0–100.0)
MPV: 10.5 fL (ref 7.5–12.5)
Monocytes Relative: 9.9 %
Neutro Abs: 2302 cells/uL (ref 1500–7800)
Neutrophils Relative %: 67.7 %
Platelets: 154 10*3/uL (ref 140–400)
RBC: 3.87 10*6/uL — ABNORMAL LOW (ref 4.20–5.80)
RDW: 11.8 % (ref 11.0–15.0)
Total Lymphocyte: 18.9 %
WBC: 3.4 10*3/uL — ABNORMAL LOW (ref 3.8–10.8)

## 2022-03-24 LAB — TSH: TSH: 1.39 mIU/L (ref 0.40–4.50)

## 2022-03-24 LAB — LIPID PANEL
Cholesterol: 173 mg/dL (ref <200)
HDL: 73 mg/dL (ref >=40)
LDL Cholesterol (Calc): 86 mg/dL (calc)
Non-HDL Cholesterol (Calc): 100 mg/dL (calc) (ref <130)
Total CHOL/HDL Ratio: 2.4 (calc) (ref <5.0)
Triglycerides: 62 mg/dL (ref <150)

## 2022-03-25 ENCOUNTER — Other Ambulatory Visit: Payer: Self-pay

## 2022-03-25 DIAGNOSIS — D649 Anemia, unspecified: Secondary | ICD-10-CM

## 2022-03-25 MED ORDER — ASCORBIC ACID 500 MG PO TABS: 500.0000 mg | ORAL_TABLET | Freq: Every day | ORAL | 11 refills | Status: DC

## 2022-04-01 ENCOUNTER — Telehealth: Payer: Self-pay | Admitting: Oncology

## 2022-04-01 NOTE — Telephone Encounter (Signed)
Attempted to contact patient in regards to referral. No answer so voicemail was left for patient to call back

## 2022-04-04 ENCOUNTER — Telehealth: Payer: Self-pay | Admitting: *Deleted

## 2022-04-04 DIAGNOSIS — B351 Tinea unguium: Secondary | ICD-10-CM

## 2022-04-04 NOTE — Telephone Encounter (Signed)
Patient called and stated that Dinah forgot to place a referral for him to go to the Podiatrist. 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   Patient Requesting for you to place a referral.

## 2022-04-04 NOTE — Telephone Encounter (Signed)
I apologize for that.will place Podiatrist referral.

## 2022-04-11 ENCOUNTER — Encounter: Payer: Self-pay | Admitting: Podiatry

## 2022-04-11 ENCOUNTER — Ambulatory Visit: Payer: Medicare PPO | Admitting: Podiatry

## 2022-04-11 DIAGNOSIS — M79674 Pain in right toe(s): Secondary | ICD-10-CM

## 2022-04-11 DIAGNOSIS — B351 Tinea unguium: Secondary | ICD-10-CM

## 2022-04-11 DIAGNOSIS — M79675 Pain in left toe(s): Secondary | ICD-10-CM | POA: Diagnosis not present

## 2022-04-11 NOTE — Progress Notes (Signed)
   Chief Complaint  Patient presents with   Nail Problem    Nail trim     SUBJECTIVE Patient presents to office today complaining of elongated, thickened nails that cause pain while ambulating in shoes.  Patient is unable to trim their own nails. Patient is here for further evaluation and treatment.  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    HFrEF (heart failure with reduced ejection fraction) (HCC)    Hypertension    Moderate mitral regurgitation    Severe aortic stenosis     Allergies  Allergen Reactions   Sulfamethoxazole-Trimethoprim Rash     OBJECTIVE General Patient is awake, alert, and oriented x 3 and in no acute distress. Derm Skin is dry and supple bilateral. Negative open lesions or macerations. Remaining integument unremarkable. Nails are tender, long, thickened and dystrophic with subungual debris, consistent with onychomycosis, 1-5 bilateral. No signs of infection noted. Vasc  DP and PT pedal pulses palpable bilaterally. Temperature gradient within normal limits.  Neuro Epicritic and protective threshold sensation grossly intact bilaterally.  Musculoskeletal Exam No symptomatic pedal deformities noted bilateral. Muscular strength within normal limits.  ASSESSMENT 1.  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 Patient evaluated today.  2. Instructed to maintain good pedal hygiene and foot care.  3. Mechanical debridement of nails 1-5 bilaterally performed using a nail nipper. Filed with dremel without incident.  4. Return to clinic as needed   Edrick Kins, DPM [facility]  Dr. Edrick Kins, DPM    [address], [place] [address]                Office [phone]  Fax [phone]

## 2022-04-12 ENCOUNTER — Other Ambulatory Visit: Payer: Self-pay

## 2022-04-12 DIAGNOSIS — B353 Tinea pedis: Secondary | ICD-10-CM

## 2022-04-27 ENCOUNTER — Telehealth: Payer: Self-pay | Admitting: Family

## 2022-04-27 ENCOUNTER — Encounter: Payer: Self-pay | Admitting: Family

## 2022-04-27 NOTE — Progress Notes (Signed)
Error

## 2022-04-27 NOTE — Telephone Encounter (Signed)
Spoke with patient, he verbalized his understanding and agreed.

## 2022-04-27 NOTE — Telephone Encounter (Signed)
Blood sugar log received from continuous glucose monitor.Overall blood sugars well controlled. Continue with dietary modification and exercise.Moose ice cream and Beer tends makes blood sugars slight high noted on log so cut down on Beer and ice cream.

## 2022-05-06 ENCOUNTER — Other Ambulatory Visit: Payer: Self-pay | Admitting: *Deleted

## 2022-05-06 DIAGNOSIS — D649 Anemia, unspecified: Secondary | ICD-10-CM

## 2022-05-11 ENCOUNTER — Other Ambulatory Visit: Payer: Self-pay | Admitting: Family

## 2022-05-11 DIAGNOSIS — M1000 Idiopathic gout, unspecified site: Secondary | ICD-10-CM

## 2022-05-11 NOTE — Telephone Encounter (Signed)
Noted  

## 2022-05-12 DIAGNOSIS — R351 Nocturia: Secondary | ICD-10-CM | POA: Diagnosis not present

## 2022-05-12 DIAGNOSIS — N401 Enlarged prostate with lower urinary tract symptoms: Secondary | ICD-10-CM | POA: Diagnosis not present

## 2022-05-12 DIAGNOSIS — R350 Frequency of micturition: Secondary | ICD-10-CM | POA: Diagnosis not present

## 2022-05-12 NOTE — Progress Notes (Signed)
New Hematology/Oncology Consult   Requesting MD: Richarda Blade, NP  [phone]      Reason for Consult: Anemia  HPI: [name] is an 82 year old male referred for evaluation of anemia.  [name] was seen by his PCP for 52-month follow-up on 03/23/2022.  CBC returned with hemoglobin 12.5, MCV 99, white count 3.4, ANC 2.3, absolute lymphocyte count 0.6, platelet count 154,000.  Comparison CBC from 01/18/2022-hemoglobin 12.9, MCV 96, white count 5.1, ANC 3.6, absolute lymphocyte count 1.8, platelet count 176,000; 09/20/2021 hemoglobin 12.8, white count 3.6, absolute neutrophil count 2.4, absolute lymphocyte count 0.6, platelet count 160,000; 07/30/2019 hemoglobin 12.8, MCV 97, white count 4.2, platelet count 185,000; 01/18/2019 hemoglobin 13.2, MCV 99, white count 4.3, ANC 2.7, absolute lymphocyte count 0.8, platelet count 154,000.     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   CHF (congestive heart failure)    Gout    Hepatitis 1964   hepatitis A    HFrEF (heart failure with reduced ejection fraction)    Hypertension    Moderate mitral regurgitation    Severe aortic stenosis      Past Surgical History:  Procedure Laterality Date   AORTIC VALVE REPLACEMENT N/A 07/24/2017   Procedure: AORTIC VALVE REPLACEMENT (AVR) using 34mm Inspiris Aortic Valve;  Surgeon: Alleen Borne, MD;  [facility]: [facility] OR;  Service: Open Heart Surgery;  Laterality: N/A;   CLIPPING OF ATRIAL APPENDAGE N/A 07/24/2017   Procedure: CLIPPING OF ATRIAL APPENDAGE using a 46mm Atricure clip;  Surgeon: Alleen Borne, MD;  Location: [facility] OR;  Service: Open Heart Surgery;  Laterality: N/A;   CORONARY ARTERY BYPASS GRAFT N/A 07/24/2017   Procedure: CORONARY ARTERY BYPASS GRAFTING (CABG) times 3 using the left greater saphenous vein harvested endoscopically and left internal mammary artery.;  Surgeon: Alleen Borne, MD;  [facility]: [facility] OR;  Service: Open Heart Surgery;   Laterality: N/A;   EYE SURGERY Bilateral    lens replacements for cataracts   HERNIA REPAIR     INGUINAL HERNIA REPAIR Right 05/06/2015   Procedure: LAPAROSCOPIC RIGHT INGUINAL HERNIA WITH MESH;  Surgeon: Abigail Miyamoto, MD;  Location: [facility] ORS;  Service: General;  Laterality: Right;   INSERTION OF MESH Right 05/06/2015   Procedure: INSERTION OF MESH;  Surgeon: Abigail Miyamoto, MD;  Location: [facility]S;  Service: General;  Laterality: Right;   RIGHT/LEFT HEART CATH AND CORONARY ANGIOGRAPHY N/A 07/10/2017   Procedure: RIGHT/LEFT HEART CATH AND CORONARY ANGIOGRAPHY;  Surgeon: Elder Negus, MD;  [facility] INVAS[facility] LAB;  Service: Cardiovascular;  Laterality: N/A;   TEE WITHOUT CARDIOVERSION N/A 07/11/2017   Procedure: TRANSESOPHAGEAL ECHOCARDIOGRAM (TEE);  Surgeon: Elder Negus, MD;  Location[facility] ENDOSCOPY;  Service: Cardiovascular;  Laterality: N/A;   TEE WITHOUT CARDIOVERSION N/A 07/24/2017   Procedure: TRANSESOPHAGEAL ECHOCARDIOGRAM (TEE);  Surgeon: Alleen Borne, MD;  [facility];  Service: Open Heart Surgery;  Laterality: N/A;   TOTAL KNEE ARTHROPLASTY Right 08/07/2019   Procedure: TOTAL KNEE ARTHROPLASTY;  Surgeon: Ollen Gross, MD;  [facility] ORS;  Service: Orthopedics;  Laterality: Right;   ULTRASOUND GUIDANCE FOR VASCULAR ACCESS  07/10/2017   Procedure: Ultrasound Guidance For Vascular Access;  Surgeon: Elder Negus, MD;  [facility] INVAS[facility] LAB;  Service: Cardiovascular;;     Current Outpatient Medications:    acetaminophen (TYLENOL) 500 MG tablet, Take 2 tablets (1,000 mg total) by mouth every 6 (six)  hours as needed for mild pain or fever., Disp: 30 tablet, Rfl: 0   amLODipine (NORVASC) 5 MG tablet, Take 5 mg by mouth daily., Disp: , Rfl:    ascorbic acid (VITAMIN C) 500 MG tablet, Take 1 tablet (500 mg total) by mouth daily., Disp: 30 tablet, Rfl: 11   aspirin EC 81 MG tablet, Take 81 mg by mouth daily. Swallow whole., Disp: , Rfl:    Cetirizine  HCl (ZYRTEC ALLERGY) 10 MG CAPS, , Disp: , Rfl:    Multiple Vitamin (MULTIVITAMIN ADULT PO), Take 1 tablet by mouth daily., Disp: , Rfl:    Omega-3 Fatty Acids (FISH OIL) 1000 MG CAPS, Take 2 capsules by mouth daily., Disp: , Rfl:    Phenylephrine HCl (PREPARATION H RE), Place rectally as needed., Disp: , Rfl:    psyllium (METAMUCIL) 58.6 % powder, Take 1 packet by mouth daily as needed (regularity (3-4 times a week)). , Disp: , Rfl:    silodosin (RAPAFLO) 8 MG CAPS capsule, Take 8 mg by mouth daily., Disp: , Rfl: :     Allergies  Allergen Reactions   Sulfamethoxazole-Trimethoprim Rash    FH: Mother deceased possible stroke.  Father deceased MI.  No family history of a blood disorder.  No family history of cancer.  SOCIAL HISTORY: [name] lives in [place] with his wife.  She is under hospice care.  [name] has a 15 year old son reported to be in good health.  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  [name] quit smoking in 1973 or 1974.  For the past year very little alcohol intake.  Prior to that 1-2 beers a day, 1/5 of whiskey every 2 weeks.  Reports a history of hepatitis.  Review of Systems: No fevers or sweats.  [name] has a good appetite.  No weight loss.  [name] denies pain.  No bleeding.  No infections.  No joint inflammation.  No rash.  [name] recently had a small cut on his right leg that took longer to heal than [name] thought it should have.  No dysphagia.  No cough or shortness of breath.  No chest pain.  No change in bowel habits.  No urinary symptoms.  No numbness or tingling in the hands or feet.  Physical Exam:  Blood pressure (!) 146/73, pulse 90, temperature 98.2 F (36.8 C), temperature source Oral, resp. rate 18, height 5\' 11"  (1.803 m), weight 166 lb (75.3 kg), SpO2 99 %.  HEENT: No thrush or ulcers. Lungs: Distant breath sounds. Cardiac: Regular rate and rhythm. Abdomen: No hepatosplenomegaly. Vascular: No leg edema. Lymph nodes: No palpable cervical, supraclavicular, axillary or inguinal lymph  nodes. Neurologic: Alert and oriented. Skin: Brown discoloration at the lower legs.   LABS:   Recent Labs    05/13/22 1040  WBC 3.1*  HGB 12.2*  HCT 36.5*  PLT 155  Peripheral blood smear-few ovalocytes, polychromasia not increased; hypolobated and hypogranular neutrophils; platelets appear normal in number.  No results for input(s): "NA", "K", "CL", "CO2", "GLUCOSE", "BUN", "CREATININE", "CALCIUM" in the last 72 hours.    RADIOLOGY:  No results found.  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  The differential includes liver disease, MDS, lymphoproliferative process, B12 deficiency.  This was reviewed with him at today's visit.  We are obtaining additional laboratory evaluation to include a myeloma panel and B12 level.  We discussed the possibility of a bone marrow biopsy if counts  decline further.  [name] will return for lab and follow-up in 6 months.  We are available to see him sooner if needed.  Patient seen with Dr. Truett Perna.    Lonna Cobb, NP 05/13/2022, 11:40 AM  This was a shared visit with Lonna Cobb.  [name] was interviewed and examined.  I reviewed the peripheral blood smear.  [name] is referred for evaluation of mild anemia and leukopenia.  The platelet count is in the low normal range.  We discussed the differential diagnosis with [name].  The MCV is at the upper end of the normal range.  There are ovalocytes and hypogranular neutrophils on the peripheral blood smear. The cytopenias may be related to chronic liver disease from alcohol use or myelodysplasia.  We will proceed with a diagnostic bone marrow biopsy if [name] develops progressive cytopenias.  I was present greater than 50% of today's visit.  I performed medical stage making.  Mancel Bale, MD

## 2022-05-13 ENCOUNTER — Encounter: Payer: Self-pay | Admitting: Nurse Practitioner

## 2022-05-13 ENCOUNTER — Inpatient Hospital Stay: Payer: Medicare PPO | Attending: Oncology

## 2022-05-13 ENCOUNTER — Inpatient Hospital Stay: Payer: Medicare PPO | Admitting: Nurse Practitioner

## 2022-05-13 ENCOUNTER — Other Ambulatory Visit: Payer: Self-pay

## 2022-05-13 VITALS — BP 146/73 | HR 90 | Temp 98.2°F | Resp 18 | Ht 71.0 in | Wt 166.0 lb

## 2022-05-13 DIAGNOSIS — I10 Essential (primary) hypertension: Secondary | ICD-10-CM

## 2022-05-13 DIAGNOSIS — Z87891 Personal history of nicotine dependence: Secondary | ICD-10-CM | POA: Diagnosis not present

## 2022-05-13 DIAGNOSIS — K709 Alcoholic liver disease, unspecified: Secondary | ICD-10-CM | POA: Diagnosis not present

## 2022-05-13 DIAGNOSIS — I2510 Atherosclerotic heart disease of native coronary artery without angina pectoris: Secondary | ICD-10-CM

## 2022-05-13 DIAGNOSIS — D72819 Decreased white blood cell count, unspecified: Secondary | ICD-10-CM | POA: Diagnosis not present

## 2022-05-13 DIAGNOSIS — D61818 Other pancytopenia: Secondary | ICD-10-CM | POA: Insufficient documentation

## 2022-05-13 DIAGNOSIS — F1091 Alcohol use, unspecified, in remission: Secondary | ICD-10-CM

## 2022-05-13 DIAGNOSIS — D649 Anemia, unspecified: Secondary | ICD-10-CM

## 2022-05-13 LAB — CBC WITH DIFFERENTIAL (CANCER CENTER ONLY)
Abs Immature Granulocytes: 0.01 10*3/uL (ref 0.00–0.07)
Basophils Absolute: 0.1 10*3/uL (ref 0.0–0.1)
Basophils Relative: 2 %
Eosinophils Absolute: 0.1 10*3/uL (ref 0.0–0.5)
Eosinophils Relative: 5 %
HCT: 36.5 % — ABNORMAL LOW (ref 39.0–52.0)
Hemoglobin: 12.2 g/dL — ABNORMAL LOW (ref 13.0–17.0)
Immature Granulocytes: 0 %
Lymphocytes Relative: 21 %
Lymphs Abs: 0.7 10*3/uL (ref 0.7–4.0)
MCH: 32.8 pg (ref 26.0–34.0)
MCHC: 33.4 g/dL (ref 30.0–36.0)
MCV: 98.1 fL (ref 80.0–100.0)
Monocytes Absolute: 0.4 10*3/uL (ref 0.1–1.0)
Monocytes Relative: 14 %
Neutro Abs: 1.8 10*3/uL (ref 1.7–7.7)
Neutrophils Relative %: 58 %
Platelet Count: 155 10*3/uL (ref 150–400)
RBC: 3.72 MIL/uL — ABNORMAL LOW (ref 4.22–5.81)
RDW: 12.3 % (ref 11.5–15.5)
WBC Count: 3.1 10*3/uL — ABNORMAL LOW (ref 4.0–10.5)
nRBC: 0 % (ref 0.0–0.2)

## 2022-05-13 LAB — SAVE SMEAR(SSMR), FOR PROVIDER SLIDE REVIEW

## 2022-05-13 LAB — VITAMIN B12: Vitamin B-12: 159 pg/mL — ABNORMAL LOW (ref 180–914)

## 2022-05-18 ENCOUNTER — Telehealth: Payer: Self-pay

## 2022-05-18 LAB — MULTIPLE MYELOMA PANEL, SERUM
Albumin SerPl Elph-Mcnc: 4.1 g/dL (ref 2.9–4.4)
Albumin/Glob SerPl: 1.4 (ref 0.7–1.7)
Alpha 1: 0.3 g/dL (ref 0.0–0.4)
Alpha2 Glob SerPl Elph-Mcnc: 0.8 g/dL (ref 0.4–1.0)
B-Globulin SerPl Elph-Mcnc: 0.9 g/dL (ref 0.7–1.3)
Gamma Glob SerPl Elph-Mcnc: 1 g/dL (ref 0.4–1.8)
Globulin, Total: 3 g/dL (ref 2.2–3.9)
IgA: 173 mg/dL (ref 61–437)
IgG (Immunoglobin G), Serum: 1029 mg/dL (ref 603–1613)
IgM (Immunoglobulin M), Srm: 51 mg/dL (ref 15–143)
Total Protein ELP: 7.1 g/dL (ref 6.0–8.5)

## 2022-05-18 NOTE — Telephone Encounter (Signed)
Patient gave verbal understanding and had no further questions or concerns  

## 2022-05-18 NOTE — Telephone Encounter (Signed)
-----   Message from Rana Snare, NP sent at 05/18/2022  8:18 AM EDT ----- Please let him know the B12 level returned low.  He should begin B12 1000 mcg daily.  Follow-up as scheduled.

## 2022-05-19 ENCOUNTER — Encounter: Payer: Self-pay | Admitting: Nurse Practitioner

## 2022-05-25 ENCOUNTER — Encounter: Payer: Medicare PPO | Admitting: Family

## 2022-06-03 ENCOUNTER — Ambulatory Visit (INDEPENDENT_AMBULATORY_CARE_PROVIDER_SITE_OTHER): Payer: Medicare PPO | Admitting: Family

## 2022-06-03 ENCOUNTER — Encounter: Payer: Self-pay | Admitting: Family

## 2022-06-03 VITALS — BP 124/82 | HR 62 | Temp 97.6°F | Resp 16 | Ht 71.65 in | Wt 166.8 lb

## 2022-06-03 DIAGNOSIS — Z0000 Encounter for general adult medical examination without abnormal findings: Secondary | ICD-10-CM | POA: Diagnosis not present

## 2022-06-03 NOTE — Progress Notes (Signed)
Subjective:   [name] is a 82 y.o. male who presents for Medicare Annual/Subsequent preventive examination.  Review of Systems     Cardiac Risk Factors include: advanced age (>64men, >40 women);male gender;family history of premature cardiovascular disease;smoking/ tobacco exposure     Objective:    Today's Vitals   06/03/22 1005 06/03/22 1023  BP: 138/82   Pulse: 62   Resp: 16   Temp: 97.6 F (36.4 C)   TempSrc: Temporal   SpO2: 99%   Weight: 166 lb 12.8 oz (75.7 kg)   Height: 5' 11.65" (1.82 m)   PainSc: 0-No pain 0-No pain   Body mass index is 22.84 kg/m.     06/03/2022   10:07 AM 05/13/2022   10:59 AM 01/18/2022   10:48 AM 11/03/2021   11:26 AM 09/20/2021    9:45 AM 08/07/2019    2:00 PM 07/30/2019    9:28 AM  Advanced Directives  Does Patient Have a Medical Advance Directive? Yes Yes Yes Yes Yes Yes Yes  Type of Estate agent of [place];Living will Living will;Healthcare Power of [employer] Power of [place];Living will;Out of facility DNR (pink MOST or yellow form) Healthcare Power of [place];Living will Healthcare Power of [place];Living will Healthcare Power of [place];Living will Healthcare Power of [place];Living will  Does patient want to make changes to medical advance directive? No - Patient declined No - Patient declined No - Patient declined No - Patient declined No - Patient declined No - Patient declined   Copy of Healthcare Power of Attorney in Chart? Yes - validated most recent copy scanned in chart (See row information)  Yes - validated most recent copy scanned in chart (See row information) Yes - validated most recent copy scanned in chart (See row information) Yes - validated most recent copy scanned in chart (See row information) No - copy requested     Current Medications (verified) Outpatient Encounter Medications as of 06/03/2022  Medication Sig   acetaminophen (TYLENOL) 500 MG tablet Take 2 tablets (1,000 mg  total) by mouth every 6 (six) hours as needed for mild pain or fever.   amLODipine (NORVASC) 5 MG tablet Take 5 mg by mouth daily.   ascorbic acid (VITAMIN C) 500 MG tablet Take 1 tablet (500 mg total) by mouth daily.   aspirin EC 81 MG tablet Take 81 mg by mouth daily. Swallow whole.   Cetirizine HCl (ZYRTEC ALLERGY) 10 MG CAPS    Multiple Vitamin (MULTIVITAMIN ADULT PO) Take 1 tablet by mouth daily.   Omega-3 Fatty Acids (FISH OIL) 1000 MG CAPS Take 2 capsules by mouth daily.   Phenylephrine HCl (PREPARATION H RE) Place rectally as needed.   psyllium (METAMUCIL) 58.6 % powder Take 1 packet by mouth daily as needed (regularity (3-4 times a week)).    silodosin (RAPAFLO) 8 MG CAPS capsule Take 8 mg by mouth daily.   No facility-administered encounter medications on file as of 06/03/2022.    Allergies (verified) Sulfamethoxazole-trimethoprim   History: 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    HFrEF (heart failure with reduced ejection fraction) (HCC)    Hypertension    Moderate mitral regurgitation    Severe aortic stenosis    Past Surgical History:  Procedure Laterality Date   AORTIC VALVE  REPLACEMENT N/A 07/24/2017   Procedure: AORTIC VALVE REPLACEMENT (AVR) using 23mm Inspiris Aortic Valve;  Surgeon: Alleen Borne, MD;  [facility] OR;  Service: Open Heart Surgery;  Laterality: N/A;   CLIPPING OF ATRIAL APPENDAGE N/A 07/24/2017   Procedure: CLIPPING OF ATRIAL APPENDAGE using a 50mm Atricure clip;  Surgeon: Alleen Borne, MD;  [facility] OR;  Service: Open Heart Surgery;  Laterality: N/A;   CORONARY ARTERY BYPASS GRAFT N/A 07/24/2017   Procedure: CORONARY ARTERY BYPASS GRAFTING (CABG) times 3 using the left greater saphenous vein harvested endoscopically and left internal mammary artery.;  Surgeon: Alleen Borne, MD;  [facility]  OR;  Service: Open Heart Surgery;  Laterality: N/A;   EYE SURGERY Bilateral    lens replacements for cataracts   HERNIA REPAIR     INGUINAL HERNIA REPAIR Right 05/06/2015   Procedure: LAPAROSCOPIC RIGHT INGUINAL HERNIA WITH MESH;  Surgeon: Abigail Miyamoto, MD;  [facility]: [facility] ORS;  Service: General;  Laterality: Right;   INSERTION OF MESH Right 05/06/2015   Procedure: INSERTION OF MESH;  Surgeon: Abigail Miyamoto, MD;  [facility]: [facility] ORS;  Service: General;  Laterality: Right;   RIGHT/LEFT HEART CATH AND CORONARY ANGIOGRAPHY N/A 07/10/2017   Procedure: RIGHT/LEFT HEART CATH AND CORONARY ANGIOGRAPHY;  Surgeon: Elder Negus, MD;  [facility] INVASIVE CV LAB;  Service: Cardiovascular;  Laterality: N/A;   TEE WITHOUT CARDIOVERSION N/A 07/11/2017   Procedure: TRANSESOPHAGEAL ECHOCARDIOGRAM (TEE);  Surgeon: Elder Negus, MD;  Location: [facility] ENDOSCOPY;  Service: Cardiovascular;  Laterality: N/A;   TEE WITHOUT CARDIOVERSION N/A 07/24/2017   Procedure: TRANSESOPHAGEAL ECHOCARDIOGRAM (TEE);  Surgeon: Alleen Borne, MD;  Location: [facility] OR;  Service: Open Heart Surgery;  Laterality: N/A;   TOTAL KNEE ARTHROPLASTY Right 08/07/2019   Procedure: TOTAL KNEE ARTHROPLASTY;  Surgeon: Ollen Gross, MD;  [facility]: [facility] ORS;  Service: Orthopedics;  Laterality: Right;   ULTRASOUND GUIDANCE FOR VASCULAR ACCESS  07/10/2017   Procedure: Ultrasound Guidance For Vascular Access;  Surgeon: Elder Negus, MD;  [facility] INVAS[facility] CV LAB;  Service: Cardiovascular;;   Family History  Problem Relation Age of Onset   Stroke Mother    Heart attack Father    Social History   Socioeconomic History   Marital status: Married    Spouse name: Not on file   Number of children: 1   Years of education: Not on file   Highest education level: Not on file  Occupational History   Not on file  Tobacco Use   Smoking status: Former    Packs/day: 1.00    Years: 12.00    Additional pack years: 0.00    Total pack years:  12.00    Types: Cigarettes    Quit date: 65    Years since quitting: 51.3   Smokeless tobacco: Never   Tobacco comments:    quit 30-40 yrs ago  Vaping Use   Vaping Use: Never used  Substance and Sexual Activity   Alcohol use: Yes    Comment: 1-2 drinks   Drug use: Yes    Types: Marijuana   Sexual activity: Not on file  Other Topics Concern   Not on file  Social History Narrative   Not on file   Social Determinants of Health   Financial Resource Strain: Low Risk  (05/13/2022)   Overall Financial Resource Strain (CARDIA)    Difficulty of Paying Living Expenses: Not hard at all  Food Insecurity: No Food Insecurity (05/13/2022)   Hunger Vital Sign  Worried About Programme researcher, broadcasting/film/video in the Last Year: Never true    Ran Out of Food in the Last Year: Never true  Transportation Needs: No Transportation Needs (05/13/2022)   PRAPARE - Administrator, Civil Service (Medical): No    Lack of Transportation (Non-Medical): No  Physical Activity: Sufficiently Active (05/13/2022)   Exercise Vital Sign    Days of Exercise per Week: 7 days    Minutes of Exercise per Session: 60 min  Stress: No Stress Concern Present (05/13/2022)   [employer] of Occupational Health - Occupational Stress Questionnaire    Feeling of Stress : Not at all  Social Connections: Socially Integrated (05/13/2022)   Social Connection and Isolation Panel [NHANES]    Frequency of Communication with Friends and Family: More than three times a week    Frequency of Social Gatherings with Friends and Family: More than three times a week    Attends Religious Services: More than 4 times per year    Active Member of [employer] or Organizations: Yes    Attends Engineer, structural: More than 4 times per year    Marital Status: Married    Tobacco Counseling Counseling given: Not Answered Tobacco comments: quit 30-40 yrs ago   Clinical Intake:  Pre-visit preparation completed: No  Pain Score: 0-No  pain     BMI - recorded: 22.84 Nutritional Status: BMI of 19-24  Normal Nutritional Risks: None Diabetes: No  How often do you need to have someone help you when you read instructions, pamphlets, or other written materials from your doctor or pharmacy?: 1 - Never What is the last grade level you completed in school?: Master's Degree  Diabetic?No   Interpreter Needed?: No      Activities of Daily Living    06/03/2022   10:29 AM 06/03/2022   10:07 AM  In your present state of health, do you have any difficulty performing the following activities:  Hearing? 1 0  Comment wears hearing aids   Vision? 0 0  Difficulty concentrating or making decisions? 0 0  Walking or climbing stairs? 0 0  Dressing or bathing? 0 0  Doing errands, shopping? 0 0  Preparing Food and eating ? N N  Using the Toilet? N N  In the past six months, have you accidently leaked urine? N N  Do you have problems with loss of bowel control? N N  Managing your Medications? N N  Managing your Finances? N N  Housekeeping or managing your Housekeeping? [place]  Comment has Programmer, applications     Patient Care Team: Anahy Esh, Donalee Citrin, NP as PCP - General (Family Medicine) Cardiovascular, [place]  Indicate any recent Medical Services you may have received from other than [facility] providers in the past year (date may be approximate).     Assessment:   This is a routine wellness examination for [name].  Hearing/Vision screen No results found.  Dietary issues and exercise activities discussed: Current Exercise Habits: Home exercise routine, Type of exercise: strength training/weights (rides Bike), Time (Minutes): > 60, Frequency (Times/Week): 5, Weekly Exercise (Minutes/Week): 0, Intensity: Intense, Exercise limited by: None identified   Goals Addressed             This Visit's Progress    Patient Stated       Stay active and do own ADL's for the next 10 years        Depression Screen    06/03/2022   10:07  AM 05/13/2022   11:02 AM 03/23/2022    9:48 AM 09/20/2021   10:06 AM 02/18/2015    2:45 PM 02/10/2015   10:35 AM  PHQ 2/9 Scores  PHQ - 2 Score 0 0 0 0 0 0    Fall Risk    06/03/2022   10:07 AM 03/23/2022    9:48 AM 01/18/2022   10:48 AM 11/03/2021   11:26 AM 09/20/2021    9:55 AM  Fall Risk   Falls in the past year? 0 0 0 0 0  Number falls in past yr: 0 0 0 0 0  Injury with Fall? 0 0 0 0 0  Risk for fall due to : History of fall(s);No Fall Risks No Fall Risks No Fall Risks No Fall Risks No Fall Risks  Follow up  Falls evaluation completed Falls evaluation completed Falls evaluation completed Falls evaluation completed    FALL RISK PREVENTION PERTAINING TO THE HOME:  Any stairs in or around the home? No  If so, are there any without handrails? No  Home free of loose throw rugs in walkways, pet beds, electrical cords, etc? No  Adequate lighting in your home to reduce risk of falls? Yes   ASSISTIVE DEVICES UTILIZED TO PREVENT FALLS:  Life alert? No  Use of a cane, walker or w/c? No  Grab bars in the bathroom? Yes  Shower chair or bench in shower? No Has shower Bench that belonged to the wife  Elevated toilet seat or a handicapped toilet? Yes   TIMED UP AND GO:  Was the test performed? Yes .  Length of time to ambulate 10 feet: 8  sec.   Gait slow and steady without use of assistive device  Cognitive Function:    06/03/2022   10:08 AM  MMSE - Mini Mental State Exam  Orientation to time 5  Orientation to Place 5  Registration 3  Attention/ Calculation 5  Recall 2  Language- name 2 objects 2  Language- repeat 1  Language- follow 3 step command 3  Language- read & follow direction 1  Write a sentence 1  Copy design 1  Total score 29        Immunizations Immunization History  Administered Date(s) Administered   Fluad Quad(high Dose 65+) 01/18/2022   Influenza, High Dose Seasonal PF 11/24/2018   Influenza-Unspecified 11/24/2018   Moderna Sars-Covid-2 Vaccination  03/15/2019, 03/15/2019, 04/12/2019, 04/12/2019   PNEUMOCOCCAL CONJUGATE-20 01/18/2022   Tdap 02/11/2013, 01/18/2019   Zoster Recombinat (Shingrix) 02/26/2019, 05/20/2019    TDAP status: Up to date  Flu Vaccine status: Up to date  Pneumococcal vaccine status: Up to date  Covid-19 vaccine status: Information provided on how to obtain vaccines.   Qualifies for Shingles Vaccine? Yes   Zostavax completed Yes   Shingrix Completed?: Yes  Screening Tests Health Maintenance  Topic Date Due   COVID-19 Vaccine (5 - 2023-24 season) 06/19/2022 (Originally 10/08/2021)   INFLUENZA VACCINE  09/08/2022   Medicare Annual Wellness (AWV)  06/03/2023   DTaP/Tdap/Td (3 - Td or Tdap) 01/17/2029   Pneumonia Vaccine 28+ Years old  Completed   Zoster Vaccines- Shingrix  Completed   HPV VACCINES  Aged Out    Health Maintenance  There are no preventive care reminders to display for this patient.   Colorectal cancer screening: No longer required.   Lung Cancer Screening: (Low Dose CT Chest recommended if Age 51-80 years, 30 pack-year currently smoking OR have quit w/in 15years.) does not qualify.  Lung Cancer Screening Referral: No   Additional Screening:  Hepatitis C Screening: does not qualify; Completed No   Vision Screening: Recommended annual ophthalmology exams for early detection of glaucoma and other disorders of the eye. Is the patient up to date with their annual eye exam?  Yes  Who is the provider or what is the name of the office in which the patient attends annual eye exams? [facility]  If pt is not established with a provider, would they like to be referred to a provider to establish care? No .   Dental Screening: Recommended annual dental exams for proper oral hygiene  Community Resource Referral / Chronic Care Management: CRR required this visit?  No   CCM required this visit?  No      Plan:     I have personally reviewed and noted the following in the  patient's chart:   Medical and social history Use of alcohol, tobacco or illicit drugs  Current medications and supplements including opioid prescriptions. Patient is not currently taking opioid prescriptions. Functional ability and status Nutritional status Physical activity Advanced directives List of other physicians Hospitalizations, surgeries, and ER visits in previous 12 months Vitals Screenings to include cognitive, depression, and falls Referrals and appointments  In addition, I have reviewed and discussed with patient certain preventive protocols, quality metrics, and best practice recommendations. A written personalized care plan for preventive services as well as general preventive health recommendations were provided to patient.     Jesse Bookman, NP   06/03/2022   Nurse Notes: Advised to get COVID-19 vaccine at the pharmacy.

## 2022-06-21 ENCOUNTER — Ambulatory Visit (HOSPITAL_COMMUNITY): Payer: Medicare PPO

## 2022-06-21 ENCOUNTER — Ambulatory Visit (HOSPITAL_COMMUNITY)
Admission: EM | Admit: 2022-06-21 | Discharge: 2022-06-21 | Disposition: A | Discharge status: Home / Self Care | Payer: Medicare PPO | Attending: Physician Assistant | Admitting: Physician Assistant

## 2022-06-21 ENCOUNTER — Encounter (HOSPITAL_COMMUNITY): Payer: Self-pay | Admitting: *Deleted

## 2022-06-21 ENCOUNTER — Ambulatory Visit (INDEPENDENT_AMBULATORY_CARE_PROVIDER_SITE_OTHER): Payer: Medicare PPO

## 2022-06-21 DIAGNOSIS — M5442 Lumbago with sciatica, left side: Secondary | ICD-10-CM

## 2022-06-21 DIAGNOSIS — M62830 Muscle spasm of back: Secondary | ICD-10-CM

## 2022-06-21 DIAGNOSIS — M545 Low back pain, unspecified: Secondary | ICD-10-CM | POA: Diagnosis not present

## 2022-06-21 MED ORDER — ACETAMINOPHEN 325 MG PO TABS
975.0000 mg | ORAL_TABLET | Freq: Once | ORAL | Status: AC
  Administered 2022-06-21: 975 mg via ORAL

## 2022-06-21 MED ORDER — BACLOFEN 5 MG PO TABS: 5.0000 mg | ORAL_TABLET | Freq: Two times a day (BID) | ORAL | 0 refills | Status: DC | PRN

## 2022-06-21 MED ORDER — LIDOCAINE 5 % EX PTCH: 1.0000 | MEDICATED_PATCH | CUTANEOUS | 0 refills | Status: DC

## 2022-06-21 MED ORDER — ACETAMINOPHEN 325 MG PO TABS
ORAL_TABLET | ORAL | Status: AC
  Filled 2022-06-21: qty 3

## 2022-06-21 NOTE — Discharge Instructions (Signed)
Your x-ray showed arthritis but did not show any evidence of compression fracture.  I suspect that you have a spasm that is pushing on the sciatic nerve.  We gave you some Tylenol today.  You can use Tylenol up to 3 times a day (every 8 hours) at home.  Take baclofen up to twice a day.  This will make you sleepy and is very important that you avoid falling while on this medication.  Do not drive or drink alcohol while taking it.  Use lidocaine patches for additional symptom relief.  I also recommend heat, rest, stretch.  Follow-up with orthopedics if your symptoms do not improve quickly as he may benefit from additional interventions and investigations such as MRI.  If you have any worsening symptoms including difficulty walking, weakness in your legs, going to the bathroom on yourself without noticing it, numbness on the inside of your thighs you should be seen immediately.

## 2022-06-21 NOTE — ED Provider Notes (Signed)
[facility]    CSN: [id number] Arrival date & time: 06/21/22  1435      History   Chief Complaint Chief Complaint  Patient presents with   Back Pain    HPI [name] is a 82 y.o. male.   Patient presents today for several history of severe lower back pain.  He reports he woke up and had significant pain.  He initially did some stretching exercises but this ultimately led to worsening of pain prompting evaluation.  Pain is rated 4 when he sits in a chair but increases to 10 with attempted movement or standing, described as spasm/sharp, no alleviating factors identified.  He has not tried any over-the-counter medication.  He does have a history of significant degenerative disc disease with degenerative changes throughout his lower spine based on MRI obtained 12/27/2011.  Reports that at the time of his MRI he was referred to a neurosurgeon but ultimately decided to use exercises/physical therapy to prevent ongoing pain and has generally been doing well.  Reports that yesterday he did go play disc golf but did not have any known injury or change in his activity.  He is generally very active.  He has been moving recently but did not do the lifting as he had a moving company.  He does report pain going into his left leg but denies any bowel/bladder continence, Weakness, Saddle Anesthesia.    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    HFrEF (heart failure with reduced ejection fraction) (HCC)    Hypertension    Moderate mitral regurgitation    Severe aortic stenosis     Patient Active Problem List   Diagnosis Date Noted   PAC (premature atrial contraction) 03/01/2021   PVC (premature ventricular contraction) 03/01/2021   Atrial bigeminy 02/28/2020   OA (osteoarthritis) of knee 08/07/2019   Primary osteoarthritis of right knee  08/07/2019   Coronary artery disease involving coronary bypass graft of native heart without angina pectoris 09/19/2018   Fatigue 08/01/2018   Mixed hyperlipidemia 06/06/2018   S/P left atrial appendage ligation 07/25/2017   S/P AVR 07/25/2017   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   Coronary artery disease without angina pectoris 07/12/2017   Mitral regurgitation 07/07/2017   Acute on chronic systolic heart failure (HCC) 07/07/2017   Gout 02/12/2013   Essential hypertension 02/12/2013   GERD (gastroesophageal reflux disease) 02/12/2013    Past Surgical History:  Procedure Laterality Date   AORTIC VALVE REPLACEMENT N/A 07/24/2017   Procedure: AORTIC VALVE REPLACEMENT (AVR) using 23mm Inspiris Aortic Valve;  Surgeon: Alleen Borne, MD;  Location: [facility];  Service: Open Heart Surgery;  Laterality: N/A;   CLIPPING OF ATRIAL APPENDAGE N/A 07/24/2017   Procedure: CLIPPING OF ATRIAL APPENDAGE using a 50mm Atricure clip;  Surgeon: Alleen Borne, MD;  Location: [facility] OR;  Service: Open Heart Surgery;  Laterality: N/A;   CORONARY ARTERY BYPASS GRAFT N/A 07/24/2017   Procedure: CORONARY ARTERY BYPASS GRAFTING (CABG) times 3 using the left greater saphenous vein harvested endoscopically and left internal mammary artery.;  Surgeon: Alleen Borne, MD;  Location: [facility];  Service: Open Heart Surgery;  Laterality: N/A;   EYE SURGERY Bilateral    lens replacements for cataracts   HERNIA REPAIR  INGUINAL HERNIA REPAIR Right 05/06/2015   Procedure: LAPAROSCOPIC RIGHT INGUINAL HERNIA WITH MESH;  Surgeon: Abigail Miyamoto, MD;  Location: [facility] ORS;  Service: General;  Laterality: Right;   INSERTION OF MESH Right 05/06/2015   Procedure: INSERTION OF MESH;  Surgeon: Abigail Miyamoto, MD;  Location: [facility];  Service: General;  Laterality: Right;   RIGHT/LEFT HEART CATH AND CORONARY ANGIOGRAPHY N/A 07/10/2017   Procedure: RIGHT/LEFT HEART CATH AND CORONARY ANGIOGRAPHY;  Surgeon: Elder Negus, MD;  [facility];  Service: Cardiovascular;  Laterality: N/A;   TEE WITHOUT CARDIOVERSION N/A 07/11/2017   Procedure: TRANSESOPHAGEAL ECHOCARDIOGRAM (TEE);  Surgeon: Elder Negus, MD;  Location: [facility] ENDOSCOPY;  Service: Cardiovascular;  Laterality: N/A;   TEE WITHOUT CARDIOVERSION N/A 07/24/2017   Procedure: TRANSESOPHAGEAL ECHOCARDIOGRAM (TEE);  Surgeon: Alleen Borne, MD;  Location: [facility] OR;  Service: Open Heart Surgery;  Laterality: N/A;   TOTAL KNEE ARTHROPLASTY Right 08/07/2019   Procedure: TOTAL KNEE ARTHROPLASTY;  Surgeon: Ollen Gross, MD;  Location: [facility];  Service: Orthopedics;  Laterality: Right;   ULTRASOUND GUIDANCE FOR VASCULAR ACCESS  07/10/2017   Procedure: Ultrasound Guidance For Vascular Access;  Surgeon: Elder Negus, MD;  [facility];  Service: Cardiovascular;;       Home Medications    Prior to Admission medications   Medication Sig Start Date End Date Taking? Authorizing Provider  acetaminophen (TYLENOL) 500 MG tablet Take 2 tablets (1,000 mg total) by mouth every 6 (six) hours as needed for mild pain or fever. 07/29/17  Yes Barrett, Leylah Tarnow R, PA-C  amLODipine (NORVASC) 5 MG tablet Take 5 mg by mouth daily.   Yes [provider]  ascorbic acid (VITAMIN C) 500 MG tablet Take 1 tablet (500 mg total) by mouth daily. 03/25/22  Yes Ngetich, Dinah C, NP  aspirin EC 81 MG tablet Take 81 mg by mouth daily. Swallow whole.   Yes [provider]  Baclofen 5 MG TABS Take 1 tablet (5 mg total) by mouth 2 (two) times daily as needed. 06/21/22  Yes Roemello Speyer, Denny Peon K, PA-C  Cetirizine HCl (ZYRTEC ALLERGY) 10 MG CAPS  05/09/22  Yes [provider]  lidocaine (LIDODERM) 5 % Place 1 patch onto the skin daily. Remove & Discard patch within 12 hours or as directed by MD 06/21/22  Yes Eligha Kmetz, Noberto Retort, PA-C  Multiple Vitamin (MULTIVITAMIN ADULT PO) Take 1 tablet by mouth daily.   Yes [provider]  Omega-3 Fatty Acids (FISH OIL) 1000 MG CAPS Take  2 capsules by mouth daily.   Yes [provider]  Phenylephrine HCl (PREPARATION H RE) Place rectally as needed.   Yes [provider]  psyllium (METAMUCIL) 58.6 % powder Take 1 packet by mouth daily as needed (regularity (3-4 times a week)).    Yes [provider]  silodosin (RAPAFLO) 8 MG CAPS capsule Take 8 mg by mouth daily. 11/15/21  Yes [provider]    Family History Family History  Problem Relation Age of Onset   Stroke Mother    Heart attack Father     Social History Social History   Tobacco Use   Smoking status: Former    Packs/day: 1.00    Years: 12.00    Additional pack years: 0.00    Total pack years: 12.00    Types: Cigarettes    Quit date: 1973    Years since quitting: 51.4   Smokeless tobacco: Never   Tobacco comments:  quit 30-40 yrs ago  Vaping Use   Vaping Use: Never used  Substance Use Topics   Alcohol use: Yes    Comment: 1-2 drinks   Drug use: Yes    Types: Marijuana     Allergies   Sulfamethoxazole-trimethoprim   Review of Systems Review of Systems  Constitutional:  Positive for activity change. Negative for appetite change, fatigue and fever.  Gastrointestinal:  Negative for abdominal pain, diarrhea, nausea and vomiting.  Genitourinary:  Negative for difficulty urinating.  Musculoskeletal:  Positive for back pain. Negative for arthralgias and myalgias.  Neurological:  Negative for weakness and numbness.     Physical Exam Triage Vital Signs ED Triage Vitals  Enc Vitals Group     BP 06/21/22 1536 137/73     Pulse Rate 06/21/22 1536 68     Resp 06/21/22 1536 18     Temp 06/21/22 1536 99.7 F (37.6 C)     Temp Source 06/21/22 1536 Oral     SpO2 06/21/22 1536 95 %     Weight --      Height --      Head Circumference --      Peak Flow --      Pain Score 06/21/22 1534 4     Pain Loc --      Pain Edu? --      Excl. in GC? --    No data found.  Updated Vital Signs BP 137/73 (BP Location:  Right Arm)   Pulse 68   Temp 99.7 F (37.6 C) (Oral)   Resp 18   SpO2 95%   Visual Acuity Right Eye Distance:   Left Eye Distance:   Bilateral Distance:    Right Eye Near:   Left Eye Near:    Bilateral Near:     Physical Exam Vitals reviewed.  Constitutional:      General: He is awake.     Appearance: Normal appearance. He is well-developed. He is not ill-appearing.     Comments: Very pleasant male appears stated age sitting in wheelchair in no acute distress  HENT:     Head: Normocephalic and atraumatic.     Mouth/Throat:     Pharynx: No oropharyngeal exudate, posterior oropharyngeal erythema or uvula swelling.  Cardiovascular:     Rate and Rhythm: Normal rate and regular rhythm.     Heart sounds: S1 normal and S2 normal. Murmur heard.  Pulmonary:     Effort: Pulmonary effort is normal.     Breath sounds: Normal breath sounds. No stridor. No wheezing, rhonchi or rales.     Comments: Clear to auscultation bilaterally Musculoskeletal:     Cervical back: No tenderness or bony tenderness.     Thoracic back: No tenderness or bony tenderness.     Lumbar back: Tenderness present. No spasms or bony tenderness. Positive left straight leg raise test. Negative right straight leg raise test.     Comments: Back: No pain percussion of vertebrae.  Tenderness palpation over left lumbar paraspinal muscles.  Patient had significant difficulty lying flat with severe worsening pain.  He was unable to perform Pearlean Brownie as lying flat was painful but did have significant discomfort with even minimal left straight leg raise or movement of the left leg.  He had difficulty getting from wheelchair to exam room table and back down as result of the pain.  Neurological:     Mental Status: He is alert.  Psychiatric:        Behavior: Behavior  is cooperative.      UC Treatments / Results  Labs (all labs ordered are listed, but only abnormal results are displayed) Labs Reviewed - No data to  display  EKG   Radiology DG Lumbar Spine 2-3 Views  Result Date: 06/21/2022 CLINICAL DATA:  Low back pain EXAM: LUMBAR SPINE - 3 VIEW COMPARISON:  12/27/2011 MRI FINDINGS: Five lumbar type vertebral bodies are well visualized. Vertebral body height is well maintained. Multilevel osteophytic change and facet hypertrophic changes seen. Aortic calcifications noted. A single oblique film shows no pars defects. The opposite oblique was not able to be performed due to patient's inability to tolerate the exam. IMPRESSION: Somewhat limited lumbar spine as described. Multilevel degenerative changes are seen without acute abnormality. Electronically Signed   By: Alcide Clever M.D.   On: 06/21/2022 17:31    Procedures Procedures (including critical care time)  Medications Ordered in UC Medications  acetaminophen (TYLENOL) tablet 975 mg (975 mg Oral Given 06/21/22 1740)    Initial Impression / Assessment and Plan / UC Course  I have reviewed the triage vital signs and the nursing notes.  Pertinent labs & imaging results that were available during my care of the patient were reviewed by me and considered in my medical decision making (see chart for details).     Patient is well-appearing, afebrile, nontoxic, nontachycardic.  X-ray was obtained given significant pain which was limited as he had trouble standing for the images.  There was no evidence of acute abnormality including compression fracture but did show degeneration which was known based on MRI from 2013.  He was given Tylenol in clinic and encouraged to use without a milligrams every 8 hours as needed for pain relief.  He was also given lidocaine patches and started on baclofen.  Discussed that baclofen can be sedating and he is not to drive or drink alcohol with taking it.  Also recommended that he take specific precaution to avoid falls.  He can use heat, rest, stretch.  Recommended that he follow-up with orthopedics for further evaluation and  management as he may benefit from more advanced imaging or other interventions such as physical therapy that we do not have access to an urgent care.  Patient is agreeable and will call them to schedule appointment if symptoms do not improve quickly.  Discussed that if he has any worsening or changing symptoms including severe back pain, difficulty ambulating, lower extremity weakness, saddle anesthesia, bowel/bladder incontinence he needs to be seen emergently.  Strict return precautions given.  Final Clinical Impressions(s) / UC Diagnoses   Final diagnoses:  Acute left-sided low back pain with left-sided sciatica  Spasm of muscle of lower back     Discharge Instructions      Your x-ray showed arthritis but did not show any evidence of compression fracture.  I suspect that you have a spasm that is pushing on the sciatic nerve.  We gave you some Tylenol today.  You can use Tylenol up to 3 times a day (every 8 hours) at home.  Take baclofen up to twice a day.  This will make you sleepy and is very important that you avoid falling while on this medication.  Do not drive or drink alcohol while taking it.  Use lidocaine patches for additional symptom relief.  I also recommend heat, rest, stretch.  Follow-up with orthopedics if your symptoms do not improve quickly as he may benefit from additional interventions and investigations such as MRI.  If you  have any worsening symptoms including difficulty walking, weakness in your legs, going to the bathroom on yourself without noticing it, numbness on the inside of your thighs you should be seen immediately.     ED Prescriptions     Medication Sig Dispense Auth. Provider   lidocaine (LIDODERM) 5 % Place 1 patch onto the skin daily. Remove & Discard patch within 12 hours or as directed by MD 30 patch Fateh Kindle K, PA-C   Baclofen 5 MG TABS Take 1 tablet (5 mg total) by mouth 2 (two) times daily as needed. 10 tablet Dash Cardarelli, Noberto Retort, PA-C      PDMP not  reviewed this encounter.   Jeani Hawking, PA-C 06/21/22 1744

## 2022-06-21 NOTE — ED Triage Notes (Signed)
Pt states he woke up this morning with low back pain. He states he has been having muscle spasms all morning. He thought it was better so he went to the gym now he is scared to even walk due to the pain. He did take tylneol this morning at 10am.

## 2022-06-23 ENCOUNTER — Other Ambulatory Visit: Payer: Self-pay

## 2022-06-23 ENCOUNTER — Emergency Department (HOSPITAL_COMMUNITY): Payer: Medicare PPO

## 2022-06-23 ENCOUNTER — Encounter (HOSPITAL_COMMUNITY): Payer: Self-pay | Admitting: Emergency Medicine

## 2022-06-23 ENCOUNTER — Inpatient Hospital Stay (HOSPITAL_COMMUNITY)
Admission: EM | Admit: 2022-06-23 | Discharge: 2022-06-28 | DRG: 644 | Disposition: A | Discharge status: Skilled Nursing Facility | Payer: Medicare PPO | Attending: Internal Medicine | Admitting: Internal Medicine

## 2022-06-23 DIAGNOSIS — R339 Retention of urine, unspecified: Secondary | ICD-10-CM | POA: Diagnosis present

## 2022-06-23 DIAGNOSIS — R410 Disorientation, unspecified: Secondary | ICD-10-CM | POA: Diagnosis not present

## 2022-06-23 DIAGNOSIS — Z7401 Bed confinement status: Secondary | ICD-10-CM

## 2022-06-23 DIAGNOSIS — M5124 Other intervertebral disc displacement, thoracic region: Secondary | ICD-10-CM | POA: Diagnosis present

## 2022-06-23 DIAGNOSIS — E860 Dehydration: Secondary | ICD-10-CM | POA: Diagnosis present

## 2022-06-23 DIAGNOSIS — I255 Ischemic cardiomyopathy: Secondary | ICD-10-CM | POA: Diagnosis present

## 2022-06-23 DIAGNOSIS — I10 Essential (primary) hypertension: Secondary | ICD-10-CM | POA: Diagnosis not present

## 2022-06-23 DIAGNOSIS — Z79899 Other long term (current) drug therapy: Secondary | ICD-10-CM

## 2022-06-23 DIAGNOSIS — Z881 Allergy status to other antibiotic agents status: Secondary | ICD-10-CM

## 2022-06-23 DIAGNOSIS — Z823 Family history of stroke: Secondary | ICD-10-CM | POA: Diagnosis not present

## 2022-06-23 DIAGNOSIS — G8929 Other chronic pain: Secondary | ICD-10-CM | POA: Diagnosis not present

## 2022-06-23 DIAGNOSIS — M40204 Unspecified kyphosis, thoracic region: Secondary | ICD-10-CM | POA: Diagnosis not present

## 2022-06-23 DIAGNOSIS — I2510 Atherosclerotic heart disease of native coronary artery without angina pectoris: Secondary | ICD-10-CM | POA: Diagnosis present

## 2022-06-23 DIAGNOSIS — I5042 Chronic combined systolic (congestive) and diastolic (congestive) heart failure: Secondary | ICD-10-CM | POA: Diagnosis present

## 2022-06-23 DIAGNOSIS — R262 Difficulty in walking, not elsewhere classified: Secondary | ICD-10-CM | POA: Diagnosis present

## 2022-06-23 DIAGNOSIS — E871 Hypo-osmolality and hyponatremia: Principal | ICD-10-CM | POA: Diagnosis present

## 2022-06-23 DIAGNOSIS — Z953 Presence of xenogenic heart valve: Secondary | ICD-10-CM | POA: Diagnosis not present

## 2022-06-23 DIAGNOSIS — M47816 Spondylosis without myelopathy or radiculopathy, lumbar region: Secondary | ICD-10-CM | POA: Diagnosis not present

## 2022-06-23 DIAGNOSIS — I5032 Chronic diastolic (congestive) heart failure: Secondary | ICD-10-CM | POA: Diagnosis not present

## 2022-06-23 DIAGNOSIS — N281 Cyst of kidney, acquired: Secondary | ICD-10-CM

## 2022-06-23 DIAGNOSIS — E222 Syndrome of inappropriate secretion of antidiuretic hormone: Secondary | ICD-10-CM | POA: Diagnosis not present

## 2022-06-23 DIAGNOSIS — Z96651 Presence of right artificial knee joint: Secondary | ICD-10-CM | POA: Diagnosis present

## 2022-06-23 DIAGNOSIS — Z7982 Long term (current) use of aspirin: Secondary | ICD-10-CM

## 2022-06-23 DIAGNOSIS — I25810 Atherosclerosis of coronary artery bypass graft(s) without angina pectoris: Secondary | ICD-10-CM | POA: Diagnosis present

## 2022-06-23 DIAGNOSIS — M109 Gout, unspecified: Secondary | ICD-10-CM | POA: Diagnosis present

## 2022-06-23 DIAGNOSIS — Z882 Allergy status to sulfonamides status: Secondary | ICD-10-CM

## 2022-06-23 DIAGNOSIS — Z87891 Personal history of nicotine dependence: Secondary | ICD-10-CM

## 2022-06-23 DIAGNOSIS — R932 Abnormal findings on diagnostic imaging of liver and biliary tract: Secondary | ICD-10-CM | POA: Diagnosis present

## 2022-06-23 DIAGNOSIS — K5900 Constipation, unspecified: Secondary | ICD-10-CM | POA: Diagnosis present

## 2022-06-23 DIAGNOSIS — I110 Hypertensive heart disease with heart failure: Secondary | ICD-10-CM | POA: Diagnosis present

## 2022-06-23 DIAGNOSIS — M545 Low back pain, unspecified: Secondary | ICD-10-CM | POA: Diagnosis present

## 2022-06-23 DIAGNOSIS — R0602 Shortness of breath: Secondary | ICD-10-CM | POA: Diagnosis not present

## 2022-06-23 DIAGNOSIS — M5126 Other intervertebral disc displacement, lumbar region: Secondary | ICD-10-CM | POA: Diagnosis present

## 2022-06-23 DIAGNOSIS — M549 Dorsalgia, unspecified: Secondary | ICD-10-CM | POA: Diagnosis not present

## 2022-06-23 DIAGNOSIS — N401 Enlarged prostate with lower urinary tract symptoms: Secondary | ICD-10-CM | POA: Diagnosis present

## 2022-06-23 DIAGNOSIS — M4804 Spinal stenosis, thoracic region: Secondary | ICD-10-CM | POA: Diagnosis present

## 2022-06-23 DIAGNOSIS — Z8249 Family history of ischemic heart disease and other diseases of the circulatory system: Secondary | ICD-10-CM | POA: Diagnosis not present

## 2022-06-23 DIAGNOSIS — M5136 Other intervertebral disc degeneration, lumbar region: Secondary | ICD-10-CM | POA: Diagnosis not present

## 2022-06-23 DIAGNOSIS — I5030 Unspecified diastolic (congestive) heart failure: Secondary | ICD-10-CM | POA: Diagnosis present

## 2022-06-23 DIAGNOSIS — I509 Heart failure, unspecified: Secondary | ICD-10-CM | POA: Diagnosis not present

## 2022-06-23 DIAGNOSIS — M5442 Lumbago with sciatica, left side: Secondary | ICD-10-CM | POA: Diagnosis not present

## 2022-06-23 LAB — CBC WITH DIFFERENTIAL/PLATELET
Abs Immature Granulocytes: 0.04 10*3/uL (ref 0.00–0.07)
Basophils Absolute: 0 10*3/uL (ref 0.0–0.1)
Basophils Relative: 0 %
Eosinophils Absolute: 0 10*3/uL (ref 0.0–0.5)
Eosinophils Relative: 0 %
HCT: 32.8 % — ABNORMAL LOW (ref 39.0–52.0)
Hemoglobin: 11.4 g/dL — ABNORMAL LOW (ref 13.0–17.0)
Immature Granulocytes: 0 %
Lymphocytes Relative: 4 %
Lymphs Abs: 0.4 10*3/uL — ABNORMAL LOW (ref 0.7–4.0)
MCH: 32.2 pg (ref 26.0–34.0)
MCHC: 34.8 g/dL (ref 30.0–36.0)
MCV: 92.7 fL (ref 80.0–100.0)
Monocytes Absolute: 0.9 10*3/uL (ref 0.1–1.0)
Monocytes Relative: 8 %
Neutro Abs: 8.8 10*3/uL — ABNORMAL HIGH (ref 1.7–7.7)
Neutrophils Relative %: 88 %
Platelets: 167 10*3/uL (ref 150–400)
RBC: 3.54 MIL/uL — ABNORMAL LOW (ref 4.22–5.81)
RDW: 11.8 % (ref 11.5–15.5)
WBC: 10.1 10*3/uL (ref 4.0–10.5)
nRBC: 0 % (ref 0.0–0.2)

## 2022-06-23 MED ORDER — METHOCARBAMOL 500 MG PO TABS
500.0000 mg | ORAL_TABLET | Freq: Once | ORAL | Status: AC
  Administered 2022-06-23: 500 mg via ORAL
  Filled 2022-06-23: qty 1

## 2022-06-23 MED ORDER — LIDOCAINE 5 % EX PTCH
1.0000 | MEDICATED_PATCH | CUTANEOUS | Status: DC
  Administered 2022-06-23 – 2022-06-27 (×5): 1 via TRANSDERMAL
  Filled 2022-06-23 (×5): qty 1

## 2022-06-23 NOTE — ED Provider Notes (Signed)
Patient here for constipation.  Has had some urinary retention.    Seen by and discussed with Dr. Dalene Seltzer, who recommends MR and CT and repeat eval.  Sodium noted to be 117.  He has received 2 L of normal saline.  Will hold off on any additional normal saline.  Will restrict free water.  Patient states that he has been drinking a lot of water.  He reportedly plays disc golf and go cycling and states that he drinks a lot of water.  CT consistent with constipation.  Questionable liver abnormality, but no trauma or focal tenderness, doubt liver laceration.  MRI with several degenerative changes and stenosis also with abnormal signal at T11/T12, dedicated thoracic MRI has been ordered per radiology recommendations.  I discussed the case with Dr. Antionette Char, who will see the patient.  He is appreciated for admitting.   Roxy Horseman, PA-C 06/24/22 0419    Sabas Sous, MD 06/24/22 [phone]

## 2022-06-23 NOTE — ED Provider Notes (Signed)
[place] EM[facility]ENCY DEPARTMENT AT [facility] Provider Note   CSN: [id number] Arrival date & time: 06/23/22  1944     History  Chief Complaint  Patient presents with   Back Pain   Constipation    [name] is a 82 y.o. male. With a history of gout, hypertension, CAD, CHF, hepatitis who presents to the ED for evaluation of low back pain. States this began abruptly 2 days ago. He was active, played a round of disc golf and went on 2 bike rides the day before, which is his normal routine. He woke up the next day with intense pain in the midline low back with radiation down his posterior legs bilaterally, with left worse than right. He typically has 2 to 3 bowel movements per day, but has been unable to have a bowel movement since his injury 3 days ago. Used a suppository today and was unsuccessful in having a bowel movement. He denies saddle paresthesias, urinary or fecal incontinence, urinary retention, fevers, history of injection drug use, lower extremity numbness, weakness or tingling.  Also denies chest pain, shortness of breath, abdominal pain, nausea, vomiting. Does have some difficulty walking secondary to pain.  He also reports that he is in the middle of a move and has been more active than normal recently.  His pain is worsened with any movement.  He has been taking his prescribed baclofen and lidocaine patches which were given to him at urgent care 2 days ago.  He denies any specific injuries or trauma to the low back.  He did have an MRI in 2013 which was overall reassuring. States he has had low back pain with sciatica before and was referred to a neurosurgeon but did not follow-up with them and instead decided to increase his exercise level which resolved his symptoms. Patient adds that he has not been able to pass gas today. States he doesn't feel like he will be able to go home tonight.   Back Pain Constipation Associated symptoms: back pain        Home  Medications Prior to Admission medications   Medication Sig Start Date End Date Taking? Authorizing Provider  acetaminophen (TYLENOL) 500 MG tablet Take 2 tablets (1,000 mg total) by mouth every 6 (six) hours as needed for mild pain or fever. 07/29/17   Barrett, Erin R, PA-C  amLODipine (NORVASC) 5 MG tablet Take 5 mg by mouth daily.    [provider]  ascorbic acid (VITAMIN C) 500 MG tablet Take 1 tablet (500 mg total) by mouth daily. 03/25/22   Ngetich, Dinah C, NP  aspirin EC 81 MG tablet Take 81 mg by mouth daily. Swallow whole.    [provider]  Baclofen 5 MG TABS Take 1 tablet (5 mg total) by mouth 2 (two) times daily as needed. 06/21/22   Raspet, Noberto Retort, PA-C  Cetirizine HCl (ZYRTEC ALLERGY) 10 MG CAPS  05/09/22   [provider]  lidocaine (LIDODERM) 5 % Place 1 patch onto the skin daily. Remove & Discard patch within 12 hours or as directed by MD 06/21/22   Raspet, Noberto Retort, PA-C  Multiple Vitamin (MULTIVITAMIN ADULT PO) Take 1 tablet by mouth daily.    [provider]  Omega-3 Fatty Acids (FISH OIL) 1000 MG CAPS Take 2 capsules by mouth daily.    [provider]  Phenylephrine HCl (PREPARATION H RE) Place rectally as needed.    [provider]  psyllium (METAMUCIL) 58.6 % powder  Take 1 packet by mouth daily as needed (regularity (3-4 times a week)).     [provider]  silodosin (RAPAFLO) 8 MG CAPS capsule Take 8 mg by mouth daily. 11/15/21   [provider]      Allergies    Sulfamethoxazole-trimethoprim    Review of Systems   Review of Systems  Gastrointestinal:  Positive for constipation.  Musculoskeletal:  Positive for back pain.  All other systems reviewed and are negative.   Physical Exam Updated Vital Signs BP (!) 173/87 (BP Location: Right Arm)   Pulse 85   Temp 99.1 F (37.3 C) (Oral)   Resp 19   Ht 6' (1.829 m)   Wt 76 kg   SpO2 98%   BMI 22.72 kg/m  Physical Exam Vitals and nursing note  reviewed. Exam conducted with a chaperone present Marcello Moores, RN).  Constitutional:      General: He is not in acute distress.    Appearance: He is well-developed.  HENT:     Head: Normocephalic and atraumatic.  Eyes:     Conjunctiva/sclera: Conjunctivae normal.  Cardiovascular:     Rate and Rhythm: Normal rate and regular rhythm.     Heart sounds: No murmur heard. Pulmonary:     Effort: Pulmonary effort is normal. No respiratory distress.     Breath sounds: Normal breath sounds.  Abdominal:     Palpations: Abdomen is soft.     Tenderness: There is no abdominal tenderness. There is no guarding.  Genitourinary:    Comments: Normal rectal sphincter tone Musculoskeletal:        General: No swelling.     Cervical back: Neck supple.     Comments: No midline T or L-spine TTP, step-offs, deformities or crepitus.  Mild TTP to the paraspinal muscles of the lumbar region.  Straight leg raise positive bilaterally with left worse than right. Possible weakness with left hip flexion when compared contralaterally however difficult to assess due to pain.  Skin:    General: Skin is warm and dry.     Capillary Refill: Capillary refill takes less than 2 seconds.  Neurological:     General: No focal deficit present.     Mental Status: He is alert and oriented to person, place, and time.  Psychiatric:        Mood and Affect: Mood normal.     ED Results / Procedures / Treatments   Labs (all labs ordered are listed, but only abnormal results are displayed) Labs Reviewed  CBC WITH DIFFERENTIAL/PLATELET  COMPREHENSIVE METABOLIC PANEL  URINALYSIS, W/ REFLEX TO CULTURE (INFECTION SUSPECTED)    EKG None  Radiology DG Lumbar Spine Complete  Result Date: 06/23/2022 CLINICAL DATA:  Pain EXAM: LUMBAR SPINE - COMPLETE 4+ VIEW COMPARISON:  Lumbar spine x-ray 06/21/2022 FINDINGS: There is no evidence of lumbar spine fracture. Alignment is normal. There is moderate disc space narrowing at all levels of the  lumbar spine with endplate osteophyte formation compatible with degenerative change. There is a very large amount of stool throughout the colon. There are atherosclerotic calcifications of the aorta. IMPRESSION: 1. No acute fracture or dislocation. 2. Moderate degenerative changes throughout the lumbar spine. 3. Very large amount of stool throughout the colon. Electronically Signed   By: Darliss Cheney M.D.   On: 06/23/2022 20:39    Procedures Procedures    Medications Ordered in ED Medications  lidocaine (LIDODERM) 5 % 1 patch (1 patch Transdermal Patch Applied 06/23/22 2058)  methocarbamol (ROBAXIN) tablet 500  mg (500 mg Oral Given 06/23/22 2057)    ED Course/ Medical Decision Making/ A&P                             Medical Decision Making Amount and/or Complexity of Data Reviewed Labs: ordered. Radiology: ordered.  Risk Prescription drug management.  This patient presents to the ED for concern of low back pain, this involves an extensive number of treatment options, and is a complaint that carries with it a high risk of complications and morbidity.  Emergent considerations in the differential diagnosis of back pain include:occult fracture, congenital anomalies, tumors, vascular catastrophes, osteomyelitis of vertebrae, infections of disc, meninges or cord, space occupying lesions within canal leading to cord or root compression including epidural abscess.   Co morbidities that complicate the patient evaluation  gout, hypertension, CAD, CHF, hepatitis  My initial workup includes symptom control, x-ray lumbar spine  Additional history obtained from: Nursing notes from this visit. Previous records within EMR system urgent care visit for same 2 days prior EMS provided portion of the history  I ordered, reviewed and interpreted labs which include: BMP, CBC, urinalysis  I ordered imaging studies including x-ray lumbar spine, CT abdomen pelvis, MRI lumbar spine I independently  visualized and interpreted imaging which showed chronic degenerative changes.  I agree with the radiologist interpretation  Afebrile, hypertensive but otherwise hemodynamically stable. 82 year old male presenting to the ED for evaluation of low back pain and constipation. Physical exam reveals slight weakness to the left leg however this is limited by pain. He has been using lidoderm patches and baclofen with minimal improvement. Has not had a bowel movement in 3 days. Typically has 2 to 3 bowel movements per day. Used a suppository today without success. Also states he has not been passing gas today. While in the ED he developed some urinary retention. Lumbar x ray overall reassuring. Will obtain MRI lumbar spine to rule out cord compression. Will also obtain basic labs and CT abdomen pelvis to rule out bowel obstruction as the cause of his constipation. Plan at the time of shift change is pending further workup and reassessment. If workup is reassuring patient will be discharged with neurosurgery follow up. Care will be handed off to [employer], [place]. Please see his note for final disposition and decision making.   Patient's case discussed with Dr. Dalene Seltzer who agrees with plan to reevaluate after workup is complete.   Note: Portions of this report may have been transcribed using voice recognition software. Every effort was made to ensure accuracy; however, inadvertent computerized transcription errors may still be present.        Final Clinical Impression(s) / ED Diagnoses Final diagnoses:  None    Rx / DC Orders ED Discharge Orders     None         Mora Bellman 06/23/22 2343    Alvira Monday, MD 06/24/22 1357

## 2022-06-23 NOTE — ED Triage Notes (Signed)
Patient BIB [facility] c/o chronic back pain and constipation x 3 days.  Patient seen recently for back pain.

## 2022-06-23 NOTE — ED Provider Triage Note (Signed)
Emergency Medicine Provider Triage Evaluation Note  [name] , a 82 y.o. male  was evaluated in triage.  Pt complains of low back pain.  Began suddenly on Tuesday.  Has a history of low back pain but never to this extent.  Significantly worse with movement.  No numbness, weakness or tingling of the lower extremities.  No saddle paresthesias, urinary or fecal incontinence.  He has not had a bowel movement in 3 days which is unusual as he typically has 2-3 bowel movements per day.  Feels similar to sciatica.  No fevers or history of injection drug use.  Review of Systems  Positive: As above Negative: As above  Physical Exam  BP (!) 173/87 (BP Location: Right Arm)   Pulse 85   Temp 99.1 F (37.3 C) (Oral)   Resp 19   Ht 6' (1.829 m)   Wt 76 kg   SpO2 98%   BMI 22.72 kg/m  Gen:   Awake, no distress   Resp:  Normal effort  MSK:   Difficulty with movement Other:  No midline T or L-spine TTP.    Medical Decision Making  Medically screening exam initiated at 7:58 PM.  Appropriate orders placed.  [name] was informed that the remainder of the evaluation will be completed by another provider, this initial triage assessment does not replace that evaluation, and the importance of remaining in the ED until their evaluation is complete.     Michelle Piper, [place] 06/23/22 1959

## 2022-06-23 NOTE — ED Notes (Signed)
Patient transported to X-ray 

## 2022-06-23 NOTE — ED Notes (Signed)
Patient transported to MRI 

## 2022-06-24 ENCOUNTER — Encounter (HOSPITAL_COMMUNITY): Payer: Self-pay | Admitting: Family Medicine

## 2022-06-24 ENCOUNTER — Emergency Department (HOSPITAL_COMMUNITY): Payer: Medicare PPO

## 2022-06-24 ENCOUNTER — Inpatient Hospital Stay (HOSPITAL_COMMUNITY): Payer: Medicare PPO

## 2022-06-24 DIAGNOSIS — E222 Syndrome of inappropriate secretion of antidiuretic hormone: Secondary | ICD-10-CM | POA: Diagnosis present

## 2022-06-24 DIAGNOSIS — R262 Difficulty in walking, not elsewhere classified: Secondary | ICD-10-CM | POA: Diagnosis present

## 2022-06-24 DIAGNOSIS — Z8249 Family history of ischemic heart disease and other diseases of the circulatory system: Secondary | ICD-10-CM | POA: Diagnosis not present

## 2022-06-24 DIAGNOSIS — M4804 Spinal stenosis, thoracic region: Secondary | ICD-10-CM | POA: Diagnosis present

## 2022-06-24 DIAGNOSIS — M5126 Other intervertebral disc displacement, lumbar region: Secondary | ICD-10-CM | POA: Diagnosis present

## 2022-06-24 DIAGNOSIS — N281 Cyst of kidney, acquired: Secondary | ICD-10-CM | POA: Diagnosis present

## 2022-06-24 DIAGNOSIS — M109 Gout, unspecified: Secondary | ICD-10-CM | POA: Diagnosis present

## 2022-06-24 DIAGNOSIS — M545 Low back pain, unspecified: Secondary | ICD-10-CM | POA: Diagnosis present

## 2022-06-24 DIAGNOSIS — I110 Hypertensive heart disease with heart failure: Secondary | ICD-10-CM | POA: Diagnosis present

## 2022-06-24 DIAGNOSIS — R932 Abnormal findings on diagnostic imaging of liver and biliary tract: Secondary | ICD-10-CM | POA: Diagnosis present

## 2022-06-24 DIAGNOSIS — Z953 Presence of xenogenic heart valve: Secondary | ICD-10-CM | POA: Diagnosis not present

## 2022-06-24 DIAGNOSIS — Z823 Family history of stroke: Secondary | ICD-10-CM | POA: Diagnosis not present

## 2022-06-24 DIAGNOSIS — Z881 Allergy status to other antibiotic agents status: Secondary | ICD-10-CM | POA: Diagnosis not present

## 2022-06-24 DIAGNOSIS — R339 Retention of urine, unspecified: Secondary | ICD-10-CM | POA: Diagnosis present

## 2022-06-24 DIAGNOSIS — K5900 Constipation, unspecified: Secondary | ICD-10-CM | POA: Diagnosis present

## 2022-06-24 DIAGNOSIS — Z7982 Long term (current) use of aspirin: Secondary | ICD-10-CM | POA: Diagnosis not present

## 2022-06-24 DIAGNOSIS — M5442 Lumbago with sciatica, left side: Secondary | ICD-10-CM | POA: Diagnosis not present

## 2022-06-24 DIAGNOSIS — Z882 Allergy status to sulfonamides status: Secondary | ICD-10-CM | POA: Diagnosis not present

## 2022-06-24 DIAGNOSIS — I2510 Atherosclerotic heart disease of native coronary artery without angina pectoris: Secondary | ICD-10-CM | POA: Diagnosis present

## 2022-06-24 DIAGNOSIS — I5042 Chronic combined systolic (congestive) and diastolic (congestive) heart failure: Secondary | ICD-10-CM | POA: Diagnosis present

## 2022-06-24 DIAGNOSIS — E871 Hypo-osmolality and hyponatremia: Principal | ICD-10-CM | POA: Diagnosis present

## 2022-06-24 DIAGNOSIS — I5032 Chronic diastolic (congestive) heart failure: Secondary | ICD-10-CM | POA: Diagnosis not present

## 2022-06-24 DIAGNOSIS — Z87891 Personal history of nicotine dependence: Secondary | ICD-10-CM | POA: Diagnosis not present

## 2022-06-24 DIAGNOSIS — Z79899 Other long term (current) drug therapy: Secondary | ICD-10-CM | POA: Diagnosis not present

## 2022-06-24 DIAGNOSIS — I255 Ischemic cardiomyopathy: Secondary | ICD-10-CM | POA: Diagnosis present

## 2022-06-24 DIAGNOSIS — Z96651 Presence of right artificial knee joint: Secondary | ICD-10-CM | POA: Diagnosis present

## 2022-06-24 DIAGNOSIS — N401 Enlarged prostate with lower urinary tract symptoms: Secondary | ICD-10-CM | POA: Diagnosis present

## 2022-06-24 DIAGNOSIS — E860 Dehydration: Secondary | ICD-10-CM | POA: Diagnosis present

## 2022-06-24 LAB — CBC
HCT: 32.3 % — ABNORMAL LOW (ref 39.0–52.0)
Hemoglobin: 11.4 g/dL — ABNORMAL LOW (ref 13.0–17.0)
MCH: 32 pg (ref 26.0–34.0)
MCHC: 35.3 g/dL (ref 30.0–36.0)
MCV: 90.7 fL (ref 80.0–100.0)
Platelets: 171 10*3/uL (ref 150–400)
RBC: 3.56 MIL/uL — ABNORMAL LOW (ref 4.22–5.81)
RDW: 11.8 % (ref 11.5–15.5)
WBC: 9.4 10*3/uL (ref 4.0–10.5)
nRBC: 0 % (ref 0.0–0.2)

## 2022-06-24 LAB — BASIC METABOLIC PANEL
Anion gap: 10 (ref 5–15)
Anion gap: 11 (ref 5–15)
Anion gap: 12 (ref 5–15)
Anion gap: 9 (ref 5–15)
BUN: 10 mg/dL (ref 8–23)
BUN: 10 mg/dL (ref 8–23)
BUN: 11 mg/dL (ref 8–23)
BUN: 12 mg/dL (ref 8–23)
CO2: 22 mmol/L (ref 22–32)
CO2: 23 mmol/L (ref 22–32)
CO2: 21 mmol/L — ABNORMAL LOW (ref 22–32)
CO2: 24 mmol/L (ref 22–32)
Calcium: 8.4 mg/dL — ABNORMAL LOW (ref 8.9–10.3)
Calcium: 8.9 mg/dL (ref 8.9–10.3)
Calcium: 8.4 mg/dL — ABNORMAL LOW (ref 8.9–10.3)
Calcium: 8.2 mg/dL — ABNORMAL LOW (ref 8.9–10.3)
Chloride: 87 mmol/L — ABNORMAL LOW (ref 98–111)
Chloride: 87 mmol/L — ABNORMAL LOW (ref 98–111)
Chloride: 87 mmol/L — ABNORMAL LOW (ref 98–111)
Chloride: 87 mmol/L — ABNORMAL LOW (ref 98–111)
Creatinine, Ser: 0.73 mg/dL (ref 0.61–1.24)
Creatinine, Ser: 0.76 mg/dL (ref 0.61–1.24)
Creatinine, Ser: 0.79 mg/dL (ref 0.61–1.24)
Creatinine, Ser: 0.74 mg/dL (ref 0.61–1.24)
GFR, Estimated: >60 mL/min (ref >60)
GFR, Estimated: >60 mL/min (ref >60)
GFR, Estimated: >60 mL/min (ref >60)
GFR, Estimated: >60 mL/min (ref >60)
Glucose, Bld: 116 mg/dL — ABNORMAL HIGH (ref 70–99)
Glucose, Bld: 122 mg/dL — ABNORMAL HIGH (ref 70–99)
Glucose, Bld: 147 mg/dL — ABNORMAL HIGH (ref 70–99)
Glucose, Bld: 121 mg/dL — ABNORMAL HIGH (ref 70–99)
Potassium: 3.9 mmol/L (ref 3.5–5.1)
Potassium: 3.9 mmol/L (ref 3.5–5.1)
Potassium: 4.4 mmol/L (ref 3.5–5.1)
Potassium: 4.5 mmol/L (ref 3.5–5.1)
Sodium: 119 mmol/L — CL (ref 135–145)
Sodium: 121 mmol/L — ABNORMAL LOW (ref 135–145)
Sodium: 120 mmol/L — ABNORMAL LOW (ref 135–145)
Sodium: 120 mmol/L — ABNORMAL LOW (ref 135–145)

## 2022-06-24 LAB — COMPREHENSIVE METABOLIC PANEL
ALT: 21 U/L (ref 0–44)
AST: 19 U/L (ref 15–41)
Albumin: 3 g/dL — ABNORMAL LOW (ref 3.5–5.0)
Alkaline Phosphatase: 50 U/L (ref 38–126)
Anion gap: 13 (ref 5–15)
BUN: 12 mg/dL (ref 8–23)
CO2: 21 mmol/L — ABNORMAL LOW (ref 22–32)
Calcium: 8.6 mg/dL — ABNORMAL LOW (ref 8.9–10.3)
Chloride: 83 mmol/L — ABNORMAL LOW (ref 98–111)
Creatinine, Ser: 0.76 mg/dL (ref 0.61–1.24)
GFR, Estimated: >60 mL/min (ref >60)
Glucose, Bld: 107 mg/dL — ABNORMAL HIGH (ref 70–99)
Potassium: 4 mmol/L (ref 3.5–5.1)
Sodium: 117 mmol/L — CL (ref 135–145)
Total Bilirubin: 1.3 mg/dL — ABNORMAL HIGH (ref 0.3–1.2)
Total Protein: 6.1 g/dL — ABNORMAL LOW (ref 6.5–8.1)

## 2022-06-24 LAB — URINALYSIS, W/ REFLEX TO CULTURE (INFECTION SUSPECTED)
Bilirubin Urine: NEGATIVE
Glucose, UA: NEGATIVE mg/dL
Hgb urine dipstick: NEGATIVE
Ketones, ur: 20 mg/dL — AB
Leukocytes,Ua: NEGATIVE
Nitrite: NEGATIVE
Protein, ur: NEGATIVE mg/dL
Specific Gravity, Urine: 1.008 (ref 1.005–1.030)
pH: 6 (ref 5.0–8.0)

## 2022-06-24 MED ORDER — OXYCODONE HCL 5 MG PO TABS
5.0000 mg | ORAL_TABLET | ORAL | Status: DC | PRN
  Administered 2022-06-24: 5 mg via ORAL
  Filled 2022-06-24: qty 1

## 2022-06-24 MED ORDER — BISACODYL 5 MG PO TBEC: 5.0000 mg | DELAYED_RELEASE_TABLET | Freq: Every day | ORAL | Status: DC | PRN

## 2022-06-24 MED ORDER — MILK AND MOLASSES ENEMA
1.0000 | Freq: Once | RECTAL | Status: AC
  Administered 2022-06-24: 240 mL via RECTAL
  Filled 2022-06-24: qty 240

## 2022-06-24 MED ORDER — SODIUM CHLORIDE 0.9 % IV BOLUS
1000.0000 mL | Freq: Once | INTRAVENOUS | Status: AC
  Administered 2022-06-24: 1000 mL via INTRAVENOUS

## 2022-06-24 MED ORDER — SODIUM CHLORIDE 0.9 % IV SOLN: INTRAVENOUS | Status: DC

## 2022-06-24 MED ORDER — HYDROMORPHONE HCL 1 MG/ML IJ SOLN
0.5000 mg | INTRAMUSCULAR | Status: DC | PRN
  Administered 2022-06-24: 0.5 mg via INTRAVENOUS
  Filled 2022-06-24: qty 0.5
  Filled 2022-06-24: qty 1

## 2022-06-24 MED ORDER — ACETAMINOPHEN 650 MG RE SUPP: 650.0000 mg | Freq: Four times a day (QID) | RECTAL | Status: DC | PRN

## 2022-06-24 MED ORDER — SENNOSIDES-DOCUSATE SODIUM 8.6-50 MG PO TABS: 1.0000 | ORAL_TABLET | Freq: Every evening | ORAL | Status: DC | PRN

## 2022-06-24 MED ORDER — IOHEXOL 350 MG/ML SOLN
75.0000 mL | Freq: Once | INTRAVENOUS | Status: AC | PRN
  Administered 2022-06-24: 75 mL via INTRAVENOUS

## 2022-06-24 MED ORDER — MAGNESIUM CITRATE PO SOLN
1.0000 | Freq: Once | ORAL | Status: AC | PRN
  Administered 2022-06-24: 1 via ORAL
  Filled 2022-06-24: qty 296

## 2022-06-24 MED ORDER — METHOCARBAMOL 500 MG PO TABS
500.0000 mg | ORAL_TABLET | Freq: Four times a day (QID) | ORAL | Status: DC | PRN
  Filled 2022-06-24 (×3): qty 1

## 2022-06-24 MED ORDER — HYDROMORPHONE HCL 1 MG/ML IJ SOLN
0.5000 mg | Freq: Once | INTRAMUSCULAR | Status: AC
  Administered 2022-06-24: 0.5 mg via INTRAVENOUS
  Filled 2022-06-24: qty 1

## 2022-06-24 MED ORDER — BISACODYL 10 MG RE SUPP
10.0000 mg | Freq: Once | RECTAL | Status: AC
  Administered 2022-06-24: 10 mg via RECTAL
  Filled 2022-06-24: qty 1

## 2022-06-24 MED ORDER — SODIUM CHLORIDE 0.9% FLUSH
3.0000 mL | Freq: Two times a day (BID) | INTRAVENOUS | Status: DC
  Administered 2022-06-24 – 2022-06-28 (×7): 3 mL via INTRAVENOUS

## 2022-06-24 MED ORDER — ENOXAPARIN SODIUM 40 MG/0.4ML IJ SOSY
40.0000 mg | PREFILLED_SYRINGE | INTRAMUSCULAR | Status: DC
  Administered 2022-06-24 – 2022-06-28 (×5): 40 mg via SUBCUTANEOUS
  Filled 2022-06-24 (×5): qty 0.4

## 2022-06-24 MED ORDER — ACETAMINOPHEN 325 MG PO TABS: 650.0000 mg | ORAL_TABLET | Freq: Four times a day (QID) | ORAL | Status: DC | PRN

## 2022-06-24 NOTE — ED Notes (Signed)
Provider notified of critical sodium

## 2022-06-24 NOTE — ED Notes (Signed)
Pt in MRI.

## 2022-06-24 NOTE — Progress Notes (Signed)
PT Cancellation Note  Patient Details Name: Ruhaan Taubman MRN: [id number] DOB: [birth year]   Cancelled Treatment:    Reason Eval/Treat Not Completed: Medical issues which prohibited therapy.  Labs are being assessed and corrected, reattempt at another time.   Ivar Drape 06/24/2022, 10:08 AM  Samul Dada, PT PhD Acute Rehab Dept. Number: [facility] [id number] and [facility] [phone]

## 2022-06-24 NOTE — Progress Notes (Signed)
Patient admitted early this morning for hyponatremia and severe low back pain.  Patient seen and examined at bedside.  Plan of care discussed with him and son at bedside.  I have reviewed patient's medical records including this morning's H&P, current vitals, labs, medications myself.  Sodium 119 this morning.  Continue following BMP.  Start normal saline at 100 cc an hour.  Pain management.  Fall precautions.  PT eval. patient feels very constipated.  Wants to try suppository.

## 2022-06-24 NOTE — ED Notes (Signed)
ED TO INPATIENT HANDOFF REPORT  ED Nurse Name and Phone #: Vernona Rieger  [phone]  S Name/Age/Gender [name] 82 y.o. male Room/Bed: 039C/039C  Code Status   Code Status: Full Code  Home/SNF/Other Home Patient oriented to: self, place, time, and situation Is this baseline? Yes   Triage Complete: Triage complete  Chief Complaint Hyponatremia [E87.1]  Triage Note Patient BIB [facility] c/o chronic back pain and constipation x 3 days.  Patient seen recently for back pain.    Allergies Allergies  Allergen Reactions   Sulfamethoxazole-Trimethoprim Rash    Level of Care/Admitting Diagnosis ED Disposition     ED Disposition  Admit   Condition  --   Comment  Hospital Area: [facility] [[id number]]  Level of Care: Progressive [102]  Admit to Progressive based on following criteria: NEPHROLOGY stable condition requiring close monitoring for AKI, requiring Hemodialysis or Peritoneal Dialysis either from expected electrolyte imbalance, acidosis, or fluid overload that can be managed by NIPPV or high flow oxygen.  May admit patient to [facility] or [facility] if equivalent level of care is available:: Yes  Covid Evaluation: Asymptomatic - no recent exposure (last 10 days) testing not required  Diagnosis: Hyponatremia [198519]  Admitting Physician: Briscoe Deutscher [[id number]]  Attending Physician: Briscoe Deutscher [[id number]]  Certification:: I certify this patient will need inpatient services for at least 2 midnights  Estimated Length of Stay: 3          B Medical/Surgery History 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    HFrEF (heart failure with reduced ejection fraction) (HCC)    Hypertension    Moderate mitral regurgitation    Severe aortic stenosis    Past Surgical History:  Procedure Laterality Date   AORTIC VALVE  REPLACEMENT N/A 07/24/2017   Procedure: AORTIC VALVE REPLACEMENT (AVR) using 23mm Inspiris Aortic Valve;  Surgeon: Alleen Borne, MD;  Location: [facility] OR;  Service: Open Heart Surgery;  Laterality: N/A;   CLIPPING OF ATRIAL APPENDAGE N/A 07/24/2017   Procedure: CLIPPING OF ATRIAL APPENDAGE using a 50mm Atricure clip;  Surgeon: Alleen Borne, MD;  Location: [facility] OR;  Service: Open Heart Surgery;  Laterality: N/A;   CORONARY ARTERY BYPASS GRAFT N/A 07/24/2017   Procedure: CORONARY ARTERY BYPASS GRAFTING (CABG) times 3 using the left greater saphenous vein harvested endoscopically and left internal mammary artery.;  Surgeon: Alleen Borne, MD;  Location: [facility] OR;  Service: Open Heart Surgery;  Laterality: N/A;   EYE SURGERY Bilateral    lens replacements for cataracts   HERNIA REPAIR     INGUINAL HERNIA REPAIR Right 05/06/2015   Procedure: LAPAROSCOPIC RIGHT INGUINAL HERNIA WITH MESH;  Surgeon: Abigail Miyamoto, MD;  Location: [facility] ORS;  Service: General;  Laterality: Right;   INSERTION OF MESH Right 05/06/2015   Procedure: INSERTION OF MESH;  Surgeon: Abigail Miyamoto, MD;  Location: [facility] ORS;  Service: General;  Laterality: Right;   RIGHT/LEFT HEART CATH AND CORONARY ANGIOGRAPHY N/A 07/10/2017   Procedure: RIGHT/LEFT HEART CATH AND CORONARY ANGIOGRAPHY;  Surgeon: Elder Negus, MD;  Location: [facility] INVASIVE CV LAB;  Service: Cardiovascular;  Laterality: N/A;   TEE WITHOUT CARDIOVERSION N/A 07/11/2017   Procedure: TRANSESOPHAGEAL ECHOCARDIOGRAM (TEE);  Surgeon: Elder Negus, MD;  Location: [facility] ENDOSCOPY;  Service:  Cardiovascular;  Laterality: N/A;   TEE WITHOUT CARDIOVERSION N/A 07/24/2017   Procedure: TRANSESOPHAGEAL ECHOCARDIOGRAM (TEE);  Surgeon: Alleen Borne, MD;  Location: [facility] OR;  Service: Open Heart Surgery;  Laterality: N/A;   TOTAL KNEE ARTHROPLASTY Right 08/07/2019   Procedure: TOTAL KNEE ARTHROPLASTY;  Surgeon: Ollen Gross, MD;  Location: [facility] ORS;  Service: Orthopedics;  Laterality:  Right;   ULTRASOUND GUIDANCE FOR VASCULAR ACCESS  07/10/2017   Procedure: Ultrasound Guidance For Vascular Access;  Surgeon: Elder Negus, MD;  Location: [facility] INVASIVE CV LAB;  Service: Cardiovascular;;     A IV Location/Drains/Wounds Patient Lines/Drains/Airways Status     Active Line/Drains/Airways     Name Placement date Placement time Site Days   Peripheral IV 06/23/22 20 G Anterior;Right Forearm 06/23/22  2333  Forearm  1   Incision - 3 Ports Abdomen 1: Left;Lateral 2: Left;Lower;Lateral 3: Umbilicus 05/06/15  0830  -- 2606            Intake/Output Last 24 hours  Intake/Output Summary (Last 24 hours) at 06/24/2022 1132 Last data filed at 06/24/2022 0221 Gross per 24 hour  Intake 1000 ml  Output --  Net 1000 ml    Labs/Imaging Results for orders placed or performed during the hospital encounter of 06/23/22 (from the past 48 hour(s))  CBC with Differential     Status: Abnormal   Collection Time: 06/23/22 11:24 PM  Result Value Ref Range   WBC 10.1 4.0 - 10.5 K/uL   RBC 3.54 (L) 4.22 - 5.81 MIL/uL   Hemoglobin 11.4 (L) 13.0 - 17.0 g/dL   HCT 16.1 (L) 09.6 - 04.5 %   MCV 92.7 80.0 - 100.0 fL   MCH 32.2 26.0 - 34.0 pg   MCHC 34.8 30.0 - 36.0 g/dL   RDW 40.9 81.1 - 91.4 %   Platelets 167 150 - 400 K/uL   nRBC 0.0 0.0 - 0.2 %   Neutrophils Relative % 88 %   Neutro Abs 8.8 (H) 1.7 - 7.7 K/uL   Lymphocytes Relative 4 %   Lymphs Abs 0.4 (L) 0.7 - 4.0 K/uL   Monocytes Relative 8 %   Monocytes Absolute 0.9 0.1 - 1.0 K/uL   Eosinophils Relative 0 %   Eosinophils Absolute 0.0 0.0 - 0.5 K/uL   Basophils Relative 0 %   Basophils Absolute 0.0 0.0 - 0.1 K/uL   Immature Granulocytes 0 %   Abs Immature Granulocytes 0.04 0.00 - 0.07 K/uL    Comment: Performed at [facility], [address], [place], [place] [address]  Comprehensive metabolic panel     Status: Abnormal   Collection Time: 06/23/22 11:24 PM  Result Value Ref Range   Sodium 117 (LL) 135 - 145 mmol/L     Comment: CRITICAL RESULT CALLED TO, READ BACK BY AND VERIFIED WITH Jearld Pies, RN. [phone] 06/24/22. LPAIT   Potassium 4.0 3.5 - 5.1 mmol/L   Chloride 83 (L) 98 - 111 mmol/L   CO2 21 (L) 22 - 32 mmol/L   Glucose, Bld 107 (H) 70 - 99 mg/dL    Comment: Glucose reference range applies only to samples taken after fasting for at least 8 hours.   BUN 12 8 - 23 mg/dL   Creatinine, Ser 0.86 0.61 - 1.24 mg/dL   Calcium 8.6 (L) 8.9 - 10.3 mg/dL   Total Protein 6.1 (L) 6.5 - 8.1 g/dL   Albumin 3.0 (L) 3.5 - 5.0 g/dL   AST 19 15 - 41 U/L  ALT 21 0 - 44 U/L   Alkaline Phosphatase 50 38 - 126 U/L   Total Bilirubin 1.3 (H) 0.3 - 1.2 mg/dL   GFR, Estimated >40 >98 mL/min    Comment: (NOTE) Calculated using the CKD-EPI Creatinine Equation (2021)    Anion gap 13 5 - 15    Comment: Performed at [facility], [address], [place], [place] [address]  Urinalysis, w/ Reflex to Culture (Infection Suspected) -Urine, Clean Catch     Status: Abnormal   Collection Time: 06/23/22 11:26 PM  Result Value Ref Range   Specimen Source URINE, CLEAN CATCH    Color, Urine YELLOW YELLOW   APPearance CLEAR CLEAR   Specific Gravity, Urine 1.008 1.005 - 1.030   pH 6.0 5.0 - 8.0   Glucose, UA NEGATIVE NEGATIVE mg/dL   Hgb urine dipstick NEGATIVE NEGATIVE   Bilirubin Urine NEGATIVE NEGATIVE   Ketones, ur 20 (A) NEGATIVE mg/dL   Protein, ur NEGATIVE NEGATIVE mg/dL   Nitrite NEGATIVE NEGATIVE   Leukocytes,Ua NEGATIVE NEGATIVE   RBC / HPF 0-5 0 - 5 RBC/hpf   WBC, UA 0-5 0 - 5 WBC/hpf    Comment:        Reflex urine culture not performed if WBC <=10, OR if Squamous epithelial cells >5. If Squamous epithelial cells >5 suggest recollection.    Bacteria, UA RARE (A) NONE SEEN   Squamous Epithelial / HPF 0-5 0 - 5 /HPF   Mucus PRESENT     Comment: Performed at [facility], [address] [place], [place] [address]  Basic metabolic panel     Status: Abnormal   Collection Time: 06/24/22  6:25 AM  Result  Value Ref Range   Sodium 119 (LL) 135 - 145 mmol/L    Comment: CRITICAL RESULT CALLED TO, READ BACK BY AND VERIFIED WITH Surie Suchocki RN.@[email or web address]  ON 5.17.24 BY TCALDWELL MT.   Potassium 3.9 3.5 - 5.1 mmol/L   Chloride 87 (L) 98 - 111 mmol/L   CO2 22 22 - 32 mmol/L   Glucose, Bld 116 (H) 70 - 99 mg/dL    Comment: Glucose reference range applies only to samples taken after fasting for at least 8 hours.   BUN 10 8 - 23 mg/dL   Creatinine, Ser 6.21 0.61 - 1.24 mg/dL   Calcium 8.4 (L) 8.9 - 10.3 mg/dL   GFR, Estimated >30 >86 mL/min    Comment: (NOTE) Calculated using the CKD-EPI Creatinine Equation (2021)    Anion gap 10 5 - 15    Comment: Performed at [facility], [address], [place], [place] [address]  CBC     Status: Abnormal   Collection Time: 06/24/22  6:25 AM  Result Value Ref Range   WBC 9.4 4.0 - 10.5 K/uL   RBC 3.56 (L) 4.22 - 5.81 MIL/uL   Hemoglobin 11.4 (L) 13.0 - 17.0 g/dL   HCT 96.2 (L) 95.2 - 84.1 %   MCV 90.7 80.0 - 100.0 fL   MCH 32.0 26.0 - 34.0 pg   MCHC 35.3 30.0 - 36.0 g/dL   RDW 32.4 40.1 - 02.7 %   Platelets 171 150 - 400 K/uL   nRBC 0.0 0.0 - 0.2 %    Comment: Performed at [facility], [address] [place], [place] [address]   MR THORACIC SPINE WO CONTRAST  Result Date: 06/24/2022 CLINICAL DATA:  82 year old male with mid back pain. Evidence of mild lower thoracic spinal stenosis  on lumbar MRI yesterday. EXAM: MRI THORACIC SPINE WITHOUT CONTRAST TECHNIQUE: Multiplanar, multisequence MR imaging of the thoracic spine was performed. No intravenous contrast was administered. COMPARISON:  CT Abdomen and Pelvis 0044 hours today. Lumbar MRI yesterday. FINDINGS: Limited cervical spine imaging: Lower cervical spine advanced disc and endplate degeneration with mild suspected spondylolisthesis (series 16, image 6). Evidence of C5-C6 spinal stenosis. Thoracic spine segmentation: Appears normal, and concordant with the lumbar spine numbering yesterday.  Alignment: Mildly exaggerated upper thoracic kyphosis. Subtle anterolisthesis such as T2 on T3. Some straightening of the lower thoracic kyphosis. Mildly exaggerated kyphosis also at the thoracolumbar junction. No significant scoliosis. Vertebrae: Normal background bone marrow signal. Scattered chronic thoracic endplate degeneration and/or small Schmorl's nodes. No marrow edema or evidence of acute osseous abnormality. Visible posterior ribs appear intact. Cord: No thoracic spinal cord signal abnormality despite some levels of degenerative spinal cord mass effect which are detailed below. Conus medullaris appears spared at T12-L1. Paraspinal and other soft tissues: Dependent left lung opacity and small layering bilateral pleural effusions. Superimposed moderate gastric hiatal hernia. See CT Abdomen and Pelvis from earlier today. Negative other visible chest and upper abdominal viscera. Thoracic paraspinal soft tissues remain within normal limits. Disc levels: T1-T2: Minor disc bulging. Mild to moderate facet hypertrophy. No spinal stenosis. Moderate right T1 neural foraminal stenosis. T2-T3: Mild anterolisthesis with disc bulging. Mild to moderate facet hypertrophy greater on the right. No spinal stenosis. Moderate right T2 neural foraminal stenosis. T3-T4: Possible right side facet ankylosis at this level. Moderate facet hypertrophy there and moderate to severe right T3 neural foraminal stenosis. But no disc degeneration or spinal stenosis. T4-T5: Mild facet and ligament flavum hypertrophy. No significant stenosis. T5-T6: Mostly anterior disc bulging and endplate spurring. No stenosis. T6-T7: Circumferential disc bulge with a broad-based posterior component (series 22, image 20) effacing the ventral CSF space. Borderline to mild spinal stenosis when combined with mild facet and ligament flavum hypertrophy. Mild if any spinal cord mass effect. No foraminal stenosis. T7-T8: More focal left paracentral disc or disc  osteophyte complex on series 22, image 23. Mild posterior element hypertrophy. Borderline to mild spinal stenosis and mild left ventral cord mass effect. T8-T9: Small posterior disc bulge or protrusion on series 22, image 27. Mild posterior element hypertrophy. No significant stenosis. T9-T10: Circumferential disc bulging. Mild posterior element hypertrophy. Borderline to mild spinal stenosis. T10-T11: Disc bulging and small right paracentral disc or disc osteophyte component on series 22, image 32. Up to moderate posterior element hypertrophy. Mild spinal stenosis. Mild if any spinal cord mass effect. Up to mild left and mild to moderate right T10 neural foraminal stenosis. T11-T12: Circumferential disc bulge with larger right paracentral disc extrusion (series 19, image 10 and series 22, image 35) with mild posterior element hypertrophy. Mild spinal stenosis and ventral cord mass effect. No cord signal abnormality. No foraminal stenosis. T12-L1: Negative. IMPRESSION: 1. No acute osseous abnormality in the thoracic spine. 2. Widespread thoracic disc degeneration and posterior element hypertrophy. Small right paracentral disc extrusion at T11-T12, which perhaps is more acute than the remaining degenerative findings. Mild spinal stenosis and mild ventral spinal cord mass effect at that level. No cord signal abnormality. 3. Other mild multifactorial thoracic spinal stenosis T6-T7, T7-T8, T9-T10 and T10-T11. Mild additional cord mass effect but no thoracic cord signal abnormality. 4. Up to moderate occasional degenerative neural foraminal stenosis: Right T1, right T2, and right T10 nerve levels. 5. Partially visible advanced lower cervical spine degeneration with evidence of C5-C6  spinal stenosis. 6. Small layering pleural effusions and dependent left lung opacity. See CT Abdomen and Pelvis from earlier today. Electronically Signed   By: Odessa Fleming M.D.   On: 06/24/2022 04:50   CT ABDOMEN PELVIS W CONTRAST  Result  Date: 06/24/2022 CLINICAL DATA:  Bowel obstruction suspected With a history of gout, hypertension, CAD, CHF, hepatitis who presents to the ED for evaluation of low back pain. States this began abruptly 2 days ago. He was active, played a round of disc golf and went on 2 bike rides EXAM: CT ABDOMEN AND PELVIS WITH CONTRAST TECHNIQUE: Multidetector CT imaging of the abdomen and pelvis was performed using the standard protocol following bolus administration of intravenous contrast. RADIATION DOSE REDUCTION: This exam was performed according to the departmental dose-optimization program which includes automated exposure control, adjustment of the mA and/or kV according to patient size and/or use of iterative reconstruction technique. CONTRAST:  75mL OMNIPAQUE IOHEXOL 350 MG/ML SOLN COMPARISON:  None Available. FINDINGS: Lower chest: Moderate volume hiatal hernia. Coronary artery calcifications. Hepatobiliary: Indeterminate right posterior hepatic lobe 4.5 cm triangular hypodensity. Finding unchanged on delayed view. Hypodense lesion of the left hepatic lobe likely a simple hepatic cyst. Contracted gallbladder. No gallstones, gallbladder wall thickening, or pericholecystic fluid. No biliary dilatation. Pancreas: No focal lesion. Normal pancreatic contour. No surrounding inflammatory changes. No main pancreatic ductal dilatation. Spleen: Normal in size without focal abnormality. Adrenals/Urinary Tract: No adrenal nodule bilaterally. Nonspecific bilateral perinephric fat stranding. Bilateral kidneys enhance symmetrically. No hydronephrosis. No hydroureter. No nephroureterolithiasis. Fluid density lesion of the right kidney likely represents a simple renal cyst. Simple renal cysts, in the absence of clinically indicated signs/symptoms, require no independent follow-up. The urinary bladder is unremarkable. On delayed imaging, there is no urothelial wall thickening and there are no filling defects in the opacified portions of  the bilateral collecting systems or ureters. Stomach/Bowel: Stomach is within normal limits. Increased stool burden within the ascending colon. Stool throughout the transverse colon. Fecalized material within the distal small bowel lumen suggestive of an incompetent ileocecal valve versus slow transition. No evidence of bowel wall thickening or dilatation. Appendix appears normal. Vascular/Lymphatic: No abdominal aorta or iliac aneurysm. Severe atherosclerotic plaque of the aorta and its branches. No abdominal, pelvic, or inguinal lymphadenopathy. Reproductive: Prostate is unremarkable. Other: No intraperitoneal free fluid. No intraperitoneal free gas. No organized fluid collection. Musculoskeletal: CP. No suspicious lytic or blastic osseous lesions. No acute displaced fracture. Multilevel degenerative changes of the spine. IMPRESSION: 1. Moderate hiatal hernia. 2. Increased stool burden within the ascending colon and transverse colon. Correlate for constipation. 3. Indeterminate right posterior hepatic lobe 4.5 cm triangular hypodensity. Correlate with point tenderness to palpation and recent trauma-could possibly represent a hepatic laceration with no active bleed. Comparison with prior cross-sectional imaging would be of value. Electronically Signed   By: Tish Frederickson M.D.   On: 06/24/2022 01:26   MR Lumbar Spine Wo Contrast  Result Date: 06/24/2022 CLINICAL DATA:  Initial evaluation for low back pain, cauda equina syndrome suspected. EXAM: MRI LUMBAR SPINE WITHOUT CONTRAST TECHNIQUE: Multiplanar, multisequence MR imaging of the lumbar spine was performed. No intravenous contrast was administered. COMPARISON:  Radiograph from 06/23/2022 and MRI from 12/27/2011. FINDINGS: Segmentation: Standard. Lowest well-formed disc space labeled the L5-S1 level. Alignment: Trace grade 1 degenerative stepwise retrolisthesis of L1 on L2 through L3 on L4. Additional trace 3 mm retrolisthesis of L5 on S1. Vertebrae:  Vertebral body height maintained without acute or chronic fracture. Bone marrow signal intensity  within normal limits. No worrisome osseous lesions. Scattered reactive endplate changes with marrow edema present within the lumbar spine, most pronounced at L5-S1. Conus medullaris and cauda equina: Conus extends to the T12-L1 level. There is question of T2/STIR signal abnormality involving the distal cord at the level of T11-12 (series 6, image 9). This is not well seen on corresponding T2 weighted sequence, and could potentially be artifactual. Nerve roots of the cauda equina within normal limits. Paraspinal and other soft tissues: Paraspinous soft tissues within normal limits. 9 mm mildly complex cyst with intrinsic T1 hyperintensity partially visualize within the interpolar right kidney, indeterminate. Disc levels: T11-12: Seen only on sagittal projection. Disc desiccation with diffuse disc bulge. Small central disc protrusion with slight superior migration. Mild cord flattening without cord signal changes. No significant spinal stenosis. Foramina remain patent. T12-L1: Disc desiccation without significant disc bulge. No stenosis. L1-2: Degenerative intervertebral disc space narrowing with retrolisthesis. Diffuse disc bulge with reactive endplate spurring. Mild facet hypertrophy. Resultant mild bilateral subarticular stenosis. Central canal remains patent. Mild right L1 foraminal narrowing. Left neural foramen remains patent. L2-3: Degenerate intervertebral disc space narrowing with retrolisthesis. Diffuse disc bulge with reactive endplate spurring. Mild facet and ligament flavum hypertrophy. Resultant mild bilateral subarticular stenosis. Central canal remains patent. Mild to moderate bilateral L2 foraminal narrowing. L3-4: Degenerate intervertebral disc space narrowing with diffuse disc bulge and disc desiccation. Reactive endplate spurring. Superimposed left foraminal to extraforaminal disc protrusion contacts  the exiting left L3 nerve root. Mild bilateral facet hypertrophy. Resultant mild bilateral subarticular stenosis. Central canal remains patent. Mild to moderate bilateral L3 foraminal narrowing. L4-5: Degenerate intervertebral disc space narrowing with diffuse disc bulge. Reactive endplate spurring. Superimposed left foraminal disc protrusion contacts the exiting left L4 nerve root. Mild-to-moderate bilateral facet hypertrophy. Resultant mild-to-moderate canal with bilateral subarticular stenosis. Mild to moderate bilateral L4 foraminal narrowing. L5-S1: Degenerate intervertebral disc space narrowing with diffuse disc bulge and reactive endplate spurring. Moderate bilateral facet hypertrophy. Resultant moderate narrowing of the lateral recesses bilaterally. Central canal remains patent. Moderate bilateral L5 foraminal stenosis. IMPRESSION: 1. Question T2/STIR signal abnormality involving the distal cord at the level of T11-12. While this finding may be artifactual in nature, possible cord signal changes/edema is difficult to exclude. Further evaluation with dedicated MRI of the thoracic spine recommended for further evaluation. 2. Multilevel lumbar spondylosis with resultant mild to moderate diffuse lateral recess and spinal stenosis at L1-2 through L4-5 as above. No high-grade spinal stenosis within the lumbar spine itself. 3. Superimposed left foraminal to extraforaminal disc protrusions at L3-4 and L4-5, potentially affecting the exiting left L3 and L4 nerve roots respectively. 4. Mild to moderate bilateral L3 through L5 foraminal stenosis related to disc bulge, reactive endplate changes, and facet hypertrophy. 5. 9 mm mildly complex cyst with intrinsic T1 hyperintensity within the interpolar right kidney, indeterminate. Further evaluation with dedicated renal mass protocol CT and/or MRI recommended. This could be performed on a nonemergent outpatient basis. Electronically Signed   By: Rise Mu M.D.    On: 06/24/2022 01:24   DG Lumbar Spine Complete  Result Date: 06/23/2022 CLINICAL DATA:  Pain EXAM: LUMBAR SPINE - COMPLETE 4+ VIEW COMPARISON:  Lumbar spine x-ray 06/21/2022 FINDINGS: There is no evidence of lumbar spine fracture. Alignment is normal. There is moderate disc space narrowing at all levels of the lumbar spine with endplate osteophyte formation compatible with degenerative change. There is a very large amount of stool throughout the colon. There are atherosclerotic calcifications of the aorta.  IMPRESSION: 1. No acute fracture or dislocation. 2. Moderate degenerative changes throughout the lumbar spine. 3. Very large amount of stool throughout the colon. Electronically Signed   By: Darliss Cheney M.D.   On: 06/23/2022 20:39    Pending Labs Unresulted Labs (From admission, onward)     Start     Ordered   07/01/22 0500  Creatinine, serum  (enoxaparin (LOVENOX)    CrCl >/= 30 ml/min)  Weekly,   R     Comments: while on enoxaparin therapy    06/24/22 0359   06/25/22 0500  Magnesium  Tomorrow morning,   R        06/24/22 0949   06/24/22 0500  CBC  Daily,   R      06/24/22 0359   06/24/22 0349  Basic metabolic panel  Now then every 6 hours,   R (with TIMED occurrences)      06/24/22 0348   06/24/22 0349  Sodium, urine, random  Add-on,   AD        06/24/22 0348   06/24/22 0349  Osmolality, urine  Add-on,   AD        06/24/22 0348            Vitals/Pain Today's Vitals   06/24/22 1019 06/24/22 1100 06/24/22 1104 06/24/22 1127  BP:  (!) 154/87    Pulse:  91    Resp:  (!) 23    Temp:   98.4 F (36.9 C)   TempSrc:   Oral   SpO2:  100%    Weight:      Height:      PainSc: 0-No pain   4     Isolation Precautions No active isolations  Medications Medications  lidocaine (LIDODERM) 5 % 1 patch (1 patch Transdermal Patch Removed 06/24/22 1009)  enoxaparin (LOVENOX) injection 40 mg (40 mg Subcutaneous Given 06/24/22 1037)  sodium chloride flush (NS) 0.9 % injection 3 mL (3  mLs Intravenous Given 06/24/22 1041)  acetaminophen (TYLENOL) tablet 650 mg (has no administration in time range)    Or  acetaminophen (TYLENOL) suppository 650 mg (has no administration in time range)  oxyCODONE (Oxy IR/ROXICODONE) immediate release tablet 5 mg (has no administration in time range)  HYDROmorphone (DILAUDID) injection 0.5 mg (0.5 mg Intravenous Given 06/24/22 1036)  magnesium citrate solution 1 Bottle (has no administration in time range)  senna-docusate (Senokot-S) tablet 1 tablet (has no administration in time range)  0.9 %  sodium chloride infusion ( Intravenous New Bag/Given 06/24/22 1036)  methocarbamol (ROBAXIN) tablet 500 mg (has no administration in time range)  methocarbamol (ROBAXIN) tablet 500 mg (500 mg Oral Given 06/23/22 2057)  sodium chloride 0.9 % bolus 1,000 mL (0 mLs Intravenous Stopped 06/24/22 0221)  iohexol (OMNIPAQUE) 350 MG/ML injection 75 mL (75 mLs Intravenous Contrast Given 06/24/22 0050)  HYDROmorphone (DILAUDID) injection 0.5 mg (0.5 mg Intravenous Given 06/24/22 0212)  bisacodyl (DULCOLAX) suppository 10 mg (10 mg Rectal Given 06/24/22 1037)    Mobility walks with person assist     Focused Assessments Constipation. Passing gas and has bowel sounds in all 4 quadrants.    R Recommendations: See Admitting Provider Note  Report given to:   Additional Notes: Lying flat on back for pain relief.

## 2022-06-24 NOTE — ED Provider Notes (Signed)
  [place] [facility] DE[facility]MENT AT [facility] Provider Note      Procedures .Critical Care  Performed by: Roxy Horseman, PA-C Authorized by: Roxy Horseman, PA-C   Critical care provider statement:    Critical care time (minutes):  36   Critical care was necessary to treat or prevent imminent or life-threatening deterioration of the following conditions:  Metabolic crisis   Critical care was time spent personally by me on the following activities:  Development of treatment plan with patient or surrogate, discussions with consultants, evaluation of patient's response to treatment, examination of patient, ordering and review of laboratory studies, ordering and review of radiographic studies, ordering and performing treatments and interventions, pulse oximetry, re-evaluation of patient's condition and review of old charts      Roxy Horseman, PA-C 06/24/22 0439    Sabas Sous, MD 06/24/22 [phone]

## 2022-06-24 NOTE — ED Notes (Signed)
Patient transported to MRI 

## 2022-06-24 NOTE — ED Notes (Signed)
Pt has lower back pain. The only way he has relief is to lie on his back. When turning the pt, he will cry out, but is cooperative. Pt states he hasn't had a BM in 4 days. At baseline the pt is ambulatory and able to care for his ADLs. During this admission, he needs assistance with ADLs.

## 2022-06-24 NOTE — H&P (Signed)
History and Physical    Yasmin Zucca ZOX:[id number] DOB: 1940/08/01 DOA: 06/23/2022  PCP: Caesar Bookman, NP   Patient coming from: Home   Chief Complaint: Low back pain, constipation   HPI: Eban Bonkowski is a pleasant 82 y.o. male with medical history significant for CAD status post CABG in 2019, bioprosthetic aortic valve replacement, cardiomyopathy with recovered EF, and gout who presents to the emergency department for evaluation of low back pain and constipation.  Patient reports that he has been much more active than usual lately, went on to bike rides, played 18 holes of disc golf the following day, was moving boxes, and then woke the following morning (06/21/2022) with severe low back pain.  Pain radiates down the left leg.  He denies any weakness, numbness, loss of continence, fever, or chills.  He denies any abdominal pain, upper back pain, headache, nausea, vomiting, or confusion.  With his increased activity recently, he reports drinking much more water than he typically does.  ED Course: Upon arrival to the ED, patient is found to be afebrile and saturating well on room air with normal heart rate and stable blood pressure.  Labs are most notable for sodium 117.  MRI lumbar spine is notable for artifact versus cord edema at T11-T12, and mildly complex right renal cyst.  CT of the abdomen and pelvis demonstrates increased stool burden and right hepatic lobe hypodensity.  Patient was given a liter of normal saline, Lidoderm, Robaxin, and Dilaudid in the ED.  Review of Systems:  All other systems reviewed and apart from HPI, are negative.  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    HFrEF (heart failure with reduced ejection fraction) (HCC)    Hypertension    Moderate mitral regurgitation    Severe aortic stenosis     Past Surgical  History:  Procedure Laterality Date   AORTIC VALVE REPLACEMENT N/A 07/24/2017   Procedure: AORTIC VALVE REPLACEMENT (AVR) using 23mm Inspiris Aortic Valve;  Surgeon: Alleen Borne, MD;  Location: [facility] OR;  Service: Open Heart Surgery;  Laterality: N/A;   CLIPPING OF ATRIAL APPENDAGE N/A 07/24/2017   Procedure: CLIPPING OF ATRIAL APPENDAGE using a 50mm Atricure clip;  Surgeon: Alleen Borne, MD;  Location: [facility] OR;  Service: Open Heart Surgery;  Laterality: N/A;   CORONARY ARTERY BYPASS GRAFT N/A 07/24/2017   Procedure: CORONARY ARTERY BYPASS GRAFTING (CABG) times 3 using the left greater saphenous vein harvested endoscopically and left internal mammary artery.;  Surgeon: Alleen Borne, MD;  Location: [facility] OR;  Service: Open Heart Surgery;  Laterality: N/A;   EYE SURGERY Bilateral    lens replacements for cataracts   HERNIA REPAIR     INGUINAL HERNIA REPAIR Right 05/06/2015   Procedure: LAPAROSCOPIC RIGHT INGUINAL HERNIA WITH MESH;  Surgeon: Abigail Miyamoto, MD;  Location: [facility] ORS;  Service: General;  Laterality: Right;   INSERTION OF MESH Right 05/06/2015   Procedure: INSERTION OF MESH;  Surgeon: Abigail Miyamoto, MD;  Location: [facility] ORS;  Service: General;  Laterality: Right;   RIGHT/LEFT HEART CATH AND CORONARY ANGIOGRAPHY N/A 07/10/2017   Procedure: RIGHT/LEFT HEART CATH AND CORONARY ANGIOGRAPHY;  Surgeon: Elder Negus, MD;  Location: [facility] INVASIVE CV LAB;  Service: Cardiovascular;  Laterality: N/A;   TEE WITHOUT CARDIOVERSION N/A 07/11/2017  Procedure: TRANSESOPHAGEAL ECHOCARDIOGRAM (TEE);  Surgeon: Elder Negus, MD;  Location: [facility] ENDOSCOPY;  Service: Cardiovascular;  Laterality: N/A;   TEE WITHOUT CARDIOVERSION N/A 07/24/2017   Procedure: TRANSESOPHAGEAL ECHOCARDIOGRAM (TEE);  Surgeon: Alleen Borne, MD;  Location: [facility] OR;  Service: Open Heart Surgery;  Laterality: N/A;   TOTAL KNEE ARTHROPLASTY Right 08/07/2019   Procedure: TOTAL KNEE ARTHROPLASTY;  Surgeon: Ollen Gross, MD;   Location: [facility] ORS;  Service: Orthopedics;  Laterality: Right;   ULTRASOUND GUIDANCE FOR VASCULAR ACCESS  07/10/2017   Procedure: Ultrasound Guidance For Vascular Access;  Surgeon: Elder Negus, MD;  Location: [facility] INVASIVE CV LAB;  Service: Cardiovascular;;    Social History:   reports that he quit smoking about 51 years ago. His smoking use included cigarettes. He has a 12.00 pack-year smoking history. He has never used smokeless tobacco. He reports current alcohol use. He reports current drug use. Drug: Marijuana.  Allergies  Allergen Reactions   Sulfamethoxazole-Trimethoprim Rash    Family History  Problem Relation Age of Onset   Stroke Mother    Heart attack Father      Prior to Admission medications   Medication Sig Start Date End Date Taking? Authorizing Provider  acetaminophen (TYLENOL) 500 MG tablet Take 2 tablets (1,000 mg total) by mouth every 6 (six) hours as needed for mild pain or fever. 07/29/17   Barrett, Erin R, PA-C  amLODipine (NORVASC) 5 MG tablet Take 5 mg by mouth daily.    [provider]  ascorbic acid (VITAMIN C) 500 MG tablet Take 1 tablet (500 mg total) by mouth daily. 03/25/22   Ngetich, Dinah C, NP  aspirin EC 81 MG tablet Take 81 mg by mouth daily. Swallow whole.    [provider]  Baclofen 5 MG TABS Take 1 tablet (5 mg total) by mouth 2 (two) times daily as needed. 06/21/22   Raspet, Noberto Retort, PA-C  Cetirizine HCl (ZYRTEC ALLERGY) 10 MG CAPS  05/09/22   [provider]  lidocaine (LIDODERM) 5 % Place 1 patch onto the skin daily. Remove & Discard patch within 12 hours or as directed by MD 06/21/22   Raspet, Noberto Retort, PA-C  Multiple Vitamin (MULTIVITAMIN ADULT PO) Take 1 tablet by mouth daily.    [provider]  Omega-3 Fatty Acids (FISH OIL) 1000 MG CAPS Take 2 capsules by mouth daily.    [provider]  Phenylephrine HCl (PREPARATION H RE) Place rectally as needed.    [provider]  psyllium (METAMUCIL)  58.6 % powder Take 1 packet by mouth daily as needed (regularity (3-4 times a week)).     [provider]  silodosin (RAPAFLO) 8 MG CAPS capsule Take 8 mg by mouth daily. 11/15/21   [provider]    Physical Exam: Vitals:   06/24/22 0022 06/24/22 0200 06/24/22 0230 06/24/22 0300  BP:  (!) 139/114 129/67 117/69  Pulse:  82 78 78  Resp:  17 10 12   Temp: 98.9 F (37.2 C)     TempSrc:      SpO2:  99% 94% 96%  Weight:      Height:         Constitutional: NAD, calm  Eyes: PERTLA, lids and conjunctivae normal ENMT: Mucous membranes are moist. Posterior pharynx clear of any exudate or lesions.   Neck: supple, no masses  Respiratory: no wheezing, no crackles. No accessory muscle use.  Cardiovascular: S1 & S2 heard, regular rate and rhythm. No extremity edema.  Abdomen: No distension, no tenderness, soft. Bowel sounds active.  Musculoskeletal: no clubbing / cyanosis. No joint deformity upper and lower extremities.   Skin: no significant rashes, lesions, ulcers. Warm, dry, well-perfused. Neurologic: Hearing deficit, CN 2-12 grossly intact otherwise. Sensation intact. Strength 5/5 in all 4 limbs. Alert and oriented.  Psychiatric: Pleasant. Cooperative.    Labs and Imaging on Admission: I have personally reviewed following labs and imaging studies  CBC: Recent Labs  Lab 06/23/22 2324  WBC 10.1  NEUTROABS 8.8*  HGB 11.4*  HCT 32.8*  MCV 92.7  PLT 167   Basic Metabolic Panel: Recent Labs  Lab 06/23/22 2324  NA 117*  K 4.0  CL 83*  CO2 21*  GLUCOSE 107*  BUN 12  CREATININE 0.76  CALCIUM 8.6*   GFR: Estimated Creatinine Clearance: 79.2 mL/min (by C-G formula based on SCr of 0.76 mg/dL). Liver Function Tests: Recent Labs  Lab 06/23/22 2324  AST 19  ALT 21  ALKPHOS 50  BILITOT 1.3*  PROT 6.1*  ALBUMIN 3.0*   No results for input(s): "LIPASE", "AMYLASE" in the last 168 hours. No results for input(s): "AMMONIA" in the last 168 hours. Coagulation  Profile: No results for input(s): "INR", "PROTIME" in the last 168 hours. Cardiac Enzymes: No results for input(s): "CKTOTAL", "CKMB", "CKMBINDEX", "TROPONINI" in the last 168 hours. BNP (last 3 results) No results for input(s): "PROBNP" in the last 8760 hours. HbA1C: No results for input(s): "HGBA1C" in the last 72 hours. CBG: No results for input(s): "GLUCAP" in the last 168 hours. Lipid Profile: No results for input(s): "CHOL", "HDL", "LDLCALC", "TRIG", "CHOLHDL", "LDLDIRECT" in the last 72 hours. Thyroid Function Tests: No results for input(s): "TSH", "T4TOTAL", "FREET4", "T3FREE", "THYROIDAB" in the last 72 hours. Anemia Panel: No results for input(s): "VITAMINB12", "FOLATE", "FERRITIN", "TIBC", "IRON", "RETICCTPCT" in the last 72 hours. Urine analysis:    Component Value Date/Time   COLORURINE YELLOW 06/23/2022 2326   APPEARANCEUR CLEAR 06/23/2022 2326   LABSPEC 1.008 06/23/2022 2326   PHURINE 6.0 06/23/2022 2326   GLUCOSEU NEGATIVE 06/23/2022 2326   HGBUR NEGATIVE 06/23/2022 2326   BILIRUBINUR NEGATIVE 06/23/2022 2326   KETONESUR 20 (A) 06/23/2022 2326   PROTEINUR NEGATIVE 06/23/2022 2326   NITRITE NEGATIVE 06/23/2022 2326   LEUKOCYTESUR NEGATIVE 06/23/2022 2326   Sepsis Labs: @LABRCNTIP (procalcitonin:4,lacticidven:4) )No results found for this or any previous visit (from the past 240 hour(s)).   Radiological Exams on Admission: MR Lumbar Spine Wo Contrast  Result Date: 06/24/2022 CLINICAL DATA:  Initial evaluation for low back pain, cauda equina syndrome suspected. EXAM: MRI LUMBAR SPINE WITHOUT CONTRAST TECHNIQUE: Multiplanar, multisequence MR imaging of the lumbar spine was performed. No intravenous contrast was administered. COMPARISON:  Radiograph from 06/23/2022 and MRI from 12/27/2011. FINDINGS: Segmentation: Standard. Lowest well-formed disc space labeled the L5-S1 level. Alignment: Trace grade 1 degenerative stepwise retrolisthesis of L1 on L2 through L3 on L4.  Additional trace 3 mm retrolisthesis of L5 on S1. Vertebrae: Vertebral body height maintained without acute or chronic fracture. Bone marrow signal intensity within normal limits. No worrisome osseous lesions. Scattered reactive endplate changes with marrow edema present within the lumbar spine, most pronounced at L5-S1. Conus medullaris and cauda equina: Conus extends to the T12-L1 level. There is question of T2/STIR signal abnormality involving the distal cord at the level of T11-12 (series 6, image 9). This is not well seen on corresponding T2 weighted sequence, and could potentially be artifactual. Nerve roots of the cauda equina within normal limits. Paraspinal and  other soft tissues: Paraspinous soft tissues within normal limits. 9 mm mildly complex cyst with intrinsic T1 hyperintensity partially visualize within the interpolar right kidney, indeterminate. Disc levels: T11-12: Seen only on sagittal projection. Disc desiccation with diffuse disc bulge. Small central disc protrusion with slight superior migration. Mild cord flattening without cord signal changes. No significant spinal stenosis. Foramina remain patent. T12-L1: Disc desiccation without significant disc bulge. No stenosis. L1-2: Degenerative intervertebral disc space narrowing with retrolisthesis. Diffuse disc bulge with reactive endplate spurring. Mild facet hypertrophy. Resultant mild bilateral subarticular stenosis. Central canal remains patent. Mild right L1 foraminal narrowing. Left neural foramen remains patent. L2-3: Degenerate intervertebral disc space narrowing with retrolisthesis. Diffuse disc bulge with reactive endplate spurring. Mild facet and ligament flavum hypertrophy. Resultant mild bilateral subarticular stenosis. Central canal remains patent. Mild to moderate bilateral L2 foraminal narrowing. L3-4: Degenerate intervertebral disc space narrowing with diffuse disc bulge and disc desiccation. Reactive endplate spurring. Superimposed  left foraminal to extraforaminal disc protrusion contacts the exiting left L3 nerve root. Mild bilateral facet hypertrophy. Resultant mild bilateral subarticular stenosis. Central canal remains patent. Mild to moderate bilateral L3 foraminal narrowing. L4-5: Degenerate intervertebral disc space narrowing with diffuse disc bulge. Reactive endplate spurring. Superimposed left foraminal disc protrusion contacts the exiting left L4 nerve root. Mild-to-moderate bilateral facet hypertrophy. Resultant mild-to-moderate canal with bilateral subarticular stenosis. Mild to moderate bilateral L4 foraminal narrowing. L5-S1: Degenerate intervertebral disc space narrowing with diffuse disc bulge and reactive endplate spurring. Moderate bilateral facet hypertrophy. Resultant moderate narrowing of the lateral recesses bilaterally. Central canal remains patent. Moderate bilateral L5 foraminal stenosis. IMPRESSION: 1. Question T2/STIR signal abnormality involving the distal cord at the level of T11-12. While this finding may be artifactual in nature, possible cord signal changes/edema is difficult to exclude. Further evaluation with dedicated MRI of the thoracic spine recommended for further evaluation. 2. Multilevel lumbar spondylosis with resultant mild to moderate diffuse lateral recess and spinal stenosis at L1-2 through L4-5 as above. No high-grade spinal stenosis within the lumbar spine itself. 3. Superimposed left foraminal to extraforaminal disc protrusions at L3-4 and L4-5, potentially affecting the exiting left L3 and L4 nerve roots respectively. 4. Mild to moderate bilateral L3 through L5 foraminal stenosis related to disc bulge, reactive endplate changes, and facet hypertrophy. 5. 9 mm mildly complex cyst with intrinsic T1 hyperintensity within the interpolar right kidney, indeterminate. Further evaluation with dedicated renal mass protocol CT and/or MRI recommended. This could be performed on a nonemergent outpatient  basis. Electronically Signed   By: Rise Mu M.D.   On: 06/24/2022 01:24   DG Lumbar Spine Complete  Result Date: 06/23/2022 CLINICAL DATA:  Pain EXAM: LUMBAR SPINE - COMPLETE 4+ VIEW COMPARISON:  Lumbar spine x-ray 06/21/2022 FINDINGS: There is no evidence of lumbar spine fracture. Alignment is normal. There is moderate disc space narrowing at all levels of the lumbar spine with endplate osteophyte formation compatible with degenerative change. There is a very large amount of stool throughout the colon. There are atherosclerotic calcifications of the aorta. IMPRESSION: 1. No acute fracture or dislocation. 2. Moderate degenerative changes throughout the lumbar spine. 3. Very large amount of stool throughout the colon. Electronically Signed   By: Darliss Cheney M.D.   On: 06/23/2022 20:39    Assessment/Plan   1. Hyponatremia    - He appears euvolemic and reports drinking a lot of water recently  - Appears to be asymptomatic  - He has already received 1 liter NS in ED  -  Restrict free water intake, repeat sodium level now to guide choice of IVF going forward, follow sodium levels q6h with goal increase of 4-5 mmol/L in 24 hrs   2. Low back pain - No cauda equina s/s or neuro deficits   - No emergent lumbar spine findings on MRI  - Likely d/t recent increase in physical activity  - Continue multimodal pain-control    3. Constipation  - Start bowel regimen    4. Chronic diastolic CHF  - EF improved to 50-55% on echo from February 2023 per cardiology notes  - Appears compensated   - Monitor weight and I/Os   5. Right renal cyst  - Outpatient follow-up for further imaging recommended     DVT prophylaxis: Lovenox  Code Status: Full  Level of Care: Level of care: Progressive Family Communication: none present  Disposition Plan:  Patient is from: Home  Anticipated d/c is to: TBD Anticipated d/c date is: 06/26/22  Patient currently: Pending MRI thoracic spine, improved sodium  level, pain-control  Consults called: None  Admission status: Inpatient     Briscoe Deutscher, MD [facility]  06/24/2022, 3:59 AM

## 2022-06-24 NOTE — ED Notes (Signed)
Patient transported to CT 

## 2022-06-25 DIAGNOSIS — E871 Hypo-osmolality and hyponatremia: Secondary | ICD-10-CM | POA: Diagnosis not present

## 2022-06-25 LAB — CBC
HCT: 28.5 % — ABNORMAL LOW (ref 39.0–52.0)
HCT: 29 % — ABNORMAL LOW (ref 39.0–52.0)
Hemoglobin: 10.3 g/dL — ABNORMAL LOW (ref 13.0–17.0)
Hemoglobin: 10.3 g/dL — ABNORMAL LOW (ref 13.0–17.0)
MCH: 32.2 pg (ref 26.0–34.0)
MCH: 31.6 pg (ref 26.0–34.0)
MCHC: 36.1 g/dL — ABNORMAL HIGH (ref 30.0–36.0)
MCHC: 35.5 g/dL (ref 30.0–36.0)
MCV: 89.1 fL (ref 80.0–100.0)
MCV: 89 fL (ref 80.0–100.0)
Platelets: 173 10*3/uL (ref 150–400)
Platelets: 183 10*3/uL (ref 150–400)
RBC: 3.2 MIL/uL — ABNORMAL LOW (ref 4.22–5.81)
RBC: 3.26 MIL/uL — ABNORMAL LOW (ref 4.22–5.81)
RDW: 11.7 % (ref 11.5–15.5)
RDW: 11.9 % (ref 11.5–15.5)
WBC: 10.2 10*3/uL (ref 4.0–10.5)
WBC: 10.3 10*3/uL (ref 4.0–10.5)
nRBC: 0 % (ref 0.0–0.2)
nRBC: 0 % (ref 0.0–0.2)

## 2022-06-25 LAB — BASIC METABOLIC PANEL
Anion gap: 9 (ref 5–15)
BUN: 11 mg/dL (ref 8–23)
CO2: 23 mmol/L (ref 22–32)
Calcium: 8.2 mg/dL — ABNORMAL LOW (ref 8.9–10.3)
Chloride: 89 mmol/L — ABNORMAL LOW (ref 98–111)
Creatinine, Ser: 0.77 mg/dL (ref 0.61–1.24)
GFR, Estimated: >60 mL/min (ref >60)
Glucose, Bld: 111 mg/dL — ABNORMAL HIGH (ref 70–99)
Potassium: 4.4 mmol/L (ref 3.5–5.1)
Sodium: 121 mmol/L — ABNORMAL LOW (ref 135–145)

## 2022-06-25 LAB — SODIUM, URINE, RANDOM: Sodium, Ur: 23 mmol/L

## 2022-06-25 LAB — MAGNESIUM
Magnesium: 1.9 mg/dL (ref 1.7–2.4)
Magnesium: 1.9 mg/dL (ref 1.7–2.4)

## 2022-06-25 LAB — OSMOLALITY, URINE: Osmolality, Ur: 537 mOsm/kg (ref 300–900)

## 2022-06-25 LAB — OSMOLALITY: Osmolality: 259 mOsm/kg — ABNORMAL LOW (ref 275–295)

## 2022-06-25 LAB — BRAIN NATRIURETIC PEPTIDE: B Natriuretic Peptide: 878.6 pg/mL — ABNORMAL HIGH (ref 0.0–100.0)

## 2022-06-25 LAB — URIC ACID: Uric Acid, Serum: 4.3 mg/dL (ref 3.7–8.6)

## 2022-06-25 MED ORDER — ASPIRIN 81 MG PO TBEC
81.0000 mg | DELAYED_RELEASE_TABLET | Freq: Every day | ORAL | Status: DC
  Administered 2022-06-25 – 2022-06-28 (×4): 81 mg via ORAL
  Filled 2022-06-25 (×4): qty 1

## 2022-06-25 MED ORDER — SODIUM CHLORIDE 0.9 % IV SOLN: INTRAVENOUS | Status: DC

## 2022-06-25 MED ORDER — TAMSULOSIN HCL 0.4 MG PO CAPS
0.4000 mg | ORAL_CAPSULE | Freq: Every day | ORAL | Status: DC
  Administered 2022-06-25 – 2022-06-27 (×3): 0.4 mg via ORAL
  Filled 2022-06-25 (×3): qty 1

## 2022-06-25 MED ORDER — LACTULOSE 10 GM/15ML PO SOLN
30.0000 g | Freq: Three times a day (TID) | ORAL | Status: DC
  Administered 2022-06-25 (×2): 30 g via ORAL
  Filled 2022-06-25 (×2): qty 60

## 2022-06-25 MED ORDER — MAGNESIUM HYDROXIDE 400 MG/5ML PO SUSP
30.0000 mL | Freq: Two times a day (BID) | ORAL | Status: AC
  Administered 2022-06-25 (×2): 30 mL via ORAL
  Filled 2022-06-25 (×2): qty 30

## 2022-06-25 MED ORDER — DEXAMETHASONE SODIUM PHOSPHATE 10 MG/ML IJ SOLN
6.0000 mg | INTRAMUSCULAR | Status: DC
  Administered 2022-06-25: 6 mg via INTRAVENOUS
  Filled 2022-06-25: qty 1

## 2022-06-25 MED ORDER — DOCUSATE SODIUM 100 MG PO CAPS
200.0000 mg | ORAL_CAPSULE | Freq: Two times a day (BID) | ORAL | Status: DC
  Administered 2022-06-25 – 2022-06-28 (×4): 200 mg via ORAL
  Filled 2022-06-25 (×4): qty 2

## 2022-06-25 NOTE — Plan of Care (Signed)
  Problem: Clinical Measurements: Goal: Will remain free from infection Outcome: Progressing Goal: Respiratory complications will improve Outcome: Progressing   Problem: Coping: Goal: Level of anxiety will decrease Outcome: Progressing   Problem: Elimination: Goal: Will not experience complications related to bowel motility Outcome: Progressing   Problem: Pain Managment: Goal: General experience of comfort will improve Outcome: Progressing   Problem: Safety: Goal: Ability to remain free from injury will improve Outcome: Progressing   Problem: Skin Integrity: Goal: Risk for impaired skin integrity will decrease Outcome: Progressing

## 2022-06-25 NOTE — Evaluation (Signed)
Physical Therapy Evaluation  Patient Details Name: [name] MRN: [id number] DOB: [birth year] Today's Date: 06/25/2022  History of Present Illness  Rayn Amacker is a 82 y.o. male who presents with low back pain and constipation. Labs are most notable for sodium 117. CT of the abdomen and pelvis demonstrates increased stool burden and right hepatic lobe hypodensity.  MRI lumbar spine is notable for artifact versus cord edema at T11-T12.  PMHx: CAD status post CABG in 2019, bioprosthetic aortic valve replacement, cardiomyopathy with recovered EF and gout.   Clinical Impression  Pt admitted with above diagnosis. Pt currently with functional limitations due to the deficits listed below (see PT Problem List). At the time of PT eval pt was able to perform transfers and ambulation with up to +2 mod assist. Pt with increased pain during transfers but able to ambulate short distance once standing. Anticipate pt will progress well as pain improves. Pt will benefit from acute skilled PT to increase their independence and safety with mobility to allow discharge.          Recommendations for follow up therapy are one component of a multi-disciplinary discharge planning process, led by the attending physician.  Recommendations may be updated based on patient status, additional functional criteria and insurance authorization.  Follow Up Recommendations       Assistance Recommended at Discharge Other (comment)  Patient can return home with the following  A lot of help with walking and/or transfers;A little help with bathing/dressing/bathroom;Assistance with cooking/housework;Assist for transportation;Help with stairs or ramp for entrance    Equipment Recommendations    Recommendations for Other Services       Functional Status Assessment Patient has had a recent decline in their functional status and demonstrates the ability to make significant improvements in function in a reasonable and  predictable amount of time.     Precautions / Restrictions Precautions Precautions: Fall Restrictions Weight Bearing Restrictions: No      Mobility  Bed Mobility Overal bed mobility: Needs Assistance Bed Mobility: Sidelying to Sit, Rolling Rolling: Mod assist Sidelying to sit: Mod assist, +2 for physical assistance       General bed mobility comments: Pt with excessive grip on therapist's arm. Calling out in pain but with minimal effort to elevate trunk to full sitting position. PT in front and tech in back to complete transfer.    Transfers Overall transfer level: Needs assistance Equipment used: Rolling walker (2 wheels) Transfers: Sit to/from Stand Sit to Stand: Min assist           General transfer comment: Bed height elevated and pt was able to power up to full stand with heavy min assist.    Ambulation/Gait Ambulation/Gait assistance: Min guard, +2 safety/equipment Gait Distance (Feet): 30 Feet Assistive device: Rolling walker (2 wheels) Gait Pattern/deviations: Step-to pattern, Step-through pattern, Decreased stride length, Trunk flexed, Narrow base of support Gait velocity: Decreased Gait velocity interpretation: <1.31 ft/sec, indicative of household ambulator   General Gait Details: VC's for improved posture and forward gaze. Step-to grossly with occasional step-through gait pattern. Chair follow utilized. Pt only able to take 1 step with R and L before requiring standing rest break.  Stairs            Wheelchair Mobility    Modified Rankin (Stroke Patients Only)       Balance Overall balance assessment: Needs assistance Sitting-balance support: Feet supported, Single extremity supported Sitting balance-Leahy Scale: Fair Sitting balance - Comments: needs at least 1 UE supported  to offload back                                     Pertinent Vitals/Pain Pain Assessment Pain Assessment: Faces Faces Pain Scale: Hurts whole  lot Pain Location: back with movement Pain Descriptors / Indicators: Discomfort, Grimacing, Guarding, Moaning, Spasm Pain Intervention(s): Limited activity within patient's tolerance, Monitored during session, Repositioned    Home Living Family/patient expects to be discharged to:: Private residence Living Arrangements: Alone Available Help at Discharge: Family;Friend(s);Available PRN/intermittently Type of Home: Apartment Home Access: Stairs to enter Entrance Stairs-Rails: Right;Left (back) Entrance Stairs-Number of Steps: 3 "small" steps   Home Layout: One level Home Equipment: Rollator (4 wheels);BSC/3in1;Shower seat Additional Comments: pt reports his wife passed 1 month ago    Prior Function Prior Level of Function : Independent/Modified Independent             Mobility Comments: very active at baseline ADLs Comments: indep for all ADL/IADLs     Hand Dominance   Dominant Hand: Right    Extremity/Trunk Assessment   Upper Extremity Assessment Upper Extremity Assessment: Overall WFL for tasks assessed    Lower Extremity Assessment Lower Extremity Assessment: Overall WFL for tasks assessed (Mildly limited due to spasms in back with knee "buckling". Pt reports knees buckle but appears controlled.)    Cervical / Trunk Assessment Cervical / Trunk Assessment: Other exceptions Cervical / Trunk Exceptions: LBP  Communication   Communication: HOH  Cognition Arousal/Alertness: Awake/alert Behavior During Therapy: WFL for tasks assessed/performed Overall Cognitive Status: Within Functional Limits for tasks assessed                                 General Comments: overall WFL. anxious in anticipation of pain        General Comments      Exercises     Assessment/Plan    PT Assessment Patient needs continued PT services  PT Problem List Decreased strength;Decreased activity tolerance;Decreased balance;Decreased mobility;Decreased knowledge of use  of DME;Decreased safety awareness;Decreased knowledge of precautions;Pain       PT Treatment Interventions DME instruction;Gait training;Stair training;Functional mobility training;Therapeutic activities;Therapeutic exercise;Balance training;Patient/family education    PT Goals (Current goals can be found in the Care Plan section)  Acute Rehab PT Goals Patient Stated Goal: return home PT Goal Formulation: With patient Time For Goal Achievement: 07/02/22 Potential to Achieve Goals: Good    Frequency Min 3X/week     Co-evaluation               AM-PAC PT "6 Clicks" Mobility  Outcome Measure Help needed turning from your back to your side while in a flat bed without using bedrails?: A Lot Help needed moving from lying on your back to sitting on the side of a flat bed without using bedrails?: A Lot Help needed moving to and from a bed to a chair (including a wheelchair)?: A Little Help needed standing up from a chair using your arms (e.g., wheelchair or bedside chair)?: A Little Help needed to walk in hospital room?: A Little Help needed climbing 3-5 steps with a railing? : A Lot 6 Click Score: 15    End of Session Equipment Utilized During Treatment: Gait belt Activity Tolerance: Patient tolerated treatment well Patient left: in chair;with call bell/phone within reach;with chair alarm set;with family/visitor present Nurse Communication: Mobility status PT  Visit Diagnosis: Unsteadiness on feet (R26.81);Pain Pain - part of body:  (back)    Time: 2956-2130 PT Time Calculation (min) (ACUTE ONLY): 32 min   Charges:   PT Evaluation $PT Eval Low Complexity: 1 Low PT Treatments $Gait Training: 8-22 mins        Conni Slipper, PT, DPT Acute Rehabilitation Services Secure Chat Preferred Office: [phone]   Marylynn Pearson 06/25/2022, 1:14 PM

## 2022-06-25 NOTE — Progress Notes (Signed)
PROGRESS NOTE                                                                                                                                                                                                             Patient Demographics:    [name], is a 82 y.o. male, DOB - 06/13/1940, ONG:[id number]  Outpatient Primary MD for the patient is Ngetich, Dinah C, NP    LOS - 1  Admit date - 06/23/2022    Chief Complaint  Patient presents with   Back Pain   Constipation       Brief Narrative (HPI from H&P)   82 y.o. male with medical history significant for CAD status post CABG in 2019, bioprosthetic aortic valve replacement, cardiomyopathy with recovered EF, and gout who presents to the emergency department for evaluation of low back pain and constipation.    Subjective:    [name] today has, No headache, No chest pain, No abdominal pain - No Nausea, No new weakness tingling or numbness, no SOB.   Assessment  & Plan :    Severe hyponatremia.  Appears to be clinically dehydrated with excessive free water intake on top of intravascular depletion.  Improving with IV fluids continue, restrict free water intake.  Monitor.    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  He has no focal neurological deficits, no signs suggestive of cauda equina, will give trial of IV steroids, review images with neurosurgery, supportive care, PT-OT and monitor.   Incidental finding of 4.5 cm liver hypodensity.  No tenderness on palpation.  Outpatient follow-up with PCP and gastroenterology.  Incidental renal cyst noted on MRI.  Outpatient dedicated CT renal protocol per PCP.  Constipation.  Severe.  Placed on bowel regimen.       Condition -   Guarded  Family Communication  :  None present  Code Status :   Full  Consults  : Reviewed T and L-spine images with neurosurgery on-call, Clydell Hakim.  Outpatient follow-up.  Agrees with Decadron.  PUD Prophylaxis :    Procedures  :     CT - 1. Moderate hiatal hernia. 2. Increased stool burden within the ascending colon and transverse colon. Correlate for constipation. 3. Indeterminate  right posterior hepatic lobe 4.5 cm triangular hypodensity. Correlate with point tenderness to palpation and recent trauma-could possibly represent a hepatic laceration with no active bleed. Comparison with prior cross-sectional imaging would be of value.  MRI L Spine - 1. Question T2/STIR signal abnormality involving the distal cord at the level of T11-12. While this finding may be artifactual in nature, possible cord signal changes/edema is difficult to exclude. Further evaluation with dedicated MRI of the thoracic spine recommended for further evaluation. 2. Multilevel lumbar spondylosis with resultant mild to moderate diffuse lateral recess and spinal stenosis at L1-2 through L4-5 as above. No high-grade spinal stenosis within the lumbar spine itself. 3. Superimposed left foraminal to extraforaminal disc protrusions at L3-4 and L4-5, potentially affecting the exiting left L3 and L4 nerve roots respectively. 4. Mild to moderate bilateral L3 through L5 foraminal stenosis related to disc bulge, reactive endplate changes, and facet hypertrophy. 5. 9 mm mildly complex cyst with intrinsic T1 hyperintensity within the interpolar right kidney, indeterminate. Further evaluation with dedicated renal mass protocol CT and/or MRI recommended. This could be performed on a nonemergent outpatient basis.   MRI T Spine - 1. No acute osseous abnormality in the thoracic spine. 2. Widespread thoracic disc degeneration and posterior element hypertrophy. Small right paracentral disc extrusion at T11-T12, which perhaps is more acute than the remaining degenerative findings. Mild spinal stenosis  and mild ventral spinal cord mass effect at that level. No cord signal abnormality. 3. Other mild multifactorial thoracic spinal stenosis T6-T7, T7-T8, T9-T10 and T10-T11. Mild additional cord mass effect but no thoracic cord signal abnormality. 4. Up to moderate occasional degenerative neural foraminal stenosis: Right T1, right T2, and right T10 nerve levels. 5. Partially visible advanced lower cervical spine degeneration with evidence of C5-C6 spinal stenosis. 6. Small layering pleural effusions and dependent left lung opacity. See CT Abdomen and Pelvis from earlier today.       Disposition Plan  :    Status is: Inpatient   DVT Prophylaxis  :    enoxaparin (LOVENOX) injection 40 mg Start: 06/24/22 1000    Lab Results  Component Value Date   PLT 183 06/25/2022    Diet :  Diet Order             Diet vegetarian Room service appropriate? Yes; Fluid consistency: Thin; Fluid restriction: 1200 mL Fluid  Diet effective now                    Inpatient Medications  Scheduled Meds:  aspirin EC  81 mg Oral Daily   docusate sodium  200 mg Oral BID   enoxaparin (LOVENOX) injection  40 mg Subcutaneous Q24H   lactulose  30 g Oral TID   lidocaine  1 patch Transdermal Q24H   magnesium hydroxide  30 mL Oral BID   sodium chloride flush  3 mL Intravenous Q12H   tamsulosin  0.4 mg Oral QPC breakfast   Continuous Infusions:  sodium chloride     PRN Meds:.acetaminophen **OR** acetaminophen, methocarbamol, oxyCODONE, senna-docusate  Antibiotics  :    Anti-infectives (From admission, onward)    None         Objective:   Vitals:   06/25/22 0000 06/25/22 0200 06/25/22 0300 06/25/22 0400  BP: (!) 145/76   (!) 141/89  Pulse: 90 88  89  Resp: 20 16  20   Temp: 99.6 F (37.6 C)   97.8 F (36.6 C)  TempSrc: Oral   Oral  SpO2: 90%  92%  94%  Weight:   81.1 kg   Height:        Wt Readings from Last 3 Encounters:  06/25/22 81.1 kg  06/03/22 75.7 kg  05/13/22 75.3 kg      Intake/Output Summary (Last 24 hours) at 06/25/2022 1201 Last data filed at 06/25/2022 0400 Gross per 24 hour  Intake 1767.01 ml  Output 300 ml  Net 1467.01 ml     Physical Exam  Awake Alert, No new F.N deficits, Normal affect Zena.AT,PERRAL Supple Neck, No JVD,   Symmetrical Chest wall movement, Good air movement bilaterally, CTAB RRR,No Gallops,Rubs or new Murmurs,  +ve B.Sounds, Abd Soft, No tenderness,   No Cyanosis, Clubbing or edema        Data Review:    Recent Labs  Lab 06/23/22 2324 06/24/22 0625 06/25/22 0358 06/25/22 0548  WBC 10.1 9.4 10.2 10.3  HGB 11.4* 11.4* 10.3* 10.3*  HCT 32.8* 32.3* 28.5* 29.0*  PLT 167 171 173 183  MCV 92.7 90.7 89.1 89.0  MCH 32.2 32.0 32.2 31.6  MCHC 34.8 35.3 36.1* 35.5  RDW 11.8 11.8 11.7 11.9  LYMPHSABS 0.4*  --   --   --   MONOABS 0.9  --   --   --   EOSABS 0.0  --   --   --   BASOSABS 0.0  --   --   --     Recent Labs  Lab 06/23/22 2324 06/24/22 0625 06/24/22 1233 06/24/22 1607 06/24/22 2243 06/25/22 0358 06/25/22 0548  NA 117* 119* 121* 120* 120* 121*  --   K 4.0 3.9 3.9 4.4 4.5 4.4  --   CL 83* 87* 87* 87* 87* 89*  --   CO2 21* 22 23 21* 24 23  --   ANIONGAP 13 10 11 12 9 9   --   GLUCOSE 107* 116* 122* 147* 121* 111*  --   BUN 12 10 10 11 12 11   --   CREATININE 0.76 0.73 0.76 0.79 0.74 0.77  --   AST 19  --   --   --   --   --   --   ALT 21  --   --   --   --   --   --   ALKPHOS 50  --   --   --   --   --   --   BILITOT 1.3*  --   --   --   --   --   --   ALBUMIN 3.0*  --   --   --   --   --   --   BNP  --   --   --   --   --   --  878.6*  MG  --   --   --   --   --  1.9 1.9  CALCIUM 8.6* 8.4* 8.9 8.4* 8.2* 8.2*  --       Recent Labs  Lab 06/24/22 0625 06/24/22 1233 06/24/22 1607 06/24/22 2243 06/25/22 0358 06/25/22 0548  BNP  --   --   --   --   --  878.6*  MG  --   --   --   --  1.9 1.9  CALCIUM 8.4* 8.9 8.4* 8.2* 8.2*  --     Recent Labs  Lab 06/23/22 2324 06/24/22 0625  06/24/22 1233 06/24/22 1607 06/24/22 2243 06/25/22 0358 06/25/22 0548  WBC 10.1 9.4  --   --   --  10.2 10.3  PLT 167 171  --   --   --  173 183  CREATININE 0.76 0.73 0.76 0.79 0.74 0.77  --     ------------------------------------------------------------------------------------------------------------------ Lab Results  Component Value Date   CHOL 173 03/23/2022   HDL 73 03/23/2022   LDLCALC 86 03/23/2022   TRIG 62 03/23/2022   CHOLHDL 2.4 03/23/2022    Lab Results  Component Value Date   HGBA1C 5.7 (H) 07/20/2017    No results for input(s): "TSH", "T4TOTAL", "T3FREE", "THYROIDAB" in the last 72 hours.  Invalid input(s): "FREET3" ------------------------------------------------------------------------------------------------------------------ Cardiac Enzymes No results for input(s): "CKMB", "TROPONINI", "MYOGLOBIN" in the last 168 hours.  Invalid input(s): "CK"  Micro Results No results found for this or any previous visit (from the past 240 hour(s)).  Radiology Reports MR THORACIC SPINE WO CONTRAST  Result Date: 06/24/2022 CLINICAL DATA:  82 year old male with mid back pain. Evidence of mild lower thoracic spinal stenosis on lumbar MRI yesterday. EXAM: MRI THORACIC SPINE WITHOUT CONTRAST TECHNIQUE: Multiplanar, multisequence MR imaging of the thoracic spine was performed. No intravenous contrast was administered. COMPARISON:  CT Abdomen and Pelvis 0044 hours today. Lumbar MRI yesterday. FINDINGS: Limited cervical spine imaging: Lower cervical spine advanced disc and endplate degeneration with mild suspected spondylolisthesis (series 16, image 6). Evidence of C5-C6 spinal stenosis. Thoracic spine segmentation: Appears normal, and concordant with the lumbar spine numbering yesterday. Alignment: Mildly exaggerated upper thoracic kyphosis. Subtle anterolisthesis such as T2 on T3. Some straightening of the lower thoracic kyphosis. Mildly exaggerated kyphosis also at the  thoracolumbar junction. No significant scoliosis. Vertebrae: Normal background bone marrow signal. Scattered chronic thoracic endplate degeneration and/or small Schmorl's nodes. No marrow edema or evidence of acute osseous abnormality. Visible posterior ribs appear intact. Cord: No thoracic spinal cord signal abnormality despite some levels of degenerative spinal cord mass effect which are detailed below. Conus medullaris appears spared at T12-L1. Paraspinal and other soft tissues: Dependent left lung opacity and small layering bilateral pleural effusions. Superimposed moderate gastric hiatal hernia. See CT Abdomen and Pelvis from earlier today. Negative other visible chest and upper abdominal viscera. Thoracic paraspinal soft tissues remain within normal limits. Disc levels: T1-T2: Minor disc bulging. Mild to moderate facet hypertrophy. No spinal stenosis. Moderate right T1 neural foraminal stenosis. T2-T3: Mild anterolisthesis with disc bulging. Mild to moderate facet hypertrophy greater on the right. No spinal stenosis. Moderate right T2 neural foraminal stenosis. T3-T4: Possible right side facet ankylosis at this level. Moderate facet hypertrophy there and moderate to severe right T3 neural foraminal stenosis. But no disc degeneration or spinal stenosis. T4-T5: Mild facet and ligament flavum hypertrophy. No significant stenosis. T5-T6: Mostly anterior disc bulging and endplate spurring. No stenosis. T6-T7: Circumferential disc bulge with a broad-based posterior component (series 22, image 20) effacing the ventral CSF space. Borderline to mild spinal stenosis when combined with mild facet and ligament flavum hypertrophy. Mild if any spinal cord mass effect. No foraminal stenosis. T7-T8: More focal left paracentral disc or disc osteophyte complex on series 22, image 23. Mild posterior element hypertrophy. Borderline to mild spinal stenosis and mild left ventral cord mass effect. T8-T9: Small posterior disc bulge  or protrusion on series 22, image 27. Mild posterior element hypertrophy. No significant stenosis. T9-T10: Circumferential disc bulging. Mild posterior element hypertrophy. Borderline to mild spinal stenosis. T10-T11: Disc bulging and small right paracentral disc or disc osteophyte component on series 22, image 32. Up to moderate posterior element hypertrophy. Mild spinal stenosis. Mild if any spinal cord  mass effect. Up to mild left and mild to moderate right T10 neural foraminal stenosis. T11-T12: Circumferential disc bulge with larger right paracentral disc extrusion (series 19, image 10 and series 22, image 35) with mild posterior element hypertrophy. Mild spinal stenosis and ventral cord mass effect. No cord signal abnormality. No foraminal stenosis. T12-L1: Negative. IMPRESSION: 1. No acute osseous abnormality in the thoracic spine. 2. Widespread thoracic disc degeneration and posterior element hypertrophy. Small right paracentral disc extrusion at T11-T12, which perhaps is more acute than the remaining degenerative findings. Mild spinal stenosis and mild ventral spinal cord mass effect at that level. No cord signal abnormality. 3. Other mild multifactorial thoracic spinal stenosis T6-T7, T7-T8, T9-T10 and T10-T11. Mild additional cord mass effect but no thoracic cord signal abnormality. 4. Up to moderate occasional degenerative neural foraminal stenosis: Right T1, right T2, and right T10 nerve levels. 5. Partially visible advanced lower cervical spine degeneration with evidence of C5-C6 spinal stenosis. 6. Small layering pleural effusions and dependent left lung opacity. See CT Abdomen and Pelvis from earlier today. Electronically Signed   By: Odessa Fleming M.D.   On: 06/24/2022 04:50   CT ABDOMEN PELVIS W CONTRAST  Result Date: 06/24/2022 CLINICAL DATA:  Bowel obstruction suspected With a history of gout, hypertension, CAD, CHF, hepatitis who presents to the ED for evaluation of low back pain. States this  began abruptly 2 days ago. He was active, played a round of disc golf and went on 2 bike rides EXAM: CT ABDOMEN AND PELVIS WITH CONTRAST TECHNIQUE: Multidetector CT imaging of the abdomen and pelvis was performed using the standard protocol following bolus administration of intravenous contrast. RADIATION DOSE REDUCTION: This exam was performed according to the departmental dose-optimization program which includes automated exposure control, adjustment of the mA and/or kV according to patient size and/or use of iterative reconstruction technique. CONTRAST:  75mL OMNIPAQUE IOHEXOL 350 MG/ML SOLN COMPARISON:  None Available. FINDINGS: Lower chest: Moderate volume hiatal hernia. Coronary artery calcifications. Hepatobiliary: Indeterminate right posterior hepatic lobe 4.5 cm triangular hypodensity. Finding unchanged on delayed view. Hypodense lesion of the left hepatic lobe likely a simple hepatic cyst. Contracted gallbladder. No gallstones, gallbladder wall thickening, or pericholecystic fluid. No biliary dilatation. Pancreas: No focal lesion. Normal pancreatic contour. No surrounding inflammatory changes. No main pancreatic ductal dilatation. Spleen: Normal in size without focal abnormality. Adrenals/Urinary Tract: No adrenal nodule bilaterally. Nonspecific bilateral perinephric fat stranding. Bilateral kidneys enhance symmetrically. No hydronephrosis. No hydroureter. No nephroureterolithiasis. Fluid density lesion of the right kidney likely represents a simple renal cyst. Simple renal cysts, in the absence of clinically indicated signs/symptoms, require no independent follow-up. The urinary bladder is unremarkable. On delayed imaging, there is no urothelial wall thickening and there are no filling defects in the opacified portions of the bilateral collecting systems or ureters. Stomach/Bowel: Stomach is within normal limits. Increased stool burden within the ascending colon. Stool throughout the transverse colon.  Fecalized material within the distal small bowel lumen suggestive of an incompetent ileocecal valve versus slow transition. No evidence of bowel wall thickening or dilatation. Appendix appears normal. Vascular/Lymphatic: No abdominal aorta or iliac aneurysm. Severe atherosclerotic plaque of the aorta and its branches. No abdominal, pelvic, or inguinal lymphadenopathy. Reproductive: Prostate is unremarkable. Other: No intraperitoneal free fluid. No intraperitoneal free gas. No organized fluid collection. Musculoskeletal: CP. No suspicious lytic or blastic osseous lesions. No acute displaced fracture. Multilevel degenerative changes of the spine. IMPRESSION: 1. Moderate hiatal hernia. 2. Increased stool burden within the ascending colon  and transverse colon. Correlate for constipation. 3. Indeterminate right posterior hepatic lobe 4.5 cm triangular hypodensity. Correlate with point tenderness to palpation and recent trauma-could possibly represent a hepatic laceration with no active bleed. Comparison with prior cross-sectional imaging would be of value. Electronically Signed   By: Tish Frederickson M.D.   On: 06/24/2022 01:26   MR Lumbar Spine Wo Contrast  Result Date: 06/24/2022 CLINICAL DATA:  Initial evaluation for low back pain, cauda equina syndrome suspected. EXAM: MRI LUMBAR SPINE WITHOUT CONTRAST TECHNIQUE: Multiplanar, multisequence MR imaging of the lumbar spine was performed. No intravenous contrast was administered. COMPARISON:  Radiograph from 06/23/2022 and MRI from 12/27/2011. FINDINGS: Segmentation: Standard. Lowest well-formed disc space labeled the L5-S1 level. Alignment: Trace grade 1 degenerative stepwise retrolisthesis of L1 on L2 through L3 on L4. Additional trace 3 mm retrolisthesis of L5 on S1. Vertebrae: Vertebral body height maintained without acute or chronic fracture. Bone marrow signal intensity within normal limits. No worrisome osseous lesions. Scattered reactive endplate changes with  marrow edema present within the lumbar spine, most pronounced at L5-S1. Conus medullaris and cauda equina: Conus extends to the T12-L1 level. There is question of T2/STIR signal abnormality involving the distal cord at the level of T11-12 (series 6, image 9). This is not well seen on corresponding T2 weighted sequence, and could potentially be artifactual. Nerve roots of the cauda equina within normal limits. Paraspinal and other soft tissues: Paraspinous soft tissues within normal limits. 9 mm mildly complex cyst with intrinsic T1 hyperintensity partially visualize within the interpolar right kidney, indeterminate. Disc levels: T11-12: Seen only on sagittal projection. Disc desiccation with diffuse disc bulge. Small central disc protrusion with slight superior migration. Mild cord flattening without cord signal changes. No significant spinal stenosis. Foramina remain patent. T12-L1: Disc desiccation without significant disc bulge. No stenosis. L1-2: Degenerative intervertebral disc space narrowing with retrolisthesis. Diffuse disc bulge with reactive endplate spurring. Mild facet hypertrophy. Resultant mild bilateral subarticular stenosis. Central canal remains patent. Mild right L1 foraminal narrowing. Left neural foramen remains patent. L2-3: Degenerate intervertebral disc space narrowing with retrolisthesis. Diffuse disc bulge with reactive endplate spurring. Mild facet and ligament flavum hypertrophy. Resultant mild bilateral subarticular stenosis. Central canal remains patent. Mild to moderate bilateral L2 foraminal narrowing. L3-4: Degenerate intervertebral disc space narrowing with diffuse disc bulge and disc desiccation. Reactive endplate spurring. Superimposed left foraminal to extraforaminal disc protrusion contacts the exiting left L3 nerve root. Mild bilateral facet hypertrophy. Resultant mild bilateral subarticular stenosis. Central canal remains patent. Mild to moderate bilateral L3 foraminal  narrowing. L4-5: Degenerate intervertebral disc space narrowing with diffuse disc bulge. Reactive endplate spurring. Superimposed left foraminal disc protrusion contacts the exiting left L4 nerve root. Mild-to-moderate bilateral facet hypertrophy. Resultant mild-to-moderate canal with bilateral subarticular stenosis. Mild to moderate bilateral L4 foraminal narrowing. L5-S1: Degenerate intervertebral disc space narrowing with diffuse disc bulge and reactive endplate spurring. Moderate bilateral facet hypertrophy. Resultant moderate narrowing of the lateral recesses bilaterally. Central canal remains patent. Moderate bilateral L5 foraminal stenosis. IMPRESSION: 1. Question T2/STIR signal abnormality involving the distal cord at the level of T11-12. While this finding may be artifactual in nature, possible cord signal changes/edema is difficult to exclude. Further evaluation with dedicated MRI of the thoracic spine recommended for further evaluation. 2. Multilevel lumbar spondylosis with resultant mild to moderate diffuse lateral recess and spinal stenosis at L1-2 through L4-5 as above. No high-grade spinal stenosis within the lumbar spine itself. 3. Superimposed left foraminal to extraforaminal disc protrusions at L3-4  and L4-5, potentially affecting the exiting left L3 and L4 nerve roots respectively. 4. Mild to moderate bilateral L3 through L5 foraminal stenosis related to disc bulge, reactive endplate changes, and facet hypertrophy. 5. 9 mm mildly complex cyst with intrinsic T1 hyperintensity within the interpolar right kidney, indeterminate. Further evaluation with dedicated renal mass protocol CT and/or MRI recommended. This could be performed on a nonemergent outpatient basis. Electronically Signed   By: Rise Mu M.D.   On: 06/24/2022 01:24   DG Lumbar Spine Complete  Result Date: 06/23/2022 CLINICAL DATA:  Pain EXAM: LUMBAR SPINE - COMPLETE 4+ VIEW COMPARISON:  Lumbar spine x-ray 06/21/2022  FINDINGS: There is no evidence of lumbar spine fracture. Alignment is normal. There is moderate disc space narrowing at all levels of the lumbar spine with endplate osteophyte formation compatible with degenerative change. There is a very large amount of stool throughout the colon. There are atherosclerotic calcifications of the aorta. IMPRESSION: 1. No acute fracture or dislocation. 2. Moderate degenerative changes throughout the lumbar spine. 3. Very large amount of stool throughout the colon. Electronically Signed   By: Darliss Cheney M.D.   On: 06/23/2022 20:39   DG Lumbar Spine 2-3 Views  Result Date: 06/21/2022 CLINICAL DATA:  Low back pain EXAM: LUMBAR SPINE - 3 VIEW COMPARISON:  12/27/2011 MRI FINDINGS: Five lumbar type vertebral bodies are well visualized. Vertebral body height is well maintained. Multilevel osteophytic change and facet hypertrophic changes seen. Aortic calcifications noted. A single oblique film shows no pars defects. The opposite oblique was not able to be performed due to patient's inability to tolerate the exam. IMPRESSION: Somewhat limited lumbar spine as described. Multilevel degenerative changes are seen without acute abnormality. Electronically Signed   By: Alcide Clever M.D.   On: 06/21/2022 17:31      Signature  -   Susa Raring M.D on 06/25/2022 at 12:01 PM   -  To page go to [email or web address]

## 2022-06-25 NOTE — Evaluation (Signed)
Occupational Therapy Evaluation Patient Details Name: [name] MRN: [id number] DOB: 03/06/1940 Today's Date: 06/25/2022   History of Present Illness [name] is a 82 y.o. male who presents with low back pain and constipation. Labs are most notable for sodium 117. CT of the abdomen and pelvis demonstrates increased stool burden and right hepatic lobe hypodensity.  MRI lumbar spine is notable for artifact versus cord edema at T11-T12.  PMHx: CAD status post CABG in 2019, bioprosthetic aortic valve replacement, cardiomyopathy with recovered EF and gout.   Clinical Impression   [name] was evaluated s/p the above admission list. He is indep and very active at baseline. Upon evaluation he was significantly limited by sharp/shooting back back that occurred with movement. Overall he needed mod-max A for bed mobility and had fair sitting balance. Due to the deficits listed below he currently needs mod-max A for ADLs, however anticipate good progress towards indep baseline once back pain is managed. Pt will benefit from continued acute OT services to facilitate d/c home without follow up.        Recommendations for follow up therapy are one component of a multi-disciplinary discharge planning process, led by the attending physician.  Recommendations may be updated based on patient status, additional functional criteria and insurance authorization.   Assistance Recommended at Discharge Frequent or constant Supervision/Assistance  Patient can return home with the following A lot of help with walking and/or transfers;A lot of help with bathing/dressing/bathroom;Assistance with cooking/housework;Assistance with feeding;Direct supervision/assist for medications management;Direct supervision/assist for financial management;Assist for transportation;Help with stairs or ramp for entrance    Functional Status Assessment  Patient has had a recent decline in their functional status and demonstrates the  ability to make significant improvements in function in a reasonable and predictable amount of time.  Equipment Recommendations  Other (comment) (pending acute progression)       Precautions / Restrictions Precautions Precautions: Fall Restrictions Weight Bearing Restrictions: No      Mobility Bed Mobility Overal bed mobility: Needs Assistance Bed Mobility: Sidelying to Sit, Rolling Rolling: Mod assist Sidelying to sit: Max assist       General bed mobility comments: step by step cues    Transfers Overall transfer level: Needs assistance                 General transfer comment: unable to tolerate      Balance Overall balance assessment: Needs assistance Sitting-balance support: Feet supported, Single extremity supported Sitting balance-Leahy Scale: Fair Sitting balance - Comments: needs at least 1 UE supported to offload back             ADL either performed or assessed with clinical judgement   ADL Overall ADL's : Needs assistance/impaired Eating/Feeding: Independent   Grooming: Set up;Sitting   Upper Body Bathing: Moderate assistance;Sitting   Lower Body Bathing: Moderate assistance;Sitting/lateral leans   Upper Body Dressing : Moderate assistance;Sitting   Lower Body Dressing: Maximal assistance   Toilet Transfer: Maximal assistance;Stand-pivot;BSC/3in1   Toileting- Clothing Manipulation and Hygiene: Maximal assistance;Sit to/from stand       Functional mobility during ADLs: Maximal assistance General ADL Comments: assist due to significant pain     Vision Baseline Vision/History: 1 Wears glasses Vision Assessment?: No apparent visual deficits     Perception Perception Perception Tested?: No   Praxis Praxis Praxis tested?: Not tested    Pertinent Vitals/Pain Pain Assessment Pain Assessment: 0-10 Pain Score: 10-Worst pain ever Pain Location: back, 4 at rest, 10 with movement Pain Descriptors /  Indicators: Discomfort, Grimacing,  Guarding Pain Intervention(s): Limited activity within patient's tolerance, Monitored during session     Hand Dominance Right   Extremity/Trunk Assessment Upper Extremity Assessment Upper Extremity Assessment: Overall WFL for tasks assessed (limited overhead ROM due to exacerbation of back pain)   Lower Extremity Assessment Lower Extremity Assessment: Defer to PT evaluation   Cervical / Trunk Assessment Cervical / Trunk Assessment: Other exceptions Cervical / Trunk Exceptions: LBP   Communication Communication Communication: HOH   Cognition Arousal/Alertness: Awake/alert Behavior During Therapy: WFL for tasks assessed/performed Overall Cognitive Status: Within Functional Limits for tasks assessed                 General Comments: overall WFL. anxious in anticipation of pain     General Comments  VSS on RA, NT present to assist with bath            Home Living Family/patient expects to be discharged to:: Private residence Living Arrangements: Alone Available Help at Discharge: Family;Friend(s);Available PRN/intermittently Type of Home: Apartment Home Access: Stairs to enter Entrance Stairs-Number of Steps: 3 "small" steps Entrance Stairs-Rails: Right;Left (back) Home Layout: One level     Bathroom Shower/Tub: Chief Strategy Officer: Standard     Home Equipment: Rollator (4 wheels);BSC/3in1;Shower seat   Additional Comments: pt reports his wife passed 1 month ago      Prior Functioning/Environment Prior Level of Function : Independent/Modified Independent             Mobility Comments: no AD ADLs Comments: indep for all ADL/IADLs        OT Problem List: Decreased strength;Decreased range of motion;Decreased activity tolerance;Impaired balance (sitting and/or standing);Pain      OT Treatment/Interventions: Self-care/ADL training;Therapeutic exercise;DME and/or AE instruction;Therapeutic activities;Patient/family education;Balance  training    OT Goals(Current goals can be found in the care plan section) Acute Rehab OT Goals Patient Stated Goal: less pain OT Goal Formulation: With patient Time For Goal Achievement: 07/09/22 Potential to Achieve Goals: Good ADL Goals Pt Will Perform Grooming: with supervision;standing Pt Will Perform Upper Body Dressing: Independently Pt Will Perform Lower Body Dressing: with supervision;sit to/from stand Pt Will Transfer to Toilet: with supervision;ambulating;regular height toilet Additional ADL Goal #1: Pt will complete bed mobility independently as a precursor to ADLs  OT Frequency: Min 2X/week       AM-PAC OT "6 Clicks" Daily Activity     Outcome Measure Help from another person eating meals?: None Help from another person taking care of personal grooming?: A Little Help from another person toileting, which includes using toliet, bedpan, or urinal?: A Lot Help from another person bathing (including washing, rinsing, drying)?: A Lot Help from another person to put on and taking off regular upper body clothing?: A Lot Help from another person to put on and taking off regular lower body clothing?: A Lot 6 Click Score: 15   End of Session Nurse Communication: Mobility status  Activity Tolerance: Patient limited by pain Patient left: in bed;with call bell/phone within reach  OT Visit Diagnosis: Unsteadiness on feet (R26.81);Other abnormalities of gait and mobility (R26.89);Muscle weakness (generalized) (M62.81);Pain                Time: 1610-9604 OT Time Calculation (min): 13 min Charges:  OT General Charges $OT Visit: 1 Visit OT Evaluation $OT Eval Moderate Complexity: 1 Mod  Derenda Mis, OTR/L Acute Rehabilitation Services Office [phone] Secure Chat Communication Preferred  Donia Pounds 06/25/2022, 9:20 AM

## 2022-06-26 ENCOUNTER — Inpatient Hospital Stay (HOSPITAL_COMMUNITY): Payer: Medicare PPO

## 2022-06-26 DIAGNOSIS — E871 Hypo-osmolality and hyponatremia: Secondary | ICD-10-CM | POA: Diagnosis not present

## 2022-06-26 LAB — CBC WITH DIFFERENTIAL/PLATELET
Abs Immature Granulocytes: 0.06 10*3/uL (ref 0.00–0.07)
Basophils Absolute: 0 10*3/uL (ref 0.0–0.1)
Basophils Relative: 0 %
Eosinophils Absolute: 0 10*3/uL (ref 0.0–0.5)
Eosinophils Relative: 0 %
HCT: 30.3 % — ABNORMAL LOW (ref 39.0–52.0)
Hemoglobin: 10.6 g/dL — ABNORMAL LOW (ref 13.0–17.0)
Immature Granulocytes: 1 %
Lymphocytes Relative: 3 %
Lymphs Abs: 0.3 10*3/uL — ABNORMAL LOW (ref 0.7–4.0)
MCH: 32 pg (ref 26.0–34.0)
MCHC: 35 g/dL (ref 30.0–36.0)
MCV: 91.5 fL (ref 80.0–100.0)
Monocytes Absolute: 0.4 10*3/uL (ref 0.1–1.0)
Monocytes Relative: 5 %
Neutro Abs: 8.2 10*3/uL — ABNORMAL HIGH (ref 1.7–7.7)
Neutrophils Relative %: 91 %
Platelets: 199 10*3/uL (ref 150–400)
RBC: 3.31 MIL/uL — ABNORMAL LOW (ref 4.22–5.81)
RDW: 11.8 % (ref 11.5–15.5)
WBC: 8.9 10*3/uL (ref 4.0–10.5)
nRBC: 0 % (ref 0.0–0.2)

## 2022-06-26 LAB — BASIC METABOLIC PANEL
Anion gap: 10 (ref 5–15)
BUN: 12 mg/dL (ref 8–23)
CO2: 22 mmol/L (ref 22–32)
Calcium: 8.3 mg/dL — ABNORMAL LOW (ref 8.9–10.3)
Chloride: 91 mmol/L — ABNORMAL LOW (ref 98–111)
Creatinine, Ser: 0.7 mg/dL (ref 0.61–1.24)
GFR, Estimated: >60 mL/min (ref >60)
Glucose, Bld: 124 mg/dL — ABNORMAL HIGH (ref 70–99)
Potassium: 4.1 mmol/L (ref 3.5–5.1)
Sodium: 123 mmol/L — ABNORMAL LOW (ref 135–145)

## 2022-06-26 LAB — UREA NITROGEN, URINE: Urea Nitrogen, Ur: 791 mg/dL

## 2022-06-26 LAB — MAGNESIUM: Magnesium: 2 mg/dL (ref 1.7–2.4)

## 2022-06-26 LAB — BRAIN NATRIURETIC PEPTIDE: B Natriuretic Peptide: 777.5 pg/mL — ABNORMAL HIGH (ref 0.0–100.0)

## 2022-06-26 MED ORDER — SODIUM CHLORIDE 1 G PO TABS
1.0000 g | ORAL_TABLET | Freq: Three times a day (TID) | ORAL | Status: AC
  Administered 2022-06-26 – 2022-06-27 (×3): 1 g via ORAL
  Filled 2022-06-26 (×3): qty 1

## 2022-06-26 MED ORDER — DEXAMETHASONE SODIUM PHOSPHATE 4 MG/ML IJ SOLN
4.0000 mg | INTRAMUSCULAR | Status: DC
  Administered 2022-06-26 – 2022-06-28 (×3): 4 mg via INTRAVENOUS
  Filled 2022-06-26 (×3): qty 1

## 2022-06-26 MED ORDER — SODIUM CHLORIDE 0.9 % IV SOLN: INTRAVENOUS | Status: DC

## 2022-06-26 NOTE — Progress Notes (Signed)
PROGRESS NOTE                                                                                                                                                                                                             Patient Demographics:    [name], is a 82 y.o. male, DOB - Sep 10, 1940, ZOX:[id number]  Outpatient Primary MD for the patient is Ngetich, Dinah C, NP    LOS - 2  Admit date - 06/23/2022    Chief Complaint  Patient presents with   Back Pain   Constipation       Brief Narrative (HPI from H&P)   82 y.o. male with medical history significant for CAD status post CABG in 2019, bioprosthetic aortic valve replacement, cardiomyopathy with recovered EF, and gout who presents to the emergency department for evaluation of low back pain and constipation.    Subjective:   Patient in bed, appears comfortable, denies any headache, no fever, no chest pain or pressure, no shortness of breath , no abdominal pain, lower back pain much improved, constipation much improved. No new focal weakness.   Assessment  & Plan :    Severe hyponatremia.  Appears to be clinically dehydrated with excessive free water intake on top of intravascular depletion.  Improving with IV fluids continue, restrict free water intake.  Monitor.    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  He has no focal neurological deficits, no signs suggestive of cauda equina, improved after low-dose IV steroids started on 06/25/2022, reviewed images with neurosurgery Dr. Clydell Hakim, supportive care, PT-OT and monitor.   Incidental finding of 4.5 cm liver hypodensity.  No tenderness on palpation.  Outpatient follow-up with PCP and gastroenterology.  Incidental renal cyst noted on MRI.  Outpatient dedicated CT renal protocol per PCP.  Constipation.   Resolved after bowel regimen, continue to monitor.       Condition -   Guarded  Family Communication  :  None present  Code Status :  Full  Consults  : Reviewed T and L-spine images with neurosurgery on-call, Clydell Hakim.  Outpatient follow-up.  Agrees with Decadron.  PUD Prophylaxis :    Procedures  :     CT - 1. Moderate hiatal  hernia. 2. Increased stool burden within the ascending colon and transverse colon. Correlate for constipation. 3. Indeterminate right posterior hepatic lobe 4.5 cm triangular hypodensity. Correlate with point tenderness to palpation and recent trauma-could possibly represent a hepatic laceration with no active bleed. Comparison with prior cross-sectional imaging would be of value.  MRI L Spine - 1. Question T2/STIR signal abnormality involving the distal cord at the level of T11-12. While this finding may be artifactual in nature, possible cord signal changes/edema is difficult to exclude. Further evaluation with dedicated MRI of the thoracic spine recommended for further evaluation. 2. Multilevel lumbar spondylosis with resultant mild to moderate diffuse lateral recess and spinal stenosis at L1-2 through L4-5 as above. No high-grade spinal stenosis within the lumbar spine itself. 3. Superimposed left foraminal to extraforaminal disc protrusions at L3-4 and L4-5, potentially affecting the exiting left L3 and L4 nerve roots respectively. 4. Mild to moderate bilateral L3 through L5 foraminal stenosis related to disc bulge, reactive endplate changes, and facet hypertrophy. 5. 9 mm mildly complex cyst with intrinsic T1 hyperintensity within the interpolar right kidney, indeterminate. Further evaluation with dedicated renal mass protocol CT and/or MRI recommended. This could be performed on a nonemergent outpatient basis.   MRI T Spine - 1. No acute osseous abnormality in the thoracic spine. 2. Widespread thoracic disc degeneration and posterior element hypertrophy.  Small right paracentral disc extrusion at T11-T12, which perhaps is more acute than the remaining degenerative findings. Mild spinal stenosis and mild ventral spinal cord mass effect at that level. No cord signal abnormality. 3. Other mild multifactorial thoracic spinal stenosis T6-T7, T7-T8, T9-T10 and T10-T11. Mild additional cord mass effect but no thoracic cord signal abnormality. 4. Up to moderate occasional degenerative neural foraminal stenosis: Right T1, right T2, and right T10 nerve levels. 5. Partially visible advanced lower cervical spine degeneration with evidence of C5-C6 spinal stenosis. 6. Small layering pleural effusions and dependent left lung opacity. See CT Abdomen and Pelvis from earlier today.       Disposition Plan  :    Status is: Inpatient   DVT Prophylaxis  :    enoxaparin (LOVENOX) injection 40 mg Start: 06/24/22 1000    Lab Results  Component Value Date   PLT 199 06/26/2022    Diet :  Diet Order             Diet regular Room service appropriate? Yes; Fluid consistency: Thin; Fluid restriction: 1200 mL Fluid  Diet effective now                    Inpatient Medications  Scheduled Meds:  aspirin EC  81 mg Oral Daily   dexamethasone (DECADRON) injection  4 mg Intravenous Q24H   docusate sodium  200 mg Oral BID   enoxaparin (LOVENOX) injection  40 mg Subcutaneous Q24H   lidocaine  1 patch Transdermal Q24H   sodium chloride flush  3 mL Intravenous Q12H   sodium chloride  1 g Oral TID WC   tamsulosin  0.4 mg Oral QPC breakfast   Continuous Infusions:  sodium chloride     PRN Meds:.acetaminophen **OR** acetaminophen, methocarbamol, oxyCODONE, senna-docusate  Antibiotics  :    Anti-infectives (From admission, onward)    None         Objective:   Vitals:   06/25/22 2200 06/26/22 0000 06/26/22 0200 06/26/22 0300  BP:  (!) 150/70    Pulse: 68 73 76   Resp: 16 10 13    Temp:  TempSrc:      SpO2: 94% 93% 96%   Weight:    80 kg   Height:        Wt Readings from Last 3 Encounters:  06/26/22 80 kg  06/03/22 75.7 kg  05/13/22 75.3 kg     Intake/Output Summary (Last 24 hours) at 06/26/2022 0925 Last data filed at 06/25/2022 2300 Gross per 24 hour  Intake 313.69 ml  Output 900 ml  Net -586.31 ml     Physical Exam  Awake Alert, No new F.N deficits, Normal affect Graham.AT,PERRAL Supple Neck, No JVD,   Symmetrical Chest wall movement, Good air movement bilaterally, CTAB RRR,No Gallops,Rubs or new Murmurs,  +ve B.Sounds, Abd Soft, No tenderness,   No Cyanosis, Clubbing or edema        Data Review:    Recent Labs  Lab 06/23/22 2324 06/24/22 0625 06/25/22 0358 06/25/22 0548 06/26/22 0328  WBC 10.1 9.4 10.2 10.3 8.9  HGB 11.4* 11.4* 10.3* 10.3* 10.6*  HCT 32.8* 32.3* 28.5* 29.0* 30.3*  PLT 167 171 173 183 199  MCV 92.7 90.7 89.1 89.0 91.5  MCH 32.2 32.0 32.2 31.6 32.0  MCHC 34.8 35.3 36.1* 35.5 35.0  RDW 11.8 11.8 11.7 11.9 11.8  LYMPHSABS 0.4*  --   --   --  0.3*  MONOABS 0.9  --   --   --  0.4  EOSABS 0.0  --   --   --  0.0  BASOSABS 0.0  --   --   --  0.0    Recent Labs  Lab 06/23/22 2324 06/24/22 0625 06/24/22 1233 06/24/22 1607 06/24/22 2243 06/25/22 0358 06/25/22 0548 06/26/22 0328  NA 117*   < > 121* 120* 120* 121*  --  123*  K 4.0   < > 3.9 4.4 4.5 4.4  --  4.1  CL 83*   < > 87* 87* 87* 89*  --  91*  CO2 21*   < > 23 21* 24 23  --  22  ANIONGAP 13   < > 11 12 9 9   --  10  GLUCOSE 107*   < > 122* 147* 121* 111*  --  124*  BUN 12   < > 10 11 12 11   --  12  CREATININE 0.76   < > 0.76 0.79 0.74 0.77  --  0.70  AST 19  --   --   --   --   --   --   --   ALT 21  --   --   --   --   --   --   --   ALKPHOS 50  --   --   --   --   --   --   --   BILITOT 1.3*  --   --   --   --   --   --   --   ALBUMIN 3.0*  --   --   --   --   --   --   --   BNP  --   --   --   --   --   --  878.6* 777.5*  MG  --   --   --   --   --  1.9 1.9 2.0  CALCIUM 8.6*   < > 8.9 8.4* 8.2* 8.2*  --  8.3*    < > = values in this interval not displayed.  Recent Labs  Lab 06/24/22 1233 06/24/22 1607 06/24/22 2243 06/25/22 0358 06/25/22 0548 06/26/22 0328  BNP  --   --   --   --  878.6* 777.5*  MG  --   --   --  1.9 1.9 2.0  CALCIUM 8.9 8.4* 8.2* 8.2*  --  8.3*    Recent Labs  Lab 06/23/22 2324 06/24/22 0625 06/24/22 1233 06/24/22 1607 06/24/22 2243 06/25/22 0358 06/25/22 0548 06/26/22 0328  WBC 10.1 9.4  --   --   --  10.2 10.3 8.9  PLT 167 171  --   --   --  173 183 199  CREATININE 0.76 0.73 0.76 0.79 0.74 0.77  --  0.70    ------------------------------------------------------------------------------------------------------------------ Lab Results  Component Value Date   CHOL 173 03/23/2022   HDL 73 03/23/2022   LDLCALC 86 03/23/2022   TRIG 62 03/23/2022   CHOLHDL 2.4 03/23/2022    Lab Results  Component Value Date   HGBA1C 5.7 (H) 07/20/2017    No results for input(s): "TSH", "T4TOTAL", "T3FREE", "THYROIDAB" in the last 72 hours.  Invalid input(s): "FREET3" ------------------------------------------------------------------------------------------------------------------ Cardiac Enzymes No results for input(s): "CKMB", "TROPONINI", "MYOGLOBIN" in the last 168 hours.  Invalid input(s): "CK"  Micro Results No results found for this or any previous visit (from the past 240 hour(s)).  Radiology Reports DG Chest Port 1 View  Result Date: 06/26/2022 CLINICAL DATA:  Shortness of breath EXAM: PORTABLE CHEST 1 VIEW COMPARISON:  08/28/2017 FINDINGS: Prior median sternotomy and valve replacement. Heart is borderline in size. Mediastinal contours within normal limits. Aortic atherosclerosis. No confluent airspace opacities, effusions or edema. No acute bony abnormality. IMPRESSION: No active disease. Electronically Signed   By: Charlett Nose M.D.   On: 06/26/2022 08:25   MR THORACIC SPINE WO CONTRAST  Result Date: 06/24/2022 CLINICAL DATA:  82 year old male with  mid back pain. Evidence of mild lower thoracic spinal stenosis on lumbar MRI yesterday. EXAM: MRI THORACIC SPINE WITHOUT CONTRAST TECHNIQUE: Multiplanar, multisequence MR imaging of the thoracic spine was performed. No intravenous contrast was administered. COMPARISON:  CT Abdomen and Pelvis 0044 hours today. Lumbar MRI yesterday. FINDINGS: Limited cervical spine imaging: Lower cervical spine advanced disc and endplate degeneration with mild suspected spondylolisthesis (series 16, image 6). Evidence of C5-C6 spinal stenosis. Thoracic spine segmentation: Appears normal, and concordant with the lumbar spine numbering yesterday. Alignment: Mildly exaggerated upper thoracic kyphosis. Subtle anterolisthesis such as T2 on T3. Some straightening of the lower thoracic kyphosis. Mildly exaggerated kyphosis also at the thoracolumbar junction. No significant scoliosis. Vertebrae: Normal background bone marrow signal. Scattered chronic thoracic endplate degeneration and/or small Schmorl's nodes. No marrow edema or evidence of acute osseous abnormality. Visible posterior ribs appear intact. Cord: No thoracic spinal cord signal abnormality despite some levels of degenerative spinal cord mass effect which are detailed below. Conus medullaris appears spared at T12-L1. Paraspinal and other soft tissues: Dependent left lung opacity and small layering bilateral pleural effusions. Superimposed moderate gastric hiatal hernia. See CT Abdomen and Pelvis from earlier today. Negative other visible chest and upper abdominal viscera. Thoracic paraspinal soft tissues remain within normal limits. Disc levels: T1-T2: Minor disc bulging. Mild to moderate facet hypertrophy. No spinal stenosis. Moderate right T1 neural foraminal stenosis. T2-T3: Mild anterolisthesis with disc bulging. Mild to moderate facet hypertrophy greater on the right. No spinal stenosis. Moderate right T2 neural foraminal stenosis. T3-T4: Possible right side facet ankylosis  at this level. Moderate facet hypertrophy there and moderate to severe  right T3 neural foraminal stenosis. But no disc degeneration or spinal stenosis. T4-T5: Mild facet and ligament flavum hypertrophy. No significant stenosis. T5-T6: Mostly anterior disc bulging and endplate spurring. No stenosis. T6-T7: Circumferential disc bulge with a broad-based posterior component (series 22, image 20) effacing the ventral CSF space. Borderline to mild spinal stenosis when combined with mild facet and ligament flavum hypertrophy. Mild if any spinal cord mass effect. No foraminal stenosis. T7-T8: More focal left paracentral disc or disc osteophyte complex on series 22, image 23. Mild posterior element hypertrophy. Borderline to mild spinal stenosis and mild left ventral cord mass effect. T8-T9: Small posterior disc bulge or protrusion on series 22, image 27. Mild posterior element hypertrophy. No significant stenosis. T9-T10: Circumferential disc bulging. Mild posterior element hypertrophy. Borderline to mild spinal stenosis. T10-T11: Disc bulging and small right paracentral disc or disc osteophyte component on series 22, image 32. Up to moderate posterior element hypertrophy. Mild spinal stenosis. Mild if any spinal cord mass effect. Up to mild left and mild to moderate right T10 neural foraminal stenosis. T11-T12: Circumferential disc bulge with larger right paracentral disc extrusion (series 19, image 10 and series 22, image 35) with mild posterior element hypertrophy. Mild spinal stenosis and ventral cord mass effect. No cord signal abnormality. No foraminal stenosis. T12-L1: Negative. IMPRESSION: 1. No acute osseous abnormality in the thoracic spine. 2. Widespread thoracic disc degeneration and posterior element hypertrophy. Small right paracentral disc extrusion at T11-T12, which perhaps is more acute than the remaining degenerative findings. Mild spinal stenosis and mild ventral spinal cord mass effect at that level. No  cord signal abnormality. 3. Other mild multifactorial thoracic spinal stenosis T6-T7, T7-T8, T9-T10 and T10-T11. Mild additional cord mass effect but no thoracic cord signal abnormality. 4. Up to moderate occasional degenerative neural foraminal stenosis: Right T1, right T2, and right T10 nerve levels. 5. Partially visible advanced lower cervical spine degeneration with evidence of C5-C6 spinal stenosis. 6. Small layering pleural effusions and dependent left lung opacity. See CT Abdomen and Pelvis from earlier today. Electronically Signed   By: Odessa Fleming M.D.   On: 06/24/2022 04:50   CT ABDOMEN PELVIS W CONTRAST  Result Date: 06/24/2022 CLINICAL DATA:  Bowel obstruction suspected With a history of gout, hypertension, CAD, CHF, hepatitis who presents to the ED for evaluation of low back pain. States this began abruptly 2 days ago. He was active, played a round of disc golf and went on 2 bike rides EXAM: CT ABDOMEN AND PELVIS WITH CONTRAST TECHNIQUE: Multidetector CT imaging of the abdomen and pelvis was performed using the standard protocol following bolus administration of intravenous contrast. RADIATION DOSE REDUCTION: This exam was performed according to the departmental dose-optimization program which includes automated exposure control, adjustment of the mA and/or kV according to patient size and/or use of iterative reconstruction technique. CONTRAST:  75mL OMNIPAQUE IOHEXOL 350 MG/ML SOLN COMPARISON:  None Available. FINDINGS: Lower chest: Moderate volume hiatal hernia. Coronary artery calcifications. Hepatobiliary: Indeterminate right posterior hepatic lobe 4.5 cm triangular hypodensity. Finding unchanged on delayed view. Hypodense lesion of the left hepatic lobe likely a simple hepatic cyst. Contracted gallbladder. No gallstones, gallbladder wall thickening, or pericholecystic fluid. No biliary dilatation. Pancreas: No focal lesion. Normal pancreatic contour. No surrounding inflammatory changes. No main  pancreatic ductal dilatation. Spleen: Normal in size without focal abnormality. Adrenals/Urinary Tract: No adrenal nodule bilaterally. Nonspecific bilateral perinephric fat stranding. Bilateral kidneys enhance symmetrically. No hydronephrosis. No hydroureter. No nephroureterolithiasis. Fluid density lesion of the right kidney  likely represents a simple renal cyst. Simple renal cysts, in the absence of clinically indicated signs/symptoms, require no independent follow-up. The urinary bladder is unremarkable. On delayed imaging, there is no urothelial wall thickening and there are no filling defects in the opacified portions of the bilateral collecting systems or ureters. Stomach/Bowel: Stomach is within normal limits. Increased stool burden within the ascending colon. Stool throughout the transverse colon. Fecalized material within the distal small bowel lumen suggestive of an incompetent ileocecal valve versus slow transition. No evidence of bowel wall thickening or dilatation. Appendix appears normal. Vascular/Lymphatic: No abdominal aorta or iliac aneurysm. Severe atherosclerotic plaque of the aorta and its branches. No abdominal, pelvic, or inguinal lymphadenopathy. Reproductive: Prostate is unremarkable. Other: No intraperitoneal free fluid. No intraperitoneal free gas. No organized fluid collection. Musculoskeletal: CP. No suspicious lytic or blastic osseous lesions. No acute displaced fracture. Multilevel degenerative changes of the spine. IMPRESSION: 1. Moderate hiatal hernia. 2. Increased stool burden within the ascending colon and transverse colon. Correlate for constipation. 3. Indeterminate right posterior hepatic lobe 4.5 cm triangular hypodensity. Correlate with point tenderness to palpation and recent trauma-could possibly represent a hepatic laceration with no active bleed. Comparison with prior cross-sectional imaging would be of value. Electronically Signed   By: Tish Frederickson M.D.   On:  06/24/2022 01:26   MR Lumbar Spine Wo Contrast  Result Date: 06/24/2022 CLINICAL DATA:  Initial evaluation for low back pain, cauda equina syndrome suspected. EXAM: MRI LUMBAR SPINE WITHOUT CONTRAST TECHNIQUE: Multiplanar, multisequence MR imaging of the lumbar spine was performed. No intravenous contrast was administered. COMPARISON:  Radiograph from 06/23/2022 and MRI from 12/27/2011. FINDINGS: Segmentation: Standard. Lowest well-formed disc space labeled the L5-S1 level. Alignment: Trace grade 1 degenerative stepwise retrolisthesis of L1 on L2 through L3 on L4. Additional trace 3 mm retrolisthesis of L5 on S1. Vertebrae: Vertebral body height maintained without acute or chronic fracture. Bone marrow signal intensity within normal limits. No worrisome osseous lesions. Scattered reactive endplate changes with marrow edema present within the lumbar spine, most pronounced at L5-S1. Conus medullaris and cauda equina: Conus extends to the T12-L1 level. There is question of T2/STIR signal abnormality involving the distal cord at the level of T11-12 (series 6, image 9). This is not well seen on corresponding T2 weighted sequence, and could potentially be artifactual. Nerve roots of the cauda equina within normal limits. Paraspinal and other soft tissues: Paraspinous soft tissues within normal limits. 9 mm mildly complex cyst with intrinsic T1 hyperintensity partially visualize within the interpolar right kidney, indeterminate. Disc levels: T11-12: Seen only on sagittal projection. Disc desiccation with diffuse disc bulge. Small central disc protrusion with slight superior migration. Mild cord flattening without cord signal changes. No significant spinal stenosis. Foramina remain patent. T12-L1: Disc desiccation without significant disc bulge. No stenosis. L1-2: Degenerative intervertebral disc space narrowing with retrolisthesis. Diffuse disc bulge with reactive endplate spurring. Mild facet hypertrophy. Resultant  mild bilateral subarticular stenosis. Central canal remains patent. Mild right L1 foraminal narrowing. Left neural foramen remains patent. L2-3: Degenerate intervertebral disc space narrowing with retrolisthesis. Diffuse disc bulge with reactive endplate spurring. Mild facet and ligament flavum hypertrophy. Resultant mild bilateral subarticular stenosis. Central canal remains patent. Mild to moderate bilateral L2 foraminal narrowing. L3-4: Degenerate intervertebral disc space narrowing with diffuse disc bulge and disc desiccation. Reactive endplate spurring. Superimposed left foraminal to extraforaminal disc protrusion contacts the exiting left L3 nerve root. Mild bilateral facet hypertrophy. Resultant mild bilateral subarticular stenosis. Central canal remains patent. Mild  to moderate bilateral L3 foraminal narrowing. L4-5: Degenerate intervertebral disc space narrowing with diffuse disc bulge. Reactive endplate spurring. Superimposed left foraminal disc protrusion contacts the exiting left L4 nerve root. Mild-to-moderate bilateral facet hypertrophy. Resultant mild-to-moderate canal with bilateral subarticular stenosis. Mild to moderate bilateral L4 foraminal narrowing. L5-S1: Degenerate intervertebral disc space narrowing with diffuse disc bulge and reactive endplate spurring. Moderate bilateral facet hypertrophy. Resultant moderate narrowing of the lateral recesses bilaterally. Central canal remains patent. Moderate bilateral L5 foraminal stenosis. IMPRESSION: 1. Question T2/STIR signal abnormality involving the distal cord at the level of T11-12. While this finding may be artifactual in nature, possible cord signal changes/edema is difficult to exclude. Further evaluation with dedicated MRI of the thoracic spine recommended for further evaluation. 2. Multilevel lumbar spondylosis with resultant mild to moderate diffuse lateral recess and spinal stenosis at L1-2 through L4-5 as above. No high-grade spinal  stenosis within the lumbar spine itself. 3. Superimposed left foraminal to extraforaminal disc protrusions at L3-4 and L4-5, potentially affecting the exiting left L3 and L4 nerve roots respectively. 4. Mild to moderate bilateral L3 through L5 foraminal stenosis related to disc bulge, reactive endplate changes, and facet hypertrophy. 5. 9 mm mildly complex cyst with intrinsic T1 hyperintensity within the interpolar right kidney, indeterminate. Further evaluation with dedicated renal mass protocol CT and/or MRI recommended. This could be performed on a nonemergent outpatient basis. Electronically Signed   By: Rise Mu M.D.   On: 06/24/2022 01:24   DG Lumbar Spine Complete  Result Date: 06/23/2022 CLINICAL DATA:  Pain EXAM: LUMBAR SPINE - COMPLETE 4+ VIEW COMPARISON:  Lumbar spine x-ray 06/21/2022 FINDINGS: There is no evidence of lumbar spine fracture. Alignment is normal. There is moderate disc space narrowing at all levels of the lumbar spine with endplate osteophyte formation compatible with degenerative change. There is a very large amount of stool throughout the colon. There are atherosclerotic calcifications of the aorta. IMPRESSION: 1. No acute fracture or dislocation. 2. Moderate degenerative changes throughout the lumbar spine. 3. Very large amount of stool throughout the colon. Electronically Signed   By: Darliss Cheney M.D.   On: 06/23/2022 20:39      Signature  -   Susa Raring M.D on 06/26/2022 at 9:25 AM   -  To page go to [email or web address]

## 2022-06-27 DIAGNOSIS — E871 Hypo-osmolality and hyponatremia: Secondary | ICD-10-CM | POA: Diagnosis not present

## 2022-06-27 LAB — BASIC METABOLIC PANEL
Anion gap: 7 (ref 5–15)
Anion gap: 11 (ref 5–15)
BUN: 15 mg/dL (ref 8–23)
BUN: 18 mg/dL (ref 8–23)
CO2: 25 mmol/L (ref 22–32)
CO2: 22 mmol/L (ref 22–32)
Calcium: 8.3 mg/dL — ABNORMAL LOW (ref 8.9–10.3)
Calcium: 8.8 mg/dL — ABNORMAL LOW (ref 8.9–10.3)
Chloride: 90 mmol/L — ABNORMAL LOW (ref 98–111)
Chloride: 94 mmol/L — ABNORMAL LOW (ref 98–111)
Creatinine, Ser: 0.73 mg/dL (ref 0.61–1.24)
Creatinine, Ser: 0.89 mg/dL (ref 0.61–1.24)
GFR, Estimated: >60 mL/min (ref >60)
GFR, Estimated: >60 mL/min (ref >60)
Glucose, Bld: 120 mg/dL — ABNORMAL HIGH (ref 70–99)
Glucose, Bld: 131 mg/dL — ABNORMAL HIGH (ref 70–99)
Potassium: 4.5 mmol/L (ref 3.5–5.1)
Potassium: 4.5 mmol/L (ref 3.5–5.1)
Sodium: 122 mmol/L — ABNORMAL LOW (ref 135–145)
Sodium: 127 mmol/L — ABNORMAL LOW (ref 135–145)

## 2022-06-27 LAB — CREATININE, URINE, RANDOM: Creatinine, Urine: 50 mg/dL

## 2022-06-27 LAB — CBC WITH DIFFERENTIAL/PLATELET
Abs Immature Granulocytes: 0.05 10*3/uL (ref 0.00–0.07)
Basophils Absolute: 0 10*3/uL (ref 0.0–0.1)
Basophils Relative: 0 %
Eosinophils Absolute: 0 10*3/uL (ref 0.0–0.5)
Eosinophils Relative: 0 %
HCT: 30.8 % — ABNORMAL LOW (ref 39.0–52.0)
Hemoglobin: 10.7 g/dL — ABNORMAL LOW (ref 13.0–17.0)
Immature Granulocytes: 1 %
Lymphocytes Relative: 3 %
Lymphs Abs: 0.3 10*3/uL — ABNORMAL LOW (ref 0.7–4.0)
MCH: 31.8 pg (ref 26.0–34.0)
MCHC: 34.7 g/dL (ref 30.0–36.0)
MCV: 91.4 fL (ref 80.0–100.0)
Monocytes Absolute: 0.6 10*3/uL (ref 0.1–1.0)
Monocytes Relative: 6 %
Neutro Abs: 9 10*3/uL — ABNORMAL HIGH (ref 1.7–7.7)
Neutrophils Relative %: 90 %
Platelets: 223 10*3/uL (ref 150–400)
RBC: 3.37 MIL/uL — ABNORMAL LOW (ref 4.22–5.81)
RDW: 11.9 % (ref 11.5–15.5)
WBC: 9.9 10*3/uL (ref 4.0–10.5)
nRBC: 0 % (ref 0.0–0.2)

## 2022-06-27 LAB — OSMOLALITY, URINE: Osmolality, Ur: 425 mOsm/kg (ref 300–900)

## 2022-06-27 LAB — CORTISOL: Cortisol, Plasma: 13.2 ug/dL

## 2022-06-27 LAB — OSMOLALITY: Osmolality: 270 mOsm/kg — ABNORMAL LOW (ref 275–295)

## 2022-06-27 LAB — MAGNESIUM: Magnesium: 1.9 mg/dL (ref 1.7–2.4)

## 2022-06-27 LAB — TSH: TSH: 2.416 u[IU]/mL (ref 0.350–4.500)

## 2022-06-27 LAB — URIC ACID: Uric Acid, Serum: 4.4 mg/dL (ref 3.7–8.6)

## 2022-06-27 LAB — SODIUM, URINE, RANDOM: Sodium, Ur: 96 mmol/L

## 2022-06-27 LAB — BRAIN NATRIURETIC PEPTIDE: B Natriuretic Peptide: 514.4 pg/mL — ABNORMAL HIGH (ref 0.0–100.0)

## 2022-06-27 MED ORDER — DICLOFENAC SODIUM 1 % EX GEL
2.0000 g | Freq: Four times a day (QID) | CUTANEOUS | Status: DC
  Administered 2022-06-27 (×4): 2 g via TOPICAL
  Filled 2022-06-27: qty 100

## 2022-06-27 MED ORDER — NON FORMULARY: 8.0000 mg | Freq: Every day | Status: DC

## 2022-06-27 MED ORDER — SILODOSIN 8 MG PO CAPS
8.0000 mg | ORAL_CAPSULE | Freq: Every day | ORAL | Status: DC
  Administered 2022-06-28: 8 mg via ORAL
  Filled 2022-06-27: qty 1

## 2022-06-27 MED ORDER — TOLVAPTAN 15 MG PO TABS
15.0000 mg | ORAL_TABLET | Freq: Once | ORAL | Status: AC
  Administered 2022-06-27: 15 mg via ORAL
  Filled 2022-06-27: qty 1

## 2022-06-27 MED ORDER — TAMSULOSIN HCL 0.4 MG PO CAPS
0.4000 mg | ORAL_CAPSULE | Freq: Once | ORAL | Status: AC
  Administered 2022-06-27: 0.4 mg via ORAL
  Filled 2022-06-27: qty 1

## 2022-06-27 NOTE — Consult Note (Addendum)
[facility] Renal Consultation Note  Requesting MD: Dr. Thedore Mins Indication for Consultation: Hyponatremia  HPI:  [name] is a 82 y.o. male with coronary artery disease s/p CABG, ischemic cardiomyopathy with recovered ejection fraction, HTN, BPH admitted 5/17 with severe asymptomatic hyponatremia and acute low back pain. Patient reports moving to his new house two weeks ago and has been doing his normal level of activity (walking, biking, frequent exercising). States he has not had any excessive fluid intake and has been at his baseline functional status until 5/15 when he started having intense lower back pain. Denies any known injuries. Since that time he has largely been bedbound, but does report any other new symptoms. Urinating normally without difficulty, no change in color or frequency. He does take medication for BPH, which we do not have on formulary here.  Since admission he has been limiting his fluid intake to roughly 1L daily. He does believe he takes hydrochlorothiazide at home, denies taking any SSRI. He is concerned today regarding post-discharge planning, reporting he does not feel as though he is ready to go back home alone.   PMHx:   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    HFrEF (heart failure with reduced ejection fraction) (HCC)    Hypertension    Moderate mitral regurgitation    Severe aortic stenosis     Past Surgical History:  Procedure Laterality Date   AORTIC VALVE REPLACEMENT N/A 07/24/2017   Procedure: AORTIC VALVE REPLACEMENT (AVR) using 23mm Inspiris Aortic Valve;  Surgeon: Alleen Borne, MD;  Location: [facility] OR;  Service: Open Heart Surgery;  Laterality: N/A;   CLIPPING OF ATRIAL APPENDAGE N/A 07/24/2017   Procedure: CLIPPING OF ATRIAL APPENDAGE using a 50mm Atricure clip;  Surgeon: Alleen Borne,  MD;  Location: [facility] OR;  Service: Open Heart Surgery;  Laterality: N/A;   CORONARY ARTERY BYPASS GRAFT N/A 07/24/2017   Procedure: CORONARY ARTERY BYPASS GRAFTING (CABG) times 3 using the left greater saphenous vein harvested endoscopically and left internal mammary artery.;  Surgeon: Alleen Borne, MD;  Location: [facility] OR;  Service: Open Heart Surgery;  Laterality: N/A;   EYE SURGERY Bilateral    lens replacements for cataracts   HERNIA REPAIR     INGUINAL HERNIA REPAIR Right 05/06/2015   Procedure: LAPAROSCOPIC RIGHT INGUINAL HERNIA WITH MESH;  Surgeon: Abigail Miyamoto, MD;  Location: [facility] ORS;  Service: General;  Laterality: Right;   INSERTION OF MESH Right 05/06/2015   Procedure: INSERTION OF MESH;  Surgeon: Abigail Miyamoto, MD;  Location: [facility] ORS;  Service: General;  Laterality: Right;   RIGHT/LEFT HEART CATH AND CORONARY ANGIOGRAPHY N/A 07/10/2017   Procedure: RIGHT/LEFT HEART CATH AND CORONARY ANGIOGRAPHY;  Surgeon: Elder Negus, MD;  Location: [facility] INVASIVE CV LAB;  Service: Cardiovascular;  Laterality: N/A;   TEE WITHOUT CARDIOVERSION N/A 07/11/2017   Procedure: TRANSESOPHAGEAL ECHOCARDIOGRAM (TEE);  Surgeon: Elder Negus, MD;  Location: [facility] ENDOSCOPY;  Service: Cardiovascular;  Laterality: N/A;   TEE WITHOUT CARDIOVERSION N/A 07/24/2017   Procedure: TRANSESOPHAGEAL ECHOCARDIOGRAM (TEE);  Surgeon: Alleen Borne, MD;  Location: [facility] OR;  Service: Open Heart Surgery;  Laterality: N/A;   TOTAL KNEE ARTHROPLASTY Right 08/07/2019   Procedure: TOTAL KNEE ARTHROPLASTY;  Surgeon: Ollen Gross, MD;  Location: [facility] ORS;  Service:  Orthopedics;  Laterality: Right;   ULTRASOUND GUIDANCE FOR VASCULAR ACCESS  07/10/2017   Procedure: Ultrasound Guidance For Vascular Access;  Surgeon: Elder Negus, MD;  Location: [facility] INVASIVE CV LAB;  Service: Cardiovascular;;    Family Hx:  Family History  Problem Relation Age of Onset   Stroke Mother    Heart attack Father     Social History:  reports  that he quit smoking about 51 years ago. His smoking use included cigarettes. He has a 12.00 pack-year smoking history. He has never used smokeless tobacco. He reports current alcohol use. He reports current drug use. Drug: Marijuana.  Allergies:  Allergies  Allergen Reactions   Sulfamethoxazole-Trimethoprim Rash    Medications: Prior to Admission medications   Medication Sig Start Date End Date Taking? Authorizing Provider  acetaminophen (TYLENOL) 500 MG tablet Take 2 tablets (1,000 mg total) by mouth every 6 (six) hours as needed for mild pain or fever. 07/29/17  Yes Barrett, Erin R, PA-C  amLODipine (NORVASC) 5 MG tablet Take 5 mg by mouth daily.   Yes [provider]  ascorbic acid (VITAMIN C) 500 MG tablet Take 1 tablet (500 mg total) by mouth daily. 03/25/22  Yes Ngetich, Dinah C, NP  aspirin EC 81 MG tablet Take 81 mg by mouth daily. Swallow whole.   Yes [provider]  Baclofen 5 MG TABS Take 1 tablet (5 mg total) by mouth 2 (two) times daily as needed. 06/21/22  Yes Raspet, Noberto Retort, PA-C  Cetirizine HCl (ZYRTEC ALLERGY) 10 MG CAPS Take 10 mg by mouth daily as needed (as needed for allergies). 05/09/22  Yes [provider]  lidocaine (LIDODERM) 5 % Place 1 patch onto the skin daily. Remove & Discard patch within 12 hours or as directed by MD 06/21/22  Yes Raspet, Noberto Retort, PA-C  Multiple Vitamin (MULTIVITAMIN ADULT PO) Take 1 tablet by mouth daily.   Yes [provider]  Omega-3 Fatty Acids (FISH OIL) 1000 MG CAPS Take 2 capsules by mouth daily.   Yes [provider]  Phenylephrine HCl (PREPARATION H RE) Place rectally as needed.   Yes [provider]  psyllium (METAMUCIL) 58.6 % powder Take 1 packet by mouth daily as needed (regularity (3-4 times a week)).    Yes [provider]  silodosin (RAPAFLO) 8 MG CAPS capsule Take 8 mg by mouth daily. 11/15/21  Yes [provider]    I have reviewed the patient's current  medications.  Labs:  Results for orders placed or performed during the hospital encounter of 06/23/22 (from the past 48 hour(s))  CBC with Differential/Platelet     Status: Abnormal   Collection Time: 06/26/22  3:28 AM  Result Value Ref Range   WBC 8.9 4.0 - 10.5 K/uL   RBC 3.31 (L) 4.22 - 5.81 MIL/uL   Hemoglobin 10.6 (L) 13.0 - 17.0 g/dL   HCT 16.1 (L) 09.6 - 04.5 %   MCV 91.5 80.0 - 100.0 fL   MCH 32.0 26.0 - 34.0 pg   MCHC 35.0 30.0 - 36.0 g/dL   RDW 40.9 81.1 - 91.4 %   Platelets 199 150 - 400 K/uL   nRBC 0.0 0.0 - 0.2 %   Neutrophils Relative % 91 %   Neutro Abs 8.2 (H) 1.7 - 7.7 K/uL   Lymphocytes Relative 3 %   Lymphs Abs 0.3 (L) 0.7 - 4.0 K/uL   Monocytes Relative 5 %   Monocytes Absolute 0.4 0.1 - 1.0 K/uL  Eosinophils Relative 0 %   Eosinophils Absolute 0.0 0.0 - 0.5 K/uL   Basophils Relative 0 %   Basophils Absolute 0.0 0.0 - 0.1 K/uL   Immature Granulocytes 1 %   Abs Immature Granulocytes 0.06 0.00 - 0.07 K/uL    Comment: Performed at [facility], [address], [place], [place] [address]  Brain natriuretic peptide     Status: Abnormal   Collection Time: 06/26/22  3:28 AM  Result Value Ref Range   B Natriuretic Peptide 777.5 (H) 0.0 - 100.0 pg/mL    Comment: Performed at [facility], [address], [place], [place] [address]  Basic metabolic panel     Status: Abnormal   Collection Time: 06/26/22  3:28 AM  Result Value Ref Range   Sodium 123 (L) 135 - 145 mmol/L   Potassium 4.1 3.5 - 5.1 mmol/L   Chloride 91 (L) 98 - 111 mmol/L   CO2 22 22 - 32 mmol/L   Glucose, Bld 124 (H) 70 - 99 mg/dL    Comment: Glucose reference range applies only to samples taken after fasting for at least 8 hours.   BUN 12 8 - 23 mg/dL   Creatinine, Ser 0.98 0.61 - 1.24 mg/dL   Calcium 8.3 (L) 8.9 - 10.3 mg/dL   GFR, Estimated >11 >91 mL/min    Comment: (NOTE) Calculated using the CKD-EPI Creatinine Equation (2021)    Anion gap 10 5 - 15    Comment: Performed at  [facility], [address], [place], [place] [address]  Magnesium     Status: None   Collection Time: 06/26/22  3:28 AM  Result Value Ref Range   Magnesium 2.0 1.7 - 2.4 mg/dL    Comment: Performed at [facility], [address], [place], [place] [address]  CBC with Differential/Platelet     Status: Abnormal   Collection Time: 06/27/22  4:36 AM  Result Value Ref Range   WBC 9.9 4.0 - 10.5 K/uL   RBC 3.37 (L) 4.22 - 5.81 MIL/uL   Hemoglobin 10.7 (L) 13.0 - 17.0 g/dL   HCT 08.6 (L) 57.8 - 46.9 %   MCV 91.4 80.0 - 100.0 fL   MCH 31.8 26.0 - 34.0 pg   MCHC 34.7 30.0 - 36.0 g/dL   RDW 62.9 52.8 - 41.3 %   Platelets 223 150 - 400 K/uL   nRBC 0.0 0.0 - 0.2 %   Neutrophils Relative % 90 %   Neutro Abs 9.0 (H) 1.7 - 7.7 K/uL   Lymphocytes Relative 3 %   Lymphs Abs 0.3 (L) 0.7 - 4.0 K/uL   Monocytes Relative 6 %   Monocytes Absolute 0.6 0.1 - 1.0 K/uL   Eosinophils Relative 0 %   Eosinophils Absolute 0.0 0.0 - 0.5 K/uL   Basophils Relative 0 %   Basophils Absolute 0.0 0.0 - 0.1 K/uL   Immature Granulocytes 1 %   Abs Immature Granulocytes 0.05 0.00 - 0.07 K/uL    Comment: Performed at [facility], [address] [place], [place] [address]  Brain natriuretic peptide     Status: Abnormal   Collection Time: 06/27/22  4:36 AM  Result Value Ref Range   B Natriuretic Peptide 514.4 (H) 0.0 - 100.0 pg/mL    Comment: Performed at [facility], [address] [place], [place] [address]  Basic metabolic panel     Status: Abnormal   Collection Time: 06/27/22  4:36 AM  Result Value Ref Range   Sodium 122 (L) 135 - 145 mmol/L   Potassium 4.5 3.5 - 5.1 mmol/L   Chloride 90 (L) 98 - 111 mmol/L   CO2 25 22 - 32 mmol/L   Glucose, Bld 120 (H) 70 - 99 mg/dL    Comment: Glucose reference range applies only to samples taken after fasting for at least 8 hours.   BUN 15 8 - 23 mg/dL   Creatinine, Ser 2.95 0.61 - 1.24 mg/dL   Calcium 8.3 (L) 8.9 - 10.3 mg/dL   GFR,  Estimated >62 >13 mL/min    Comment: (NOTE) Calculated using the CKD-EPI Creatinine Equation (2021)    Anion gap 7 5 - 15    Comment: Performed at [facility], [address] [place], [place] [address]  Magnesium     Status: None   Collection Time: 06/27/22  4:36 AM  Result Value Ref Range   Magnesium 1.9 1.7 - 2.4 mg/dL    Comment: Performed at [facility], [address], [place], [place] [address]     ROS:  Denies fevers, chills, chest pain, dyspnea, cough, abdominal pain, dysuria, polyuria, leg swelling  Physical Exam: Vitals:   06/26/22 2300 06/27/22 0400  BP: (!) 152/89 (!) 158/86  Pulse: 87 81  Resp: 17 18  Temp: 98.3 F (36.8 C) 98.1 F (36.7 C)  SpO2: 93% 94%     General: Elderly appearing person resting in char in no acute distress HEENT: Normocephalic, atraumatic. Moist mucous membranes. Eyes: Vision grossly in tac.t No scleral icterus or conjunctival injection. Neck: Supple. No JVD. Heart: Regular rate, rhythm. No murmurs.  Lungs: Normal work of breathing on room air. Clear to auscultation bilaterally. Abdomen: Soft, non-tender, non-distended. Normoactive bowel sounds. Extremities:No lower extremity edema.  Skin: Warm, dry. Good skin turgor on chest. Neuro: Awake, alert, conversing appropriately. Grossly in tact.   Assessment/Plan: Hyponatremia - Euvolemic on exam, appears this was the case on admission. Na has improved mildly with fluids, 117>123>122 this AM. Initial urine studies with Osm >500, Na 23. TSH has not been checked this admission, will place this. Patient is on dexamethasone for back pain, would be cautious to avoid glucocorticoid induced adrenal insufficiency. Primary team has ordered tolvaptan, would continue with fluid restriction as well. Repeat urine studies today, continue tracking BMP and urine output. Of note, patient did mention he is on thiazide at home, would make sure he avoids this once stable for discharge.  Low back pain  w/ severe degenerative changes: Pain control per primary. Sounds very functional at baseline, would likely do well with rehab. Continue with PT/OT.  Hypertension - Would avoid thiazides. BPH- Reports he takes silodosin 8mg  at home. Was told by urologist he could take this or tamsulosin 0.8mg  daily. I have added an additional dose of tamsulosin for today. He is to have a friend bring his home medication for tomorrow.    Evlyn Kanner 06/27/2022, 9:27 AM

## 2022-06-27 NOTE — Progress Notes (Signed)
Occupational Therapy Treatment Patient Details Name: [name] MRN: [id number] DOB: 01/27/1941 Today's Date: 06/27/2022   History of present illness [name] is a 82 y.o. male who presents with low back pain and constipation. Labs are most notable for sodium 117. CT of the abdomen and pelvis demonstrates increased stool burden and right hepatic lobe hypodensity.  MRI lumbar spine is notable for artifact versus cord edema at T11-T12.  PMHx: CAD status post CABG in 2019, bioprosthetic aortic valve replacement, cardiomyopathy with recovered EF and gout.   OT comments  Pt is making fair progress towards their acute OT goals. He remains significantly limited by pain and anxiety. Pt was perseverating on his ability to discharge safely and had self-distracting thoughts throughout despite maximal efforts to re-direct. Overall he requires mod A for sit<>stand transfers with RW due to pain. Pt tolerated standing at the sink for 1 grooming task but needed min A for balance due to "back spasms."  OT to continue to follow acutely to facilitate progress towards established goals. Due to limited progress, significant pain and limited assist at home, recommend skilled inpatient follow up therapy, <3 hours/day.     Recommendations for follow up therapy are one component of a multi-disciplinary discharge planning process, led by the attending physician.  Recommendations may be updated based on patient status, additional functional criteria and insurance authorization.    Assistance Recommended at Discharge Frequent or constant Supervision/Assistance  Patient can return home with the following  A lot of help with walking and/or transfers;A lot of help with bathing/dressing/bathroom;Assistance with cooking/housework;Assistance with feeding;Direct supervision/assist for medications management;Direct supervision/assist for financial management;Assist for transportation;Help with stairs or ramp for entrance    Equipment Recommendations  Other (comment)       Precautions / Restrictions Precautions Precautions: Fall Restrictions Weight Bearing Restrictions: No       Mobility Bed Mobility Overal bed mobility: Needs Assistance             General bed mobility comments: OOB upon arrival, returned to chair    Transfers Overall transfer level: Needs assistance Equipment used: Rolling walker (2 wheels) Transfers: Sit to/from Stand Sit to Stand: Mod assist           General transfer comment: from recliner and BSC     Balance Overall balance assessment: Needs assistance Sitting-balance support: Feet supported, Single extremity supported Sitting balance-Leahy Scale: Fair     Standing balance support: Bilateral upper extremity supported, During functional activity, Reliant on assistive device for balance Standing balance-Leahy Scale: Poor                             ADL either performed or assessed with clinical judgement   ADL Overall ADL's : Needs assistance/impaired     Grooming: Minimal assistance;Sitting Grooming Details (indicate cue type and reason): min A for balance in standing. pt needs at least 1 UE supported externally                 Toilet Transfer: Moderate assistance;Ambulation;BSC/3in1;Rolling walker (2 wheels) Toilet Transfer Details (indicate cue type and reason): mod A for STS with RW Toileting- Clothing Manipulation and Hygiene: Maximal assistance;Sit to/from stand       Functional mobility during ADLs: Moderate assistance;Rolling walker (2 wheels) General ADL Comments: pt very anxious this date and remains limited by pain and activity tolerance    Extremity/Trunk Assessment Upper Extremity Assessment Upper Extremity Assessment: Overall WFL for tasks assessed  Lower Extremity Assessment Lower Extremity Assessment: Defer to PT evaluation        Vision   Vision Assessment?: No apparent visual deficits   Perception  Perception Perception: Within Functional Limits   Praxis Praxis Praxis: Intact    Cognition Arousal/Alertness: Awake/alert Behavior During Therapy: Anxious Overall Cognitive Status: Within Functional Limits for tasks assessed               General Comments: significantly limited by anxious thought and his inability to safely d/c tomorrow. Pt perseverating on event with MD earlier in the day despite maximal efforts for re-direction              General Comments VSS on RA    Pertinent Vitals/ Pain       Pain Assessment Pain Assessment: Faces Faces Pain Scale: Hurts whole lot Pain Location: back with movement Pain Descriptors / Indicators: Discomfort, Grimacing, Guarding, Moaning, Spasm Pain Intervention(s): Limited activity within patient's tolerance, Monitored during session   Frequency  Min 2X/week        Progress Toward Goals  OT Goals(current goals can now be found in the care plan section)  Progress towards OT goals: Progressing toward goals  Acute Rehab OT Goals Patient Stated Goal: to go to rehab OT Goal Formulation: With patient Time For Goal Achievement: 07/09/22 Potential to Achieve Goals: Good ADL Goals Pt Will Perform Grooming: with supervision;standing Pt Will Perform Upper Body Dressing: Independently Pt Will Perform Lower Body Dressing: with supervision;sit to/from stand Pt Will Transfer to Toilet: with supervision;ambulating;regular height toilet Additional ADL Goal #1: Pt will complete bed mobility independently as a precursor to ADLs  Plan Discharge plan needs to be updated       AM-PAC OT "6 Clicks" Daily Activity     Outcome Measure   Help from another person eating meals?: None Help from another person taking care of personal grooming?: A Little Help from another person toileting, which includes using toliet, bedpan, or urinal?: A Lot Help from another person bathing (including washing, rinsing, drying)?: A Lot Help from another  person to put on and taking off regular upper body clothing?: A Little Help from another person to put on and taking off regular lower body clothing?: A Lot 6 Click Score: 16    End of Session Equipment Utilized During Treatment: Gait belt;Rolling walker (2 wheels)  OT Visit Diagnosis: Unsteadiness on feet (R26.81);Other abnormalities of gait and mobility (R26.89);Muscle weakness (generalized) (M62.81);Pain   Activity Tolerance Patient limited by pain   Patient Left in chair;with call bell/phone within reach   Nurse Communication Mobility status        Time: 1610-9604 OT Time Calculation (min): 30 min  Charges: OT General Charges $OT Visit: 1 Visit OT Treatments $Self Care/Home Management : 23-37 mins  Derenda Mis, OTR/L Acute Rehabilitation Services Office [phone] Secure Chat Communication Preferred   Donia Pounds 06/27/2022, 3:43 PM

## 2022-06-27 NOTE — TOC Initial Note (Signed)
Transition of Care [facility]) - Initial/Assessment Note    Patient Details  Name: [name] MRN: [id number] Date of Birth: 1940-03-22  Transition of Care [facility]) CM/SW Contact:    Mearl Latin, LCSW Phone Number: 06/27/2022, 4:41 PM  Clinical Narrative:                 CSW received consult for possible SNF placement at time of discharge. CSW spoke with patient and two friends at bedside with his verbal permission. He reported that he lives home alone and given patient's current physical needs and fall risk he feels rehab is necessary. Patient expressed understanding of PT recommendation and is agreeable to SNF placement at time of discharge. CSW discussed insurance authorization process and provided Medicare SNF ratings list. CSW presented SNF bed offers to patient. He has selected [facility]. CSW submitted for insurance authorization and received approval, Ref# [id number], effective 06/28/2022-06/30/2022. Patient is requesting PTAR for transport.   Skilled Nursing Rehab Facilities-   [email or web address]   Ratings out of 5 stars (5 the highest)   Name Address  Phone # Quality Care Staffing Health Inspection Overall  [facility] [address] [phone] 2 1 5 4   [facility] [address], [place] [phone] 4 1 3 2   [facility] [address], [facility] [phone] [facility] [address] [place] [phone] 4 1 3 2   [facility]  [address], [place] [phone] 3 2 5 5   [facility] [address] [facility] [phone] 2 1 2 1   [facility] [address] [place] [phone] 4 1 2 1   [facility] [phone] [address], [place] [phone] 2 4 3 3   [address] ([place]) [address] [place] [phone] 3 2 2 2   [facility] [address] [place], [place] [phone] 1 2 1 1   [facility] [address], [place] [phone] 3 1 1 1   [facility] [address] Liliane Shi [phone] 4 3 4 4   [facility] [address] [place] [phone] 3 4 3 3           [facility] [address], [place] [phone]      [facility], [place] [address], [place] [phone] 1 1 2 1   [facility] [address] [place] [phone] 2 2 4 4   [facility]  [address] [place]ree Ditto [phone] 2 1 4 3   [facility] [address] [place] [phone] 3 3 3 3           [address] (no [facility]) [address], [place] [phone] 4 4 5 5   [facility] (No [employer]) [address] [place], [place] [phone] 2 2 4 4   [facility] [address], [place] [phone] 2 1 2 1   [facility] (No [employer]) [address] [place] [phone] 5 5 5 5   [facility] [address], [place] [phone] 2 3 5 5   [facility] [address] [place] [phone] 2 1 1 1   [facility] [address] Lil[place] [phone] 5 2 5 5   [facility]  [address], [place] [phone] 2 2 2 2   [facility] [address] [place] [phone] 4 2 1 1   [facility] [address] [place], [place] [phone] 2 2 3 3           [facility] [address], [place] [phone]  1 1 1 1   [facility] [address] Evlyn Clines  [phone] 2 3 3 3   [facility] (No Hum[employer]) [address], [place] [phone] 2 1 3 2   [facility]) [address], [place]an [phone] 1 1 1 1   [facility] [address], [place] [phone] 3 2 5 5   [facility] [address] [place] [address], [place] [phone] 2 1 1 1           [facility] [address] [place], [place] [phone] 4 4 5 5   [facility])  [address], [place] [phone] 2 1 2 1   [facility]) [address] [place] [phone]  1 4 3    [facility] [address], [place] [phone] 3 5 4 5   [address] [place], [place] [phone] 3 2 2 2   [facility]) [address] [place] [phone] 2 1 3 2      Expected Discharge Plan: Skilled Nursing Facility Barriers to Discharge: Continued Medical Work up   Patient Goals and CMS Choice Patient states their goals for this hospitalization and ongoing recovery are:: Rehab CMS Medicare.gov Compare Post Acute Care list provided to:: Patient Choice offered to / list presented to : Patient [place] ownership interest in [facility].provided to:: Patient    Expected Discharge Plan and Services In-house Referral: Clinical Social Work   Post Acute Care Choice: Skilled Nursing Facility Living arrangements for the past 2 months: Single Family Home                                      Prior Living Arrangements/Services Living arrangements for the past 2 months: Single Family Home Lives with:: Self Patient language and need for interpreter reviewed:: Yes Do you feel safe going back to the place where you live?: Yes      Need for Family Participation in Patient Care: No (Comment) Care giver support system in place?: Yes (comment)   Criminal Activity/Legal Involvement Pertinent to Current Situation/Hospitalization: No - Comment as needed  Activities of Daily Living      Permission Sought/Granted Permission sought to share information with : Facility Medical sales representative, Family Supports Permission granted to share information with : Yes, Verbal Permission Granted     Permission granted to share info w AGENCY: SNFs        Emotional Assessment Appearance:: Appears stated age Attitude/Demeanor/Rapport: Engaged Affect (typically observed): Accepting, Appropriate, Pleasant Orientation: : Oriented to Self, Oriented to Place, Oriented to  Time, Oriented to Situation Alcohol / Substance Use:  Not Applicable Psych Involvement: No (comment)  Admission diagnosis:  Hyponatremia [E87.1] Patient Active Problem List   Diagnosis Date Noted   Hyponatremia 06/24/2022   Low back pain 06/24/2022   Constipation 06/24/2022   Renal cyst, 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   PAC (premature atrial contraction) 03/01/2021   PVC (premature ventricular contraction) 03/01/2021   Atrial bigeminy 02/28/2020   OA (osteoarthritis) of knee 08/07/2019   Primary osteoarthritis of right knee 08/07/2019   Coronary artery disease involving coronary bypass graft of native heart without angina pectoris 09/19/2018   Fatigue 08/01/2018   Mixed hyperlipidemia 06/06/2018   S/P left atrial appendage ligation 07/25/2017   S/P AVR 07/25/2017   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   Coronary artery disease without angina pectoris 07/12/2017   Mitral regurgitation 07/07/2017   (HFpEF) heart  failure with preserved ejection fraction (HCC) 07/07/2017   Gout 02/12/2013   Essential hypertension 02/12/2013   GERD (gastroesophageal reflux disease) 02/12/2013   PCP:  Caesar Bookman, NP Pharmacy:   [phone] [place], [place] - [address] [place] [address] Phone: [phone] Fax: [phone]     Social Determinants of Health (SDOH) Social History: SDOH Screenings   Food Insecurity: No Food Insecurity (05/13/2022)  Housing: Low Risk  (05/13/2022)  Transportation Needs: No Transportation Needs (05/13/2022)  Utilities: Not At Risk (05/13/2022)  Alcohol Screen: Low Risk  (05/13/2022)  Depression (PHQ2-9): Low Risk  (06/03/2022)  Financial Resource Strain: Low Risk  (05/13/2022)  Physical Activity: Sufficiently Active (05/13/2022)  Social Connections: Socially Integrated (05/13/2022)  Stress: No Stress Concern Present (05/13/2022)  Tobacco Use: Medium Risk (06/24/2022)   SDOH Interventions:     Readmission Risk Interventions     No data to display

## 2022-06-27 NOTE — Care Management Important Message (Signed)
Important Message  Patient Details  Name: [name] MRN: [id number] Date of Birth: 07/30/1940   Medicare Important Message Given:  Yes     Mardene Sayer 06/27/2022, 2:03 PM

## 2022-06-27 NOTE — Progress Notes (Signed)
PROGRESS NOTE                                                                                                                                                                                                             Patient Demographics:    [name], is a 82 y.o. male, DOB - February 10, 1940, ZOX:[id number]  Outpatient Primary MD for the patient is Ngetich, Dinah C, NP    LOS - 3  Admit date - 06/23/2022    Chief Complaint  Patient presents with   Back Pain   Constipation       Brief Narrative (HPI from H&P)   82 y.o. male with medical history significant for CAD status post CABG in 2019, bioprosthetic aortic valve replacement, cardiomyopathy with recovered EF, and gout who presents to the emergency department for evaluation of low back pain and constipation.    Subjective:   Patient in bed, appears comfortable, denies any headache, no fever, no chest pain or pressure, no shortness of breath , no abdominal pain. No focal weakness.  Low back pain improved.  Constipation better.   Assessment  & Plan :    Severe hyponatremia in the setting of ongoing low back pain.  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  He has no focal neurological deficits, no signs suggestive of cauda equina, improved after low-dose IV steroids started on 06/25/2022, reviewed images with neurosurgery Dr. Clydell Hakim, supportive care, PT-OT and monitor.   Incidental finding of 4.5 cm liver hypodensity.  No tenderness on palpation.  Outpatient follow-up with PCP and gastroenterology.  Incidental renal cyst  noted on MRI.  Outpatient dedicated CT renal protocol per PCP.  Constipation.  Resolved after bowel regimen, continue to monitor.       Condition -   Guarded  Family Communication  :  None present  Code Status :  Full  Consults  : Reviewed T and L-spine images with neurosurgery on-call, Clydell Hakim.  Outpatient follow-up.  Agrees with Decadron.  PUD Prophylaxis :    Procedures  :  CT - 1. Moderate hiatal hernia. 2. Increased stool burden within the ascending colon and transverse colon. Correlate for constipation. 3. Indeterminate right posterior hepatic lobe 4.5 cm triangular hypodensity. Correlate with point tenderness to palpation and recent trauma-could possibly represent a hepatic laceration with no active bleed. Comparison with prior cross-sectional imaging would be of value.  MRI L Spine - 1. Question T2/STIR signal abnormality involving the distal cord at the level of T11-12. While this finding may be artifactual in nature, possible cord signal changes/edema is difficult to exclude. Further evaluation with dedicated MRI of the thoracic spine recommended for further evaluation. 2. Multilevel lumbar spondylosis with resultant mild to moderate diffuse lateral recess and spinal stenosis at L1-2 through L4-5 as above. No high-grade spinal stenosis within the lumbar spine itself. 3. Superimposed left foraminal to extraforaminal disc protrusions at L3-4 and L4-5, potentially affecting the exiting left L3 and L4 nerve roots respectively. 4. Mild to moderate bilateral L3 through L5 foraminal stenosis related to disc bulge, reactive endplate changes, and facet hypertrophy. 5. 9 mm mildly complex cyst with intrinsic T1 hyperintensity within the interpolar right kidney, indeterminate. Further evaluation with dedicated renal mass protocol CT and/or MRI recommended. This could be performed on a nonemergent outpatient basis.   MRI T Spine - 1. No acute osseous abnormality in the thoracic  spine. 2. Widespread thoracic disc degeneration and posterior element hypertrophy. Small right paracentral disc extrusion at T11-T12, which perhaps is more acute than the remaining degenerative findings. Mild spinal stenosis and mild ventral spinal cord mass effect at that level. No cord signal abnormality. 3. Other mild multifactorial thoracic spinal stenosis T6-T7, T7-T8, T9-T10 and T10-T11. Mild additional cord mass effect but no thoracic cord signal abnormality. 4. Up to moderate occasional degenerative neural foraminal stenosis: Right T1, right T2, and right T10 nerve levels. 5. Partially visible advanced lower cervical spine degeneration with evidence of C5-C6 spinal stenosis. 6. Small layering pleural effusions and dependent left lung opacity. See CT Abdomen and Pelvis from earlier today.       Disposition Plan  :    Status is: Inpatient   DVT Prophylaxis  :    enoxaparin (LOVENOX) injection 40 mg Start: 06/24/22 1000    Lab Results  Component Value Date   PLT 223 06/27/2022    Diet :  Diet Order             Diet regular Room service appropriate? Yes; Fluid consistency: Thin  Diet effective now                    Inpatient Medications  Scheduled Meds:  aspirin EC  81 mg Oral Daily   dexamethasone (DECADRON) injection  4 mg Intravenous Q24H   diclofenac Sodium  2 g Topical QID   docusate sodium  200 mg Oral BID   enoxaparin (LOVENOX) injection  40 mg Subcutaneous Q24H   lidocaine  1 patch Transdermal Q24H   sodium chloride flush  3 mL Intravenous Q12H   tamsulosin  0.4 mg Oral QPC breakfast   Continuous Infusions:   PRN Meds:.acetaminophen **OR** acetaminophen, methocarbamol, oxyCODONE, senna-docusate  Antibiotics  :    Anti-infectives (From admission, onward)    None         Objective:   Vitals:   06/26/22 2100 06/26/22 2300 06/27/22 0300 06/27/22 0400  BP: (!) 159/99 (!) 152/89  (!) 158/86  Pulse: 76 87  81  Resp: 16 17  18   Temp: 98 F (36.7  C) 98.3 F (36.8 C)  98.1 F (36.7 C)  TempSrc: Oral Oral  Axillary  SpO2: 92% 93%  94%  Weight:   78.2 kg   Height:        Wt Readings from Last 3 Encounters:  06/27/22 78.2 kg  06/03/22 75.7 kg  05/13/22 75.3 kg     Intake/Output Summary (Last 24 hours) at 06/27/2022 0838 Last data filed at 06/26/2022 2300 Gross per 24 hour  Intake --  Output 650 ml  Net -650 ml     Physical Exam  Awake Alert, No new F.N deficits, Normal affect [place].AT,PERRAL Supple Neck, No JVD,   Symmetrical Chest wall movement, Good air movement bilaterally, CTAB RRR,No Gallops,Rubs or new Murmurs,  +ve B.Sounds, Abd Soft, No tenderness,   No Cyanosis, Clubbing or edema        Data Review:    Recent Labs  Lab 06/23/22 2324 06/24/22 0625 06/25/22 0358 06/25/22 0548 06/26/22 0328 06/27/22 0436  WBC 10.1 9.4 10.2 10.3 8.9 9.9  HGB 11.4* 11.4* 10.3* 10.3* 10.6* 10.7*  HCT 32.8* 32.3* 28.5* 29.0* 30.3* 30.8*  PLT 167 171 173 183 199 223  MCV 92.7 90.7 89.1 89.0 91.5 91.4  MCH 32.2 32.0 32.2 31.6 32.0 31.8  MCHC 34.8 35.3 36.1* 35.5 35.0 34.7  RDW 11.8 11.8 11.7 11.9 11.8 11.9  LYMPHSABS 0.4*  --   --   --  0.3* 0.3*  MONOABS 0.9  --   --   --  0.4 0.6  EOSABS 0.0  --   --   --  0.0 0.0  BASOSABS 0.0  --   --   --  0.0 0.0    Recent Labs  Lab 06/23/22 2324 06/24/22 0625 06/24/22 1607 06/24/22 2243 06/25/22 0358 06/25/22 0548 06/26/22 0328 06/27/22 0436  NA 117*   < > 120* 120* 121*  --  123* 122*  K 4.0   < > 4.4 4.5 4.4  --  4.1 4.5  CL 83*   < > 87* 87* 89*  --  91* 90*  CO2 21*   < > 21* 24 23  --  22 25  ANIONGAP 13   < > 12 9 9   --  10 7  GLUCOSE 107*   < > 147* 121* 111*  --  124* 120*  BUN 12   < > 11 12 11   --  12 15  CREATININE 0.76   < > 0.79 0.74 0.77  --  0.70 0.73  AST 19  --   --   --   --   --   --   --   ALT 21  --   --   --   --   --   --   --   ALKPHOS 50  --   --   --   --   --   --   --   BILITOT 1.3*  --   --   --   --   --   --   --   ALBUMIN 3.0*   --   --   --   --   --   --   --   BNP  --   --   --   --   --  878.6* 777.5* 514.4*  MG  --   --   --   --  1.9 1.9 2.0 1.9  CALCIUM 8.6*   < > 8.4* 8.2* 8.2*  --  8.3* 8.3*   < > = values in this interval not displayed.      Recent Labs  Lab 06/24/22 1607 06/24/22 2243 06/25/22 0358 06/25/22 0548 06/26/22 0328 06/27/22 0436  BNP  --   --   --  878.6* 777.5* 514.4*  MG  --   --  1.9 1.9 2.0 1.9  CALCIUM 8.4* 8.2* 8.2*  --  8.3* 8.3*    Recent Labs  Lab 06/24/22 0625 06/24/22 1233 06/24/22 1607 06/24/22 2243 06/25/22 0358 06/25/22 0548 06/26/22 0328 06/27/22 0436  WBC 9.4  --   --   --  10.2 10.3 8.9 9.9  PLT 171  --   --   --  173 183 199 223  CREATININE 0.73   < > 0.79 0.74 0.77  --  0.70 0.73   < > = values in this interval not displayed.    ------------------------------------------------------------------------------------------------------------------ Lab Results  Component Value Date   CHOL 173 03/23/2022   HDL 73 03/23/2022   LDLCALC 86 03/23/2022   TRIG 62 03/23/2022   CHOLHDL 2.4 03/23/2022    Lab Results  Component Value Date   HGBA1C 5.7 (H) 07/20/2017    No results for input(s): "TSH", "T4TOTAL", "T3FREE", "THYROIDAB" in the last 72 hours.  Invalid input(s): "FREET3" ------------------------------------------------------------------------------------------------------------------ Cardiac Enzymes No results for input(s): "CKMB", "TROPONINI", "MYOGLOBIN" in the last 168 hours.  Invalid input(s): "CK"  Micro Results No results found for this or any previous visit (from the past 240 hour(s)).  Radiology Reports DG Chest Port 1 View  Result Date: 06/26/2022 CLINICAL DATA:  Shortness of breath EXAM: PORTABLE CHEST 1 VIEW COMPARISON:  08/28/2017 FINDINGS: Prior median sternotomy and valve replacement. Heart is borderline in size. Mediastinal contours within normal limits. Aortic atherosclerosis. No confluent airspace opacities, effusions or  edema. No acute bony abnormality. IMPRESSION: No active disease. Electronically Signed   By: Charlett Nose M.D.   On: 06/26/2022 08:25   MR THORACIC SPINE WO CONTRAST  Result Date: 06/24/2022 CLINICAL DATA:  82 year old male with mid back pain. Evidence of mild lower thoracic spinal stenosis on lumbar MRI yesterday. EXAM: MRI THORACIC SPINE WITHOUT CONTRAST TECHNIQUE: Multiplanar, multisequence MR imaging of the thoracic spine was performed. No intravenous contrast was administered. COMPARISON:  CT Abdomen and Pelvis 0044 hours today. Lumbar MRI yesterday. FINDINGS: Limited cervical spine imaging: Lower cervical spine advanced disc and endplate degeneration with mild suspected spondylolisthesis (series 16, image 6). Evidence of C5-C6 spinal stenosis. Thoracic spine segmentation: Appears normal, and concordant with the lumbar spine numbering yesterday. Alignment: Mildly exaggerated upper thoracic kyphosis. Subtle anterolisthesis such as T2 on T3. Some straightening of the lower thoracic kyphosis. Mildly exaggerated kyphosis also at the thoracolumbar junction. No significant scoliosis. Vertebrae: Normal background bone marrow signal. Scattered chronic thoracic endplate degeneration and/or small Schmorl's nodes. No marrow edema or evidence of acute osseous abnormality. Visible posterior ribs appear intact. Cord: No thoracic spinal cord signal abnormality despite some levels of degenerative spinal cord mass effect which are detailed below. Conus medullaris appears spared at T12-L1. Paraspinal and other soft tissues: Dependent left lung opacity and small layering bilateral pleural effusions. Superimposed moderate gastric hiatal hernia. See CT Abdomen and Pelvis from earlier today. Negative other visible chest and upper abdominal viscera. Thoracic paraspinal soft tissues remain within normal limits. Disc levels: T1-T2: Minor disc bulging. Mild to moderate facet hypertrophy. No spinal stenosis. Moderate right T1 neural  foraminal stenosis. T2-T3: Mild anterolisthesis with disc bulging. Mild to moderate facet hypertrophy greater on  the right. No spinal stenosis. Moderate right T2 neural foraminal stenosis. T3-T4: Possible right side facet ankylosis at this level. Moderate facet hypertrophy there and moderate to severe right T3 neural foraminal stenosis. But no disc degeneration or spinal stenosis. T4-T5: Mild facet and ligament flavum hypertrophy. No significant stenosis. T5-T6: Mostly anterior disc bulging and endplate spurring. No stenosis. T6-T7: Circumferential disc bulge with a broad-based posterior component (series 22, image 20) effacing the ventral CSF space. Borderline to mild spinal stenosis when combined with mild facet and ligament flavum hypertrophy. Mild if any spinal cord mass effect. No foraminal stenosis. T7-T8: More focal left paracentral disc or disc osteophyte complex on series 22, image 23. Mild posterior element hypertrophy. Borderline to mild spinal stenosis and mild left ventral cord mass effect. T8-T9: Small posterior disc bulge or protrusion on series 22, image 27. Mild posterior element hypertrophy. No significant stenosis. T9-T10: Circumferential disc bulging. Mild posterior element hypertrophy. Borderline to mild spinal stenosis. T10-T11: Disc bulging and small right paracentral disc or disc osteophyte component on series 22, image 32. Up to moderate posterior element hypertrophy. Mild spinal stenosis. Mild if any spinal cord mass effect. Up to mild left and mild to moderate right T10 neural foraminal stenosis. T11-T12: Circumferential disc bulge with larger right paracentral disc extrusion (series 19, image 10 and series 22, image 35) with mild posterior element hypertrophy. Mild spinal stenosis and ventral cord mass effect. No cord signal abnormality. No foraminal stenosis. T12-L1: Negative. IMPRESSION: 1. No acute osseous abnormality in the thoracic spine. 2. Widespread thoracic disc degeneration and  posterior element hypertrophy. Small right paracentral disc extrusion at T11-T12, which perhaps is more acute than the remaining degenerative findings. Mild spinal stenosis and mild ventral spinal cord mass effect at that level. No cord signal abnormality. 3. Other mild multifactorial thoracic spinal stenosis T6-T7, T7-T8, T9-T10 and T10-T11. Mild additional cord mass effect but no thoracic cord signal abnormality. 4. Up to moderate occasional degenerative neural foraminal stenosis: Right T1, right T2, and right T10 nerve levels. 5. Partially visible advanced lower cervical spine degeneration with evidence of C5-C6 spinal stenosis. 6. Small layering pleural effusions and dependent left lung opacity. See CT Abdomen and Pelvis from earlier today. Electronically Signed   By: Odessa Fleming M.D.   On: 06/24/2022 04:50   CT ABDOMEN PELVIS W CONTRAST  Result Date: 06/24/2022 CLINICAL DATA:  Bowel obstruction suspected With a history of gout, hypertension, CAD, CHF, hepatitis who presents to the ED for evaluation of low back pain. States this began abruptly 2 days ago. He was active, played a round of disc golf and went on 2 bike rides EXAM: CT ABDOMEN AND PELVIS WITH CONTRAST TECHNIQUE: Multidetector CT imaging of the abdomen and pelvis was performed using the standard protocol following bolus administration of intravenous contrast. RADIATION DOSE REDUCTION: This exam was performed according to the departmental dose-optimization program which includes automated exposure control, adjustment of the mA and/or kV according to patient size and/or use of iterative reconstruction technique. CONTRAST:  75mL OMNIPAQUE IOHEXOL 350 MG/ML SOLN COMPARISON:  None Available. FINDINGS: Lower chest: Moderate volume hiatal hernia. Coronary artery calcifications. Hepatobiliary: Indeterminate right posterior hepatic lobe 4.5 cm triangular hypodensity. Finding unchanged on delayed view. Hypodense lesion of the left hepatic lobe likely a simple  hepatic cyst. Contracted gallbladder. No gallstones, gallbladder wall thickening, or pericholecystic fluid. No biliary dilatation. Pancreas: No focal lesion. Normal pancreatic contour. No surrounding inflammatory changes. No main pancreatic ductal dilatation. Spleen: Normal in size without focal abnormality.  Adrenals/Urinary Tract: No adrenal nodule bilaterally. Nonspecific bilateral perinephric fat stranding. Bilateral kidneys enhance symmetrically. No hydronephrosis. No hydroureter. No nephroureterolithiasis. Fluid density lesion of the right kidney likely represents a simple renal cyst. Simple renal cysts, in the absence of clinically indicated signs/symptoms, require no independent follow-up. The urinary bladder is unremarkable. On delayed imaging, there is no urothelial wall thickening and there are no filling defects in the opacified portions of the bilateral collecting systems or ureters. Stomach/Bowel: Stomach is within normal limits. Increased stool burden within the ascending colon. Stool throughout the transverse colon. Fecalized material within the distal small bowel lumen suggestive of an incompetent ileocecal valve versus slow transition. No evidence of bowel wall thickening or dilatation. Appendix appears normal. Vascular/Lymphatic: No abdominal aorta or iliac aneurysm. Severe atherosclerotic plaque of the aorta and its branches. No abdominal, pelvic, or inguinal lymphadenopathy. Reproductive: Prostate is unremarkable. Other: No intraperitoneal free fluid. No intraperitoneal free gas. No organized fluid collection. Musculoskeletal: CP. No suspicious lytic or blastic osseous lesions. No acute displaced fracture. Multilevel degenerative changes of the spine. IMPRESSION: 1. Moderate hiatal hernia. 2. Increased stool burden within the ascending colon and transverse colon. Correlate for constipation. 3. Indeterminate right posterior hepatic lobe 4.5 cm triangular hypodensity. Correlate with point  tenderness to palpation and recent trauma-could possibly represent a hepatic laceration with no active bleed. Comparison with prior cross-sectional imaging would be of value. Electronically Signed   By: Tish Frederickson M.D.   On: 06/24/2022 01:26   MR Lumbar Spine Wo Contrast  Result Date: 06/24/2022 CLINICAL DATA:  Initial evaluation for low back pain, cauda equina syndrome suspected. EXAM: MRI LUMBAR SPINE WITHOUT CONTRAST TECHNIQUE: Multiplanar, multisequence MR imaging of the lumbar spine was performed. No intravenous contrast was administered. COMPARISON:  Radiograph from 06/23/2022 and MRI from 12/27/2011. FINDINGS: Segmentation: Standard. Lowest well-formed disc space labeled the L5-S1 level. Alignment: Trace grade 1 degenerative stepwise retrolisthesis of L1 on L2 through L3 on L4. Additional trace 3 mm retrolisthesis of L5 on S1. Vertebrae: Vertebral body height maintained without acute or chronic fracture. Bone marrow signal intensity within normal limits. No worrisome osseous lesions. Scattered reactive endplate changes with marrow edema present within the lumbar spine, most pronounced at L5-S1. Conus medullaris and cauda equina: Conus extends to the T12-L1 level. There is question of T2/STIR signal abnormality involving the distal cord at the level of T11-12 (series 6, image 9). This is not well seen on corresponding T2 weighted sequence, and could potentially be artifactual. Nerve roots of the cauda equina within normal limits. Paraspinal and other soft tissues: Paraspinous soft tissues within normal limits. 9 mm mildly complex cyst with intrinsic T1 hyperintensity partially visualize within the interpolar right kidney, indeterminate. Disc levels: T11-12: Seen only on sagittal projection. Disc desiccation with diffuse disc bulge. Small central disc protrusion with slight superior migration. Mild cord flattening without cord signal changes. No significant spinal stenosis. Foramina remain patent.  T12-L1: Disc desiccation without significant disc bulge. No stenosis. L1-2: Degenerative intervertebral disc space narrowing with retrolisthesis. Diffuse disc bulge with reactive endplate spurring. Mild facet hypertrophy. Resultant mild bilateral subarticular stenosis. Central canal remains patent. Mild right L1 foraminal narrowing. Left neural foramen remains patent. L2-3: Degenerate intervertebral disc space narrowing with retrolisthesis. Diffuse disc bulge with reactive endplate spurring. Mild facet and ligament flavum hypertrophy. Resultant mild bilateral subarticular stenosis. Central canal remains patent. Mild to moderate bilateral L2 foraminal narrowing. L3-4: Degenerate intervertebral disc space narrowing with diffuse disc bulge and disc desiccation. Reactive endplate spurring.  Superimposed left foraminal to extraforaminal disc protrusion contacts the exiting left L3 nerve root. Mild bilateral facet hypertrophy. Resultant mild bilateral subarticular stenosis. Central canal remains patent. Mild to moderate bilateral L3 foraminal narrowing. L4-5: Degenerate intervertebral disc space narrowing with diffuse disc bulge. Reactive endplate spurring. Superimposed left foraminal disc protrusion contacts the exiting left L4 nerve root. Mild-to-moderate bilateral facet hypertrophy. Resultant mild-to-moderate canal with bilateral subarticular stenosis. Mild to moderate bilateral L4 foraminal narrowing. L5-S1: Degenerate intervertebral disc space narrowing with diffuse disc bulge and reactive endplate spurring. Moderate bilateral facet hypertrophy. Resultant moderate narrowing of the lateral recesses bilaterally. Central canal remains patent. Moderate bilateral L5 foraminal stenosis. IMPRESSION: 1. Question T2/STIR signal abnormality involving the distal cord at the level of T11-12. While this finding may be artifactual in nature, possible cord signal changes/edema is difficult to exclude. Further evaluation with  dedicated MRI of the thoracic spine recommended for further evaluation. 2. Multilevel lumbar spondylosis with resultant mild to moderate diffuse lateral recess and spinal stenosis at L1-2 through L4-5 as above. No high-grade spinal stenosis within the lumbar spine itself. 3. Superimposed left foraminal to extraforaminal disc protrusions at L3-4 and L4-5, potentially affecting the exiting left L3 and L4 nerve roots respectively. 4. Mild to moderate bilateral L3 through L5 foraminal stenosis related to disc bulge, reactive endplate changes, and facet hypertrophy. 5. 9 mm mildly complex cyst with intrinsic T1 hyperintensity within the interpolar right kidney, indeterminate. Further evaluation with dedicated renal mass protocol CT and/or MRI recommended. This could be performed on a nonemergent outpatient basis. Electronically Signed   By: Rise Mu M.D.   On: 06/24/2022 01:24   DG Lumbar Spine Complete  Result Date: 06/23/2022 CLINICAL DATA:  Pain EXAM: LUMBAR SPINE - COMPLETE 4+ VIEW COMPARISON:  Lumbar spine x-ray 06/21/2022 FINDINGS: There is no evidence of lumbar spine fracture. Alignment is normal. There is moderate disc space narrowing at all levels of the lumbar spine with endplate osteophyte formation compatible with degenerative change. There is a very large amount of stool throughout the colon. There are atherosclerotic calcifications of the aorta. IMPRESSION: 1. No acute fracture or dislocation. 2. Moderate degenerative changes throughout the lumbar spine. 3. Very large amount of stool throughout the colon. Electronically Signed   By: Darliss Cheney M.D.   On: 06/23/2022 20:39      Signature  -   Susa Raring M.D on 06/27/2022 at 8:38 AM   -  To page go to [email or web address]

## 2022-06-27 NOTE — Progress Notes (Signed)
Physical Therapy Treatment Patient Details Name: [name] MRN: [id number] DOB: 1941-01-25 Today's Date: 06/27/2022   History of Present Illness [name] is a 82 y.o. male who presents with low back pain and constipation. Labs are most notable for sodium 117. CT of the abdomen and pelvis demonstrates increased stool burden and right hepatic lobe hypodensity.  MRI lumbar spine is notable for artifact versus cord edema at T11-T12.  PMHx: CAD status post CABG in 2019, bioprosthetic aortic valve replacement, cardiomyopathy with recovered EF and gout.    PT Comments    Pt continues to experience back spasms and 10/10 back pain with mobility requiring modA for bed mobility and sit to stand transfer. Pt only able to amb 10' with RW at slow place and dependence on UEs on RW due to back pain. PTA pt was indep, cycles, played disc golf, and went to the gym. Pt currently lives alone. Pending planned day of d/c pt may need short term rehab to achieve safe mod I level of function for safe transition home alone. Pt does have support system however friends are elderly and unable to provide modA to assist pt in/out of bed and up to RW. Suspect once pain is tolerable and and pt no longer experiences back spasms pt will progress well. Acute PT to cont to follow.    Recommendations for follow up therapy are one component of a multi-disciplinary discharge planning process, led by the attending physician.  Recommendations may be updated based on patient status, additional functional criteria and insurance authorization.  Follow Up Recommendations       Assistance Recommended at Discharge Frequent or constant Supervision/Assistance (initially)  Patient can return home with the following A lot of help with walking and/or transfers;A little help with bathing/dressing/bathroom;Assistance with cooking/housework;Assist for transportation;Help with stairs or ramp for entrance   Equipment Recommendations   (has  rollator at home)    Recommendations for Other Services       Precautions / Restrictions Precautions Precautions: Fall Restrictions Weight Bearing Restrictions: No     Mobility  Bed Mobility Overal bed mobility: Needs Assistance Bed Mobility: Sidelying to Sit, Rolling Rolling: Min assist Sidelying to sit: Mod assist       General bed mobility comments: pt attempting to complete on own, was able to roll to the L without physical assist with increased time howeve required modA for trunk elevation. pt with onset of muscle spasms with any movement causing excruciating pain    Transfers Overall transfer level: Needs assistance Equipment used: Rolling walker (2 wheels) Transfers: Sit to/from Stand Sit to Stand: Mod assist           General transfer comment: bed height raised, modA to power up pushing up with L UE on bed and R UE on walker, 3 attempts made prior to successfully standing up, pt screaming out in pain during sit to stand    Ambulation/Gait Ambulation/Gait assistance: Min assist Gait Distance (Feet): 10 Feet Assistive device: Rolling walker (2 wheels) Gait Pattern/deviations: Step-to pattern, Decreased stride length, Trunk flexed, Narrow base of support Gait velocity: Decreased Gait velocity interpretation: <1.8 ft/sec, indicate of risk for recurrent falls   General Gait Details: pt very dependent on UEs through RW, short shuffled steps with pt crying out in pain every step, modA for walker management around obstacles   Stairs             Wheelchair Mobility    Modified Rankin (Stroke Patients Only)  Balance Overall balance assessment: Needs assistance Sitting-balance support: Feet supported, Single extremity supported Sitting balance-Leahy Scale: Fair Sitting balance - Comments: needs at least 1 UE supported to offload back   Standing balance support: Bilateral upper extremity supported, During functional activity, Reliant on assistive  device for balance Standing balance-Leahy Scale: Poor Standing balance comment: dependent on bilat UEs on RW                            Cognition Arousal/Alertness: Awake/alert Behavior During Therapy: Anxious Overall Cognitive Status: Within Functional Limits for tasks assessed                                 General Comments: anxious regarding "d/cing home tomorrow" but constantly assessing his inability and questioning on he is going to take care of himself safely        Exercises      General Comments General comments (skin integrity, edema, etc.): VSS on RA      Pertinent Vitals/Pain Pain Assessment Pain Assessment: Faces Faces Pain Scale: Hurts whole lot Pain Location: back with movement Pain Descriptors / Indicators: Discomfort, Grimacing, Guarding, Moaning, Spasm Pain Intervention(s): Monitored during session    Home Living                          Prior Function            PT Goals (current goals can now be found in the care plan section) Acute Rehab PT Goals Patient Stated Goal: return home PT Goal Formulation: With patient Time For Goal Achievement: 07/02/22 Potential to Achieve Goals: Good Progress towards PT goals: Progressing toward goals    Frequency    Min 3X/week      PT Plan Discharge plan needs to be updated    Co-evaluation              AM-PAC PT "6 Clicks" Mobility   Outcome Measure  Help needed turning from your back to your side while in a flat bed without using bedrails?: A Lot Help needed moving from lying on your back to sitting on the side of a flat bed without using bedrails?: A Lot Help needed moving to and from a bed to a chair (including a wheelchair)?: A Lot Help needed standing up from a chair using your arms (e.g., wheelchair or bedside chair)?: A Lot Help needed to walk in hospital room?: A Lot Help needed climbing 3-5 steps with a railing? : A Lot 6 Click Score: 12    End  of Session Equipment Utilized During Treatment: Gait belt Activity Tolerance: Patient tolerated treatment well;Patient limited by pain Patient left: in chair;with call bell/phone within reach;with chair alarm set Nurse Communication: Mobility status PT Visit Diagnosis: Unsteadiness on feet (R26.81);Pain Pain - part of body:  (back)     Time: 1610-9604 PT Time Calculation (min) (ACUTE ONLY): 27 min  Charges:  $Gait Training: 8-22 mins $Therapeutic Activity: 8-22 mins                     Lewis Shock, PT, DPT Acute Rehabilitation Services Secure chat preferred Office #: [phone]    Iona Hansen 06/27/2022, 9:31 AM

## 2022-06-27 NOTE — NC FL2 (Signed)
[place] MEDICAID FL2 LEVEL OF CARE FORM     IDENTIFICATION  Patient Name: Willilam [name] Birthdate: 1940-06-22 Sex: male Admission Date (Current Location): 06/23/2022  [facility] and [place] Number:  Producer, television/film/video and Address:  [facility], 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   PAC (premature atrial contraction) 03/01/2021   PVC (premature ventricular contraction) 03/01/2021   Atrial bigeminy 02/28/2020   OA (osteoarthritis) of knee 08/07/2019   Primary osteoarthritis of right knee 08/07/2019   Coronary artery disease involving coronary bypass graft of native heart without angina pectoris 09/19/2018   Fatigue 08/01/2018   Mixed hyperlipidemia 06/06/2018   S/P left atrial appendage ligation 07/25/2017   S/P AVR 07/25/2017   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   Coronary artery disease without angina pectoris 07/12/2017   Mitral regurgitation 07/07/2017   (HFpEF) heart failure with preserved ejection fraction (HCC) 07/07/2017   Gout 02/12/2013   Essential hypertension 02/12/2013   GERD (gastroesophageal reflux disease) 02/12/2013    Orientation RESPIRATION BLADDER Height & Weight     Self, Situation, Time, Place  Normal Continent Weight: 172 lb 6.4 oz (78.2 kg) Height:  6' (182.9 cm)  BEHAVIORAL SYMPTOMS/MOOD NEUROLOGICAL BOWEL NUTRITION STATUS      Continent Diet (See dc summary)  AMBULATORY STATUS COMMUNICATION OF NEEDS Skin   Limited  Assist Verbally Normal                       Personal Care Assistance Level of Assistance  Bathing, Feeding, Dressing Bathing Assistance: Maximum assistance Feeding assistance: Limited assistance Dressing Assistance: Limited assistance     Functional Limitations Info  Hearing   Hearing Info: Impaired      SPECIAL CARE FACTORS FREQUENCY  PT (By licensed PT), OT (By licensed OT)     PT Frequency: 5x/week OT Frequency: 5x/week            Contractures Contractures Info: Not present    Additional Factors Info  Code Status, Allergies Code Status Info: Full Allergies Info: Sulfamethoxazole-trimethoprim           Current Medications (06/27/2022):  This is the current hospital active medication list Current Facility-Administered Medications  Medication Dose Route Frequency Provider Last Rate Last Admin   acetaminophen (TYLENOL) tablet 650 mg  650 mg Oral Q6H PRN Opyd, Lavone Neri, MD       Or   acetaminophen (TYLENOL) suppository 650 mg  650 mg Rectal Q6H PRN Opyd, Lavone Neri, MD       aspirin EC tablet 81 mg  81 mg Oral Daily Leroy Sea, MD   81 mg at 06/27/22 0814   dexamethasone (DECADRON) injection 4 mg  4 mg Intravenous Q24H Leroy Sea, MD   4 mg at 06/26/22 1417   diclofenac Sodium (VOLTAREN) 1 % topical gel 2 g  2 g Topical QID Leroy Sea, MD   2 g at  06/27/22 1039   docusate sodium (COLACE) capsule 200 mg  200 mg Oral BID Susa Raring K, MD   200 mg at 06/26/22 0821   enoxaparin (LOVENOX) injection 40 mg  40 mg Subcutaneous Q24H Opyd, Lavone Neri, MD   40 mg at 06/27/22 0814   lidocaine (LIDODERM) 5 % 1 patch  1 patch Transdermal Q24H Opyd, Lavone Neri, MD   1 patch at 06/26/22 2207   methocarbamol (ROBAXIN) tablet 500 mg  500 mg Oral Q6H PRN Glade Lloyd, MD       oxyCODONE (Oxy IR/ROXICODONE) immediate release tablet 5 mg  5 mg Oral Q4H PRN Opyd, Lavone Neri, MD   5 mg at 06/24/22 2041   senna-docusate (Senokot-S) tablet 1 tablet  1 tablet  Oral QHS PRN Opyd, Lavone Neri, MD       sodium chloride flush (NS) 0.9 % injection 3 mL  3 mL Intravenous Q12H Opyd, Lavone Neri, MD   3 mL at 06/26/22 1610   tamsulosin (FLOMAX) capsule 0.4 mg  0.4 mg Oral QPC breakfast Leroy Sea, MD   0.4 mg at 06/27/22 9604     Discharge Medications: Please see discharge summary for a list of discharge medications.  Relevant Imaging Results:  Relevant Lab Results:   Additional Information SSN: [id number] [place], [place]

## 2022-06-28 DIAGNOSIS — E871 Hypo-osmolality and hyponatremia: Secondary | ICD-10-CM | POA: Diagnosis not present

## 2022-06-28 DIAGNOSIS — I5040 Unspecified combined systolic (congestive) and diastolic (congestive) heart failure: Secondary | ICD-10-CM | POA: Diagnosis not present

## 2022-06-28 DIAGNOSIS — M545 Low back pain, unspecified: Secondary | ICD-10-CM | POA: Diagnosis not present

## 2022-06-28 DIAGNOSIS — G8929 Other chronic pain: Secondary | ICD-10-CM | POA: Diagnosis not present

## 2022-06-28 DIAGNOSIS — I10 Essential (primary) hypertension: Secondary | ICD-10-CM | POA: Diagnosis not present

## 2022-06-28 DIAGNOSIS — Z7401 Bed confinement status: Secondary | ICD-10-CM | POA: Diagnosis not present

## 2022-06-28 DIAGNOSIS — M5124 Other intervertebral disc displacement, thoracic region: Secondary | ICD-10-CM | POA: Diagnosis not present

## 2022-06-28 LAB — COMPREHENSIVE METABOLIC PANEL
ALT: 24 U/L (ref 0–44)
AST: 18 U/L (ref 15–41)
Albumin: 2.6 g/dL — ABNORMAL LOW (ref 3.5–5.0)
Alkaline Phosphatase: 47 U/L (ref 38–126)
Anion gap: 9 (ref 5–15)
BUN: 17 mg/dL (ref 8–23)
CO2: 27 mmol/L (ref 22–32)
Calcium: 8.8 mg/dL — ABNORMAL LOW (ref 8.9–10.3)
Chloride: 96 mmol/L — ABNORMAL LOW (ref 98–111)
Creatinine, Ser: 0.83 mg/dL (ref 0.61–1.24)
GFR, Estimated: >60 mL/min (ref >60)
Glucose, Bld: 110 mg/dL — ABNORMAL HIGH (ref 70–99)
Potassium: 4.4 mmol/L (ref 3.5–5.1)
Sodium: 132 mmol/L — ABNORMAL LOW (ref 135–145)
Total Bilirubin: 0.7 mg/dL (ref 0.3–1.2)
Total Protein: 5.6 g/dL — ABNORMAL LOW (ref 6.5–8.1)

## 2022-06-28 LAB — CBC WITH DIFFERENTIAL/PLATELET
Abs Immature Granulocytes: 0.06 10*3/uL (ref 0.00–0.07)
Basophils Absolute: 0 10*3/uL (ref 0.0–0.1)
Basophils Relative: 0 %
Eosinophils Absolute: 0 10*3/uL (ref 0.0–0.5)
Eosinophils Relative: 0 %
HCT: 32.9 % — ABNORMAL LOW (ref 39.0–52.0)
Hemoglobin: 11.1 g/dL — ABNORMAL LOW (ref 13.0–17.0)
Immature Granulocytes: 1 %
Lymphocytes Relative: 6 %
Lymphs Abs: 0.5 10*3/uL — ABNORMAL LOW (ref 0.7–4.0)
MCH: 31.2 pg (ref 26.0–34.0)
MCHC: 33.7 g/dL (ref 30.0–36.0)
MCV: 92.4 fL (ref 80.0–100.0)
Monocytes Absolute: 0.7 10*3/uL (ref 0.1–1.0)
Monocytes Relative: 9 %
Neutro Abs: 6.9 10*3/uL (ref 1.7–7.7)
Neutrophils Relative %: 84 %
Platelets: 258 10*3/uL (ref 150–400)
RBC: 3.56 MIL/uL — ABNORMAL LOW (ref 4.22–5.81)
RDW: 12 % (ref 11.5–15.5)
WBC: 8.2 10*3/uL (ref 4.0–10.5)
nRBC: 0 % (ref 0.0–0.2)

## 2022-06-28 LAB — MAGNESIUM: Magnesium: 1.9 mg/dL (ref 1.7–2.4)

## 2022-06-28 LAB — BRAIN NATRIURETIC PEPTIDE: B Natriuretic Peptide: 389.7 pg/mL — ABNORMAL HIGH (ref 0.0–100.0)

## 2022-06-28 MED ORDER — LACTULOSE 20 G PO PACK: 20.0000 g | PACK | Freq: Three times a day (TID) | ORAL | 0 refills | Status: DC | PRN

## 2022-06-28 MED ORDER — LIDOCAINE 5 % EX PTCH: 1.0000 | MEDICATED_PATCH | CUTANEOUS | 0 refills | Status: DC

## 2022-06-28 MED ORDER — CARVEDILOL 3.125 MG PO TABS: 3.1250 mg | ORAL_TABLET | Freq: Two times a day (BID) | ORAL | 11 refills | Status: DC

## 2022-06-28 MED ORDER — METHYLPREDNISOLONE 4 MG PO TBPK: ORAL_TABLET | ORAL | 0 refills | Status: DC

## 2022-06-28 MED ORDER — CARVEDILOL 3.125 MG PO TABS
3.1250 mg | ORAL_TABLET | Freq: Two times a day (BID) | ORAL | Status: DC
  Administered 2022-06-28: 3.125 mg via ORAL
  Filled 2022-06-28: qty 1

## 2022-06-28 MED ORDER — FUROSEMIDE 40 MG PO TABS
40.0000 mg | ORAL_TABLET | Freq: Once | ORAL | Status: AC
  Administered 2022-06-28: 40 mg via ORAL
  Filled 2022-06-28: qty 1

## 2022-06-28 MED ORDER — DICLOFENAC SODIUM 1 % EX GEL: 2.0000 g | Freq: Four times a day (QID) | CUTANEOUS | 0 refills | Status: DC

## 2022-06-28 NOTE — TOC Transition Note (Signed)
Transition of Care [facility]) - CM/SW Discharge Note   Patient Details  Name: [name] MRN: [id number] Date of Birth: 05/02/1940  [facility]) CM/SW Contact:  Mearl Latin, LCSW Phone Number: 06/28/2022, 12:30 PM   Clinical Narrative:    Patient will DC to: [facility] Anticipated DC date: Family notified: Transport by:   Per MD patient ready for DC to [facility]. RN to call report prior to discharge [phone] room 210). RN, patient, patient's family, and facility notified of DC. Discharge Summary and FL2 sent to facility. DC packet on chart. Ambulance transport requested for patient.   CSW will sign off for now as social work intervention is no longer needed. Please consult [place] again if new needs arise.     Final next level of care: Skilled Nursing Facility Barriers to Discharge: Barriers Resolved   Patient Goals and CMS Choice CMS Medicare.gov Compare Post Acute Care list provided to:: Patient Choice offered to / list presented to : Patient  Discharge Placement     Existing PASRR number confirmed : 06/28/22          Patient chooses bed at: [facility] [place] Patient to be transferred to facility by: [facility] Name of family member notified: Friends Patient and family notified of of transfer: 06/28/22  Discharge Plan and Services Additional resources added to the After Visit Summary for   In-house Referral: Clinical Social Work   Post Acute Care Choice: Skilled Nursing Facility                               Social Determinants of Health (SDOH) Interventions SDOH Screenings   Food Insecurity: No Food Insecurity (05/13/2022)  Housing: Low Risk  (05/13/2022)  Transportation Needs: No Transportation Needs (05/13/2022)  Utilities: Not At Risk (05/13/2022)  Alcohol Screen: Low Risk  (05/13/2022)  Depression (PHQ2-9): Low Risk  (06/03/2022)  Financial Resource Strain: Low Risk  (05/13/2022)  Physical Activity: Sufficiently Active (05/13/2022)  Social  Connections: Socially Integrated (05/13/2022)  Stress: No Stress Concern Present (05/13/2022)  Tobacco Use: Medium Risk (06/24/2022)     Readmission Risk Interventions     No data to display

## 2022-06-28 NOTE — Discharge Summary (Signed)
[name] ZOX:[id number] DOB: Aug 01, 1940 DOA: 06/23/2022  PCP: Caesar Bookman, NP  Admit date: 06/23/2022  Discharge date: 06/28/2022  Admitted From: Home   Disposition:  SNF   Recommendations for Outpatient Follow-up:   Follow up with PCP in 1-2 weeks  PCP Please obtain BMP/CBC, 2 view CXR in 1week,  (see Discharge instructions)   PCP Please follow up on the following pending results: Needs outpatient follow-up with neurosurgery, kindly review CT abdomen pelvis for liver hypodensity finding, please repeat CT abdomen pelvis in 1 month to reevaluate the liver hypodensity and renal cyst.   Home Health: None   Equipment/Devices: None  Consultations: Nephrology, neurosurgery Donnella Sham over the phone Discharge Condition: Stable    CODE STATUS: Full    Diet Recommendation: Heart Healthy strict 1.2 L fluid restriction per day.  Chief Complaint  Patient presents with   Back Pain   Constipation     Brief history of present illness from the day of admission and additional interim summary    82 y.o. male with medical history significant for CAD status post CABG in 2019, bioprosthetic aortic valve replacement, cardiomyopathy with recovered EF, and gout who presents to the emergency department for evaluation of low back pain and constipation.                                                                  Hospital Course   Severe hyponatremia in the setting of ongoing low back pain.  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  Outpatient follow-up with PCP/SNF MD for monitoring.   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  He has no focal neurological deficits, no signs suggestive of cauda equina, improved after low-dose steroids started on 06/25/2022, reviewed images with neurosurgery Dr. Clydell Hakim, continue supportive care and short course of steroids with outpatient neurosurgery follow-up.  No focal deficits.  Pain much improved.   Incidental finding of 4.5 cm liver hypodensity.  No tenderness on palpation.  Outpatient follow-up with PCP and gastroenterology.   Incidental renal cyst noted on MRI.  Outpatient dedicated CT renal protocol per PCP.   Constipation.  Resolved after bowel regimen, continue to monitor.  Hypertension.  Low-dose Coreg.   Discharge diagnosis     Principal Problem:   Hyponatremia Active Problems:   (HFpEF) heart failure with preserved ejection fraction (HCC)   Coronary artery disease involving coronary bypass graft of native heart without angina pectoris   Low back pain   Constipation   Renal cyst, right    Discharge instructions    Discharge  Instructions     Discharge instructions   Complete by: As directed    Follow with Primary MD Ngetich, Dinah C, NP in 7 days   Get CBC, CMP, Magnesium -  checked next visit with your primary MD or SNF MD   Activity: As tolerated with Full fall precautions use walker/cane & assistance as needed  Disposition SNF  Diet: Heart Healthy with strict 1.2 L fluid restriction per day.  Special Instructions: If you have smoked or chewed Tobacco  in the last 2 yrs please stop smoking, stop any regular Alcohol  and or any Recreational drug use.  On your next visit with your primary care physician please Get Medicines reviewed and adjusted.  Please request your Prim.MD to go over all Hospital Tests and Procedure/Radiological results at the follow up, please get all Hospital records sent to your Prim MD by signing hospital release before you go home.  If you  experience worsening of your admission symptoms, develop shortness of breath, life threatening emergency, suicidal or homicidal thoughts you must seek medical attention immediately by calling 911 or calling your MD immediately  if symptoms less severe.  You Must read complete instructions/literature along with all the possible adverse reactions/side effects for all the Medicines you take and that have been prescribed to you. Take any new Medicines after you have completely understood and accpet all the possible adverse reactions/side effects.   Increase activity slowly   Complete by: As directed        Discharge Medications   Allergies as of 06/28/2022       Reactions   Sulfamethoxazole-trimethoprim Rash        Medication List     STOP taking these medications    amLODipine 5 MG tablet Commonly known as: NORVASC       TAKE these medications    acetaminophen 500 MG tablet Commonly known as: TYLENOL Take 2 tablets (1,000 mg total) by mouth every 6 (six) hours as needed for mild pain or fever.   ascorbic acid 500 MG tablet Commonly known as: VITAMIN C Take 1 tablet (500 mg total) by mouth daily.   aspirin EC 81 MG tablet Take 81 mg by mouth daily. Swallow whole.   Baclofen 5 MG Tabs Take 1 tablet (5 mg total) by mouth 2 (two) times daily as needed.   carvedilol 3.125 MG tablet Commonly known as: Coreg Take 1 tablet (3.125 mg total) by mouth 2 (two) times daily.   diclofenac Sodium 1 % Gel Commonly known as: VOLTAREN Apply 2 g topically 4 (four) times daily. To low back   Fish Oil 1000 MG Caps Take 2 capsules by mouth daily.   lactulose 20 g packet Commonly known as: CEPHULAC Take 1 packet (20 g total) by mouth 3 (three) times daily as needed (constipation).   lidocaine 5 % Commonly known as: Lidoderm Place 1 patch onto the skin daily. Remove & Discard patch within 12 hours or as directed by MD   methylPREDNISolone 4 MG Tbpk tablet Commonly known as:  MEDROL DOSEPAK follow package directions   MULTIVITAMIN ADULT PO Take 1 tablet by mouth daily.   PREPARATION H RE Place rectally as needed.   psyllium 58.6 % powder Commonly known as: METAMUCIL Take 1 packet by mouth daily as needed (regularity (3-4 times a week)).   silodosin 8 MG Caps capsule Commonly known as: RAPAFLO Take 8 mg by mouth daily.   ZyrTEC Allergy 10 MG Caps Generic drug: Cetirizine HCl Take 10  mg by mouth daily as needed (as needed for allergies).         Contact information for follow-up providers     Ngetich, Dinah C, NP. Schedule an appointment as soon as possible for a visit in 1 week(s).   Specialty: Family Medicine Contact information: [address] [place] [address] [phone]         Barnett Abu, MD. Schedule an appointment as soon as possible for a visit in 1 week(s).   Specialty: Neurosurgery Contact information: [address] [place] [address] [phone]              Contact information for after-discharge care     Destination     [facility], [place] Preferred SNF .   Service: Skilled Nursing Contact information: [address] [place] [address] [phone]                     Major procedures and Radiology Reports - PLEASE review detailed and final reports thoroughly  -     DG Chest Port 1 View  Result Date: 06/26/2022 CLINICAL DATA:  Shortness of breath EXAM: PORTABLE CHEST 1 VIEW COMPARISON:  08/28/2017 FINDINGS: Prior median sternotomy and valve replacement. Heart is borderline in size. Mediastinal contours within normal limits. Aortic atherosclerosis. No confluent airspace opacities, effusions or edema. No acute bony abnormality. IMPRESSION: No active disease. Electronically Signed   By: Charlett Nose M.D.   On: 06/26/2022 08:25   MR THORACIC SPINE WO CONTRAST  Result Date: 06/24/2022 CLINICAL DATA:  82 year old male with mid back pain.  Evidence of mild lower thoracic spinal stenosis on lumbar MRI yesterday. EXAM: MRI THORACIC SPINE WITHOUT CONTRAST TECHNIQUE: Multiplanar, multisequence MR imaging of the thoracic spine was performed. No intravenous contrast was administered. COMPARISON:  CT Abdomen and Pelvis 0044 hours today. Lumbar MRI yesterday. FINDINGS: Limited cervical spine imaging: Lower cervical spine advanced disc and endplate degeneration with mild suspected spondylolisthesis (series 16, image 6). Evidence of C5-C6 spinal stenosis. Thoracic spine segmentation: Appears normal, and concordant with the lumbar spine numbering yesterday. Alignment: Mildly exaggerated upper thoracic kyphosis. Subtle anterolisthesis such as T2 on T3. Some straightening of the lower thoracic kyphosis. Mildly exaggerated kyphosis also at the thoracolumbar junction. No significant scoliosis. Vertebrae: Normal background bone marrow signal. Scattered chronic thoracic endplate degeneration and/or small Schmorl's nodes. No marrow edema or evidence of acute osseous abnormality. Visible posterior ribs appear intact. Cord: No thoracic spinal cord signal abnormality despite some levels of degenerative spinal cord mass effect which are detailed below. Conus medullaris appears spared at T12-L1. Paraspinal and other soft tissues: Dependent left lung opacity and small layering bilateral pleural effusions. Superimposed moderate gastric hiatal hernia. See CT Abdomen and Pelvis from earlier today. Negative other visible chest and upper abdominal viscera. Thoracic paraspinal soft tissues remain within normal limits. Disc levels: T1-T2: Minor disc bulging. Mild to moderate facet hypertrophy. No spinal stenosis. Moderate right T1 neural foraminal stenosis. T2-T3: Mild anterolisthesis with disc bulging. Mild to moderate facet hypertrophy greater on the right. No spinal stenosis. Moderate right T2 neural foraminal stenosis. T3-T4: Possible right side facet ankylosis at this level.  Moderate facet hypertrophy there and moderate to severe right T3 neural foraminal stenosis. But no disc degeneration or spinal stenosis. T4-T5: Mild facet and ligament flavum hypertrophy. No significant stenosis. T5-T6: Mostly anterior disc bulging and endplate spurring. No stenosis. T6-T7: Circumferential disc bulge with a broad-based posterior component (series 22, image  20) effacing the ventral CSF space. Borderline to mild spinal stenosis when combined with mild facet and ligament flavum hypertrophy. Mild if any spinal cord mass effect. No foraminal stenosis. T7-T8: More focal left paracentral disc or disc osteophyte complex on series 22, image 23. Mild posterior element hypertrophy. Borderline to mild spinal stenosis and mild left ventral cord mass effect. T8-T9: Small posterior disc bulge or protrusion on series 22, image 27. Mild posterior element hypertrophy. No significant stenosis. T9-T10: Circumferential disc bulging. Mild posterior element hypertrophy. Borderline to mild spinal stenosis. T10-T11: Disc bulging and small right paracentral disc or disc osteophyte component on series 22, image 32. Up to moderate posterior element hypertrophy. Mild spinal stenosis. Mild if any spinal cord mass effect. Up to mild left and mild to moderate right T10 neural foraminal stenosis. T11-T12: Circumferential disc bulge with larger right paracentral disc extrusion (series 19, image 10 and series 22, image 35) with mild posterior element hypertrophy. Mild spinal stenosis and ventral cord mass effect. No cord signal abnormality. No foraminal stenosis. T12-L1: Negative. IMPRESSION: 1. No acute osseous abnormality in the thoracic spine. 2. Widespread thoracic disc degeneration and posterior element hypertrophy. Small right paracentral disc extrusion at T11-T12, which perhaps is more acute than the remaining degenerative findings. Mild spinal stenosis and mild ventral spinal cord mass effect at that level. No cord signal  abnormality. 3. Other mild multifactorial thoracic spinal stenosis T6-T7, T7-T8, T9-T10 and T10-T11. Mild additional cord mass effect but no thoracic cord signal abnormality. 4. Up to moderate occasional degenerative neural foraminal stenosis: Right T1, right T2, and right T10 nerve levels. 5. Partially visible advanced lower cervical spine degeneration with evidence of C5-C6 spinal stenosis. 6. Small layering pleural effusions and dependent left lung opacity. See CT Abdomen and Pelvis from earlier today. Electronically Signed   By: Odessa Fleming M.D.   On: 06/24/2022 04:50   CT ABDOMEN PELVIS W CONTRAST  Result Date: 06/24/2022 CLINICAL DATA:  Bowel obstruction suspected With a history of gout, hypertension, CAD, CHF, hepatitis who presents to the ED for evaluation of low back pain. States this began abruptly 2 days ago. He was active, played a round of disc golf and went on 2 bike rides EXAM: CT ABDOMEN AND PELVIS WITH CONTRAST TECHNIQUE: Multidetector CT imaging of the abdomen and pelvis was performed using the standard protocol following bolus administration of intravenous contrast. RADIATION DOSE REDUCTION: This exam was performed according to the departmental dose-optimization program which includes automated exposure control, adjustment of the mA and/or kV according to patient size and/or use of iterative reconstruction technique. CONTRAST:  75mL OMNIPAQUE IOHEXOL 350 MG/ML SOLN COMPARISON:  None Available. FINDINGS: Lower chest: Moderate volume hiatal hernia. Coronary artery calcifications. Hepatobiliary: Indeterminate right posterior hepatic lobe 4.5 cm triangular hypodensity. Finding unchanged on delayed view. Hypodense lesion of the left hepatic lobe likely a simple hepatic cyst. Contracted gallbladder. No gallstones, gallbladder wall thickening, or pericholecystic fluid. No biliary dilatation. Pancreas: No focal lesion. Normal pancreatic contour. No surrounding inflammatory changes. No main pancreatic  ductal dilatation. Spleen: Normal in size without focal abnormality. Adrenals/Urinary Tract: No adrenal nodule bilaterally. Nonspecific bilateral perinephric fat stranding. Bilateral kidneys enhance symmetrically. No hydronephrosis. No hydroureter. No nephroureterolithiasis. Fluid density lesion of the right kidney likely represents a simple renal cyst. Simple renal cysts, in the absence of clinically indicated signs/symptoms, require no independent follow-up. The urinary bladder is unremarkable. On delayed imaging, there is no urothelial wall thickening and there are no filling defects in the opacified portions  of the bilateral collecting systems or ureters. Stomach/Bowel: Stomach is within normal limits. Increased stool burden within the ascending colon. Stool throughout the transverse colon. Fecalized material within the distal small bowel lumen suggestive of an incompetent ileocecal valve versus slow transition. No evidence of bowel wall thickening or dilatation. Appendix appears normal. Vascular/Lymphatic: No abdominal aorta or iliac aneurysm. Severe atherosclerotic plaque of the aorta and its branches. No abdominal, pelvic, or inguinal lymphadenopathy. Reproductive: Prostate is unremarkable. Other: No intraperitoneal free fluid. No intraperitoneal free gas. No organized fluid collection. Musculoskeletal: CP. No suspicious lytic or blastic osseous lesions. No acute displaced fracture. Multilevel degenerative changes of the spine. IMPRESSION: 1. Moderate hiatal hernia. 2. Increased stool burden within the ascending colon and transverse colon. Correlate for constipation. 3. Indeterminate right posterior hepatic lobe 4.5 cm triangular hypodensity. Correlate with point tenderness to palpation and recent trauma-could possibly represent a hepatic laceration with no active bleed. Comparison with prior cross-sectional imaging would be of value. Electronically Signed   By: Tish Frederickson M.D.   On: 06/24/2022 01:26    MR Lumbar Spine Wo Contrast  Result Date: 06/24/2022 CLINICAL DATA:  Initial evaluation for low back pain, cauda equina syndrome suspected. EXAM: MRI LUMBAR SPINE WITHOUT CONTRAST TECHNIQUE: Multiplanar, multisequence MR imaging of the lumbar spine was performed. No intravenous contrast was administered. COMPARISON:  Radiograph from 06/23/2022 and MRI from 12/27/2011. FINDINGS: Segmentation: Standard. Lowest well-formed disc space labeled the L5-S1 level. Alignment: Trace grade 1 degenerative stepwise retrolisthesis of L1 on L2 through L3 on L4. Additional trace 3 mm retrolisthesis of L5 on S1. Vertebrae: Vertebral body height maintained without acute or chronic fracture. Bone marrow signal intensity within normal limits. No worrisome osseous lesions. Scattered reactive endplate changes with marrow edema present within the lumbar spine, most pronounced at L5-S1. Conus medullaris and cauda equina: Conus extends to the T12-L1 level. There is question of T2/STIR signal abnormality involving the distal cord at the level of T11-12 (series 6, image 9). This is not well seen on corresponding T2 weighted sequence, and could potentially be artifactual. Nerve roots of the cauda equina within normal limits. Paraspinal and other soft tissues: Paraspinous soft tissues within normal limits. 9 mm mildly complex cyst with intrinsic T1 hyperintensity partially visualize within the interpolar right kidney, indeterminate. Disc levels: T11-12: Seen only on sagittal projection. Disc desiccation with diffuse disc bulge. Small central disc protrusion with slight superior migration. Mild cord flattening without cord signal changes. No significant spinal stenosis. Foramina remain patent. T12-L1: Disc desiccation without significant disc bulge. No stenosis. L1-2: Degenerative intervertebral disc space narrowing with retrolisthesis. Diffuse disc bulge with reactive endplate spurring. Mild facet hypertrophy. Resultant mild bilateral  subarticular stenosis. Central canal remains patent. Mild right L1 foraminal narrowing. Left neural foramen remains patent. L2-3: Degenerate intervertebral disc space narrowing with retrolisthesis. Diffuse disc bulge with reactive endplate spurring. Mild facet and ligament flavum hypertrophy. Resultant mild bilateral subarticular stenosis. Central canal remains patent. Mild to moderate bilateral L2 foraminal narrowing. L3-4: Degenerate intervertebral disc space narrowing with diffuse disc bulge and disc desiccation. Reactive endplate spurring. Superimposed left foraminal to extraforaminal disc protrusion contacts the exiting left L3 nerve root. Mild bilateral facet hypertrophy. Resultant mild bilateral subarticular stenosis. Central canal remains patent. Mild to moderate bilateral L3 foraminal narrowing. L4-5: Degenerate intervertebral disc space narrowing with diffuse disc bulge. Reactive endplate spurring. Superimposed left foraminal disc protrusion contacts the exiting left L4 nerve root. Mild-to-moderate bilateral facet hypertrophy. Resultant mild-to-moderate canal with bilateral subarticular stenosis. Mild to  moderate bilateral L4 foraminal narrowing. L5-S1: Degenerate intervertebral disc space narrowing with diffuse disc bulge and reactive endplate spurring. Moderate bilateral facet hypertrophy. Resultant moderate narrowing of the lateral recesses bilaterally. Central canal remains patent. Moderate bilateral L5 foraminal stenosis. IMPRESSION: 1. Question T2/STIR signal abnormality involving the distal cord at the level of T11-12. While this finding may be artifactual in nature, possible cord signal changes/edema is difficult to exclude. Further evaluation with dedicated MRI of the thoracic spine recommended for further evaluation. 2. Multilevel lumbar spondylosis with resultant mild to moderate diffuse lateral recess and spinal stenosis at L1-2 through L4-5 as above. No high-grade spinal stenosis within the  lumbar spine itself. 3. Superimposed left foraminal to extraforaminal disc protrusions at L3-4 and L4-5, potentially affecting the exiting left L3 and L4 nerve roots respectively. 4. Mild to moderate bilateral L3 through L5 foraminal stenosis related to disc bulge, reactive endplate changes, and facet hypertrophy. 5. 9 mm mildly complex cyst with intrinsic T1 hyperintensity within the interpolar right kidney, indeterminate. Further evaluation with dedicated renal mass protocol CT and/or MRI recommended. This could be performed on a nonemergent outpatient basis. Electronically Signed   By: Rise Mu M.D.   On: 06/24/2022 01:24   DG Lumbar Spine Complete  Result Date: 06/23/2022 CLINICAL DATA:  Pain EXAM: LUMBAR SPINE - COMPLETE 4+ VIEW COMPARISON:  Lumbar spine x-ray 06/21/2022 FINDINGS: There is no evidence of lumbar spine fracture. Alignment is normal. There is moderate disc space narrowing at all levels of the lumbar spine with endplate osteophyte formation compatible with degenerative change. There is a very large amount of stool throughout the colon. There are atherosclerotic calcifications of the aorta. IMPRESSION: 1. No acute fracture or dislocation. 2. Moderate degenerative changes throughout the lumbar spine. 3. Very large amount of stool throughout the colon. Electronically Signed   By: Darliss Cheney M.D.   On: 06/23/2022 20:39   DG Lumbar Spine 2-3 Views  Result Date: 06/21/2022 CLINICAL DATA:  Low back pain EXAM: LUMBAR SPINE - 3 VIEW COMPARISON:  12/27/2011 MRI FINDINGS: Five lumbar type vertebral bodies are well visualized. Vertebral body height is well maintained. Multilevel osteophytic change and facet hypertrophic changes seen. Aortic calcifications noted. A single oblique film shows no pars defects. The opposite oblique was not able to be performed due to patient's inability to tolerate the exam. IMPRESSION: Somewhat limited lumbar spine as described. Multilevel degenerative  changes are seen without acute abnormality. Electronically Signed   By: Alcide Clever M.D.   On: 06/21/2022 17:31    Micro Results    No results found for this or any previous visit (from the past 240 hour(s)).  Today   Subjective    [name] today has no headache,no chest abdominal pain,no new weakness tingling or numbness, feels much better wants to go home today.    Objective   Blood pressure (!) 148/91, pulse 79, temperature 98.9 F (37.2 C), temperature source Oral, resp. rate 16, height 6' (1.829 m), weight 78 kg, SpO2 96 %.   Intake/Output Summary (Last 24 hours) at 06/28/2022 0806 Last data filed at 06/28/2022 0755 Gross per 24 hour  Intake 280 ml  Output 1125 ml  Net -845 ml    Exam  Awake Alert, No new F.N deficits,    [place].AT,PERRAL Supple Neck,   Symmetrical Chest wall movement, Good air movement bilaterally, CTAB RRR,No Gallops,   +ve B.Sounds, Abd Soft, Non tender,  No Cyanosis, Clubbing or edema    Data Review  Recent Labs  Lab 06/23/22 2324 06/24/22 8119 06/25/22 0358 06/25/22 0548 06/26/22 0328 06/27/22 0436 06/28/22 0529  WBC 10.1   < > 10.2 10.3 8.9 9.9 8.2  HGB 11.4*   < > 10.3* 10.3* 10.6* 10.7* 11.1*  HCT 32.8*   < > 28.5* 29.0* 30.3* 30.8* 32.9*  PLT 167   < > 173 183 199 223 258  MCV 92.7   < > 89.1 89.0 91.5 91.4 92.4  MCH 32.2   < > 32.2 31.6 32.0 31.8 31.2  MCHC 34.8   < > 36.1* 35.5 35.0 34.7 33.7  RDW 11.8   < > 11.7 11.9 11.8 11.9 12.0  LYMPHSABS 0.4*  --   --   --  0.3* 0.3* 0.5*  MONOABS 0.9  --   --   --  0.4 0.6 0.7  EOSABS 0.0  --   --   --  0.0 0.0 0.0  BASOSABS 0.0  --   --   --  0.0 0.0 0.0   < > = values in this interval not displayed.    Recent Labs  Lab 06/23/22 2324 06/24/22 1478 06/25/22 0358 06/25/22 0548 06/26/22 0328 06/27/22 0436 06/27/22 1102 06/27/22 1708 06/28/22 0529  NA 117*   < > 121*  --  123* 122*  --  127* 132*  K 4.0   < > 4.4  --  4.1 4.5  --  4.5 4.4  CL 83*   < > 89*  --  91* 90*   --  94* 96*  CO2 21*   < > 23  --  22 25  --  22 27  ANIONGAP 13   < > 9  --  10 7  --  11 9  GLUCOSE 107*   < > 111*  --  124* 120*  --  131* 110*  BUN 12   < > 11  --  12 15  --  18 17  CREATININE 0.76   < > 0.77  --  0.70 0.73  --  0.89 0.83  AST 19  --   --   --   --   --   --   --  18  ALT 21  --   --   --   --   --   --   --  24  ALKPHOS 50  --   --   --   --   --   --   --  47  BILITOT 1.3*  --   --   --   --   --   --   --  0.7  ALBUMIN 3.0*  --   --   --   --   --   --   --  2.6*  TSH  --   --   --   --   --   --  2.416  --   --   BNP  --   --   --  878.6* 777.5* 514.4*  --   --  389.7*  MG  --   --  1.9 1.9 2.0 1.9  --   --  1.9  CALCIUM 8.6*   < > 8.2*  --  8.3* 8.3*  --  8.8* 8.8*   < > = values in this interval not displayed.    Total Time in preparing paper work, data evaluation and todays exam - 35 minutes  Signature  -  Susa Raring M.D on 06/28/2022 at 8:06 AM   -  To page go to [email or web address]

## 2022-06-28 NOTE — Discharge Instructions (Signed)
Follow with Primary MD Ngetich, Dinah C, NP in 7 days   Get CBC, CMP, Magnesium -  checked next visit with your primary MD or SNF MD   Activity: As tolerated with Full fall precautions use walker/cane & assistance as needed  Disposition SNF  Diet: Heart Healthy with strict 1.2 L fluid restriction per day.  Special Instructions: If you have smoked or chewed Tobacco  in the last 2 yrs please stop smoking, stop any regular Alcohol  and or any Recreational drug use.  On your next visit with your primary care physician please Get Medicines reviewed and adjusted.  Please request your Prim.MD to go over all Hospital Tests and Procedure/Radiological results at the follow up, please get all Hospital records sent to your Prim MD by signing hospital release before you go home.  If you experience worsening of your admission symptoms, develop shortness of breath, life threatening emergency, suicidal or homicidal thoughts you must seek medical attention immediately by calling 911 or calling your MD immediately  if symptoms less severe.  You Must read complete instructions/literature along with all the possible adverse reactions/side effects for all the Medicines you take and that have been prescribed to you. Take any new Medicines after you have completely understood and accpet all the possible adverse reactions/side effects.

## 2022-06-28 NOTE — Progress Notes (Signed)
[name] Progress Note   82 y.o. male with coronary artery disease s/p CABG, ischemic cardiomyopathy with recovered ejection fraction, HTN, BPH admitted 5/17 with severe asymptomatic hyponatremia and acute low back pain. Started having intense lower back pain, largely bedbound. He does take medication for BPH.  He does believe he takes hydrochlorothiazide at home, denies taking any SSRI.     Assessment/ Plan:   Hyponatremia - Euvolemic on exam, appears this was the case on admission. Na had improved mildly with fluids, 117>123>122 this AM. Initial urine studies with Osm >500, Na 23. Patient is on dexamethasone for back pain.  - Great response with tolvaptan, would continue with fluid restriction (should be able to go to 32-40 oz a day and if does well as outpt can liberalize with close f/u. Part of the hyponatremia likely from pain leading to vasopressin release. - Of note, patient did mention he is on thiazide at home, would make sure he avoids this once stable for discharge.  Signing off at this time; please reconsult as needed.  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 Sounds very functional at baseline, would likely do well with rehab. Continue with PT/OT.  Hypertension - Would avoid thiazides. BPH- Reports he takes silodosin 8mg  at home. Was told by urologist he could take this or tamsulosin 0.8mg  daily. I have added an additional dose of tamsulosin for today. He is to have a friend bring his home medication for tomorrow.   Subjective:   Feeling better and in better spirits this am; denies f/c/n/v/sob/nausea.   Objective:   BP (!) 148/91 (BP Location: Left Arm)   Pulse 79   Temp 98 F (36.7 C) (Oral)   Resp 16   Ht 6' (1.829 m)   Wt 78 kg   SpO2 96%   BMI 23.32 kg/m   Intake/Output Summary (Last 24 hours) at 06/28/2022 0715 Last data filed at 06/28/2022 0400 Gross per 24 hour  Intake 280 ml  Output 850 ml  Net -570 ml    Weight change: -0.2 kg  Physical Exam: General: Elderly appearing person resting in bed in no acute distress HEENT: NCAT Neck: Supple. No JVD. Heart: Regular rate, rhythm. No murmurs.  Lungs: Clear to auscultation bilaterally. Abdomen: SNDNT + BS Extremities:No lower extremity edema.  Skin: Warm, dry. Good skin turgor on chest. Neuro: Awake, alert, conversing appropriately. .   Imaging: DG Chest Port 1 View  Result Date: 06/26/2022 CLINICAL DATA:  Shortness of breath EXAM: PORTABLE CHEST 1 VIEW COMPARISON:  08/28/2017 FINDINGS: Prior median sternotomy and valve replacement. Heart is borderline in size. Mediastinal contours within normal limits. Aortic atherosclerosis. No confluent airspace opacities, effusions or edema. No acute bony abnormality. IMPRESSION: No active disease. Electronically Signed   By: Charlett Nose M.D.   On: 06/26/2022 08:25    Labs: BMET Recent Labs  Lab 06/24/22 1607 06/24/22 2243 06/25/22 0358 06/26/22 0328 06/27/22 0436 06/27/22 1708 06/28/22 0529  NA 120* 120* 121* 123* 122* 127* 132*  K 4.4 4.5 4.4 4.1 4.5 4.5 4.4  CL 87* 87* 89* 91* 90* 94* 96*  CO2 21* 24 23 22 25 22 27   GLUCOSE 147* 121* 111* 124* 120* 131* 110*  BUN 11 12 11 12 15 18 17   CREATININE 0.79 0.74 0.77 0.70 0.73 0.89 0.83  CALCIUM 8.4* 8.2* 8.2* 8.3* 8.3* 8.8* 8.8*   CBC Recent Labs  Lab 06/23/22 2324 06/24/22 0981 06/25/22 1914 06/26/22 0328 06/27/22 0436 06/28/22 7829  WBC 10.1   < > 10.3 8.9 9.9 8.2  NEUTROABS 8.8*  --   --  8.2* 9.0* 6.9  HGB 11.4*   < > 10.3* 10.6* 10.7* 11.1*  HCT 32.8*   < > 29.0* 30.3* 30.8* 32.9*  MCV 92.7   < > 89.0 91.5 91.4 92.4  PLT 167   < > 183 199 223 258   < > = values in this interval not displayed.    Medications:     aspirin EC  81 mg Oral Daily   dexamethasone (DECADRON) injection  4 mg Intravenous Q24H   diclofenac Sodium  2 g Topical QID   docusate sodium  200 mg Oral BID   enoxaparin (LOVENOX) injection  40 mg  Subcutaneous Q24H   lidocaine  1 patch Transdermal Q24H   silodosin  8 mg Oral Q breakfast   sodium chloride flush  3 mL Intravenous Q12H      Paulene Floor, MD 06/28/2022, 7:15 AM

## 2022-07-15 ENCOUNTER — Telehealth: Payer: Self-pay

## 2022-07-15 DIAGNOSIS — R2681 Unsteadiness on feet: Secondary | ICD-10-CM

## 2022-07-15 NOTE — Telephone Encounter (Signed)
Will to schedule appointment to order PT/OT since face to face visit is required.

## 2022-07-15 NOTE — Telephone Encounter (Signed)
Patient called stating that he is ar [employer] and he needs order for PT and OT faxed to [phone]. He stated that he needs this ASAP.  Message sent to Richarda Blade, NP

## 2022-07-18 DIAGNOSIS — M6259 Muscle wasting and atrophy, not elsewhere classified, multiple sites: Secondary | ICD-10-CM | POA: Diagnosis not present

## 2022-07-18 DIAGNOSIS — R2681 Unsteadiness on feet: Secondary | ICD-10-CM | POA: Diagnosis not present

## 2022-07-18 DIAGNOSIS — M5440 Lumbago with sciatica, unspecified side: Secondary | ICD-10-CM | POA: Diagnosis not present

## 2022-07-18 DIAGNOSIS — R2689 Other abnormalities of gait and mobility: Secondary | ICD-10-CM | POA: Diagnosis not present

## 2022-07-19 ENCOUNTER — Encounter: Payer: Self-pay | Admitting: Adult Health

## 2022-07-19 ENCOUNTER — Ambulatory Visit (INDEPENDENT_AMBULATORY_CARE_PROVIDER_SITE_OTHER): Payer: Medicare PPO | Admitting: Adult Health

## 2022-07-19 VITALS — BP 130/80 | HR 83 | Temp 98.2°F | Resp 18 | Ht 71.0 in | Wt 172.0 lb

## 2022-07-19 DIAGNOSIS — M6259 Muscle wasting and atrophy, not elsewhere classified, multiple sites: Secondary | ICD-10-CM | POA: Diagnosis not present

## 2022-07-19 DIAGNOSIS — I10 Essential (primary) hypertension: Secondary | ICD-10-CM

## 2022-07-19 DIAGNOSIS — R5381 Other malaise: Secondary | ICD-10-CM | POA: Diagnosis not present

## 2022-07-19 DIAGNOSIS — E871 Hypo-osmolality and hyponatremia: Secondary | ICD-10-CM

## 2022-07-19 DIAGNOSIS — R2689 Other abnormalities of gait and mobility: Secondary | ICD-10-CM | POA: Diagnosis not present

## 2022-07-19 DIAGNOSIS — M545 Low back pain, unspecified: Secondary | ICD-10-CM | POA: Diagnosis not present

## 2022-07-19 DIAGNOSIS — N400 Enlarged prostate without lower urinary tract symptoms: Secondary | ICD-10-CM | POA: Diagnosis not present

## 2022-07-19 DIAGNOSIS — R2681 Unsteadiness on feet: Secondary | ICD-10-CM | POA: Diagnosis not present

## 2022-07-19 DIAGNOSIS — R160 Hepatomegaly, not elsewhere classified: Secondary | ICD-10-CM

## 2022-07-19 DIAGNOSIS — M6281 Muscle weakness (generalized): Secondary | ICD-10-CM | POA: Diagnosis not present

## 2022-07-19 DIAGNOSIS — M5440 Lumbago with sciatica, unspecified side: Secondary | ICD-10-CM | POA: Diagnosis not present

## 2022-07-19 LAB — CBC WITH DIFFERENTIAL/PLATELET
Hemoglobin: 9.3 g/dL — ABNORMAL LOW (ref 13.2–17.1)
Lymphs Abs: 477 cells/uL — ABNORMAL LOW (ref 850–3900)
MCHC: 33 g/dL (ref 32.0–36.0)
MPV: 9.8 fL (ref 7.5–12.5)
Monocytes Relative: 8 %
Neutro Abs: 6530 cells/uL (ref 1500–7800)
RDW: 12.1 % (ref 11.0–15.0)
WBC: 7.7 10*3/uL (ref 3.8–10.8)

## 2022-07-19 NOTE — Telephone Encounter (Signed)
Patient is scheduled for appointment 6/13.

## 2022-07-19 NOTE — Progress Notes (Addendum)
[facility] clinic  Provider:  Kenard Gower DNP  Code Status:  Full Code  Goals of Care:     07/19/2022    3:40 PM  Advanced Directives  Does Patient Have a Medical Advance Directive? Yes  Type of Estate agent of [place];Living will  Does patient want to make changes to medical advance directive? No - Patient declined  Copy of Healthcare Power of Attorney in Chart? Yes - validated most recent copy scanned in chart (See row information)     Chief Complaint  Patient presents with   Acute Visit    HOSPITAL FU - Foggy feeling    HPI: Patient is a 82 y.o. male seen today for hospital follow up. He was hospitalized on 06/23/22 to 06/28/22. He presented to the hospital for evaluation of low back pain. He was found to have sodium 117. 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 He was started on low-dose steroids on 06/25/22. Neurosurgery reviewed images and recommended supportive care and short course of steroids with outpatient neurosurgery follow up. He had an incidental finding of 4.5 cm liver hypodensity and renal cyst.  A friend brought him in today since he was not confident about his memory today  Noted to have a pause when talking, takes time to come up with words but able to give good details of what he wants to convey.  Having homehealth PT.   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    HFrEF (heart failure with reduced ejection fraction) (HCC)    Hypertension    Moderate mitral regurgitation    Severe aortic stenosis     Past Surgical History:  Procedure Laterality Date   AORTIC VALVE REPLACEMENT N/A 07/24/2017    Procedure: AORTIC VALVE REPLACEMENT (AVR) using 23mm Inspiris Aortic Valve;  Surgeon: Alleen Borne, MD;  Location: [facility] OR;  Service: Open Heart Surgery;  Laterality: N/A;   CLIPPING OF ATRIAL APPENDAGE N/A 07/24/2017   Procedure: CLIPPING OF ATRIAL APPENDAGE using a 50mm Atricure clip;  Surgeon: Alleen Borne, MD;  Location: [facility] OR;  Service: Open Heart Surgery;  Laterality: N/A;   CORONARY ARTERY BYPASS GRAFT N/A 07/24/2017   Procedure: CORONARY ARTERY BYPASS GRAFTING (CABG) times 3 using the left greater saphenous vein harvested endoscopically and left internal mammary artery.;  Surgeon: Alleen Borne, MD;  Location: [facility] OR;  Service: Open Heart Surgery;  Laterality: N/A;   EYE SURGERY Bilateral    lens replacements for cataracts   HERNIA REPAIR     INGUINAL HERNIA REPAIR Right 05/06/2015   Procedure: LAPAROSCOPIC RIGHT INGUINAL HERNIA WITH MESH;  Surgeon: Abigail Miyamoto, MD;  Location: [facility] ORS;  Service: General;  Laterality: Right;   INSERTION OF MESH Right 05/06/2015   Procedure: INSERTION OF MESH;  Surgeon: Abigail Miyamoto, MD;  Location: [facility] ORS;  Service: General;  Laterality: Right;   RIGHT/LEFT HEART CATH AND CORONARY ANGIOGRAPHY N/A 07/10/2017   Procedure: RIGHT/LEFT HEART CATH AND CORONARY ANGIOGRAPHY;  Surgeon: Elder Negus, MD;  Location: [facility] INVASIVE CV LAB;  Service: Cardiovascular;  Laterality: N/A;  TEE WITHOUT CARDIOVERSION N/A 07/11/2017   Procedure: TRANSESOPHAGEAL ECHOCARDIOGRAM (TEE);  Surgeon: Elder Negus, MD;  [facility] ENDOSCOPY;  Service: Cardiovascular;  Laterality: N/A;   TEE WITHOUT CARDIOVERSION N/A 07/24/2017   Procedure: TRANSESOPHAGEAL ECHOCARDIOGRAM (TEE);  Surgeon: Alleen Borne, MD;  Location: [facility] OR;  Service: Open Heart Surgery;  Laterality: N/A;   TOTAL KNEE ARTHROPLASTY Right 08/07/2019   Procedure: TOTAL KNEE ARTHROPLASTY;  Surgeon: Ollen Gross, MD;  Location: [facility] ORS;  Service: Orthopedics;  Laterality: Right;   ULTRASOUND GUIDANCE FOR  VASCULAR ACCESS  07/10/2017   Procedure: Ultrasound Guidance For Vascular Access;  Surgeon: Elder Negus, MD;  Location: [facility] INVASIVE CV LAB;  Service: Cardiovascular;;    Allergies  Allergen Reactions   Sulfamethoxazole-Trimethoprim Rash    Outpatient Encounter Medications as of 07/19/2022  Medication Sig   acetaminophen (TYLENOL) 500 MG tablet Take 2 tablets (1,000 mg total) by mouth every 6 (six) hours as needed for mild pain or fever.   ascorbic acid (VITAMIN C) 500 MG tablet Take 1 tablet (500 mg total) by mouth daily.   aspirin EC 81 MG tablet Take 81 mg by mouth daily. Swallow whole.   carvedilol (COREG) 3.125 MG tablet Take 1 tablet (3.125 mg total) by mouth 2 (two) times daily.   Cetirizine HCl (ZYRTEC ALLERGY) 10 MG CAPS Take 10 mg by mouth daily as needed (as needed for allergies).   diclofenac Sodium (VOLTAREN) 1 % GEL Apply 2 g topically 4 (four) times daily. To low back   lactulose (CEPHULAC) 20 g packet Take 1 packet (20 g total) by mouth 3 (three) times daily as needed (constipation).   lidocaine (LIDODERM) 5 % Place 1 patch onto the skin daily. Remove & Discard patch within 12 hours or as directed by MD   Multiple Vitamin (MULTIVITAMIN ADULT PO) Take 1 tablet by mouth daily.   Omega-3 Fatty Acids (FISH OIL) 1000 MG CAPS Take 2 capsules by mouth daily.   silodosin (RAPAFLO) 8 MG CAPS capsule Take 8 mg by mouth daily.   [DISCONTINUED] Baclofen 5 MG TABS Take 1 tablet (5 mg total) by mouth 2 (two) times daily as needed.   [DISCONTINUED] methylPREDNISolone (MEDROL DOSEPAK) 4 MG TBPK tablet follow package directions   [DISCONTINUED] Phenylephrine HCl (PREPARATION H RE) Place rectally as needed.   [DISCONTINUED] psyllium (METAMUCIL) 58.6 % powder Take 1 packet by mouth daily as needed (regularity (3-4 times a week)).    No facility-administered encounter medications on file as of 07/19/2022.    Review of Systems:  Review of Systems  Constitutional:  Negative for activity  change, appetite change and fever.  HENT:  Negative for sore throat.   Eyes: Negative.   Cardiovascular:  Negative for chest pain and leg swelling.  Gastrointestinal:  Negative for abdominal distention, diarrhea and vomiting.  Genitourinary:  Negative for dysuria, frequency and urgency.  Skin:  Negative for color change.  Neurological:  Negative for dizziness and headaches.  Psychiatric/Behavioral:  Negative for behavioral problems and sleep disturbance. The patient is not nervous/anxious.     Health Maintenance  Topic Date Due   COVID-19 Vaccine (5 - 2023-24 season) 10/08/2021   INFLUENZA VACCINE  09/08/2022   Medicare Annual Wellness (AWV)  06/03/2023   DTaP/Tdap/Td (3 - Td or Tdap) 01/17/2029   Pneumonia Vaccine 45+ Years old  Completed   Zoster Vaccines- Shingrix  Completed   HPV VACCINES  Aged Out    Physical Exam: Vitals:   07/19/22 1547  BP: 130/80  Pulse: 83  Resp: 18  Temp: 98.2 F (36.8 C)  SpO2: 95%  Height: 5\' 11"  (1.803 m)   Body mass index is 23.98 kg/m. Physical Exam Constitutional:      Appearance: Normal appearance.  HENT:     Head: Normocephalic and atraumatic.     Mouth/Throat:     Mouth: Mucous membranes are moist.  Eyes:     Conjunctiva/sclera: Conjunctivae normal.  Cardiovascular:     Rate and Rhythm: Normal rate and regular rhythm.     Pulses: Normal pulses.     Heart sounds: Normal heart sounds.  Pulmonary:     Effort: Pulmonary effort is normal.     Breath sounds: Normal breath sounds.  Abdominal:     General: Bowel sounds are normal.     Palpations: Abdomen is soft.  Musculoskeletal:        General: No swelling. Normal range of motion.     Cervical back: Normal range of motion.  Skin:    General: Skin is warm and dry.  Neurological:     General: No focal deficit present.     Mental Status: He is alert and oriented to person, place, and time.  Psychiatric:        Mood and Affect: Mood normal.        Behavior: Behavior normal.         Thought Content: Thought content normal.        Judgment: Judgment normal.     Labs reviewed: Basic Metabolic Panel: Recent Labs    09/20/21 1048 11/03/21 1141 03/23/22 1031 06/23/22 2324 06/26/22 0328 06/27/22 0436 06/27/22 1102 06/27/22 1708 06/28/22 0529  NA 138   < > 136   < > 123* 122*  --  127* 132*  K 5.3   < > 5.1   < > 4.1 4.5  --  4.5 4.4  CL 103   < > 101   < > 91* 90*  --  94* 96*  CO2 26   < > 26   < > 22 25  --  22 27  GLUCOSE 96   < > 90   < > 124* 120*  --  131* 110*  BUN 13   < > 11   < > 12 15  --  18 17  CREATININE 1.00   < > 0.99   < > 0.70 0.73  --  0.89 0.83  CALCIUM 9.7   < > 10.1   < > 8.3* 8.3*  --  8.8* 8.8*  MG  --   --   --    < > 2.0 1.9  --   --  1.9  TSH 1.63  --  1.39  --   --   --  2.416  --   --    < > = values in this interval not displayed.   Liver Function Tests: Recent Labs    03/23/22 1031 06/23/22 2324 06/28/22 0529  AST 23 19 18   ALT 19 21 24   ALKPHOS  --  50 47  BILITOT 0.6 1.3* 0.7  PROT 6.9 6.1* 5.6*  ALBUMIN  --  3.0* 2.6*   No results for input(s): "LIPASE", "AMYLASE" in the last 8760 hours. No results for input(s): "AMMONIA" in the last 8760 hours. CBC: Recent Labs    06/26/22 0328 06/27/22 0436 06/28/22 0529  WBC 8.9 9.9 8.2  NEUTROABS 8.2* 9.0* 6.9  HGB 10.6* 10.7* 11.1*  HCT 30.3* 30.8* 32.9*  MCV 91.5 91.4 92.4  PLT 199 223 258   Lipid Panel: Recent Labs    09/20/21 1048 03/23/22 1031  CHOL 162 173  HDL 61 73  LDLCALC 83 86  TRIG 88 62  CHOLHDL 2.7 2.4   Lab Results  Component Value Date   HGBA1C 5.7 (H) 07/20/2017    Procedures since last visit: DG Chest Port 1 View  Result Date: 06/26/2022 CLINICAL DATA:  Shortness of breath EXAM: PORTABLE CHEST 1 VIEW COMPARISON:  08/28/2017 FINDINGS: Prior median sternotomy and valve replacement. Heart is borderline in size. Mediastinal contours within normal limits. Aortic atherosclerosis. No confluent airspace opacities, effusions or edema. No  acute bony abnormality. IMPRESSION: No active disease. Electronically Signed   By: Charlett Nose M.D.   On: 06/26/2022 08:25   MR THORACIC SPINE WO CONTRAST  Result Date: 06/24/2022 CLINICAL DATA:  82 year old male with mid back pain. Evidence of mild lower thoracic spinal stenosis on lumbar MRI yesterday. EXAM: MRI THORACIC SPINE WITHOUT CONTRAST TECHNIQUE: Multiplanar, multisequence MR imaging of the thoracic spine was performed. No intravenous contrast was administered. COMPARISON:  CT Abdomen and Pelvis 0044 hours today. Lumbar MRI yesterday. FINDINGS: Limited cervical spine imaging: Lower cervical spine advanced disc and endplate degeneration with mild suspected spondylolisthesis (series 16, image 6). Evidence of C5-C6 spinal stenosis. Thoracic spine segmentation: Appears normal, and concordant with the lumbar spine numbering yesterday. Alignment: Mildly exaggerated upper thoracic kyphosis. Subtle anterolisthesis such as T2 on T3. Some straightening of the lower thoracic kyphosis. Mildly exaggerated kyphosis also at the thoracolumbar junction. No significant scoliosis. Vertebrae: Normal background bone marrow signal. Scattered chronic thoracic endplate degeneration and/or small Schmorl's nodes. No marrow edema or evidence of acute osseous abnormality. Visible posterior ribs appear intact. Cord: No thoracic spinal cord signal abnormality despite some levels of degenerative spinal cord mass effect which are detailed below. Conus medullaris appears spared at T12-L1. Paraspinal and other soft tissues: Dependent left lung opacity and small layering bilateral pleural effusions. Superimposed moderate gastric hiatal hernia. See CT Abdomen and Pelvis from earlier today. Negative other visible chest and upper abdominal viscera. Thoracic paraspinal soft tissues remain within normal limits. Disc levels: T1-T2: Minor disc bulging. Mild to moderate facet hypertrophy. No spinal stenosis. Moderate right T1 neural foraminal  stenosis. T2-T3: Mild anterolisthesis with disc bulging. Mild to moderate facet hypertrophy greater on the right. No spinal stenosis. Moderate right T2 neural foraminal stenosis. T3-T4: Possible right side facet ankylosis at this level. Moderate facet hypertrophy there and moderate to severe right T3 neural foraminal stenosis. But no disc degeneration or spinal stenosis. T4-T5: Mild facet and ligament flavum hypertrophy. No significant stenosis. T5-T6: Mostly anterior disc bulging and endplate spurring. No stenosis. T6-T7: Circumferential disc bulge with a broad-based posterior component (series 22, image 20) effacing the ventral CSF space. Borderline to mild spinal stenosis when combined with mild facet and ligament flavum hypertrophy. Mild if any spinal cord mass effect. No foraminal stenosis. T7-T8: More focal left paracentral disc or disc osteophyte complex on series 22, image 23. Mild posterior element hypertrophy. Borderline to mild spinal stenosis and mild left ventral cord mass effect. T8-T9: Small posterior disc bulge or protrusion on series 22, image 27. Mild posterior element hypertrophy. No significant stenosis. T9-T10: Circumferential disc bulging. Mild posterior element hypertrophy. Borderline to mild spinal stenosis. T10-T11: Disc bulging and small right paracentral disc or disc osteophyte component on series 22, image 32. Up to moderate posterior element hypertrophy. Mild spinal stenosis. Mild if any spinal cord mass  effect. Up to mild left and mild to moderate right T10 neural foraminal stenosis. T11-T12: Circumferential disc bulge with larger right paracentral disc extrusion (series 19, image 10 and series 22, image 35) with mild posterior element hypertrophy. Mild spinal stenosis and ventral cord mass effect. No cord signal abnormality. No foraminal stenosis. T12-L1: Negative. IMPRESSION: 1. No acute osseous abnormality in the thoracic spine. 2. Widespread thoracic disc degeneration and posterior  element hypertrophy. Small right paracentral disc extrusion at T11-T12, which perhaps is more acute than the remaining degenerative findings. Mild spinal stenosis and mild ventral spinal cord mass effect at that level. No cord signal abnormality. 3. Other mild multifactorial thoracic spinal stenosis T6-T7, T7-T8, T9-T10 and T10-T11. Mild additional cord mass effect but no thoracic cord signal abnormality. 4. Up to moderate occasional degenerative neural foraminal stenosis: Right T1, right T2, and right T10 nerve levels. 5. Partially visible advanced lower cervical spine degeneration with evidence of C5-C6 spinal stenosis. 6. Small layering pleural effusions and dependent left lung opacity. See CT Abdomen and Pelvis from earlier today. Electronically Signed   By: Odessa Fleming M.D.   On: 06/24/2022 04:50   CT ABDOMEN PELVIS W CONTRAST  Result Date: 06/24/2022 CLINICAL DATA:  Bowel obstruction suspected With a history of gout, hypertension, CAD, CHF, hepatitis who presents to the ED for evaluation of low back pain. States this began abruptly 2 days ago. He was active, played a round of disc golf and went on 2 bike rides EXAM: CT ABDOMEN AND PELVIS WITH CONTRAST TECHNIQUE: Multidetector CT imaging of the abdomen and pelvis was performed using the standard protocol following bolus administration of intravenous contrast. RADIATION DOSE REDUCTION: This exam was performed according to the departmental dose-optimization program which includes automated exposure control, adjustment of the mA and/or kV according to patient size and/or use of iterative reconstruction technique. CONTRAST:  75mL OMNIPAQUE IOHEXOL 350 MG/ML SOLN COMPARISON:  None Available. FINDINGS: Lower chest: Moderate volume hiatal hernia. Coronary artery calcifications. Hepatobiliary: Indeterminate right posterior hepatic lobe 4.5 cm triangular hypodensity. Finding unchanged on delayed view. Hypodense lesion of the left hepatic lobe likely a simple hepatic  cyst. Contracted gallbladder. No gallstones, gallbladder wall thickening, or pericholecystic fluid. No biliary dilatation. Pancreas: No focal lesion. Normal pancreatic contour. No surrounding inflammatory changes. No main pancreatic ductal dilatation. Spleen: Normal in size without focal abnormality. Adrenals/Urinary Tract: No adrenal nodule bilaterally. Nonspecific bilateral perinephric fat stranding. Bilateral kidneys enhance symmetrically. No hydronephrosis. No hydroureter. No nephroureterolithiasis. Fluid density lesion of the right kidney likely represents a simple renal cyst. Simple renal cysts, in the absence of clinically indicated signs/symptoms, require no independent follow-up. The urinary bladder is unremarkable. On delayed imaging, there is no urothelial wall thickening and there are no filling defects in the opacified portions of the bilateral collecting systems or ureters. Stomach/Bowel: Stomach is within normal limits. Increased stool burden within the ascending colon. Stool throughout the transverse colon. Fecalized material within the distal small bowel lumen suggestive of an incompetent ileocecal valve versus slow transition. No evidence of bowel wall thickening or dilatation. Appendix appears normal. Vascular/Lymphatic: No abdominal aorta or iliac aneurysm. Severe atherosclerotic plaque of the aorta and its branches. No abdominal, pelvic, or inguinal lymphadenopathy. Reproductive: Prostate is unremarkable. Other: No intraperitoneal free fluid. No intraperitoneal free gas. No organized fluid collection. Musculoskeletal: CP. No suspicious lytic or blastic osseous lesions. No acute displaced fracture. Multilevel degenerative changes of the spine. IMPRESSION: 1. Moderate hiatal hernia. 2. Increased stool burden within the ascending colon and  transverse colon. Correlate for constipation. 3. Indeterminate right posterior hepatic lobe 4.5 cm triangular hypodensity. Correlate with point tenderness to  palpation and recent trauma-could possibly represent a hepatic laceration with no active bleed. Comparison with prior cross-sectional imaging would be of value. Electronically Signed   By: Tish Frederickson M.D.   On: 06/24/2022 01:26   MR Lumbar Spine Wo Contrast  Result Date: 06/24/2022 CLINICAL DATA:  Initial evaluation for low back pain, cauda equina syndrome suspected. EXAM: MRI LUMBAR SPINE WITHOUT CONTRAST TECHNIQUE: Multiplanar, multisequence MR imaging of the lumbar spine was performed. No intravenous contrast was administered. COMPARISON:  Radiograph from 06/23/2022 and MRI from 12/27/2011. FINDINGS: Segmentation: Standard. Lowest well-formed disc space labeled the L5-S1 level. Alignment: Trace grade 1 degenerative stepwise retrolisthesis of L1 on L2 through L3 on L4. Additional trace 3 mm retrolisthesis of L5 on S1. Vertebrae: Vertebral body height maintained without acute or chronic fracture. Bone marrow signal intensity within normal limits. No worrisome osseous lesions. Scattered reactive endplate changes with marrow edema present within the lumbar spine, most pronounced at L5-S1. Conus medullaris and cauda equina: Conus extends to the T12-L1 level. There is question of T2/STIR signal abnormality involving the distal cord at the level of T11-12 (series 6, image 9). This is not well seen on corresponding T2 weighted sequence, and could potentially be artifactual. Nerve roots of the cauda equina within normal limits. Paraspinal and other soft tissues: Paraspinous soft tissues within normal limits. 9 mm mildly complex cyst with intrinsic T1 hyperintensity partially visualize within the interpolar right kidney, indeterminate. Disc levels: T11-12: Seen only on sagittal projection. Disc desiccation with diffuse disc bulge. Small central disc protrusion with slight superior migration. Mild cord flattening without cord signal changes. No significant spinal stenosis. Foramina remain patent. T12-L1: Disc  desiccation without significant disc bulge. No stenosis. L1-2: Degenerative intervertebral disc space narrowing with retrolisthesis. Diffuse disc bulge with reactive endplate spurring. Mild facet hypertrophy. Resultant mild bilateral subarticular stenosis. Central canal remains patent. Mild right L1 foraminal narrowing. Left neural foramen remains patent. L2-3: Degenerate intervertebral disc space narrowing with retrolisthesis. Diffuse disc bulge with reactive endplate spurring. Mild facet and ligament flavum hypertrophy. Resultant mild bilateral subarticular stenosis. Central canal remains patent. Mild to moderate bilateral L2 foraminal narrowing. L3-4: Degenerate intervertebral disc space narrowing with diffuse disc bulge and disc desiccation. Reactive endplate spurring. Superimposed left foraminal to extraforaminal disc protrusion contacts the exiting left L3 nerve root. Mild bilateral facet hypertrophy. Resultant mild bilateral subarticular stenosis. Central canal remains patent. Mild to moderate bilateral L3 foraminal narrowing. L4-5: Degenerate intervertebral disc space narrowing with diffuse disc bulge. Reactive endplate spurring. Superimposed left foraminal disc protrusion contacts the exiting left L4 nerve root. Mild-to-moderate bilateral facet hypertrophy. Resultant mild-to-moderate canal with bilateral subarticular stenosis. Mild to moderate bilateral L4 foraminal narrowing. L5-S1: Degenerate intervertebral disc space narrowing with diffuse disc bulge and reactive endplate spurring. Moderate bilateral facet hypertrophy. Resultant moderate narrowing of the lateral recesses bilaterally. Central canal remains patent. Moderate bilateral L5 foraminal stenosis. IMPRESSION: 1. Question T2/STIR signal abnormality involving the distal cord at the level of T11-12. While this finding may be artifactual in nature, possible cord signal changes/edema is difficult to exclude. Further evaluation with dedicated MRI of the  thoracic spine recommended for further evaluation. 2. Multilevel lumbar spondylosis with resultant mild to moderate diffuse lateral recess and spinal stenosis at L1-2 through L4-5 as above. No high-grade spinal stenosis within the lumbar spine itself. 3. Superimposed left foraminal to extraforaminal disc protrusions at L3-4  and L4-5, potentially affecting the exiting left L3 and L4 nerve roots respectively. 4. Mild to moderate bilateral L3 through L5 foraminal stenosis related to disc bulge, reactive endplate changes, and facet hypertrophy. 5. 9 mm mildly complex cyst with intrinsic T1 hyperintensity within the interpolar right kidney, indeterminate. Further evaluation with dedicated renal mass protocol CT and/or MRI recommended. This could be performed on a nonemergent outpatient basis. Electronically Signed   By: Rise Mu M.D.   On: 06/24/2022 01:24   DG Lumbar Spine Complete  Result Date: 06/23/2022 CLINICAL DATA:  Pain EXAM: LUMBAR SPINE - COMPLETE 4+ VIEW COMPARISON:  Lumbar spine x-ray 06/21/2022 FINDINGS: There is no evidence of lumbar spine fracture. Alignment is normal. There is moderate disc space narrowing at all levels of the lumbar spine with endplate osteophyte formation compatible with degenerative change. There is a very large amount of stool throughout the colon. There are atherosclerotic calcifications of the aorta. IMPRESSION: 1. No acute fracture or dislocation. 2. Moderate degenerative changes throughout the lumbar spine. 3. Very large amount of stool throughout the colon. Electronically Signed   By: Darliss Cheney M.D.   On: 06/23/2022 20:39   DG Lumbar Spine 2-3 Views  Result Date: 06/21/2022 CLINICAL DATA:  Low back pain EXAM: LUMBAR SPINE - 3 VIEW COMPARISON:  12/27/2011 MRI FINDINGS: Five lumbar type vertebral bodies are well visualized. Vertebral body height is well maintained. Multilevel osteophytic change and facet hypertrophic changes seen. Aortic calcifications  noted. A single oblique film shows no pars defects. The opposite oblique was not able to be performed due to patient's inability to tolerate the exam. IMPRESSION: Somewhat limited lumbar spine as described. Multilevel degenerative changes are seen without acute abnormality. Electronically Signed   By: Alcide Clever M.D.   On: 06/21/2022 17:31    Assessment/Plan  1. Hyponatremia -  thought to be due to SIADH -  sodium 132, 06/28/22 - CBC With Differential/Platelet - Complete Metabolic Panel with eGFR - Magnesium  2. 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 He was started on low-dose steroids on 06/25/22. Neurosurgery reviewed images and recommended supportive care and short course of steroids with outpatient neurosurgery follow up - Ambulatory referral to Neurosurgery DME:  semi-electric hospital bed with gel overlay due to inability to move in order to change body position. Patient requires re-positioning of the body in ways that cannot be achieved with an ordinary bed or wedge pillow to eliminate pain. 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 He lives alone in his home and lack care giver support.   3. Essential hypertension -  BP 130/80, stable -  continue Coreg  4. Physical deconditioning -  continue Home health PT for therapeutic strengthening exercises -  fall precautions -  DME:  wheelchair with elevated leg rests - Magnesium  5. Hypodense mass of liver -  denies pain - Ambulatory referral to Gastroenterology  6. Benign prostatic hyperplasia without lower urinary tract symptoms -  continue Rapaflo   Labs/tests ordered:   magnesium, CMP and CBC  Next appt:  07/21/2022

## 2022-07-20 LAB — COMPLETE METABOLIC PANEL WITH GFR
AG Ratio: 1 (calc) (ref 1.0–2.5)
ALT: 22 U/L (ref 9–46)
AST: 17 U/L (ref 10–35)
Albumin: 3 g/dL — ABNORMAL LOW (ref 3.6–5.1)
Alkaline phosphatase (APISO): 63 U/L (ref 35–144)
BUN: 24 mg/dL (ref 7–25)
CO2: 28 mmol/L (ref 20–32)
Calcium: 8.7 mg/dL (ref 8.6–10.3)
Chloride: 97 mmol/L — ABNORMAL LOW (ref 98–110)
Creat: 1.04 mg/dL (ref 0.70–1.22)
Globulin: 2.9 g/dL (calc) (ref 1.9–3.7)
Glucose, Bld: 114 mg/dL (ref 65–139)
Potassium: 5.1 mmol/L (ref 3.5–5.3)
Sodium: 133 mmol/L — ABNORMAL LOW (ref 135–146)
Total Bilirubin: 0.5 mg/dL (ref 0.2–1.2)
Total Protein: 5.9 g/dL — ABNORMAL LOW (ref 6.1–8.1)
eGFR: 73 mL/min/{1.73_m2} (ref >=60)

## 2022-07-20 LAB — CBC WITH DIFFERENTIAL/PLATELET
Absolute Monocytes: 616 cells/uL (ref 200–950)
Basophils Absolute: 31 cells/uL (ref 0–200)
Basophils Relative: 0.4 %
Eosinophils Absolute: 46 cells/uL (ref 15–500)
Eosinophils Relative: 0.6 %
HCT: 28.2 % — ABNORMAL LOW (ref 38.5–50.0)
MCH: 30.5 pg (ref 27.0–33.0)
MCV: 92.5 fL (ref 80.0–100.0)
Neutrophils Relative %: 84.8 %
Platelets: 214 10*3/uL (ref 140–400)
RBC: 3.05 10*6/uL — ABNORMAL LOW (ref 4.20–5.80)
Total Lymphocyte: 6.2 %

## 2022-07-20 LAB — MAGNESIUM: Magnesium: 2 mg/dL (ref 1.5–2.5)

## 2022-07-21 ENCOUNTER — Ambulatory Visit: Payer: Medicare PPO | Admitting: Family

## 2022-07-21 ENCOUNTER — Telehealth: Payer: Self-pay

## 2022-07-21 DIAGNOSIS — M6259 Muscle wasting and atrophy, not elsewhere classified, multiple sites: Secondary | ICD-10-CM | POA: Diagnosis not present

## 2022-07-21 DIAGNOSIS — R2689 Other abnormalities of gait and mobility: Secondary | ICD-10-CM | POA: Diagnosis not present

## 2022-07-21 DIAGNOSIS — R2681 Unsteadiness on feet: Secondary | ICD-10-CM | POA: Diagnosis not present

## 2022-07-21 DIAGNOSIS — M5440 Lumbago with sciatica, unspecified side: Secondary | ICD-10-CM | POA: Diagnosis not present

## 2022-07-21 NOTE — Telephone Encounter (Signed)
Patient states that he needs order for wheelchair. PT requested that order says order needs to say he needs elevated legs rests.  Message sent to Richarda Blade, NP

## 2022-07-21 NOTE — Addendum Note (Signed)
Addended by: Kenard Gower C on: 07/21/2022 09:46 PM   Modules accepted: Orders

## 2022-07-21 NOTE — Telephone Encounter (Signed)
Ordered wheelchair with elevated leg rests.

## 2022-07-21 NOTE — Telephone Encounter (Signed)
Patient was seen recently by Monina NP.please added to visit note order for wheelchair as requested.

## 2022-07-21 NOTE — Addendum Note (Signed)
Addended by: Kenard Gower C on: 07/21/2022 09:41 PM   Modules accepted: Orders

## 2022-07-21 NOTE — Progress Notes (Signed)
-     hgb 9.3, dropped from 11.1 (06/28/22), will need to be re-check on next visit for monitoring, when was your last colonoscopy? -   Magnesium level, normal -   sodium 133, almost normal (normal 135-145), up from 132 (06/28/22)

## 2022-07-22 ENCOUNTER — Telehealth: Payer: Self-pay

## 2022-07-22 DIAGNOSIS — M5459 Other low back pain: Secondary | ICD-10-CM | POA: Diagnosis not present

## 2022-07-22 DIAGNOSIS — M62512 Muscle wasting and atrophy, not elsewhere classified, left shoulder: Secondary | ICD-10-CM | POA: Diagnosis not present

## 2022-07-22 DIAGNOSIS — M62511 Muscle wasting and atrophy, not elsewhere classified, right shoulder: Secondary | ICD-10-CM | POA: Diagnosis not present

## 2022-07-22 DIAGNOSIS — R278 Other lack of coordination: Secondary | ICD-10-CM | POA: Diagnosis not present

## 2022-07-22 NOTE — Addendum Note (Signed)
Addended by: Kenard Gower C on: 07/22/2022 11:43 AM   Modules accepted: Orders

## 2022-07-22 NOTE — Telephone Encounter (Signed)
Ordered hospital bed and gel overlay mattress.

## 2022-07-22 NOTE — Telephone Encounter (Signed)
Order was faxed by Heath Gold, Administrative assistant

## 2022-07-22 NOTE — Telephone Encounter (Signed)
Patient called stating that he is having back spasms during recovery and his aide suggested he inquire about getting a hospital bed temporarily.   Message sent to Kenard Gower, NP

## 2022-07-25 ENCOUNTER — Telehealth: Payer: Self-pay | Admitting: Family

## 2022-07-25 ENCOUNTER — Encounter: Payer: Self-pay | Admitting: Adult Health

## 2022-07-25 DIAGNOSIS — M6259 Muscle wasting and atrophy, not elsewhere classified, multiple sites: Secondary | ICD-10-CM | POA: Diagnosis not present

## 2022-07-25 DIAGNOSIS — M5440 Lumbago with sciatica, unspecified side: Secondary | ICD-10-CM | POA: Diagnosis not present

## 2022-07-25 DIAGNOSIS — R2681 Unsteadiness on feet: Secondary | ICD-10-CM | POA: Diagnosis not present

## 2022-07-25 DIAGNOSIS — R2689 Other abnormalities of gait and mobility: Secondary | ICD-10-CM | POA: Diagnosis not present

## 2022-07-25 NOTE — Telephone Encounter (Signed)
[facility] called regarding request for wheelchair, and requested I send last office visit note. Faxed note from 07/19/22 to [phone].

## 2022-07-26 DIAGNOSIS — I5020 Unspecified systolic (congestive) heart failure: Secondary | ICD-10-CM | POA: Diagnosis not present

## 2022-07-26 DIAGNOSIS — R278 Other lack of coordination: Secondary | ICD-10-CM | POA: Diagnosis not present

## 2022-07-26 DIAGNOSIS — M5459 Other low back pain: Secondary | ICD-10-CM | POA: Diagnosis not present

## 2022-07-26 DIAGNOSIS — M62512 Muscle wasting and atrophy, not elsewhere classified, left shoulder: Secondary | ICD-10-CM | POA: Diagnosis not present

## 2022-07-26 DIAGNOSIS — M62511 Muscle wasting and atrophy, not elsewhere classified, right shoulder: Secondary | ICD-10-CM | POA: Diagnosis not present

## 2022-07-27 DIAGNOSIS — M62512 Muscle wasting and atrophy, not elsewhere classified, left shoulder: Secondary | ICD-10-CM | POA: Diagnosis not present

## 2022-07-27 DIAGNOSIS — M62511 Muscle wasting and atrophy, not elsewhere classified, right shoulder: Secondary | ICD-10-CM | POA: Diagnosis not present

## 2022-07-27 DIAGNOSIS — M5459 Other low back pain: Secondary | ICD-10-CM | POA: Diagnosis not present

## 2022-07-27 DIAGNOSIS — R278 Other lack of coordination: Secondary | ICD-10-CM | POA: Diagnosis not present

## 2022-07-27 DIAGNOSIS — M6259 Muscle wasting and atrophy, not elsewhere classified, multiple sites: Secondary | ICD-10-CM | POA: Diagnosis not present

## 2022-07-27 DIAGNOSIS — M5440 Lumbago with sciatica, unspecified side: Secondary | ICD-10-CM | POA: Diagnosis not present

## 2022-07-27 DIAGNOSIS — R2681 Unsteadiness on feet: Secondary | ICD-10-CM | POA: Diagnosis not present

## 2022-07-27 DIAGNOSIS — R2689 Other abnormalities of gait and mobility: Secondary | ICD-10-CM | POA: Diagnosis not present

## 2022-07-28 ENCOUNTER — Emergency Department (HOSPITAL_COMMUNITY): Payer: Medicare PPO

## 2022-07-28 ENCOUNTER — Encounter (HOSPITAL_COMMUNITY): Payer: Self-pay

## 2022-07-28 ENCOUNTER — Inpatient Hospital Stay (HOSPITAL_COMMUNITY): Payer: Medicare PPO

## 2022-07-28 ENCOUNTER — Inpatient Hospital Stay (HOSPITAL_COMMUNITY)
Admission: EM | Admit: 2022-07-28 | Discharge: 2022-08-03 | DRG: 871 | Disposition: A | Discharge status: Home Health | Payer: Medicare PPO | Source: Skilled Nursing Facility | Attending: Internal Medicine | Admitting: Internal Medicine

## 2022-07-28 ENCOUNTER — Other Ambulatory Visit: Payer: Self-pay

## 2022-07-28 DIAGNOSIS — R7881 Bacteremia: Secondary | ICD-10-CM | POA: Diagnosis not present

## 2022-07-28 DIAGNOSIS — Z79899 Other long term (current) drug therapy: Secondary | ICD-10-CM

## 2022-07-28 DIAGNOSIS — I2510 Atherosclerotic heart disease of native coronary artery without angina pectoris: Secondary | ICD-10-CM | POA: Diagnosis not present

## 2022-07-28 DIAGNOSIS — Z7982 Long term (current) use of aspirin: Secondary | ICD-10-CM

## 2022-07-28 DIAGNOSIS — T82897A Other specified complication of cardiac prosthetic devices, implants and grafts, initial encounter: Secondary | ICD-10-CM

## 2022-07-28 DIAGNOSIS — I358 Other nonrheumatic aortic valve disorders: Secondary | ICD-10-CM | POA: Insufficient documentation

## 2022-07-28 DIAGNOSIS — T82857A Stenosis of cardiac prosthetic devices, implants and grafts, initial encounter: Secondary | ICD-10-CM | POA: Diagnosis present

## 2022-07-28 DIAGNOSIS — J9811 Atelectasis: Secondary | ICD-10-CM | POA: Diagnosis not present

## 2022-07-28 DIAGNOSIS — A409 Streptococcal sepsis, unspecified: Secondary | ICD-10-CM | POA: Diagnosis not present

## 2022-07-28 DIAGNOSIS — Z96651 Presence of right artificial knee joint: Secondary | ICD-10-CM | POA: Diagnosis present

## 2022-07-28 DIAGNOSIS — I110 Hypertensive heart disease with heart failure: Secondary | ICD-10-CM | POA: Diagnosis present

## 2022-07-28 DIAGNOSIS — Z7984 Long term (current) use of oral hypoglycemic drugs: Secondary | ICD-10-CM | POA: Diagnosis not present

## 2022-07-28 DIAGNOSIS — Z1152 Encounter for screening for COVID-19: Secondary | ICD-10-CM | POA: Diagnosis not present

## 2022-07-28 DIAGNOSIS — J9601 Acute respiratory failure with hypoxia: Secondary | ICD-10-CM | POA: Diagnosis present

## 2022-07-28 DIAGNOSIS — I429 Cardiomyopathy, unspecified: Secondary | ICD-10-CM | POA: Diagnosis present

## 2022-07-28 DIAGNOSIS — Z823 Family history of stroke: Secondary | ICD-10-CM | POA: Diagnosis not present

## 2022-07-28 DIAGNOSIS — J90 Pleural effusion, not elsewhere classified: Secondary | ICD-10-CM | POA: Diagnosis not present

## 2022-07-28 DIAGNOSIS — I340 Nonrheumatic mitral (valve) insufficiency: Secondary | ICD-10-CM | POA: Diagnosis not present

## 2022-07-28 DIAGNOSIS — D649 Anemia, unspecified: Secondary | ICD-10-CM

## 2022-07-28 DIAGNOSIS — A408 Other streptococcal sepsis: Principal | ICD-10-CM | POA: Diagnosis present

## 2022-07-28 DIAGNOSIS — I509 Heart failure, unspecified: Secondary | ICD-10-CM | POA: Diagnosis not present

## 2022-07-28 DIAGNOSIS — R509 Fever, unspecified: Secondary | ICD-10-CM | POA: Diagnosis not present

## 2022-07-28 DIAGNOSIS — Z8249 Family history of ischemic heart disease and other diseases of the circulatory system: Secondary | ICD-10-CM

## 2022-07-28 DIAGNOSIS — I5043 Acute on chronic combined systolic (congestive) and diastolic (congestive) heart failure: Secondary | ICD-10-CM | POA: Diagnosis not present

## 2022-07-28 DIAGNOSIS — A419 Sepsis, unspecified organism: Secondary | ICD-10-CM | POA: Diagnosis not present

## 2022-07-28 DIAGNOSIS — Z87891 Personal history of nicotine dependence: Secondary | ICD-10-CM | POA: Diagnosis not present

## 2022-07-28 DIAGNOSIS — I5033 Acute on chronic diastolic (congestive) heart failure: Secondary | ICD-10-CM | POA: Insufficient documentation

## 2022-07-28 DIAGNOSIS — I38 Endocarditis, valve unspecified: Secondary | ICD-10-CM | POA: Diagnosis not present

## 2022-07-28 DIAGNOSIS — I350 Nonrheumatic aortic (valve) stenosis: Secondary | ICD-10-CM | POA: Diagnosis not present

## 2022-07-28 DIAGNOSIS — I10 Essential (primary) hypertension: Secondary | ICD-10-CM | POA: Diagnosis not present

## 2022-07-28 DIAGNOSIS — I493 Ventricular premature depolarization: Secondary | ICD-10-CM | POA: Diagnosis not present

## 2022-07-28 DIAGNOSIS — Y95 Nosocomial condition: Secondary | ICD-10-CM | POA: Diagnosis present

## 2022-07-28 DIAGNOSIS — Y831 Surgical operation with implant of artificial internal device as the cause of abnormal reaction of the patient, or of later complication, without mention of misadventure at the time of the procedure: Secondary | ICD-10-CM | POA: Diagnosis present

## 2022-07-28 DIAGNOSIS — R6520 Severe sepsis without septic shock: Secondary | ICD-10-CM | POA: Diagnosis present

## 2022-07-28 DIAGNOSIS — T826XXA Infection and inflammatory reaction due to cardiac valve prosthesis, initial encounter: Secondary | ICD-10-CM | POA: Diagnosis present

## 2022-07-28 DIAGNOSIS — K219 Gastro-esophageal reflux disease without esophagitis: Secondary | ICD-10-CM | POA: Diagnosis present

## 2022-07-28 DIAGNOSIS — M109 Gout, unspecified: Secondary | ICD-10-CM | POA: Diagnosis present

## 2022-07-28 DIAGNOSIS — E871 Hypo-osmolality and hyponatremia: Secondary | ICD-10-CM | POA: Diagnosis not present

## 2022-07-28 DIAGNOSIS — Z951 Presence of aortocoronary bypass graft: Secondary | ICD-10-CM

## 2022-07-28 DIAGNOSIS — I5021 Acute systolic (congestive) heart failure: Secondary | ICD-10-CM | POA: Insufficient documentation

## 2022-07-28 DIAGNOSIS — I5022 Chronic systolic (congestive) heart failure: Secondary | ICD-10-CM

## 2022-07-28 DIAGNOSIS — I700 Atherosclerosis of aorta: Secondary | ICD-10-CM | POA: Diagnosis present

## 2022-07-28 DIAGNOSIS — E782 Mixed hyperlipidemia: Secondary | ICD-10-CM | POA: Diagnosis not present

## 2022-07-28 DIAGNOSIS — N281 Cyst of kidney, acquired: Secondary | ICD-10-CM | POA: Diagnosis not present

## 2022-07-28 DIAGNOSIS — R0902 Hypoxemia: Secondary | ICD-10-CM | POA: Diagnosis not present

## 2022-07-28 DIAGNOSIS — I330 Acute and subacute infective endocarditis: Secondary | ICD-10-CM | POA: Diagnosis not present

## 2022-07-28 DIAGNOSIS — J189 Pneumonia, unspecified organism: Secondary | ICD-10-CM | POA: Diagnosis not present

## 2022-07-28 DIAGNOSIS — R0689 Other abnormalities of breathing: Secondary | ICD-10-CM | POA: Diagnosis not present

## 2022-07-28 DIAGNOSIS — Z881 Allergy status to other antibiotic agents status: Secondary | ICD-10-CM | POA: Diagnosis not present

## 2022-07-28 DIAGNOSIS — Z452 Encounter for adjustment and management of vascular access device: Secondary | ICD-10-CM

## 2022-07-28 DIAGNOSIS — D6959 Other secondary thrombocytopenia: Secondary | ICD-10-CM | POA: Diagnosis present

## 2022-07-28 DIAGNOSIS — D6489 Other specified anemias: Secondary | ICD-10-CM | POA: Diagnosis present

## 2022-07-28 DIAGNOSIS — R000 Tachycardia, unspecified: Secondary | ICD-10-CM | POA: Diagnosis not present

## 2022-07-28 DIAGNOSIS — E7800 Pure hypercholesterolemia, unspecified: Secondary | ICD-10-CM

## 2022-07-28 DIAGNOSIS — Z952 Presence of prosthetic heart valve: Secondary | ICD-10-CM

## 2022-07-28 DIAGNOSIS — B955 Unspecified streptococcus as the cause of diseases classified elsewhere: Secondary | ICD-10-CM | POA: Insufficient documentation

## 2022-07-28 DIAGNOSIS — R339 Retention of urine, unspecified: Secondary | ICD-10-CM | POA: Diagnosis present

## 2022-07-28 DIAGNOSIS — M6281 Muscle weakness (generalized): Secondary | ICD-10-CM | POA: Diagnosis not present

## 2022-07-28 LAB — LACTIC ACID, PLASMA
Lactic Acid, Venous: 1.3 mmol/L (ref 0.5–1.9)
Lactic Acid, Venous: 0.9 mmol/L (ref 0.5–1.9)

## 2022-07-28 LAB — CBC WITH DIFFERENTIAL/PLATELET
Abs Immature Granulocytes: 0.05 10*3/uL (ref 0.00–0.07)
Basophils Absolute: 0 10*3/uL (ref 0.0–0.1)
Basophils Relative: 0 %
Eosinophils Absolute: 0 10*3/uL (ref 0.0–0.5)
Eosinophils Relative: 0 %
HCT: 27.6 % — ABNORMAL LOW (ref 39.0–52.0)
Hemoglobin: 9.1 g/dL — ABNORMAL LOW (ref 13.0–17.0)
Immature Granulocytes: 1 %
Lymphocytes Relative: 3 %
Lymphs Abs: 0.4 10*3/uL — ABNORMAL LOW (ref 0.7–4.0)
MCH: 30.8 pg (ref 26.0–34.0)
MCHC: 33 g/dL (ref 30.0–36.0)
MCV: 93.6 fL (ref 80.0–100.0)
Monocytes Absolute: 0.6 10*3/uL (ref 0.1–1.0)
Monocytes Relative: 5 %
Neutro Abs: 9.5 10*3/uL — ABNORMAL HIGH (ref 1.7–7.7)
Neutrophils Relative %: 91 %
Platelets: 121 10*3/uL — ABNORMAL LOW (ref 150–400)
RBC: 2.95 MIL/uL — ABNORMAL LOW (ref 4.22–5.81)
RDW: 13.6 % (ref 11.5–15.5)
WBC: 10.5 10*3/uL (ref 4.0–10.5)
nRBC: 0 % (ref 0.0–0.2)

## 2022-07-28 LAB — URINALYSIS, W/ REFLEX TO CULTURE (INFECTION SUSPECTED)
Bilirubin Urine: NEGATIVE
Glucose, UA: NEGATIVE mg/dL
Hgb urine dipstick: NEGATIVE
Ketones, ur: 5 mg/dL — AB
Nitrite: POSITIVE — AB
Protein, ur: NEGATIVE mg/dL
Specific Gravity, Urine: 1.016 (ref 1.005–1.030)
WBC, UA: >50 WBC/hpf (ref 0–5)
pH: 5 (ref 5.0–8.0)

## 2022-07-28 LAB — COMPREHENSIVE METABOLIC PANEL
ALT: 32 U/L (ref 0–44)
AST: 28 U/L (ref 15–41)
Albumin: 2.6 g/dL — ABNORMAL LOW (ref 3.5–5.0)
Alkaline Phosphatase: 64 U/L (ref 38–126)
Anion gap: 9 (ref 5–15)
BUN: 29 mg/dL — ABNORMAL HIGH (ref 8–23)
CO2: 23 mmol/L (ref 22–32)
Calcium: 8.2 mg/dL — ABNORMAL LOW (ref 8.9–10.3)
Chloride: 97 mmol/L — ABNORMAL LOW (ref 98–111)
Creatinine, Ser: 0.84 mg/dL (ref 0.61–1.24)
GFR, Estimated: >60 mL/min (ref >60)
Glucose, Bld: 145 mg/dL — ABNORMAL HIGH (ref 70–99)
Potassium: 4.1 mmol/L (ref 3.5–5.1)
Sodium: 129 mmol/L — ABNORMAL LOW (ref 135–145)
Total Bilirubin: 1 mg/dL (ref 0.3–1.2)
Total Protein: 5.9 g/dL — ABNORMAL LOW (ref 6.5–8.1)

## 2022-07-28 LAB — MRSA NEXT GEN BY PCR, NASAL: MRSA by PCR Next Gen: DETECTED — AB

## 2022-07-28 LAB — GLUCOSE, CAPILLARY: Glucose-Capillary: 154 mg/dL — ABNORMAL HIGH (ref 70–99)

## 2022-07-28 LAB — PROTIME-INR
INR: 1.2 (ref 0.8–1.2)
Prothrombin Time: 15.1 seconds (ref 11.4–15.2)

## 2022-07-28 LAB — RESP PANEL BY RT-PCR (RSV, FLU A&B, COVID)  RVPGX2
Influenza A by PCR: NEGATIVE
Influenza B by PCR: NEGATIVE
Resp Syncytial Virus by PCR: NEGATIVE
SARS Coronavirus 2 by RT PCR: NEGATIVE

## 2022-07-28 LAB — CULTURE, BLOOD (ROUTINE X 2): Special Requests: ADEQUATE

## 2022-07-28 LAB — BRAIN NATRIURETIC PEPTIDE: B Natriuretic Peptide: 1615.2 pg/mL — ABNORMAL HIGH (ref 0.0–100.0)

## 2022-07-28 MED ORDER — ASPIRIN 81 MG PO TBEC
81.0000 mg | DELAYED_RELEASE_TABLET | Freq: Every day | ORAL | Status: DC
  Administered 2022-07-28 – 2022-08-03 (×7): 81 mg via ORAL
  Filled 2022-07-28 (×7): qty 1

## 2022-07-28 MED ORDER — DOCUSATE SODIUM 100 MG PO CAPS
100.0000 mg | ORAL_CAPSULE | Freq: Two times a day (BID) | ORAL | Status: DC
  Administered 2022-07-28 – 2022-08-03 (×12): 100 mg via ORAL
  Filled 2022-07-28 (×12): qty 1

## 2022-07-28 MED ORDER — FUROSEMIDE 10 MG/ML IJ SOLN
40.0000 mg | Freq: Two times a day (BID) | INTRAMUSCULAR | Status: AC
  Administered 2022-07-28 – 2022-07-29 (×2): 40 mg via INTRAVENOUS
  Filled 2022-07-28 (×2): qty 4

## 2022-07-28 MED ORDER — VANCOMYCIN HCL 1500 MG/300ML IV SOLN
1500.0000 mg | Freq: Once | INTRAVENOUS | Status: AC
  Administered 2022-07-28: 1500 mg via INTRAVENOUS
  Filled 2022-07-28: qty 300

## 2022-07-28 MED ORDER — ONDANSETRON HCL 4 MG/2ML IJ SOLN: 4.0000 mg | Freq: Four times a day (QID) | INTRAMUSCULAR | Status: DC | PRN

## 2022-07-28 MED ORDER — FUROSEMIDE 10 MG/ML IJ SOLN
40.0000 mg | Freq: Once | INTRAMUSCULAR | Status: AC
  Administered 2022-07-28: 40 mg via INTRAVENOUS
  Filled 2022-07-28: qty 4

## 2022-07-28 MED ORDER — SODIUM CHLORIDE (PF) 0.9 % IJ SOLN
INTRAMUSCULAR | Status: AC
  Filled 2022-07-28: qty 50

## 2022-07-28 MED ORDER — SODIUM CHLORIDE 0.9 % IV SOLN
2.0000 g | Freq: Once | INTRAVENOUS | Status: AC
  Administered 2022-07-28: 2 g via INTRAVENOUS
  Filled 2022-07-28: qty 12.5

## 2022-07-28 MED ORDER — TAMSULOSIN HCL 0.4 MG PO CAPS
0.4000 mg | ORAL_CAPSULE | Freq: Every day | ORAL | Status: DC
  Administered 2022-07-28 – 2022-08-02 (×6): 0.4 mg via ORAL
  Filled 2022-07-28 (×6): qty 1

## 2022-07-28 MED ORDER — ORAL CARE MOUTH RINSE
15.0000 mL | OROMUCOSAL | Status: DC
  Administered 2022-07-28 – 2022-08-01 (×10): 15 mL via OROMUCOSAL

## 2022-07-28 MED ORDER — TRAZODONE HCL 50 MG PO TABS: 25.0000 mg | ORAL_TABLET | Freq: Every evening | ORAL | Status: DC | PRN

## 2022-07-28 MED ORDER — ACETAMINOPHEN 325 MG PO TABS: 650.0000 mg | ORAL_TABLET | Freq: Four times a day (QID) | ORAL | Status: DC | PRN

## 2022-07-28 MED ORDER — SODIUM CHLORIDE 0.9 % IV SOLN
1.0000 g | Freq: Three times a day (TID) | INTRAVENOUS | Status: DC
  Filled 2022-07-28: qty 10

## 2022-07-28 MED ORDER — SODIUM CHLORIDE 0.9 % IV BOLUS: 500.0000 mL | Freq: Once | INTRAVENOUS | Status: DC

## 2022-07-28 MED ORDER — ENOXAPARIN SODIUM 40 MG/0.4ML IJ SOSY
40.0000 mg | PREFILLED_SYRINGE | INTRAMUSCULAR | Status: DC
  Administered 2022-07-28 – 2022-08-02 (×6): 40 mg via SUBCUTANEOUS
  Filled 2022-07-28 (×6): qty 0.4

## 2022-07-28 MED ORDER — IOHEXOL 300 MG/ML  SOLN
100.0000 mL | Freq: Once | INTRAMUSCULAR | Status: AC | PRN
  Administered 2022-07-28: 100 mL via INTRAVENOUS

## 2022-07-28 MED ORDER — VANCOMYCIN HCL 750 MG/150ML IV SOLN
750.0000 mg | Freq: Two times a day (BID) | INTRAVENOUS | Status: DC
  Administered 2022-07-28 – 2022-07-29 (×2): 750 mg via INTRAVENOUS
  Filled 2022-07-28 (×3): qty 150

## 2022-07-28 MED ORDER — CHLORHEXIDINE GLUCONATE CLOTH 2 % EX PADS
6.0000 | MEDICATED_PAD | Freq: Every day | CUTANEOUS | Status: DC
  Administered 2022-07-28 – 2022-08-03 (×5): 6 via TOPICAL

## 2022-07-28 MED ORDER — POLYETHYLENE GLYCOL 3350 17 G PO PACK
17.0000 g | PACK | Freq: Every day | ORAL | Status: DC | PRN
  Administered 2022-07-31: 17 g via ORAL
  Filled 2022-07-28: qty 1

## 2022-07-28 MED ORDER — ORAL CARE MOUTH RINSE: 15.0000 mL | OROMUCOSAL | Status: DC | PRN

## 2022-07-28 MED ORDER — ALBUTEROL SULFATE (2.5 MG/3ML) 0.083% IN NEBU: 2.5000 mg | INHALATION_SOLUTION | RESPIRATORY_TRACT | Status: DC | PRN

## 2022-07-28 MED ORDER — SODIUM CHLORIDE 0.9 % IV SOLN
2.0000 g | Freq: Three times a day (TID) | INTRAVENOUS | Status: DC
  Administered 2022-07-28 – 2022-07-29 (×4): 2 g via INTRAVENOUS
  Filled 2022-07-28 (×4): qty 12.5

## 2022-07-28 MED ORDER — LORATADINE 10 MG PO TABS
10.0000 mg | ORAL_TABLET | Freq: Every day | ORAL | Status: DC
  Administered 2022-07-28 – 2022-08-03 (×7): 10 mg via ORAL
  Filled 2022-07-28 (×7): qty 1

## 2022-07-28 MED ORDER — ACETAMINOPHEN 650 MG RE SUPP: 650.0000 mg | Freq: Four times a day (QID) | RECTAL | Status: DC | PRN

## 2022-07-28 MED ORDER — ACETAMINOPHEN 500 MG PO TABS
1000.0000 mg | ORAL_TABLET | Freq: Once | ORAL | Status: AC
  Administered 2022-07-28: 1000 mg via ORAL
  Filled 2022-07-28: qty 2

## 2022-07-28 MED ORDER — CARVEDILOL 3.125 MG PO TABS
3.1250 mg | ORAL_TABLET | Freq: Two times a day (BID) | ORAL | Status: DC
  Administered 2022-07-28 – 2022-08-01 (×9): 3.125 mg via ORAL
  Filled 2022-07-28 (×9): qty 1

## 2022-07-28 MED ORDER — ONDANSETRON HCL 4 MG PO TABS: 4.0000 mg | ORAL_TABLET | Freq: Four times a day (QID) | ORAL | Status: DC | PRN

## 2022-07-28 NOTE — Progress Notes (Signed)
Pharmacy Antibiotic Note  [name] is a 82 y.o. male who was recently hospitalized 5/16-5/21 for back pain and hyponatremia. He presented to the ED on 07/28/2022 with complaint of sudden onset of fever and SOB. He had severe respiratory distress early this morning at the skilled nursing facility. On arrival to the ED he continued to require 8 L high flow nasal cannula to maintain oxygen saturations, he was tachycardic and febrile but otherwise hemodynamically stable. CT chest on 6/20 showed hazy diffuse ground-glass opacities in the lungs bilaterally, greater on the right than on the left, possible edema versus multifocal pneumonia. Pharmacy has been consulted for Vancomycin and Cefepime dosing for pneumonia.   Today, 07/28/22: - Resp panel: negative  - Scr: 0.84 (crcl: 74.2 mL/min) - WBC: 10.5   Plan: - Start vancomycin 750 mg q12h (est AUC 420, Scr: 0.84 mg/dL) - Adjust Cefepime dose to 2 gm q8h for pneumonia indication and renal function - Follow up blood/urine cultures, fever    Height: 5\' 11"  (180.3 cm) Weight: 74.8 kg (165 lb) IBW/kg (Calculated) : 75.3  Temp (24hrs), Avg:100.9 F (38.3 C), Min:98.6 F (37 C), Max:102.4 F (39.1 C)  Recent Labs  Lab 07/28/22 0209 07/28/22 0531  WBC 10.5  --   CREATININE 0.84  --   LATICACIDVEN 1.3 0.9    Estimated Creatinine Clearance: 74.2 mL/min (by C-G formula based on SCr of 0.84 mg/dL).    Allergies  Allergen Reactions   Sulfamethoxazole-Trimethoprim Rash    Antimicrobials this admission: 6/20 Vancomycin >>  6/20 Cefepime >>   Microbiology results: BCx: pending UCx: pending   Thank you for allowing pharmacy to be a part of this patient's care.  Erasmo Leventhal, PharmD Candidate 07/28/2022 7:52 AM

## 2022-07-28 NOTE — Progress Notes (Signed)
A consult was received from an ED physician for Vancomycin + Cefepime per pharmacy dosing.  The patient's profile has been reviewed for ht/wt/allergies/indication/available labs.   A one time order has been placed for Cefepime 2gm + Vancomycin 1500 mg IV.  Further antibiotics/pharmacy consults should be ordered by admitting physician if indicated.                       Thank you, Junita Push PharmD 07/28/2022  3:14 AM

## 2022-07-28 NOTE — ED Provider Notes (Signed)
[name] EMERGENCY DEPARTMENT AT [facility] Provider Note  CSN: [id number] Arrival date & time: 07/28/22 4098  Chief Complaint(s) Fever and Shortness of Breath  HPI [name] is a 82 y.o. 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  Patient reported severe respiratory distress this morning at his skilled living facility.  Patient found to be 79% on room air breathing 42 times a minute.  Patient with wheezing initially on arrival and received 125 Solu-Medrol, 10 of albuterol and 1 of Atrovent prior to arrival.  No wheezing here in the emergency room but patient remains hypoxic and is requiring 8 L high flow nasal cannula to maintain oxygen saturations.  Denies abdominal pain, nausea, vomiting, headache or other systemic symptoms.  Patient arrives tachycardic and febrile.    Past Medical History 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   PAC (premature atrial contraction) 03/01/2021   PVC (premature ventricular contraction) 03/01/2021   Atrial bigeminy 02/28/2020   OA (osteoarthritis) of knee 08/07/2019   Primary osteoarthritis of right knee 08/07/2019   Coronary artery disease involving coronary bypass graft of native heart without angina pectoris 09/19/2018   Fatigue 08/01/2018    Mixed hyperlipidemia 06/06/2018   S/P left atrial appendage ligation 07/25/2017   S/P AVR 07/25/2017   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   Coronary artery disease without angina pectoris 07/12/2017   Mitral regurgitation 07/07/2017   (HFpEF) heart failure with preserved ejection fraction (HCC) 07/07/2017   Gout 02/12/2013   Essential hypertension 02/12/2013   GERD (gastroesophageal reflux disease) 02/12/2013   Home Medication(s) Prior to Admission medications   Medication Sig Start Date End Date Taking? Authorizing Provider  acetaminophen (TYLENOL) 500 MG tablet Take 2 tablets (1,000 mg total) by mouth every 6 (six) hours as needed for mild pain or fever. 07/29/17   Barrett, Erin R, PA-C  ascorbic acid (VITAMIN C) 500 MG tablet Take 1 tablet (500 mg total) by mouth daily. 03/25/22   Ngetich, Dinah C, NP  aspirin EC 81 MG tablet Take 81 mg by mouth daily. Swallow whole.    [provider]  carvedilol (COREG) 3.125 MG tablet Take 1 tablet (3.125 mg total) by mouth 2 (two) times daily. 06/28/22 06/28/23  Leroy Sea, MD  Cetirizine HCl (ZYRTEC ALLERGY) 10 MG CAPS Take 10 mg by mouth daily as needed (as needed for allergies). 05/09/22   [provider]  diclofenac Sodium (VOLTAREN) 1 % GEL Apply 2 g topically 4 (four) times daily. To low back 06/28/22   Leroy Sea, MD  lactulose (CEPHULAC) 20 g packet Take 1 packet (20 g total) by mouth 3 (three) times daily as needed (constipation). 06/28/22   Leroy Sea, MD  lidocaine (LIDODERM) 5 % Place 1 patch onto the skin daily. Remove & Discard patch within 12 hours or as directed by MD 06/28/22   Leroy Sea, MD  Multiple Vitamin (MULTIVITAMIN ADULT PO) Take 1 tablet by mouth daily.    [provider]  Omega-3 Fatty Acids (FISH OIL) 1000 MG CAPS Take 2 capsules by mouth daily.    [provider]  silodosin (RAPAFLO) 8 MG CAPS capsule Take 8 mg by mouth daily. 11/15/21   [provider]                                                                                                                                     Past Surgical History Past Surgical History:  Procedure Laterality Date   AORTIC VALVE REPLACEMENT N/A 07/24/2017   Procedure: AORTIC VALVE REPLACEMENT (AVR) using 23mm Inspiris Aortic Valve;  Surgeon: Alleen Borne, MD;  Location: [facility] OR;  Service: Open Heart Surgery;  Laterality: N/A;   CLIPPING OF ATRIAL APPENDAGE N/A 07/24/2017   Procedure: CLIPPING OF ATRIAL APPENDAGE using a 50mm Atricure clip;  Surgeon: Alleen Borne, MD;  Location: [facility] OR;  Service: Open Heart Surgery;  Laterality: N/A;   CORONARY ARTERY BYPASS GRAFT N/A 07/24/2017   Procedure: CORONARY ARTERY BYPASS GRAFTING (CABG) times 3 using the left greater saphenous vein harvested endoscopically and left internal mammary artery.;  Surgeon: Alleen Borne, MD;  Location: [facility] OR;  Service: Open Heart Surgery;  Laterality: N/A;   EYE SURGERY Bilateral    lens replacements for cataracts   HERNIA REPAIR     INGUINAL HERNIA REPAIR Right 05/06/2015   Procedure: LAPAROSCOPIC RIGHT INGUINAL HERNIA WITH MESH;  Surgeon: Abigail Miyamoto, MD;  Location: [facility] ORS;  Service: General;  Laterality: Right;   INSERTION OF MESH Right 05/06/2015   Procedure: INSERTION OF MESH;  Surgeon: Abigail Miyamoto, MD;  Location: [facility] ORS;  Service: General;  Laterality: Right;   RIGHT/LEFT HEART CATH AND CORONARY ANGIOGRAPHY N/A 07/10/2017   Procedure: RIGHT/LEFT HEART CATH AND CORONARY ANGIOGRAPHY;  Surgeon: Elder Negus, MD;  Location: [facility] INVASIVE CV LAB;  Service: Cardiovascular;  Laterality: N/A;   TEE WITHOUT CARDIOVERSION N/A 07/11/2017   Procedure: TRANSESOPHAGEAL ECHOCARDIOGRAM (TEE);  Surgeon: Elder Negus, MD;  Location: [facility] ENDOSCOPY;  Service: Cardiovascular;  Laterality: N/A;   TEE WITHOUT CARDIOVERSION N/A 07/24/2017   Procedure: TRANSESOPHAGEAL ECHOCARDIOGRAM (TEE);  Surgeon: Alleen Borne, MD;  Location[facility] OR;  Service: Open  Heart Surgery;  Laterality: N/A;   TOTAL KNEE ARTHROPLASTY Right 08/07/2019   Procedure: TOTAL KNEE ARTHROPLASTY;  Surgeon: Ollen Gross, MD;  Location: [facility] ORS;  Service: Orthopedics;  Laterality: Right;   ULTRASOUND GUIDANCE FOR VASCULAR ACCESS  07/10/2017   Procedure: Ultrasound Guidance For Vascular Access;  Surgeon: Elder Negus, MD;  Location: [facility] INVASIVE CV LAB;  Service: Cardiovascular;;   Family History Family History  Problem Relation Age of  Onset   Stroke Mother    Heart attack Father     Social History Social History   Tobacco Use   Smoking status: Former    Packs/day: 1.00    Years: 12.00    Additional pack years: 0.00    Total pack years: 12.00    Types: Cigarettes    Quit date: 40    Years since quitting: 51.5   Smokeless tobacco: Never   Tobacco comments:    quit 30-40 yrs ago  Vaping Use   Vaping Use: Never used  Substance Use Topics   Alcohol use: Yes    Comment: 1-2 drinks   Drug use: Yes    Types: Marijuana   Allergies Sulfamethoxazole-trimethoprim  Review of Systems Review of Systems  Constitutional:  Positive for fever.  Respiratory:  Positive for cough and shortness of breath.     Physical Exam Vital Signs  I have reviewed the triage vital signs BP 122/73   Pulse 91   Temp 98.6 F (37 C) (Oral)   Resp (!) 22   Ht 5\' 11"  (1.803 m)   Wt 74.8 kg   SpO2 97%   BMI 23.01 kg/m   Physical Exam Constitutional:      General: He is in acute distress.     Appearance: Normal appearance. He is ill-appearing.  HENT:     Head: Normocephalic and atraumatic.     Nose: No congestion or rhinorrhea.  Eyes:     General:        Right eye: No discharge.        Left eye: No discharge.     Extraocular Movements: Extraocular movements intact.     Pupils: Pupils are equal, round, and reactive to light.  Cardiovascular:     Rate and Rhythm: Regular rhythm. Tachycardia present.     Heart sounds: No murmur heard. Pulmonary:     Effort: No  respiratory distress.     Breath sounds: Rales present. No wheezing.  Abdominal:     General: There is no distension.     Tenderness: There is no abdominal tenderness.  Musculoskeletal:        General: Normal range of motion.     Cervical back: Normal range of motion.  Skin:    General: Skin is warm and dry.  Neurological:     General: No focal deficit present.     Mental Status: He is alert.     ED Results and Treatments Labs (all labs ordered are listed, but only abnormal results are displayed) Labs Reviewed  COMPREHENSIVE METABOLIC PANEL - Abnormal; Notable for the following components:      Result Value   Sodium 129 (*)    Chloride 97 (*)    Glucose, Bld 145 (*)    BUN 29 (*)    Calcium 8.2 (*)    Total Protein 5.9 (*)    Albumin 2.6 (*)    All other components within normal limits  CBC WITH DIFFERENTIAL/PLATELET - Abnormal; Notable for the following components:   RBC 2.95 (*)    Hemoglobin 9.1 (*)    HCT 27.6 (*)    Platelets 121 (*)    Neutro Abs 9.5 (*)    Lymphs Abs 0.4 (*)    All other components within normal limits  BRAIN NATRIURETIC PEPTIDE - Abnormal; Notable for the following components:   B Natriuretic Peptide 1,615.2 (*)    All other components within normal limits  RESP PANEL BY RT-PCR (RSV, FLU  A&B, COVID)  RVPGX2  CULTURE, BLOOD (ROUTINE X 2)  CULTURE, BLOOD (ROUTINE X 2)  LACTIC ACID, PLASMA  PROTIME-INR  LACTIC ACID, PLASMA  URINALYSIS, W/ REFLEX TO CULTURE (INFECTION SUSPECTED)                                                                                                                          Radiology DG Chest Port 1 View  Result Date: 07/28/2022 CLINICAL DATA:  Sepsis.  Fever and shortness of breath. EXAM: PORTABLE CHEST 1 VIEW COMPARISON:  06/26/2022. FINDINGS: Heart is enlarged in the mediastinal contour is within normal limits. A prosthetic cardiac valve and left atrial appendage clip are noted. There is atherosclerotic calcification  of the aorta. The pulmonary vasculature is distended. Diffuse hazy airspace opacities are noted in the lungs bilaterally, greater on the right than on the left. There are small bilateral pleural effusions. No pneumothorax. Sternotomy wires are noted. No acute osseous abnormality. IMPRESSION: 1. Cardiomegaly with pulmonary vascular congestion. 2. Diffuse airspace opacities bilaterally, possible edema or infiltrate. 3. Small bilateral pleural effusions. Electronically Signed   By: Thornell Sartorius M.D.   On: 07/28/2022 02:41    Pertinent labs & imaging results that were available during my care of the patient were reviewed by me and considered in my medical decision making (see MDM for details).  Medications Ordered in ED Medications  vancomycin (VANCOREADY) IVPB 1500 mg/300 mL (1,500 mg Intravenous New Bag/Given 07/28/22 0358)  furosemide (LASIX) injection 40 mg (has no administration in time range)  acetaminophen (TYLENOL) tablet 1,000 mg (1,000 mg Oral Given 07/28/22 0256)  ceFEPIme (MAXIPIME) 2 g in sodium chloride 0.9 % 100 mL IVPB (0 g Intravenous Stopped 07/28/22 0344)  iohexol (OMNIPAQUE) 300 MG/ML solution 100 mL (100 mLs Intravenous Contrast Given 07/28/22 0403)                                                                                                                                     Procedures .Critical Care  Performed by: Glendora Score, MD Authorized by: Glendora Score, MD   Critical care provider statement:    Critical care time (minutes):  30   Critical care was necessary to treat or prevent imminent or life-threatening deterioration of the following conditions:  Sepsis and respiratory failure   Critical care was time spent personally by me on the following activities:  Development of treatment  plan with patient or surrogate, discussions with consultants, evaluation of patient's response to treatment, examination of patient, ordering and review of laboratory studies, ordering and  review of radiographic studies, ordering and performing treatments and interventions, pulse oximetry, re-evaluation of patient's condition and review of old charts   (including critical care time)  Medical Decision Making / ED Course   This patient presents to the ED for concern of fever, shortness of breath, this involves an extensive number of treatment options, and is a complaint that carries with it a high risk of complications and morbidity.  The differential diagnosis includes Pe, PTX, Pulmonary Edema, ARDS, COPD/Asthma, ACS, CHF exacerbation, Arrhythmia, Pericardial Effusion/Tamponade, Anemia, Sepsis, Acidosis/Hypercapnia, Anxiety, Viral URI  MDM: Patient seen emergency room for evaluation of fever and shortness of breath.  Physical exam reveals an ill-appearing tachycardic tachypneic patient with rales at bilateral bases.  No persistent wheezing heard.  Patient is febrile tachycardic and hypoxic and sepsis labs obtained.  Broad-spectrum antibiotics initiated.  Laboratory evaluation with no significant leukocytosis but hemoglobin 9.1, mild hyponatremia 129, BNP is significantly elevated at 1615 which is significant elevated for this patient.  Lactic acid is normal, COVID flu RSV negative.  Chest x-ray concerning for diffuse bilateral opacities.  CT chest on pelvis obtained in the setting of sepsis of unknown origin that shows diffuse groundglass opacities in lungs bilaterally greater on the right than left edema versus multifocal pneumonia.  Bilateral pleural effusions with compressive atelectasis but no additional obvious source of his infection.  Patient remains on 8 L high flow nasal cannula to maintain oxygen saturations and require hospital admission for fluid overload.  Lasix initiated and patient admitted.   Additional history obtained:  -External records from outside source obtained and reviewed including: Chart review including previous notes, labs, imaging, consultation  notes   Lab Tests: -I ordered, reviewed, and interpreted labs.   The pertinent results include:   Labs Reviewed  COMPREHENSIVE METABOLIC PANEL - Abnormal; Notable for the following components:      Result Value   Sodium 129 (*)    Chloride 97 (*)    Glucose, Bld 145 (*)    BUN 29 (*)    Calcium 8.2 (*)    Total Protein 5.9 (*)    Albumin 2.6 (*)    All other components within normal limits  CBC WITH DIFFERENTIAL/PLATELET - Abnormal; Notable for the following components:   RBC 2.95 (*)    Hemoglobin 9.1 (*)    HCT 27.6 (*)    Platelets 121 (*)    Neutro Abs 9.5 (*)    Lymphs Abs 0.4 (*)    All other components within normal limits  BRAIN NATRIURETIC PEPTIDE - Abnormal; Notable for the following components:   B Natriuretic Peptide 1,615.2 (*)    All other components within normal limits  RESP PANEL BY RT-PCR (RSV, FLU A&B, COVID)  RVPGX2  CULTURE, BLOOD (ROUTINE X 2)  CULTURE, BLOOD (ROUTINE X 2)  LACTIC ACID, PLASMA  PROTIME-INR  LACTIC ACID, PLASMA  URINALYSIS, W/ REFLEX TO CULTURE (INFECTION SUSPECTED)      EKG   EKG Interpretation  Date/Time:  Thursday July 28 2022 02:08:44 EDT Ventricular Rate:  108 PR Interval:  157 QRS Duration: 115 QT Interval:  362 QTC Calculation: 486 R Axis:   93 Text Interpretation: Sinus tachycardia Atrial premature complex Incomplete right bundle branch block Confirmed by Janautica Netzley ([id number]) on 07/28/2022 4:50:54 AM         Imaging Studies ordered:  I ordered imaging studies including chest x-ray, CT chest abdomen pelvis I independently visualized and interpreted imaging. I agree with the radiologist interpretation   Medicines ordered and prescription drug management: Meds ordered this encounter  Medications   acetaminophen (TYLENOL) tablet 1,000 mg   DISCONTD: sodium chloride 0.9 % bolus 500 mL   vancomycin (VANCOREADY) IVPB 1500 mg/300 mL    Order Specific Question:   Indication:    Answer:   Other Indication (list  below)   ceFEPIme (MAXIPIME) 2 g in sodium chloride 0.9 % 100 mL IVPB    Order Specific Question:   Antibiotic Indication:    Answer:   Other Indication (list below)   iohexol (OMNIPAQUE) 300 MG/ML solution 100 mL   furosemide (LASIX) injection 40 mg    -I have reviewed the patients home medicines and have made adjustments as needed  Critical interventions Broad-spectrum antibiotics, supplemental oxygen   Cardiac Monitoring: The patient was maintained on a cardiac monitor.  I personally viewed and interpreted the cardiac monitored which showed an underlying rhythm of: Sinus tachycardia, NSR  Social Determinants of Health:  Factors impacting patients care include: Lives in skilled nursing facility   Reevaluation: After the interventions noted above, I reevaluated the patient and found that they have :improved  Co morbidities that complicate the patient evaluation  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    HFrEF (heart failure with reduced ejection fraction) (HCC)    Hypertension    Moderate mitral regurgitation    Severe aortic stenosis       Dispostion: I considered admission for this patient, and due to fluid overload and fever of unknown origin patient require hospital admission     Final Clinical Impression(s) / ED Diagnoses Final diagnoses:  None     @PCDICTATION @    Glendora Score, MD 07/28/22 0451

## 2022-07-28 NOTE — ED Triage Notes (Signed)
Pt arrives via EMS from [place] [facility] where EMS reports pt was in severe respiratory distress, with extensive accessory muscle use, RA SPO2 79% at that time with a respiratory rate of approx 42. Pt denies any history of COPD or CHF, has previous smoking history and history of CABG. Pt currently still exhibits moderate work of breathing at this time, Dr. Posey Rea requested to pt bedside for rapid assessment. Pt was given 125mg  IVP Solumedrol, 10mg  Albuterol nebulizer, and 1mg  atrovent nebulizer en route via EMS. Pt currently remains on nebulizer mask at 8L. Pt also informs nurse that he was recently placed on antibiotic therapy for a "spinal infection." Pt tachypneic, tachycardic, febrile at this time and sepsis alert activated by charge nurse prior to patient arrival.

## 2022-07-28 NOTE — ED Notes (Signed)
ED TO INPATIENT HANDOFF REPORT  Name/Age/Gender [name] 82 y.o. male  Code Status    Code Status Orders  (From admission, onward)           Start     Ordered   07/28/22 0731  Full code  Continuous       Question:  By:  Answer:  Consent: discussion documented in EHR   07/28/22 0731           Code Status History     Date Active Date Inactive Code Status Order ID Comments User Context   06/24/2022 0359 06/28/2022 1855 Full Code [id number]  Briscoe Deutscher, MD ED   09/20/2021 1028 12/01/2021 1004 Full Code [id number]  Ngetich, Donalee Citrin, NP Outpatient   08/07/2019 1411 08/08/2019 1709 Full Code [id number]  Derenda Fennel, PA Inpatient   07/24/2017 1411 07/29/2017 1443 Full Code [id number]  Zada Girt Inpatient   07/10/2017 1139 07/12/2017 1553 Full Code [id number]  Elder Negus, MD Inpatient   07/07/2017 1412 07/10/2017 1139 Full Code [id number]  Elder Negus, MD Inpatient      Advance Directive Documentation    Flowsheet Row Most Recent Value  Type of Advance Directive Healthcare Power of Attorney, Living will  Pre-existing out of facility DNR order (yellow form or pink MOST form) --  "MOST" Form in Place? --       Home/SNF/Other Home  Chief Complaint Acute hypoxic respiratory failure (HCC) [J96.01]  Level of Care/Admitting Diagnosis ED Disposition     ED Disposition  Admit   Condition  --   Comment  Hospital Area: [facility] [[id number]]  Level of Care: Stepdown [14]  Admit to SDU based on following criteria: Respiratory Distress:  Frequent assessment and/or intervention to maintain adequate ventilation/respiration, pulmonary toilet, and respiratory treatment.  May admit patient to [facility] or [facility] if equivalent level of care is available:: Yes  Covid Evaluation: Confirmed COVID Negative  Diagnosis: Acute hypoxic respiratory failure [facility]) [[id number]]  Admitting Physician: Maryln Gottron [[id number]]  Attending  Physician: Kirby Crigler, MIR [email or web address] [[id number]]  Certification:: I certify this patient will need inpatient services for at least 2 midnights  Estimated Length of Stay: 3          Medical History 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    HFrEF (heart failure with reduced ejection fraction) (HCC)    Hypertension    Moderate mitral regurgitation    Severe aortic stenosis     Allergies Allergies  Allergen Reactions   Sulfamethoxazole-Trimethoprim Rash    IV Location/Drains/Wounds Patient Lines/Drains/Airways Status     Active Line/Drains/Airways     Name Placement date Placement time Site Days   Peripheral IV 07/28/22 20 G Left Antecubital 07/28/22  0115  Antecubital  less than 1   Peripheral IV 07/28/22 20 G 1" Right Antecubital 07/28/22  0214  Antecubital  less than 1   External Urinary Catheter 07/28/22  0301  --  less than 1            Labs/Imaging Results for orders placed or performed during the hospital encounter of 07/28/22 (from the past 48 hour(s))  Comprehensive metabolic panel     Status: Abnormal   Collection Time: 07/28/22  2:09 AM  Result Value Ref Range   Sodium 129 (L) 135 - 145 mmol/L   Potassium 4.1 3.5 - 5.1 mmol/L   Chloride 97 (L) 98 - 111 mmol/L   CO2 23 22 - 32 mmol/L   Glucose, Bld 145 (H) 70 - 99 mg/dL    Comment: Glucose reference range applies only to samples taken after fasting for at least 8 hours.   BUN 29 (H) 8 - 23 mg/dL   Creatinine, Ser 1.61 0.61 - 1.24 mg/dL   Calcium 8.2 (L) 8.9 - 10.3 mg/dL   Total Protein 5.9 (L) 6.5 - 8.1 g/dL   Albumin 2.6 (L) 3.5 - 5.0 g/dL   AST 28 15 - 41 U/L   ALT 32 0 - 44 U/L   Alkaline Phosphatase 64 38 - 126 U/L   Total Bilirubin 1.0 0.3 - 1.2 mg/dL   GFR, Estimated >09 >60 mL/min    Comment: (NOTE) Calculated using the CKD-EPI Creatinine Equation (2021)     Anion gap 9 5 - 15    Comment: Performed at [facility], [address] [place], [place] [address]  Lactic acid, plasma     Status: None   Collection Time: 07/28/22  2:09 AM  Result Value Ref Range   Lactic Acid, Venous 1.3 0.5 - 1.9 mmol/L    Comment: Performed at [facility], [address] [place], [place] [address]  CBC with Differential     Status: Abnormal   Collection Time: 07/28/22  2:09 AM  Result Value Ref Range   WBC 10.5 4.0 - 10.5 K/uL   RBC 2.95 (L) 4.22 - 5.81 MIL/uL   Hemoglobin 9.1 (L) 13.0 - 17.0 g/dL   HCT 47.8 (L) 29.5 - 62.1 %   MCV 93.6 80.0 - 100.0 fL   MCH 30.8 26.0 - 34.0 pg   MCHC 33.0 30.0 - 36.0 g/dL   RDW 30.8 65.7 - 84.6 %   Platelets 121 (L) 150 - 400 K/uL   nRBC 0.0 0.0 - 0.2 %   Neutrophils Relative % 91 %   Neutro Abs 9.5 (H) 1.7 - 7.7 K/uL   Lymphocytes Relative 3 %   Lymphs Abs 0.4 (L) 0.7 - 4.0 K/uL   Monocytes Relative 5 %   Monocytes Absolute 0.6 0.1 - 1.0 K/uL   Eosinophils Relative 0 %   Eosinophils Absolute 0.0 0.0 - 0.5 K/uL   Basophils Relative 0 %   Basophils Absolute 0.0 0.0 - 0.1 K/uL   Immature Granulocytes 1 %   Abs Immature Granulocytes 0.05 0.00 - 0.07 K/uL    Comment: Performed at [facility], [address] [place], [place] [address]  Protime-INR     Status: None   Collection Time: 07/28/22  2:09 AM  Result Value Ref Range   Prothrombin Time 15.1 11.4 - 15.2 seconds   INR 1.2 0.8 - 1.2    Comment: (NOTE) INR goal varies based on device and disease states. Performed at [facility], [address], [place], [place] [address]   Brain natriuretic peptide     Status: Abnormal   Collection Time: 07/28/22  2:09 AM  Result Value Ref Range   B Natriuretic Peptide 1,615.2 (H) 0.0 - 100.0 pg/mL    Comment: Performed at [facility], [address], [place], [place] [address]  Resp panel by RT-PCR (RSV, Flu A&B, Covid)  Peripheral     Status: None   Collection Time: 07/28/22  3:07 AM   Specimen: Peripheral; Nasal Swab  Result Value Ref Range   SARS Coronavirus 2 by RT PCR NEGATIVE NEGATIVE    Comment: (NOTE) SARS-CoV-2 target nucleic acids are NOT DETECTED.  The SARS-CoV-2 RNA is generally detectable in upper respiratory specimens during the acute phase of infection. The lowest concentration of SARS-CoV-2 viral copies this assay can detect is 138 copies/mL. A negative result does not preclude SARS-Cov-2 infection and should not be used as the sole basis for treatment or other patient management decisions. A negative result may occur with  improper specimen collection/handling, submission of specimen other than nasopharyngeal swab, presence of viral mutation(s) within the areas targeted by this assay, and inadequate number of viral copies(<138 copies/mL). A negative result must be combined with clinical observations, patient history, and epidemiological information. The expected result is Negative.  Fact Sheet for Patients:  BloggerCourse.com  Fact Sheet for Healthcare Providers:  SeriousBroker.it  This test is no t yet approved or cleared by the [place] FDA and  has been authorized for detection and/or diagnosis of SARS-CoV-2 by FDA under an Emergency Use Authorization (EUA). This EUA will remain  in effect (meaning this test can be used) for the duration of the COVID-19 declaration under Section 564(b)(1) of the Act, 21 U.S.C.section 360bbb-3(b)(1), unless the authorization is terminated  or revoked sooner.       Influenza A by PCR NEGATIVE NEGATIVE   Influenza B by PCR NEGATIVE NEGATIVE    Comment: (NOTE) The Xpert Xpress SARS-CoV-2/FLU/RSV plus assay is intended as an aid in the diagnosis of influenza from Nasopharyngeal swab specimens and should not be used as a sole basis for treatment. Nasal washings and aspirates are  unacceptable for Xpert Xpress SARS-CoV-2/FLU/RSV testing.  Fact Sheet for Patients: BloggerCourse.com  Fact Sheet for Healthcare Providers: SeriousBroker.it  This test is not yet approved or cleared by the [place] FDA and has been authorized for detection and/or diagnosis of SARS-CoV-2 by FDA under an Emergency Use Authorization (EUA). This EUA will remain in effect (meaning this test can be used) for the duration of the COVID-19 declaration under Section 564(b)(1) of the Act, 21 U.S.C. section 360bbb-3(b)(1), unless the authorization is terminated or revoked.     Resp Syncytial Virus by PCR NEGATIVE NEGATIVE    Comment: (NOTE) Fact Sheet for Patients: BloggerCourse.com  Fact Sheet for Healthcare Providers: SeriousBroker.it  This test is not yet approved or cleared by the [place] FDA and has been authorized for detection and/or diagnosis of SARS-CoV-2 by FDA under an Emergency Use Authorization (EUA). This EUA will remain in effect (meaning this test can be used) for the duration of the COVID-19 declaration under Section 564(b)(1) of the Act, 21 U.S.C. section 360bbb-3(b)(1), unless the authorization is terminated or revoked.  Performed at [facility], [address], [place], [place] [address]   Lactic acid, plasma     Status: None   Collection Time: 07/28/22  5:31 AM  Result Value Ref Range   Lactic Acid, Venous 0.9 0.5 - 1.9 mmol/L    Comment: Performed at [facility], [address] [place], [place] [address]  Urinalysis, w/ Reflex to Culture (Infection Suspected) -Urine, Clean Catch     Status: Abnormal   Collection Time: 07/28/22  5:56 AM  Result Value Ref Range   Specimen Source URINE, CATHETERIZED    Color, Urine YELLOW YELLOW   APPearance CLOUDY (A) CLEAR   Specific Gravity, Urine 1.016 1.005 - 1.030  pH 5.0  5.0 - 8.0   Glucose, UA NEGATIVE NEGATIVE mg/dL   Hgb urine dipstick NEGATIVE NEGATIVE   Bilirubin Urine NEGATIVE NEGATIVE   Ketones, ur 5 (A) NEGATIVE mg/dL   Protein, ur NEGATIVE NEGATIVE mg/dL   Nitrite POSITIVE (A) NEGATIVE   Leukocytes,Ua LARGE (A) NEGATIVE   RBC / HPF 6-10 0 - 5 RBC/hpf   WBC, UA >50 0 - 5 WBC/hpf    Comment:        Reflex urine culture not performed if WBC <=10, OR if Squamous epithelial cells >5. If Squamous epithelial cells >5 suggest recollection.    Bacteria, UA MANY (A) NONE SEEN   Squamous Epithelial / HPF 0-5 0 - 5 /HPF   Mucus PRESENT     Comment: Performed at [facility], [address], [place], [place] [address]   CT CHEST ABDOMEN PELVIS W CONTRAST  Result Date: 07/28/2022 CLINICAL DATA:  Sepsis. Shortness of breath, elevated white count, CHF, hepatitis. EXAM: CT CHEST, ABDOMEN, AND PELVIS WITH CONTRAST TECHNIQUE: Multidetector CT imaging of the chest, abdomen and pelvis was performed following the standard protocol during bolus administration of intravenous contrast. RADIATION DOSE REDUCTION: This exam was performed according to the departmental dose-optimization program which includes automated exposure control, adjustment of the mA and/or kV according to patient size and/or use of iterative reconstruction technique. CONTRAST:  OMNIPAQUE IOHEXOL 300 MG/ML  SOLN COMPARISON:  06/24/2022. FINDINGS: CT CHEST FINDINGS Cardiovascular: The heart is normal in size and there is no pericardial effusion. Multi-vessel coronary artery calcifications are noted. There is atherosclerotic calcification of the aorta without evidence of aneurysm. The pulmonary trunk is normal in caliber. Mediastinum/Nodes: Enlarged lymph nodes are present in the mediastinum measuring up to 1.5 cm in the precarinal space. No hilar or axillary lymphadenopathy. A subcentimeter hypodensity is noted in the left lobe of the thyroid gland. No additional imaging is  recommended. The trachea and esophagus are within normal limits. There is a small hiatal hernia. Lungs/Pleura: There are small bilateral pleural effusions bilaterally with compressive atelectasis. Diffuse hazy airspace opacities are present in the lungs bilaterally, greater on the right than on the left. No pneumothorax. Musculoskeletal: Sternotomy wires are noted. Degenerative changes are present in the thoracic spine. No acute osseous abnormality is seen. CT ABDOMEN PELVIS FINDINGS Hepatobiliary: Cyst is noted in the left lobe of the liver. A stable hypodensity is noted in the posterior right lobe of the liver measuring 4.5 cm. The gallbladder is without stones. No biliary ductal dilatation is seen. Pancreas: Unremarkable. No pancreatic ductal dilatation or surrounding inflammatory changes. Spleen: Normal in size without focal abnormality. Adrenals/Urinary Tract: The adrenal glands are within normal limits. The kidneys enhance symmetrically. There is a cyst in the lower pole of the left kidney. A slightly complex hypodensity is noted in the lower pole of the right kidney measuring 2 cm. No renal calculus or hydronephrosis. The bladder is unremarkable. Stomach/Bowel: There is a small hiatal hernia. Stomach is within normal limits. Appendix appears normal. No evidence of bowel wall thickening, distention, or inflammatory changes. No free air or pneumatosis. Scattered diverticula are present along the colon without evidence of diverticulitis. Vascular/Lymphatic: Aortic atherosclerosis. No enlarged abdominal or pelvic lymph nodes. Reproductive: Prostate is unremarkable. Other: Small amount free fluid in the pelvis. Musculoskeletal: Degenerative changes are present in the lumbar spine. No acute fracture is seen. IMPRESSION: 1. Hazy diffuse ground-glass opacities in the lungs bilaterally, greater on the right than on the left, possible edema  versus multifocal pneumonia. 2. Small bilateral pleural effusions with  compressive atelectasis. 3. Stable indeterminate hypodensity in the posterior right lobe of the liver measuring 4.5 cm. MRI with contrast is recommended for further evaluation on nonemergent follow-up given history of hepatitis. 4. Small ascites in the pelvis. 5. Complex cyst in the mid right kidney measuring 2 cm. Lesion may also be better evaluated on MRI. 6. Small hiatal hernia. 7. Aortic atherosclerosis and coronary artery calcifications. Electronically Signed   By: Thornell Sartorius M.D.   On: 07/28/2022 04:42   DG Chest Port 1 View  Result Date: 07/28/2022 CLINICAL DATA:  Sepsis.  Fever and shortness of breath. EXAM: PORTABLE CHEST 1 VIEW COMPARISON:  06/26/2022. FINDINGS: Heart is enlarged in the mediastinal contour is within normal limits. A prosthetic cardiac valve and left atrial appendage clip are noted. There is atherosclerotic calcification of the aorta. The pulmonary vasculature is distended. Diffuse hazy airspace opacities are noted in the lungs bilaterally, greater on the right than on the left. There are small bilateral pleural effusions. No pneumothorax. Sternotomy wires are noted. No acute osseous abnormality. IMPRESSION: 1. Cardiomegaly with pulmonary vascular congestion. 2. Diffuse airspace opacities bilaterally, possible edema or infiltrate. 3. Small bilateral pleural effusions. Electronically Signed   By: Thornell Sartorius M.D.   On: 07/28/2022 02:41    Pending Labs Unresulted Labs (From admission, onward)     Start     Ordered   07/29/22 0500  Basic metabolic panel  Tomorrow morning,   R        07/28/22 0731   07/29/22 0500  CBC  Tomorrow morning,   R        07/28/22 0731   07/28/22 0556  Urine Culture  Once,   R        07/28/22 0556   07/28/22 0211  Culture, blood (Routine x 2)  BLOOD CULTURE X 2,   R (with STAT occurrences)      07/28/22 0210            Vitals/Pain Today's Vitals   07/28/22 0700 07/28/22 0730 07/28/22 0759 07/28/22 0800  BP: 117/74 127/76  123/72   Pulse: 69 70  71  Resp: 13 10  14   Temp:   97.9 F (36.6 C)   TempSrc:   Axillary   SpO2: 98% 98%  100%  Weight:      Height:      PainSc:        Isolation Precautions No active isolations  Medications Medications  ceFEPIme (MAXIPIME) 1 g in sodium chloride 0.9 % 100 mL IVPB (has no administration in time range)  furosemide (LASIX) injection 40 mg (has no administration in time range)  aspirin EC tablet 81 mg (has no administration in time range)  carvedilol (COREG) tablet 3.125 mg (has no administration in time range)  tamsulosin (FLOMAX) capsule 0.4 mg (has no administration in time range)  loratadine (CLARITIN) tablet 10 mg (has no administration in time range)  enoxaparin (LOVENOX) injection 40 mg (has no administration in time range)  acetaminophen (TYLENOL) tablet 650 mg (has no administration in time range)    Or  acetaminophen (TYLENOL) suppository 650 mg (has no administration in time range)  docusate sodium (COLACE) capsule 100 mg (has no administration in time range)  polyethylene glycol (MIRALAX / GLYCOLAX) packet 17 g (has no administration in time range)  traZODone (DESYREL) tablet 25 mg (has no administration in time range)  ondansetron (ZOFRAN) tablet 4 mg (has no administration in  time range)    Or  ondansetron (ZOFRAN) injection 4 mg (has no administration in time range)  albuterol (PROVENTIL) (2.5 MG/3ML) 0.083% nebulizer solution 2.5 mg (has no administration in time range)  acetaminophen (TYLENOL) tablet 1,000 mg (1,000 mg Oral Given 07/28/22 0256)  vancomycin (VANCOREADY) IVPB 1500 mg/300 mL (0 mg Intravenous Stopped 07/28/22 0613)  ceFEPIme (MAXIPIME) 2 g in sodium chloride 0.9 % 100 mL IVPB (0 g Intravenous Stopped 07/28/22 0344)  iohexol (OMNIPAQUE) 300 MG/ML solution 100 mL (100 mLs Intravenous Contrast Given 07/28/22 0403)  furosemide (LASIX) injection 40 mg (40 mg Intravenous Given 07/28/22 0443)    Mobility walks

## 2022-07-28 NOTE — H&P (Signed)
History and Physical  [name] RUE:[id number] DOB: [birth year] DOA: 07/28/2022  PCP: Caesar Bookman, NP   Chief Complaint: fever, sob   HPI: [name] is a 82 y.o. male with medical history significant for heart failure with preserved EF, coronary artery disease, aortic valve replacement recent hospital admission for hyponatremia being admitted to the hospital with fever, shortness of breath likely healthcare acquired pneumonia.  History is provided by the patient, who states that he was doing fine, afebrile, no cough, no chest pain, no shortness of breath until last night when he had sudden onset of fever, severe shortness of breath.  He had severe respiratory distress early this morning at his skilled nursing facility to which she was discharged after his recent hospital stay from 5/16 to 5/21.  Per EMS, patient was wheezing loudly, very tachypneic with retractions on their arrival, they gave him breathing treatments, IV Solu-Medrol.  ED Course: On arrival in the emergency department, he continued to require 8 L high flow nasal cannula to maintain oxygen saturations, he was tachycardic and febrile on arrival but otherwise hemodynamically stable.  After the workup listed below, he was given IV Lasix, empiric IV vancomycin, IV cefepime maintained on oxygen (currently on 5 L nasal cannula), and hospitalist contacted for admission.  Currently he is resting comfortably and has no complaints.  Review of Systems: Please see HPI for pertinent positives and negatives. A complete 10 system review of systems are otherwise negative.  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    HFrEF (heart failure with reduced ejection fraction) (HCC)    Hypertension    Moderate mitral regurgitation    Severe aortic stenosis    Past Surgical History:  Procedure  Laterality Date   AORTIC VALVE REPLACEMENT N/A 07/24/2017   Procedure: AORTIC VALVE REPLACEMENT (AVR) using 23mm Inspiris Aortic Valve;  Surgeon: Alleen Borne, MD;  Location: [facility] OR;  Service: Open Heart Surgery;  Laterality: N/A;   CLIPPING OF ATRIAL APPENDAGE N/A 07/24/2017   Procedure: CLIPPING OF ATRIAL APPENDAGE using a 50mm Atricure clip;  Surgeon: Alleen Borne, MD;  Location: [facility] OR;  Service: Open Heart Surgery;  Laterality: N/A;   CORONARY ARTERY BYPASS GRAFT N/A 07/24/2017   Procedure: CORONARY ARTERY BYPASS GRAFTING (CABG) times 3 using the left greater saphenous vein harvested endoscopically and left internal mammary artery.;  Surgeon: Alleen Borne, MD;  Location: [facility] OR;  Service: Open Heart Surgery;  Laterality: N/A;   EYE SURGERY Bilateral    lens replacements for cataracts   HERNIA REPAIR     INGUINAL HERNIA REPAIR Right 05/06/2015   Procedure: LAPAROSCOPIC RIGHT INGUINAL HERNIA WITH MESH;  Surgeon: Abigail Miyamoto, MD;  Location: [facility] ORS;  Service: General;  Laterality: Right;   INSERTION OF MESH Right 05/06/2015   Procedure: INSERTION OF MESH;  Surgeon: Abigail Miyamoto, MD;  Location: [facility] ORS;  Service: General;  Laterality: Right;   RIGHT/LEFT HEART CATH AND CORONARY ANGIOGRAPHY N/A 07/10/2017   Procedure: RIGHT/LEFT HEART CATH AND CORONARY ANGIOGRAPHY;  Surgeon: Elder Negus, MD;  Location: [facility] INVASIVE CV LAB;  Service: Cardiovascular;  Laterality: N/A;   TEE WITHOUT CARDIOVERSION N/A 07/11/2017   Procedure: TRANSESOPHAGEAL ECHOCARDIOGRAM (TEE);  Surgeon: Elder Negus, MD;  Location: [facility] ENDOSCOPY;  Service: Cardiovascular;  Laterality: N/A;  TEE WITHOUT CARDIOVERSION N/A 07/24/2017   Procedure: TRANSESOPHAGEAL ECHOCARDIOGRAM (TEE);  Surgeon: Alleen Borne, MD;  Location: [facility] OR;  Service: Open Heart Surgery;  Laterality: N/A;   TOTAL KNEE ARTHROPLASTY Right 08/07/2019   Procedure: TOTAL KNEE ARTHROPLASTY;  Surgeon: Ollen Gross, MD;  Location: [facility] ORS;  Service:  Orthopedics;  Laterality: Right;   ULTRASOUND GUIDANCE FOR VASCULAR ACCESS  07/10/2017   Procedure: Ultrasound Guidance For Vascular Access;  Surgeon: Elder Negus, MD;  Location: [facility] INVASIVE CV LAB;  Service: Cardiovascular;;    Social History:  reports that he quit smoking about 51 years ago. His smoking use included cigarettes. He has a 12.00 pack-year smoking history. He has never used smokeless tobacco. He reports current alcohol use. He reports current drug use. Drug: Marijuana.   Allergies  Allergen Reactions   Sulfamethoxazole-Trimethoprim Rash    Family History  Problem Relation Age of Onset   Stroke Mother    Heart attack Father      Prior to Admission medications   Medication Sig Start Date End Date Taking? Authorizing Provider  acetaminophen (TYLENOL) 500 MG tablet Take 2 tablets (1,000 mg total) by mouth every 6 (six) hours as needed for mild pain or fever. 07/29/17   Barrett, Erin R, PA-C  ascorbic acid (VITAMIN C) 500 MG tablet Take 1 tablet (500 mg total) by mouth daily. 03/25/22   Ngetich, Dinah C, NP  aspirin EC 81 MG tablet Take 81 mg by mouth daily. Swallow whole.    [provider]  carvedilol (COREG) 3.125 MG tablet Take 1 tablet (3.125 mg total) by mouth 2 (two) times daily. 06/28/22 06/28/23  Leroy Sea, MD  Cetirizine HCl (ZYRTEC ALLERGY) 10 MG CAPS Take 10 mg by mouth daily as needed (as needed for allergies). 05/09/22   [provider]  diclofenac Sodium (VOLTAREN) 1 % GEL Apply 2 g topically 4 (four) times daily. To low back 06/28/22   Leroy Sea, MD  lactulose (CEPHULAC) 20 g packet Take 1 packet (20 g total) by mouth 3 (three) times daily as needed (constipation). 06/28/22   Leroy Sea, MD  lidocaine (LIDODERM) 5 % Place 1 patch onto the skin daily. Remove & Discard patch within 12 hours or as directed by MD 06/28/22   Leroy Sea, MD  Multiple Vitamin (MULTIVITAMIN ADULT PO) Take 1 tablet by mouth daily.     [provider]  Omega-3 Fatty Acids (FISH OIL) 1000 MG CAPS Take 2 capsules by mouth daily.    [provider]  silodosin (RAPAFLO) 8 MG CAPS capsule Take 8 mg by mouth daily. 11/15/21   [provider]    Physical Exam: BP 117/74   Pulse 69   Temp 98.6 F (37 C) (Oral)   Resp 13   Ht 5\' 11"  (1.803 m)   Wt 74.8 kg   SpO2 98%   BMI 23.01 kg/m   General:  Alert, oriented, calm, in no acute distress, wearing 5 L nasal cannula oxygen, speaking in full sentences, no cough, no complaint of shortness of breath. Eyes: EOMI, clear conjuctivae, white sclerea Neck: supple, no masses, trachea mildline  Cardiovascular: RRR, no murmurs or rubs, no peripheral edema  Respiratory: Clear to auscultation bilaterally, with reduced breath sounds at the right middle and lower lung zone, some mild crackles diffusely, no wheezing, no tachypnea, retractions, stridor or other evidence of respiratory distress Abdomen: soft, nontender, nondistended, normal bowel tones heard  Skin: dry, no rashes  Musculoskeletal:  no joint effusions, normal range of motion  Psychiatric: appropriate affect, normal speech  Neurologic: extraocular muscles intact, clear speech, moving all extremities with intact sensorium          Labs on Admission:  Basic Metabolic Panel: Recent Labs  Lab 07/28/22 0209  NA 129*  K 4.1  CL 97*  CO2 23  GLUCOSE 145*  BUN 29*  CREATININE 0.84  CALCIUM 8.2*   Liver Function Tests: Recent Labs  Lab 07/28/22 0209  AST 28  ALT 32  ALKPHOS 64  BILITOT 1.0  PROT 5.9*  ALBUMIN 2.6*   No results for input(s): "LIPASE", "AMYLASE" in the last 168 hours. No results for input(s): "AMMONIA" in the last 168 hours. CBC: Recent Labs  Lab 07/28/22 0209  WBC 10.5  NEUTROABS 9.5*  HGB 9.1*  HCT 27.6*  MCV 93.6  PLT 121*   Cardiac Enzymes: No results for input(s): "CKTOTAL", "CKMB", "CKMBINDEX", "TROPONINI" in the last 168 hours.  BNP (last 3  results) Recent Labs    06/27/22 0436 06/28/22 0529 07/28/22 0209  BNP 514.4* 389.7* 1,615.2*    ProBNP (last 3 results) No results for input(s): "PROBNP" in the last 8760 hours.  CBG: No results for input(s): "GLUCAP" in the last 168 hours.  Radiological Exams on Admission: CT CHEST ABDOMEN PELVIS W CONTRAST  Result Date: 07/28/2022 CLINICAL DATA:  Sepsis. Shortness of breath, elevated white count, CHF, hepatitis. EXAM: CT CHEST, ABDOMEN, AND PELVIS WITH CONTRAST TECHNIQUE: Multidetector CT imaging of the chest, abdomen and pelvis was performed following the standard protocol during bolus administration of intravenous contrast. RADIATION DOSE REDUCTION: This exam was performed according to the departmental dose-optimization program which includes automated exposure control, adjustment of the mA and/or kV according to patient size and/or use of iterative reconstruction technique. CONTRAST:  OMNIPAQUE IOHEXOL 300 MG/ML  SOLN COMPARISON:  06/24/2022. FINDINGS: CT CHEST FINDINGS Cardiovascular: The heart is normal in size and there is no pericardial effusion. Multi-vessel coronary artery calcifications are noted. There is atherosclerotic calcification of the aorta without evidence of aneurysm. The pulmonary trunk is normal in caliber. Mediastinum/Nodes: Enlarged lymph nodes are present in the mediastinum measuring up to 1.5 cm in the precarinal space. No hilar or axillary lymphadenopathy. A subcentimeter hypodensity is noted in the left lobe of the thyroid gland. No additional imaging is recommended. The trachea and esophagus are within normal limits. There is a small hiatal hernia. Lungs/Pleura: There are small bilateral pleural effusions bilaterally with compressive atelectasis. Diffuse hazy airspace opacities are present in the lungs bilaterally, greater on the right than on the left. No pneumothorax. Musculoskeletal: Sternotomy wires are noted. Degenerative changes are present in the  thoracic spine. No acute osseous abnormality is seen. CT ABDOMEN PELVIS FINDINGS Hepatobiliary: Cyst is noted in the left lobe of the liver. A stable hypodensity is noted in the posterior right lobe of the liver measuring 4.5 cm. The gallbladder is without stones. No biliary ductal dilatation is seen. Pancreas: Unremarkable. No pancreatic ductal dilatation or surrounding inflammatory changes. Spleen: Normal in size without focal abnormality. Adrenals/Urinary Tract: The adrenal glands are within normal limits. The kidneys enhance symmetrically. There is a cyst in the lower pole of the left kidney. A slightly complex hypodensity is noted in the lower pole of the right kidney measuring 2 cm. No renal calculus or hydronephrosis. The bladder is unremarkable. Stomach/Bowel: There is a small hiatal hernia. Stomach is within normal limits. Appendix appears normal. No evidence of bowel wall thickening, distention, or  inflammatory changes. No free air or pneumatosis. Scattered diverticula are present along the colon without evidence of diverticulitis. Vascular/Lymphatic: Aortic atherosclerosis. No enlarged abdominal or pelvic lymph nodes. Reproductive: Prostate is unremarkable. Other: Small amount free fluid in the pelvis. Musculoskeletal: Degenerative changes are present in the lumbar spine. No acute fracture is seen. IMPRESSION: 1. Hazy diffuse ground-glass opacities in the lungs bilaterally, greater on the right than on the left, possible edema versus multifocal pneumonia. 2. Small bilateral pleural effusions with compressive atelectasis. 3. Stable indeterminate hypodensity in the posterior right lobe of the liver measuring 4.5 cm. MRI with contrast is recommended for further evaluation on nonemergent follow-up given history of hepatitis. 4. Small ascites in the pelvis. 5. Complex cyst in the mid right kidney measuring 2 cm. Lesion may also be better evaluated on MRI. 6. Small hiatal hernia. 7. Aortic atherosclerosis and  coronary artery calcifications. Electronically Signed   By: Thornell Sartorius M.D.   On: 07/28/2022 04:42   DG Chest Port 1 View  Result Date: 07/28/2022 CLINICAL DATA:  Sepsis.  Fever and shortness of breath. EXAM: PORTABLE CHEST 1 VIEW COMPARISON:  06/26/2022. FINDINGS: Heart is enlarged in the mediastinal contour is within normal limits. A prosthetic cardiac valve and left atrial appendage clip are noted. There is atherosclerotic calcification of the aorta. The pulmonary vasculature is distended. Diffuse hazy airspace opacities are noted in the lungs bilaterally, greater on the right than on the left. There are small bilateral pleural effusions. No pneumothorax. Sternotomy wires are noted. No acute osseous abnormality. IMPRESSION: 1. Cardiomegaly with pulmonary vascular congestion. 2. Diffuse airspace opacities bilaterally, possible edema or infiltrate. 3. Small bilateral pleural effusions. Electronically Signed   By: Thornell Sartorius M.D.   On: 07/28/2022 02:41    Assessment/Plan This is a pleasant 82 year old gentleman with a history of coronary artery disease status post CABG, CHF with preserved EF, hypertension, bioprosthetic aortic valve, presents to the hospital with fever and shortness of breath concerning for sepsis from healthcare acquired pneumonia.  Sepsis-meeting criteria with tachycardia, fever, healthcare acquired pneumonia as suspected source.  Lactate is normal, he is hemodynamically stable. -Inpatient admission to stepdown unit -Follow blood cultures -Note negative respiratory viral panel -Empiric IV cefepime and IV vancomycin  HCAP-with recent hospital admission in May 2024, now presents with fever, acute hypoxia, groundglass opacities on chest CT. Treating as above.  Acute hypoxic respiratory failure-likely due to healthcare acquired pneumonia, with contribution from possible acute diastolic congestive heart failure. -Treat HCAP as above -Wean oxygen as able -Will continue gentle  Lasix diuresis, with a close eye on his renal function  Suspected acute on chronic diastolic congestive heart failure-the setting of small bilateral pleural effusions, elevated BNP, with history of recovered EF.  He has evidence of grade 2 diastolic dysfunction on his last echo February 2023. -Continue aspirin, Coreg -Update 2D echo -Continue gentle Lasix diuresis, with close eye on his renal function  Urinary retention-Flomax  Stable liver hypodensity, and complex renal cyst-recommend outpatient MRI for these  DVT prophylaxis: Lovenox     Code Status: Full Code  Consults called: None  Admission status: The appropriate patient status for this patient is INPATIENT. Inpatient status is judged to be reasonable and necessary in order to provide the required intensity of service to ensure the patient's safety. The patient's presenting symptoms, physical exam findings, and initial radiographic and laboratory data in the context of their chronic comorbidities is felt to place them at high risk for further clinical deterioration. Furthermore, it  is not anticipated that the patient will be medically stable for discharge from the hospital within 2 midnights of admission.    I certify that at the point of admission it is my clinical judgment that the patient will require inpatient hospital care spanning beyond 2 midnights from the point of admission due to high intensity of service, high risk for further deterioration and high frequency of surveillance required  Time spent: 62 minutes  Kyonna Frier Sharlette Dense MD [facility] Pager [phone]  If 7PM-7AM, please contact night-coverage [email or web address] Password [facility]  07/28/2022, 7:39 AM

## 2022-07-29 DIAGNOSIS — I38 Endocarditis, valve unspecified: Secondary | ICD-10-CM | POA: Diagnosis not present

## 2022-07-29 DIAGNOSIS — I509 Heart failure, unspecified: Secondary | ICD-10-CM

## 2022-07-29 DIAGNOSIS — J9601 Acute respiratory failure with hypoxia: Secondary | ICD-10-CM | POA: Diagnosis not present

## 2022-07-29 DIAGNOSIS — I700 Atherosclerosis of aorta: Secondary | ICD-10-CM | POA: Insufficient documentation

## 2022-07-29 DIAGNOSIS — R6520 Severe sepsis without septic shock: Secondary | ICD-10-CM

## 2022-07-29 DIAGNOSIS — A409 Streptococcal sepsis, unspecified: Secondary | ICD-10-CM | POA: Diagnosis not present

## 2022-07-29 DIAGNOSIS — J189 Pneumonia, unspecified organism: Secondary | ICD-10-CM | POA: Insufficient documentation

## 2022-07-29 DIAGNOSIS — B955 Unspecified streptococcus as the cause of diseases classified elsewhere: Secondary | ICD-10-CM | POA: Insufficient documentation

## 2022-07-29 DIAGNOSIS — T826XXA Infection and inflammatory reaction due to cardiac valve prosthesis, initial encounter: Secondary | ICD-10-CM | POA: Diagnosis not present

## 2022-07-29 DIAGNOSIS — R7881 Bacteremia: Secondary | ICD-10-CM | POA: Diagnosis not present

## 2022-07-29 DIAGNOSIS — I5033 Acute on chronic diastolic (congestive) heart failure: Secondary | ICD-10-CM | POA: Insufficient documentation

## 2022-07-29 DIAGNOSIS — Z79899 Other long term (current) drug therapy: Secondary | ICD-10-CM

## 2022-07-29 DIAGNOSIS — A419 Sepsis, unspecified organism: Secondary | ICD-10-CM | POA: Insufficient documentation

## 2022-07-29 LAB — CBC
HCT: 26.2 % — ABNORMAL LOW (ref 39.0–52.0)
Hemoglobin: 8.6 g/dL — ABNORMAL LOW (ref 13.0–17.0)
MCH: 30.5 pg (ref 26.0–34.0)
MCHC: 32.8 g/dL (ref 30.0–36.0)
MCV: 92.9 fL (ref 80.0–100.0)
Platelets: 124 10*3/uL — ABNORMAL LOW (ref 150–400)
RBC: 2.82 MIL/uL — ABNORMAL LOW (ref 4.22–5.81)
RDW: 13.4 % (ref 11.5–15.5)
WBC: 12 10*3/uL — ABNORMAL HIGH (ref 4.0–10.5)
nRBC: 0 % (ref 0.0–0.2)

## 2022-07-29 LAB — URINE CULTURE: Culture: <10000 — AB

## 2022-07-29 LAB — BLOOD CULTURE ID PANEL (REFLEXED) - BCID2

## 2022-07-29 LAB — BASIC METABOLIC PANEL
Anion gap: 7 (ref 5–15)
BUN: 33 mg/dL — ABNORMAL HIGH (ref 8–23)
CO2: 24 mmol/L (ref 22–32)
Calcium: 8.3 mg/dL — ABNORMAL LOW (ref 8.9–10.3)
Chloride: 98 mmol/L (ref 98–111)
Creatinine, Ser: 0.83 mg/dL (ref 0.61–1.24)
GFR, Estimated: >60 mL/min (ref >60)
Glucose, Bld: 133 mg/dL — ABNORMAL HIGH (ref 70–99)
Potassium: 4 mmol/L (ref 3.5–5.1)
Sodium: 129 mmol/L — ABNORMAL LOW (ref 135–145)

## 2022-07-29 LAB — CULTURE, BLOOD (ROUTINE X 2)

## 2022-07-29 MED ORDER — SODIUM CHLORIDE 0.9 % IV SOLN
2.0000 g | INTRAVENOUS | Status: DC
  Administered 2022-07-29 – 2022-08-03 (×6): 2 g via INTRAVENOUS
  Filled 2022-07-29 (×6): qty 20

## 2022-07-29 MED ORDER — MUPIROCIN 2 % EX OINT
1.0000 | TOPICAL_OINTMENT | Freq: Two times a day (BID) | CUTANEOUS | Status: AC
  Administered 2022-07-29 – 2022-08-02 (×10): 1 via NASAL
  Filled 2022-07-29 (×3): qty 22

## 2022-07-29 MED ORDER — MIRABEGRON ER 25 MG PO TB24
25.0000 mg | ORAL_TABLET | Freq: Every day | ORAL | Status: DC
  Administered 2022-07-29 – 2022-08-03 (×6): 25 mg via ORAL
  Filled 2022-07-29 (×6): qty 1

## 2022-07-29 MED ORDER — LIDOCAINE 5 % EX PTCH
1.0000 | MEDICATED_PATCH | CUTANEOUS | Status: DC
  Administered 2022-07-30 – 2022-08-03 (×4): 1 via TRANSDERMAL
  Filled 2022-07-29 (×6): qty 1

## 2022-07-29 NOTE — Progress Notes (Signed)
  Progress Note   Patient: [name] ZOX:[id number] DOB: 10/06/1940 DOA: 07/28/2022     1 DOS: the patient was seen and examined on 07/29/2022   Brief hospital course: 82 year old man from independent living, PMH including diastolic CHF, aortic valve replacement, recent admission for hyponatremia, presented with fever and respiratory distress requiring 8 L high flow nasal cannula, noted to be tachycardic and febrile.  Admitted for sepsis secondary to multifocal pneumonia, acute on chronic diastolic CHF.  Subsequently found to be bacteremic.  Consultants ID  Procedures None  Assessment and Plan: Sepsis secondary to Streptococcal bacteremia secondary to Multifocal pneumonia with associated acute hypoxic respiratory failure with SpO2 79% per chart Sepsis pathophysiology appears to have resolved.  Hypoxia has resolved. Narrow antibiotics to ceftriaxone per pharmacy Follow-up echocardiogram given history of aortic valve replacement ID consultation for bacteremia Can transfer to medical floor   Acute on chronic diastolic congestive heart failure. Elevated BNP on admission.  Echocardiogram February 2023 showed grade 2 diastolic dysfunction.  Appears euvolemic.  Follow-up echocardiogram.  Not on diuretics as outpatient.  Monitor volume status. Continue aspirin, carvedilol   Stable liver hypodensity, and complex renal cyst recommend outpatient MRI for these  Aortic atherosclerosis and coronary artery calcifications. Resume statin.     Subjective:  Feels better, breathing better, overall much better Walked today  Physical Exam: Vitals:   07/29/22 0800 07/29/22 0835 07/29/22 0900 07/29/22 1000  BP: 137/64  133/66 131/68  Pulse: 76  86 79  Resp: (!) 22  (!) 25 (!) 21  Temp:  98.1 F (36.7 C)    TempSrc:      SpO2: 93%  95% 96%  Weight:      Height:       Physical Exam Vitals reviewed.  Constitutional:      General: He is not in acute distress.    Appearance: He is not  ill-appearing or toxic-appearing.  Cardiovascular:     Rate and Rhythm: Normal rate and regular rhythm.     Heart sounds: No murmur heard. Pulmonary:     Effort: Pulmonary effort is normal. No respiratory distress.     Breath sounds: No wheezing, rhonchi or rales.  Neurological:     Mental Status: He is alert.  Psychiatric:        Mood and Affect: Mood normal.        Behavior: Behavior normal.     Data Reviewed: Na+ 129 > 129 BNP on admission 1615 Hgb 9.1 > 8.6 Plts 121 > 124 CT chest hazy opacities, edema vs multifocal pneumonia  Family Communication: son, friend at bedside  Disposition: Status is: Inpatient Remains inpatient appropriate because: bacteremia  Planned Discharge Destination: Home    Time spent: 35 minutes  Author: Brendia Sacks, MD 07/29/2022 11:13 AM  For on call review [email or web address].

## 2022-07-29 NOTE — Consult Note (Signed)
[facility]                                                                                        Patient Identification: Patient Name: [name] MRN: [id number] Admit Date: 07/28/2022  2:09 AM Today's Date: 07/29/2022 Reason for consult: bacteremia  Requesting provider: Dr Irene Limbo  Principal Problem:   Sepsis [facility]) Active Problems:   S/P AVR   Acute hypoxic respiratory failure (HCC)   Streptococcal bacteremia   Multifocal pneumonia   Acute on chronic diastolic CHF (congestive heart failure) (HCC)   Aortic atherosclerosis (HCC)   Antibiotics:  Vancomycin 6/19-c Cefepime 6/19-c  Lines/Hardware: rt TKA, hernia mesh  Assessment 82 year old male with multiple co-morbidities including CAD, CHF, moderate MR, severe aortic stenosis s/p CABG, clipping of atrial appendage and prosthetic aortic valve replacement in 07/24/2017 admitted with acute onset SOB, fevers. Found to have   # High grade Strep sanguinis bacteremia R/o endocarditis, unclear source Oral exam is clean  CT Hazy diffuse ground-glass opacities in the lungs bilaterally, greater on the right than on the left, possible edema versus multifocal pneumonia. Small bilateral pleural effusions with compressive atelectasis.  # Acute on chronic CHF Elevated BNP  TTE pending  IV diuretics per primary   Recommendations  Will change abtx to IV ceftriaxone only  Repeat 2 sets of blood cx ordered for tomorrow  Needs TEE, fu TTE No concerns at back whatsoever ( prior back spasms has resolved) and defer imaging  Monitor CBC and CMP Will see back on Monday, please call with questions over the weekend.   Rest of the management as per the primary team. Please call with questions or concerns.  Thank you for the consult  __________________________________________________________________________________________________________ HPI and  Hospital Course: 82 year old male with PMH of CAD, CHF, gout, hepatitis A, hypertension, moderate MR, severe aortic stenosis s/p CABG, clipping of atrial appendage and prosthetic aortic valve replacement in 07/24/2017, right inguinal hernia mesh repair in May 06 2015, Rt TKA who presented to the ED from [place] [facility] living on 6/20 with sudden onset severe respiratory distress, fevers, SpO2 79% on room air. Denied nausea, vomiting, abdominal pain, diarrhea, GU symptoms, headache or other systemic symptoms.  Recently discharged on 5/21 when admitted with spasms in back pain /hyponatremia. MRI T L spine with degenerative changes which improved with steroids.   At ED febrile, tachypneic, tachycardic, wheezing Patient was given 125 mg IV Solu-Medrol, 10 Mg albuterol nebulizer and 1 mg Atrovent nebulizer and route to the ED via EMS, requiring 8 L high flow nasal cannula to maintain oxygen saturation Labs remarkable for Na 129 BNP elevated to 1615, lactic acid 1.3, wbc 10.5, platelets 121 Code sepsis initiated with broad-spectrum antibiotics ( Vancomycin and cefepime) as well as IV Lasix Imagings as below  ID consulted for strep sanguinis bacteremia   Quit smoking, marijuana as well as alcohol. Reports living a very physically active life  Back spasms have resolved and no concerns whatsoever in the back. Not on any antibiotics for back  Denies any back pain, neck pain as well pain in other peripheral joints and rt TKA Reports  his teeth is in good condition, no recent dental cleaning  ROS: General- Denies  loss of appetite and loss of weight HEENT - Denies headache, blurry vision, neck pain, sinus pain Chest - Denies any chest pain, cough CVS- Denies any dizziness/lightheadedness, syncopal attacks, palpitations Abdomen- Denies any nausea, vomiting, abdominal pain, hematochezia and diarrhea Neuro - Denies any weakness, numbness, tingling sensation Psych - Denies any changes in mood  irritability or depressive symptoms GU- Denies any burning, dysuria, hematuria or increased frequency of urination Skin - denies any rashes/lesions MSK - denies any joint pain/swelling or restricted ROM    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    HFrEF (heart failure with reduced ejection fraction) (HCC)    Hypertension    Moderate mitral regurgitation    Severe aortic stenosis    Past Surgical History:  Procedure Laterality Date   AORTIC VALVE REPLACEMENT N/A 07/24/2017   Procedure: AORTIC VALVE REPLACEMENT (AVR) using 23mm Inspiris Aortic Valve;  Surgeon: Alleen Borne, MD;  Location: [facility] OR;  Service: Open Heart Surgery;  Laterality: N/A;   CLIPPING OF ATRIAL APPENDAGE N/A 07/24/2017   Procedure: CLIPPING OF ATRIAL APPENDAGE using a 50mm Atricure clip;  Surgeon: Alleen Borne, MD;  Location: [facility] OR;  Service: Open Heart Surgery;  Laterality: N/A;   CORONARY ARTERY BYPASS GRAFT N/A 07/24/2017   Procedure: CORONARY ARTERY BYPASS GRAFTING (CABG) times 3 using the left greater saphenous vein harvested endoscopically and left internal mammary artery.;  Surgeon: Alleen Borne, MD;  Location: [facility] OR;  Service: Open Heart Surgery;  Laterality: N/A;   EYE SURGERY Bilateral    lens replacements for cataracts   HERNIA REPAIR     INGUINAL HERNIA REPAIR Right 05/06/2015   Procedure: LAPAROSCOPIC RIGHT INGUINAL HERNIA WITH MESH;  Surgeon: Abigail Miyamoto, MD;  Location: [facility] ORS;  Service: General;  Laterality: Right;   INSERTION OF MESH Right 05/06/2015   Procedure: INSERTION OF MESH;  Surgeon: Abigail Miyamoto, MD;  Location: [facility] ORS;  Service: General;  Laterality: Right;   RIGHT/LEFT HEART CATH AND CORONARY ANGIOGRAPHY N/A 07/10/2017   Procedure: RIGHT/LEFT HEART CATH AND CORONARY ANGIOGRAPHY;  Surgeon: Elder Negus, MD;  Location: [facility] INVASIVE CV  LAB;  Service: Cardiovascular;  Laterality: N/A;   TEE WITHOUT CARDIOVERSION N/A 07/11/2017   Procedure: TRANSESOPHAGEAL ECHOCARDIOGRAM (TEE);  Surgeon: Elder Negus, MD;  [facility] ENDOSCOPY;  Service: Cardiovascular;  Laterality: N/A;   TEE WITHOUT CARDIOVERSION N/A 07/24/2017   Procedure: TRANSESOPHAGEAL ECHOCARDIOGRAM (TEE);  Surgeon: Alleen Borne, MD;  Location: [facility] OR;  Service: Open Heart Surgery;  Laterality: N/A;   TOTAL KNEE ARTHROPLASTY Right 08/07/2019   Procedure: TOTAL KNEE ARTHROPLASTY;  Surgeon: Ollen Gross, MD;  Location: [facility] ORS;  Service: Orthopedics;  Laterality: Right;   ULTRASOUND GUIDANCE FOR VASCULAR ACCESS  07/10/2017   Procedure: Ultrasound Guidance For Vascular Access;  Surgeon: Elder Negus, MD;  Location: [facility] INVASIVE CV LAB;  Service: Cardiovascular;;      Scheduled Meds:  aspirin EC  81 mg Oral Daily   carvedilol  3.125 mg Oral BID   Chlorhexidine Gluconate Cloth  6 each Topical Daily   docusate sodium  100 mg Oral BID   enoxaparin (LOVENOX) injection  40 mg Subcutaneous Q24H   lidocaine  1  patch Transdermal Q24H   loratadine  10 mg Oral Daily   mirabegron ER  25 mg Oral Daily   mupirocin ointment  1 Application Nasal BID   mouth rinse  15 mL Mouth Rinse 4 times per day   tamsulosin  0.4 mg Oral QPC supper   Continuous Infusions:  cefTRIAXone (ROCEPHIN)  IV     PRN Meds:.acetaminophen **OR** acetaminophen, albuterol, ondansetron **OR** ondansetron (ZOFRAN) IV, mouth rinse, polyethylene glycol, traZODone  Allergies  Allergen Reactions   Sulfamethoxazole-Trimethoprim Rash   Social History   Socioeconomic History   Marital status: Married    Spouse name: Not on file   Number of children: 1   Years of education: Not on file   Highest education level: Not on file  Occupational History   Not on file  Tobacco Use   Smoking status: Former    Packs/day: 1.00    Years: 12.00    Additional pack years: 0.00    Total pack years:  12.00    Types: Cigarettes    Quit date: 87    Years since quitting: 51.5   Smokeless tobacco: Never   Tobacco comments:    quit 30-40 yrs ago  Vaping Use   Vaping Use: Never used  Substance and Sexual Activity   Alcohol use: Yes    Comment: 1-2 drinks   Drug use: Yes    Types: Marijuana   Sexual activity: Not Currently  Other Topics Concern   Not on file  Social History Narrative   Not on file   Social Determinants of Health   Financial Resource Strain: Low Risk  (05/13/2022)   Overall Financial Resource Strain (CARDIA)    Difficulty of Paying Living Expenses: Not hard at all  Food Insecurity: No Food Insecurity (05/13/2022)   Hunger Vital Sign    Worried About Running Out of Food in the Last Year: Never true    Ran Out of Food in the Last Year: Never true  Transportation Needs: No Transportation Needs (05/13/2022)   PRAPARE - Administrator, Civil Service (Medical): No    Lack of Transportation (Non-Medical): No  Physical Activity: Sufficiently Active (05/13/2022)   Exercise Vital Sign    Days of Exercise per Week: 7 days    Minutes of Exercise per Session: 60 min  Stress: No Stress Concern Present (05/13/2022)   [employer] of Occupational Health - Occupational Stress Questionnaire    Feeling of Stress : Not at all  Social Connections: Socially Integrated (05/13/2022)   Social Connection and Isolation Panel [NHANES]    Frequency of Communication with Friends and Family: More than three times a week    Frequency of Social Gatherings with Friends and Family: More than three times a week    Attends Religious Services: More than 4 times per year    Active Member of [employer] or Organizations: Yes    Attends Engineer, structural: More than 4 times per year    Marital Status: Married  Catering manager Violence: Not At Risk (05/13/2022)   Humiliation, Afraid, Rape, and Kick questionnaire    Fear of Current or Ex-Partner: No    Emotionally Abused: No     Physically Abused: No    Sexually Abused: No   Family History  Problem Relation Age of Onset   Stroke Mother    Heart attack Father    Vitals BP 131/68   Pulse 79   Temp 98.1 F (36.7 C)   Resp (!)  21   Ht 5\' 11"  (1.803 m)   Wt 73.6 kg   SpO2 96%   BMI 22.63 kg/m    Physical Exam Constitutional:  adult male sitting in the recliner and appears comfortable     Comments:   Cardiovascular:     Rate and Rhythm: Normal rate and regular rhythm.     Heart sounds: s1s2  Pulmonary:     Effort: Pulmonary effort is normal on room air     Comments: bilateral crepts  Abdominal:     Palpations: Abdomen is soft.     Tenderness: non distended and non tender   Musculoskeletal:        General: No swelling or tenderness in peripheral joints or signs of septic peripheral joint/TKA  Skin:    Comments: No rashes   Neurological:     General: awake, alert and oriented following command. No spinal tenderness CTL  Psychiatric:        Mood and Affect: Mood normal.    Pertinent Microbiology Results for orders placed or performed during the hospital encounter of 07/28/22  Culture, blood (Routine x 2)     Status: Abnormal (Preliminary result)   Collection Time: 07/28/22  2:12 AM   Specimen: BLOOD  Result Value Ref Range Status   Specimen Description   Final    BLOOD RIGHT ANTECUBITAL Performed at [facility], [address] [place], [place] [address]    Special Requests   Final    BOTTLES DRAWN AEROBIC AND ANAEROBIC Blood Culture adequate volume Performed at [facility], [address] [place], [place] [address]    Culture  Setup Time   Final    GRAM POSITIVE COCCI IN BOTH AEROBIC AND ANAEROBIC BOTTLES CRITICAL VALUE NOTED.  VALUE IS CONSISTENT WITH PREVIOUSLY REPORTED AND CALLED VALUE. Performed at [facility], [address] [place], [place] [address]    Culture STREPTOCOCCUS SANGUINIS (A)  Final   Report Status PENDING   Incomplete  Culture, blood (Routine x 2)     Status: Abnormal (Preliminary result)   Collection Time: 07/28/22  3:04 AM   Specimen: BLOOD LEFT FOREARM  Result Value Ref Range Status   Specimen Description   Final    BLOOD LEFT FOREARM Performed at [facility], [address], [place], [place] [address]    Special Requests   Final    BOTTLES DRAWN AEROBIC AND ANAEROBIC Blood Culture adequate volume Performed at [facility], [address] [place], [place] [address]    Culture  Setup Time   Final    GRAM POSITIVE COCCI IN CHAINS IN BOTH AEROBIC AND ANAEROBIC BOTTLES CRITICAL RESULT CALLED TO, READ BACK BY AND VERIFIED WITH: L POINDEXTER,[email or web address]  07/29/22 MK    Culture (A)  Final    STREPTOCOCCUS SANGUINIS SUSCEPTIBILITIES TO FOLLOW Performed at [facility], [address] [place], [place] [address]    Report Status PENDING  Incomplete  Blood Culture ID Panel (Reflexed)     Status: Abnormal   Collection Time: 07/28/22  3:04 AM  Result Value Ref Range Status   Enterococcus faecalis NOT DETECTED NOT DETECTED Final   Enterococcus Faecium NOT DETECTED NOT DETECTED Final   Listeria monocytogenes NOT DETECTED NOT DETECTED Final   Staphylococcus species NOT DETECTED NOT DETECTED Final   Staphylococcus aureus (BCID) NOT DETECTED NOT DETECTED Final   Staphylococcus epidermidis NOT DETECTED NOT DETECTED Final   Staphylococcus lugdunensis NOT DETECTED NOT DETECTED Final  Streptococcus species DETECTED (A) NOT DETECTED Final    Comment: Not Enterococcus species, Streptococcus agalactiae, Streptococcus pyogenes, or Streptococcus pneumoniae. CRITICAL RESULT CALLED TO, READ BACK BY AND VERIFIED WITH: L POINDEXTER,[email or web address]  07/29/22 MK    Streptococcus agalactiae NOT DETECTED NOT DETECTED Final   Streptococcus pneumoniae NOT DETECTED NOT DETECTED Final   Streptococcus pyogenes NOT DETECTED NOT DETECTED Final   A.calcoaceticus-baumannii NOT DETECTED NOT  DETECTED Final   Bacteroides fragilis NOT DETECTED NOT DETECTED Final   Enterobacterales NOT DETECTED NOT DETECTED Final   Enterobacter cloacae complex NOT DETECTED NOT DETECTED Final   Escherichia coli NOT DETECTED NOT DETECTED Final   Klebsiella aerogenes NOT DETECTED NOT DETECTED Final   Klebsiella oxytoca NOT DETECTED NOT DETECTED Final   Klebsiella pneumoniae NOT DETECTED NOT DETECTED Final   Proteus species NOT DETECTED NOT DETECTED Final   Salmonella species NOT DETECTED NOT DETECTED Final   Serratia marcescens NOT DETECTED NOT DETECTED Final   Haemophilus influenzae NOT DETECTED NOT DETECTED Final   Neisseria meningitidis NOT DETECTED NOT DETECTED Final   Pseudomonas aeruginosa NOT DETECTED NOT DETECTED Final   Stenotrophomonas maltophilia NOT DETECTED NOT DETECTED Final   Candida albicans NOT DETECTED NOT DETECTED Final   Candida auris NOT DETECTED NOT DETECTED Final   Candida glabrata NOT DETECTED NOT DETECTED Final   Candida krusei NOT DETECTED NOT DETECTED Final   Candida parapsilosis NOT DETECTED NOT DETECTED Final   Candida tropicalis NOT DETECTED NOT DETECTED Final   Cryptococcus neoformans/gattii NOT DETECTED NOT DETECTED Final    Comment: Performed at [facility], [address], [place], [place] [address]  Resp panel by RT-PCR (RSV, Flu A&B, Covid) Peripheral     Status: None   Collection Time: 07/28/22  3:07 AM   Specimen: Peripheral; Nasal Swab  Result Value Ref Range Status   SARS Coronavirus 2 by RT PCR NEGATIVE NEGATIVE Final    Comment: (NOTE) SARS-CoV-2 target nucleic acids are NOT DETECTED.  The SARS-CoV-2 RNA is generally detectable in upper respiratory specimens during the acute phase of infection. The lowest concentration of SARS-CoV-2 viral copies this assay can detect is 138 copies/mL. A negative result does not preclude SARS-Cov-2 infection and should not be used as the sole basis for treatment or other patient management decisions. A  negative result may occur with  improper specimen collection/handling, submission of specimen other than nasopharyngeal swab, presence of viral mutation(s) within the areas targeted by this assay, and inadequate number of viral copies(<138 copies/mL). A negative result must be combined with clinical observations, patient history, and epidemiological information. The expected result is Negative.  Fact Sheet for Patients:  BloggerCourse.com  Fact Sheet for Healthcare Providers:  SeriousBroker.it  This test is no t yet approved or cleared by the [place] FDA and  has been authorized for detection and/or diagnosis of SARS-CoV-2 by FDA under an Emergency Use Authorization (EUA). This EUA will remain  in effect (meaning this test can be used) for the duration of the COVID-19 declaration under Section 564(b)(1) of the Act, 21 U.S.C.section 360bbb-3(b)(1), unless the authorization is terminated  or revoked sooner.       Influenza A by PCR NEGATIVE NEGATIVE Final   Influenza B by PCR NEGATIVE NEGATIVE Final    Comment: (NOTE) The Xpert Xpress SARS-CoV-2/FLU/RSV plus assay is intended as an aid in the diagnosis of influenza from Nasopharyngeal swab specimens and should not be used as a sole basis for treatment. Nasal washings and aspirates are unacceptable for Xpert  Xpress SARS-CoV-2/FLU/RSV testing.  Fact Sheet for Patients: BloggerCourse.com  Fact Sheet for Healthcare Providers: SeriousBroker.it  This test is not yet approved or cleared by the [place] FDA and has been authorized for detection and/or diagnosis of SARS-CoV-2 by FDA under an Emergency Use Authorization (EUA). This EUA will remain in effect (meaning this test can be used) for the duration of the COVID-19 declaration under Section 564(b)(1) of the Act, 21 U.S.C. section 360bbb-3(b)(1), unless the authorization  is terminated or revoked.     Resp Syncytial Virus by PCR NEGATIVE NEGATIVE Final    Comment: (NOTE) Fact Sheet for Patients: BloggerCourse.com  Fact Sheet for Healthcare Providers: SeriousBroker.it  This test is not yet approved or cleared by the [place] FDA and has been authorized for detection and/or diagnosis of SARS-CoV-2 by FDA under an Emergency Use Authorization (EUA). This EUA will remain in effect (meaning this test can be used) for the duration of the COVID-19 declaration under Section 564(b)(1) of the Act, 21 U.S.C. section 360bbb-3(b)(1), unless the authorization is terminated or revoked.  Performed at [facility], [address], [place], [place] [address]   Urine Culture     Status: Abnormal   Collection Time: 07/28/22  5:56 AM   Specimen: Urine, Random  Result Value Ref Range Status   Specimen Description   Final    URINE, RANDOM Performed at [facility], [address], [place], [place] [address]    Special Requests   Final    NONE Reflexed from [id number] Performed at [facility], [address], [place], [place] [address]    Culture (A)  Final    <10,000 COLONIES/mL INSIGNIFICANT GROWTH Performed at [facility], [address] [place], [place] [address]    Report Status 07/29/2022 FINAL  Final  MRSA Next Gen by PCR, Nasal     Status: Abnormal   Collection Time: 07/28/22 12:51 PM   Specimen: Nasal Mucosa; Nasal Swab  Result Value Ref Range Status   MRSA by PCR Next Gen DETECTED (A) NOT DETECTED Final    Comment: (NOTE) The GeneXpert MRSA Assay (FDA approved for NASAL specimens only), is one component of a comprehensive MRSA colonization surveillance program. It is not intended to diagnose MRSA infection nor to guide or monitor treatment for MRSA infections. Test performance is not FDA approved in patients less than 34  years old. Performed at [facility], [address], [place], [place] [address]    Pertinent Lab seen by me:    Latest Ref Rng & Units 07/29/2022    3:13 AM 07/28/2022    2:09 AM 07/19/2022    4:56 PM  CBC  WBC 4.0 - 10.5 K/uL 12.0  10.5  7.7   Hemoglobin 13.0 - 17.0 g/dL 8.6  9.1  9.3   Hematocrit 39.0 - 52.0 % 26.2  27.6  28.2   Platelets 150 - 400 K/uL 124  121  214       Latest Ref Rng & Units 07/29/2022    3:13 AM 07/28/2022    2:09 AM 07/19/2022    4:56 PM  CMP  Glucose 70 - 99 mg/dL 962  952  841   BUN 8 - 23 mg/dL 33  29  24   Creatinine 0.61 - 1.24 mg/dL 3.24  4.01  0.27   Sodium 135 - 145 mmol/L 129  129  133   Potassium 3.5 - 5.1 mmol/L 4.0  4.1  5.1   Chloride 98 - 111 mmol/L 98  97  97   CO2 22 - 32 mmol/L 24  23  28    Calcium 8.9 - 10.3 mg/dL 8.3  8.2  8.7   Total Protein 6.5 - 8.1 g/dL  5.9  5.9   Total Bilirubin 0.3 - 1.2 mg/dL  1.0  0.5   Alkaline Phos 38 - 126 U/L  64    AST 15 - 41 U/L  28  17   ALT 0 - 44 U/L  32  22      Pertinent Imagings/Other Imagings Plain films and CT images have been personally visualized and interpreted; radiology reports have been reviewed. Decision making incorporated into the Impression / Recommendations.  CT CHEST ABDOMEN PELVIS W CONTRAST  Result Date: 07/28/2022 CLINICAL DATA:  Sepsis. Shortness of breath, elevated white count, CHF, hepatitis. EXAM: CT CHEST, ABDOMEN, AND PELVIS WITH CONTRAST TECHNIQUE: Multidetector CT imaging of the chest, abdomen and pelvis was performed following the standard protocol during bolus administration of intravenous contrast. RADIATION DOSE REDUCTION: This exam was performed according to the departmental dose-optimization program which includes automated exposure control, adjustment of the mA and/or kV according to patient size and/or use of iterative reconstruction technique. CONTRAST:  OMNIPAQUE IOHEXOL 300 MG/ML  SOLN COMPARISON:  06/24/2022. FINDINGS: CT CHEST  FINDINGS Cardiovascular: The heart is normal in size and there is no pericardial effusion. Multi-vessel coronary artery calcifications are noted. There is atherosclerotic calcification of the aorta without evidence of aneurysm. The pulmonary trunk is normal in caliber. Mediastinum/Nodes: Enlarged lymph nodes are present in the mediastinum measuring up to 1.5 cm in the precarinal space. No hilar or axillary lymphadenopathy. A subcentimeter hypodensity is noted in the left lobe of the thyroid gland. No additional imaging is recommended. The trachea and esophagus are within normal limits. There is a small hiatal hernia. Lungs/Pleura: There are small bilateral pleural effusions bilaterally with compressive atelectasis. Diffuse hazy airspace opacities are present in the lungs bilaterally, greater on the right than on the left. No pneumothorax. Musculoskeletal: Sternotomy wires are noted. Degenerative changes are present in the thoracic spine. No acute osseous abnormality is seen. CT ABDOMEN PELVIS FINDINGS Hepatobiliary: Cyst is noted in the left lobe of the liver. A stable hypodensity is noted in the posterior right lobe of the liver measuring 4.5 cm. The gallbladder is without stones. No biliary ductal dilatation is seen. Pancreas: Unremarkable. No pancreatic ductal dilatation or surrounding inflammatory changes. Spleen: Normal in size without focal abnormality. Adrenals/Urinary Tract: The adrenal glands are within normal limits. The kidneys enhance symmetrically. There is a cyst in the lower pole of the left kidney. A slightly complex hypodensity is noted in the lower pole of the right kidney measuring 2 cm. No renal calculus or hydronephrosis. The bladder is unremarkable. Stomach/Bowel: There is a small hiatal hernia. Stomach is within normal limits. Appendix appears normal. No evidence of bowel wall thickening, distention, or inflammatory changes. No free air or pneumatosis. Scattered diverticula are present along  the colon without evidence of diverticulitis. Vascular/Lymphatic: Aortic atherosclerosis. No enlarged abdominal or pelvic lymph nodes. Reproductive: Prostate is unremarkable. Other: Small amount free fluid in the pelvis. Musculoskeletal: Degenerative changes are present in the lumbar spine. No acute fracture is seen. IMPRESSION: 1. Hazy diffuse ground-glass opacities in the lungs bilaterally, greater on the right than on the left, possible edema versus multifocal pneumonia. 2. Small bilateral pleural effusions with compressive atelectasis. 3. Stable indeterminate hypodensity in the posterior right lobe  of the liver measuring 4.5 cm. MRI with contrast is recommended for further evaluation on nonemergent follow-up given history of hepatitis. 4. Small ascites in the pelvis. 5. Complex cyst in the mid right kidney measuring 2 cm. Lesion may also be better evaluated on MRI. 6. Small hiatal hernia. 7. Aortic atherosclerosis and coronary artery calcifications. Electronically Signed   By: Thornell Sartorius M.D.   On: 07/28/2022 04:42   DG Chest Port 1 View  Result Date: 07/28/2022 CLINICAL DATA:  Sepsis.  Fever and shortness of breath. EXAM: PORTABLE CHEST 1 VIEW COMPARISON:  06/26/2022. FINDINGS: Heart is enlarged in the mediastinal contour is within normal limits. A prosthetic cardiac valve and left atrial appendage clip are noted. There is atherosclerotic calcification of the aorta. The pulmonary vasculature is distended. Diffuse hazy airspace opacities are noted in the lungs bilaterally, greater on the right than on the left. There are small bilateral pleural effusions. No pneumothorax. Sternotomy wires are noted. No acute osseous abnormality. IMPRESSION: 1. Cardiomegaly with pulmonary vascular congestion. 2. Diffuse airspace opacities bilaterally, possible edema or infiltrate. 3. Small bilateral pleural effusions. Electronically Signed   By: Thornell Sartorius M.D.   On: 07/28/2022 02:41     I have personally spent 90  minutes involved in face-to-face and non-face-to-face activities for this patient on the day of the visit. Professional time spent includes the following activities: Preparing to see the patient (review of tests), Obtaining and/or reviewing separately obtained history (admission/discharge record), Performing a medically appropriate examination and/or evaluation , Ordering medications/tests/procedures, referring and communicating with other health care professionals, Documenting clinical information in the EMR, Independently interpreting results (not separately reported), Communicating results to the patient/family/caregiver, Counseling and educating the patient/family/caregiver and Care coordination (not separately reported).  Electronically signed by:   Plan d/w requesting provider as well as ID pharm D  Note: This document was prepared using dragon voice recognition software and may include unintentional dictation errors.   Odette Fraction, MD Infectious Disease Physician [facility] Pager: [phone]

## 2022-07-29 NOTE — Evaluation (Signed)
Physical Therapy Evaluation Patient Details Name: [name] MRN: [id number] DOB: May 06, 1940 Today's Date: 07/29/2022  History of Present Illness  [name] is a 82 y.o. male who presented from home [facility], ILF/ALF) on 07/28/22 with sepsis from Streptococcal bacteremia secondary to  Multifocal pneumonia with associated acute hypoxic respiratory failure as well as acute on chronic CHF.  Pt had recently been admitted last month with low back pain and constipation and found to have low sodium 117.  Recent (May) MRI lumbar spine is notable for artifact versus cord edema at T11-T12.  PMHx: CAD status post CABG in 2019, bioprosthetic aortic valve replacement, cardiomyopathy with recovered EF and gout.  Clinical Impression  Pt admitted with above diagnosis.  Pt currently with functional limitations due to the deficits listed below (see PT Problem List). Pt will benefit from acute skilled PT to increase their independence and safety with mobility to allow discharge.  Pt OOB with OT on arrival.  Pt able to perform sit to stands and ambulate 140 ft in hallway with RW with contact guard assist.  Pt plans to return to [facility] upon d/c.         Recommendations for follow up therapy are one component of a multi-disciplinary discharge planning process, led by the attending physician.  Recommendations may be updated based on patient status, additional functional criteria and insurance authorization.  Follow Up Recommendations       Assistance Recommended at Discharge PRN  Patient can return home with the following  A little help with walking and/or transfers;A little help with bathing/dressing/bathroom;Help with stairs or ramp for entrance;Assistance with cooking/housework    Equipment Recommendations None recommended by PT  Recommendations for Other Services       Functional Status Assessment Patient has had a recent decline in their functional status and demonstrates the  ability to make significant improvements in function in a reasonable and predictable amount of time.     Precautions / Restrictions Precautions Precautions: Fall Restrictions Weight Bearing Restrictions: No      Mobility  Bed Mobility               General bed mobility comments: pt OOB with OT    Transfers Overall transfer level: Needs assistance Equipment used: Rolling walker (2 wheels) Transfers: Sit to/from Stand Sit to Stand: Min guard           General transfer comment: cues for hand placemnent    Ambulation/Gait Ambulation/Gait assistance: Min guard Gait Distance (Feet): 140 Feet Assistive device: Rolling walker (2 wheels) Gait Pattern/deviations: Step-through pattern, Decreased stride length Gait velocity: decr     General Gait Details: slow but steady pace with RW, pt denies back spasms or pain at this time and reports being happy to be OOB, VSS with activity  Stairs            Wheelchair Mobility    Modified Rankin (Stroke Patients Only)       Balance           Standing balance support: No upper extremity supported Standing balance-Leahy Scale: Fair                               Pertinent Vitals/Pain Pain Assessment Pain Assessment: No/denies pain    Home Living Family/patient expects to be discharged to:: Assisted living                 Home Equipment: Rollator (  4 wheels);BSC/3in1;Shower seat;Rolling Walker (2 wheels);Hand held shower head;Hospital bed;Wheelchair - Building surveyor      Prior Function Prior Level of Function : Independent/Modified Independent;Needs assist (Prior to his past hospitalization in May.)       Physical Assist : ADLs (physical);Mobility (physical)   ADLs (physical): IADLs Mobility Comments: Since home from [facility], son reports that pt has had recurrent back spasms and has required assistance with all mobility including all ambulation. Pt and son do deny any  falls. ADLs Comments: [employer] provides all housekeeping and meals. Since home from [facility] rehab pt's son reports that a CNA assists pt out of bed and with toileting. PT has been assistsed with showers and ddressing since return home as well.  Prior to May, pt was completely Independent and playing Disc Golf.     Hand Dominance   Dominant Hand: Right    Extremity/Trunk Assessment   Upper Extremity Assessment Upper Extremity Assessment: Overall WFL for tasks assessed    Lower Extremity Assessment Lower Extremity Assessment: Overall WFL for tasks assessed    Cervical / Trunk Assessment Cervical / Trunk Assessment: Normal  Communication   Communication: HOH (Has head aid)  Cognition Arousal/Alertness: Awake/alert Behavior During Therapy: WFL for tasks assessed/performed Overall Cognitive Status: Within Functional Limits for tasks assessed                                 General Comments: pt in good spirits today        General Comments      Exercises     Assessment/Plan    PT Assessment Patient needs continued PT services  PT Problem List Decreased mobility;Decreased strength;Decreased activity tolerance;Decreased balance;Decreased knowledge of use of DME       PT Treatment Interventions DME instruction;Gait training;Balance training;Therapeutic exercise;Functional mobility training;Patient/family education;Therapeutic activities    PT Goals (Current goals can be found in the Care Plan section)  Acute Rehab PT Goals PT Goal Formulation: With patient Time For Goal Achievement: 08/12/22 Potential to Achieve Goals: Good    Frequency Min 1X/week     Co-evaluation PT/OT/SLP Co-Evaluation/Treatment: Yes Reason for Co-Treatment: For patient/therapist safety PT goals addressed during session: Mobility/safety with mobility OT goals addressed during session: ADL's and self-care       AM-PAC PT "6 Clicks" Mobility  Outcome Measure Help needed  turning from your back to your side while in a flat bed without using bedrails?: A Little Help needed moving from lying on your back to sitting on the side of a flat bed without using bedrails?: A Little Help needed moving to and from a bed to a chair (including a wheelchair)?: A Little Help needed standing up from a chair using your arms (e.g., wheelchair or bedside chair)?: A Little Help needed to walk in hospital room?: A Little Help needed climbing 3-5 steps with a railing? : A Lot 6 Click Score: 17    End of Session Equipment Utilized During Treatment: Gait belt Activity Tolerance: Patient tolerated treatment well Patient left: in chair;with call bell/phone within reach;with family/visitor present Nurse Communication: Mobility status PT Visit Diagnosis: Difficulty in walking, not elsewhere classified (R26.2)    Time: 8413-2440 PT Time Calculation (min) (ACUTE ONLY): 12 min   Charges:   PT Evaluation $PT Eval Low Complexity: 1 Low         Kati PT, DPT Physical Therapist Acute Rehabilitation Services Office: [phone]   Janan Halter P[place] 07/29/2022,  12:41 PM

## 2022-07-29 NOTE — Evaluation (Signed)
Occupational Therapy Evaluation Patient Details Name: Maryland Luppino MRN: [id number] DOB: 07/18/1940 Today's Date: 07/29/2022   History of Present Illness Jahziah Simonin is a 82 y.o. male who presented from home [facility], ILF/ALF) on 07/28/22 with sepsis from HCAP and acute on chronic CHF.  Pt had recently been admitted last month with low back pain and constipation and found to have low sodium 117.  Recent (May) MRI lumbar spine is notable for artifact versus cord edema at T11-T12.  PMHx: CAD status post CABG in 2019, bioprosthetic aortic valve replacement, cardiomyopathy with recovered EF and gout.   Clinical Impression   Patient is currently requiring assistance with ADLs including up to min assist with Lower body ADLs, setup assist with Upper body ADLs,  as well as supervision with bed mobility and min guard assist with functional transfers to [facility].  Current level of function is below patient's typical baseline, however pt has not been baseline since prior to his previous hospitalization in mid-May.   During this evaluation, patient was limited by generalized weakness, and impaired activity tolerance, which has the potential to impact patient's safety and independence during functional mobility, as well as performance for ADLs.  Patient lives at [facility] in the ILF section but has meals and housekeeping provided.  Patient demonstrates good rehab potential, and should benefit from continued skilled occupational therapy services while in acute care to maximize safety, independence and quality of life at home.  Continued occupational therapy services are recommended.  ?       Recommendations for follow up therapy are one component of a multi-disciplinary discharge planning process, led by the attending physician.  Recommendations may be updated based on patient status, additional functional criteria and insurance authorization.   Assistance Recommended at Discharge Intermittent  Supervision/Assistance  Patient can return home with the following A little help with bathing/dressing/bathroom;A little help with walking and/or transfers;Assistance with cooking/housework    Functional Status Assessment  Patient has had a recent decline in their functional status and demonstrates the ability to make significant improvements in function in a reasonable and predictable amount of time.  Equipment Recommendations  None recommended by OT    Recommendations for Other Services       Precautions / Restrictions Precautions Precautions: Fall Restrictions Weight Bearing Restrictions: No      Mobility Bed Mobility Overal bed mobility: Needs Assistance Bed Mobility: Supine to Sit     Supine to sit: Supervision, HOB elevated          Transfers                          Balance Overall balance assessment: Needs assistance Sitting-balance support: Feet supported, No upper extremity supported Sitting balance-Leahy Scale: Good     Standing balance support: During functional activity, Single extremity supported Standing balance-Leahy Scale: Fair                             ADL either performed or assessed with clinical judgement   ADL Overall ADL's : Needs assistance/impaired Eating/Feeding: Independent   Grooming: Wash/dry hands;Min guard;Standing   Upper Body Bathing: Sitting;Set up;Supervision/ safety   Lower Body Bathing: Minimal assistance;Sit to/from stand;Sitting/lateral leans Lower Body Bathing Details (indicate cue type and reason): Based on general assessment. Upper Body Dressing : Set up;Sitting Upper Body Dressing Details (indicate cue type and reason): Assist for lines only Lower Body Dressing: Minimal assistance;Sit to/from  stand Lower Body Dressing Details (indicate cue type and reason): Based on clothing management when lowering to [facility]. Toilet Transfer: Stand-pivot;Cueing for sequencing;Cueing for safety;BSC/3in1;Min  Pension scheme manager Details (indicate cue type and reason): Pt stood from EOB with cues for wear hands should reach when pivoting to [facility]. Pt performed Stand pivot without AD and with Min guard. Pt stood from [facility] with Min guard. Toileting- Architect and Hygiene: Min guard;Sitting/lateral lean Toileting - Clothing Manipulation Details (indicate cue type and reason): Pt was unable to void bowels but appears to have UE ROM and strength suffient for hygiene.  Had foley cath.     Functional mobility during ADLs: Min guard;Minimal assistance;Rolling walker (2 wheels);Cueing for safety;Cueing for sequencing       Vision   Vision Assessment?: No apparent visual deficits     Perception     Praxis      Pertinent Vitals/Pain Pain Assessment Pain Assessment: No/denies pain     Hand Dominance Right   Extremity/Trunk Assessment Upper Extremity Assessment Upper Extremity Assessment: Overall WFL for tasks assessed   Lower Extremity Assessment Lower Extremity Assessment: Defer to PT evaluation   Cervical / Trunk Assessment Cervical / Trunk Assessment: Normal   Communication Communication Communication: HOH (Has head aid)   Cognition Arousal/Alertness: Awake/alert Behavior During Therapy: WFL for tasks assessed/performed Overall Cognitive Status: Within Functional Limits for tasks assessed                                 General Comments: Ox4     General Comments       Exercises     Shoulder Instructions      Home Living Family/patient expects to be discharged to:: Assisted living                             Home Equipment: Rollator (4 wheels);BSC/3in1;Shower seat;Rolling Walker (2 wheels);Hand held shower head;Hospital bed;Wheelchair - Civil engineer, contracting: Reacher;Sock aid;Long-handled shoe horn;Long-handled sponge        Prior Functioning/Environment Prior Level of Function : Independent/Modified  Independent;Needs assist (Prior to his past hospitalization in May.)       Physical Assist : ADLs (physical);Mobility (physical)   ADLs (physical): IADLs Mobility Comments: Since home from [facility], son reports that pt has had recurrent back spasms and has required assistance with all mobility including all ambulation. Pt and son do deny any falls. ADLs Comments: [employer] provides all housekeeping and meals. Since home from [facility] rehab pt's son reports that a CNA assists pt out of bed and with toileting. PT has been assistsed with showers and ddressing since return home as well.  Prior to May, pt was completely Independent and playing Disc Golf.        OT Problem List: Decreased strength;Decreased activity tolerance;Impaired balance (sitting and/or standing);Cardiopulmonary status limiting activity      OT Treatment/Interventions: Self-care/ADL training;Therapeutic activities;Therapeutic exercise;Energy conservation;Patient/family education;Balance training;DME and/or AE instruction    OT Goals(Current goals can be found in the care plan section) Acute Rehab OT Goals Patient Stated Goal: Get out of bed OT Goal Formulation: With patient/family Time For Goal Achievement: 08/12/22 Potential to Achieve Goals: Good ADL Goals Pt Will Perform Lower Body Bathing: sitting/lateral leans;with adaptive equipment;with supervision (Using energy conservation principles.) Pt Will Perform Lower Body Dressing: with adaptive equipment;with modified independence;sit to/from stand;sitting/lateral leans Pt Will Transfer to Toilet: with modified  independence;bedside commode;ambulating Pt Will Perform Toileting - Clothing Manipulation and hygiene: with modified independence;sitting/lateral leans;sit to/from stand Additional ADL Goal #1: Patient will identify at least 3 energy conservation strategies to employ at home in order to maximize function and quality of life and decrease caregiver burden while  preventing exacerbation of symptoms and rehospitalization.  OT Frequency: Min 1X/week    Co-evaluation PT/OT/SLP Co-Evaluation/Treatment: Yes (Partially for line management during hallway ambulation.) Reason for Co-Treatment: For patient/therapist safety PT goals addressed during session: Mobility/safety with mobility OT goals addressed during session: ADL's and self-care      AM-PAC OT "6 Clicks" Daily Activity     Outcome Measure Help from another person eating meals?: None Help from another person taking care of personal grooming?: A Little Help from another person toileting, which includes using toliet, bedpan, or urinal?: A Little Help from another person bathing (including washing, rinsing, drying)?: A Little Help from another person to put on and taking off regular upper body clothing?: A Little Help from another person to put on and taking off regular lower body clothing?: A Little 6 Click Score: 19   End of Session Equipment Utilized During Treatment: Gait belt;Rolling walker (2 wheels) Nurse Communication: Mobility status  Activity Tolerance: Patient tolerated treatment well Patient left: in chair;with call bell/phone within reach;with family/visitor present  OT Visit Diagnosis: Muscle weakness (generalized) (M62.81)                Time: 1610-9604 OT Time Calculation (min): 32 min Charges:  OT General Charges $OT Visit: 1 Visit OT Evaluation $OT Eval Low Complexity: 1 Low OT Treatments $Self Care/Home Management : 8-22 mins  Victorino Dike, OT Acute Rehab Services Office: [phone] 07/29/2022  Theodoro Clock 07/29/2022, 11:56 AM

## 2022-07-29 NOTE — TOC Initial Note (Signed)
Transition of Care [facility]) - Initial/Assessment Note    Patient Details  Name: [name] MRN: [id number] Date of Birth: 11/14/1940  [facility] CM/SW Contact:    Amada Jupiter, LCSW Phone Number: 07/29/2022, 3:34 PM  Clinical Narrative:                  Met with pt today to assess for possible dc planning needs. Pt confirms that he is a resident in [place] apt at [facility] and plans to return at dc.  He was widowed in April 2024 and has been managing well with prn assist from private duty caregiver.  He has good support from friends and family.  He was working with therapies at [facility] through [employer] and plans to resume on return.  Have faxed HH orders to [facility].  Will continue to follow for any additional needs - pt may need PTAR for dc transportation.   Expected Discharge Plan:  (IL at [facility]) Barriers to Discharge: Continued Medical Work up   Patient Goals and CMS Choice Patient states their goals for this hospitalization and ongoing recovery are:: return to his IL apt          Expected Discharge Plan and Services In-house Referral: Clinical Social Work     Living arrangements for the past 2 months: Independent Living Facility ([facility])                           HH Arranged: PT, OT HH Agency: Other - See comment Transport planner) Date HH Agency Contacted: 07/29/22   Representative spoke with at [facility] Agency: Rene Kocher  Prior Living Arrangements/Services Living arrangements for the past 2 months: Independent Living Facility ([facility]) Lives with:: Facility Resident, Self Patient language and need for interpreter reviewed:: Yes Do you feel safe going back to the place where you live?: Yes      Need for Family Participation in Patient Care: No (Comment) Care giver support system in place?: Yes (comment) Current home services: Home PT, Home OT (active with [facility] for therapy) Criminal Activity/Legal  Involvement Pertinent to Current Situation/Hospitalization: No - Comment as needed  Activities of Daily Living      Permission Sought/Granted Permission sought to share information with : Photographer granted to share info w AGENCY: [facility]        Emotional Assessment Appearance:: Appears stated age Attitude/Demeanor/Rapport: Gracious, Engaged Affect (typically observed): Accepting Orientation: : Oriented to Self, Oriented to Place, Oriented to  Time, Oriented to Situation Alcohol / Substance Use: Not Applicable Psych Involvement: No (comment)  Admission diagnosis:  Fever, unspecified fever cause [R50.9] Acute on chronic congestive heart failure, unspecified heart failure type (HCC) [I50.9] Acute hypoxic respiratory failure (HCC) [J96.01] Patient Active Problem List   Diagnosis Date Noted   Sepsis (HCC) 07/29/2022   Streptococcal bacteremia 07/29/2022   Multifocal pneumonia 07/29/2022   Acute on chronic diastolic CHF (congestive heart failure) (HCC) 07/29/2022   Aortic atherosclerosis (HCC) 07/29/2022   Medication management 07/29/2022   Bacteremia 07/29/2022   Acute hypoxic respiratory failure (HCC) 07/28/2022   Hyponatremia 06/24/2022   Low back pain 06/24/2022   Constipation 06/24/2022   Renal cyst, 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   PAC (premature atrial contraction) 03/01/2021   PVC (premature ventricular contraction) 03/01/2021   Atrial bigeminy 02/28/2020   OA (osteoarthritis) of knee 08/07/2019   Primary osteoarthritis of right knee 08/07/2019  Coronary artery disease involving coronary bypass graft of native heart without angina pectoris 09/19/2018   Fatigue 08/01/2018   Mixed hyperlipidemia 06/06/2018   S/P left atrial appendage ligation 07/25/2017   S/P AVR 07/25/2017   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   Coronary artery disease without angina pectoris 07/12/2017   Mitral regurgitation 07/07/2017   (HFpEF) heart failure with preserved  ejection fraction (HCC) 07/07/2017   Gout 02/12/2013   Essential hypertension 02/12/2013   GERD (gastroesophageal reflux disease) 02/12/2013   PCP:  Caesar Bookman, NP Pharmacy:   [phone] - [place], [place] - [address] [place] [address] Phone: [phone] Fax: [phone]     Social Determinants of Health (SDOH) Social History: SDOH Screenings   Food Insecurity: No Food Insecurity (05/13/2022)  Housing: Low Risk  (05/13/2022)  Transportation Needs: No Transportation Needs (05/13/2022)  Utilities: Not At Risk (05/13/2022)  Alcohol Screen: Low Risk  (05/13/2022)  Depression (PHQ2-9): Low Risk  (07/19/2022)  Financial Resource Strain: Low Risk  (05/13/2022)  Physical Activity: Sufficiently Active (05/13/2022)  Social Connections: Socially Integrated (05/13/2022)  Stress: No Stress Concern Present (05/13/2022)  Tobacco Use: Medium Risk (07/28/2022)   SDOH Interventions:     Readmission Risk Interventions    07/29/2022    3:31 PM  Readmission Risk Prevention Plan  Transportation Screening Complete  PCP or Specialist Appt within 5-7 Days Complete  Home Care Screening Complete  Medication Review (RN CM) Complete

## 2022-07-29 NOTE — Hospital Course (Addendum)
82 year old man from independent living, PMH including diastolic CHF, aortic valve replacement, recent admission for hyponatremia, presented with fever and respiratory distress requiring 8 L high flow nasal cannula, noted to be tachycardic and febrile.  Admitted for sepsis secondary to multifocal pneumonia, acute on chronic diastolic CHF.  Subsequently found to be bacteremic.  Consultants ID  Procedures None

## 2022-07-29 NOTE — Progress Notes (Signed)
PHARMACY - PHYSICIAN COMMUNICATION CRITICAL VALUE ALERT - BLOOD CULTURE IDENTIFICATION (BCID)  [name] is an 82 y.o. male who presented to [facility] on 07/28/2022 with Sepsis/HCAP.  Assessment:  BCID + Streptococcus species (no resistance detected) in 4/4 bottles.  Patient's MRSA PCR = +  Name of physician (or Provider) ContactedCliffton Asters, FNP  Current antibiotics: Cefepime and Vancomycin  Changes to prescribed antibiotics recommended:  Due to possible multifocal pneumonia noted and MRSA PCR +, no change in antibiotic regimen at this time.  Results for orders placed or performed during the hospital encounter of 07/28/22  Blood Culture ID Panel (Reflexed) (Collected: 07/28/2022  3:04 AM)  Result Value Ref Range   Enterococcus faecalis NOT DETECTED NOT DETECTED   Enterococcus Faecium NOT DETECTED NOT DETECTED   Listeria monocytogenes NOT DETECTED NOT DETECTED   Staphylococcus species NOT DETECTED NOT DETECTED   Staphylococcus aureus (BCID) NOT DETECTED NOT DETECTED   Staphylococcus epidermidis NOT DETECTED NOT DETECTED   Staphylococcus lugdunensis NOT DETECTED NOT DETECTED   Streptococcus species DETECTED (A) NOT DETECTED   Streptococcus agalactiae NOT DETECTED NOT DETECTED   Streptococcus pneumoniae NOT DETECTED NOT DETECTED   Streptococcus pyogenes NOT DETECTED NOT DETECTED   A.calcoaceticus-baumannii NOT DETECTED NOT DETECTED   Bacteroides fragilis NOT DETECTED NOT DETECTED   Enterobacterales NOT DETECTED NOT DETECTED   Enterobacter cloacae complex NOT DETECTED NOT DETECTED   Escherichia coli NOT DETECTED NOT DETECTED   Klebsiella aerogenes NOT DETECTED NOT DETECTED   Klebsiella oxytoca NOT DETECTED NOT DETECTED   Klebsiella pneumoniae NOT DETECTED NOT DETECTED   Proteus species NOT DETECTED NOT DETECTED   Salmonella species NOT DETECTED NOT DETECTED   Serratia marcescens NOT DETECTED NOT DETECTED   Haemophilus influenzae NOT DETECTED NOT DETECTED   Neisseria  meningitidis NOT DETECTED NOT DETECTED   Pseudomonas aeruginosa NOT DETECTED NOT DETECTED   Stenotrophomonas maltophilia NOT DETECTED NOT DETECTED   Candida albicans NOT DETECTED NOT DETECTED   Candida auris NOT DETECTED NOT DETECTED   Candida glabrata NOT DETECTED NOT DETECTED   Candida krusei NOT DETECTED NOT DETECTED   Candida parapsilosis NOT DETECTED NOT DETECTED   Candida tropicalis NOT DETECTED NOT DETECTED   Cryptococcus neoformans/gattii NOT DETECTED NOT DETECTED    Maryellen Pile, PharmD 07/29/2022  12:49 AM

## 2022-07-30 DIAGNOSIS — I5021 Acute systolic (congestive) heart failure: Secondary | ICD-10-CM | POA: Diagnosis not present

## 2022-07-30 DIAGNOSIS — I5033 Acute on chronic diastolic (congestive) heart failure: Secondary | ICD-10-CM | POA: Diagnosis not present

## 2022-07-30 DIAGNOSIS — R7881 Bacteremia: Secondary | ICD-10-CM | POA: Diagnosis not present

## 2022-07-30 DIAGNOSIS — A409 Streptococcal sepsis, unspecified: Secondary | ICD-10-CM | POA: Diagnosis not present

## 2022-07-30 LAB — CULTURE, BLOOD (ROUTINE X 2): Special Requests: ADEQUATE

## 2022-07-30 LAB — ECHOCARDIOGRAM COMPLETE
AR max vel: 0.89 cm2
AV Area VTI: 0.92 cm2
AV Area mean vel: 0.89 cm2
AV Mean grad: 26 mmHg
AV Peak grad: 36.3 mmHg
Ao pk vel: 3.01 m/s
Height: 71 in
S' Lateral: 4.8 cm
Weight: 2640 [oz_av]

## 2022-07-30 LAB — BASIC METABOLIC PANEL
Anion gap: 9 (ref 5–15)
BUN: 27 mg/dL — ABNORMAL HIGH (ref 8–23)
CO2: 28 mmol/L (ref 22–32)
Calcium: 8.7 mg/dL — ABNORMAL LOW (ref 8.9–10.3)
Chloride: 94 mmol/L — ABNORMAL LOW (ref 98–111)
Creatinine, Ser: 1.01 mg/dL (ref 0.61–1.24)
GFR, Estimated: >60 mL/min (ref >60)
Glucose, Bld: 113 mg/dL — ABNORMAL HIGH (ref 70–99)
Potassium: 3.8 mmol/L (ref 3.5–5.1)
Sodium: 131 mmol/L — ABNORMAL LOW (ref 135–145)

## 2022-07-30 LAB — PHOSPHORUS: Phosphorus: 2.9 mg/dL (ref 2.5–4.6)

## 2022-07-30 LAB — MAGNESIUM: Magnesium: 1.9 mg/dL (ref 1.7–2.4)

## 2022-07-30 NOTE — Progress Notes (Signed)
Mobility Specialist - Progress Note   07/30/22 1043  Mobility  Activity Ambulated with assistance in hallway  Level of Assistance Contact guard assist, steadying assist  Assistive Device Front wheel walker  Distance Ambulated (ft) 350 ft  Range of Motion/Exercises Active  Activity Response Tolerated well  Mobility Referral Yes  $Mobility charge 1 Mobility  Mobility Specialist Start Time (ACUTE ONLY) 1020  Mobility Specialist Stop Time (ACUTE ONLY) 1040  Mobility Specialist Time Calculation (min) (ACUTE ONLY) 20 min   Pt was found on recliner chair and agreeable to ambulate. Had about x7 brief pauses during ambulation standing and x1 seated rest break at ~225' for ~21min. At EOS returned to recliner chair with all needs met and call bell in reach. Chair alarm on.  Billey Chang Mobility Specialist

## 2022-07-30 NOTE — Progress Notes (Signed)
Mobility Specialist - Progress Note   07/30/22 1519  Mobility  Activity Ambulated with assistance in hallway  Level of Assistance Minimal assist, patient does 75% or more  Assistive Device Front wheel walker  Distance Ambulated (ft) 550 ft  Range of Motion/Exercises Active Assistive  Activity Response Tolerated fair  Mobility Referral Yes  $Mobility charge 1 Mobility  Mobility Specialist Start Time (ACUTE ONLY) 1455  Mobility Specialist Stop Time (ACUTE ONLY) 1519  Mobility Specialist Time Calculation (min) (ACUTE ONLY) 24 min   Pt was found on recliner chair and eager to ambulate. Pt was min-A from STS with x2 failed attempts. Was able to ambulate ~416ft and then took a seated rest break for ~30min. Afterwards ambulated ~126ft back to his room with a chair follow. Had x2 knee buckles during ambulation due to weakness. At EOS returned to recliner chair with all needs met. Call bell in reach.   Billey Chang Mobility Specialist

## 2022-07-30 NOTE — Progress Notes (Signed)
  Progress Note   Patient: [name] ZOX:[id number] DOB: 1940-12-31 DOA: 07/28/2022     2 DOS: the patient was seen and examined on 07/30/2022   Brief hospital course: 82 year old man from independent living, PMH including diastolic CHF, aortic valve replacement, recent admission for hyponatremia, presented with fever and respiratory distress requiring 8 L high flow nasal cannula, noted to be tachycardic and febrile.  Admitted for sepsis secondary to multifocal pneumonia, acute on chronic diastolic CHF.  Subsequently found to be bacteremic.  Consultants ID  Procedures None  Assessment and Plan: Sepsis secondary to High grade Strept sanguinis bacteremia   Multifocal pneumonia with associated acute hypoxic respiratory failure with SpO2 79% per chart Sepsis pathophysiology resolved.  Hypoxia resolved. Continue ceftriaxone   ID appreciated. TTE noted, no vegetations seen TEE to rule out endocarditis    Acute on chronic diastolic congestive heart failure Acute systolic CHF Elevated BNP on admission.  Echocardiogram February 2023 showed grade 2 diastolic dysfunction.  Appears euvolemic.  Follow-up echocardiogram.  Not on diuretics as outpatient.  Monitor volume status. Continue aspirin, carvedilol Transthoracic echocardiogram shows decreased EF compared to previous study February 2023 with LVEF 35-40%, global hypokinesis left ventricle.  Right ventricular systolic function low normal.  Pericardial valve well-seated. Appears euvolemic. Close outpatient follow-up with cardiology.  Blood pressure soft.  No other antihypertensives or CHF meds currently.   Stable liver hypodensity, and complex renal cyst Consider outpatient MRI    Aortic atherosclerosis and coronary artery calcifications. Can resume statin on discharge.      Subjective:  Feels good Wants to go home  Physical Exam: Vitals:   07/30/22 0109 07/30/22 0524 07/30/22 0805 07/30/22 1318  BP: 113/68 116/70 (!) 102/55  98/60  Pulse: 65 70 69 70  Resp: 18 16 16 20   Temp: 98.1 F (36.7 C) 98.2 F (36.8 C) 97.6 F (36.4 C) 98 F (36.7 C)  TempSrc: Oral Oral Oral Oral  SpO2: 97% 95% 97% 93%  Weight:      Height:       Physical Exam Vitals reviewed.  Constitutional:      General: He is not in acute distress.    Appearance: He is not ill-appearing or toxic-appearing.  Cardiovascular:     Rate and Rhythm: Normal rate and regular rhythm.     Heart sounds: No murmur heard. Pulmonary:     Effort: Pulmonary effort is normal. No respiratory distress.     Breath sounds: No wheezing, rhonchi or rales.  Neurological:     Mental Status: He is alert.  Psychiatric:        Mood and Affect: Mood normal.        Behavior: Behavior normal.     Data Reviewed: BMP noted Na+ 129 > 131 Mg2+ and phos WNL  Family Communication: none   Disposition: Status is: Inpatient Remains inpatient appropriate because: bacteremia  Planned Discharge Destination: Home    Time spent: 20 minutes  Author: Brendia Sacks, MD 07/30/2022 4:49 PM  For on call review [email or web address].

## 2022-07-30 NOTE — Plan of Care (Signed)
  Problem: Clinical Measurements: Goal: Diagnostic test results will improve Outcome: Progressing Goal: Cardiovascular complication will be avoided Outcome: Progressing   Problem: Activity: Goal: Risk for activity intolerance will decrease Outcome: Progressing   Problem: Nutrition: Goal: Adequate nutrition will be maintained Outcome: Progressing   Problem: Safety: Goal: Ability to remain free from injury will improve Outcome: Progressing   

## 2022-07-30 NOTE — Progress Notes (Signed)
    [facility] has been requested to perform a transesophageal echocardiogram on [name] for evaluation of possible endocarditis. Patient was found to have strep sanguinis bacteremia. 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  After careful review of history and examination, the risks and benefits of transesophageal echocardiogram have been explained including risks of esophageal damage, perforation (1:10,000 risk), bleeding, pharyngeal hematoma as well as other potential complications associated with conscious sedation including aspiration, arrhythmia, respiratory failure and death. Alternatives to treatment were discussed, questions were answered. Patient is willing to proceed.   Perlie Gold PA-C 07/30/2022 9:02 AM

## 2022-07-30 NOTE — Progress Notes (Signed)
   07/30/22 0805  Vitals  Temp 97.6 F (36.4 C)  Temp Source Oral  BP (!) 102/55  MAP (mmHg) 70  BP Method Automatic  Patient Position (if appropriate) Lying  Pulse Rate 69  Pulse Rate Source Monitor  ECG Heart Rate 79  Resp 16  MEWS COLOR  MEWS Score Color Green  Oxygen Therapy  SpO2 97 %  O2 Device Room Air  MEWS Score  MEWS Temp 0  MEWS Systolic 0  MEWS Pulse 0  MEWS RR 0  MEWS LOC 0  MEWS Score 0   Patient had 7 beats V tach. No complaints of chest pain. MD notified. Will continue to monitor.

## 2022-07-31 DIAGNOSIS — A409 Streptococcal sepsis, unspecified: Secondary | ICD-10-CM | POA: Diagnosis not present

## 2022-07-31 DIAGNOSIS — J189 Pneumonia, unspecified organism: Secondary | ICD-10-CM | POA: Diagnosis not present

## 2022-07-31 DIAGNOSIS — J9601 Acute respiratory failure with hypoxia: Secondary | ICD-10-CM | POA: Diagnosis not present

## 2022-07-31 DIAGNOSIS — R7881 Bacteremia: Secondary | ICD-10-CM | POA: Diagnosis not present

## 2022-07-31 LAB — CULTURE, BLOOD (ROUTINE X 2)

## 2022-07-31 MED ORDER — SODIUM CHLORIDE 0.9 % IV SOLN: INTRAVENOUS | Status: DC

## 2022-07-31 NOTE — Plan of Care (Signed)
  Problem: Clinical Measurements: Goal: Diagnostic test results will improve Outcome: Progressing Goal: Cardiovascular complication will be avoided Outcome: Progressing   Problem: Activity: Goal: Risk for activity intolerance will decrease Outcome: Progressing   Problem: Pain Managment: Goal: General experience of comfort will improve Outcome: Progressing   Problem: Safety: Goal: Ability to remain free from injury will improve Outcome: Progressing   

## 2022-07-31 NOTE — Progress Notes (Signed)
Mobility Specialist - Progress Note   07/31/22 1158  Mobility  Activity Ambulated with assistance in hallway  Level of Assistance Minimal assist, patient does 75% or more  Assistive Device Front wheel walker  Distance Ambulated (ft) 500 ft  Range of Motion/Exercises Active Assistive  Activity Response Tolerated fair  Mobility Referral Yes  $Mobility charge 1 Mobility  Mobility Specialist Start Time (ACUTE ONLY) 1128  Mobility Specialist Stop Time (ACUTE ONLY) 1158  Mobility Specialist Time Calculation (min) (ACUTE ONLY) 30 min   Pt was found on recliner chair eager to ambulate. Was min-A going from STS and CG for ambulation. Pt ambulated 5x181ft with seated rest breaks for ~81min in between. Stated having a back spasm during ambulation. At EOS returned to recliner chair with all needs met. Call bell in reach and chair alarm on.  Billey Chang Mobility Specialist

## 2022-07-31 NOTE — H&P (View-Only) (Signed)
  Progress Note   Patient: [name] MRN:[id number] DOB: 06/03/1940 DOA: 07/28/2022     3 DOS: the patient was seen and examined on 07/31/2022   Brief hospital course: 82-year-old man from independent living, PMH including diastolic CHF, aortic valve replacement, recent admission for hyponatremia, presented with fever and respiratory distress requiring 8 L high flow nasal cannula, noted to be tachycardic and febrile.  Admitted for sepsis secondary to multifocal pneumonia, acute on chronic diastolic CHF.  Subsequently found to be bacteremic.  Consultants ID  Procedures None  Assessment and Plan: Sepsis secondary to High grade Strept sanguinis bacteremia  Multifocal pneumonia with associated acute hypoxic respiratory failure with SpO2 79% per chart  Sepsis pathophysiology resolved.  Hypoxia resolved. Continue ceftriaxone   ID appreciated. TTE noted, no vegetations seen TEE to rule out endocarditis    Acute on chronic diastolic congestive heart failure Acute systolic CHF Elevated BNP on admission.  Echocardiogram February 2023 showed grade 2 diastolic dysfunction.  Appears euvolemic.  Not on diuretics as outpatient.  Monitor volume status. Continue aspirin, carvedilol Transthoracic echocardiogram shows decreased EF compared to previous study February 2023 with LVEF 35-40%, global hypokinesis left ventricle.  Right ventricular systolic function low normal.  Pericardial valve well-seated. Appears euvolemic. Will ask cardiology to see 6/24. No other antihypertensives or CHF meds currently.   Stable liver hypodensity, and complex renal cyst Consider outpatient MRI    Aortic atherosclerosis and coronary artery calcifications. Can resume statin on discharge.      Subjective:  Feels fine  Physical Exam: Vitals:   07/30/22 1318 07/30/22 2042 07/31/22 0453 07/31/22 1203  BP: 98/60 101/76 115/60 114/61  Pulse: 70 76 68 100  Resp: 20 20 17 20  Temp: 98 F (36.7 C) 97.8 F (36.6  C) 97.6 F (36.4 C) 98.1 F (36.7 C)  TempSrc: Oral Oral Oral Oral  SpO2: 93% 99% 96% 98%  Weight:      Height:       Physical Exam Vitals reviewed.  Constitutional:      General: He is not in acute distress.    Appearance: He is not ill-appearing or toxic-appearing.  Cardiovascular:     Rate and Rhythm: Normal rate and regular rhythm.     Heart sounds: No murmur heard. Pulmonary:     Effort: Pulmonary effort is normal. No respiratory distress.     Breath sounds: No wheezing, rhonchi or rales.  Neurological:     Mental Status: He is alert.  Psychiatric:        Mood and Affect: Mood normal.        Behavior: Behavior normal.     Data Reviewed: Repeat BC NGTD  Family Communication: none  Disposition: Status is: Inpatient Remains inpatient appropriate because: awaiting TEE  Planned Discharge Destination: Home    Time spent: 20 minutes  Author: Shelia Kingsberry, MD 07/31/2022 6:06 PM  For on call review [email or web address].  

## 2022-07-31 NOTE — Progress Notes (Signed)
Occupational Therapy Treatment Patient Details Name: [name] MRN: [id number] DOB: November 09, 1940 Today's Date: 07/31/2022   History of present illness [name] is a 82 y.o. male who presented from home [facility], ILF/ALF) on 07/28/22 with sepsis from Streptococcal bacteremia secondary to  Multifocal pneumonia with associated acute hypoxic respiratory failure as well as acute on chronic CHF.  Pt had recently been admitted last month with low back pain and constipation and found to have low sodium 117.  Recent (May) MRI lumbar spine is notable for artifact versus cord edema at T11-T12.  PMHx: CAD status post CABG in 2019, bioprosthetic aortic valve replacement, cardiomyopathy with recovered EF and gout.   OT comments  Patient was noted to make progress towards goals with patient able to complete LB dressing with reduced assistance. Patient continues to need A with transfers with session focused on sequencing to make patients transfers in and out of chair smoother. Patient continues to need min guard with cues for proper carryover of transfers for safety. Patient's discharge plan remains appropriate at this time. OT will continue to follow acutely.     Recommendations for follow up therapy are one component of a multi-disciplinary discharge planning process, led by the attending physician.  Recommendations may be updated based on patient status, additional functional criteria and insurance authorization.    Assistance Recommended at Discharge Intermittent Supervision/Assistance  Patient can return home with the following  A little help with bathing/dressing/bathroom;A little help with walking and/or transfers;Assistance with cooking/housework   Equipment Recommendations  None recommended by OT       Precautions / Restrictions Precautions Precautions: Fall Restrictions Weight Bearing Restrictions: No       Mobility Bed Mobility               General bed mobility  comments: patient was up in bathroom at start of session and transitioned to recliner at end of session.           Balance Overall balance assessment: Needs assistance Sitting-balance support: Feet supported, No upper extremity supported Sitting balance-Leahy Scale: Good     Standing balance support: No upper extremity supported Standing balance-Leahy Scale: Fair           ADL either performed or assessed with clinical judgement   ADL Overall ADL's : Needs assistance/impaired                       Lower Body Dressing Details (indicate cue type and reason): patient was able to participate in figure four positioning of BLE to adjust socks sitting in recliner with increased time. Toilet Transfer: Cueing for sequencing;Cueing for safety;Min guard;Ambulation;Rolling walker (2 wheels) Toilet Transfer Details (indicate cue type and reason): patient needed min A for standing from comode with posterior LOB. Toileting- Clothing Manipulation and Hygiene: Maximal assistance Toileting - Clothing Manipulation Details (indicate cue type and reason): patient walked away from commode prior to attempting to complete hygiene tasks with noted bright red discharge in commode with tiny stool and some urine. nurse made aware.              Cognition Arousal/Alertness: Awake/alert Behavior During Therapy: WFL for tasks assessed/performed Overall Cognitive Status: Within Functional Limits for tasks assessed          Exercises Other Exercises Other Exercises: patient participated in sit to stands with noted choppy transition of hands to RW when patient does not have min guard from therapist. patient was educated on slowing movements and how  to make them more smooth. patient appears to overthink transitions differently with each attempt. patient completed 10 sit to stands with supervision to min guard.  with RW Other Exercises: patient completed chair push ups to work on slowing down  landing into chair with patietn able to complete 5 reps x2 sets with increased time.            Pertinent Vitals/ Pain       Pain Assessment Pain Assessment: Faces Faces Pain Scale: Hurts little more Pain Location: back Pain Descriptors / Indicators: Grimacing, Discomfort Pain Intervention(s): Limited activity within patient's tolerance, Monitored during session, Repositioned         Frequency  Min 1X/week        Progress Toward Goals  OT Goals(current goals can now be found in the care plan section)  Progress towards OT goals: Progressing toward goals     Plan Discharge plan remains appropriate       AM-PAC OT "6 Clicks" Daily Activity     Outcome Measure   Help from another person eating meals?: None Help from another person taking care of personal grooming?: A Little Help from another person toileting, which includes using toliet, bedpan, or urinal?: A Little Help from another person bathing (including washing, rinsing, drying)?: A Little Help from another person to put on and taking off regular upper body clothing?: A Little Help from another person to put on and taking off regular lower body clothing?: A Little 6 Click Score: 19    End of Session Equipment Utilized During Treatment: Gait belt;Rolling walker (2 wheels)  OT Visit Diagnosis: Muscle weakness (generalized) (M62.81)   Activity Tolerance Patient tolerated treatment well   Patient Left in chair;with call bell/phone within reach;with family/visitor present   Nurse Communication Mobility status        Time: 1443-1500 OT Time Calculation (min): 17 min  Charges: OT General Charges $OT Visit: 1 Visit OT Treatments $Self Care/Home Management : 8-22 mins  Rosalio Loud, MS Acute Rehabilitation Department Office# [phone]   Selinda Flavin 07/31/2022, 3:45 PM

## 2022-07-31 NOTE — Progress Notes (Signed)
  Progress Note   Patient: [name] MVH:[id number] DOB: Apr 06, 1940 DOA: 07/28/2022     3 DOS: the patient was seen and examined on 07/31/2022   Brief hospital course: 82 year old man from independent living, PMH including diastolic CHF, aortic valve replacement, recent admission for hyponatremia, presented with fever and respiratory distress requiring 8 L high flow nasal cannula, noted to be tachycardic and febrile.  Admitted for sepsis secondary to multifocal pneumonia, acute on chronic diastolic CHF.  Subsequently found to be bacteremic.  Consultants ID  Procedures None  Assessment and Plan: Sepsis secondary to High grade Strept sanguinis bacteremia  Multifocal pneumonia with associated acute hypoxic respiratory failure with SpO2 79% per chart  Sepsis pathophysiology resolved.  Hypoxia resolved. Continue ceftriaxone   ID appreciated. TTE noted, no vegetations seen TEE to rule out endocarditis    Acute on chronic diastolic congestive heart failure Acute systolic CHF Elevated BNP on admission.  Echocardiogram February 2023 showed grade 2 diastolic dysfunction.  Appears euvolemic.  Not on diuretics as outpatient.  Monitor volume status. Continue aspirin, carvedilol Transthoracic echocardiogram shows decreased EF compared to previous study February 2023 with LVEF 35-40%, global hypokinesis left ventricle.  Right ventricular systolic function low normal.  Pericardial valve well-seated. Appears euvolemic. Will ask cardiology to see 6/24. No other antihypertensives or CHF meds currently.   Stable liver hypodensity, and complex renal cyst Consider outpatient MRI    Aortic atherosclerosis and coronary artery calcifications. Can resume statin on discharge.      Subjective:  Feels fine  Physical Exam: Vitals:   07/30/22 1318 07/30/22 2042 07/31/22 0453 07/31/22 1203  BP: 98/60 101/76 115/60 114/61  Pulse: 70 76 68 100  Resp: 20 20 17 20   Temp: 98 F (36.7 C) 97.8 F (36.6  C) 97.6 F (36.4 C) 98.1 F (36.7 C)  TempSrc: Oral Oral Oral Oral  SpO2: 93% 99% 96% 98%  Weight:      Height:       Physical Exam Vitals reviewed.  Constitutional:      General: He is not in acute distress.    Appearance: He is not ill-appearing or toxic-appearing.  Cardiovascular:     Rate and Rhythm: Normal rate and regular rhythm.     Heart sounds: No murmur heard. Pulmonary:     Effort: Pulmonary effort is normal. No respiratory distress.     Breath sounds: No wheezing, rhonchi or rales.  Neurological:     Mental Status: He is alert.  Psychiatric:        Mood and Affect: Mood normal.        Behavior: Behavior normal.     Data Reviewed: Repeat BC NGTD  Family Communication: none  Disposition: Status is: Inpatient Remains inpatient appropriate because: awaiting TEE  Planned Discharge Destination: Home    Time spent: 20 minutes  Author: Brendia Sacks, MD 07/31/2022 6:06 PM  For on call review [email or web address].

## 2022-08-01 ENCOUNTER — Inpatient Hospital Stay (HOSPITAL_COMMUNITY): Payer: Medicare PPO

## 2022-08-01 ENCOUNTER — Inpatient Hospital Stay (HOSPITAL_COMMUNITY): Payer: Medicare PPO | Admitting: Anesthesiology

## 2022-08-01 ENCOUNTER — Encounter (HOSPITAL_COMMUNITY): Payer: Self-pay | Admitting: Internal Medicine

## 2022-08-01 ENCOUNTER — Encounter (HOSPITAL_COMMUNITY)
Admission: EM | Disposition: A | Discharge status: Home Health | Payer: Self-pay | Source: Skilled Nursing Facility | Attending: Family Medicine

## 2022-08-01 DIAGNOSIS — I110 Hypertensive heart disease with heart failure: Secondary | ICD-10-CM

## 2022-08-01 DIAGNOSIS — I509 Heart failure, unspecified: Secondary | ICD-10-CM

## 2022-08-01 DIAGNOSIS — R7881 Bacteremia: Secondary | ICD-10-CM | POA: Diagnosis not present

## 2022-08-01 DIAGNOSIS — I350 Nonrheumatic aortic (valve) stenosis: Secondary | ICD-10-CM | POA: Diagnosis not present

## 2022-08-01 DIAGNOSIS — I5033 Acute on chronic diastolic (congestive) heart failure: Secondary | ICD-10-CM | POA: Diagnosis not present

## 2022-08-01 DIAGNOSIS — I340 Nonrheumatic mitral (valve) insufficiency: Secondary | ICD-10-CM

## 2022-08-01 DIAGNOSIS — T82897A Other specified complication of cardiac prosthetic devices, implants and grafts, initial encounter: Secondary | ICD-10-CM

## 2022-08-01 DIAGNOSIS — D649 Anemia, unspecified: Secondary | ICD-10-CM

## 2022-08-01 DIAGNOSIS — I5021 Acute systolic (congestive) heart failure: Secondary | ICD-10-CM | POA: Diagnosis not present

## 2022-08-01 DIAGNOSIS — A409 Streptococcal sepsis, unspecified: Secondary | ICD-10-CM | POA: Diagnosis not present

## 2022-08-01 DIAGNOSIS — I429 Cardiomyopathy, unspecified: Secondary | ICD-10-CM

## 2022-08-01 DIAGNOSIS — Z87891 Personal history of nicotine dependence: Secondary | ICD-10-CM

## 2022-08-01 DIAGNOSIS — T826XXA Infection and inflammatory reaction due to cardiac valve prosthesis, initial encounter: Secondary | ICD-10-CM | POA: Diagnosis not present

## 2022-08-01 DIAGNOSIS — I5022 Chronic systolic (congestive) heart failure: Secondary | ICD-10-CM

## 2022-08-01 DIAGNOSIS — I38 Endocarditis, valve unspecified: Secondary | ICD-10-CM | POA: Diagnosis not present

## 2022-08-01 DIAGNOSIS — J189 Pneumonia, unspecified organism: Secondary | ICD-10-CM

## 2022-08-01 DIAGNOSIS — I358 Other nonrheumatic aortic valve disorders: Secondary | ICD-10-CM | POA: Insufficient documentation

## 2022-08-01 DIAGNOSIS — I2510 Atherosclerotic heart disease of native coronary artery without angina pectoris: Secondary | ICD-10-CM

## 2022-08-01 DIAGNOSIS — Z951 Presence of aortocoronary bypass graft: Secondary | ICD-10-CM

## 2022-08-01 DIAGNOSIS — B955 Unspecified streptococcus as the cause of diseases classified elsewhere: Secondary | ICD-10-CM | POA: Diagnosis not present

## 2022-08-01 HISTORY — PX: TEE WITHOUT CARDIOVERSION: SHX5443

## 2022-08-01 LAB — ECHO TEE
AR max vel: 1.16 cm2
AV Area VTI: 1.19 cm2
AV Area mean vel: 1.06 cm2
AV Mean grad: 30.5 mmHg
AV Peak grad: 43.7 mmHg
Ao pk vel: 3.31 m/s
MV M vel: 5.84 m/s
MV Peak grad: 136.4 mmHg
Radius: 0.7 cm
S' Lateral: 4.2 cm

## 2022-08-01 LAB — BRAIN NATRIURETIC PEPTIDE: B Natriuretic Peptide: 1133 pg/mL — ABNORMAL HIGH (ref 0.0–100.0)

## 2022-08-01 LAB — CULTURE, BLOOD (ROUTINE X 2)

## 2022-08-01 SURGERY — ECHOCARDIOGRAM, TRANSESOPHAGEAL
Anesthesia: Monitor Anesthesia Care

## 2022-08-01 MED ORDER — LOSARTAN POTASSIUM 25 MG PO TABS
25.0000 mg | ORAL_TABLET | Freq: Every evening | ORAL | Status: DC
  Administered 2022-08-01 – 2022-08-02 (×2): 25 mg via ORAL
  Filled 2022-08-01 (×2): qty 1

## 2022-08-01 MED ORDER — METOPROLOL SUCCINATE ER 25 MG PO TB24
12.5000 mg | ORAL_TABLET | Freq: Every day | ORAL | Status: DC
  Administered 2022-08-02 – 2022-08-03 (×2): 12.5 mg via ORAL
  Filled 2022-08-01 (×2): qty 1

## 2022-08-01 MED ORDER — EPHEDRINE SULFATE-NACL 50-0.9 MG/10ML-% IV SOSY
PREFILLED_SYRINGE | INTRAVENOUS | Status: DC | PRN
  Administered 2022-08-01: 5 mg via INTRAVENOUS

## 2022-08-01 MED ORDER — LOSARTAN POTASSIUM 25 MG PO TABS: 25.0000 mg | ORAL_TABLET | Freq: Every day | ORAL | Status: DC

## 2022-08-01 MED ORDER — PROPOFOL 10 MG/ML IV BOLUS
INTRAVENOUS | Status: DC | PRN
  Administered 2022-08-01 (×2): 50 mg via INTRAVENOUS

## 2022-08-01 MED ORDER — EMPAGLIFLOZIN 10 MG PO TABS
10.0000 mg | ORAL_TABLET | Freq: Every day | ORAL | Status: DC
  Administered 2022-08-01 – 2022-08-03 (×3): 10 mg via ORAL
  Filled 2022-08-01 (×3): qty 1

## 2022-08-01 MED ORDER — PROPOFOL 500 MG/50ML IV EMUL
INTRAVENOUS | Status: DC | PRN
  Administered 2022-08-01: 100 ug/kg/min via INTRAVENOUS

## 2022-08-01 NOTE — Anesthesia Preprocedure Evaluation (Signed)
Anesthesia Evaluation  Patient identified by MRN, date of birth, ID band Patient awake    Reviewed: Allergy & Precautions, NPO status , Patient's Chart, lab work & pertinent test results  Airway Mallampati: II  TM Distance: >3 FB Neck ROM: Full    Dental no notable dental hx. (+) Teeth Intact, Dental Advisory Given, Poor Dentition   Pulmonary neg pulmonary ROS, former smoker   Pulmonary exam normal breath sounds clear to auscultation       Cardiovascular hypertension, Pt. on medications + CAD, + CABG and +CHF  + Valvular Problems/Murmurs MR  Rhythm:Regular Rate:Normal + Systolic murmurs S/P AVR  ECHO 6/24 1. Left ventricular ejection fraction, by estimation, is 35 to 40%. The  left ventricle has moderately decreased function. The left ventricle  demonstrates global hypokinesis. Left ventricular diastolic function could  not be evaluated.   2. Right ventricular systolic function is low normal. The right  ventricular size is normal.   3. Left atrial size was severely dilated.   4. Right atrial size was mild to moderately dilated.   5. The mitral valve is degenerative. Mild to moderate mitral valve  regurgitation.   6. 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   7. There is mild dilatation of the ascending aorta, measuring 41 mm.   8. The inferior vena cava is normal in size with greater than 50%  respiratory variability, suggesting right atrial pressure of 3 mmHg.   9. Rhythm strip during this exam demonstrates normal sinus rhythm and  sinus bradycardia.     Neuro/Psych negative neurological ROS  negative psych ROS   GI/Hepatic negative GI ROS, Neg liver ROS,,,  Endo/Other   negative endocrine ROS    Renal/GU negative Renal ROS  negative genitourinary   Musculoskeletal  (+) Arthritis ,    Abdominal   Peds negative pediatric ROS (+)  Hematology  (+) Blood dyscrasia, anemia   Anesthesia Other Findings   Reproductive/Obstetrics negative OB ROS                              Anesthesia Physical Anesthesia Plan  ASA: 3  Anesthesia Plan: MAC   Post-op Pain Management:  Regional for Post-op pain and Minimal or no pain anticipated   Induction: Intravenous  PONV Risk Score and Plan: 2 and Propofol infusion  Airway Management Planned: Simple Face Mask and Natural Airway  Additional Equipment:   Intra-op Plan:   Post-operative Plan:   Informed Consent: I have reviewed the patients History and Physical, chart, labs and discussed the procedure including the risks, benefits and alternatives for the proposed anesthesia with the patient or authorized representative who has indicated his/her understanding and acceptance.     Dental advisory given  Plan Discussed with: CRNA and Anesthesiologist  Anesthesia Plan Comments: (for evaluation of possible endocarditis. Patient was found to have strep sanguinis bacteremia. 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

## 2022-08-01 NOTE — Transfer of Care (Signed)
Immediate Anesthesia Transfer of Care Note  Patient: [name]  Procedure(s) Performed: TRANSESOPHAGEAL ECHOCARDIOGRAM  Patient Location: PACU and Cath Lab  Anesthesia Type:MAC  Level of Consciousness: drowsy  Airway & Oxygen Therapy: Patient Spontanous Breathing and Patient connected to nasal cannula oxygen  Post-op Assessment: Report given to RN and Post -op Vital signs reviewed and stable  Post vital signs: Reviewed and stable  Last Vitals:  Vitals Value Taken Time  BP 94/79 08/01/22 1351  Temp    Pulse 64 08/01/22 1352  Resp 18 08/01/22 1352  SpO2 99 % 08/01/22 1352  Vitals shown include unvalidated device data.  Last Pain:  Vitals:   08/01/22 1302  TempSrc:   PainSc: 0-No pain      Patients Stated Pain Goal: 0 (07/28/22 1600)  Complications: No notable events documented.

## 2022-08-01 NOTE — Progress Notes (Signed)
Dr. Crenshaw at bedside talking w/patient. 

## 2022-08-01 NOTE — Anesthesia Postprocedure Evaluation (Signed)
Anesthesia Post Note  Patient: [name]  Procedure(s) Performed: TRANSESOPHAGEAL ECHOCARDIOGRAM     Patient location during evaluation: PACU Anesthesia Type: MAC Level of consciousness: awake and alert Pain management: pain level controlled Vital Signs Assessment: post-procedure vital signs reviewed and stable Respiratory status: spontaneous breathing, nonlabored ventilation, respiratory function stable and patient connected to nasal cannula oxygen Cardiovascular status: stable and blood pressure returned to baseline Postop Assessment: no apparent nausea or vomiting Anesthetic complications: no   No notable events documented.  Last Vitals:  Vitals:   08/01/22 1430 08/01/22 1530  BP: 126/76 138/79  Pulse: 60 (!) 59  Resp: 15 20  Temp:  36.6 C  SpO2: 100% 98%    Last Pain:  Vitals:   08/01/22 1530  TempSrc: Oral  PainSc:                  Kennisha Qin

## 2022-08-01 NOTE — Progress Notes (Signed)
Physical Therapy Treatment Patient Details Name: [name] MRN: [id number] DOB: 1940/07/29 Today's Date: 08/01/2022   History of Present Illness 82 y.o. male who presented from home [facility], [facility]) on 07/28/22 with sepsis from Streptococcal bacteremia secondary to  Multifocal pneumonia with associated acute hypoxic respiratory failure as well as acute on chronic CHF.  Pt had recently been admitted last month with low back pain and constipation and found to have low sodium 117.  Recent (May) MRI lumbar spine is notable for artifact versus cord edema at T11-T12.  PMHx: CAD status post CABG in 2019, bioprosthetic aortic valve replacement, cardiomyopathy with recovered EF and gout.    PT Comments    Pt agreeable to working with therapy. He was pleased to be OOB and he felt his walking was much better than last time. He still exhibits some weakness, especially with sit<>stand transitions. Will continue to follow and progress activity as tolerated. He reports he has some testing planned for today.     Recommendations for follow up therapy are one component of a multi-disciplinary discharge planning process, led by the attending physician.  Recommendations may be updated based on patient status, additional functional criteria and insurance authorization.  Follow Up Recommendations       Assistance Recommended at Discharge Intermittent Supervision/Assistance  Patient can return home with the following A little help with walking and/or transfers;A little help with bathing/dressing/bathroom;Help with stairs or ramp for entrance;Assistance with cooking/housework   Equipment Recommendations  Rolling walker (2 wheels)    Recommendations for Other Services       Precautions / Restrictions Precautions Precautions: Fall Restrictions Weight Bearing Restrictions: No     Mobility  Bed Mobility Overal bed mobility: Needs Assistance Bed Mobility: Supine to Sit, Sit to Supine      Supine to sit: Supervision, HOB elevated Sit to supine: Supervision, HOB elevated   General bed mobility comments: Increased time and effort for pt. No assistance required on today    Transfers Overall transfer level: Needs assistance Equipment used: Rolling walker (2 wheels) Transfers: Sit to/from Stand Sit to Stand: Min guard, From elevated surface           General transfer comment: Increased time and effort-multiple attempts for pt to be able to rise from standard heigth surface. Cues for safety, technique, hand placement. Poor safety with sit<>stand transitions (pt preferred to hang onto walker instead of placing hand on bed)    Ambulation/Gait Ambulation/Gait assistance: Min guard Gait Distance (Feet): 450 Feet Assistive device: Rolling walker (2 wheels) Gait Pattern/deviations: Step-through pattern, Decreased stride length       General Gait Details: Slow but steady pace with RW. Pt denies back spasms or pain at this time and reports being happy to be OOB. Several brief standing rest breaks taken intermittently.   Stairs             Wheelchair Mobility    Modified Rankin (Stroke Patients Only)       Balance Overall balance assessment: Needs assistance         Standing balance support: Bilateral upper extremity supported, Reliant on assistive device for balance, During functional activity Standing balance-Leahy Scale: Fair                              Cognition Arousal/Alertness: Awake/alert Behavior During Therapy: WFL for tasks assessed/performed Overall Cognitive Status: Within Functional Limits for tasks assessed  Exercises      General Comments        Pertinent Vitals/Pain Pain Assessment Pain Assessment: Faces Faces Pain Scale: Hurts a little bit Pain Location: back Pain Descriptors / Indicators: Discomfort Pain Intervention(s): Limited activity within  patient's tolerance, Monitored during session, Repositioned    Home Living                          Prior Function            PT Goals (current goals can now be found in the care plan section) Progress towards PT goals: Progressing toward goals    Frequency    Min 1X/week      PT Plan Current plan remains appropriate    Co-evaluation              AM-PAC PT "6 Clicks" Mobility   Outcome Measure  Help needed turning from your back to your side while in a flat bed without using bedrails?: A Little Help needed moving from lying on your back to sitting on the side of a flat bed without using bedrails?: A Little Help needed moving to and from a bed to a chair (including a wheelchair)?: A Little Help needed standing up from a chair using your arms (e.g., wheelchair or bedside chair)?: A Little Help needed to walk in hospital room?: A Little Help needed climbing 3-5 steps with a railing? : A Lot 6 Click Score: 17    End of Session Equipment Utilized During Treatment: Gait belt Activity Tolerance: Patient tolerated treatment well Patient left: with call bell/phone within reach;with bed alarm set   PT Visit Diagnosis: Difficulty in walking, not elsewhere classified (R26.2)     Time: 4098-1191 PT Time Calculation (min) (ACUTE ONLY): 16 min  Charges:  $Gait Training: 8-22 mins                        Faye Ramsay, PT Acute Rehabilitation  Office: [phone]

## 2022-08-01 NOTE — Consult Note (Signed)
CARDIOLOGY CONSULT NOTE  Patient ID: [name] MRN: [id number] DOB/AGE: July 14, 1940 82 y.o.  Admit date: 07/28/2022 Attending physician: Standley Brooking, MD Primary Physician:  Caesar Bookman, NP Outpatient Cardiology Provider: Dr. Truett Mainland Inpatient Cardiologist: Tessa Lerner, DO, [facility]  Reason of consultation: Cardiomyopathy/CHF Referring physician: Standley Brooking, MD  Chief complaint: Fever and shortness of breath  HPI:  [name] is a 82 y.o. 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   However, on 07/28/2022 presents to the hospital with chief complaint of fevers and shortness of breath from his retirement community called [place].  On the day of arrival patient was experiencing fevers, severe shortness of breath, respiratory distress, requiring 8 L of high flow nasal cannula oxygen.  Given the recent hospitalization and presentation rules in for sepsis criteria and was started on treatment for HCAP given his recent hospitalization.  Blood cultures were obtained which was positive for Streptococcus bacteremia and urine specimen was also abnormal.  Given his cardiovascular history echocardiogram was performed for CHF indication which notes reduction in LVEF compared to prior study and cardiology was consulted this morning for cardiomyopathy and heart failure management.  It appears the patient is already scheduled with [facility] for TEE later today for endocarditis evaluation given his bacteremia.  Right now he denies  anginal chest pain or heart failure symptoms.  Patient states that since May 2024 his overall functional capacity has been limited due to what he describes as back spasms.  Otherwise he hopes to be discharged to his retirement community after the completion of TEE.  ALLERGIES: Allergies  Allergen Reactions   Sulfamethoxazole-Trimethoprim Rash    PAST MEDICAL HISTORY: 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    HFrEF (heart failure with reduced ejection fraction) (HCC)    Hypertension    Moderate mitral regurgitation    Severe aortic stenosis     PAST SURGICAL HISTORY: Past Surgical History:  Procedure Laterality Date   AORTIC VALVE REPLACEMENT N/A 07/24/2017   Procedure: AORTIC VALVE REPLACEMENT (AVR) using 23mm Inspiris Aortic Valve;  Surgeon: Alleen Borne, MD;  Location: [facility] OR;  Service: Open Heart Surgery;  Laterality: N/A;   CLIPPING OF ATRIAL APPENDAGE N/A 07/24/2017   Procedure: CLIPPING OF ATRIAL APPENDAGE using a 50mm Atricure clip;  Surgeon: Alleen Borne, MD;  Location: [facility] OR;  Service: Open Heart Surgery;  Laterality: N/A;   CORONARY ARTERY BYPASS GRAFT N/A 07/24/2017   Procedure: CORONARY ARTERY BYPASS GRAFTING (CABG) times 3 using the left greater saphenous vein harvested endoscopically and left internal mammary artery.;  Surgeon: Alleen Borne, MD;  Location: [facility] OR;  Service: Open Heart Surgery;  Laterality: N/A;   EYE SURGERY Bilateral    lens replacements for cataracts   HERNIA REPAIR     INGUINAL HERNIA REPAIR Right 05/06/2015   Procedure: LAPAROSCOPIC RIGHT INGUINAL HERNIA WITH MESH;  Surgeon: Abigail Miyamoto, MD;  Location: [facility] ORS;  Service: General;  Laterality: Right;   INSERTION OF MESH Right 05/06/2015   Procedure: INSERTION OF MESH;  Surgeon: Abigail Miyamoto, MD;  Location: [facility] ORS;  Service: General;  Laterality:  Right;   RIGHT/LEFT HEART CATH AND CORONARY ANGIOGRAPHY N/A 07/10/2017   Procedure: RIGHT/LEFT HEART CATH AND CORONARY ANGIOGRAPHY;  Surgeon: Elder Negus, MD;  Location: [facility] INVASIVE CV LAB;  Service: Cardiovascular;  Laterality: N/A;   TEE WITHOUT CARDIOVERSION N/A 07/11/2017   Procedure: TRANSESOPHAGEAL ECHOCARDIOGRAM (TEE);  Surgeon: Elder Negus, MD;  Location: [facility] ENDOSCOPY;  Service: Cardiovascular;  Laterality: N/A;   TEE WITHOUT CARDIOVERSION N/A 07/24/2017   Procedure: TRANSESOPHAGEAL ECHOCARDIOGRAM (TEE);  Surgeon: Alleen Borne, MD;  Location: [facility] OR;  Service: Open Heart Surgery;  Laterality: N/A;   TOTAL KNEE ARTHROPLASTY Right 08/07/2019   Procedure: TOTAL KNEE ARTHROPLASTY;  Surgeon: Ollen Gross, MD;  Location: [facility] ORS;  Service: Orthopedics;  Laterality: Right;   ULTRASOUND GUIDANCE FOR VASCULAR ACCESS  07/10/2017   Procedure: Ultrasound Guidance For Vascular Access;  Surgeon: Elder Negus, MD;  Location: [facility] INVASIVE CV LAB;  Service: Cardiovascular;;    FAMILY HISTORY: The patient's family history includes Heart attack in his father; Stroke in his mother.   SOCIAL HISTORY:  The patient  reports that he quit smoking about 51 years ago. His smoking use included cigarettes. He has a 12.00 pack-year smoking history. He has never used smokeless tobacco. He reports current alcohol use. He reports current drug use. Drug: Marijuana.  MEDICATIONS: Current Outpatient Medications  Medication Instructions   acetaminophen (TYLENOL) 1,000 mg, Oral, Every 6 hours PRN   ascorbic acid (VITAMIN C) 500 mg, Oral, Daily   aspirin EC 81 mg, Oral, Daily, Swallow whole.    carvedilol (COREG) 3.125 mg, Oral, 2 times daily   diclofenac Sodium (VOLTAREN) 2 g, Topical, 4 times daily, To low back   lactulose (CEPHULAC) 20 g, Oral, 3 times daily PRN   lidocaine (LIDODERM) 5 % 1 patch, Transdermal, Every 24 hours, Remove & Discard patch within 12 hours or as directed by MD   Multiple  Vitamin (MULTIVITAMIN ADULT PO) 1 tablet, Oral, Daily   Myrbetriq 25 mg, Oral, Daily   Omega-3 Fatty Acids (FISH OIL) 1000 MG CAPS 2 capsules, Oral, Daily   silodosin (RAPAFLO) 8 mg, Oral, Daily   ZyrTEC Allergy 10 mg, Oral, Daily PRN    REVIEW OF SYSTEMS: Review of Systems  Cardiovascular:  Negative for chest pain, claudication, dyspnea on exertion, irregular heartbeat, leg swelling, near-syncope, orthopnea, palpitations, paroxysmal nocturnal dyspnea and syncope.  Respiratory:  Negative for shortness of breath.   Hematologic/Lymphatic: Negative for bleeding problem.  Musculoskeletal:  Negative for muscle cramps and myalgias.  Neurological:  Negative for dizziness and light-headedness.    PHYSICAL EXAMINATION: PHYSICAL EXAM: Temp:  [97.7 F (36.5 C)-98.3 F (36.8 C)] 97.8 F (36.6 C) (06/24 1530) Pulse Rate:  [54-89] 59 (06/24 1530) Resp:  [14-21] 20 (06/24 1530) BP: (104-138)/(58-79) 138/79 (06/24 1530) SpO2:  [94 %-100 %] 98 % (06/24 1530)  Intake/Output:  Intake/Output Summary (Last 24 hours) at 08/01/2022 1707 Last data filed at 08/01/2022 0900 Gross per 24 hour  Intake 450.79 ml  Output 1575 ml  Net -1124.21 ml     Net IO Since Admission: -8,646.14 mL [08/01/22 1707]  Weights:     07/28/2022    1:00 PM 07/28/2022    2:11 AM 07/19/2022    3:47 PM  Last 3 Weights  Weight (lbs) 162  lb 4.1 oz 165 lb 171 lb 15.3 oz  Weight (kg) 73.6 kg 74.844 kg 78 kg     Physical Exam  Constitutional: No distress.  Age appropriate, hemodynamically stable.   Neck: No JVD present.  Cardiovascular: Normal rate, regular rhythm, S1 normal, S2 normal, intact distal pulses and normal pulses. Exam reveals no gallop, no S3 and no S4.  Murmur heard. Holosystolic murmur is present with a grade of 3/6 at the apex. Pulmonary/Chest: Effort normal and breath sounds normal. No stridor. He has no wheezes. He has no rales.  Abdominal: Soft. Bowel sounds are normal. He exhibits no distension. There  is no abdominal tenderness.  Musculoskeletal:        General: No edema.     Cervical back: Neck supple.  Neurological: He is alert and oriented to person, place, and time. He has intact cranial nerves (2-12).  Skin: Skin is warm and moist.    LAB RESULTS: Chemistry Recent Labs  Lab 07/28/22 0209 07/29/22 0313 07/30/22 0915  NA 129* 129* 131*  K 4.1 4.0 3.8  CL 97* 98 94*  CO2 23 24 28   GLUCOSE 145* 133* 113*  BUN 29* 33* 27*  CREATININE 0.84 0.83 1.01  CALCIUM 8.2* 8.3* 8.7*  PROT 5.9*  --   --   ALBUMIN 2.6*  --   --   AST 28  --   --   ALT 32  --   --   ALKPHOS 64  --   --   BILITOT 1.0  --   --   GFRNONAA >60 >60 >60  ANIONGAP 9 7 9     Hematology Recent Labs  Lab 07/28/22 0209 07/29/22 0313  WBC 10.5 12.0*  RBC 2.95* 2.82*  HGB 9.1* 8.6*  HCT 27.6* 26.2*  MCV 93.6 92.9  MCH 30.8 30.5  MCHC 33.0 32.8  RDW 13.6 13.4  PLT 121* 124*   High Sensitivity Troponin:  No results for input(s): "TROPONINIHS" in the last 720 hours.   Cardiac EnzymesNo results for input(s): "TROPONINI" in the last 168 hours. No results for input(s): "TROPIPOC" in the last 168 hours.  BNP Recent Labs  Lab 07/28/22 0209  BNP 1,615.2*    DDimer No results for input(s): "DDIMER" in the last 168 hours.  Hemoglobin A1c:  Lab Results  Component Value Date   HGBA1C 5.7 (H) 07/20/2017   MPG 116.89 07/20/2017   TSH  Recent Labs    09/20/21 1048 03/23/22 1031 06/27/22 1102  TSH 1.63 1.39 2.416   Lipid Panel  Lab Results  Component Value Date   CHOL 173 03/23/2022   HDL 73 03/23/2022   LDLCALC 86 03/23/2022   TRIG 62 03/23/2022   CHOLHDL 2.4 03/23/2022   Drugs of Abuse  No results found for: "LABOPIA", "COCAINSCRNUR", "LABBENZ", "AMPHETMU", "THCU", "LABBARB"    CARDIAC DATABASE: Cardiac surgery 07/24/2017: CABG (LIMA-LAD, SVG- dLCx, SVG-RCA) Aortic valve replacement 23 mm Edwards Inspiris Resilia pericardial valve LAA clipping  EKG: 07/28/2022: Sinus tachycardia, 108  bpm, PVC, without underlying injury pattern.  Echocardiogram: 03/16/2021: Left ventricle cavity is normal in size and wall thickness. Abnormal septal wall motion due to post-operative valve. Normal LV systolic function with visual EF 50-55%. Doppler evidence of grade II (pseudonormal) diastolic dysfunction, elevated LAP.  Left atrial cavity is moderately dilated. Well seated bioprosthetic aortic valve. Mildly elevated mean PG 13 mmHg. No aortic valve regurgitation. Mild mitral valve leaflet thickening. Moderate (Grade II) mitral regurgitation. Mild tricuspid regurgitation.  Mild pulmonic regurgitation. No  evidence of pulmonary hypertension. No significant change compared to previous study on 02/21/2020.  07/28/2022:  1. Left ventricular ejection fraction, by estimation, is 35 to 40%. The left ventricle has moderately decreased function. The left ventricle  demonstrates global hypokinesis. Left ventricular diastolic function could not be evaluated.   2. Right ventricular systolic function is low normal. The right ventricular size is normal.   3. Left atrial size was severely dilated.   4. Right atrial size was mild to moderately dilated.   5. The mitral valve is degenerative. Mild to moderate mitral valve  regurgitation.   6. S/p 23 mm Edwards Inspiris Resilia pericardial valve, well seated, no valvular or perivalvular regurgitation, suggestive of mild prosthetic aortic valve stenosis (peak velocity 3.72m/s, MG , DI 0.32, AT >147msec) visually leaflet thickness is  normal and mobility is preserved.   7. There is mild dilatation of the ascending aorta, measuring 41 mm.   8. The inferior vena cava is normal in size with greater than 50% respiratory variability, suggesting right atrial pressure of 3 mmHg.   9. Rhythm strip during this exam demonstrates normal sinus rhythm and sinus bradycardia.   TEE 06/24/024:  1. Oscillating density on prosthetic aortic valve consistent with vegetation.    2. Left ventricular ejection fraction, by estimation, is 45 to 50%. The  left ventricle has mildly decreased function. The left ventricle  demonstrates global hypokinesis.   3. Right ventricular systolic function is normal. The right ventricular size is normal.   4. LAA known to be ligated. Left atrial size was severely dilated.   5. Right atrial size was mildly dilated.   6. The mitral valve is normal in structure. Moderate mitral valve regurgitation.   7. The aortic valve has been repaired/replaced. Aortic valve regurgitation is not visualized. Moderate aortic valve stenosis. There is  a 23 mm Edwards inspiris resilia pericardial valve present in the aortic position.   8. There is mild (Grade II) plaque involving the descending aorta.   Scheduled Meds:  aspirin EC  81 mg Oral Daily   Chlorhexidine Gluconate Cloth  6 each Topical Daily   docusate sodium  100 mg Oral BID   empagliflozin  10 mg Oral Daily   enoxaparin (LOVENOX) injection  40 mg Subcutaneous Q24H   lidocaine  1 patch Transdermal Q24H   loratadine  10 mg Oral Daily   losartan  25 mg Oral Daily   [START ON 08/02/2022] metoprolol succinate  12.5 mg Oral Daily   mirabegron ER  25 mg Oral Daily   mupirocin ointment  1 Application Nasal BID   mouth rinse  15 mL Mouth Rinse 4 times per day   tamsulosin  0.4 mg Oral QPC supper    Continuous Infusions:  cefTRIAXone (ROCEPHIN)  IV Stopped (07/31/22 2023)    PRN Meds: acetaminophen **OR** acetaminophen, albuterol, ondansetron **OR** ondansetron (ZOFRAN) IV, mouth rinse, polyethylene glycol, traZODone  IMPRESSION & RECOMMENDATIONS: Santonio Speakman is a 82 y.o. 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 Hyperlipidemia, mixed  Plan:  Given the patient's presentation he ruled in  for sepsis criteria with the underlying etiology likely being HCAP versus UTI.  He underwent blood cultures which came back positive for streptococcal bacteremia and was on antibiotics.  His presentation of acute onset of shortness of breath, hypoxia, requiring 8 L nasal nasal cannula oxygen, elevated BNP was suggestive of HFpEF exacerbation as well.  Transthoracic echocardiogram noted a reduction in LVEF from 50-55% to 35-40%.  In addition, left atrium is severely dilated, mild/moderate MR, and mild prosthetic aortic valve stenosis, estimated RAP 3 mmHg.  Given the change in LVEF and patient's presentation cardiology was consulted today for heart failure/cardiomyopathy management.  Clinically he is euvolemic, echo notes RAP of , no JVP on examination. Will discontinue carvedilol and start Toprol-XL 12.5 mg p.o. daily.  Start Jardiance 10 mg p.o. daily.  And losartan 10 mg p.o. pm.  Will uptitrate GDMT as lab work and blood pressures allow.  Will add BNP today morning's labs.  Of note, over the weekend it appears that [facility] was consulted for TEE given the bacteremia.  He is already scheduled for TEE later today and to avoid last-minute changes/confusion will continue the procedure as scheduled with [facility] colleagues.  Patient and attending hour after morning rounds and they are thankful.  Prior to completion of the note patient did undergo TEE which notes prosthetic aortic valve endocarditis.  Images personally reviewed very evident on the parasternal long axis of the aortic valve.  Primary team has consulted CT  surgery and infectious disease is awaiting on negative cultures prior to PICC line placement.  Will defer anemia workup to primary team.  Will continue to follow the patient with you during his hospitalization.   Total encounter time 63 minutes. *Total Encounter Time as defined by the Centers for Medicare and Medicaid Services includes, in addition to the face-to-face time of a patient visit (documented in the note above) non-face-to-face time: obtaining and reviewing outside history, ordering and reviewing medications, tests or procedures, care coordination (communications with other health care professionals or caregivers) and documentation in the medical record.  Patient's questions and concerns were addressed to his satisfaction. He voices understanding of the instructions provided during this encounter.   This note was created using a voice recognition software as a result there may be grammatical errors inadvertently enclosed that do not reflect the nature of this encounter. Every attempt is made to correct such errors.  Delilah Shan [facility]  Pager:  [phone] Office: [phone] 08/01/2022, 5:07 PM

## 2022-08-01 NOTE — Progress Notes (Addendum)
[facility] Infectious Diseases Follow Up Note  Patient Identification: Patient Name: [name] MRN: [id number] Admit Date: 07/28/2022  2:09 AM Age: 82 y.o.Today's Date: 08/01/2022  Reason for Visit: fu on strep bacteremia   Principal Problem:   Sepsis (HCC) Active Problems:   S/P AVR   Acute hypoxic respiratory failure (HCC)   Streptococcal bacteremia   Multifocal pneumonia   Acute on chronic diastolic CHF (congestive heart failure) (HCC)   Aortic atherosclerosis (HCC)   Medication management   Bacteremia   Acute systolic CHF (congestive heart failure) (HCC)  Antibiotics:  Vancomycin 6/19-6/20 Cefepime 6/19-6/21, ceftriaxone 6/21-c   Lines/Hardware: rt TKA, hernia mesh  Interval Events: continues to be afebrile,  6/22 repeat blood cx NG in 2 days   Assessment 82 year old male with multiple co-morbidities including CAD, CHF, moderate MR, severe aortic stenosis s/p CABG, clipping of atrial appendage and prosthetic aortic valve replacement in 07/24/2017 admitted with acute onset SOB, fevers. Found to have    # Strep sanguinis Prosthetic AV endocarditis     Repeat blood cx 6/22 NG in 2 days     TEE 6/24 Oscillating density on prosthetic aortic valve consistent with  vegetation. Moderate AV stenosis    CT Hazy diffuse ground-glass opacities in the lungs bilaterally, greater on the right than on the left, possible edema versus multifocal pneeumonia. Small bilateral pleural effusions with compressive atelectasis.   # Acute on chronic CHF Elevated BNP  TEE as above  IV diuretics per primary   Recommendations Continue ceftriaxone. Will check susceptibility of aminoglycoside Fu repeat blood cultures for clearance  Needs PICC once repeat blood cx are negative in 3 days  CTVS to review TEE findings for +/- need for surgical intervention. D/w primary  Monitor CBC and CMP on abtx  Following  Rest of the management as per  the primary team. Thank you for the consult. Please page with pertinent questions or concerns.  ______________________________________________________________________ Subjective patient seen and examined at the bedside. Doing well with no complaints, asking about TEE  Vitals BP 126/71   Pulse 67   Temp 97.7 F (36.5 C) (Temporal)   Resp 19   Ht 5\' 11"  (1.803 m)   Wt 73.6 kg   SpO2 96%   BMI 22.63 kg/m     Physical Exam Constitutional:  adult male sitting in the bed and appears comfortable     Comments:   Cardiovascular:     Rate and Rhythm: Normal rate and regular rhythm.     Heart sounds:   Pulmonary:     Effort: Pulmonary effort is normal.     Comments:   Abdominal:     Palpations: Abdomen is soft.     Tenderness: non distended   Musculoskeletal:        General: No swelling or tenderness.   Skin:    Comments: No rashes   Neurological: awake, alert and oriented, following commands   Psychiatric:        Mood and Affect: Mood normal.   Pertinent Microbiology Results for orders placed or performed during the hospital encounter of 07/28/22  Culture, blood (Routine x 2)     Status: Abnormal   Collection Time: 07/28/22  2:12 AM   Specimen: BLOOD  Result Value Ref Range Status   Specimen Description   Final    BLOOD RIGHT ANTECUBITAL Performed at [facility], [address] [place], [place] [address]    Special Requests   Final    BOTTLES DRAWN  AEROBIC AND ANAEROBIC Blood Culture adequate volume Performed at [facility], [address] [place], [place] [address]    Culture  Setup Time   Final    GRAM POSITIVE COCCI IN BOTH AEROBIC AND ANAEROBIC BOTTLES CRITICAL VALUE NOTED.  VALUE IS CONSISTENT WITH PREVIOUSLY REPORTED AND CALLED VALUE.    Culture (A)  Final    STREPTOCOCCUS SANGUINIS SUSCEPTIBILITIES PERFORMED ON PREVIOUS CULTURE WITHIN THE LAST 5 DAYS. Performed at [facility], [address],  [place], [place] [address]    Report Status 07/30/2022 FINAL  Final  Culture, blood (Routine x 2)     Status: Abnormal   Collection Time: 07/28/22  3:04 AM   Specimen: BLOOD LEFT FOREARM  Result Value Ref Range Status   Specimen Description   Final    BLOOD LEFT FOREARM Performed at [facility], [address] [place], [place] [address]    Special Requests   Final    BOTTLES DRAWN AEROBIC AND ANAEROBIC Blood Culture adequate volume Performed at [facility], [address], [place], [place] [address]    Culture  Setup Time   Final    GRAM POSITIVE COCCI IN CHAINS IN BOTH AEROBIC AND ANAEROBIC BOTTLES CRITICAL RESULT CALLED TO, READ BACK BY AND VERIFIED WITH: L POINDEXTER,[email or web address]  07/29/22 MK Performed at [facility], [address], [place], [place] [address]    Culture STREPTOCOCCUS SANGUINIS (A)  Final   Report Status 07/30/2022 FINAL  Final   Organism ID, Bacteria STREPTOCOCCUS SANGUINIS  Final      Susceptibility   Streptococcus sanguinis - MIC*    PENICILLIN 0.12 SENSITIVE Sensitive     CEFTRIAXONE <=0.12 SENSITIVE Sensitive     ERYTHROMYCIN <=0.12 SENSITIVE Sensitive     LEVOFLOXACIN 1 SENSITIVE Sensitive     VANCOMYCIN 0.5 SENSITIVE Sensitive     * STREPTOCOCCUS SANGUINIS  Blood Culture ID Panel (Reflexed)     Status: Abnormal   Collection Time: 07/28/22  3:04 AM  Result Value Ref Range Status   Enterococcus faecalis NOT DETECTED NOT DETECTED Final   Enterococcus Faecium NOT DETECTED NOT DETECTED Final   Listeria monocytogenes NOT DETECTED NOT DETECTED Final   Staphylococcus species NOT DETECTED NOT DETECTED Final   Staphylococcus aureus (BCID) NOT DETECTED NOT DETECTED Final   Staphylococcus epidermidis NOT DETECTED NOT DETECTED Final   Staphylococcus lugdunensis NOT DETECTED NOT DETECTED Final   Streptococcus species DETECTED (A) NOT DETECTED Final    Comment: Not Enterococcus species, Streptococcus agalactiae, Streptococcus pyogenes,  or Streptococcus pneumoniae. CRITICAL RESULT CALLED TO, READ BACK BY AND VERIFIED WITH: L POINDEXTER,[email or web address]  07/29/22 MK    Streptococcus agalactiae NOT DETECTED NOT DETECTED Final   Streptococcus pneumoniae NOT DETECTED NOT DETECTED Final   Streptococcus pyogenes NOT DETECTED NOT DETECTED Final   A.calcoaceticus-baumannii NOT DETECTED NOT DETECTED Final   Bacteroides fragilis NOT DETECTED NOT DETECTED Final   Enterobacterales NOT DETECTED NOT DETECTED Final   Enterobacter cloacae complex NOT DETECTED NOT DETECTED Final   Escherichia coli NOT DETECTED NOT DETECTED Final   Klebsiella aerogenes NOT DETECTED NOT DETECTED Final   Klebsiella oxytoca NOT DETECTED NOT DETECTED Final   Klebsiella pneumoniae NOT DETECTED NOT DETECTED Final   Proteus species NOT DETECTED NOT DETECTED Final   Salmonella species NOT DETECTED NOT DETECTED Final   Serratia marcescens NOT DETECTED NOT DETECTED Final   Haemophilus influenzae NOT DETECTED NOT DETECTED Final   Neisseria meningitidis NOT DETECTED NOT DETECTED Final  Pseudomonas aeruginosa NOT DETECTED NOT DETECTED Final   Stenotrophomonas maltophilia NOT DETECTED NOT DETECTED Final   Candida albicans NOT DETECTED NOT DETECTED Final   Candida auris NOT DETECTED NOT DETECTED Final   Candida glabrata NOT DETECTED NOT DETECTED Final   Candida krusei NOT DETECTED NOT DETECTED Final   Candida parapsilosis NOT DETECTED NOT DETECTED Final   Candida tropicalis NOT DETECTED NOT DETECTED Final   Cryptococcus neoformans/gattii NOT DETECTED NOT DETECTED Final    Comment: Performed at [facility], [address] [place], [place] [address]  Resp panel by RT-PCR (RSV, Flu A&B, Covid) Peripheral     Status: None   Collection Time: 07/28/22  3:07 AM   Specimen: Peripheral; Nasal Swab  Result Value Ref Range Status   SARS Coronavirus 2 by RT PCR NEGATIVE NEGATIVE Final    Comment: (NOTE) SARS-CoV-2 target nucleic acids are NOT DETECTED.  The SARS-CoV-2  RNA is generally detectable in upper respiratory specimens during the acute phase of infection. The lowest concentration of SARS-CoV-2 viral copies this assay can detect is 138 copies/mL. A negative result does not preclude SARS-Cov-2 infection and should not be used as the sole basis for treatment or other patient management decisions. A negative result may occur with  improper specimen collection/handling, submission of specimen other than nasopharyngeal swab, presence of viral mutation(s) within the areas targeted by this assay, and inadequate number of viral copies(<138 copies/mL). A negative result must be combined with clinical observations, patient history, and epidemiological information. The expected result is Negative.  Fact Sheet for Patients:  BloggerCourse.com  Fact Sheet for Healthcare Providers:  SeriousBroker.it  This test is no t yet approved or cleared by the [place] FDA and  has been authorized for detection and/or diagnosis of SARS-CoV-2 by FDA under an Emergency Use Authorization (EUA). This EUA will remain  in effect (meaning this test can be used) for the duration of the COVID-19 declaration under Section 564(b)(1) of the Act, 21 U.S.C.section 360bbb-3(b)(1), unless the authorization is terminated  or revoked sooner.       Influenza A by PCR NEGATIVE NEGATIVE Final   Influenza B by PCR NEGATIVE NEGATIVE Final    Comment: (NOTE) The Xpert Xpress SARS-CoV-2/FLU/RSV plus assay is intended as an aid in the diagnosis of influenza from Nasopharyngeal swab specimens and should not be used as a sole basis for treatment. Nasal washings and aspirates are unacceptable for Xpert Xpress SARS-CoV-2/FLU/RSV testing.  Fact Sheet for Patients: BloggerCourse.com  Fact Sheet for Healthcare Providers: SeriousBroker.it  This test is not yet approved or cleared by the  [place] FDA and has been authorized for detection and/or diagnosis of SARS-CoV-2 by FDA under an Emergency Use Authorization (EUA). This EUA will remain in effect (meaning this test can be used) for the duration of the COVID-19 declaration under Section 564(b)(1) of the Act, 21 U.S.C. section 360bbb-3(b)(1), unless the authorization is terminated or revoked.     Resp Syncytial Virus by PCR NEGATIVE NEGATIVE Final    Comment: (NOTE) Fact Sheet for Patients: BloggerCourse.com  Fact Sheet for Healthcare Providers: SeriousBroker.it  This test is not yet approved or cleared by the [place] FDA and has been authorized for detection and/or diagnosis of SARS-CoV-2 by FDA under an Emergency Use Authorization (EUA). This EUA will remain in effect (meaning this test can be used) for the duration of the COVID-19 declaration under Section 564(b)(1) of the Act, 21 U.S.C. section 360bbb-3(b)(1), unless the authorization is terminated or revoked.  Performed at [facility], [address] [place], [place] [address]   Urine Culture     Status: Abnormal   Collection Time: 07/28/22  5:56 AM   Specimen: Urine, Random  Result Value Ref Range Status   Specimen Description   Final    URINE, RANDOM Performed at [facility], [address], [place], [place] [address]    Special Requests   Final    NONE Reflexed from [id number] Performed at [facility], [address] [place], [place] [address]    Culture (A)  Final    <10,000 COLONIES/mL INSIGNIFICANT GROWTH Performed at [facility], [address] [place], [place] [address]    Report Status 07/29/2022 FINAL  Final  MRSA Next Gen by PCR, Nasal     Status: Abnormal   Collection Time: 07/28/22 12:51 PM   Specimen: Nasal Mucosa; Nasal Swab  Result Value Ref Range Status   MRSA by PCR Next Gen DETECTED (A) NOT DETECTED Final     Comment: (NOTE) The GeneXpert MRSA Assay (FDA approved for NASAL specimens only), is one component of a comprehensive MRSA colonization surveillance program. It is not intended to diagnose MRSA infection nor to guide or monitor treatment for MRSA infections. Test performance is not FDA approved in patients less than 23 years old. Performed at [facility], [address] [place], [place] [address]   Culture, blood (Routine X 2) w Reflex to ID Panel     Status: None (Preliminary result)   Collection Time: 07/30/22  1:44 AM   Specimen: BLOOD LEFT ARM  Result Value Ref Range Status   Specimen Description   Final    BLOOD LEFT ARM BOTTLES DRAWN AEROBIC AND ANAEROBIC Performed at [facility], [address] [place], [place] [address]    Special Requests   Final    Blood Culture adequate volume Performed at [facility], [address] [place], [place] [address]    Culture   Final    NO GROWTH 2 DAYS Performed at [facility], [address] [place], [place] [address]    Report Status PENDING  Incomplete  Culture, blood (Routine X 2) w Reflex to ID Panel     Status: None (Preliminary result)   Collection Time: 07/30/22  1:44 AM   Specimen: BLOOD LEFT HAND  Result Value Ref Range Status   Specimen Description   Final    BLOOD LEFT HAND BOTTLES DRAWN AEROBIC AND ANAEROBIC Performed at [facility], [address], [place], [place] [address]    Special Requests   Final    Blood Culture adequate volume Performed at [facility], [address], [place], [place] [address]    Culture   Final    NO GROWTH 2 DAYS Performed at [facility], [address], [place], [place] [address]    Report Status PENDING  Incomplete    Pertinent Lab.    Latest Ref Rng & Units 07/29/2022    3:13 AM 07/28/2022    2:09 AM 07/19/2022    4:56 PM  CBC  WBC 4.0 - 10.5 K/uL 12.0  10.5  7.7    Hemoglobin 13.0 - 17.0 g/dL 8.6  9.1  9.3   Hematocrit 39.0 - 52.0 % 26.2  27.6  28.2   Platelets 150 - 400 K/uL 124  121  214       Latest  Ref Rng & Units 07/30/2022    9:15 AM 07/29/2022    3:13 AM 07/28/2022    2:09 AM  CMP  Glucose 70 - 99 mg/dL 213  086  578   BUN 8 - 23 mg/dL 27  33  29   Creatinine 0.61 - 1.24 mg/dL 4.69  6.29  5.28   Sodium 135 - 145 mmol/L 131  129  129   Potassium 3.5 - 5.1 mmol/L 3.8  4.0  4.1   Chloride 98 - 111 mmol/L 94  98  97   CO2 22 - 32 mmol/L 28  24  23    Calcium 8.9 - 10.3 mg/dL 8.7  8.3  8.2   Total Protein 6.5 - 8.1 g/dL   5.9   Total Bilirubin 0.3 - 1.2 mg/dL   1.0   Alkaline Phos 38 - 126 U/L   64   AST 15 - 41 U/L   28   ALT 0 - 44 U/L   32      Pertinent Imaging today Plain films and CT images have been personally visualized and interpreted; radiology reports have been reviewed. Decision making incorporated into the Impression  ECHO TEE  Result Date: 08/01/2022    TRANSESOPHOGEAL ECHO REPORT   Patient Name:   [name] Date of Exam: 08/01/2022 Medical Rec #:  [id number]      Height:       71.0 in Accession #:    [id number]     Weight:       162.3 lb Date of Birth:  [birth year]      BSA:          1.929 m Patient Age:    81 years       BP:           127/70 mmHg Patient Gender: M              HR:           62 bpm. Exam Location:  Inpatient Procedure: Transesophageal Echo, 3D Echo, Color Doppler and Cardiac Doppler Indications:     Bacteremia  History:         Patient has prior history of Echocardiogram examinations, most                  recent 07/28/2022. CHF, CAD, Prior CABG and LAA clipping; Risk                  Factors:Hypertension and Dyslipidemia. 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                  Aortic Valve: 23 mm Edwards inspiris resilia pericardial valve                  is present in the aortic position.  Sonographer:     Irving Burton Senior RDCS Referring Phys:  [id number] Perlie Gold Diagnosing Phys: Olga Millers  MD PROCEDURE: After discussion of the risks and benefits of a TEE, an informed consent was obtained from the patient. The transesophogeal probe was passed without difficulty through the esophogus of the patient. Sedation performed by different physician. The patient was monitored while under deep sedation. Anesthestetic sedation was provided intravenously by Anesthesiology: 210mg  of Propofol. The patient developed no complications during the procedure.  IMPRESSIONS  1. Oscillating density on prosthetic aortic valve consistent with vegetation.  2. Left ventricular  ejection fraction, by estimation, is 45 to 50%. The left ventricle has mildly decreased function. The left ventricle demonstrates global hypokinesis.  3. Right ventricular systolic function is normal. The right ventricular size is normal.  4. LAA known to be ligated. Left atrial size was severely dilated.  5. Right atrial size was mildly dilated.  6. The mitral valve is normal in structure. Moderate mitral valve regurgitation.  7. The aortic valve has been repaired/replaced. Aortic valve regurgitation is not visualized. Moderate aortic valve stenosis. There is a 23 mm Edwards inspiris resilia pericardial valve present in the aortic position.  8. There is mild (Grade II) plaque involving the descending aorta. FINDINGS  Left Ventricle: Left ventricular ejection fraction, by estimation, is 45 to 50%. The left ventricle has mildly decreased function. The left ventricle demonstrates global hypokinesis. The left ventricular internal cavity size was normal in size. Right Ventricle: The right ventricular size is normal. Right ventricular systolic function is normal. Left Atrium: LAA known to be ligated. Left atrial size was severely dilated. Right Atrium: Right atrial size was mildly dilated. Pericardium: There is no evidence of pericardial effusion. Mitral Valve: The mitral valve is normal in structure. Moderate mitral valve regurgitation. Tricuspid Valve: The  tricuspid valve is normal in structure. Tricuspid valve regurgitation is mild. Aortic Valve: The aortic valve has been repaired/replaced. Aortic valve regurgitation is not visualized. Moderate aortic stenosis is present. Aortic valve mean gradient measures 30.5 mmHg. Aortic valve peak gradient measures 43.7 mmHg. Aortic valve area,  by VTI measures 1.19 cm. There is a 23 mm Edwards inspiris resilia pericardial valve present in the aortic position. Pulmonic Valve: The pulmonic valve was not well visualized. Pulmonic valve regurgitation is trivial. Aorta: The aortic root is normal in size and structure. There is mild (Grade II) plaque involving the descending aorta. IAS/Shunts: No atrial level shunt detected by color flow Doppler. Additional Comments: Oscillating density on prosthetic aortic valve consistent with vegetation. Spectral Doppler performed. LEFT VENTRICLE PLAX 2D LVIDd:         5.50 cm LVIDs:         4.20 cm LV PW:         0.50 cm LVOT diam:     2.20 cm LV SV:         97 LV SV Index:   50 LVOT Area:     3.80 cm  AORTIC VALVE AV Area (Vmax):    1.16 cm AV Area (Vmean):   1.06 cm AV Area (VTI):     1.19 cm AV Vmax:           330.50 cm/s AV Vmean:          267.500 cm/s AV VTI:            0.814 m AV Peak Grad:      43.7 mmHg AV Mean Grad:      30.5 mmHg LVOT Vmax:         101.00 cm/s LVOT Vmean:        74.600 cm/s LVOT VTI:          0.254 m LVOT/AV VTI ratio: 0.31 MR Peak grad:    136.4 mmHg MR Mean grad:    90.0 mmHg    SHUNTS MR Vmax:         584.00 cm/s  Systemic VTI:  0.25 m MR Vmean:        454.0 cm/s   Systemic Diam: 2.20 cm MR PISA:  3.08 cm MR PISA Eff ROA: 19 mm MR PISA Radius:  0.70 cm Olga Millers MD Electronically signed by Olga Millers MD Signature Date/Time: 08/01/2022/2:32:22 PM    Final    EP STUDY  Result Date: 08/01/2022 See surgical note for result.    I have personally spent 54 minutes involved in face-to-face and non-face-to-face activities for this patient on the  day of the visit. Professional time spent includes the following activities: Preparing to see the patient (review of tests), Obtaining and/or reviewing separately obtained history (admission/discharge record), Performing a medically appropriate examination and/or evaluation , Ordering medications/tests/procedures, referring and communicating with other health care professionals, Documenting clinical information in the EMR, Independently interpreting results (not separately reported), Communicating results to the patient/family/caregiver, Counseling and educating the patient/family/caregiver and Care coordination (not separately reported).   Plan d/w requesting provider as well as ID pharm D  Note: This document was prepared using dragon voice recognition software and may include unintentional dictation errors.   Electronically signed by:   Odette Fraction, MD Infectious Disease Physician [facility] Pager: [phone]

## 2022-08-01 NOTE — Care Management Important Message (Signed)
Important Message  Patient Details IM Letter given. Name: Mainor Hellmann MRN: [id number] Date of Birth: 26-Sep-1940   Medicare Important Message Given:  Yes     Caren Macadam 08/01/2022, 11:11 AM

## 2022-08-01 NOTE — Progress Notes (Signed)
  Progress Note   Patient: [name] BJY:[id number] DOB: May 21, 1940 DOA: 07/28/2022     4 DOS: the patient was seen and examined on 08/01/2022   Brief hospital course: 82 year old man from independent living, PMH including diastolic CHF, aortic valve replacement, recent admission for hyponatremia, presented with fever and respiratory distress requiring 8 L high flow nasal cannula, noted to be tachycardic and febrile.  Admitted for sepsis secondary to multifocal pneumonia, acute on chronic diastolic CHF.  Subsequently found to be bacteremic.  Consultants ID  Procedures None  Assessment and Plan: Sepsis secondary to High grade Strept sanguinis bacteremia  Multifocal pneumonia with associated acute hypoxic respiratory failure with SpO2 79% per chart  Aortic valve endocarditis Sepsis pathophysiology resolved.  Hypoxia resolved. Continue ceftriaxone   ID appreciated. TTE noted, no vegetations seen TEE showed vegetation on prosthetic aortic valve  AVR  Endocarditis bioprosthetic valve 23 mm Edwards bioprosthetic pericardial valve and clipping of the left atrial appendage on 07/24/2017 by Dr. Laneta Simmers TEE: Oscillating density on prosthetic aortic valve consistent with  vegetation.  T CTS consultation   Acute on chronic diastolic congestive heart failure Acute systolic CHF Elevated BNP on admission.  Echocardiogram February 2023 showed grade 2 diastolic dysfunction.  Appears euvolemic.  Not on diuretics as outpatient.  Monitor volume status. Continue aspirin, carvedilol Transthoracic echocardiogram shows decreased EF compared to previous study February 2023 with LVEF 35-40%, global hypokinesis left ventricle.  Right ventricular systolic function low normal.  Pericardial valve well-seated. Appears euvolemic. Discussed with Dr. Odis Hollingshead.  He will follow-up as an outpatient.   Stable liver hypodensity, and complex renal cyst Consider outpatient MRI    Aortic atherosclerosis and coronary  artery calcifications. Can resume statin on discharge.      Subjective:  Feels fine No complaints  Physical Exam: Vitals:   08/01/22 1410 08/01/22 1415 08/01/22 1430 08/01/22 1530  BP: 116/74 116/70 126/76 138/79  Pulse: (!) 57 (!) 58 60 (!) 59  Resp: 18 17 15 20   Temp:    97.8 F (36.6 C)  TempSrc:    Oral  SpO2: 99% 100% 100% 98%  Weight:      Height:       Physical Exam Vitals reviewed.  Constitutional:      General: He is not in acute distress.    Appearance: He is not ill-appearing or toxic-appearing.  Cardiovascular:     Rate and Rhythm: Normal rate and regular rhythm.     Heart sounds: No murmur (3/6 holosystolic) heard. Pulmonary:     Effort: Pulmonary effort is normal. No respiratory distress.     Breath sounds: No wheezing, rhonchi or rales.  Neurological:     Mental Status: He is alert.  Psychiatric:        Mood and Affect: Mood normal.        Behavior: Behavior normal.     Data Reviewed: No new data Repeat BC from 6/22 NGTD  Family Communication: none  Disposition: Status is: Inpatient Remains inpatient appropriate because: endocarditis   Planned Discharge Destination: Home    Time spent: 35 minutes  Author: Brendia Sacks, MD 08/01/2022 4:07 PM  For on call review [email or web address].

## 2022-08-01 NOTE — Progress Notes (Signed)
     Transesophageal Echocardiogram Note  [name] [id number] [birth year]  Procedure: Transesophageal Echocardiogram Indications: Bacteremia  Procedure Details Consent: Obtained Time Out: Verified patient identification, verified procedure, site/side was marked, verified correct patient position, special equipment/implants available, Radiology Safety Procedures followed,  medications/allergies/relevent history reviewed, required imaging and test results available.  Performed  Medications:  Pt sedated by anesthesia with diprovan 110 mg IV total.  Mild global reduction in LV function (EF 45-50); severe LAE; LAA ligated; mild RAE; normal RV; s/p AVR with oscillating density noted concerning for vegetation; moderate AS (mean gradient 32 mmHg); trace AI; moderate MR; mild TR; possible PFO.    Complications: No apparent complications Patient did tolerate procedure well.  Olga Millers, MD

## 2022-08-01 NOTE — Progress Notes (Signed)
Heart Failure Navigator Progress Note  Assessed for Heart & Vascular TOC clinic readiness.  Patient does not meet criteria due to [facility] cardiology patient.   Navigator will sign off at this time.    Adryana Mogensen, BSN, RN Heart Failure Nurse Navigator Secure Chat Only   

## 2022-08-01 NOTE — Interval H&P Note (Signed)
History and Physical Interval Note:  08/01/2022 12:48 PM  [name]  has presented today for surgery, with the diagnosis of bacteremia.  The various methods of treatment have been discussed with the patient and family. After consideration of risks, benefits and other options for treatment, the patient has consented to  Procedure(s): TRANSESOPHAGEAL ECHOCARDIOGRAM (N/A) as a surgical intervention.  The patient's history has been reviewed, patient examined, no change in status, stable for surgery.  I have reviewed the patient's chart and labs.  Questions were answered to the patient's satisfaction.     Olga Millers

## 2022-08-02 ENCOUNTER — Other Ambulatory Visit: Payer: Self-pay

## 2022-08-02 ENCOUNTER — Encounter (HOSPITAL_COMMUNITY): Payer: Self-pay | Admitting: Cardiology

## 2022-08-02 DIAGNOSIS — I429 Cardiomyopathy, unspecified: Secondary | ICD-10-CM

## 2022-08-02 DIAGNOSIS — T826XXA Infection and inflammatory reaction due to cardiac valve prosthesis, initial encounter: Secondary | ICD-10-CM | POA: Diagnosis not present

## 2022-08-02 DIAGNOSIS — Z452 Encounter for adjustment and management of vascular access device: Secondary | ICD-10-CM

## 2022-08-02 DIAGNOSIS — I38 Endocarditis, valve unspecified: Secondary | ICD-10-CM

## 2022-08-02 DIAGNOSIS — A409 Streptococcal sepsis, unspecified: Secondary | ICD-10-CM | POA: Diagnosis not present

## 2022-08-02 DIAGNOSIS — B955 Unspecified streptococcus as the cause of diseases classified elsewhere: Secondary | ICD-10-CM | POA: Diagnosis not present

## 2022-08-02 DIAGNOSIS — I2510 Atherosclerotic heart disease of native coronary artery without angina pectoris: Secondary | ICD-10-CM

## 2022-08-02 DIAGNOSIS — R7881 Bacteremia: Secondary | ICD-10-CM | POA: Diagnosis not present

## 2022-08-02 DIAGNOSIS — I358 Other nonrheumatic aortic valve disorders: Secondary | ICD-10-CM | POA: Diagnosis not present

## 2022-08-02 DIAGNOSIS — I5021 Acute systolic (congestive) heart failure: Secondary | ICD-10-CM | POA: Diagnosis not present

## 2022-08-02 LAB — BASIC METABOLIC PANEL
Anion gap: 7 (ref 5–15)
BUN: 13 mg/dL (ref 8–23)
CO2: 27 mmol/L (ref 22–32)
Calcium: 8.5 mg/dL — ABNORMAL LOW (ref 8.9–10.3)
Chloride: 99 mmol/L (ref 98–111)
Creatinine, Ser: 0.84 mg/dL (ref 0.61–1.24)
GFR, Estimated: >60 mL/min (ref >60)
Glucose, Bld: 125 mg/dL — ABNORMAL HIGH (ref 70–99)
Potassium: 4.5 mmol/L (ref 3.5–5.1)
Sodium: 133 mmol/L — ABNORMAL LOW (ref 135–145)

## 2022-08-02 LAB — CBC
HCT: 27.2 % — ABNORMAL LOW (ref 39.0–52.0)
Hemoglobin: 8.5 g/dL — ABNORMAL LOW (ref 13.0–17.0)
MCH: 30.4 pg (ref 26.0–34.0)
MCHC: 31.3 g/dL (ref 30.0–36.0)
MCV: 97.1 fL (ref 80.0–100.0)
Platelets: 141 10*3/uL — ABNORMAL LOW (ref 150–400)
RBC: 2.8 MIL/uL — ABNORMAL LOW (ref 4.22–5.81)
RDW: 14.4 % (ref 11.5–15.5)
WBC: 7.6 10*3/uL (ref 4.0–10.5)
nRBC: 0 % (ref 0.0–0.2)

## 2022-08-02 MED ORDER — SODIUM CHLORIDE 0.9% FLUSH: 10.0000 mL | Freq: Two times a day (BID) | INTRAVENOUS | Status: DC

## 2022-08-02 MED ORDER — SODIUM CHLORIDE 0.9% FLUSH: 10.0000 mL | INTRAVENOUS | Status: DC | PRN

## 2022-08-02 MED ORDER — FUROSEMIDE 10 MG/ML IJ SOLN
40.0000 mg | Freq: Two times a day (BID) | INTRAMUSCULAR | Status: DC
  Administered 2022-08-03: 40 mg via INTRAVENOUS
  Filled 2022-08-02: qty 4

## 2022-08-02 MED ORDER — FUROSEMIDE 10 MG/ML IJ SOLN
20.0000 mg | Freq: Two times a day (BID) | INTRAMUSCULAR | Status: DC
  Administered 2022-08-02: 20 mg via INTRAVENOUS
  Filled 2022-08-02: qty 2

## 2022-08-02 MED ORDER — ATORVASTATIN CALCIUM 20 MG PO TABS
20.0000 mg | ORAL_TABLET | Freq: Every day | ORAL | Status: DC
  Administered 2022-08-02 – 2022-08-03 (×2): 20 mg via ORAL
  Filled 2022-08-02 (×2): qty 1

## 2022-08-02 NOTE — Progress Notes (Signed)
Peripherally Inserted Central Catheter Placement  The IV Nurse has discussed with the patient and/or persons authorized to consent for the patient, the purpose of this procedure and the potential benefits and risks involved with this procedure.  The benefits include less needle sticks, lab draws from the catheter, and the patient may be discharged home with the catheter. Risks include, but not limited to, infection, bleeding, blood clot (thrombus formation), and puncture of an artery; nerve damage and irregular heartbeat and possibility to perform a PICC exchange if needed/ordered by physician.  Alternatives to this procedure were also discussed.  Bard Power PICC patient education guide, fact sheet on infection prevention and patient information card has been provided to patient /or left at bedside.    PICC Placement Documentation  PICC Single Lumen 08/02/22 Right Brachial 38 cm 0 cm (Active)  Indication for Insertion or Continuance of Line Prolonged intravenous therapies 08/02/22 1703  Exposed Catheter (cm) 0 cm 08/02/22 1703  Site Assessment Clean, Dry, Intact 08/02/22 1703  Line Status Flushed;Saline locked;Blood return noted 08/02/22 1703  Dressing Type Transparent;Securing device 08/02/22 1703  Dressing Status Antimicrobial disc in place;Clean, Dry, Intact 08/02/22 1703  Safety Lock Not Applicable 08/02/22 1703  Line Care Connections checked and tightened 08/02/22 1703  Line Adjustment (NICU/IV Team Only) No 08/02/22 1703  Dressing Intervention New dressing;Other (Comment) 08/02/22 1703       Annett Fabian 08/02/2022, 5:04 PM

## 2022-08-02 NOTE — Progress Notes (Signed)
Progress Note  Patient Name: [name] MRN: [id number] DOB: 1940/10/01 Date of Encounter: 08/02/2022  Attending physician: Standley Brooking, MD Primary care provider: Caesar Bookman, NP Primary Cardiologist: Dr. Truett Mainland  Subjective: [name] is a 82 y.o. Caucasian male who was seen and examined at bedside  Sitting up right having breakfast No chest pain.  SOB improving  Case discussed and reviewed with his nurse.  Objective: Vital Signs in the last 24 hours: Temp:  [97.5 F (36.4 C)-97.6 F (36.4 C)] 97.6 F (36.4 C) (06/25 1256) Pulse Rate:  [65-79] 68 (06/25 1256) Resp:  [14-19] 16 (06/25 1256) BP: (122-135)/(69-76) 122/71 (06/25 1256) SpO2:  [96 %-100 %] 100 % (06/25 1256)  Intake/Output:  Intake/Output Summary (Last 24 hours) at 08/02/2022 2044 Last data filed at 08/02/2022 1700 Gross per 24 hour  Intake 840 ml  Output 2255 ml  Net -1415 ml    Net IO Since Admission: -9,941.14 mL [08/02/22 2044]  Weights:     07/28/2022    1:00 PM 07/28/2022    2:11 AM 07/19/2022    3:47 PM  Last 3 Weights  Weight (lbs) 162 lb 4.1 oz 165 lb 171 lb 15.3 oz  Weight (kg) 73.6 kg 74.844 kg 78 kg      Telemetry:  Overnight telemetry shows SR, which I personally reviewed.   Physical examination: PHYSICAL EXAM: Vitals:   08/01/22 1530 08/01/22 2118 08/02/22 0501 08/02/22 1256  BP: 138/79 123/69 135/76 122/71  Pulse: (!) 59 65 79 68  Resp: 20 14 19 16   Temp: 97.8 F (36.6 C) (!) 97.5 F (36.4 C) 97.6 F (36.4 C) 97.6 F (36.4 C)  TempSrc: Oral Oral Oral Oral  SpO2: 98% 100% 96% 100%  Weight:      Height:        Physical Exam  Constitutional: No distress.  Age appropriate, hemodynamically stable.   Neck: No JVD present.  Cardiovascular: Normal rate, regular rhythm, S1 normal, S2 normal, intact distal pulses and normal pulses. Exam reveals no gallop, no S3 and no S4.  Murmur heard. Midsystolic murmur is present with a grade of 3/6 at the upper  right sternal border. Pulmonary/Chest: Effort normal and breath sounds normal. No stridor. He has no wheezes. He has no rales.  Abdominal: Soft. Bowel sounds are normal. He exhibits no distension. There is no abdominal tenderness.  Musculoskeletal:        General: No edema.     Cervical back: Neck supple.  Neurological: He is alert and oriented to person, place, and time. He has intact cranial nerves (2-12).  Skin: Skin is warm and moist.   Lab Results: Chemistry Recent Labs  Lab 07/28/22 0209 07/29/22 0313 07/30/22 0915 08/02/22 0931  NA 129* 129* 131* 133*  K 4.1 4.0 3.8 4.5  CL 97* 98 94* 99  CO2 23 24 28 27   GLUCOSE 145* 133* 113* 125*  BUN 29* 33* 27* 13  CREATININE 0.84 0.83 1.01 0.84  CALCIUM 8.2* 8.3* 8.7* 8.5*  PROT 5.9*  --   --   --   ALBUMIN 2.6*  --   --   --   AST 28  --   --   --   ALT 32  --   --   --   ALKPHOS 64  --   --   --   BILITOT 1.0  --   --   --   GFRNONAA >60 >60 >60 >60  ANIONGAP 9 7 9  7    Hematology Recent Labs  Lab 07/28/22 0209 07/29/22 0313 08/02/22 0931  WBC 10.5 12.0* 7.6  RBC 2.95* 2.82* 2.80*  HGB 9.1* 8.6* 8.5*  HCT 27.6* 26.2* 27.2*  MCV 93.6 92.9 97.1  MCH 30.8 30.5 30.4  MCHC 33.0 32.8 31.3  RDW 13.6 13.4 14.4  PLT 121* 124* 141*   High Sensitivity Troponin:  No results for input(s): "TROPONINIHS" in the last 720 hours.   Cardiac EnzymesNo results for input(s): "TROPONINI" in the last 168 hours. No results for input(s): "TROPIPOC" in the last 168 hours.  BNP Recent Labs  Lab 07/28/22 0209 08/01/22 1859  BNP 1,615.2* 1,133.0*    DDimer No results for input(s): "DDIMER" in the last 168 hours.  Hemoglobin A1c:  Lab Results  Component Value Date   HGBA1C 5.7 (H) 07/20/2017   MPG 116.89 07/20/2017   TSH  Recent Labs    09/20/21 1048 03/23/22 1031 06/27/22 1102  TSH 1.63 1.39 2.416   Lipid Panel  Lab Results  Component Value Date   CHOL 173 03/23/2022   HDL 73 03/23/2022   LDLCALC 86 03/23/2022   TRIG  62 03/23/2022   CHOLHDL 2.4 03/23/2022   Drugs of Abuse  No results found for: "LABOPIA", "COCAINSCRNUR", "LABBENZ", "AMPHETMU", "THCU", "LABBARB"    Imaging: [place] EKG SITE RITE  Result Date: 08/02/2022 If Site Rite image not attached, placement could not be confirmed due to current cardiac rhythm.  ECHO TEE  Result Date: 08/01/2022    TRANSESOPHOGEAL ECHO REPORT   Patient Name:   [name] Date of Exam: 08/01/2022 Medical Rec #:  [id number]      Height:       71.0 in Accession #:    [id number]     Weight:       162.3 lb Date of Birth:  1940/08/08      BSA:          1.929 m Patient Age:    81 years       BP:           127/70 mmHg Patient Gender: M              HR:           62 bpm. Exam Location:  Inpatient Procedure: Transesophageal Echo, 3D Echo, Color Doppler and Cardiac Doppler Indications:     Bacteremia  History:         Patient has prior history of Echocardiogram examinations, most                  recent 07/28/2022. CHF, CAD, Prior CABG and LAA clipping; Risk                  Factors:Hypertension and Dyslipidemia. 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                  Aortic Valve: 23 mm Edwards inspiris resilia pericardial valve                  is present in the aortic position.  Sonographer:     Irving Burton Senior RDCS Referring Phys:  [id number] Perlie Gold Diagnosing Phys: Olga Millers MD PROCEDURE: After discussion of the risks and benefits of a TEE, an informed consent was obtained from the patient. The transesophogeal probe was passed without difficulty through the esophogus of the patient. Sedation performed  by different physician. The patient was monitored while under deep sedation. Anesthestetic sedation was provided intravenously by Anesthesiology: 210mg  of Propofol. The patient developed no complications during the procedure.  IMPRESSIONS  1. Oscillating density on prosthetic aortic valve consistent with vegetation.  2. Left ventricular ejection fraction, by  estimation, is 45 to 50%. The left ventricle has mildly decreased function. The left ventricle demonstrates global hypokinesis.  3. Right ventricular systolic function is normal. The right ventricular size is normal.  4. LAA known to be ligated. Left atrial size was severely dilated.  5. Right atrial size was mildly dilated.  6. The mitral valve is normal in structure. Moderate mitral valve regurgitation.  7. The aortic valve has been repaired/replaced. Aortic valve regurgitation is not visualized. Moderate aortic valve stenosis. There is a 23 mm Edwards inspiris resilia pericardial valve present in the aortic position.  8. There is mild (Grade II) plaque involving the descending aorta. FINDINGS  Left Ventricle: Left ventricular ejection fraction, by estimation, is 45 to 50%. The left ventricle has mildly decreased function. The left ventricle demonstrates global hypokinesis. The left ventricular internal cavity size was normal in size. Right Ventricle: The right ventricular size is normal. Right ventricular systolic function is normal. Left Atrium: LAA known to be ligated. Left atrial size was severely dilated. Right Atrium: Right atrial size was mildly dilated. Pericardium: There is no evidence of pericardial effusion. Mitral Valve: The mitral valve is normal in structure. Moderate mitral valve regurgitation. Tricuspid Valve: The tricuspid valve is normal in structure. Tricuspid valve regurgitation is mild. Aortic Valve: The aortic valve has been repaired/replaced. Aortic valve regurgitation is not visualized. Moderate aortic stenosis is present. Aortic valve mean gradient measures 30.5 mmHg. Aortic valve peak gradient measures 43.7 mmHg. Aortic valve area,  by VTI measures 1.19 cm. There is a 23 mm Edwards inspiris resilia pericardial valve present in the aortic position. Pulmonic Valve: The pulmonic valve was not well visualized. Pulmonic valve regurgitation is trivial. Aorta: The aortic root is normal in size  and structure. There is mild (Grade II) plaque involving the descending aorta. IAS/Shunts: No atrial level shunt detected by color flow Doppler. Additional Comments: Oscillating density on prosthetic aortic valve consistent with vegetation. Spectral Doppler performed. LEFT VENTRICLE PLAX 2D LVIDd:         5.50 cm LVIDs:         4.20 cm LV PW:         0.50 cm LVOT diam:     2.20 cm LV SV:         97 LV SV Index:   50 LVOT Area:     3.80 cm  AORTIC VALVE AV Area (Vmax):    1.16 cm AV Area (Vmean):   1.06 cm AV Area (VTI):     1.19 cm AV Vmax:           330.50 cm/s AV Vmean:          267.500 cm/s AV VTI:            0.814 m AV Peak Grad:      43.7 mmHg AV Mean Grad:      30.5 mmHg LVOT Vmax:         101.00 cm/s LVOT Vmean:        74.600 cm/s LVOT VTI:          0.254 m LVOT/AV VTI ratio: 0.31 MR Peak grad:    136.4 mmHg MR Mean grad:    90.0 mmHg  SHUNTS MR Vmax:         584.00 cm/s  Systemic VTI:  0.25 m MR Vmean:        454.0 cm/s   Systemic Diam: 2.20 cm MR PISA:         3.08 cm MR PISA Eff ROA: 19 mm MR PISA Radius:  0.70 cm Olga Millers MD Electronically signed by Olga Millers MD Signature Date/Time: 08/01/2022/2:32:22 PM    Final    EP STUDY  Result Date: 08/01/2022 See surgical note for result.   CARDIAC DATABASE: Cardiac surgery 07/24/2017: CABG (LIMA-LAD, SVG- dLCx, SVG-RCA) Aortic valve replacement 23 mm Edwards Inspiris Resilia pericardial valve LAA clipping   EKG: 07/28/2022: Sinus tachycardia, 108 bpm, PVC, without underlying injury pattern.   Echocardiogram: 03/16/2021: Left ventricle cavity is normal in size and wall thickness. Abnormal septal wall motion due to post-operative valve. Normal LV systolic function with visual EF 50-55%. Doppler evidence of grade II (pseudonormal) diastolic dysfunction, elevated LAP.  Left atrial cavity is moderately dilated. Well seated bioprosthetic aortic valve. Mildly elevated mean PG 13 mmHg. No aortic valve regurgitation. Mild mitral valve  leaflet thickening. Moderate (Grade II) mitral regurgitation. Mild tricuspid regurgitation.  Mild pulmonic regurgitation. No evidence of pulmonary hypertension. No significant change compared to previous study on 02/21/2020.   07/28/2022:  1. Left ventricular ejection fraction, by estimation, is 35 to 40%. The left ventricle has moderately decreased function. The left ventricle  demonstrates global hypokinesis. Left ventricular diastolic function could not be evaluated.   2. Right ventricular systolic function is low normal. The right ventricular size is normal.   3. Left atrial size was severely dilated.   4. Right atrial size was mild to moderately dilated.   5. The mitral valve is degenerative. Mild to moderate mitral valve  regurgitation.   6. S/p 23 mm Edwards Inspiris Resilia pericardial valve, well seated, no valvular or perivalvular regurgitation, suggestive of mild prosthetic aortic valve stenosis (peak velocity 3.47m/s, MG , DI 0.32, AT >120msec) visually leaflet thickness is  normal and mobility is preserved.   7. There is mild dilatation of the ascending aorta, measuring 41 mm.   8. The inferior vena cava is normal in size with greater than 50% respiratory variability, suggesting right atrial pressure of 3 mmHg.   9. Rhythm strip during this exam demonstrates normal sinus rhythm and sinus bradycardia.    TEE 06/24/024:  1. Oscillating density on prosthetic aortic valve consistent with vegetation.   2. Left ventricular ejection fraction, by estimation, is 45 to 50%. The  left ventricle has mildly decreased function. The left ventricle  demonstrates global hypokinesis.   3. Right ventricular systolic function is normal. The right ventricular size is normal.   4. LAA known to be ligated. Left atrial size was severely dilated.   5. Right atrial size was mildly dilated.   6. The mitral valve is normal in structure. Moderate mitral valve regurgitation.   7. The aortic valve has  been repaired/replaced. Aortic valve regurgitation is not visualized. Moderate aortic valve stenosis. There is  a 23 mm Edwards inspiris resilia pericardial valve present in the aortic position.   8. There is mild (Grade II) plaque involving the descending aorta.   Scheduled Meds:  aspirin EC  81 mg Oral Daily   atorvastatin  20 mg Oral Daily   Chlorhexidine Gluconate Cloth  6 each Topical Daily   docusate sodium  100 mg Oral BID   empagliflozin  10 mg Oral Daily  enoxaparin (LOVENOX) injection  40 mg Subcutaneous Q24H   [START ON 08/03/2022] furosemide  40 mg Intravenous BID   lidocaine  1 patch Transdermal Q24H   loratadine  10 mg Oral Daily   losartan  25 mg Oral QPM   metoprolol succinate  12.5 mg Oral Daily   mirabegron ER  25 mg Oral Daily   mupirocin ointment  1 Application Nasal BID   mouth rinse  15 mL Mouth Rinse 4 times per day   sodium chloride flush  10-40 mL Intracatheter Q12H   tamsulosin  0.4 mg Oral QPC supper    Continuous Infusions:  cefTRIAXone (ROCEPHIN)  IV 2 g (08/02/22 1202)    PRN Meds: acetaminophen **OR** acetaminophen, albuterol, ondansetron **OR** ondansetron (ZOFRAN) IV, mouth rinse, polyethylene glycol, sodium chloride flush, traZODone   IMPRESSION & RECOMMENDATIONS: [name] is a 82 y.o. 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   Impression: Cardiomyopathy, newly discovered. 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 Streptococcal bacteremia Multifocal pneumonia/HCAP. Normocytic normochromic anemia. Established CAD status post CABG without angina pectoris. Aortic atherosclerosis. Hypertension Hyperlipidemia,  mixed.  Plan: Cardiomyopathy newly discovered Compensated heart failure with reduced EF, stage C, NYHA class II Net IO Since Admission: -9,941.14 mL [08/02/22 2044] BNP levels are still elevated. Lasix 20 mg IV push x 1.   Will start Lasix 40 mg IV push twice daily starting 08/03/2022. Started on Jardiance 10 mg p.o. daily doing well. Change carvedilol to metoprolol 12.5 mg p.o. daily daily. Continue losartan 25 mg p.o. every afternoon - once diuresed and BP allows transition to ARNi If blood pressure allows will start spironolactone.  Prosthetic aortic valve endocarditis Streptococcal bacteremia Prosthetic valve aortic stenosis: Mobile vegetation noted on transesophageal echocardiogram 08/01/2022 involving the aortic valve Telemetry demonstrates normal conduction without significant dysrhythmias or pauses. Antibiotic course as per infectious disease I suspect that the prostatic valve aortic stenosis should improve if and when infection resolves. Will need repeat echo to reevaluate severity of prosthetic valve aortic stenosis.  Establish coronary disease status post CABG without angina pectoris: Aortic atherosclerosis: Continue current medical therapy. Check fasting lipids  Start home dose lipitor 20mg  po qhs  Patient's questions and concerns were addressed to his satisfaction. He voices understanding of the instructions provided during this encounter.   This note was created using a voice recognition software as a result there may be grammatical errors inadvertently enclosed that do not reflect the nature of this encounter. Every attempt is made to correct such errors.  Delilah Shan [facility]  Pager:  [phone] Office: [phone] 08/02/2022, 8:44 PM

## 2022-08-02 NOTE — Progress Notes (Incomplete)
[facility] Infectious Diseases Follow Up Note  Patient Identification: Patient Name: [name] MRN: [id number] Admit Date: 07/28/2022  2:09 AM Age: 82 y.o.Today's Date: 08/02/2022  Reason for Visit: fu on strep bacteremia   Principal Problem:   Sepsis (HCC) Active Problems:   Benign hypertension   S/P AVR   Acute hypoxic respiratory failure (HCC)   Streptococcal bacteremia   HCAP (healthcare-associated pneumonia)   Acute on chronic diastolic CHF (congestive heart failure) (HCC)   Aortic atherosclerosis (HCC)   Medication management   Bacteremia   Acute systolic CHF (congestive heart failure) (HCC)   Aortic valve endocarditis   Cardiomyopathy (HCC)   Chronic HFrEF (heart failure with reduced ejection fraction) (HCC)   Prosthetic cardiac valve vegetation   Anemia   Multifocal pneumonia  Antibiotics:  Vancomycin 6/19-6/20 Cefepime 6/19-6/21, ceftriaxone 6/21-c   Lines/Hardware: rt TKA, hernia mesh  Interval Events: continues to be afebrile,  6/22 repeat blood cx NG in 2 days   Assessment 82 year old male with multiple co-morbidities including CAD, CHF, moderate MR, severe aortic stenosis s/p CABG, clipping of atrial appendage and prosthetic aortic valve replacement in 07/24/2017 admitted with acute onset SOB, fevers. Found to have    # Strep sanguinis Prosthetic AV endocarditis     Repeat blood cx 6/22 NG in 2 days     TEE 6/24 Oscillating density on prosthetic aortic valve consistent with  vegetation. Moderate AV stenosis    CT Hazy diffuse ground-glass opacities in the lungs bilaterally, greater on the right than on the left, possible edema versus multifocal pneeumonia. Small bilateral pleural effusions with compressive atelectasis.   # Acute on chronic CHF Elevated BNP  TEE as above  IV diuretics per primary   Recommendations Continue ceftriaxone. Will check susceptibility of aminoglycoside Fu repeat blood  cultures for clearance  Needs PICC once repeat blood cx are negative in 3 days  CTVS to review TEE findings for +/- need for surgical intervention. D/w primary  Monitor CBC and CMP on abtx  Following  Rest of the management as per the primary team. Thank you for the consult. Please page with pertinent questions or concerns.  ______________________________________________________________________ Subjective patient seen and examined at the bedside. Doing well with no complaints, asking about TEE  Vitals BP 135/76 (BP Location: Right Arm)   Pulse 79   Temp 97.6 F (36.4 C) (Oral)   Resp 19   Ht 5\' 11"  (1.803 m)   Wt 73.6 kg   SpO2 96%   BMI 22.63 kg/m     Physical Exam Constitutional:  adult male sitting in the bed and appears comfortable     Comments:   Cardiovascular:     Rate and Rhythm: Normal rate and regular rhythm.     Heart sounds:   Pulmonary:     Effort: Pulmonary effort is normal.     Comments:   Abdominal:     Palpations: Abdomen is soft.     Tenderness: non distended   Musculoskeletal:        General: No swelling or tenderness.   Skin:    Comments: No rashes   Neurological: awake, alert and oriented, following commands   Psychiatric:        Mood and Affect: Mood normal.   Pertinent Microbiology Results for orders placed or performed during the hospital encounter of 07/28/22  Culture, blood (Routine x 2)     Status: Abnormal   Collection Time: 07/28/22  2:12 AM   Specimen: BLOOD  Result Value  Ref Range Status   Specimen Description   Final    BLOOD RIGHT ANTECUBITAL Performed at [facility], [address], [place], [place] [address]    Special Requests   Final    BOTTLES DRAWN AEROBIC AND ANAEROBIC Blood Culture adequate volume Performed at [facility], [address] [place], [place] [address]    Culture  Setup Time   Final    GRAM POSITIVE COCCI IN BOTH AEROBIC AND ANAEROBIC BOTTLES CRITICAL  VALUE NOTED.  VALUE IS CONSISTENT WITH PREVIOUSLY REPORTED AND CALLED VALUE.    Culture (A)  Final    STREPTOCOCCUS SANGUINIS SUSCEPTIBILITIES PERFORMED ON PREVIOUS CULTURE WITHIN THE LAST 5 DAYS. Performed at [facility], [address] [place], [place] [address]    Report Status 07/30/2022 FINAL  Final  Culture, blood (Routine x 2)     Status: Abnormal   Collection Time: 07/28/22  3:04 AM   Specimen: BLOOD LEFT FOREARM  Result Value Ref Range Status   Specimen Description   Final    BLOOD LEFT FOREARM Performed at [facility], [address] [place], [place] [address]    Special Requests   Final    BOTTLES DRAWN AEROBIC AND ANAEROBIC Blood Culture adequate volume Performed at [facility], [address] [place], [place] [address]    Culture  Setup Time   Final    GRAM POSITIVE COCCI IN CHAINS IN BOTH AEROBIC AND ANAEROBIC BOTTLES CRITICAL RESULT CALLED TO, READ BACK BY AND VERIFIED WITH: L POINDEXTER,[email or web address]  07/29/22 MK Performed at [facility], [address] [place], [place] [address]    Culture STREPTOCOCCUS SANGUINIS (A)  Final   Report Status 07/30/2022 FINAL  Final   Organism ID, Bacteria STREPTOCOCCUS SANGUINIS  Final      Susceptibility   Streptococcus sanguinis - MIC*    PENICILLIN 0.12 SENSITIVE Sensitive     CEFTRIAXONE <=0.12 SENSITIVE Sensitive     ERYTHROMYCIN <=0.12 SENSITIVE Sensitive     LEVOFLOXACIN 1 SENSITIVE Sensitive     VANCOMYCIN 0.5 SENSITIVE Sensitive     * STREPTOCOCCUS SANGUINIS  Blood Culture ID Panel (Reflexed)     Status: Abnormal   Collection Time: 07/28/22  3:04 AM  Result Value Ref Range Status   Enterococcus faecalis NOT DETECTED NOT DETECTED Final   Enterococcus Faecium NOT DETECTED NOT DETECTED Final   Listeria monocytogenes NOT DETECTED NOT DETECTED Final   Staphylococcus species NOT DETECTED NOT DETECTED Final   Staphylococcus aureus (BCID) NOT DETECTED NOT DETECTED Final    Staphylococcus epidermidis NOT DETECTED NOT DETECTED Final   Staphylococcus lugdunensis NOT DETECTED NOT DETECTED Final   Streptococcus species DETECTED (A) NOT DETECTED Final    Comment: Not Enterococcus species, Streptococcus agalactiae, Streptococcus pyogenes, or Streptococcus pneumoniae. CRITICAL RESULT CALLED TO, READ BACK BY AND VERIFIED WITH: L POINDEXTER,[email or web address]  07/29/22 MK    Streptococcus agalactiae NOT DETECTED NOT DETECTED Final   Streptococcus pneumoniae NOT DETECTED NOT DETECTED Final   Streptococcus pyogenes NOT DETECTED NOT DETECTED Final   A.calcoaceticus-baumannii NOT DETECTED NOT DETECTED Final   Bacteroides fragilis NOT DETECTED NOT DETECTED Final   Enterobacterales NOT DETECTED NOT DETECTED Final   Enterobacter cloacae complex NOT DETECTED NOT DETECTED Final   Escherichia coli NOT DETECTED NOT DETECTED Final   Klebsiella aerogenes NOT DETECTED NOT DETECTED Final   Klebsiella oxytoca NOT DETECTED NOT DETECTED Final   Klebsiella pneumoniae NOT DETECTED NOT DETECTED Final   Proteus species NOT  DETECTED NOT DETECTED Final   Salmonella species NOT DETECTED NOT DETECTED Final   Serratia marcescens NOT DETECTED NOT DETECTED Final   Haemophilus influenzae NOT DETECTED NOT DETECTED Final   Neisseria meningitidis NOT DETECTED NOT DETECTED Final   Pseudomonas aeruginosa NOT DETECTED NOT DETECTED Final   Stenotrophomonas maltophilia NOT DETECTED NOT DETECTED Final   Candida albicans NOT DETECTED NOT DETECTED Final   Candida auris NOT DETECTED NOT DETECTED Final   Candida glabrata NOT DETECTED NOT DETECTED Final   Candida krusei NOT DETECTED NOT DETECTED Final   Candida parapsilosis NOT DETECTED NOT DETECTED Final   Candida tropicalis NOT DETECTED NOT DETECTED Final   Cryptococcus neoformans/gattii NOT DETECTED NOT DETECTED Final    Comment: Performed at [facility], [address] [place], [place] [address]  Resp panel by RT-PCR (RSV, Flu A&B, Covid) Peripheral      Status: None   Collection Time: 07/28/22  3:07 AM   Specimen: Peripheral; Nasal Swab  Result Value Ref Range Status   SARS Coronavirus 2 by RT PCR NEGATIVE NEGATIVE Final    Comment: (NOTE) SARS-CoV-2 target nucleic acids are NOT DETECTED.  The SARS-CoV-2 RNA is generally detectable in upper respiratory specimens during the acute phase of infection. The lowest concentration of SARS-CoV-2 viral copies this assay can detect is 138 copies/mL. A negative result does not preclude SARS-Cov-2 infection and should not be used as the sole basis for treatment or other patient management decisions. A negative result may occur with  improper specimen collection/handling, submission of specimen other than nasopharyngeal swab, presence of viral mutation(s) within the areas targeted by this assay, and inadequate number of viral copies(<138 copies/mL). A negative result must be combined with clinical observations, patient history, and epidemiological information. The expected result is Negative.  Fact Sheet for Patients:  BloggerCourse.com  Fact Sheet for Healthcare Providers:  SeriousBroker.it  This test is no t yet approved or cleared by the [place] FDA and  has been authorized for detection and/or diagnosis of SARS-CoV-2 by FDA under an Emergency Use Authorization (EUA). This EUA will remain  in effect (meaning this test can be used) for the duration of the COVID-19 declaration under Section 564(b)(1) of the Act, 21 U.S.C.section 360bbb-3(b)(1), unless the authorization is terminated  or revoked sooner.       Influenza A by PCR NEGATIVE NEGATIVE Final   Influenza B by PCR NEGATIVE NEGATIVE Final    Comment: (NOTE) The Xpert Xpress SARS-CoV-2/FLU/RSV plus assay is intended as an aid in the diagnosis of influenza from Nasopharyngeal swab specimens and should not be used as a sole basis for treatment. Nasal washings and aspirates are  unacceptable for Xpert Xpress SARS-CoV-2/FLU/RSV testing.  Fact Sheet for Patients: BloggerCourse.com  Fact Sheet for Healthcare Providers: SeriousBroker.it  This test is not yet approved or cleared by the [place] FDA and has been authorized for detection and/or diagnosis of SARS-CoV-2 by FDA under an Emergency Use Authorization (EUA). This EUA will remain in effect (meaning this test can be used) for the duration of the COVID-19 declaration under Section 564(b)(1) of the Act, 21 U.S.C. section 360bbb-3(b)(1), unless the authorization is terminated or revoked.     Resp Syncytial Virus by PCR NEGATIVE NEGATIVE Final    Comment: (NOTE) Fact Sheet for Patients: BloggerCourse.com  Fact Sheet for Healthcare Providers: SeriousBroker.it  This test is not yet approved or cleared by the [place] FDA and has been authorized for detection and/or diagnosis of SARS-CoV-2 by FDA  under an Emergency Use Authorization (EUA). This EUA will remain in effect (meaning this test can be used) for the duration of the COVID-19 declaration under Section 564(b)(1) of the Act, 21 U.S.C. section 360bbb-3(b)(1), unless the authorization is terminated or revoked.  Performed at [facility], [address], [place], [place] [address]   Urine Culture     Status: Abnormal   Collection Time: 07/28/22  5:56 AM   Specimen: Urine, Random  Result Value Ref Range Status   Specimen Description   Final    URINE, RANDOM Performed at [facility], [address] [place], [place] [address]    Special Requests   Final    NONE Reflexed from [id number] Performed at [facility], [address], [place], [place] [address]    Culture (A)  Final    <10,000 COLONIES/mL INSIGNIFICANT GROWTH Performed at [facility], [address], [place], [place]  [address]    Report Status 07/29/2022 FINAL  Final  MRSA Next Gen by PCR, Nasal     Status: Abnormal   Collection Time: 07/28/22 12:51 PM   Specimen: Nasal Mucosa; Nasal Swab  Result Value Ref Range Status   MRSA by PCR Next Gen DETECTED (A) NOT DETECTED Final    Comment: (NOTE) The GeneXpert MRSA Assay (FDA approved for NASAL specimens only), is one component of a comprehensive MRSA colonization surveillance program. It is not intended to diagnose MRSA infection nor to guide or monitor treatment for MRSA infections. Test performance is not FDA approved in patients less than 73 years old. Performed at [facility], [address], [place], [place] [address]   Culture, blood (Routine X 2) w Reflex to ID Panel     Status: None (Preliminary result)   Collection Time: 07/30/22  1:44 AM   Specimen: BLOOD LEFT ARM  Result Value Ref Range Status   Specimen Description   Final    BLOOD LEFT ARM BOTTLES DRAWN AEROBIC AND ANAEROBIC Performed at [facility], [address], [place], [place] [address]    Special Requests   Final    Blood Culture adequate volume Performed at [facility], [address], [place], [place] [address]    Culture   Final    NO GROWTH 2 DAYS Performed at [facility], [address] [place], [place] [address]    Report Status PENDING  Incomplete  Culture, blood (Routine X 2) w Reflex to ID Panel     Status: None (Preliminary result)   Collection Time: 07/30/22  1:44 AM   Specimen: BLOOD LEFT HAND  Result Value Ref Range Status   Specimen Description   Final    BLOOD LEFT HAND BOTTLES DRAWN AEROBIC AND ANAEROBIC Performed at [facility], [address] [place], [place] [address]    Special Requests   Final    Blood Culture adequate volume Performed at [facility], [address] [place], [place] [address]    Culture   Final    NO GROWTH 2 DAYS Performed  at [facility], [address], [place], [place] [address]    Report Status PENDING  Incomplete    Pertinent Lab.    Latest Ref Rng & Units 07/29/2022    3:13 AM 07/28/2022    2:09 AM 07/19/2022    4:56 PM  CBC  WBC 4.0 - 10.5 K/uL 12.0  10.5  7.7  Hemoglobin 13.0 - 17.0 g/dL 8.6  9.1  9.3   Hematocrit 39.0 - 52.0 % 26.2  27.6  28.2   Platelets 150 - 400 K/uL 124  121  214       Latest Ref Rng & Units 07/30/2022    9:15 AM 07/29/2022    3:13 AM 07/28/2022    2:09 AM  CMP  Glucose 70 - 99 mg/dL 161  096  045   BUN 8 - 23 mg/dL 27  33  29   Creatinine 0.61 - 1.24 mg/dL 4.09  8.11  9.14   Sodium 135 - 145 mmol/L 131  129  129   Potassium 3.5 - 5.1 mmol/L 3.8  4.0  4.1   Chloride 98 - 111 mmol/L 94  98  97   CO2 22 - 32 mmol/L 28  24  23    Calcium 8.9 - 10.3 mg/dL 8.7  8.3  8.2   Total Protein 6.5 - 8.1 g/dL   5.9   Total Bilirubin 0.3 - 1.2 mg/dL   1.0   Alkaline Phos 38 - 126 U/L   64   AST 15 - 41 U/L   28   ALT 0 - 44 U/L   32      Pertinent Imaging today Plain films and CT images have been personally visualized and interpreted; radiology reports have been reviewed. Decision making incorporated into the Impression  ECHO TEE  Result Date: 08/01/2022    TRANSESOPHOGEAL ECHO REPORT   Patient Name:   [name] Date of Exam: 08/01/2022 Medical Rec #:  [id number]      Height:       71.0 in Accession #:    [id number]     Weight:       162.3 lb Date of Birth:  February 19, 1940      BSA:          1.929 m Patient Age:    81 years       BP:           127/70 mmHg Patient Gender: M              HR:           62 bpm. Exam Location:  Inpatient Procedure: Transesophageal Echo, 3D Echo, Color Doppler and Cardiac Doppler Indications:     Bacteremia  History:         Patient has prior history of Echocardiogram examinations, most                  recent 07/28/2022. CHF, CAD, Prior CABG and LAA clipping; Risk                  Factors:Hypertension and Dyslipidemia. 23mm Edwards Sapien TAVR                   placed 07/2017.                  Aortic Valve: 23 mm Edwards inspiris resilia pericardial valve                  is present in the aortic position.  Sonographer:     Irving Burton Senior RDCS Referring Phys:  [id number] Perlie Gold Diagnosing Phys: Olga Millers MD PROCEDURE: After discussion of the risks and benefits of a TEE, an informed consent was obtained from the patient. The transesophogeal probe was passed without difficulty through the esophogus of the patient. Sedation performed by different physician. The patient  was monitored while under deep sedation. Anesthestetic sedation was provided intravenously by Anesthesiology: 210mg  of Propofol. The patient developed no complications during the procedure.  IMPRESSIONS  1. Oscillating density on prosthetic aortic valve consistent with vegetation.  2. Left ventricular ejection fraction, by estimation, is 45 to 50%. The left ventricle has mildly decreased function. The left ventricle demonstrates global hypokinesis.  3. Right ventricular systolic function is normal. The right ventricular size is normal.  4. LAA known to be ligated. Left atrial size was severely dilated.  5. Right atrial size was mildly dilated.  6. The mitral valve is normal in structure. Moderate mitral valve regurgitation.  7. The aortic valve has been repaired/replaced. Aortic valve regurgitation is not visualized. Moderate aortic valve stenosis. There is a 23 mm Edwards inspiris resilia pericardial valve present in the aortic position.  8. There is mild (Grade II) plaque involving the descending aorta. FINDINGS  Left Ventricle: Left ventricular ejection fraction, by estimation, is 45 to 50%. The left ventricle has mildly decreased function. The left ventricle demonstrates global hypokinesis. The left ventricular internal cavity size was normal in size. Right Ventricle: The right ventricular size is normal. Right ventricular systolic function is normal. Left Atrium: LAA known to be ligated.  Left atrial size was severely dilated. Right Atrium: Right atrial size was mildly dilated. Pericardium: There is no evidence of pericardial effusion. Mitral Valve: The mitral valve is normal in structure. Moderate mitral valve regurgitation. Tricuspid Valve: The tricuspid valve is normal in structure. Tricuspid valve regurgitation is mild. Aortic Valve: The aortic valve has been repaired/replaced. Aortic valve regurgitation is not visualized. Moderate aortic stenosis is present. Aortic valve mean gradient measures 30.5 mmHg. Aortic valve peak gradient measures 43.7 mmHg. Aortic valve area,  by VTI measures 1.19 cm. There is a 23 mm Edwards inspiris resilia pericardial valve present in the aortic position. Pulmonic Valve: The pulmonic valve was not well visualized. Pulmonic valve regurgitation is trivial. Aorta: The aortic root is normal in size and structure. There is mild (Grade II) plaque involving the descending aorta. IAS/Shunts: No atrial level shunt detected by color flow Doppler. Additional Comments: Oscillating density on prosthetic aortic valve consistent with vegetation. Spectral Doppler performed. LEFT VENTRICLE PLAX 2D LVIDd:         5.50 cm LVIDs:         4.20 cm LV PW:         0.50 cm LVOT diam:     2.20 cm LV SV:         97 LV SV Index:   50 LVOT Area:     3.80 cm  AORTIC VALVE AV Area (Vmax):    1.16 cm AV Area (Vmean):   1.06 cm AV Area (VTI):     1.19 cm AV Vmax:           330.50 cm/s AV Vmean:          267.500 cm/s AV VTI:            0.814 m AV Peak Grad:      43.7 mmHg AV Mean Grad:      30.5 mmHg LVOT Vmax:         101.00 cm/s LVOT Vmean:        74.600 cm/s LVOT VTI:          0.254 m LVOT/AV VTI ratio: 0.31 MR Peak grad:    136.4 mmHg MR Mean grad:    90.0 mmHg    SHUNTS MR Vmax:  584.00 cm/s  Systemic VTI:  0.25 m MR Vmean:        454.0 cm/s   Systemic Diam: 2.20 cm MR PISA:         3.08 cm MR PISA Eff ROA: 19 mm MR PISA Radius:  0.70 cm Olga Millers MD Electronically signed by  Olga Millers MD Signature Date/Time: 08/01/2022/2:32:22 PM    Final    EP STUDY  Result Date: 08/01/2022 See surgical note for result.    I have personally spent 54 minutes involved in face-to-face and non-face-to-face activities for this patient on the day of the visit. Professional time spent includes the following activities: Preparing to see the patient (review of tests), Obtaining and/or reviewing separately obtained history (admission/discharge record), Performing a medically appropriate examination and/or evaluation , Ordering medications/tests/procedures, referring and communicating with other health care professionals, Documenting clinical information in the EMR, Independently interpreting results (not separately reported), Communicating results to the patient/family/caregiver, Counseling and educating the patient/family/caregiver and Care coordination (not separately reported).   Plan d/w requesting provider as well as ID pharm D  Note: This document was prepared using dragon voice recognition software and may include unintentional dictation errors.   Electronically signed by:   Odette Fraction, MD Infectious Disease Physician [facility] Pager: [phone]

## 2022-08-02 NOTE — Progress Notes (Signed)
Progress Note   Patient: [name] ZOX:[id number] DOB: 10-20-1940 DOA: 07/28/2022     5 DOS: the patient was seen and examined on 08/02/2022   Brief hospital course: 82 year old man from independent living, PMH including diastolic CHF, aortic valve replacement, recent admission for hyponatremia, presented with fever and respiratory distress requiring 8 L high flow nasal cannula, noted to be tachycardic and febrile.  Admitted for sepsis secondary to multifocal pneumonia, acute on chronic diastolic CHF.  Subsequently found to be bacteremic.  TEE revealed aortic valve vegetation.  Per cardiothoracic surgery no need for surgery.  Plan IV antibiotics per ID.  Likely home next 24 hours.  Consultants ID Cardiology  TCTS by telephone  Procedures None  Assessment and Plan: Sepsis secondary to High grade Strept sanguinis bacteremia  Aortic valve endocarditis Multifocal pneumonia with associated acute hypoxic respiratory failure with SpO2 79% per chart  Sepsis pathophysiology resolved.  Hypoxia resolved. Continue ceftriaxone per infectious disease. TEE showed vegetation on prosthetic aortic valve Fu repeat blood cultures no growth 3 days.  PICC line per ID. Plan Home with home health 6/26.   AVR  Endocarditis bioprosthetic valve 23 mm Edwards bioprosthetic pericardial valve and clipping of the left atrial appendage on 07/24/2017 by Dr. Laneta Simmers TEE: Oscillating density on prosthetic aortic valve consistent with  vegetation.  Discussed with Dr. Laneta Simmers, he recommended IV antibiotics alone, no surgery.   Acute on chronic diastolic congestive heart failure Acute systolic CHF Elevated BNP on admission.  Transthoracic echocardiogram shows decreased EF compared to previous study February 2023 with LVEF 35-40%, global hypokinesis left ventricle.  Right ventricular systolic function low normal.  Pericardial valve well-seated. Seen by cardiology -- changed to Toprol-XL, started on Jardiance,  losartan. Started on Lasix. 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 Na+ up to 133 today. Chloride WNL   Stable liver hypodensity, and complex renal cyst Consider outpatient MRI    Aortic atherosclerosis and coronary artery calcifications. Can resume statin on discharge.      Subjective:  Feels fine, no complaints  Physical Exam: Vitals:   08/01/22 1530 08/01/22 2118 08/02/22 0501 08/02/22 1256  BP: 138/79 123/69 135/76 122/71  Pulse: (!) 59 65 79 68  Resp: 20 14 19 16   Temp: 97.8 F (36.6 C) (!) 97.5 F (36.4 C) 97.6 F (36.4 C) 97.6 F (36.4 C)  TempSrc: Oral Oral Oral Oral  SpO2: 98% 100% 96% 100%  Weight:      Height:       Physical Exam Vitals reviewed.  Constitutional:      General: He is not in acute distress.    Appearance: He is not ill-appearing or toxic-appearing.  Cardiovascular:     Rate and Rhythm: Normal rate and regular rhythm.     Heart sounds: Murmur heard.  Pulmonary:     Effort: Pulmonary effort is normal. No respiratory distress.     Breath sounds: No wheezing, rhonchi or rales.  Neurological:     Mental Status: He is alert.  Psychiatric:        Mood and Affect: Mood normal.        Behavior: Behavior normal.     Data Reviewed: BNP 1133 Na+ up to 133 Hgb stable  8.5 Plts up to 141  Family Communication: none  Disposition: Status is: Inpatient Remains inpatient appropriate because: endocarditis  Planned Discharge Destination: Home with Home Health    Time spent: 25 minutes  Author: Brendia Sacks, MD 08/02/2022 5:02 PM  For on call review [email or web address].

## 2022-08-02 NOTE — Progress Notes (Signed)
[facility] Infectious Diseases Follow Up Note  Patient Identification: Patient Name: [name] MRN: [id number] Admit Date: 07/28/2022  2:09 AM Age: 82 y.o.Today's Date: 08/02/2022  Reason for Visit: fu on strep bacteremia   Principal Problem:   Sepsis (HCC) Active Problems:   Benign hypertension   S/P AVR   Acute hypoxic respiratory failure (HCC)   Streptococcal bacteremia   HCAP (healthcare-associated pneumonia)   Acute on chronic diastolic CHF (congestive heart failure) (HCC)   Aortic atherosclerosis (HCC)   Medication management   Bacteremia   Acute systolic CHF (congestive heart failure) (HCC)   Aortic valve endocarditis   Cardiomyopathy (HCC)   Chronic HFrEF (heart failure with reduced ejection fraction) (HCC)   Prosthetic cardiac valve vegetation   Anemia   Multifocal pneumonia  Antibiotics:  Vancomycin 6/19-6/20 Cefepime 6/19-6/21, ceftriaxone 6/21-c   Lines/Hardware: rt TKA, hernia mesh  Interval Events: continues to be afebrile,  6/22 repeat blood cx NG in 3 days   Assessment 82 year old male with multiple co-morbidities including CAD, CHF, moderate MR, severe aortic stenosis s/p CABG, clipping of atrial appendage and prosthetic aortic valve replacement in 07/24/2017 admitted with acute onset SOB, fevers. Found to have    # Strep sanguinis Prosthetic AV endocarditis     Repeat blood cx 6/22 NG in 2 days     TEE 6/24 Oscillating density on prosthetic aortic valve consistent with  vegetation. Moderate AV stenosis. TEE findings has been d/w Dr Laneta Simmers and recommended no surgical intervention    CT Hazy diffuse ground-glass opacities in the lungs bilaterally, greater on the right than on the left, possible edema versus multifocal pneeumonia. Small bilateral pleural effusions with compressive atelectasis.   # Acute on chronic CHF Elevated BNP  TEE as above  IV diuretics per primary    Recommendations Continue ceftriaxone 2g iv daily. Plan for 6 weeks from negative blood cultures. Will not add aminoglycoside given highly S Strep and no definite evidence of benefit over risks.  PICC ordered  Monitor CBC and CMP ID Pharm D to place OPAT orders ID will so, pease recall if needed   OPAT  Diagnosis: Prosthetic valve endocarditis   Culture Result: strep sanguinis   Allergies  Allergen Reactions   Sulfamethoxazole-Trimethoprim Rash    OPAT Orders Discharge antibiotics to be given via PICC line Discharge antibiotics:ceftriaxone 2 g IV daily  Per pharmacy protocol  Duration: 6 weeks  End Date: 09/10/22  [facility] Care Per Protocol:  Home health RN for IV administration and teaching; PICC line care and labs.    Labs weekly while on IV antibiotics: X__ CBC with differential __ BMP X_ CMP __ CRP __ ESR __ Vancomycin trough __ CK  X__ Please pull PIC at completion of IV antibiotics __ Please leave PIC in place until doctor has seen patient or been notified  Fax weekly labs to [phone]  Clinic Follow Up Appt: 7/23 at 9: 45 am   Rest of the management as per the primary team. Thank you for the consult. Please page with pertinent questions or concerns.  ______________________________________________________________________ Subjective patient seen and examined at the bedside. Doing well with no complaints, asking about when he can go home   Vitals BP 122/71 (BP Location: Left Arm)   Pulse 68   Temp 97.6 F (36.4 C) (Oral)   Resp 16   Ht 5\' 11"  (1.803 m)   Wt 73.6 kg   SpO2 100%   BMI 22.63 kg/m     Physical Exam Constitutional:  adult male walking with PT    Comments:   Cardiovascular:     Rate and Rhythm: Normal rate and regular rhythm.     Heart sounds:   Pulmonary:     Effort: Pulmonary effort is normal.     Comments:   Abdominal:     Palpations:     Tenderness: non distended   Musculoskeletal:        General: No swelling or  tenderness.   Skin:    Comments: No rashes   Neurological: awake, alert and oriented, following commands   Psychiatric:        Mood and Affect: Mood normal.   Pertinent Microbiology Results for orders placed or performed during the hospital encounter of 07/28/22  Culture, blood (Routine x 2)     Status: Abnormal   Collection Time: 07/28/22  2:12 AM   Specimen: BLOOD  Result Value Ref Range Status   Specimen Description   Final    BLOOD RIGHT ANTECUBITAL Performed at [facility], [address], [place], [place] [address]    Special Requests   Final    BOTTLES DRAWN AEROBIC AND ANAEROBIC Blood Culture adequate volume Performed at [facility], [address], [place], [place] [address]    Culture  Setup Time   Final    GRAM POSITIVE COCCI IN BOTH AEROBIC AND ANAEROBIC BOTTLES CRITICAL VALUE NOTED.  VALUE IS CONSISTENT WITH PREVIOUSLY REPORTED AND CALLED VALUE.    Culture (A)  Final    STREPTOCOCCUS SANGUINIS SUSCEPTIBILITIES PERFORMED ON PREVIOUS CULTURE WITHIN THE LAST 5 DAYS. Performed at [facility], [address] [place], [place] [address]    Report Status 07/30/2022 FINAL  Final  Culture, blood (Routine x 2)     Status: Abnormal   Collection Time: 07/28/22  3:04 AM   Specimen: BLOOD LEFT FOREARM  Result Value Ref Range Status   Specimen Description   Final    BLOOD LEFT FOREARM Performed at [facility], [address], [place], [place] [address]    Special Requests   Final    BOTTLES DRAWN AEROBIC AND ANAEROBIC Blood Culture adequate volume Performed at [facility], [address], [place], [place] [address]    Culture  Setup Time   Final    GRAM POSITIVE COCCI IN CHAINS IN BOTH AEROBIC AND ANAEROBIC BOTTLES CRITICAL RESULT CALLED TO, READ BACK BY AND VERIFIED WITH: L POINDEXTER,[email or web address]  07/29/22 MK Performed at [facility], [address], [place], [place] [address]     Culture STREPTOCOCCUS SANGUINIS (A)  Final   Report Status 07/30/2022 FINAL  Final   Organism ID, Bacteria STREPTOCOCCUS SANGUINIS  Final      Susceptibility   Streptococcus sanguinis - MIC*    PENICILLIN 0.12 SENSITIVE Sensitive     CEFTRIAXONE <=0.12 SENSITIVE Sensitive     ERYTHROMYCIN <=0.12 SENSITIVE Sensitive     LEVOFLOXACIN 1 SENSITIVE Sensitive     VANCOMYCIN 0.5 SENSITIVE Sensitive     * STREPTOCOCCUS SANGUINIS  Blood Culture ID Panel (Reflexed)     Status: Abnormal   Collection Time: 07/28/22  3:04 AM  Result Value Ref Range Status   Enterococcus faecalis NOT DETECTED NOT DETECTED Final   Enterococcus Faecium NOT DETECTED NOT DETECTED Final   Listeria monocytogenes NOT DETECTED NOT DETECTED Final   Staphylococcus species NOT DETECTED NOT DETECTED Final   Staphylococcus aureus (BCID) NOT DETECTED NOT DETECTED Final   Staphylococcus epidermidis NOT DETECTED  NOT DETECTED Final   Staphylococcus lugdunensis NOT DETECTED NOT DETECTED Final   Streptococcus species DETECTED (A) NOT DETECTED Final    Comment: Not Enterococcus species, Streptococcus agalactiae, Streptococcus pyogenes, or Streptococcus pneumoniae. CRITICAL RESULT CALLED TO, READ BACK BY AND VERIFIED WITH: L POINDEXTER,[email or web address]  07/29/22 MK    Streptococcus agalactiae NOT DETECTED NOT DETECTED Final   Streptococcus pneumoniae NOT DETECTED NOT DETECTED Final   Streptococcus pyogenes NOT DETECTED NOT DETECTED Final   A.calcoaceticus-baumannii NOT DETECTED NOT DETECTED Final   Bacteroides fragilis NOT DETECTED NOT DETECTED Final   Enterobacterales NOT DETECTED NOT DETECTED Final   Enterobacter cloacae complex NOT DETECTED NOT DETECTED Final   Escherichia coli NOT DETECTED NOT DETECTED Final   Klebsiella aerogenes NOT DETECTED NOT DETECTED Final   Klebsiella oxytoca NOT DETECTED NOT DETECTED Final   Klebsiella pneumoniae NOT DETECTED NOT DETECTED Final   Proteus species NOT DETECTED NOT DETECTED Final   Salmonella  species NOT DETECTED NOT DETECTED Final   Serratia marcescens NOT DETECTED NOT DETECTED Final   Haemophilus influenzae NOT DETECTED NOT DETECTED Final   Neisseria meningitidis NOT DETECTED NOT DETECTED Final   Pseudomonas aeruginosa NOT DETECTED NOT DETECTED Final   Stenotrophomonas maltophilia NOT DETECTED NOT DETECTED Final   Candida albicans NOT DETECTED NOT DETECTED Final   Candida auris NOT DETECTED NOT DETECTED Final   Candida glabrata NOT DETECTED NOT DETECTED Final   Candida krusei NOT DETECTED NOT DETECTED Final   Candida parapsilosis NOT DETECTED NOT DETECTED Final   Candida tropicalis NOT DETECTED NOT DETECTED Final   Cryptococcus neoformans/gattii NOT DETECTED NOT DETECTED Final    Comment: Performed at [facility], [address] [place], [place] [address]  Resp panel by RT-PCR (RSV, Flu A&B, Covid) Peripheral     Status: None   Collection Time: 07/28/22  3:07 AM   Specimen: Peripheral; Nasal Swab  Result Value Ref Range Status   SARS Coronavirus 2 by RT PCR NEGATIVE NEGATIVE Final    Comment: (NOTE) SARS-CoV-2 target nucleic acids are NOT DETECTED.  The SARS-CoV-2 RNA is generally detectable in upper respiratory specimens during the acute phase of infection. The lowest concentration of SARS-CoV-2 viral copies this assay can detect is 138 copies/mL. A negative result does not preclude SARS-Cov-2 infection and should not be used as the sole basis for treatment or other patient management decisions. A negative result may occur with  improper specimen collection/handling, submission of specimen other than nasopharyngeal swab, presence of viral mutation(s) within the areas targeted by this assay, and inadequate number of viral copies(<138 copies/mL). A negative result must be combined with clinical observations, patient history, and epidemiological information. The expected result is Negative.  Fact Sheet for Patients:   BloggerCourse.com  Fact Sheet for Healthcare Providers:  SeriousBroker.it  This test is no t yet approved or cleared by the [place] FDA and  has been authorized for detection and/or diagnosis of SARS-CoV-2 by FDA under an Emergency Use Authorization (EUA). This EUA will remain  in effect (meaning this test can be used) for the duration of the COVID-19 declaration under Section 564(b)(1) of the Act, 21 U.S.C.section 360bbb-3(b)(1), unless the authorization is terminated  or revoked sooner.       Influenza A by PCR NEGATIVE NEGATIVE Final   Influenza B by PCR NEGATIVE NEGATIVE Final    Comment: (NOTE) The Xpert Xpress SARS-CoV-2/FLU/RSV plus assay is intended as an aid in the diagnosis of influenza from Nasopharyngeal swab specimens and should not be used  as a sole basis for treatment. Nasal washings and aspirates are unacceptable for Xpert Xpress SARS-CoV-2/FLU/RSV testing.  Fact Sheet for Patients: BloggerCourse.com  Fact Sheet for Healthcare Providers: SeriousBroker.it  This test is not yet approved or cleared by the [place] FDA and has been authorized for detection and/or diagnosis of SARS-CoV-2 by FDA under an Emergency Use Authorization (EUA). This EUA will remain in effect (meaning this test can be used) for the duration of the COVID-19 declaration under Section 564(b)(1) of the Act, 21 U.S.C. section 360bbb-3(b)(1), unless the authorization is terminated or revoked.     Resp Syncytial Virus by PCR NEGATIVE NEGATIVE Final    Comment: (NOTE) Fact Sheet for Patients: BloggerCourse.com  Fact Sheet for Healthcare Providers: SeriousBroker.it  This test is not yet approved or cleared by the [place] FDA and has been authorized for detection and/or diagnosis of SARS-CoV-2 by FDA under an Emergency Use  Authorization (EUA). This EUA will remain in effect (meaning this test can be used) for the duration of the COVID-19 declaration under Section 564(b)(1) of the Act, 21 U.S.C. section 360bbb-3(b)(1), unless the authorization is terminated or revoked.  Performed at [facility], [address] [place], [place] [address]   Urine Culture     Status: Abnormal   Collection Time: 07/28/22  5:56 AM   Specimen: Urine, Random  Result Value Ref Range Status   Specimen Description   Final    URINE, RANDOM Performed at [facility], [address] [place], [place] [address]    Special Requests   Final    NONE Reflexed from [id number] Performed at [facility], [address] [place], [place] [address]    Culture (A)  Final    <10,000 COLONIES/mL INSIGNIFICANT GROWTH Performed at [facility], [address], [place], [place] [address]    Report Status 07/29/2022 FINAL  Final  MRSA Next Gen by PCR, Nasal     Status: Abnormal   Collection Time: 07/28/22 12:51 PM   Specimen: Nasal Mucosa; Nasal Swab  Result Value Ref Range Status   MRSA by PCR Next Gen DETECTED (A) NOT DETECTED Final    Comment: (NOTE) The GeneXpert MRSA Assay (FDA approved for NASAL specimens only), is one component of a comprehensive MRSA colonization surveillance program. It is not intended to diagnose MRSA infection nor to guide or monitor treatment for MRSA infections. Test performance is not FDA approved in patients less than 52 years old. Performed at [facility], [address] [place], [place] [address]   Culture, blood (Routine X 2) w Reflex to ID Panel     Status: None (Preliminary result)   Collection Time: 07/30/22  1:44 AM   Specimen: BLOOD LEFT ARM  Result Value Ref Range Status   Specimen Description   Final    BLOOD LEFT ARM BOTTLES DRAWN AEROBIC AND ANAEROBIC Performed at [facility], [address] [place], [place] [address]    Special Requests   Final    Blood Culture adequate volume Performed at [facility], [address] [place], [place] [address]    Culture   Final    NO GROWTH 3 DAYS Performed at [facility], [address], [place], [place] [address]    Report Status PENDING  Incomplete  Culture, blood (Routine X 2) w Reflex to ID Panel     Status: None (Preliminary result)   Collection Time: 07/30/22  1:44 AM   Specimen: BLOOD LEFT HAND  Result Value Ref Range Status   Specimen Description   Final    BLOOD LEFT HAND BOTTLES DRAWN AEROBIC AND ANAEROBIC Performed at [facility], [address] [place], [place] [address]    Special Requests   Final    Blood Culture adequate volume Performed at [facility], [address], [place], [place] [address]    Culture   Final    NO GROWTH 3 DAYS Performed at [facility], [address], [place], [place] [address]    Report Status PENDING  Incomplete    Pertinent Lab.    Latest Ref Rng & Units 08/02/2022    9:31 AM 07/29/2022    3:13 AM 07/28/2022    2:09 AM  CBC  WBC 4.0 - 10.5 K/uL 7.6  12.0  10.5   Hemoglobin 13.0 - 17.0 g/dL 8.5  8.6  9.1   Hematocrit 39.0 - 52.0 % 27.2  26.2  27.6   Platelets 150 - 400 K/uL 141  124  121       Latest Ref Rng & Units 08/02/2022    9:31 AM 07/30/2022    9:15 AM 07/29/2022    3:13 AM  CMP  Glucose 70 - 99 mg/dL 664  403  474   BUN 8 - 23 mg/dL 13  27  33   Creatinine 0.61 - 1.24 mg/dL 2.59  5.63  8.75   Sodium 135 - 145 mmol/L 133  131  129   Potassium 3.5 - 5.1 mmol/L 4.5  3.8  4.0   Chloride 98 - 111 mmol/L 99  94  98   CO2 22 - 32 mmol/L 27  28  24    Calcium 8.9 - 10.3 mg/dL 8.5  8.7  8.3      Pertinent Imaging today Plain films and CT images have been personally visualized and interpreted; radiology reports have been reviewed. Decision making incorporated into the Impression  ECHO TEE  Result  Date: 08/01/2022    TRANSESOPHOGEAL ECHO REPORT   Patient Name:   JALIEN WEAKLAND Date of Exam: 08/01/2022 Medical Rec #:  [id number]      Height:       71.0 in Accession #:    [id number]     Weight:       162.3 lb Date of Birth:  22-Aug-1940      BSA:          1.929 m Patient Age:    81 years       BP:           127/70 mmHg Patient Gender: M              HR:           62 bpm. Exam Location:  Inpatient Procedure: Transesophageal Echo, 3D Echo, Color Doppler and Cardiac Doppler Indications:     Bacteremia  History:         Patient has prior history of Echocardiogram examinations, most                  recent 07/28/2022. CHF, CAD, Prior CABG and LAA clipping; Risk                  Factors:Hypertension and Dyslipidemia. 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  Aortic Valve: 23 mm Edwards inspiris resilia pericardial valve                  is present in the aortic position.  Sonographer:     Irving Burton Senior RDCS Referring Phys:  [id number] Perlie Gold Diagnosing Phys: Olga Millers MD PROCEDURE: After discussion of the risks and benefits of a TEE, an informed consent was obtained from the patient. The transesophogeal probe was passed without difficulty through the esophogus of the patient. Sedation performed by different physician. The patient was monitored while under deep sedation. Anesthestetic sedation was provided intravenously by Anesthesiology: 210mg  of Propofol. The patient developed no complications during the procedure.  IMPRESSIONS  1. Oscillating density on prosthetic aortic valve consistent with vegetation.  2. Left ventricular ejection fraction, by estimation, is 45 to 50%. The left ventricle has mildly decreased function. The left ventricle demonstrates global hypokinesis.  3. Right ventricular systolic function is normal. The right ventricular size is normal.  4. LAA known to be ligated. Left atrial size was severely dilated.  5. Right atrial size was mildly dilated.  6. The  mitral valve is normal in structure. Moderate mitral valve regurgitation.  7. The aortic valve has been repaired/replaced. Aortic valve regurgitation is not visualized. Moderate aortic valve stenosis. There is a 23 mm Edwards inspiris resilia pericardial valve present in the aortic position.  8. There is mild (Grade II) plaque involving the descending aorta. FINDINGS  Left Ventricle: Left ventricular ejection fraction, by estimation, is 45 to 50%. The left ventricle has mildly decreased function. The left ventricle demonstrates global hypokinesis. The left ventricular internal cavity size was normal in size. Right Ventricle: The right ventricular size is normal. Right ventricular systolic function is normal. Left Atrium: LAA known to be ligated. Left atrial size was severely dilated. Right Atrium: Right atrial size was mildly dilated. Pericardium: There is no evidence of pericardial effusion. Mitral Valve: The mitral valve is normal in structure. Moderate mitral valve regurgitation. Tricuspid Valve: The tricuspid valve is normal in structure. Tricuspid valve regurgitation is mild. Aortic Valve: The aortic valve has been repaired/replaced. Aortic valve regurgitation is not visualized. Moderate aortic stenosis is present. Aortic valve mean gradient measures 30.5 mmHg. Aortic valve peak gradient measures 43.7 mmHg. Aortic valve area,  by VTI measures 1.19 cm. There is a 23 mm Edwards inspiris resilia pericardial valve present in the aortic position. Pulmonic Valve: The pulmonic valve was not well visualized. Pulmonic valve regurgitation is trivial. Aorta: The aortic root is normal in size and structure. There is mild (Grade II) plaque involving the descending aorta. IAS/Shunts: No atrial level shunt detected by color flow Doppler. Additional Comments: Oscillating density on prosthetic aortic valve consistent with vegetation. Spectral Doppler performed. LEFT VENTRICLE PLAX 2D LVIDd:         5.50 cm LVIDs:         4.20  cm LV PW:         0.50 cm LVOT diam:     2.20 cm LV SV:         97 LV SV Index:   50 LVOT Area:     3.80 cm  AORTIC VALVE AV Area (Vmax):    1.16 cm AV Area (Vmean):   1.06 cm AV Area (VTI):     1.19 cm AV Vmax:           330.50 cm/s AV Vmean:          267.500 cm/s AV VTI:  0.814 m AV Peak Grad:      43.7 mmHg AV Mean Grad:      30.5 mmHg LVOT Vmax:         101.00 cm/s LVOT Vmean:        74.600 cm/s LVOT VTI:          0.254 m LVOT/AV VTI ratio: 0.31 MR Peak grad:    136.4 mmHg MR Mean grad:    90.0 mmHg    SHUNTS MR Vmax:         584.00 cm/s  Systemic VTI:  0.25 m MR Vmean:        454.0 cm/s   Systemic Diam: 2.20 cm MR PISA:         3.08 cm MR PISA Eff ROA: 19 mm MR PISA Radius:  0.70 cm Olga Millers MD Electronically signed by Olga Millers MD Signature Date/Time: 08/01/2022/2:32:22 PM    Final    EP STUDY  Result Date: 08/01/2022 See surgical note for result.    I have personally spent 52 minutes involved in face-to-face and non-face-to-face activities for this patient on the day of the visit. Professional time spent includes the following activities: Preparing to see the patient (review of tests), Obtaining and/or reviewing separately obtained history (admission/discharge record), Performing a medically appropriate examination and/or evaluation , Ordering medications/tests/procedures, referring and communicating with other health care professionals, Documenting clinical information in the EMR, Independently interpreting results (not separately reported), Communicating results to the patient/family/caregiver, Counseling and educating the patient/family/caregiver and Care coordination (not separately reported).   Plan d/w requesting provider as well as ID pharm D  Note: This document was prepared using dragon voice recognition software and may include unintentional dictation errors.   Electronically signed by:   Odette Fraction, MD Infectious Disease Physician [facility] Pager: [phone]

## 2022-08-02 NOTE — Progress Notes (Signed)
Occupational Therapy Treatment Patient Details Name: [name] MRN: [id number] DOB: 08/15/1940 Today's Date: 08/02/2022   History of present illness 82 y.o. male who presented from home [facility], ILF/ALF) on 07/28/22 with sepsis from Streptococcal bacteremia secondary to  Multifocal pneumonia with associated acute hypoxic respiratory failure as well as acute on chronic CHF.  Pt had recently been admitted last month with low back pain and constipation and found to have low sodium 117.  Recent (May) MRI lumbar spine is notable for artifact versus cord edema at T11-T12.  PMHx: CAD status post CABG in 2019, bioprosthetic aortic valve replacement, cardiomyopathy with recovered EF and gout.   OT comments  Patient was noted to continue to need min guard for ADL tasks with patient having buckling like movements when moving through threshold of doorway in bathroom x2 events. Patient reported " I am bending my knees too much" when asked with min A to maintain balance with RW. Nurse made aware. Patient's discharge plan remains appropriate at this time. OT will continue to follow acutely.     Recommendations for follow up therapy are one component of a multi-disciplinary discharge planning process, led by the attending physician.  Recommendations may be updated based on patient status, additional functional criteria and insurance authorization.    Assistance Recommended at Discharge Intermittent Supervision/Assistance  Patient can return home with the following  A little help with bathing/dressing/bathroom;A little help with walking and/or transfers;Assistance with cooking/housework   Equipment Recommendations  None recommended by OT       Precautions / Restrictions Precautions Precautions: Fall Restrictions Weight Bearing Restrictions: No              ADL either performed or assessed with clinical judgement   ADL Overall ADL's : Needs assistance/impaired         Toilet  Transfer: Cueing for sequencing;Cueing for safety;Min guard;Ambulation;Rolling walker (2 wheels) Toilet Transfer Details (indicate cue type and reason): buckling like movements crossing threshold into bathroom with no LOB. patient reported " i am just bending my knees more:"                  Cognition Arousal/Alertness: Awake/alert Behavior During Therapy: WFL for tasks assessed/performed Overall Cognitive Status: Within Functional Limits for tasks assessed                                          Exercises Other Exercises Other Exercises: patient participated in sit to stands with noted choppy transition of hands to RW when patient does not have min guard from therapist. patient was educated on slowing movements and how to make them more smooth. patient appears to overthink transitions differently with each attempt. patient completed 10 sit to stands with supervision to min guard with RW with patient having one LOB posteirorly into chair with no warning repoting" all my weight was on bath of my legs" needed physical A to slow sit into chair.      Frequency  Min 1X/week        Progress Toward Goals  OT Goals(current goals can now be found in the care plan section)  Progress towards OT goals: Progressing toward goals     Plan Discharge plan remains appropriate       AM-PAC OT "6 Clicks" Daily Activity     Outcome Measure   Help from another person eating meals?: None Help from another  person taking care of personal grooming?: A Little Help from another person toileting, which includes using toliet, bedpan, or urinal?: A Little Help from another person bathing (including washing, rinsing, drying)?: A Little Help from another person to put on and taking off regular upper body clothing?: A Little Help from another person to put on and taking off regular lower body clothing?: A Little 6 Click Score: 19    End of Session Equipment Utilized During  Treatment: Gait belt;Rolling walker (2 wheels)  OT Visit Diagnosis: Muscle weakness (generalized) (M62.81)   Activity Tolerance Patient tolerated treatment well   Patient Left in chair;with call bell/phone within reach   Nurse Communication Mobility status        Time: 1610-9604 OT Time Calculation (min): 20 min  Charges: OT General Charges $OT Visit: 1 Visit OT Treatments $Self Care/Home Management : 8-22 mins  Rosalio Loud, MS Acute Rehabilitation Department Office# [phone]   Selinda Flavin 08/02/2022, 12:07 PM

## 2022-08-02 NOTE — Progress Notes (Signed)
Mobility Specialist - Progress Note   08/02/22 1320  Mobility  Activity Ambulated with assistance in hallway  Level of Assistance Contact guard assist, steadying assist  Assistive Device Front wheel walker  Distance Ambulated (ft) 260 ft  Range of Motion/Exercises Active  Activity Response Tolerated well  Mobility Referral Yes  $Mobility charge 1 Mobility  Mobility Specialist Start Time (ACUTE ONLY) 1300  Mobility Specialist Stop Time (ACUTE ONLY) 1319  Mobility Specialist Time Calculation (min) (ACUTE ONLY) 19 min   Pt received in chair and agreed to mobility. Had no issues throughout session, returned to chair with all needs met. Alarm on.  Marilynne Halsted Mobility Specialist

## 2022-08-03 DIAGNOSIS — J9601 Acute respiratory failure with hypoxia: Secondary | ICD-10-CM | POA: Diagnosis not present

## 2022-08-03 DIAGNOSIS — I358 Other nonrheumatic aortic valve disorders: Secondary | ICD-10-CM | POA: Diagnosis not present

## 2022-08-03 DIAGNOSIS — R7881 Bacteremia: Secondary | ICD-10-CM | POA: Diagnosis not present

## 2022-08-03 DIAGNOSIS — A409 Streptococcal sepsis, unspecified: Secondary | ICD-10-CM | POA: Diagnosis not present

## 2022-08-03 DIAGNOSIS — E7800 Pure hypercholesterolemia, unspecified: Secondary | ICD-10-CM

## 2022-08-03 LAB — BASIC METABOLIC PANEL
Anion gap: 7 (ref 5–15)
BUN: 15 mg/dL (ref 8–23)
CO2: 27 mmol/L (ref 22–32)
Calcium: 8.8 mg/dL — ABNORMAL LOW (ref 8.9–10.3)
Chloride: 102 mmol/L (ref 98–111)
Creatinine, Ser: 0.86 mg/dL (ref 0.61–1.24)
GFR, Estimated: >60 mL/min (ref >60)
Glucose, Bld: 106 mg/dL — ABNORMAL HIGH (ref 70–99)
Potassium: 4.5 mmol/L (ref 3.5–5.1)
Sodium: 136 mmol/L (ref 135–145)

## 2022-08-03 LAB — CULTURE, BLOOD (ROUTINE X 2)

## 2022-08-03 LAB — LIPID PANEL
Cholesterol: 212 mg/dL — ABNORMAL HIGH (ref 0–200)
HDL: 33 mg/dL — ABNORMAL LOW (ref >40)
LDL Cholesterol: 161 mg/dL — ABNORMAL HIGH (ref 0–99)
Total CHOL/HDL Ratio: 6.4 RATIO
Triglycerides: 90 mg/dL (ref <150)
VLDL: 18 mg/dL (ref 0–40)

## 2022-08-03 LAB — LDL CHOLESTEROL, DIRECT: Direct LDL: 155 mg/dL — ABNORMAL HIGH (ref 0–99)

## 2022-08-03 LAB — BRAIN NATRIURETIC PEPTIDE: B Natriuretic Peptide: 992.4 pg/mL — ABNORMAL HIGH (ref 0.0–100.0)

## 2022-08-03 MED ORDER — CEFTRIAXONE IV (FOR PTA / DISCHARGE USE ONLY): 2.0000 g | INTRAVENOUS | 0 refills | Status: AC

## 2022-08-03 MED ORDER — TORSEMIDE 10 MG PO TABS: 10.0000 mg | ORAL_TABLET | Freq: Every day | ORAL | 0 refills | Status: DC

## 2022-08-03 MED ORDER — ATORVASTATIN CALCIUM 40 MG PO TABS: 40.0000 mg | ORAL_TABLET | Freq: Every day | ORAL | Status: DC

## 2022-08-03 MED ORDER — METOPROLOL SUCCINATE ER 25 MG PO TB24: 12.5000 mg | ORAL_TABLET | Freq: Every day | ORAL | 0 refills | Status: DC

## 2022-08-03 MED ORDER — LOSARTAN POTASSIUM 25 MG PO TABS: 25.0000 mg | ORAL_TABLET | Freq: Every evening | ORAL | 0 refills | Status: DC

## 2022-08-03 MED ORDER — ATORVASTATIN CALCIUM 40 MG PO TABS: 40.0000 mg | ORAL_TABLET | Freq: Every day | ORAL | 0 refills | Status: DC

## 2022-08-03 MED ORDER — EMPAGLIFLOZIN 10 MG PO TABS: 10.0000 mg | ORAL_TABLET | Freq: Every day | ORAL | 0 refills | Status: DC

## 2022-08-03 MED ORDER — HEPARIN SOD (PORK) LOCK FLUSH 100 UNIT/ML IV SOLN
250.0000 [IU] | INTRAVENOUS | Status: AC | PRN
  Administered 2022-08-03: 250 [IU]

## 2022-08-03 MED ORDER — TORSEMIDE 10 MG PO TABS
10.0000 mg | ORAL_TABLET | Freq: Every day | ORAL | Status: DC
  Administered 2022-08-03: 10 mg via ORAL
  Filled 2022-08-03: qty 1

## 2022-08-03 NOTE — Progress Notes (Signed)
Physical Therapy Treatment Patient Details Name: [name] MRN: [id number] DOB: 13-Jan-1941 Today's Date: 08/03/2022   History of Present Illness 82 y.o. male who presented from home [facility], ILF/ALF) on 07/28/22 with sepsis from Streptococcal bacteremia secondary to  Multifocal pneumonia with associated acute hypoxic respiratory failure as well as acute on chronic CHF.  Pt had recently been admitted last month with low back pain and constipation and found to have low sodium 117.  Recent (May) MRI lumbar spine is notable for artifact versus cord edema at T11-T12.  PMHx: CAD status post CABG in 2019, bioprosthetic aortic valve replacement, cardiomyopathy with recovered EF and gout.    PT Comments    Pt reports he is discharging home today. He is eager to mobilizing with therapy today. Worked on sit to stands for strengthening, improved technique/performance/safety. Walked ~385 feet around the hallway-tolerated well.     Recommendations for follow up therapy are one component of a multi-disciplinary discharge planning process, led by the attending physician.  Recommendations may be updated based on patient status, additional functional criteria and insurance authorization.  Follow Up Recommendations       Assistance Recommended at Discharge Intermittent Supervision/Assistance  Patient can return home with the following A little help with walking and/or transfers;A little help with bathing/dressing/bathroom;Help with stairs or ramp for entrance;Assistance with cooking/housework   Equipment Recommendations       Recommendations for Other Services       Precautions / Restrictions Precautions Precautions: Fall Restrictions Weight Bearing Restrictions: No     Mobility  Bed Mobility               General bed mobility comments: oob in recliner    Transfers Overall transfer level: Needs assistance Equipment used: Rolling walker (2 wheels) Transfers: Sit to/from  Stand Sit to Stand: Min assist           General transfer comment: Assist to steady. Weak LEs with decreased eccentric control during stand to sit. Cues for safety, technique, hand placement. Worked on sit to stands today x 10 reps.    Ambulation/Gait Ambulation/Gait assistance: Min guard Gait Distance (Feet): 385 Feet Assistive device: Rolling walker (2 wheels) Gait Pattern/deviations: Step-through pattern, Decreased stride length       General Gait Details: Min guard for safety. Intermittent LE weakness/instability observed. No overt LOB. Tolerated distance well.   Stairs             Wheelchair Mobility    Modified Rankin (Stroke Patients Only)       Balance Overall balance assessment: Needs assistance         Standing balance support: Bilateral upper extremity supported, Reliant on assistive device for balance, During functional activity Standing balance-Leahy Scale: Fair                              Cognition Arousal/Alertness: Awake/alert Behavior During Therapy: WFL for tasks assessed/performed Overall Cognitive Status: Within Functional Limits for tasks assessed                                          Exercises      General Comments        Pertinent Vitals/Pain Pain Assessment Pain Assessment: Faces Faces Pain Scale: Hurts a little bit Pain Location: back Pain Descriptors / Indicators: Discomfort Pain Intervention(s): Monitored during  session    Home Living                          Prior Function            PT Goals (current goals can now be found in the care plan section) Progress towards PT goals: Progressing toward goals    Frequency    Min 1X/week      PT Plan Current plan remains appropriate    Co-evaluation              AM-PAC PT "6 Clicks" Mobility   Outcome Measure  Help needed turning from your back to your side while in a flat bed without using bedrails?:  None Help needed moving from lying on your back to sitting on the side of a flat bed without using bedrails?: A Little Help needed moving to and from a bed to a chair (including a wheelchair)?: A Little Help needed standing up from a chair using your arms (e.g., wheelchair or bedside chair)?: A Little Help needed to walk in hospital room?: A Little Help needed climbing 3-5 steps with a railing? : A Lot 6 Click Score: 18    End of Session Equipment Utilized During Treatment: Gait belt Activity Tolerance: Patient tolerated treatment well Patient left: in chair;with call bell/phone within reach;with chair alarm set   PT Visit Diagnosis: Difficulty in walking, not elsewhere classified (R26.2)     Time: 1660-6301 PT Time Calculation (min) (ACUTE ONLY): 28 min  Charges:  $Gait Training: 8-22 mins $Therapeutic Exercise: 8-22 mins                        Faye Ramsay, PT Acute Rehabilitation  Office: [phone]

## 2022-08-03 NOTE — TOC Transition Note (Signed)
Transition of Care [facility] - CM/SW Discharge Note   Patient Details  Name: [name] MRN: [id number] Date of Birth: [birth year]  Transition of Care [facility] CM/SW Contact:  Larrie Kass, LCSW Phone Number: 08/03/2022, 3:17 PM   Clinical Narrative:    Pt's home health was arranged with [facility] for [facility] RN/PT/OT. Pt's reports his aide debbie will provide transportation home. No further TOC needs TOC sign off.    Final next level of care: Other (comment) (ID) Barriers to Discharge: No Barriers Identified   Patient Goals and CMS Choice      Discharge Placement                      Patient and family notified of of transfer: 08/03/22  Discharge Plan and Services Additional resources added to the After Visit Summary for   In-house Referral: Clinical Social Work                        HH Arranged: Charity fundraiser, PT, OT HH Agency: [facility] Date [facility] Agency Contacted: 08/03/22 Time HH Agency Contacted: 1517 Representative spoke with at [facility] Agency: cindie  Social Determinants of Health (SDOH) Interventions SDOH Screenings   Food Insecurity: No Food Insecurity (07/31/2022)  Housing: Low Risk  (07/31/2022)  Transportation Needs: No Transportation Needs (07/31/2022)  Utilities: Not At Risk (07/31/2022)  Alcohol Screen: Low Risk  (05/13/2022)  Depression (PHQ2-9): Low Risk  (07/19/2022)  Financial Resource Strain: Low Risk  (05/13/2022)  Physical Activity: Sufficiently Active (05/13/2022)  Social Connections: Socially Integrated (05/13/2022)  Stress: No Stress Concern Present (05/13/2022)  Tobacco Use: Medium Risk (08/02/2022)     Readmission Risk Interventions    07/29/2022    3:31 PM  Readmission Risk Prevention Plan  Transportation Screening Complete  PCP or Specialist Appt within 5-7 Days Complete  Home Care Screening Complete  Medication Review (RN CM) Complete

## 2022-08-03 NOTE — Progress Notes (Signed)
PHARMACY CONSULT NOTE FOR:  OUTPATIENT  PARENTERAL ANTIBIOTIC THERAPY (OPAT)  Indication: Prosthetic valve endocarditis Regimen: ceftriaxone 2g IV q24h End date: 09/10/2022  IV antibiotic discharge orders are pended. To discharging provider:  please sign these orders via discharge navigator,  Select New Orders & click on the button choice - Manage This Unsigned Work.     Thank you for allowing pharmacy to be a part of this patient's care.  Mickeal Skinner 08/03/2022, 9:19 AM

## 2022-08-03 NOTE — Progress Notes (Signed)
Progress Note  Patient Name: [name] MRN: [id number] DOB: 1940-05-10 Date of Encounter: 08/03/2022  Attending physician: Glade Lloyd, MD Primary care provider: Caesar Bookman, NP Primary Cardiologist: Dr. Truett Mainland  Subjective: [name] is a 82 y.o. Caucasian male who was seen and examined at bedside  Feels much better. He feels that changing positions and moving has gotten better. Denies chest pain and shortness of breath improving. Case discussed and reviewed with his nurse.  Objective: Vital Signs in the last 24 hours: Temp:  [97.6 F (36.4 C)-98.4 F (36.9 C)] 98.4 F (36.9 C) (06/26 0607) Pulse Rate:  [68-75] 75 (06/26 0816) Resp:  [16-18] 17 (06/26 0607) BP: (111-123)/(66-71) 111/67 (06/26 0816) SpO2:  [97 %-100 %] 97 % (06/26 0607)  Intake/Output:  Intake/Output Summary (Last 24 hours) at 08/03/2022 1045 Last data filed at 08/03/2022 1010 Gross per 24 hour  Intake 600 ml  Output 3427 ml  Net -2827 ml    Net IO Since Admission: -12,028.14 mL [08/03/22 1045]  Weights:     07/28/2022    1:00 PM 07/28/2022    2:11 AM 07/19/2022    3:47 PM  Last 3 Weights  Weight (lbs) 162 lb 4.1 oz 165 lb 171 lb 15.3 oz  Weight (kg) 73.6 kg 74.844 kg 78 kg      Telemetry:  Overnight telemetry shows SR, which I personally reviewed.   Physical examination: PHYSICAL EXAM: Vitals:   08/02/22 1256 08/02/22 2233 08/03/22 0607 08/03/22 0816  BP: 122/71 120/66 123/69 111/67  Pulse: 68 69 70 75  Resp: 16 18 17    Temp: 97.6 F (36.4 C) 98.4 F (36.9 C) 98.4 F (36.9 C)   TempSrc: Oral Oral Oral   SpO2: 100% 97% 97%   Weight:      Height:        Physical Exam  Constitutional: No distress.  Age appropriate, hemodynamically stable.   HENT:  Hearing aids present.  Neck: No JVD present.  Cardiovascular: Normal rate, regular rhythm, S1 normal, S2 normal, intact distal pulses and normal pulses. Exam reveals no gallop, no S3 and no S4.  Murmur  heard. Midsystolic murmur is present with a grade of 3/6 at the upper right sternal border. Pulmonary/Chest: Effort normal and breath sounds normal. No stridor. He has no wheezes. He has no rales.  Abdominal: Soft. Bowel sounds are normal. He exhibits no distension. There is no abdominal tenderness.  Musculoskeletal:        General: No edema.     Cervical back: Neck supple.     Comments: PICC line present.  Neurological: He is alert and oriented to person, place, and time. He has intact cranial nerves (2-12).  Skin: Skin is warm and moist.   Lab Results: Chemistry Recent Labs  Lab 07/28/22 0209 07/29/22 0313 07/30/22 0915 08/02/22 0931 08/03/22 0421  NA 129*   < > 131* 133* 136  K 4.1   < > 3.8 4.5 4.5  CL 97*   < > 94* 99 102  CO2 23   < > 28 27 27   GLUCOSE 145*   < > 113* 125* 106*  BUN 29*   < > 27* 13 15  CREATININE 0.84   < > 1.01 0.84 0.86  CALCIUM 8.2*   < > 8.7* 8.5* 8.8*  PROT 5.9*  --   --   --   --   ALBUMIN 2.6*  --   --   --   --   AST  28  --   --   --   --   ALT 32  --   --   --   --   ALKPHOS 64  --   --   --   --   BILITOT 1.0  --   --   --   --   GFRNONAA >60   < > >60 >60 >60  ANIONGAP 9   < > 9 7 7    < > = values in this interval not displayed.    Hematology Recent Labs  Lab 07/28/22 0209 07/29/22 0313 08/02/22 0931  WBC 10.5 12.0* 7.6  RBC 2.95* 2.82* 2.80*  HGB 9.1* 8.6* 8.5*  HCT 27.6* 26.2* 27.2*  MCV 93.6 92.9 97.1  MCH 30.8 30.5 30.4  MCHC 33.0 32.8 31.3  RDW 13.6 13.4 14.4  PLT 121* 124* 141*   High Sensitivity Troponin:  No results for input(s): "TROPONINIHS" in the last 720 hours.   Cardiac EnzymesNo results for input(s): "TROPONINI" in the last 168 hours. No results for input(s): "TROPIPOC" in the last 168 hours.  BNP Recent Labs  Lab 07/28/22 0209 08/01/22 1859 08/03/22 0421  BNP 1,615.2* 1,133.0* 992.4*    DDimer No results for input(s): "DDIMER" in the last 168 hours.  Hemoglobin A1c:  Lab Results  Component Value Date    HGBA1C 5.7 (H) 07/20/2017   MPG 116.89 07/20/2017   TSH  Recent Labs    09/20/21 1048 03/23/22 1031 06/27/22 1102  TSH 1.63 1.39 2.416   Lipid Panel  Lab Results  Component Value Date   CHOL 212 (H) 08/03/2022   HDL 33 (L) 08/03/2022   LDLCALC 161 (H) 08/03/2022   LDLDIRECT 155 (H) 08/03/2022   TRIG 90 08/03/2022   CHOLHDL 6.4 08/03/2022   Drugs of Abuse  No results found for: "LABOPIA", "COCAINSCRNUR", "LABBENZ", "AMPHETMU", "THCU", "LABBARB"    Imaging: [place] EKG SITE RITE  Result Date: 08/02/2022 If Site Rite image not attached, placement could not be confirmed due to current cardiac rhythm.  ECHO TEE  Result Date: 08/01/2022    TRANSESOPHOGEAL ECHO REPORT   Patient Name:   [name] Date of Exam: 08/01/2022 Medical Rec #:  [id number]      Height:       71.0 in Accession #:    [id number]     Weight:       162.3 lb Date of Birth:  October 15, 1940      BSA:          1.929 m Patient Age:    81 years       BP:           127/70 mmHg Patient Gender: M              HR:           62 bpm. Exam Location:  Inpatient Procedure: Transesophageal Echo, 3D Echo, Color Doppler and Cardiac Doppler Indications:     Bacteremia  History:         Patient has prior history of Echocardiogram examinations, most                  recent 07/28/2022. CHF, CAD, Prior CABG and LAA clipping; Risk                  Factors:Hypertension and Dyslipidemia. 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  Aortic Valve: 23 mm Edwards inspiris resilia pericardial valve                  is present in the aortic position.  Sonographer:     Irving Burton Senior RDCS Referring Phys:  [id number] Perlie Gold Diagnosing Phys: Olga Millers MD PROCEDURE: After discussion of the risks and benefits of a TEE, an informed consent was obtained from the patient. The transesophogeal probe was passed without difficulty through the esophogus of the patient. Sedation performed by different physician. The patient was  monitored while under deep sedation. Anesthestetic sedation was provided intravenously by Anesthesiology: 210mg  of Propofol. The patient developed no complications during the procedure.  IMPRESSIONS  1. Oscillating density on prosthetic aortic valve consistent with vegetation.  2. Left ventricular ejection fraction, by estimation, is 45 to 50%. The left ventricle has mildly decreased function. The left ventricle demonstrates global hypokinesis.  3. Right ventricular systolic function is normal. The right ventricular size is normal.  4. LAA known to be ligated. Left atrial size was severely dilated.  5. Right atrial size was mildly dilated.  6. The mitral valve is normal in structure. Moderate mitral valve regurgitation.  7. The aortic valve has been repaired/replaced. Aortic valve regurgitation is not visualized. Moderate aortic valve stenosis. There is a 23 mm Edwards inspiris resilia pericardial valve present in the aortic position.  8. There is mild (Grade II) plaque involving the descending aorta. FINDINGS  Left Ventricle: Left ventricular ejection fraction, by estimation, is 45 to 50%. The left ventricle has mildly decreased function. The left ventricle demonstrates global hypokinesis. The left ventricular internal cavity size was normal in size. Right Ventricle: The right ventricular size is normal. Right ventricular systolic function is normal. Left Atrium: LAA known to be ligated. Left atrial size was severely dilated. Right Atrium: Right atrial size was mildly dilated. Pericardium: There is no evidence of pericardial effusion. Mitral Valve: The mitral valve is normal in structure. Moderate mitral valve regurgitation. Tricuspid Valve: The tricuspid valve is normal in structure. Tricuspid valve regurgitation is mild. Aortic Valve: The aortic valve has been repaired/replaced. Aortic valve regurgitation is not visualized. Moderate aortic stenosis is present. Aortic valve mean gradient measures 30.5 mmHg. Aortic  valve peak gradient measures 43.7 mmHg. Aortic valve area,  by VTI measures 1.19 cm. There is a 23 mm Edwards inspiris resilia pericardial valve present in the aortic position. Pulmonic Valve: The pulmonic valve was not well visualized. Pulmonic valve regurgitation is trivial. Aorta: The aortic root is normal in size and structure. There is mild (Grade II) plaque involving the descending aorta. IAS/Shunts: No atrial level shunt detected by color flow Doppler. Additional Comments: Oscillating density on prosthetic aortic valve consistent with vegetation. Spectral Doppler performed. LEFT VENTRICLE PLAX 2D LVIDd:         5.50 cm LVIDs:         4.20 cm LV PW:         0.50 cm LVOT diam:     2.20 cm LV SV:         97 LV SV Index:   50 LVOT Area:     3.80 cm  AORTIC VALVE AV Area (Vmax):    1.16 cm AV Area (Vmean):   1.06 cm AV Area (VTI):     1.19 cm AV Vmax:           330.50 cm/s AV Vmean:          267.500 cm/s AV VTI:  0.814 m AV Peak Grad:      43.7 mmHg AV Mean Grad:      30.5 mmHg LVOT Vmax:         101.00 cm/s LVOT Vmean:        74.600 cm/s LVOT VTI:          0.254 m LVOT/AV VTI ratio: 0.31 MR Peak grad:    136.4 mmHg MR Mean grad:    90.0 mmHg    SHUNTS MR Vmax:         584.00 cm/s  Systemic VTI:  0.25 m MR Vmean:        454.0 cm/s   Systemic Diam: 2.20 cm MR PISA:         3.08 cm MR PISA Eff ROA: 19 mm MR PISA Radius:  0.70 cm Olga Millers MD Electronically signed by Olga Millers MD Signature Date/Time: 08/01/2022/2:32:22 PM    Final    EP STUDY  Result Date: 08/01/2022 See surgical note for result.   CARDIAC DATABASE: Cardiac surgery 07/24/2017: CABG (LIMA-LAD, SVG- dLCx, SVG-RCA) Aortic valve replacement 23 mm Edwards Inspiris Resilia pericardial valve LAA clipping   EKG: 07/28/2022: Sinus tachycardia, 108 bpm, PVC, without underlying injury pattern.   Echocardiogram: 03/16/2021: Left ventricle cavity is normal in size and wall thickness. Abnormal septal wall motion due to  post-operative valve. Normal LV systolic function with visual EF 50-55%. Doppler evidence of grade II (pseudonormal) diastolic dysfunction, elevated LAP.  Left atrial cavity is moderately dilated. Well seated bioprosthetic aortic valve. Mildly elevated mean PG 13 mmHg. No aortic valve regurgitation. Mild mitral valve leaflet thickening. Moderate (Grade II) mitral regurgitation. Mild tricuspid regurgitation.  Mild pulmonic regurgitation. No evidence of pulmonary hypertension. No significant change compared to previous study on 02/21/2020.   07/28/2022:  1. Left ventricular ejection fraction, by estimation, is 35 to 40%. The left ventricle has moderately decreased function. The left ventricle  demonstrates global hypokinesis. Left ventricular diastolic function could not be evaluated.   2. Right ventricular systolic function is low normal. The right ventricular size is normal.   3. Left atrial size was severely dilated.   4. Right atrial size was mild to moderately dilated.   5. The mitral valve is degenerative. Mild to moderate mitral valve  regurgitation.   6. S/p 23 mm Edwards Inspiris Resilia pericardial valve, well seated, no valvular or perivalvular regurgitation, suggestive of mild prosthetic aortic valve stenosis (peak velocity 3.36m/s, MG , DI 0.32, AT >132msec) visually leaflet thickness is  normal and mobility is preserved.   7. There is mild dilatation of the ascending aorta, measuring 41 mm.   8. The inferior vena cava is normal in size with greater than 50% respiratory variability, suggesting right atrial pressure of 3 mmHg.   9. Rhythm strip during this exam demonstrates normal sinus rhythm and sinus bradycardia.    TEE 06/24/024:  1. Oscillating density on prosthetic aortic valve consistent with vegetation.   2. Left ventricular ejection fraction, by estimation, is 45 to 50%. The  left ventricle has mildly decreased function. The left ventricle  demonstrates global  hypokinesis.   3. Right ventricular systolic function is normal. The right ventricular size is normal.   4. LAA known to be ligated. Left atrial size was severely dilated.   5. Right atrial size was mildly dilated.   6. The mitral valve is normal in structure. Moderate mitral valve regurgitation.   7. The aortic valve has been repaired/replaced. Aortic valve regurgitation is not visualized.  Moderate aortic valve stenosis. There is  a 23 mm Edwards inspiris resilia pericardial valve present in the aortic position.   8. There is mild (Grade II) plaque involving the descending aorta.   Scheduled Meds:  aspirin EC  81 mg Oral Daily   [START ON 08/04/2022] atorvastatin  40 mg Oral Daily   Chlorhexidine Gluconate Cloth  6 each Topical Daily   docusate sodium  100 mg Oral BID   empagliflozin  10 mg Oral Daily   enoxaparin (LOVENOX) injection  40 mg Subcutaneous Q24H   lidocaine  1 patch Transdermal Q24H   loratadine  10 mg Oral Daily   losartan  25 mg Oral QPM   metoprolol succinate  12.5 mg Oral Daily   mirabegron ER  25 mg Oral Daily   mouth rinse  15 mL Mouth Rinse 4 times per day   sodium chloride flush  10-40 mL Intracatheter Q12H   tamsulosin  0.4 mg Oral QPC supper   torsemide  10 mg Oral Daily    Continuous Infusions:  cefTRIAXone (ROCEPHIN)  IV 2 g (08/02/22 1202)    PRN Meds: acetaminophen **OR** acetaminophen, albuterol, ondansetron **OR** ondansetron (ZOFRAN) IV, mouth rinse, polyethylene glycol, sodium chloride flush, traZODone   IMPRESSION & RECOMMENDATIONS: [name] is a 82 y.o. 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   Impression: Cardiomyopathy, newly discovered. 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 Streptococcal bacteremia Multifocal pneumonia/HCAP. Normocytic normochromic anemia. Established CAD status post CABG without angina pectoris. Aortic atherosclerosis. Hypertension Hyperlipidemia, pure.  Plan: Cardiomyopathy newly discovered Compensated heart failure with reduced EF, stage C, NYHA class II Net IO Since Admission: -12,028.14 mL [08/03/22 1045] BNP levels trending down. Recommend transitioning from IV Lasix to torsemide 10 mg p.o. daily at the time of discharge. Continue Jardiance, metoprolol, losartan. Outpatient transition losartan to ARNI. Outpatient add spironolactone. Outpatient follow-up scheduled for August 09, 2022  Prosthetic aortic valve endocarditis Streptococcal bacteremia Prosthetic valve aortic stenosis: Mobile vegetation noted on transesophageal echocardiogram 08/01/2022 involving the aortic valve Telemetry reviewed-likely discontinued Antibiotic course as per infectious disease I suspect that the prostatic valve aortic stenosis should improve as visitation resolves.   Will need repeat echo to reevaluate severity of prosthetic valve aortic stenosis.  Establish coronary disease status post CABG without angina pectoris: Aortic atherosclerosis: Continue current medical therapy. Fasting lipids checked.  LDL is not well-controlled. In the setting of prior bypass surgery recommended goal LDL <55 mg/dL. Will increase Lipitor to 40 mg p.o. nightly.  This needs to be reevaluated in 6 weeks and if needed up titration of medical therapy.    Plan of care discussed with attending physician as well as the patient.  Patient's questions and concerns were addressed to his satisfaction. He voices understanding of the instructions provided during this encounter.   This note was created using a voice recognition software as a result there may be grammatical errors inadvertently enclosed that do not reflect the nature of this  encounter. Every attempt is made to correct such errors.  Delilah Shan [facility]  Pager:  [phone] Office: [phone] 08/03/2022, 10:45 AM

## 2022-08-03 NOTE — Plan of Care (Signed)

## 2022-08-03 NOTE — Discharge Summary (Signed)
Physician Discharge Summary  Bejamin Hackbart KVQ:[id number] DOB: September 29, 1940 DOA: 07/28/2022  PCP: Caesar Bookman, NP  Admit date: 07/28/2022 Discharge date: 08/03/2022  Admitted From: Home Disposition: Home  Recommendations for Outpatient Follow-up:  Follow up with PCP in 1 week with repeat CBC/BMP Outpatient follow-up with cardiology and ID Follow up in ED if symptoms worsen or new appear   Home Health: Home health PT/OT/RN Equipment/Devices: PICC line placed on 08/02/2022  Discharge Condition: Stable CODE STATUS: Full Diet recommendation: Heart healthy  Brief/Interim Summary: 82 year old man from independent living, PMH including diastolic CHF, aortic valve replacement, recent admission for hyponatremia, presented with fever and respiratory distress requiring 8 L high flow nasal cannula, noted to be tachycardic and febrile. Admitted for sepsis secondary to multifocal pneumonia, acute on chronic diastolic CHF. Subsequently found to be bacteremic. TEE revealed aortic valve vegetation. Per cardiothoracic surgery: no need for surgery.  ID recommended IV antibiotics for 6 weeks.  PICC line placed on 08/02/2022.  He really wants to go home today.  He will be discharged home on IV Rocephin till 09/10/2022 with outpatient follow-up with PCP/ID/cardiology.  Discharge Diagnoses:   Sepsis secondary to High grade Strept sanguinis bacteremia  Aortic valve endocarditis Multifocal pneumonia with associated acute hypoxic respiratory failure with SpO2 79% per chart  Sepsis pathophysiology resolved.  Hypoxia resolved.  Currently on room air. Currently on IV ceftriaxone per infectious disease. TEE showed vegetation on prosthetic aortic valve Fu repeat blood cultures no growth 3 days.  PICC line placed on 08/02/2022 as per ID recommendations.   Discharge home today on IV Rocephin till 09/10/2022 as per ID recommendations with outpatient follow-up with ID.   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 Laneta Simmers TEE: Oscillating density on prosthetic aortic valve consistent with  vegetation.  Discussed with Dr. Laneta Simmers by prior hospitalist, he recommended IV antibiotics alone, no surgery.  Outpatient follow-up with cardiothoracic surgery if needed  Acute systolic CHF Elevated BNP on admission.  Transthoracic echocardiogram shows decreased EF compared to previous study February 2023 with LVEF 35-40%, global hypokinesis left ventricle.  Right ventricular systolic function low normal.  Pericardial valve well-seated. Seen by cardiology -- changed to Toprol-XL, started on Jardiance, losartan. Started on IV Lasix which has been switched to oral torsemide. Cardiology has cleared the patient for discharge on oral meds as above.  Follow-up with cardiology as an outpatient   Normocytic anemia, acute on chronic, baseline Hgb 10-12 (anemia of critical illness) Thrombocytopenia, secondary to sepsis Hgb currently stable.  Outpatient follow-up with PCP.   Platelets improving to 141 on 08/02/2022.   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 Na+ up to 136 today.  Outpatient follow-up.  Stable liver hypodensity, and complex renal cyst Consider outpatient MRI    Aortic atherosclerosis and coronary artery calcifications. Hyperlipidemia Increase Lipitor to 40 mg daily  Discharge Instructions  Discharge Instructions     [facility] pharmacist to adjust dose for Vancomycin, Aminoglycosides and other anti-infective therapies as requested by physician.   Complete by: As directed    [facility] to provide Cath Flo 2mg    Complete by: As directed    Administer for PICC line occlusion and as ordered by physician for other access device issues.   Ambulatory referral to  Infectious Disease   Complete  by: As directed    Anaphylaxis Kit: Provided to treat any anaphylactic reaction to the medication being provided to the patient if First Dose or when requested by physician   Complete by: As directed    Epinephrine 1mg /ml vial / amp: Administer 0.3mg  (0.24ml) subcutaneously once for moderate to severe anaphylaxis, nurse to call physician and pharmacy when reaction occurs and call 911 if needed for immediate care   Diphenhydramine 50mg /ml IV vial: Administer 25-50mg  IV/IM PRN for first dose reaction, rash, itching, mild reaction, nurse to call physician and pharmacy when reaction occurs   Sodium Chloride 0.9% NS IV: Administer if needed for hypovolemic blood pressure drop or as ordered by physician after call to physician with anaphylactic reaction   Change dressing on IV access line weekly and PRN   Complete by: As directed    Diet - low sodium heart healthy   Complete by: As directed    Flush IV access with Sodium Chloride 0.9% and Heparin 10 units/ml or 100 units/ml   Complete by: As directed    Home infusion instructions - [facility]   Complete by: As directed    Instructions: Flush IV access with Sodium Chloride 0.9% and Heparin 10units/ml or 100units/ml   Change dressing on IV access line: Weekly and PRN   Instructions Cath Flo 2mg : Administer for PICC Line occlusion and as ordered by physician for other access device   [facility] pharmacist to adjust dose for: Vancomycin, Aminoglycosides and other anti-infective therapies as requested by physician   Increase activity slowly   Complete by: As directed    Method of administration may be changed at the discretion of home infusion pharmacist based upon assessment of the patient and/or caregiver's ability to self-administer the medication ordered   Complete by: As directed    No wound care   Complete by: As directed       Allergies as of 08/03/2022       Reactions    Sulfamethoxazole-trimethoprim Rash        Medication List     STOP taking these medications    carvedilol 3.125 MG tablet Commonly known as: Coreg       TAKE these medications    acetaminophen 500 MG tablet Commonly known as: TYLENOL Take 2 tablets (1,000 mg total) by mouth every 6 (six) hours as needed for mild pain or fever.   ascorbic acid 500 MG tablet Commonly known as: VITAMIN C Take 1 tablet (500 mg total) by mouth daily.   aspirin EC 81 MG tablet Take 81 mg by mouth daily. Swallow whole.   atorvastatin 40 MG tablet Commonly known as: LIPITOR Take 1 tablet (40 mg total) by mouth daily. Start taking on: August 04, 2022   cefTRIAXone  IVPB Commonly known as: ROCEPHIN Inject 2 g into the vein daily. Indication:  Endocarditis First Dose: No Last Day of Therapy:  09/10/22 Labs - Once weekly:  CBC/D and BMP, Labs - Once weekly: ESR and CRP Method of administration: IV Push Method of administration may be changed at the discretion of home infusion pharmacist based upon assessment of the patient and/or caregiver's ability to self-administer the medication ordered.   diclofenac Sodium 1 % Gel Commonly known as: VOLTAREN Apply 2 g topically 4 (four) times daily. To low back   empagliflozin 10 MG Tabs tablet Commonly known as: JARDIANCE Take 1 tablet (10 mg total) by mouth daily. Start taking on: August 04, 2022   Fish Oil  1000 MG Caps Take 2 capsules by mouth daily.   lactulose 20 g packet Commonly known as: CEPHULAC Take 1 packet (20 g total) by mouth 3 (three) times daily as needed (constipation).   lidocaine 5 % Commonly known as: Lidoderm Place 1 patch onto the skin daily. Remove & Discard patch within 12 hours or as directed by MD   losartan 25 MG tablet Commonly known as: COZAAR Take 1 tablet (25 mg total) by mouth every evening.   metoprolol succinate 25 MG 24 hr tablet Commonly known as: TOPROL-XL Take 0.5 tablets (12.5 mg total) by mouth  daily. Start taking on: August 04, 2022   MULTIVITAMIN ADULT PO Take 1 tablet by mouth daily.   Myrbetriq 25 MG Tb24 tablet Generic drug: mirabegron ER Take 25 mg by mouth daily.   silodosin 8 MG Caps capsule Commonly known as: RAPAFLO Take 8 mg by mouth daily.   torsemide 10 MG tablet Commonly known as: DEMADEX Take 1 tablet (10 mg total) by mouth daily.   ZyrTEC Allergy 10 MG Caps Generic drug: Cetirizine HCl Take 10 mg by mouth daily as needed (as needed for allergies).               Discharge Care Instructions  (From admission, onward)           Start     Ordered   08/03/22 0000  Change dressing on IV access line weekly and PRN  (Home infusion instructions - [facility] )        08/03/22 0918            Follow-up Information     Elder Negus, MD. Go on 08/09/2022.   Specialties: Cardiology, Radiology Why: AT 3pm Posthospitalization, HFrEF, prosthetic valve endocarditis Contact information: [address] [place] [address] [phone]         Ngetich, Donalee Citrin, NP. Schedule an appointment as soon as possible for a visit in 1 week(s).   Specialty: Family Medicine Contact information: [address] [place] [address] [phone]                Allergies  Allergen Reactions   Sulfamethoxazole-Trimethoprim Rash    Consultations: Cardiology/ID. Prior hospitalist discussed with Dr. Laneta Simmers  Procedures/Studies: [place] EKG SITE RITE  Result Date: 08/02/2022 If Site Rite image not attached, placement could not be confirmed due to current cardiac rhythm.  ECHO TEE  Result Date: 08/01/2022    TRANSESOPHOGEAL ECHO REPORT   Patient Name:   RAMEL TOBON Date of Exam: 08/01/2022 Medical Rec #:  [id number]      Height:       71.0 in Accession #:    [id number]     Weight:       162.3 lb Date of Birth:  February 10, 1940      BSA:          1.929 m Patient Age:    81 years       BP:           127/70 mmHg Patient  Gender: M              HR:           62 bpm. Exam Location:  Inpatient Procedure: Transesophageal Echo, 3D Echo, Color Doppler and Cardiac Doppler Indications:     Bacteremia  History:         Patient has prior history of Echocardiogram examinations, most  recent 07/28/2022. CHF, CAD, Prior CABG and LAA clipping; Risk                  Factors:Hypertension and Dyslipidemia. 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                  Aortic Valve: 23 mm Edwards inspiris resilia pericardial valve                  is present in the aortic position.  Sonographer:     Irving Burton Senior RDCS Referring Phys:  [id number] Perlie Gold Diagnosing Phys: Olga Millers MD PROCEDURE: After discussion of the risks and benefits of a TEE, an informed consent was obtained from the patient. The transesophogeal probe was passed without difficulty through the esophogus of the patient. Sedation performed by different physician. The patient was monitored while under deep sedation. Anesthestetic sedation was provided intravenously by Anesthesiology: 210mg  of Propofol. The patient developed no complications during the procedure.  IMPRESSIONS  1. Oscillating density on prosthetic aortic valve consistent with vegetation.  2. Left ventricular ejection fraction, by estimation, is 45 to 50%. The left ventricle has mildly decreased function. The left ventricle demonstrates global hypokinesis.  3. Right ventricular systolic function is normal. The right ventricular size is normal.  4. LAA known to be ligated. Left atrial size was severely dilated.  5. Right atrial size was mildly dilated.  6. The mitral valve is normal in structure. Moderate mitral valve regurgitation.  7. The aortic valve has been repaired/replaced. Aortic valve regurgitation is not visualized. Moderate aortic valve stenosis. There is a 23 mm Edwards inspiris resilia pericardial valve present in the aortic position.  8. There is mild (Grade II) plaque  involving the descending aorta. FINDINGS  Left Ventricle: Left ventricular ejection fraction, by estimation, is 45 to 50%. The left ventricle has mildly decreased function. The left ventricle demonstrates global hypokinesis. The left ventricular internal cavity size was normal in size. Right Ventricle: The right ventricular size is normal. Right ventricular systolic function is normal. Left Atrium: LAA known to be ligated. Left atrial size was severely dilated. Right Atrium: Right atrial size was mildly dilated. Pericardium: There is no evidence of pericardial effusion. Mitral Valve: The mitral valve is normal in structure. Moderate mitral valve regurgitation. Tricuspid Valve: The tricuspid valve is normal in structure. Tricuspid valve regurgitation is mild. Aortic Valve: The aortic valve has been repaired/replaced. Aortic valve regurgitation is not visualized. Moderate aortic stenosis is present. Aortic valve mean gradient measures 30.5 mmHg. Aortic valve peak gradient measures 43.7 mmHg. Aortic valve area,  by VTI measures 1.19 cm. There is a 23 mm Edwards inspiris resilia pericardial valve present in the aortic position. Pulmonic Valve: The pulmonic valve was not well visualized. Pulmonic valve regurgitation is trivial. Aorta: The aortic root is normal in size and structure. There is mild (Grade II) plaque involving the descending aorta. IAS/Shunts: No atrial level shunt detected by color flow Doppler. Additional Comments: Oscillating density on prosthetic aortic valve consistent with vegetation. Spectral Doppler performed. LEFT VENTRICLE PLAX 2D LVIDd:         5.50 cm LVIDs:         4.20 cm LV PW:         0.50 cm LVOT diam:     2.20 cm LV SV:         97 LV SV Index:  50 LVOT Area:     3.80 cm  AORTIC VALVE AV Area (Vmax):    1.16 cm AV Area (Vmean):   1.06 cm AV Area (VTI):     1.19 cm AV Vmax:           330.50 cm/s AV Vmean:          267.500 cm/s AV VTI:            0.814 m AV Peak Grad:      43.7 mmHg AV  Mean Grad:      30.5 mmHg LVOT Vmax:         101.00 cm/s LVOT Vmean:        74.600 cm/s LVOT VTI:          0.254 m LVOT/AV VTI ratio: 0.31 MR Peak grad:    136.4 mmHg MR Mean grad:    90.0 mmHg    SHUNTS MR Vmax:         584.00 cm/s  Systemic VTI:  0.25 m MR Vmean:        454.0 cm/s   Systemic Diam: 2.20 cm MR PISA:         3.08 cm MR PISA Eff ROA: 19 mm MR PISA Radius:  0.70 cm Olga Millers MD Electronically signed by Olga Millers MD Signature Date/Time: 08/01/2022/2:32:22 PM    Final    EP STUDY  Result Date: 08/01/2022 See surgical note for result.  ECHOCARDIOGRAM COMPLETE  Result Date: 07/30/2022    ECHOCARDIOGRAM REPORT   Patient Name:   VERA WISHART Date of Exam: 07/28/2022 Medical Rec #:  [id number]      Height:       71.0 in Accession #:    [id number]     Weight:       165.0 lb Date of Birth:  06-27-1940      BSA:          1.943 m Patient Age:    81 years       BP:           123/72 mmHg Patient Gender: M              HR:           68 bpm. Exam Location:  Inpatient Procedure: 2D Echo, Cardiac Doppler and Color Doppler Indications:     CHF  History:         Patient has no prior history of Echocardiogram examinations.                  CHF, CAD, Mitral Valve Disease and Aortic Valve Disease; Risk                  Factors:Hypertension.  Sonographer:     Darlys Gales Referring Phys:  [id number] MIR M [facility] Diagnosing Phys: Tessa Lerner DO IMPRESSIONS  1. Left ventricular ejection fraction, by estimation, is 35 to 40%. The left ventricle has moderately decreased function. The left ventricle demonstrates global hypokinesis. Left ventricular diastolic function could not be evaluated.  2. Right ventricular systolic function is low normal. The right ventricular size is normal.  3. Left atrial size was severely dilated.  4. Right atrial size was mild to moderately dilated.  5. The mitral valve is degenerative. Mild to moderate mitral valve regurgitation.  6. 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  7. There is mild dilatation of the ascending aorta, measuring 41 mm.  8. The inferior vena cava is normal in size with greater than 50% respiratory variability, suggesting right atrial pressure of 3 mmHg.  9. Rhythm strip during this exam demonstrates normal sinus rhythm and sinus bradycardia. Comparison(s): Prior echo 03/16/2021: LVEF 50-55%, grade 2 diastolic dysfunction, elevated LAP, moderately dilated left atrium, moderate MR, mild TR/PR, well-seated bioprosthetic aortic valve (peak gradient 13 mmHg) no valvular regurgitation. FINDINGS  Left Ventricle: Left ventricular ejection fraction, by estimation, is 35 to 40%. The left ventricle has moderately decreased function. The left ventricle demonstrates global hypokinesis. The left ventricular internal cavity size was normal in size. There is no left ventricular hypertrophy. Left ventricular diastolic function could not be evaluated due to nondiagnostic images. Left ventricular diastolic function could not be evaluated. Right Ventricle: The right ventricular size is normal. Right vetricular wall thickness was not well visualized. Right ventricular systolic function is low normal. Left Atrium: Left atrial size was severely dilated. Right Atrium: Right atrial size was mild to moderately dilated. Pericardium: There is no evidence of pericardial effusion. Mitral Valve: The mitral valve is degenerative in appearance. Mild to moderate mitral valve regurgitation, with centrally-directed jet. Tricuspid Valve: The tricuspid valve is grossly normal. Tricuspid valve regurgitation is mild . No evidence of tricuspid stenosis. 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 Aortic valve mean gradient measures 26.0 mmHg. Aortic valve peak gradient measures 36.3 mmHg. Aortic valve area, by VTI measures 0.92 cm. There is a 23 mm Edwards bioprosthetic valve present in the aortic position. Pulmonic Valve: The pulmonic valve was not well visualized. Pulmonic valve regurgitation is mild. No evidence of pulmonic stenosis. Aorta: The aortic root was not well visualized. There is mild dilatation of the ascending aorta, measuring 41 mm. Venous: The inferior vena cava is normal in size with greater than 50% respiratory variability, suggesting right atrial pressure of 3 mmHg. IAS/Shunts: The atrial septum is grossly normal. EKG: Rhythm strip during this exam demonstrates normal sinus rhythm and sinus bradycardia.  LEFT VENTRICLE PLAX 2D LVIDd:         5.80 cm LVIDs:         4.80 cm LV PW:         1.00 cm LV IVS:        1.00 cm LVOT diam:     1.90 cm LV SV:         77 LV SV Index:   40 LVOT Area:     2.84 cm  RIGHT VENTRICLE            IVC RV S prime:     9.46 cm/s  IVC diam: 0.90 cm TAPSE (M-mode): 1.7 cm LEFT ATRIUM             Index        RIGHT ATRIUM           Index LA Vol (A2C):   85.6 ml 44.05 ml/m  RA Area:     16.90 cm LA Vol (A4C):   98.3 ml 50.59 ml/m  RA Volume:   39.20 ml  20.17 ml/m LA Biplane Vol: 93.9 ml 48.33 ml/m  AORTIC VALVE AV Area (  Vmax):    0.89 cm AV Area (Vmean):   0.89 cm AV Area (VTI):     0.92 cm AV Vmax:           301.33 cm/s AV Vmean:          249.333 cm/s AV VTI:            0.843 m AV Peak Grad:      36.3 mmHg AV Mean Grad:      26.0 mmHg LVOT Vmax:         95.00 cm/s LVOT Vmean:        78.700 cm/s LVOT VTI:          0.273 m LVOT/AV VTI ratio: 0.32  AORTA Ao Asc diam: 4.10 cm TRICUSPID VALVE TR Peak grad:   26.6 mmHg TR Vmax:        258.00 cm/s  SHUNTS Systemic VTI:  0.27 m  Systemic Diam: 1.90 cm Sunit Tolia DO Electronically signed by Tessa Lerner DO Signature Date/Time: 07/30/2022/9:22:32 AM    Final    CT CHEST ABDOMEN PELVIS W CONTRAST  Result Date: 07/28/2022 CLINICAL DATA:  Sepsis. Shortness of breath, elevated white count, CHF, hepatitis. EXAM: CT CHEST, ABDOMEN, AND PELVIS WITH CONTRAST TECHNIQUE: Multidetector CT imaging of the chest, abdomen and pelvis was performed following the standard protocol during bolus administration of intravenous contrast. RADIATION DOSE REDUCTION: This exam was performed according to the departmental dose-optimization program which includes automated exposure control, adjustment of the mA and/or kV according to patient size and/or use of iterative reconstruction technique. CONTRAST:  OMNIPAQUE IOHEXOL 300 MG/ML  SOLN COMPARISON:  06/24/2022. FINDINGS: CT CHEST FINDINGS Cardiovascular: The heart is normal in size and there is no pericardial effusion. Multi-vessel coronary artery calcifications are noted. There is atherosclerotic calcification of the aorta without evidence of aneurysm. The pulmonary trunk is normal in caliber. Mediastinum/Nodes: Enlarged lymph nodes are present in the mediastinum measuring up to 1.5 cm in the precarinal space. No hilar or axillary lymphadenopathy. A subcentimeter hypodensity is noted in the left lobe of the thyroid gland. No additional imaging is recommended. The trachea and esophagus are within normal limits. There is a small hiatal hernia. Lungs/Pleura: There are small bilateral pleural effusions bilaterally with compressive atelectasis. Diffuse hazy airspace opacities are present in the lungs bilaterally, greater on the right than on the left. No pneumothorax. Musculoskeletal: Sternotomy wires are noted. Degenerative changes are present in the thoracic spine. No acute osseous abnormality is seen. CT ABDOMEN PELVIS FINDINGS Hepatobiliary: Cyst is noted in the left lobe of the liver. A stable hypodensity is  noted in the posterior right lobe of the liver measuring 4.5 cm. The gallbladder is without stones. No biliary ductal dilatation is seen. Pancreas: Unremarkable. No pancreatic ductal dilatation or surrounding inflammatory changes. Spleen: Normal in size without focal abnormality. Adrenals/Urinary Tract: The adrenal glands are within normal limits. The kidneys enhance symmetrically. There is a cyst in the lower pole of the left kidney. A slightly complex hypodensity is noted in the lower pole of the right kidney measuring 2 cm. No renal calculus or hydronephrosis. The bladder is unremarkable. Stomach/Bowel: There is a small hiatal hernia. Stomach is within normal limits. Appendix appears normal. No evidence of bowel wall thickening, distention, or inflammatory changes. No free air or pneumatosis. Scattered diverticula are present along the colon without evidence of diverticulitis. Vascular/Lymphatic: Aortic atherosclerosis. No enlarged abdominal or pelvic lymph nodes. Reproductive: Prostate is unremarkable. Other: Small amount free fluid in the  pelvis. Musculoskeletal: Degenerative changes are present in the lumbar spine. No acute fracture is seen. IMPRESSION: 1. Hazy diffuse ground-glass opacities in the lungs bilaterally, greater on the right than on the left, possible edema versus multifocal pneumonia. 2. Small bilateral pleural effusions with compressive atelectasis. 3. Stable indeterminate hypodensity in the posterior right lobe of the liver measuring 4.5 cm. MRI with contrast is recommended for further evaluation on nonemergent follow-up given history of hepatitis. 4. Small ascites in the pelvis. 5. Complex cyst in the mid right kidney measuring 2 cm. Lesion may also be better evaluated on MRI. 6. Small hiatal hernia. 7. Aortic atherosclerosis and coronary artery calcifications. Electronically Signed   By: Thornell Sartorius M.D.   On: 07/28/2022 04:42   DG Chest Port 1 View  Result Date: 07/28/2022 CLINICAL  DATA:  Sepsis.  Fever and shortness of breath. EXAM: PORTABLE CHEST 1 VIEW COMPARISON:  06/26/2022. FINDINGS: Heart is enlarged in the mediastinal contour is within normal limits. A prosthetic cardiac valve and left atrial appendage clip are noted. There is atherosclerotic calcification of the aorta. The pulmonary vasculature is distended. Diffuse hazy airspace opacities are noted in the lungs bilaterally, greater on the right than on the left. There are small bilateral pleural effusions. No pneumothorax. Sternotomy wires are noted. No acute osseous abnormality. IMPRESSION: 1. Cardiomegaly with pulmonary vascular congestion. 2. Diffuse airspace opacities bilaterally, possible edema or infiltrate. 3. Small bilateral pleural effusions. Electronically Signed   By: Thornell Sartorius M.D.   On: 07/28/2022 02:41      Subjective: Patient seen and examined at bedside.  Wants to go home today.  Denies worsening shortness with, nausea, vomiting or chest pain.  Discharge Exam: Vitals:   08/03/22 0607 08/03/22 0816  BP: 123/69 111/67  Pulse: 70 75  Resp: 17   Temp: 98.4 F (36.9 C)   SpO2: 97%     General: Pt is alert, awake, not in acute distress.  Elderly male sitting on chair.  On room air. Cardiovascular: rate controlled, S1/S2 + Respiratory: bilateral decreased breath sounds at bases with some scattered crackles Abdominal: Soft, NT, ND, bowel sounds +  Extremities: Trace lower extremity edema; no cyanosis    The results of significant diagnostics from this hospitalization (including imaging, microbiology, ancillary and laboratory) are listed below for reference.     Microbiology: Recent Results (from the past 240 hour(s))  Culture, blood (Routine x 2)     Status: Abnormal   Collection Time: 07/28/22  2:12 AM   Specimen: BLOOD  Result Value Ref Range Status   Specimen Description   Final    BLOOD RIGHT ANTECUBITAL Performed at [facility], [address],  [place], [place] [address]    Special Requests   Final    BOTTLES DRAWN AEROBIC AND ANAEROBIC Blood Culture adequate volume Performed at [facility], [address] [place], [place] [address]    Culture  Setup Time   Final    GRAM POSITIVE COCCI IN BOTH AEROBIC AND ANAEROBIC BOTTLES CRITICAL VALUE NOTED.  VALUE IS CONSISTENT WITH PREVIOUSLY REPORTED AND CALLED VALUE.    Culture (A)  Final    STREPTOCOCCUS SANGUINIS SUSCEPTIBILITIES PERFORMED ON PREVIOUS CULTURE WITHIN THE LAST 5 DAYS. Performed at [facility], [address] [place], [place] [address]    Report Status 07/30/2022 FINAL  Final  Culture, blood (Routine x 2)     Status: Abnormal   Collection Time: 07/28/22  3:04 AM   Specimen:  BLOOD LEFT FOREARM  Result Value Ref Range Status   Specimen Description   Final    BLOOD LEFT FOREARM Performed at [facility], [address], [place], [place] [address]    Special Requests   Final    BOTTLES DRAWN AEROBIC AND ANAEROBIC Blood Culture adequate volume Performed at [facility], [address], [place], [place] [address]    Culture  Setup Time   Final    GRAM POSITIVE COCCI IN CHAINS IN BOTH AEROBIC AND ANAEROBIC BOTTLES CRITICAL RESULT CALLED TO, READ BACK BY AND VERIFIED WITH: L POINDEXTER,[email or web address]  07/29/22 MK Performed at [facility], [address], [place], [place] [address]    Culture STREPTOCOCCUS SANGUINIS (A)  Final   Report Status 07/30/2022 FINAL  Final   Organism ID, Bacteria STREPTOCOCCUS SANGUINIS  Final      Susceptibility   Streptococcus sanguinis - MIC*    PENICILLIN 0.12 SENSITIVE Sensitive     CEFTRIAXONE <=0.12 SENSITIVE Sensitive     ERYTHROMYCIN <=0.12 SENSITIVE Sensitive     LEVOFLOXACIN 1 SENSITIVE Sensitive     VANCOMYCIN 0.5 SENSITIVE Sensitive     * STREPTOCOCCUS SANGUINIS  Blood Culture ID Panel (Reflexed)     Status: Abnormal   Collection Time: 07/28/22  3:04 AM  Result Value Ref  Range Status   Enterococcus faecalis NOT DETECTED NOT DETECTED Final   Enterococcus Faecium NOT DETECTED NOT DETECTED Final   Listeria monocytogenes NOT DETECTED NOT DETECTED Final   Staphylococcus species NOT DETECTED NOT DETECTED Final   Staphylococcus aureus (BCID) NOT DETECTED NOT DETECTED Final   Staphylococcus epidermidis NOT DETECTED NOT DETECTED Final   Staphylococcus lugdunensis NOT DETECTED NOT DETECTED Final   Streptococcus species DETECTED (A) NOT DETECTED Final    Comment: Not Enterococcus species, Streptococcus agalactiae, Streptococcus pyogenes, or Streptococcus pneumoniae. CRITICAL RESULT CALLED TO, READ BACK BY AND VERIFIED WITH: L POINDEXTER,[email or web address]  07/29/22 MK    Streptococcus agalactiae NOT DETECTED NOT DETECTED Final   Streptococcus pneumoniae NOT DETECTED NOT DETECTED Final   Streptococcus pyogenes NOT DETECTED NOT DETECTED Final   A.calcoaceticus-baumannii NOT DETECTED NOT DETECTED Final   Bacteroides fragilis NOT DETECTED NOT DETECTED Final   Enterobacterales NOT DETECTED NOT DETECTED Final   Enterobacter cloacae complex NOT DETECTED NOT DETECTED Final   Escherichia coli NOT DETECTED NOT DETECTED Final   Klebsiella aerogenes NOT DETECTED NOT DETECTED Final   Klebsiella oxytoca NOT DETECTED NOT DETECTED Final   Klebsiella pneumoniae NOT DETECTED NOT DETECTED Final   Proteus species NOT DETECTED NOT DETECTED Final   Salmonella species NOT DETECTED NOT DETECTED Final   Serratia marcescens NOT DETECTED NOT DETECTED Final   Haemophilus influenzae NOT DETECTED NOT DETECTED Final   Neisseria meningitidis NOT DETECTED NOT DETECTED Final   Pseudomonas aeruginosa NOT DETECTED NOT DETECTED Final   Stenotrophomonas maltophilia NOT DETECTED NOT DETECTED Final   Candida albicans NOT DETECTED NOT DETECTED Final   Candida auris NOT DETECTED NOT DETECTED Final   Candida glabrata NOT DETECTED NOT DETECTED Final   Candida krusei NOT DETECTED NOT DETECTED Final   Candida  parapsilosis NOT DETECTED NOT DETECTED Final   Candida tropicalis NOT DETECTED NOT DETECTED Final   Cryptococcus neoformans/gattii NOT DETECTED NOT DETECTED Final    Comment: Performed at [facility], [address] [place], [place] [address]  Resp panel by RT-PCR (RSV, Flu A&B, Covid) Peripheral     Status: None   Collection Time: 07/28/22  3:07 AM  Specimen: Peripheral; Nasal Swab  Result Value Ref Range Status   SARS Coronavirus 2 by RT PCR NEGATIVE NEGATIVE Final    Comment: (NOTE) SARS-CoV-2 target nucleic acids are NOT DETECTED.  The SARS-CoV-2 RNA is generally detectable in upper respiratory specimens during the acute phase of infection. The lowest concentration of SARS-CoV-2 viral copies this assay can detect is 138 copies/mL. A negative result does not preclude SARS-Cov-2 infection and should not be used as the sole basis for treatment or other patient management decisions. A negative result may occur with  improper specimen collection/handling, submission of specimen other than nasopharyngeal swab, presence of viral mutation(s) within the areas targeted by this assay, and inadequate number of viral copies(<138 copies/mL). A negative result must be combined with clinical observations, patient history, and epidemiological information. The expected result is Negative.  Fact Sheet for Patients:  BloggerCourse.com  Fact Sheet for Healthcare Providers:  SeriousBroker.it  This test is no t yet approved or cleared by the [place] FDA and  has been authorized for detection and/or diagnosis of SARS-CoV-2 by FDA under an Emergency Use Authorization (EUA). This EUA will remain  in effect (meaning this test can be used) for the duration of the COVID-19 declaration under Section 564(b)(1) of the Act, 21 U.S.C.section 360bbb-3(b)(1), unless the authorization is terminated  or revoked sooner.       Influenza A by PCR  NEGATIVE NEGATIVE Final   Influenza B by PCR NEGATIVE NEGATIVE Final    Comment: (NOTE) The Xpert Xpress SARS-CoV-2/FLU/RSV plus assay is intended as an aid in the diagnosis of influenza from Nasopharyngeal swab specimens and should not be used as a sole basis for treatment. Nasal washings and aspirates are unacceptable for Xpert Xpress SARS-CoV-2/FLU/RSV testing.  Fact Sheet for Patients: BloggerCourse.com  Fact Sheet for Healthcare Providers: SeriousBroker.it  This test is not yet approved or cleared by the [place] FDA and has been authorized for detection and/or diagnosis of SARS-CoV-2 by FDA under an Emergency Use Authorization (EUA). This EUA will remain in effect (meaning this test can be used) for the duration of the COVID-19 declaration under Section 564(b)(1) of the Act, 21 U.S.C. section 360bbb-3(b)(1), unless the authorization is terminated or revoked.     Resp Syncytial Virus by PCR NEGATIVE NEGATIVE Final    Comment: (NOTE) Fact Sheet for Patients: BloggerCourse.com  Fact Sheet for Healthcare Providers: SeriousBroker.it  This test is not yet approved or cleared by the [place] FDA and has been authorized for detection and/or diagnosis of SARS-CoV-2 by FDA under an Emergency Use Authorization (EUA). This EUA will remain in effect (meaning this test can be used) for the duration of the COVID-19 declaration under Section 564(b)(1) of the Act, 21 U.S.C. section 360bbb-3(b)(1), unless the authorization is terminated or revoked.  Performed at [facility], [address], [place], [place] [address]   Urine Culture     Status: Abnormal   Collection Time: 07/28/22  5:56 AM   Specimen: Urine, Random  Result Value Ref Range Status   Specimen Description   Final    URINE, RANDOM Performed at [facility], [address], [place], [place] [address]    Special Requests   Final    NONE Reflexed from [id number] Performed at [facility], [address] [place], [place] [address]    Culture (A)  Final    <10,000 COLONIES/mL INSIGNIFICANT GROWTH Performed at [facility], [address] [place], [place]  [address]    Report Status 07/29/2022 FINAL  Final  MRSA Next Gen by PCR, Nasal     Status: Abnormal   Collection Time: 07/28/22 12:51 PM   Specimen: Nasal Mucosa; Nasal Swab  Result Value Ref Range Status   MRSA by PCR Next Gen DETECTED (A) NOT DETECTED Final    Comment: (NOTE) The GeneXpert MRSA Assay (FDA approved for NASAL specimens only), is one component of a comprehensive MRSA colonization surveillance program. It is not intended to diagnose MRSA infection nor to guide or monitor treatment for MRSA infections. Test performance is not FDA approved in patients less than 17 years old. Performed at [facility], [address] [place], [place] [address]   Culture, blood (Routine X 2) w Reflex to ID Panel     Status: None (Preliminary result)   Collection Time: 07/30/22  1:44 AM   Specimen: BLOOD LEFT ARM  Result Value Ref Range Status   Specimen Description   Final    BLOOD LEFT ARM BOTTLES DRAWN AEROBIC AND ANAEROBIC Performed at [facility], [address], [place], [place] [address]    Special Requests   Final    Blood Culture adequate volume Performed at [facility], [address], [place], [place] [address]    Culture   Final    NO GROWTH 4 DAYS Performed at [facility], [address], [place], [place] [address]    Report Status PENDING  Incomplete  Culture, blood (Routine X 2) w Reflex to ID Panel     Status: None (Preliminary result)   Collection Time: 07/30/22  1:44 AM   Specimen: BLOOD LEFT HAND  Result Value Ref Range Status   Specimen Description   Final    BLOOD LEFT HAND BOTTLES DRAWN  AEROBIC AND ANAEROBIC Performed at [facility], [address] [place] [address], [place], [place] [address]    Special Requests   Final    Blood Culture adequate volume Performed at [facility], [address] [place], [place] [address]    Culture   Final    NO GROWTH 4 DAYS Performed at [facility], [address], [place], [place] [address]    Report Status PENDING  Incomplete     Labs: BNP (last 3 results) Recent Labs    07/28/22 0209 08/01/22 1859 08/03/22 0421  BNP 1,615.2* 1,133.0* 992.4*   Basic Metabolic Panel: Recent Labs  Lab 07/28/22 0209 07/29/22 0313 07/30/22 0915 08/02/22 0931 08/03/22 0421  NA 129* 129* 131* 133* 136  K 4.1 4.0 3.8 4.5 4.5  CL 97* 98 94* 99 102  CO2 23 24 28 27 27   GLUCOSE 145* 133* 113* 125* 106*  BUN 29* 33* 27* 13 15  CREATININE 0.84 0.83 1.01 0.84 0.86  CALCIUM 8.2* 8.3* 8.7* 8.5* 8.8*  MG  --   --  1.9  --   --   PHOS  --   --  2.9  --   --    Liver Function Tests: Recent Labs  Lab 07/28/22 0209  AST 28  ALT 32  ALKPHOS 64  BILITOT 1.0  PROT 5.9*  ALBUMIN 2.6*   No results for input(s): "LIPASE", "AMYLASE" in the last 168 hours. No results for input(s): "AMMONIA" in the last 168 hours. CBC: Recent Labs  Lab 07/28/22 0209 07/29/22 0313 08/02/22 0931  WBC 10.5 12.0* 7.6  NEUTROABS 9.5*  --   --   HGB  9.1* 8.6* 8.5*  HCT 27.6* 26.2* 27.2*  MCV 93.6 92.9 97.1  PLT 121* 124* 141*   Cardiac Enzymes: No results for input(s): "CKTOTAL", "CKMB", "CKMBINDEX", "TROPONINI" in the last 168 hours. BNP: Invalid input(s): "POCBNP" CBG: Recent Labs  Lab 07/28/22 1257  GLUCAP 154*   D-Dimer No results for input(s): "DDIMER" in the last 72 hours. Hgb A1c No results for input(s): "HGBA1C" in the last 72 hours. Lipid Profile Recent Labs    08/03/22 0421  CHOL 212*  HDL 33*  LDLCALC 161*  TRIG 90  CHOLHDL 6.4  LDLDIRECT 155*   Thyroid function studies No results for input(s):  "TSH", "T4TOTAL", "T3FREE", "THYROIDAB" in the last 72 hours.  Invalid input(s): "FREET3" Anemia work up No results for input(s): "VITAMINB12", "FOLATE", "FERRITIN", "TIBC", "IRON", "RETICCTPCT" in the last 72 hours. Urinalysis    Component Value Date/Time   COLORURINE YELLOW 07/28/2022 0556   APPEARANCEUR CLOUDY (A) 07/28/2022 0556   LABSPEC 1.016 07/28/2022 0556   PHURINE 5.0 07/28/2022 0556   GLUCOSEU NEGATIVE 07/28/2022 0556   HGBUR NEGATIVE 07/28/2022 0556   BILIRUBINUR NEGATIVE 07/28/2022 0556   KETONESUR 5 (A) 07/28/2022 0556   PROTEINUR NEGATIVE 07/28/2022 0556   NITRITE POSITIVE (A) 07/28/2022 0556   LEUKOCYTESUR LARGE (A) 07/28/2022 0556   Sepsis Labs Recent Labs  Lab 07/28/22 0209 07/29/22 0313 08/02/22 0931  WBC 10.5 12.0* 7.6   Microbiology Recent Results (from the past 240 hour(s))  Culture, blood (Routine x 2)     Status: Abnormal   Collection Time: 07/28/22  2:12 AM   Specimen: BLOOD  Result Value Ref Range Status   Specimen Description   Final    BLOOD RIGHT ANTECUBITAL Performed at [facility], [address] [place], [place] [address]    Special Requests   Final    BOTTLES DRAWN AEROBIC AND ANAEROBIC Blood Culture adequate volume Performed at [facility], [address], [place], [place] [address]    Culture  Setup Time   Final    GRAM POSITIVE COCCI IN BOTH AEROBIC AND ANAEROBIC BOTTLES CRITICAL VALUE NOTED.  VALUE IS CONSISTENT WITH PREVIOUSLY REPORTED AND CALLED VALUE.    Culture (A)  Final    STREPTOCOCCUS SANGUINIS SUSCEPTIBILITIES PERFORMED ON PREVIOUS CULTURE WITHIN THE LAST 5 DAYS. Performed at [facility], [address], [place], [place] [address]    Report Status 07/30/2022 FINAL  Final  Culture, blood (Routine x 2)     Status: Abnormal   Collection Time: 07/28/22  3:04 AM   Specimen: BLOOD LEFT FOREARM  Result Value Ref Range Status   Specimen Description   Final    BLOOD LEFT  FOREARM Performed at [facility], [address], [place], [place] [address]    Special Requests   Final    BOTTLES DRAWN AEROBIC AND ANAEROBIC Blood Culture adequate volume Performed at [facility], [address], [place], [place] [address]    Culture  Setup Time   Final    GRAM POSITIVE COCCI IN CHAINS IN BOTH AEROBIC AND ANAEROBIC BOTTLES CRITICAL RESULT CALLED TO, READ BACK BY AND VERIFIED WITH: L POINDEXTER,[email or web address]  07/29/22 MK Performed at [facility], [address], [place], [place] [address]    Culture STREPTOCOCCUS SANGUINIS (A)  Final   Report Status 07/30/2022 FINAL  Final   Organism ID, Bacteria STREPTOCOCCUS SANGUINIS  Final      Susceptibility   Streptococcus sanguinis - MIC*  PENICILLIN 0.12 SENSITIVE Sensitive     CEFTRIAXONE <=0.12 SENSITIVE Sensitive     ERYTHROMYCIN <=0.12 SENSITIVE Sensitive     LEVOFLOXACIN 1 SENSITIVE Sensitive     VANCOMYCIN 0.5 SENSITIVE Sensitive     * STREPTOCOCCUS SANGUINIS  Blood Culture ID Panel (Reflexed)     Status: Abnormal   Collection Time: 07/28/22  3:04 AM  Result Value Ref Range Status   Enterococcus faecalis NOT DETECTED NOT DETECTED Final   Enterococcus Faecium NOT DETECTED NOT DETECTED Final   Listeria monocytogenes NOT DETECTED NOT DETECTED Final   Staphylococcus species NOT DETECTED NOT DETECTED Final   Staphylococcus aureus (BCID) NOT DETECTED NOT DETECTED Final   Staphylococcus epidermidis NOT DETECTED NOT DETECTED Final   Staphylococcus lugdunensis NOT DETECTED NOT DETECTED Final   Streptococcus species DETECTED (A) NOT DETECTED Final    Comment: Not Enterococcus species, Streptococcus agalactiae, Streptococcus pyogenes, or Streptococcus pneumoniae. CRITICAL RESULT CALLED TO, READ BACK BY AND VERIFIED WITH: L POINDEXTER,[email or web address]  07/29/22 MK    Streptococcus agalactiae NOT DETECTED NOT DETECTED Final   Streptococcus pneumoniae NOT DETECTED NOT DETECTED Final    Streptococcus pyogenes NOT DETECTED NOT DETECTED Final   A.calcoaceticus-baumannii NOT DETECTED NOT DETECTED Final   Bacteroides fragilis NOT DETECTED NOT DETECTED Final   Enterobacterales NOT DETECTED NOT DETECTED Final   Enterobacter cloacae complex NOT DETECTED NOT DETECTED Final   Escherichia coli NOT DETECTED NOT DETECTED Final   Klebsiella aerogenes NOT DETECTED NOT DETECTED Final   Klebsiella oxytoca NOT DETECTED NOT DETECTED Final   Klebsiella pneumoniae NOT DETECTED NOT DETECTED Final   Proteus species NOT DETECTED NOT DETECTED Final   Salmonella species NOT DETECTED NOT DETECTED Final   Serratia marcescens NOT DETECTED NOT DETECTED Final   Haemophilus influenzae NOT DETECTED NOT DETECTED Final   Neisseria meningitidis NOT DETECTED NOT DETECTED Final   Pseudomonas aeruginosa NOT DETECTED NOT DETECTED Final   Stenotrophomonas maltophilia NOT DETECTED NOT DETECTED Final   Candida albicans NOT DETECTED NOT DETECTED Final   Candida auris NOT DETECTED NOT DETECTED Final   Candida glabrata NOT DETECTED NOT DETECTED Final   Candida krusei NOT DETECTED NOT DETECTED Final   Candida parapsilosis NOT DETECTED NOT DETECTED Final   Candida tropicalis NOT DETECTED NOT DETECTED Final   Cryptococcus neoformans/gattii NOT DETECTED NOT DETECTED Final    Comment: Performed at [facility], [address], [place], [place] [address]  Resp panel by RT-PCR (RSV, Flu A&B, Covid) Peripheral     Status: None   Collection Time: 07/28/22  3:07 AM   Specimen: Peripheral; Nasal Swab  Result Value Ref Range Status   SARS Coronavirus 2 by RT PCR NEGATIVE NEGATIVE Final    Comment: (NOTE) SARS-CoV-2 target nucleic acids are NOT DETECTED.  The SARS-CoV-2 RNA is generally detectable in upper respiratory specimens during the acute phase of infection. The lowest concentration of SARS-CoV-2 viral copies this assay can detect is 138 copies/mL. A negative result does not preclude SARS-Cov-2 infection and  should not be used as the sole basis for treatment or other patient management decisions. A negative result may occur with  improper specimen collection/handling, submission of specimen other than nasopharyngeal swab, presence of viral mutation(s) within the areas targeted by this assay, and inadequate number of viral copies(<138 copies/mL). A negative result must be combined with clinical observations, patient history, and epidemiological information. The expected result is Negative.  Fact Sheet for Patients:  BloggerCourse.com  Fact Sheet for Healthcare Providers:  SeriousBroker.it  This test  is no t yet approved or cleared by the [place] and  has been authorized for detection and/or diagnosis of SARS-CoV-2 by FDA under an Emergency Use Authorization (EUA). This EUA will remain  in effect (meaning this test can be used) for the duration of the COVID-19 declaration under Section 564(b)(1) of the Act, 21 U.S.C.section 360bbb-3(b)(1), unless the authorization is terminated  or revoked sooner.       Influenza A by PCR NEGATIVE NEGATIVE Final   Influenza B by PCR NEGATIVE NEGATIVE Final    Comment: (NOTE) The Xpert Xpress SARS-CoV-2/FLU/RSV plus assay is intended as an aid in the diagnosis of influenza from Nasopharyngeal swab specimens and should not be used as a sole basis for treatment. Nasal washings and aspirates are unacceptable for Xpert Xpress SARS-CoV-2/FLU/RSV testing.  Fact Sheet for Patients: BloggerCourse.com  Fact Sheet for Healthcare Providers: SeriousBroker.it  This test is not yet approved or cleared by the [place] FDA and has been authorized for detection and/or diagnosis of SARS-CoV-2 by FDA under an Emergency Use Authorization (EUA). This EUA will remain in effect (meaning this test can be used) for the duration of the COVID-19 declaration  under Section 564(b)(1) of the Act, 21 U.S.C. section 360bbb-3(b)(1), unless the authorization is terminated or revoked.     Resp Syncytial Virus by PCR NEGATIVE NEGATIVE Final    Comment: (NOTE) Fact Sheet for Patients: BloggerCourse.com  Fact Sheet for Healthcare Providers: SeriousBroker.it  This test is not yet approved or cleared by the [place] FDA and has been authorized for detection and/or diagnosis of SARS-CoV-2 by FDA under an Emergency Use Authorization (EUA). This EUA will remain in effect (meaning this test can be used) for the duration of the COVID-19 declaration under Section 564(b)(1) of the Act, 21 U.S.C. section 360bbb-3(b)(1), unless the authorization is terminated or revoked.  Performed at [facility], [address] [place], [place] [address]   Urine Culture     Status: Abnormal   Collection Time: 07/28/22  5:56 AM   Specimen: Urine, Random  Result Value Ref Range Status   Specimen Description   Final    URINE, RANDOM Performed at [facility], [address], [place], [place] [address]    Special Requests   Final    NONE Reflexed from [id number] Performed at [facility], [address], [place], [place] [address]    Culture (A)  Final    <10,000 COLONIES/mL INSIGNIFICANT GROWTH Performed at [facility], [address], [place], [place] [address]    Report Status 07/29/2022 FINAL  Final  MRSA Next Gen by PCR, Nasal     Status: Abnormal   Collection Time: 07/28/22 12:51 PM   Specimen: Nasal Mucosa; Nasal Swab  Result Value Ref Range Status   MRSA by PCR Next Gen DETECTED (A) NOT DETECTED Final    Comment: (NOTE) The GeneXpert MRSA Assay (FDA approved for NASAL specimens only), is one component of a comprehensive MRSA colonization surveillance program. It is not intended to diagnose MRSA infection nor to guide or monitor treatment for  MRSA infections. Test performance is not FDA approved in patients less than 68 years old. Performed at [facility], [address], [place], [place] [address]   Culture, blood (Routine X 2) w Reflex to ID Panel     Status: None (Preliminary result)   Collection Time: 07/30/22  1:44 AM   Specimen: BLOOD LEFT ARM  Result Value  Ref Range Status   Specimen Description   Final    BLOOD LEFT ARM BOTTLES DRAWN AEROBIC AND ANAEROBIC Performed at [facility], [address], [place], [place] [address]    Special Requests   Final    Blood Culture adequate volume Performed at [facility], [address] [place], [place] [address]    Culture   Final    NO GROWTH 4 DAYS Performed at [facility], [address] [place], [place] [address]    Report Status PENDING  Incomplete  Culture, blood (Routine X 2) w Reflex to ID Panel     Status: None (Preliminary result)   Collection Time: 07/30/22  1:44 AM   Specimen: BLOOD LEFT HAND  Result Value Ref Range Status   Specimen Description   Final    BLOOD LEFT HAND BOTTLES DRAWN AEROBIC AND ANAEROBIC Performed at [facility], [address] [place], [place] [address]    Special Requests   Final    Blood Culture adequate volume Performed at [facility], [address], [place], [place] [address]    Culture   Final    NO GROWTH 4 DAYS Performed at [facility], [address] [place], [place] [address]    Report Status PENDING  Incomplete     Time coordinating discharge: 35 minutes  SIGNED:   Glade Lloyd, MD  [facility] 08/03/2022, 12:55 PM

## 2022-08-03 NOTE — TOC Progression Note (Signed)
Transition of Care [facility]) - Progression Note    Patient Details  Name: [name] MRN: [id number] Date of Birth: May 03, 1940  Transition of Care [facility]) CM/SW Contact  Larrie Kass, LCSW Phone Number: 08/03/2022, 10:48 AM  Clinical Narrative:     CSW spoke with pt regarding IV Abx at home. Pt is from [place] independent living. Pt reports he has an aide who assists him in the home 8 hrs a day. Pt reports his son lives in [place] and is not able to assist. Pt's reports he will need transportation assistance back to [place] apartment. Pt has no DME needs. CSW spoke with Jeri Modena with [facility], she is following, pt's home health is arranged with [facility]. TOC to follow.  Expected Discharge Plan:  ([place] at [facility]) Barriers to Discharge: Continued Medical Work up  Expected Discharge Plan and Services In-house Referral: Clinical Social Work     Living arrangements for the past 2 months: Independent Living Facility ([facility])                           HH Arranged: PT, OT HH Agency: Other - See comment Transport planner) Date HH Agency Contacted: 07/29/22   Representative spoke with at [facility] Agency: Rene Kocher   Social Determinants of Health (SDOH) Interventions SDOH Screenings   Food Insecurity: No Food Insecurity (07/31/2022)  Housing: Low Risk  (07/31/2022)  Transportation Needs: No Transportation Needs (07/31/2022)  Utilities: Not At Risk (07/31/2022)  Alcohol Screen: Low Risk  (05/13/2022)  Depression (PHQ2-9): Low Risk  (07/19/2022)  Financial Resource Strain: Low Risk  (05/13/2022)  Physical Activity: Sufficiently Active (05/13/2022)  Social Connections: Socially Integrated (05/13/2022)  Stress: No Stress Concern Present (05/13/2022)  Tobacco Use: Medium Risk (08/02/2022)    Readmission Risk Interventions    07/29/2022    3:31 PM  Readmission Risk Prevention Plan  Transportation Screening Complete  PCP or Specialist Appt within 5-7 Days  Complete  Home Care Screening Complete  Medication Review (RN CM) Complete

## 2022-08-03 NOTE — Plan of Care (Signed)
  Problem: Clinical Measurements: Goal: Ability to maintain clinical measurements within normal limits will improve Outcome: Progressing Goal: Diagnostic test results will improve Outcome: Progressing   

## 2022-08-04 ENCOUNTER — Telehealth: Payer: Self-pay

## 2022-08-04 DIAGNOSIS — T826XXA Infection and inflammatory reaction due to cardiac valve prosthesis, initial encounter: Secondary | ICD-10-CM | POA: Diagnosis not present

## 2022-08-04 LAB — CULTURE, BLOOD (ROUTINE X 2)
Culture: NO GROWTH
Culture: NO GROWTH
Special Requests: ADEQUATE
Special Requests: ADEQUATE

## 2022-08-04 NOTE — Transitions of Care (Post Inpatient/ED Visit) (Signed)
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 Caregiver currently in the home with him.) Any questions or concerns?: Yes Patient Questions/Concerns:: Pt states he" did not receive delivery of IV abx on ice last night at 7pm" as he was informed of prior to d/c Patient Questions/Concerns Addressed: Other: (RN CM contacted [facility] to follow up on IV abxs-delivery to be made by 4pm-informed that pt only scheduled to get IV abx 1/day. Infusion RN will be instructing caregiver-Debbie on how to adminster. She will call and discuss this with pt.)  Items Reviewed: Did you receive and understand the discharge instructions provided?: Yes Medications obtained,verified, and reconciled?: Yes (Medications Reviewed) Any new allergies since your discharge?: No Dietary orders reviewed?: Yes Type of Diet Ordered:: low salt/heart healthy Do you have support at home?: Yes People in Home: alone Name of Support/Comfort Primary Source: pt resides at [facility]-has caregiver in the home several hrs/day  Medications Reviewed Today: Medications Reviewed Today     Reviewed by Charlyn Minerva, RN (Registered Nurse) on 08/04/22 at 1120  Med List Status: <None>   Medication Order Taking? Sig Documenting Provider Last Dose Status Informant  acetaminophen (TYLENOL) 500 MG tablet [id number] Yes Take 2 tablets (1,000 mg total) by mouth every 6 (six) hours as needed for mild pain  or fever. Barrett, Rae Roam, PA-C Taking Active Self, Pharmacy Records  ascorbic acid (VITAMIN C) 500 MG tablet [id number] Yes Take 1 tablet (500 mg total) by mouth daily. Ngetich, Donalee Citrin, NP Taking Active Self, Pharmacy Records  aspirin EC 81 MG tablet [id number] Yes Take 81 mg by mouth daily. Swallow whole. [provider] Taking Active Self, Pharmacy Records  atorvastatin (LIPITOR) 40 MG tablet [id number] Yes Take 1 tablet (40 mg total) by mouth daily. Glade Lloyd, MD Taking Active   cefTRIAXone (ROCEPHIN) IVPB [id number]  Inject 2 g into the vein daily. Indication:  Endocarditis First Dose: No Last Day of Therapy:  09/10/22 Labs - Once weekly:  CBC/D and BMP, Labs - Once weekly: ESR and CRP Method of administration: IV Push Method of administration may be changed at the discretion of home infusion pharmacist based upon assessment of the patient and/or caregiver's ability to self-administer the medication ordered. Odette Fraction, MD  Active   Cetirizine HCl (ZYRTEC ALLERGY) 10 MG CAPS [id number] Yes Take 10 mg by mouth daily as needed (as needed for allergies). [provider] Taking Active Self, Pharmacy Records  diclofenac Sodium (VOLTAREN) 1 % GEL [id number] Yes Apply 2 g topically 4 (four) times daily. To low back Leroy Sea, MD Taking Active Self, Pharmacy Records  empagliflozin (JARDIANCE) 10 MG TABS tablet [id number] Yes Take 1 tablet (10 mg total) by mouth daily. Glade Lloyd, MD Taking Active   lactulose (CEPHULAC) 20 g packet [id number] Yes Take 1 packet (20 g total) by mouth 3 (three) times daily as needed (constipation). Leroy Sea, MD Taking Active Self, Pharmacy Records  lidocaine (LIDODERM) 5 % [id number] Yes Place 1 patch onto the skin daily. Remove & Discard patch within 12 hours or  as directed by MD Leroy Sea, MD Taking Active Self, Pharmacy Records  losartan (COZAAR) 25 MG tablet [id number] Yes Take 1 tablet (25 mg total) by mouth every  evening. Glade Lloyd, MD Taking Active   metoprolol succinate (TOPROL-XL) 25 MG 24 hr tablet [id number] Yes Take 0.5 tablets (12.5 mg total) by mouth daily. Glade Lloyd, MD Taking Active   Multiple Vitamin (MULTIVITAMIN ADULT PO) [id number] Yes Take 1 tablet by mouth daily. [provider] Taking Active Self, Pharmacy Records  MYRBETRIQ 25 MG TB24 tablet [id number] Yes Take 25 mg by mouth daily. [provider] Taking Active Self, Pharmacy Records  Omega-3 Fatty Acids (FISH OIL) 1000 MG CAPS [id number] Yes Take 2 capsules by mouth daily. [provider] Taking Active Self, Pharmacy Records  silodosin (RAPAFLO) 8 MG CAPS capsule [id number] Yes Take 8 mg by mouth daily. [provider] Taking Active Self, Pharmacy Records  torsemide (DEMADEX) 10 MG tablet [id number] Yes Take 1 tablet (10 mg total) by mouth daily. Glade Lloyd, MD Taking Active             Home Care and Equipment/Supplies: Were Home Health Services Ordered?: Yes Name of Home Health Agency:: [facility] through facility Has Agency set up a time to come to your home?: No EMR reviewed for Home Health Orders:  (Patient aware to follow up if no call received within 48hrs post discharge) Any new equipment or medical supplies ordered?: Yes Name of Medical supply agency?: [facility]-IV [facility]xs Were you able to get the equipment/medical supplies?: No (RN CM contacted supplier and confirmed med to be delivered later today) Do you have any questions related to the use of the equipment/supplies?: No  Functional Questionnaire: Do you need assistance with bathing/showering or dressing?: No Do you need assistance with meal preparation?: No Do you need assistance with eating?: No Do you have difficulty maintaining continence: No Do you need assistance with getting out of bed/getting out of a chair/moving?: No Do you have difficulty managing or taking your medications?: No  Follow up  appointments reviewed: PCP Follow-up appointment confirmed?: No (pt states caregiver makes appts for him-will have her call-declined making appt during this call) MD Provider Line Number:[phone] Given: No Specialist Hospital Follow-up appointment confirmed?: Yes Date of Specialist follow-up appointment?: 08/09/22 Follow-Up Specialty Provider:: Dr. Rosemary Holms, Dr. Manandhar-08/30/22 Do you need transportation to your follow-up appointment?: No Do you understand care options if your condition(s) worsen?: Yes-patient verbalized understanding  SDOH Interventions Today    Flowsheet Row Most Recent Value  SDOH Interventions   Food Insecurity Interventions Intervention Not Indicated      TOC Interventions Today    Flowsheet Row Most Recent Value  TOC Interventions   TOC Interventions Discussed/Reviewed TOC Interventions Discussed      Interventions Today    Flowsheet Row Most Recent Value  General Interventions   General Interventions Discussed/Reviewed General Interventions Discussed, Doctor Visits, Communication with  Doctor Visits Discussed/Reviewed Doctor Visits Discussed, Specialist, PCP  PCP/Specialist Visits Compliance with follow-up visit  Communication with --  [Contacted [facility]-[phone]-spoke w/ Ariel-advised IV abx to be delivered today by 4pm. Contacted Pam Chandler-[facility] RN-[phone]-advised of the same-she will f/u with pt to explain and confirm appt to teach caregiver-Debbie]  Education Interventions   Education Provided Provided Education  Provided Verbal Education On Nutrition, When to see the doctor, Medication  Nutrition Interventions   Nutrition Discussed/Reviewed Nutrition Discussed, Adding fruits and vegetables, Decreasing salt, Fluid intake, Increasing proteins  Pharmacy Interventions  Pharmacy Dicussed/Reviewed Pharmacy Topics Discussed, Medications and their functions  Safety Interventions   Safety Discussed/Reviewed Safety  Discussed       Alessandra Grout [facility] Care Management Care Management Community Coordinator Direct Phone: [phone] Toll Free: [phone] Fax: [phone]

## 2022-08-05 DIAGNOSIS — J189 Pneumonia, unspecified organism: Secondary | ICD-10-CM | POA: Diagnosis not present

## 2022-08-05 DIAGNOSIS — T826XXA Infection and inflammatory reaction due to cardiac valve prosthesis, initial encounter: Secondary | ICD-10-CM | POA: Diagnosis not present

## 2022-08-05 DIAGNOSIS — I110 Hypertensive heart disease with heart failure: Secondary | ICD-10-CM | POA: Diagnosis not present

## 2022-08-05 DIAGNOSIS — I5033 Acute on chronic diastolic (congestive) heart failure: Secondary | ICD-10-CM | POA: Diagnosis not present

## 2022-08-05 DIAGNOSIS — I5021 Acute systolic (congestive) heart failure: Secondary | ICD-10-CM | POA: Diagnosis not present

## 2022-08-05 DIAGNOSIS — S51801D Unspecified open wound of right forearm, subsequent encounter: Secondary | ICD-10-CM | POA: Diagnosis not present

## 2022-08-05 DIAGNOSIS — R7881 Bacteremia: Secondary | ICD-10-CM | POA: Diagnosis not present

## 2022-08-05 DIAGNOSIS — J9601 Acute respiratory failure with hypoxia: Secondary | ICD-10-CM | POA: Diagnosis not present

## 2022-08-05 DIAGNOSIS — B955 Unspecified streptococcus as the cause of diseases classified elsewhere: Secondary | ICD-10-CM | POA: Diagnosis not present

## 2022-08-08 DIAGNOSIS — I110 Hypertensive heart disease with heart failure: Secondary | ICD-10-CM | POA: Diagnosis not present

## 2022-08-08 DIAGNOSIS — Z5181 Encounter for therapeutic drug level monitoring: Secondary | ICD-10-CM | POA: Diagnosis not present

## 2022-08-08 DIAGNOSIS — B955 Unspecified streptococcus as the cause of diseases classified elsewhere: Secondary | ICD-10-CM | POA: Diagnosis not present

## 2022-08-08 DIAGNOSIS — T826XXA Infection and inflammatory reaction due to cardiac valve prosthesis, initial encounter: Secondary | ICD-10-CM | POA: Diagnosis not present

## 2022-08-08 DIAGNOSIS — Z792 Long term (current) use of antibiotics: Secondary | ICD-10-CM | POA: Diagnosis not present

## 2022-08-08 DIAGNOSIS — J9601 Acute respiratory failure with hypoxia: Secondary | ICD-10-CM | POA: Diagnosis not present

## 2022-08-08 DIAGNOSIS — S51801D Unspecified open wound of right forearm, subsequent encounter: Secondary | ICD-10-CM | POA: Diagnosis not present

## 2022-08-08 DIAGNOSIS — R7881 Bacteremia: Secondary | ICD-10-CM | POA: Diagnosis not present

## 2022-08-08 DIAGNOSIS — I5033 Acute on chronic diastolic (congestive) heart failure: Secondary | ICD-10-CM | POA: Diagnosis not present

## 2022-08-08 DIAGNOSIS — I5021 Acute systolic (congestive) heart failure: Secondary | ICD-10-CM | POA: Diagnosis not present

## 2022-08-08 DIAGNOSIS — J189 Pneumonia, unspecified organism: Secondary | ICD-10-CM | POA: Diagnosis not present

## 2022-08-09 ENCOUNTER — Encounter: Payer: Self-pay | Admitting: Cardiology

## 2022-08-09 ENCOUNTER — Ambulatory Visit: Payer: Medicare PPO | Admitting: Cardiology

## 2022-08-09 VITALS — BP 86/53 | HR 75 | Resp 16 | Ht 71.0 in | Wt 151.8 lb

## 2022-08-09 DIAGNOSIS — T826XXA Infection and inflammatory reaction due to cardiac valve prosthesis, initial encounter: Secondary | ICD-10-CM

## 2022-08-09 DIAGNOSIS — I5033 Acute on chronic diastolic (congestive) heart failure: Secondary | ICD-10-CM | POA: Diagnosis not present

## 2022-08-09 DIAGNOSIS — J189 Pneumonia, unspecified organism: Secondary | ICD-10-CM | POA: Diagnosis not present

## 2022-08-09 DIAGNOSIS — J9601 Acute respiratory failure with hypoxia: Secondary | ICD-10-CM | POA: Diagnosis not present

## 2022-08-09 DIAGNOSIS — I38 Endocarditis, valve unspecified: Secondary | ICD-10-CM | POA: Diagnosis not present

## 2022-08-09 DIAGNOSIS — I110 Hypertensive heart disease with heart failure: Secondary | ICD-10-CM | POA: Diagnosis not present

## 2022-08-09 DIAGNOSIS — B955 Unspecified streptococcus as the cause of diseases classified elsewhere: Secondary | ICD-10-CM | POA: Diagnosis not present

## 2022-08-09 DIAGNOSIS — E861 Hypovolemia: Secondary | ICD-10-CM | POA: Insufficient documentation

## 2022-08-09 DIAGNOSIS — R7881 Bacteremia: Secondary | ICD-10-CM | POA: Diagnosis not present

## 2022-08-09 DIAGNOSIS — E871 Hypo-osmolality and hyponatremia: Secondary | ICD-10-CM | POA: Diagnosis not present

## 2022-08-09 DIAGNOSIS — I5021 Acute systolic (congestive) heart failure: Secondary | ICD-10-CM | POA: Diagnosis not present

## 2022-08-09 DIAGNOSIS — S51801D Unspecified open wound of right forearm, subsequent encounter: Secondary | ICD-10-CM | POA: Diagnosis not present

## 2022-08-09 NOTE — Progress Notes (Signed)
Subjective:   [name], male    DOB: 08/19/1940, 82 y.o.   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  He was diuresed, treated with IV Rocephin, discharged with PICC line with plans for 6 weeks of antibiotics.  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    Patient is here with his caregiver.  He is ambulating in wheelchair/walker.  He feels he is getting stronger with regular occupational therapy/physical therapy.  He does notice occasional lightheadedness.  Blood pressure is low today, details below.  Denies any fever, chills.       Current Outpatient Medications:    acetaminophen (TYLENOL) 500 MG tablet, Take 2 tablets (1,000 mg total) by mouth every 6 (six) hours as needed for mild pain or fever., Disp: 30 tablet, Rfl: 0   ascorbic acid (VITAMIN C) 500 MG tablet, Take 1 tablet (500 mg total) by mouth daily., Disp: 30 tablet, Rfl: 11   aspirin EC 81 MG tablet, Take 81 mg by mouth daily. Swallow whole., Disp: , Rfl:    atorvastatin (LIPITOR) 40 MG tablet, Take 1 tablet (40 mg total) by mouth daily., Disp: 30 tablet, Rfl: 0   cefTRIAXone (ROCEPHIN) IVPB, Inject 2 g into the vein daily. Indication:  Endocarditis First Dose: No Last Day of Therapy:  09/10/22 Labs - Once weekly:  CBC/D and BMP, Labs - Once weekly: ESR and CRP Method of administration: IV Push Method of administration may be changed at the discretion of home infusion pharmacist based upon assessment of  the patient and/or caregiver's ability to self-administer the medication ordered., Disp: 38 Units, Rfl: 0   Cetirizine HCl (ZYRTEC ALLERGY) 10 MG CAPS, Take 10 mg by mouth daily as needed (as needed for allergies)., Disp: , Rfl:    empagliflozin (JARDIANCE) 10 MG TABS tablet, Take 1 tablet (10 mg total) by mouth daily., Disp: 30 tablet, Rfl: 0   lactulose (CEPHULAC) 20 g packet, Take 1 packet (20 g total) by mouth 3 (three) times daily as needed (constipation)., Disp: 30 each, Rfl: 0   losartan (COZAAR) 25 MG tablet, Take 1 tablet (25 mg total) by mouth every evening., Disp: 30 tablet, Rfl: 0   metoprolol succinate (TOPROL-XL) 25 MG 24 hr tablet, Take 0.5 tablets (12.5 mg total) by mouth daily., Disp: 30 tablet, Rfl: 0   Multiple Vitamin (MULTIVITAMIN ADULT PO), Take 1 tablet by mouth daily., Disp: , Rfl:    MYRBETRIQ 25 MG TB24 tablet, Take 25 mg by mouth daily., Disp: , Rfl:    Omega-3 Fatty Acids (FISH OIL) 1000 MG CAPS, Take 2 capsules by mouth daily., Disp: , Rfl:    silodosin (RAPAFLO) 8 MG CAPS capsule, Take 8 mg by mouth daily., Disp: , Rfl:    torsemide (DEMADEX) 10 MG tablet, Take 1 tablet (10 mg total) by mouth daily., Disp: 30 tablet, Rfl: 0   diclofenac Sodium (VOLTAREN) 1 % GEL, Apply 2 g topically 4 (four) times daily. To low back (Patient not taking: Reported on 08/09/2022), Disp:  50 g, Rfl: 0   lidocaine (LIDODERM) 5 %, Place 1 patch onto the skin daily. Remove & Discard patch within 12 hours or as directed by MD (Patient not taking: Reported on 08/09/2022), Disp: 1 patch, Rfl: 0   Cardiovascular studies:  EKG 08/09/2022: Sinus rhythm 79 bpm with sinus arrhtymia Right bundle branch block  TEE 08/01/2022:  1. Oscillating density on prosthetic aortic valve consistent with  vegetation.   2. Left ventricular ejection fraction, by estimation, is 45 to 50%. The  left ventricle has mildly decreased function. The left ventricle  demonstrates global hypokinesis.   3. Right ventricular  systolic function is normal. The right ventricular  size is normal.   4. LAA known to be ligated. Left atrial size was severely dilated.   5. Right atrial size was mildly dilated.   6. The mitral valve is normal in structure. Moderate mitral valve  regurgitation.   7. The aortic valve has been repaired/replaced. Aortic valve  regurgitation is not visualized. Moderate aortic valve stenosis. There is  a 23 mm Edwards inspiris resilia pericardial valve present in the aortic  position.   8. There is mild (Grade II) plaque involving the descending aorta.   Mobile cardiac telemetry 13 days 03/01/2021 - 03/15/2021: Dominant rhythm: Sinus. HR 42-125 bpm. Avg HR 71 bpm, in sinus rhythm. 15 episodes of SVT/probable AT, fastest at 148 bpm for 6 beats, longest for 19 beats at 96 bpm. 7.4% isolated SVE, <1% couplet/triplets. 4 episodes of VT, fastest at 144 bpm for 4 bpm, longest for 12 beats at 102 bpm. Some episodes of VT may be SVT with possible aberrancy. 8.3% isolated VE, <1% couplet/triplets. 1 episode of sinus pause 3.1 sec at 12:11 AM w/no reported symptoms.  No atrial fibrillation/atrial flutter/VT/high grade AV block, noted. 1 patient triggered event, correlated w/VE.  Echocardiogram 03/16/2021: Left ventricle cavity is normal in size and wall thickness. Abnormal septal wall motion due to post-operative valve. Normal LV systolic function with visual EF 50-55%. Doppler evidence of grade II (pseudonormal) diastolic dysfunction, elevated LAP.  Left atrial cavity is moderately dilated. Well seated bioprosthetic aortic valve. Mildly elevated mean PG 13 mmHg. No aortic valve regurgitation. Mild mitral valve leaflet thickening. Moderate (Grade II) mitral regurgitation. Mild tricuspid regurgitation.  Mild pulmonic regurgitation. No evidence of pulmonary hypertension. No significant change compared to previous study on 02/21/2020.  Cardiac surgery 07/24/2017: CABG (LIMA-LAD, SVG- dLCx,  SVG-RCA) Aortic valve replacement 23 mm Edwards Inspiris Resilia pericardial valve LAA clipping  Vascular ultrasound 07/11/2017: Bilateral carotid mild stenosis 1-39% Normal bilateral ABI  Recent labs: 02/23/2021: Glucose 93, BUN/Cr 11/0.8. EGFR 87. Chol 146, TG 76, HDL 58, LDL 73  08/08/2019: Glucose 098, BUN/Cr 14/0.65. EGFR >60. Na/K 132/4.3. Rest of the CMP normal H/H 11/33. MCV 95. Platelets 141  08/2018: Chol 139, TG 48, HDL 63, LDL 66    Review of Systems  Constitutional: Negative for malaise/fatigue.  Cardiovascular:  Negative for chest pain, dyspnea on exertion, leg swelling, palpitations and syncope.  Neurological:  Positive for light-headedness.         Today's Vitals   08/09/22 1505 08/09/22 1516 08/09/22 1518  BP: (!) 86/53    Pulse: 75    Resp: 16    SpO2: 98% 98% 97%  Weight: 151 lb 12.8 oz (68.9 kg)    Height: 5\' 11"  (1.803 m)     Body mass index is 21.17 kg/m.  Orthostatic VS for the past 72 hrs (Last 3 readings):  Orthostatic BP  Patient Position BP Location Cuff Size Orthostatic Pulse  08/09/22 1518 (!) 83/38 Standing Left Arm Normal 83  08/09/22 1516 95/53 Sitting Left Arm Normal 71  08/09/22 1505 -- Sitting Left Arm Normal --     Objective:    Physical Exam Vitals and nursing note reviewed.  Constitutional:      General: He is not in acute distress. Neck:     Vascular: No JVD.  Cardiovascular:     Rate and Rhythm: Normal rate and regular rhythm.     Pulses: Normal pulses.     Heart sounds: Murmur heard.     Harsh midsystolic murmur is present with a grade of 2/6 at the upper right sternal border radiating to the neck.  Pulmonary:     Effort: Pulmonary effort is normal.     Breath sounds: Normal breath sounds. No wheezing or rales.  Musculoskeletal:     Right lower leg: No edema.     Left lower leg: No edema.     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 Discontinued metoprolol, Jardiance, losartan. Increase hydration.  Prosthetic valve endocarditis: Vegetation noted on posterior aortic valve, in the setting of Strept sanguinis bacteremia  Currently on IV Rocephin for 6 weeks. Cardiothoracic surgery consulted and hospitalization, no addiction for surgery. Will repeat outpatient echocardiogram in a few weeks.  HFrEF: EF 35-40% with acute heart failure decompensation in 07/2022. Patient appears euvolemic at this time. Metoprolol, Jardiance, losartan discontinued due to hypotension. I will see him back in 2-3 weeks to reassess if any of the HFrEF medications could be reintroduced.  Hyponatremia: Resolved on hospital discharge.  Will check BMP in 2 weeks.  CAD: S/p CABG. no anginal symptoms. Continue aspirin, statin.  F/u in 3 weeks    Elder Negus, MD Pager: [phone] Office: [phone]

## 2022-08-12 DIAGNOSIS — T826XXA Infection and inflammatory reaction due to cardiac valve prosthesis, initial encounter: Secondary | ICD-10-CM | POA: Diagnosis not present

## 2022-08-12 DIAGNOSIS — R7881 Bacteremia: Secondary | ICD-10-CM | POA: Diagnosis not present

## 2022-08-12 DIAGNOSIS — J189 Pneumonia, unspecified organism: Secondary | ICD-10-CM | POA: Diagnosis not present

## 2022-08-12 DIAGNOSIS — B955 Unspecified streptococcus as the cause of diseases classified elsewhere: Secondary | ICD-10-CM | POA: Diagnosis not present

## 2022-08-12 DIAGNOSIS — I5021 Acute systolic (congestive) heart failure: Secondary | ICD-10-CM | POA: Diagnosis not present

## 2022-08-12 DIAGNOSIS — J9601 Acute respiratory failure with hypoxia: Secondary | ICD-10-CM | POA: Diagnosis not present

## 2022-08-12 DIAGNOSIS — I110 Hypertensive heart disease with heart failure: Secondary | ICD-10-CM | POA: Diagnosis not present

## 2022-08-12 DIAGNOSIS — I5033 Acute on chronic diastolic (congestive) heart failure: Secondary | ICD-10-CM | POA: Diagnosis not present

## 2022-08-12 DIAGNOSIS — S51801D Unspecified open wound of right forearm, subsequent encounter: Secondary | ICD-10-CM | POA: Diagnosis not present

## 2022-08-13 DIAGNOSIS — T826XXA Infection and inflammatory reaction due to cardiac valve prosthesis, initial encounter: Secondary | ICD-10-CM | POA: Diagnosis not present

## 2022-08-15 DIAGNOSIS — I110 Hypertensive heart disease with heart failure: Secondary | ICD-10-CM | POA: Diagnosis not present

## 2022-08-15 DIAGNOSIS — S51801D Unspecified open wound of right forearm, subsequent encounter: Secondary | ICD-10-CM | POA: Diagnosis not present

## 2022-08-15 DIAGNOSIS — J189 Pneumonia, unspecified organism: Secondary | ICD-10-CM | POA: Diagnosis not present

## 2022-08-15 DIAGNOSIS — B955 Unspecified streptococcus as the cause of diseases classified elsewhere: Secondary | ICD-10-CM | POA: Diagnosis not present

## 2022-08-15 DIAGNOSIS — J9601 Acute respiratory failure with hypoxia: Secondary | ICD-10-CM | POA: Diagnosis not present

## 2022-08-15 DIAGNOSIS — R7881 Bacteremia: Secondary | ICD-10-CM | POA: Diagnosis not present

## 2022-08-15 DIAGNOSIS — Z792 Long term (current) use of antibiotics: Secondary | ICD-10-CM | POA: Diagnosis not present

## 2022-08-15 DIAGNOSIS — I5033 Acute on chronic diastolic (congestive) heart failure: Secondary | ICD-10-CM | POA: Diagnosis not present

## 2022-08-15 DIAGNOSIS — T826XXA Infection and inflammatory reaction due to cardiac valve prosthesis, initial encounter: Secondary | ICD-10-CM | POA: Diagnosis not present

## 2022-08-15 DIAGNOSIS — Z5181 Encounter for therapeutic drug level monitoring: Secondary | ICD-10-CM | POA: Diagnosis not present

## 2022-08-15 DIAGNOSIS — I5021 Acute systolic (congestive) heart failure: Secondary | ICD-10-CM | POA: Diagnosis not present

## 2022-08-17 DIAGNOSIS — I5033 Acute on chronic diastolic (congestive) heart failure: Secondary | ICD-10-CM | POA: Diagnosis not present

## 2022-08-17 DIAGNOSIS — I5021 Acute systolic (congestive) heart failure: Secondary | ICD-10-CM | POA: Diagnosis not present

## 2022-08-17 DIAGNOSIS — R7881 Bacteremia: Secondary | ICD-10-CM | POA: Diagnosis not present

## 2022-08-17 DIAGNOSIS — J189 Pneumonia, unspecified organism: Secondary | ICD-10-CM | POA: Diagnosis not present

## 2022-08-17 DIAGNOSIS — I110 Hypertensive heart disease with heart failure: Secondary | ICD-10-CM | POA: Diagnosis not present

## 2022-08-17 DIAGNOSIS — T826XXA Infection and inflammatory reaction due to cardiac valve prosthesis, initial encounter: Secondary | ICD-10-CM | POA: Diagnosis not present

## 2022-08-17 DIAGNOSIS — J9601 Acute respiratory failure with hypoxia: Secondary | ICD-10-CM | POA: Diagnosis not present

## 2022-08-17 DIAGNOSIS — B955 Unspecified streptococcus as the cause of diseases classified elsewhere: Secondary | ICD-10-CM | POA: Diagnosis not present

## 2022-08-17 DIAGNOSIS — S51801D Unspecified open wound of right forearm, subsequent encounter: Secondary | ICD-10-CM | POA: Diagnosis not present

## 2022-08-19 DIAGNOSIS — I5033 Acute on chronic diastolic (congestive) heart failure: Secondary | ICD-10-CM | POA: Diagnosis not present

## 2022-08-19 DIAGNOSIS — S51801D Unspecified open wound of right forearm, subsequent encounter: Secondary | ICD-10-CM | POA: Diagnosis not present

## 2022-08-19 DIAGNOSIS — R7881 Bacteremia: Secondary | ICD-10-CM | POA: Diagnosis not present

## 2022-08-19 DIAGNOSIS — B955 Unspecified streptococcus as the cause of diseases classified elsewhere: Secondary | ICD-10-CM | POA: Diagnosis not present

## 2022-08-19 DIAGNOSIS — J189 Pneumonia, unspecified organism: Secondary | ICD-10-CM | POA: Diagnosis not present

## 2022-08-19 DIAGNOSIS — T826XXA Infection and inflammatory reaction due to cardiac valve prosthesis, initial encounter: Secondary | ICD-10-CM | POA: Diagnosis not present

## 2022-08-19 DIAGNOSIS — I5021 Acute systolic (congestive) heart failure: Secondary | ICD-10-CM | POA: Diagnosis not present

## 2022-08-19 DIAGNOSIS — J9601 Acute respiratory failure with hypoxia: Secondary | ICD-10-CM | POA: Diagnosis not present

## 2022-08-19 DIAGNOSIS — I110 Hypertensive heart disease with heart failure: Secondary | ICD-10-CM | POA: Diagnosis not present

## 2022-08-20 DIAGNOSIS — T826XXA Infection and inflammatory reaction due to cardiac valve prosthesis, initial encounter: Secondary | ICD-10-CM | POA: Diagnosis not present

## 2022-08-22 DIAGNOSIS — S51801D Unspecified open wound of right forearm, subsequent encounter: Secondary | ICD-10-CM | POA: Diagnosis not present

## 2022-08-22 DIAGNOSIS — I5033 Acute on chronic diastolic (congestive) heart failure: Secondary | ICD-10-CM | POA: Diagnosis not present

## 2022-08-22 DIAGNOSIS — J189 Pneumonia, unspecified organism: Secondary | ICD-10-CM | POA: Diagnosis not present

## 2022-08-22 DIAGNOSIS — Z5181 Encounter for therapeutic drug level monitoring: Secondary | ICD-10-CM | POA: Diagnosis not present

## 2022-08-22 DIAGNOSIS — T826XXA Infection and inflammatory reaction due to cardiac valve prosthesis, initial encounter: Secondary | ICD-10-CM | POA: Diagnosis not present

## 2022-08-22 DIAGNOSIS — I110 Hypertensive heart disease with heart failure: Secondary | ICD-10-CM | POA: Diagnosis not present

## 2022-08-22 DIAGNOSIS — E871 Hypo-osmolality and hyponatremia: Secondary | ICD-10-CM | POA: Diagnosis not present

## 2022-08-22 DIAGNOSIS — J9601 Acute respiratory failure with hypoxia: Secondary | ICD-10-CM | POA: Diagnosis not present

## 2022-08-22 DIAGNOSIS — R7881 Bacteremia: Secondary | ICD-10-CM | POA: Diagnosis not present

## 2022-08-22 DIAGNOSIS — B955 Unspecified streptococcus as the cause of diseases classified elsewhere: Secondary | ICD-10-CM | POA: Diagnosis not present

## 2022-08-22 DIAGNOSIS — I5021 Acute systolic (congestive) heart failure: Secondary | ICD-10-CM | POA: Diagnosis not present

## 2022-08-22 DIAGNOSIS — Z792 Long term (current) use of antibiotics: Secondary | ICD-10-CM | POA: Diagnosis not present

## 2022-08-23 ENCOUNTER — Other Ambulatory Visit: Payer: Self-pay | Admitting: Family

## 2022-08-23 ENCOUNTER — Encounter: Payer: Self-pay | Admitting: Gastroenterology

## 2022-08-23 DIAGNOSIS — R7881 Bacteremia: Secondary | ICD-10-CM | POA: Diagnosis not present

## 2022-08-23 DIAGNOSIS — T826XXA Infection and inflammatory reaction due to cardiac valve prosthesis, initial encounter: Secondary | ICD-10-CM | POA: Diagnosis not present

## 2022-08-23 DIAGNOSIS — S51801D Unspecified open wound of right forearm, subsequent encounter: Secondary | ICD-10-CM | POA: Diagnosis not present

## 2022-08-23 DIAGNOSIS — I110 Hypertensive heart disease with heart failure: Secondary | ICD-10-CM | POA: Diagnosis not present

## 2022-08-23 DIAGNOSIS — I5033 Acute on chronic diastolic (congestive) heart failure: Secondary | ICD-10-CM | POA: Diagnosis not present

## 2022-08-23 DIAGNOSIS — B955 Unspecified streptococcus as the cause of diseases classified elsewhere: Secondary | ICD-10-CM | POA: Diagnosis not present

## 2022-08-23 DIAGNOSIS — I5021 Acute systolic (congestive) heart failure: Secondary | ICD-10-CM | POA: Diagnosis not present

## 2022-08-23 DIAGNOSIS — J189 Pneumonia, unspecified organism: Secondary | ICD-10-CM | POA: Diagnosis not present

## 2022-08-23 DIAGNOSIS — J9601 Acute respiratory failure with hypoxia: Secondary | ICD-10-CM | POA: Diagnosis not present

## 2022-08-23 LAB — BASIC METABOLIC PANEL
BUN/Creatinine Ratio: 18 (ref 10–24)
BUN: 16 mg/dL (ref 8–27)
CO2: 24 mmol/L (ref 20–29)
Calcium: 9.5 mg/dL (ref 8.6–10.2)
Chloride: 100 mmol/L (ref 96–106)
Creatinine, Ser: 0.91 mg/dL (ref 0.76–1.27)
Glucose: 96 mg/dL (ref 70–99)
Potassium: 5.5 mmol/L — ABNORMAL HIGH (ref 3.5–5.2)
Sodium: 137 mmol/L (ref 134–144)
eGFR: 85 mL/min/{1.73_m2} (ref >59)

## 2022-08-24 DIAGNOSIS — S51801D Unspecified open wound of right forearm, subsequent encounter: Secondary | ICD-10-CM | POA: Diagnosis not present

## 2022-08-24 DIAGNOSIS — T826XXA Infection and inflammatory reaction due to cardiac valve prosthesis, initial encounter: Secondary | ICD-10-CM | POA: Diagnosis not present

## 2022-08-24 DIAGNOSIS — J9601 Acute respiratory failure with hypoxia: Secondary | ICD-10-CM | POA: Diagnosis not present

## 2022-08-24 DIAGNOSIS — I5021 Acute systolic (congestive) heart failure: Secondary | ICD-10-CM | POA: Diagnosis not present

## 2022-08-24 DIAGNOSIS — I5033 Acute on chronic diastolic (congestive) heart failure: Secondary | ICD-10-CM | POA: Diagnosis not present

## 2022-08-24 DIAGNOSIS — I110 Hypertensive heart disease with heart failure: Secondary | ICD-10-CM | POA: Diagnosis not present

## 2022-08-24 DIAGNOSIS — J189 Pneumonia, unspecified organism: Secondary | ICD-10-CM | POA: Diagnosis not present

## 2022-08-24 DIAGNOSIS — B955 Unspecified streptococcus as the cause of diseases classified elsewhere: Secondary | ICD-10-CM | POA: Diagnosis not present

## 2022-08-24 DIAGNOSIS — R7881 Bacteremia: Secondary | ICD-10-CM | POA: Diagnosis not present

## 2022-08-25 ENCOUNTER — Encounter: Payer: Self-pay | Admitting: Cardiology

## 2022-08-25 ENCOUNTER — Ambulatory Visit: Payer: Medicare PPO | Admitting: Cardiology

## 2022-08-25 VITALS — BP 119/67 | HR 62 | Resp 17 | Ht 71.0 in | Wt 156.0 lb

## 2022-08-25 DIAGNOSIS — I25810 Atherosclerosis of coronary artery bypass graft(s) without angina pectoris: Secondary | ICD-10-CM | POA: Diagnosis not present

## 2022-08-25 DIAGNOSIS — I38 Endocarditis, valve unspecified: Secondary | ICD-10-CM

## 2022-08-25 DIAGNOSIS — I5020 Unspecified systolic (congestive) heart failure: Secondary | ICD-10-CM

## 2022-08-25 DIAGNOSIS — T826XXA Infection and inflammatory reaction due to cardiac valve prosthesis, initial encounter: Secondary | ICD-10-CM | POA: Diagnosis not present

## 2022-08-25 MED ORDER — TORSEMIDE 10 MG PO TABS: 10.0000 mg | ORAL_TABLET | Freq: Two times a day (BID) | ORAL | 3 refills | Status: DC

## 2022-08-25 MED ORDER — METOPROLOL SUCCINATE ER 25 MG PO TB24: 12.5000 mg | ORAL_TABLET | Freq: Every day | ORAL | 3 refills | Status: DC

## 2022-08-25 NOTE — Progress Notes (Signed)
Subjective:   [name], male    DOB: [birth year], 82 y.o.   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  He was diuresed, treated with IV Rocephin, discharged with PICC line with plans for 6 weeks of antibiotics.  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 At subsequent follow up visit, we stopped metoprolol, Jardiance, torsemide, losartan due to hypotension.  Patient is here today with his caregiver. He denies any lightheadedness symptoms. He has noticed leg swelling.  He denies chest pain, shortness of breath.    Current Outpatient Medications:    acetaminophen (TYLENOL) 500 MG tablet, Take 2 tablets (1,000 mg total) by mouth every 6 (six) hours as needed for mild pain or fever., Disp: 30 tablet, Rfl: 0   aspirin EC 81 MG tablet, Take 81 mg by mouth daily. Swallow whole., Disp: , Rfl:    atorvastatin (LIPITOR) 40 MG tablet, Take 1 tablet (40 mg total) by mouth daily., Disp: 30 tablet, Rfl: 0   cefTRIAXone (ROCEPHIN) IVPB, Inject 2 g into the vein daily. Indication:  Endocarditis First Dose: No Last Day of Therapy:  09/10/22 Labs - Once weekly:  CBC/D and BMP, Labs - Once weekly: ESR and CRP Method of administration: IV Push Method of administration may be changed at the discretion of home infusion pharmacist based upon assessment of the patient and/or caregiver's ability to self-administer the medication ordered., Disp: 38 Units, Rfl: 0   Cetirizine HCl (ZYRTEC  ALLERGY) 10 MG CAPS, Take 10 mg by mouth daily as needed (as needed for allergies)., Disp: , Rfl:    [employer] VITAMIN C 500 MG tablet, TAKE 1 TABLET (500 MG TOTAL) BY MOUTH DAILY., Disp: 90 tablet, Rfl: 3   diclofenac Sodium (VOLTAREN) 1 % GEL, Apply 2 g topically 4 (four) times daily. To low back (Patient not taking: Reported on 08/09/2022), Disp: 50 g, Rfl: 0   lactulose (CEPHULAC) 20 g packet, Take 1 packet (20 g total) by mouth 3 (three) times daily as needed (constipation)., Disp: 30 each, Rfl: 0   lidocaine (LIDODERM) 5 %, Place 1 patch onto the skin daily. Remove & Discard patch within 12 hours or as directed by MD (Patient not taking: Reported on 08/09/2022), Disp: 1 patch, Rfl: 0   Multiple Vitamin (MULTIVITAMIN ADULT PO), Take 1 tablet by mouth daily., Disp: , Rfl:    MYRBETRIQ 25 MG TB24 tablet, Take 25 mg by mouth daily., Disp: , Rfl:    Omega-3 Fatty Acids (FISH OIL) 1000 MG CAPS, Take 2 capsules by mouth daily., Disp: , Rfl:    silodosin (RAPAFLO) 8 MG CAPS capsule, Take 8 mg by mouth daily., Disp: , Rfl:    torsemide (DEMADEX) 10 MG tablet, Take 1 tablet (10 mg total) by mouth daily., Disp: 30 tablet, Rfl: 0   Cardiovascular studies:  EKG 08/09/2022: Sinus rhythm 79 bpm with sinus arrhtymia Right bundle branch block  TEE 08/01/2022:  1. Oscillating density on prosthetic aortic valve consistent with  vegetation.   2. Left ventricular  ejection fraction, by estimation, is 45 to 50%. The  left ventricle has mildly decreased function. The left ventricle  demonstrates global hypokinesis.   3. Right ventricular systolic function is normal. The right ventricular  size is normal.   4. LAA known to be ligated. Left atrial size was severely dilated.   5. Right atrial size was mildly dilated.   6. The mitral valve is normal in structure. Moderate mitral valve  regurgitation.   7. The aortic valve has been repaired/replaced. Aortic valve  regurgitation is not visualized. Moderate aortic valve  stenosis. There is  a 23 mm Edwards inspiris resilia pericardial valve present in the aortic  position.   8. There is mild (Grade II) plaque involving the descending aorta.   Mobile cardiac telemetry 13 days 03/01/2021 - 03/15/2021: Dominant rhythm: Sinus. HR 42-125 bpm. Avg HR 71 bpm, in sinus rhythm. 15 episodes of SVT/probable AT, fastest at 148 bpm for 6 beats, longest for 19 beats at 96 bpm. 7.4% isolated SVE, <1% couplet/triplets. 4 episodes of VT, fastest at 144 bpm for 4 bpm, longest for 12 beats at 102 bpm. Some episodes of VT may be SVT with possible aberrancy. 8.3% isolated VE, <1% couplet/triplets. 1 episode of sinus pause 3.1 sec at 12:11 AM w/no reported symptoms.  No atrial fibrillation/atrial flutter/VT/high grade AV block, noted. 1 patient triggered event, correlated w/VE.  Echocardiogram 03/16/2021: Left ventricle cavity is normal in size and wall thickness. Abnormal septal wall motion due to post-operative valve. Normal LV systolic function with visual EF 50-55%. Doppler evidence of grade II (pseudonormal) diastolic dysfunction, elevated LAP.  Left atrial cavity is moderately dilated. Well seated bioprosthetic aortic valve. Mildly elevated mean PG 13 mmHg. No aortic valve regurgitation. Mild mitral valve leaflet thickening. Moderate (Grade II) mitral regurgitation. Mild tricuspid regurgitation.  Mild pulmonic regurgitation. No evidence of pulmonary hypertension. No significant change compared to previous study on 02/21/2020.  Cardiac surgery 07/24/2017: CABG (LIMA-LAD, SVG- dLCx, SVG-RCA) Aortic valve replacement 23 mm Edwards Inspiris Resilia pericardial valve LAA clipping  Vascular ultrasound 07/11/2017: Bilateral carotid mild stenosis 1-39% Normal bilateral ABI  Recent labs:  Latest Reference Range & Units 08/03/22 04:21 08/22/22 10:06  Sodium 134 - 144 mmol/L 136 137  Potassium 3.5 - 5.2 mmol/L 4.5 5.5 (H)  Chloride 96 - 106 mmol/L 102 100  CO2 20 - 29 mmol/L  27 24  Glucose 70 - 99 mg/dL 440 (H) 96  BUN 8 - 27 mg/dL 15 16  Creatinine 1.02 - 1.27 mg/dL 7.25 3.66  Calcium 8.6 - 10.2 mg/dL 8.8 (L) 9.5  Anion gap 5 - 15  7   BUN/Creatinine Ratio 10 - 24   18  eGFR >59 mL/min/1.73  85  (H): Data is abnormally high (L): Data is abnormally low    Review of Systems  Constitutional: Negative for malaise/fatigue.  Cardiovascular:  Positive for leg swelling. Negative for chest pain, dyspnea on exertion, palpitations and syncope.  Neurological:  Positive for light-headedness.         Today's Vitals   08/25/22 0916  BP: 119/67  Pulse: 62  Resp: 17  SpO2: 100%  Weight: 156 lb (70.8 kg)  Height: 5\' 11"  (1.803 m)      Objective:    Physical Exam Vitals and nursing note reviewed.  Constitutional:      General: He is not in acute distress. Neck:     Vascular: No JVD.  Cardiovascular:     Rate and Rhythm: Normal rate and regular  rhythm.     Pulses: Normal pulses.     Heart sounds: Murmur heard.     Harsh midsystolic murmur is present with a grade of 2/6 at the upper right sternal border radiating to the neck.  Pulmonary:     Effort: Pulmonary effort is normal.     Breath sounds: Normal breath sounds. No wheezing or rales.  Musculoskeletal:     Right lower leg: Edema (1+) present.     Left lower leg: Edema (1+) present.     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 Blood pressure normal today.  Prosthetic valve endocarditis: Vegetation noted on posterior aortic valve, in the setting of Strept sanguinis bacteremia  Currently on IV Rocephin for 6  weeks. Cardiothoracic surgery consulted and hospitalization, no addiction for surgery. Will repeat limited echocardiogram to reassess EF and aortic valve vegetation in 2-3 weeks.  HFrEF: EF 35-40% with acute heart failure decompensation in 07/2022. Patient appears euvolemic at this time. Stopped metoprolol, Jardiance, losartan due to hypotension (07/2022). Resume metoprolol succinate at 12.5 mg daily along with torsemide 10 mg daily. Hopefully, this will help to reduce potasium as well. Unable to add most of the other GDMT for HFrEF due to hyperkalemia, at this time.   Hyponatremia: Resolved.  CAD: S/p CABG. no anginal symptoms. Continue aspirin, statin.  F/u in 4 weeks    Elder Negus, MD Pager: [phone] Office: [phone]

## 2022-08-26 DIAGNOSIS — B955 Unspecified streptococcus as the cause of diseases classified elsewhere: Secondary | ICD-10-CM | POA: Diagnosis not present

## 2022-08-26 DIAGNOSIS — J189 Pneumonia, unspecified organism: Secondary | ICD-10-CM | POA: Diagnosis not present

## 2022-08-26 DIAGNOSIS — J9601 Acute respiratory failure with hypoxia: Secondary | ICD-10-CM | POA: Diagnosis not present

## 2022-08-26 DIAGNOSIS — I5021 Acute systolic (congestive) heart failure: Secondary | ICD-10-CM | POA: Diagnosis not present

## 2022-08-26 DIAGNOSIS — I5033 Acute on chronic diastolic (congestive) heart failure: Secondary | ICD-10-CM | POA: Diagnosis not present

## 2022-08-26 DIAGNOSIS — S51801D Unspecified open wound of right forearm, subsequent encounter: Secondary | ICD-10-CM | POA: Diagnosis not present

## 2022-08-26 DIAGNOSIS — I110 Hypertensive heart disease with heart failure: Secondary | ICD-10-CM | POA: Diagnosis not present

## 2022-08-26 DIAGNOSIS — R7881 Bacteremia: Secondary | ICD-10-CM | POA: Diagnosis not present

## 2022-08-26 DIAGNOSIS — T826XXA Infection and inflammatory reaction due to cardiac valve prosthesis, initial encounter: Secondary | ICD-10-CM | POA: Diagnosis not present

## 2022-08-27 DIAGNOSIS — T826XXA Infection and inflammatory reaction due to cardiac valve prosthesis, initial encounter: Secondary | ICD-10-CM | POA: Diagnosis not present

## 2022-08-28 DIAGNOSIS — M6281 Muscle weakness (generalized): Secondary | ICD-10-CM | POA: Diagnosis not present

## 2022-08-29 ENCOUNTER — Ambulatory Visit (INDEPENDENT_AMBULATORY_CARE_PROVIDER_SITE_OTHER): Payer: Medicare PPO | Admitting: Family

## 2022-08-29 ENCOUNTER — Encounter: Payer: Self-pay | Admitting: Family

## 2022-08-29 VITALS — BP 110/60 | HR 88 | Temp 98.0°F | Resp 18 | Ht 71.0 in | Wt 155.2 lb

## 2022-08-29 DIAGNOSIS — Z5181 Encounter for therapeutic drug level monitoring: Secondary | ICD-10-CM | POA: Diagnosis not present

## 2022-08-29 DIAGNOSIS — J9601 Acute respiratory failure with hypoxia: Secondary | ICD-10-CM | POA: Diagnosis not present

## 2022-08-29 DIAGNOSIS — B955 Unspecified streptococcus as the cause of diseases classified elsewhere: Secondary | ICD-10-CM | POA: Diagnosis not present

## 2022-08-29 DIAGNOSIS — N400 Enlarged prostate without lower urinary tract symptoms: Secondary | ICD-10-CM | POA: Diagnosis not present

## 2022-08-29 DIAGNOSIS — I5033 Acute on chronic diastolic (congestive) heart failure: Secondary | ICD-10-CM | POA: Diagnosis not present

## 2022-08-29 DIAGNOSIS — E782 Mixed hyperlipidemia: Secondary | ICD-10-CM | POA: Diagnosis not present

## 2022-08-29 DIAGNOSIS — I10 Essential (primary) hypertension: Secondary | ICD-10-CM

## 2022-08-29 DIAGNOSIS — Z0289 Encounter for other administrative examinations: Secondary | ICD-10-CM | POA: Diagnosis not present

## 2022-08-29 DIAGNOSIS — I5021 Acute systolic (congestive) heart failure: Secondary | ICD-10-CM | POA: Diagnosis not present

## 2022-08-29 DIAGNOSIS — R7881 Bacteremia: Secondary | ICD-10-CM | POA: Diagnosis not present

## 2022-08-29 DIAGNOSIS — H6122 Impacted cerumen, left ear: Secondary | ICD-10-CM | POA: Diagnosis not present

## 2022-08-29 DIAGNOSIS — I25810 Atherosclerosis of coronary artery bypass graft(s) without angina pectoris: Secondary | ICD-10-CM

## 2022-08-29 DIAGNOSIS — I110 Hypertensive heart disease with heart failure: Secondary | ICD-10-CM | POA: Diagnosis not present

## 2022-08-29 DIAGNOSIS — Z792 Long term (current) use of antibiotics: Secondary | ICD-10-CM | POA: Diagnosis not present

## 2022-08-29 DIAGNOSIS — R2681 Unsteadiness on feet: Secondary | ICD-10-CM | POA: Diagnosis not present

## 2022-08-29 DIAGNOSIS — S51801D Unspecified open wound of right forearm, subsequent encounter: Secondary | ICD-10-CM | POA: Diagnosis not present

## 2022-08-29 DIAGNOSIS — J189 Pneumonia, unspecified organism: Secondary | ICD-10-CM | POA: Diagnosis not present

## 2022-08-29 DIAGNOSIS — T826XXA Infection and inflammatory reaction due to cardiac valve prosthesis, initial encounter: Secondary | ICD-10-CM | POA: Diagnosis not present

## 2022-08-29 NOTE — Progress Notes (Signed)
Provider: Richarda Blade FNP-C   [name], Jesse Citrin, NP  Patient Care Team: Nawal Burling, Jesse Citrin, NP as PCP - General (Family Medicine) Cardiovascular, [facility]  Extended Emergency Contact Information Primary Emergency Contact: Yoakum,Johnny Address: [address]          [place], [place] [address] Darden Amber of [place] Home Phone: [phone] Mobile Phone: [phone] Relation: Son Secondary Emergency Contact: cole,sue Address: [address]          [place], [place] [address] Darden Amber of [place] Phone: [phone] Relation: Friend  Code Status:  Full Code  Goals of care: Advanced Directive information    07/31/2022   10:11 AM  Advanced Directives  Does Patient Have a Medical Advance Directive? Yes  Type of Estate agent of [place];Living will  Does patient want to make changes to medical advance directive? No - Patient declined  Copy of Healthcare Power of Attorney in Chart? No - copy requested     Chief Complaint  Patient presents with   Acute Visit    Patient is here to get documentation signed for [facility]    HPI:  Pt is a 82 y.o. male seen today to complete [facility] forms for physical assessments.  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  Has been walking with physical therapy currently using cane.  He was also hospitalized on 07/28/2022 to 08/03/2022 after presenting with fever and respiratory distress requiring 8 L high flow nasal cannula.  He was noted to be tachycardic and febrile.  He was admitted for sepsis secondary to multifactorial pneumonia acute on chronic diastolic congestive heart failure and was found to be bacteremic.  Head TEE which revealed aortic valve vegetation.  He was evaluated by cardiothoracic's surgeon no surgery was recommended.  Infectious disease recommended IV antibiotics for 6 weeks.  PICC line was  placed.  Blood cultures showed no growth x 3 days.  He was discharged on IV Rocephin until 09/10/2022 with outpatient follow-up with with PCP infectious disease and cardiology. He denies any acute issues this visit.  Continues with IV antibiotics via right upper arm PICC line.  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    HFrEF (heart failure with reduced ejection fraction) (HCC)    Hypertension    Moderate mitral regurgitation    Severe aortic stenosis    Past Surgical History:  Procedure Laterality Date   AORTIC VALVE REPLACEMENT N/A 07/24/2017   Procedure: AORTIC VALVE REPLACEMENT (AVR) using 23mm Inspiris Aortic Valve;  Surgeon: Alleen Borne, MD;  Location: [facility] OR;  Service: Open Heart Surgery;  Laterality: N/A;   CLIPPING OF ATRIAL APPENDAGE N/A 07/24/2017   Procedure: CLIPPING OF ATRIAL APPENDAGE using a 50mm Atricure clip;  Surgeon: Alleen Borne, MD;  Location: [facility] OR;  Service: Open Heart Surgery;  Laterality: N/A;   CORONARY ARTERY BYPASS GRAFT N/A 07/24/2017   Procedure: CORONARY ARTERY BYPASS GRAFTING (CABG) times 3 using the left greater saphenous vein harvested endoscopically and left internal mammary artery.;  Surgeon: Alleen Borne, MD;  Location: [facility] OR;  Service: Open Heart Surgery;  Laterality: N/A;   EYE SURGERY Bilateral    lens replacements for cataracts  HERNIA REPAIR     INGUINAL HERNIA REPAIR Right 05/06/2015   Procedure: LAPAROSCOPIC RIGHT INGUINAL HERNIA WITH MESH;  Surgeon: Abigail Miyamoto, MD;  Location: [facility] ORS;  Service: General;  Laterality: Right;   INSERTION OF MESH Right 05/06/2015   Procedure: INSERTION OF MESH;  Surgeon: Abigail Miyamoto, MD;  Location: [facility] ORS;  Service: General;  Laterality: Right;   RIGHT/LEFT HEART CATH AND CORONARY ANGIOGRAPHY N/A 07/10/2017   Procedure: RIGHT/LEFT HEART CATH AND CORONARY  ANGIOGRAPHY;  Surgeon: Elder Negus, MD;  Location: [facility] INVASIVE CV LAB;  Service: Cardiovascular;  Laterality: N/A;   TEE WITHOUT CARDIOVERSION N/A 07/11/2017   Procedure: TRANSESOPHAGEAL ECHOCARDIOGRAM (TEE);  Surgeon: Elder Negus, MD;  Location: [facility] ENDOSCOPY;  Service: Cardiovascular;  Laterality: N/A;   TEE WITHOUT CARDIOVERSION N/A 07/24/2017   Procedure: TRANSESOPHAGEAL ECHOCARDIOGRAM (TEE);  Surgeon: Alleen Borne, MD;  Location: [facility] OR;  Service: Open Heart Surgery;  Laterality: N/A;   TEE WITHOUT CARDIOVERSION N/A 08/01/2022   Procedure: TRANSESOPHAGEAL ECHOCARDIOGRAM;  Surgeon: Lewayne Bunting, MD;  Location: [facility] INVASIVE CV LAB;  Service: Cardiovascular;  Laterality: N/A;   TOTAL KNEE ARTHROPLASTY Right 08/07/2019   Procedure: TOTAL KNEE ARTHROPLASTY;  Surgeon: Ollen Gross, MD;  Location: [facility] ORS;  Service: Orthopedics;  Laterality: Right;   ULTRASOUND GUIDANCE FOR VASCULAR ACCESS  07/10/2017   Procedure: Ultrasound Guidance For Vascular Access;  Surgeon: Elder Negus, MD;  Location: [facility] INVASIVE CV LAB;  Service: Cardiovascular;;    Allergies  Allergen Reactions   Sulfamethoxazole-Trimethoprim Rash    Allergies as of 08/29/2022       Reactions   Sulfamethoxazole-trimethoprim Rash        Medication List        Accurate as of August 29, 2022  3:30 PM. If you have any questions, ask your nurse or doctor.          acetaminophen 500 MG tablet Commonly known as: TYLENOL Take 2 tablets (1,000 mg total) by mouth every 6 (six) hours as needed for mild pain or fever.   aspirin EC 81 MG tablet Take 81 mg by mouth daily. Swallow whole.   atorvastatin 40 MG tablet Commonly known as: LIPITOR Take 1 tablet (40 mg total) by mouth daily.   cefTRIAXone IVPB Commonly known as: ROCEPHIN Inject 2 g into the vein daily. Indication:  Endocarditis First Dose: No Last Day of Therapy:  09/10/22 Labs - Once weekly:  CBC/D and BMP, Labs - Once weekly: ESR and  CRP Method of administration: IV Push Method of administration may be changed at the discretion of home infusion pharmacist based upon assessment of the patient and/or caregiver's ability to self-administer the medication ordered.   [employer] Vitamin C 500 MG tablet Generic drug: ascorbic acid TAKE 1 TABLET (500 MG TOTAL) BY MOUTH DAILY.   diclofenac Sodium 1 % Gel Commonly known as: VOLTAREN Apply 2 g topically 4 (four) times daily. To low back   Fish Oil 1000 MG Caps Take 2 capsules by mouth daily.   metoprolol succinate 25 MG 24 hr tablet Commonly known as: TOPROL-XL Take 0.5 tablets (12.5 mg total) by mouth daily. Take with or immediately following a meal.   MULTIVITAMIN ADULT PO Take 1 tablet by mouth daily.   Myrbetriq 25 MG Tb24 tablet Generic drug: mirabegron ER Take 25 mg by mouth daily.   silodosin 8 MG Caps capsule Commonly known as: RAPAFLO Take 8 mg by mouth daily.   torsemide 10 MG tablet Commonly known as: DEMADEX  Take 1 tablet (10 mg total) by mouth 2 (two) times daily.   ZyrTEC Allergy 10 MG Caps Generic drug: Cetirizine HCl Take 10 mg by mouth daily as needed (as needed for allergies).        Review of Systems  Constitutional:  Negative for appetite change, chills, fatigue, fever and unexpected weight change.  HENT:  Negative for congestion, ear discharge, ear pain, facial swelling, hearing loss, nosebleeds, postnasal drip, rhinorrhea, sinus pressure, sinus pain, sneezing, sore throat and trouble swallowing.   Eyes:  Negative for pain, discharge, redness, itching and visual disturbance.  Respiratory:  Negative for cough, chest tightness, shortness of breath and wheezing.   Cardiovascular:  Negative for chest pain, palpitations and leg swelling.  Gastrointestinal:  Negative for abdominal distention, abdominal pain, blood in stool, constipation, diarrhea, nausea and vomiting.  Endocrine: Negative for cold intolerance, heat intolerance, polydipsia, polyphagia  and polyuria.  Genitourinary:  Negative for difficulty urinating, dysuria, flank pain, frequency and urgency.  Musculoskeletal:  Positive for arthralgias, back pain and gait problem. Negative for joint swelling, myalgias, neck pain and neck stiffness.  Skin:  Negative for color change, pallor, rash and wound.  Neurological:  Negative for dizziness, syncope, weakness, light-headedness, numbness and headaches.  Hematological:  Does not bruise/bleed easily.  Psychiatric/Behavioral:  Negative for agitation, behavioral problems, confusion, hallucinations and sleep disturbance. The patient is not nervous/anxious.     Immunization History  Administered Date(s) Administered   Fluad Quad(high Dose 65+) 01/18/2022   Influenza, High Dose Seasonal PF 11/24/2018   Influenza-Unspecified 11/24/2018   Moderna Sars-Covid-2 Vaccination 03/15/2019, 03/15/2019, 04/12/2019, 04/12/2019   PNEUMOCOCCAL CONJUGATE-20 01/18/2022   Tdap 02/11/2013, 01/18/2019   Zoster Recombinant(Shingrix) 02/26/2019, 05/20/2019   Pertinent  Health Maintenance Due  Topic Date Due   INFLUENZA VACCINE  09/08/2022      12/01/2021   11:34 AM 01/18/2022   10:48 AM 03/23/2022    9:48 AM 06/03/2022   10:07 AM 07/19/2022    3:40 PM  Fall Risk  Falls in the past year?  0 0 0 0  Was there an injury with Fall?  0 0 0 0  Fall Risk Category Calculator  0 0 0 0  Fall Risk Category (Retired)  Low     (RETIRED) Patient Fall Risk Level Low fall risk Low fall risk     Patient at Risk for Falls Due to  No Fall Risks No Fall Risks History of fall(s);No Fall Risks History of fall(s);No Fall Risks  Fall risk Follow up  Falls evaluation completed Falls evaluation completed  Falls evaluation completed   Functional Status Survey:    Vitals:   08/29/22 1448  BP: 110/60  Pulse: 88  Resp: 18  Temp: 98 F (36.7 C)  SpO2: 99%  Weight: 155 lb 3.2 oz (70.4 kg)  Height: 5\' 11"  (1.803 m)   Body mass index is 21.65 kg/m. Physical Exam Vitals  reviewed.  Constitutional:      General: He is not in acute distress.    Appearance: Normal appearance. He is normal weight. He is not ill-appearing or diaphoretic.  HENT:     Head: Normocephalic.     Right Ear: Tympanic membrane, ear canal and external ear normal. There is no impacted cerumen.     Left Ear: Tympanic membrane, ear canal and external ear normal. There is no impacted cerumen.     Nose: Nose normal. No congestion or rhinorrhea.     Mouth/Throat:     Mouth: Mucous membranes are  moist.     Pharynx: Oropharynx is clear. No oropharyngeal exudate or posterior oropharyngeal erythema.  Eyes:     General: No scleral icterus.       Right eye: No discharge.        Left eye: No discharge.     Extraocular Movements: Extraocular movements intact.     Conjunctiva/sclera: Conjunctivae normal.     Pupils: Pupils are equal, round, and reactive to light.  Neck:     Vascular: No carotid bruit.  Cardiovascular:     Rate and Rhythm: Normal rate and regular rhythm.     Pulses: Normal pulses.     Heart sounds: Normal heart sounds. No murmur heard.    No friction rub. No gallop.     Comments: Right upper arm PICC line site dry,clean and intact  Pulmonary:     Effort: Pulmonary effort is normal. No respiratory distress.     Breath sounds: Normal breath sounds. No wheezing, rhonchi or rales.  Chest:     Chest wall: No tenderness.  Abdominal:     General: Bowel sounds are normal. There is no distension.     Palpations: Abdomen is soft. There is no mass.     Tenderness: There is no abdominal tenderness. There is no right CVA tenderness, left CVA tenderness, guarding or rebound.  Musculoskeletal:        General: No swelling or tenderness. Normal range of motion.     Cervical back: Normal range of motion. No rigidity or tenderness.     Right lower leg: No edema.     Left lower leg: No edema.  Lymphadenopathy:     Cervical: No cervical adenopathy.  Skin:    General: Skin is warm and dry.      Coloration: Skin is not pale.     Findings: No bruising, erythema, lesion or rash.  Neurological:     Mental Status: He is alert and oriented to person, place, and time.     Cranial Nerves: No cranial nerve deficit.     Sensory: No sensory deficit.     Motor: No weakness.     Coordination: Coordination normal.     Gait: Gait abnormal.  Psychiatric:        Mood and Affect: Mood normal.        Speech: Speech normal.        Behavior: Behavior normal.        Thought Content: Thought content normal.        Judgment: Judgment normal.     Labs reviewed: Recent Labs    06/28/22 0529 07/19/22 1656 07/28/22 0209 07/30/22 0915 08/02/22 0931 08/03/22 0421 08/22/22 1006  NA 132* 133*   < > 131* 133* 136 137  K 4.4 5.1   < > 3.8 4.5 4.5 5.5*  CL 96* 97*   < > 94* 99 102 100  CO2 27 28   < > 28 27 27 24   GLUCOSE 110* 114   < > 113* 125* 106* 96  BUN 17 24   < > 27* 13 15 16   CREATININE 0.83 1.04   < > 1.01 0.84 0.86 0.91  CALCIUM 8.8* 8.7   < > 8.7* 8.5* 8.8* 9.5  MG 1.9 2.0  --  1.9  --   --   --   PHOS  --   --   --  2.9  --   --   --    < > = values in this  interval not displayed.   Recent Labs    06/23/22 2324 06/28/22 0529 07/19/22 1656 07/28/22 0209  AST 19 18 17 28   ALT 21 24 22  32  ALKPHOS 50 47  --  64  BILITOT 1.3* 0.7 0.5 1.0  PROT 6.1* 5.6* 5.9* 5.9*  ALBUMIN 3.0* 2.6*  --  2.6*   Recent Labs    06/28/22 0529 07/19/22 1656 07/28/22 0209 07/29/22 0313 08/02/22 0931  WBC 8.2 7.7 10.5 12.0* 7.6  NEUTROABS 6.9 6,530 9.5*  --   --   HGB 11.1* 9.3* 9.1* 8.6* 8.5*  HCT 32.9* 28.2* 27.6* 26.2* 27.2*  MCV 92.4 92.5 93.6 92.9 97.1  PLT 258 214 121* 124* 141*   Lab Results  Component Value Date   TSH 2.416 06/27/2022   Lab Results  Component Value Date   HGBA1C 5.7 (H) 07/20/2017   Lab Results  Component Value Date   CHOL 212 (H) 08/03/2022   HDL 33 (L) 08/03/2022   LDLCALC 161 (H) 08/03/2022   LDLDIRECT 155 (H) 08/03/2022   TRIG 90 08/03/2022    CHOLHDL 6.4 08/03/2022    Significant Diagnostic Results in last 30 days:  [place] EKG SITE RITE  Result Date: 08/02/2022 If Site Rite image not attached, placement could not be confirmed due to current cardiac rhythm.  ECHO TEE  Result Date: 08/01/2022    TRANSESOPHOGEAL ECHO REPORT   Patient Name:   EMERIL STILLE Date of Exam: 08/01/2022 Medical Rec #:  [id number]      Height:       71.0 in Accession #:    [id number]     Weight:       162.3 lb Date of Birth:  17-Jan-1941      BSA:          1.929 m Patient Age:    81 years       BP:           127/70 mmHg Patient Gender: M              HR:           62 bpm. Exam Location:  Inpatient Procedure: Transesophageal Echo, 3D Echo, Color Doppler and Cardiac Doppler Indications:     Bacteremia  History:         Patient has prior history of Echocardiogram examinations, most                  recent 07/28/2022. CHF, CAD, Prior CABG and LAA clipping; Risk                  Factors:Hypertension and Dyslipidemia. 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                  Aortic Valve: 23 mm Edwards inspiris resilia pericardial valve                  is present in the aortic position.  Sonographer:     Irving Burton Senior RDCS Referring Phys:  [id number] Perlie Gold Diagnosing Phys: Olga Millers MD PROCEDURE: After discussion of the risks and benefits of a TEE, an informed consent was obtained from the patient. The transesophogeal probe was passed without difficulty through the esophogus of the patient. Sedation performed by different physician. The patient was monitored while under deep sedation. Anesthestetic sedation was provided intravenously by Anesthesiology: 210mg  of Propofol. The patient  developed no complications during the procedure.  IMPRESSIONS  1. Oscillating density on prosthetic aortic valve consistent with vegetation.  2. Left ventricular ejection fraction, by estimation, is 45 to 50%. The left ventricle has mildly decreased function. The left  ventricle demonstrates global hypokinesis.  3. Right ventricular systolic function is normal. The right ventricular size is normal.  4. LAA known to be ligated. Left atrial size was severely dilated.  5. Right atrial size was mildly dilated.  6. The mitral valve is normal in structure. Moderate mitral valve regurgitation.  7. The aortic valve has been repaired/replaced. Aortic valve regurgitation is not visualized. Moderate aortic valve stenosis. There is a 23 mm Edwards inspiris resilia pericardial valve present in the aortic position.  8. There is mild (Grade II) plaque involving the descending aorta. FINDINGS  Left Ventricle: Left ventricular ejection fraction, by estimation, is 45 to 50%. The left ventricle has mildly decreased function. The left ventricle demonstrates global hypokinesis. The left ventricular internal cavity size was normal in size. Right Ventricle: The right ventricular size is normal. Right ventricular systolic function is normal. Left Atrium: LAA known to be ligated. Left atrial size was severely dilated. Right Atrium: Right atrial size was mildly dilated. Pericardium: There is no evidence of pericardial effusion. Mitral Valve: The mitral valve is normal in structure. Moderate mitral valve regurgitation. Tricuspid Valve: The tricuspid valve is normal in structure. Tricuspid valve regurgitation is mild. Aortic Valve: The aortic valve has been repaired/replaced. Aortic valve regurgitation is not visualized. Moderate aortic stenosis is present. Aortic valve mean gradient measures 30.5 mmHg. Aortic valve peak gradient measures 43.7 mmHg. Aortic valve area,  by VTI measures 1.19 cm. There is a 23 mm Edwards inspiris resilia pericardial valve present in the aortic position. Pulmonic Valve: The pulmonic valve was not well visualized. Pulmonic valve regurgitation is trivial. Aorta: The aortic root is normal in size and structure. There is mild (Grade II) plaque involving the descending aorta.  IAS/Shunts: No atrial level shunt detected by color flow Doppler. Additional Comments: Oscillating density on prosthetic aortic valve consistent with vegetation. Spectral Doppler performed. LEFT VENTRICLE PLAX 2D LVIDd:         5.50 cm LVIDs:         4.20 cm LV PW:         0.50 cm LVOT diam:     2.20 cm LV SV:         97 LV SV Index:   50 LVOT Area:     3.80 cm  AORTIC VALVE AV Area (Vmax):    1.16 cm AV Area (Vmean):   1.06 cm AV Area (VTI):     1.19 cm AV Vmax:           330.50 cm/s AV Vmean:          267.500 cm/s AV VTI:            0.814 m AV Peak Grad:      43.7 mmHg AV Mean Grad:      30.5 mmHg LVOT Vmax:         101.00 cm/s LVOT Vmean:        74.600 cm/s LVOT VTI:          0.254 m LVOT/AV VTI ratio: 0.31 MR Peak grad:    136.4 mmHg MR Mean grad:    90.0 mmHg    SHUNTS MR Vmax:         584.00 cm/s  Systemic VTI:  0.25 m MR  Vmean:        454.0 cm/s   Systemic Diam: 2.20 cm MR PISA:         3.08 cm MR PISA Eff ROA: 19 mm MR PISA Radius:  0.70 cm Olga Millers MD Electronically signed by Olga Millers MD Signature Date/Time: 08/01/2022/2:32:22 PM    Final    EP STUDY  Result Date: 08/01/2022 See surgical note for result.   Assessment/Plan 1. Essential hypertension Blood pressure well-controlled -Continue on Metroprolol and torsemide  2. Mixed hyperlipidemia Previous LDL not at goal -Continue on atorvastatin -Continue with dietary modification and exercise as tolerated  3. Coronary artery disease involving coronary bypass graft of native heart without angina pectoris Chest pain-free -Continue to control high risk factors  4. Unsteady gait -Ambulates with a walker for long distance and a cane for short distances.  Requires assistance with activities of daily living due to recent hospitalization -Continue with home health physical therapy for gait stability,ROM,exercise  and muscle strengthening  5. Benign prostatic hyperplasia without lower urinary tract symptoms Continue Rapaflo and  Myrbetriq  6. Impacted cerumen of left ear Left ear cerumen lavaged with warm water and hydrogen peroxide moderate amounts of cerumen obtained.Tolerated procedure well.TM clear without any signs of infection.  7.Form completion  [facility] forms completed with patient present.Original copy given to patient and copy sent for scanning.   Family/ staff Communication: Reviewed plan of care with patient verbalized understanding  Labs/tests ordered: None   Next Appointment : Return if symptoms worsen or fail to improve.   Caesar Bookman, NP

## 2022-08-30 ENCOUNTER — Ambulatory Visit (INDEPENDENT_AMBULATORY_CARE_PROVIDER_SITE_OTHER): Payer: Medicare PPO | Admitting: Infectious Diseases

## 2022-08-30 ENCOUNTER — Encounter: Payer: Self-pay | Admitting: Infectious Diseases

## 2022-08-30 ENCOUNTER — Other Ambulatory Visit: Payer: Self-pay

## 2022-08-30 ENCOUNTER — Telehealth: Payer: Self-pay

## 2022-08-30 VITALS — BP 117/67 | HR 71 | Resp 16 | Ht 71.0 in | Wt 153.0 lb

## 2022-08-30 DIAGNOSIS — T826XXA Infection and inflammatory reaction due to cardiac valve prosthesis, initial encounter: Secondary | ICD-10-CM | POA: Diagnosis not present

## 2022-08-30 DIAGNOSIS — Z452 Encounter for adjustment and management of vascular access device: Secondary | ICD-10-CM

## 2022-08-30 DIAGNOSIS — T826XXD Infection and inflammatory reaction due to cardiac valve prosthesis, subsequent encounter: Secondary | ICD-10-CM

## 2022-08-30 DIAGNOSIS — Z79899 Other long term (current) drug therapy: Secondary | ICD-10-CM

## 2022-08-30 DIAGNOSIS — I330 Acute and subacute infective endocarditis: Secondary | ICD-10-CM

## 2022-08-30 MED ORDER — AMOXICILLIN 500 MG PO TABS: 1000.0000 mg | ORAL_TABLET | Freq: Three times a day (TID) | ORAL | 3 refills | Status: DC

## 2022-08-30 NOTE — Progress Notes (Signed)
Patient Active Problem List   Diagnosis Date Noted   Hypotension due to hypovolemia 08/09/2022   Pure hypercholesterolemia 08/03/2022   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   Encounter for assessment of peripherally inserted central catheter (PICC) 08/02/2022   Aortic valve endocarditis 08/01/2022   Cardiomyopathy (HCC) 08/01/2022   Chronic HFrEF (heart failure with reduced ejection fraction) (HCC) 08/01/2022   Prosthetic cardiac valve vegetation 08/01/2022   Anemia 08/01/2022   Multifocal pneumonia 08/01/2022   Acute systolic CHF (congestive heart failure) (HCC) 07/30/2022   Sepsis (HCC) 07/29/2022   Streptococcal bacteremia 07/29/2022   HCAP (healthcare-associated pneumonia) 07/29/2022   Acute on chronic diastolic CHF (congestive heart failure) (HCC) 07/29/2022   Aortic atherosclerosis (HCC) 07/29/2022   Medication management 07/29/2022   Bacteremia 07/29/2022   Acute hypoxic respiratory failure (HCC) 07/28/2022   Hyponatremia 06/24/2022   Low back pain 06/24/2022   Constipation 06/24/2022   Renal cyst, 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   PAC (premature atrial contraction) 03/01/2021   PVC (premature ventricular contraction) 03/01/2021   Atrial bigeminy 02/28/2020   OA (osteoarthritis) of knee 08/07/2019   Primary osteoarthritis of right knee 08/07/2019   Coronary artery disease involving coronary bypass graft of native heart without angina pectoris 09/19/2018   Fatigue 08/01/2018   Mixed hyperlipidemia 06/06/2018   S/P left atrial appendage ligation 07/25/2017   S/P AVR 07/25/2017   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   Coronary artery disease without angina pectoris 07/12/2017   Mitral regurgitation 07/07/2017   (HFpEF) heart failure with preserved ejection fraction (HCC) 07/07/2017   Gout 02/12/2013   Benign hypertension 02/12/2013   GERD (gastroesophageal reflux disease) 02/12/2013    Patient's Medications  New Prescriptions   AMOXICILLIN (AMOXIL) 500 MG TABLET    Take 2  tablets (1,000 mg total) by mouth 3 (three) times daily.  Previous Medications   ACETAMINOPHEN (TYLENOL) 500 MG TABLET    Take 2 tablets (1,000 mg total) by mouth every 6 (six) hours as needed for mild pain or fever.   ASPIRIN EC 81 MG TABLET    Take 81 mg by mouth daily. Swallow whole.   ATORVASTATIN (LIPITOR) 40 MG TABLET    Take 1 tablet (40 mg total) by mouth daily.   CEFTRIAXONE (ROCEPHIN) IVPB    Inject 2 g into the vein daily. Indication:  Endocarditis First Dose: No Last Day of Therapy:  09/10/22 Labs - Once weekly:  CBC/D and BMP, Labs - Once weekly: ESR and CRP Method of administration: IV Push Method of administration may be changed at the discretion of home infusion pharmacist based upon assessment of the patient and/or caregiver's ability to self-administer the medication ordered.   CETIRIZINE HCL (ZYRTEC ALLERGY) 10 MG CAPS    Take 10 mg by mouth daily as needed (as needed for allergies).   [employer] VITAMIN C 500 MG TABLET    TAKE 1 TABLET (500 MG TOTAL) BY MOUTH DAILY.   DICLOFENAC SODIUM (VOLTAREN) 1 % GEL    Apply 2 g topically 4 (four) times daily. To low back   METOPROLOL SUCCINATE (TOPROL-XL) 25 MG 24 HR TABLET    Take 0.5 tablets (12.5 mg total) by mouth daily. Take with or immediately following a meal.   MULTIPLE VITAMIN (MULTIVITAMIN ADULT PO)    Take 1 tablet by mouth daily.   MYRBETRIQ 25 MG TB24 TABLET    Take 25 mg by mouth daily.   OMEGA-3 FATTY ACIDS (FISH OIL) 1000 MG CAPS    Take 2  capsules by mouth daily.   SILODOSIN (RAPAFLO) 8 MG CAPS CAPSULE    Take 8 mg by mouth daily.   TORSEMIDE (DEMADEX) 10 MG TABLET    Take 1 tablet (10 mg total) by mouth 2 (two) times daily.  Modified Medications   No medications on file  Discontinued Medications   No medications on file    Subjective: 82 year old male with multiple co-morbidities including CAD, CHF, moderate MR, severe aortic stenosis s/p CABG, clipping of atrial appendage and prosthetic aortic valve replacement who is  here for HFU for Prosthetic AV endocarditis. Patient discharged on 6/26 to complete 6 weeks of IV ceftriaxone.   He has been following Cardiology since hospital discharge, plan for repeat TTE per cardiology in few weeks.   08/30/22 Getting IV abtx without any concerns related to PICC or abtx. Discussed plan to continue IV abtx until 8/3 to be followed by PO amoxicillin indefinitely. He has no complaints otherwise.   Review of Systems: Denies fevers, chills Denies nausea, vomiting and diarrhea   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    HFrEF (heart failure with reduced ejection fraction) (HCC)    Hypertension    Moderate mitral regurgitation    Severe aortic stenosis    Past Surgical History:  Procedure Laterality Date   AORTIC VALVE REPLACEMENT N/A 07/24/2017   Procedure: AORTIC VALVE REPLACEMENT (AVR) using 23mm Inspiris Aortic Valve;  Surgeon: Alleen Borne, MD;  Location: [facility] OR;  Service: Open Heart Surgery;  Laterality: N/A;   CLIPPING OF ATRIAL APPENDAGE N/A 07/24/2017   Procedure: CLIPPING OF ATRIAL APPENDAGE using a 50mm Atricure clip;  Surgeon: Alleen Borne, MD;  Location: [facility];  Service: Open Heart Surgery;  Laterality: N/A;   CORONARY ARTERY BYPASS GRAFT N/A 07/24/2017   Procedure: CORONARY ARTERY BYPASS GRAFTING (CABG) times 3 using the left greater saphenous vein harvested endoscopically and left internal mammary artery.;  Surgeon: Alleen Borne, MD;  Location: [facility] OR;  Service: Open Heart Surgery;  Laterality: N/A;   EYE SURGERY Bilateral    lens replacements for cataracts   HERNIA REPAIR     INGUINAL HERNIA REPAIR Right 05/06/2015   Procedure: LAPAROSCOPIC RIGHT INGUINAL HERNIA WITH MESH;  Surgeon: Abigail Miyamoto, MD;  Location: [facility] ORS;  Service: General;  Laterality: Right;   INSERTION OF MESH Right 05/06/2015   Procedure:  INSERTION OF MESH;  Surgeon: Abigail Miyamoto, MD;  Location: [facility] ORS;  Service: General;  Laterality: Right;   RIGHT/LEFT HEART CATH AND CORONARY ANGIOGRAPHY N/A 07/10/2017   Procedure: RIGHT/LEFT HEART CATH AND CORONARY ANGIOGRAPHY;  Surgeon: Elder Negus, MD;  Location: [facility] INVASIVE CV LAB;  Service: Cardiovascular;  Laterality: N/A;   TEE WITHOUT CARDIOVERSION N/A 07/11/2017   Procedure: TRANSESOPHAGEAL ECHOCARDIOGRAM (TEE);  Surgeon: Elder Negus, MD;  Location: [facility] ENDOSCOPY;  Service: Cardiovascular;  Laterality: N/A;   TEE WITHOUT CARDIOVERSION N/A 07/24/2017   Procedure: TRANSESOPHAGEAL ECHOCARDIOGRAM (TEE);  Surgeon: Alleen Borne, MD;  Location: [facility] OR;  Service: Open Heart Surgery;  Laterality: N/A;   TEE WITHOUT CARDIOVERSION N/A 08/01/2022   Procedure: TRANSESOPHAGEAL ECHOCARDIOGRAM;  Surgeon: Lewayne Bunting, MD;  Location: [facility] INVASIVE CV LAB;  Service: Cardiovascular;  Laterality: N/A;   TOTAL KNEE ARTHROPLASTY Right 08/07/2019   Procedure: TOTAL KNEE ARTHROPLASTY;  Surgeon: Ollen Gross, MD;  Location: [facility] ORS;  Service: Orthopedics;  Laterality: Right;   ULTRASOUND GUIDANCE FOR VASCULAR ACCESS  07/10/2017   Procedure: Ultrasound Guidance For Vascular Access;  Surgeon: Elder Negus, MD;  Location: [facility] INVASIVE CV LAB;  Service: Cardiovascular;;     Social History   Tobacco Use   Smoking status: Former    Current packs/day: 0.00    Average packs/day: 1 pack/day for 12.0 years (12.0 ttl pk-yrs)    Types: Cigarettes    Start date: 64    Quit date: 32    Years since quitting: 51.5   Smokeless tobacco: Never   Tobacco comments:    quit 30-40 yrs ago  Vaping Use   Vaping status: Never Used  Substance Use Topics   Alcohol use: Yes    Comment: 1-2 drinks   Drug use: Yes    Types: Marijuana    Family History  Problem Relation Age of Onset   Stroke Mother    Heart attack Father     Allergies  Allergen Reactions   Sulfamethoxazole-Trimethoprim  Rash    Health Maintenance  Topic Date Due   COVID-19 Vaccine (5 - 2023-24 season) 10/08/2021   INFLUENZA VACCINE  09/08/2022   Medicare Annual Wellness (AWV)  06/03/2023   DTaP/Tdap/Td (3 - Td or Tdap) 01/17/2029   Pneumonia Vaccine 88+ Years old  Completed   Zoster Vaccines- Shingrix  Completed   HPV VACCINES  Aged Out    Objective:  Vitals:   08/30/22 1009  BP: 117/67  Pulse: 71  Resp: 16  Weight: 153 lb (69.4 kg)  Height: 5\' 11"  (1.803 m)   Body mass index is 21.34 kg/m.  Physical Exam Constitutional:      Appearance: Normal appearance.  HENT:     Head: Normocephalic and atraumatic.      Mouth: Mucous membranes are moist.  Eyes:    Conjunctiva/sclera: Conjunctivae normal.     Pupils:   Cardiovascular:     Rate and Rhythm: Normal rate and regular rhythm.     Heart sounds:   Pulmonary:     Effort: Pulmonary effort is normal.     Breath sounds:   Abdominal:     General: Non distended     Palpations:   Musculoskeletal:        General: Normal range of motion.   Skin:    General: Skin is warm and dry.     Comments: Rt arm PICC with no concerns noted   Neurological:     General: grossly non focal     Mental Status: awake, alert and oriented to person, place, and time.   Psychiatric:        Mood and Affect: Mood normal.   Lab Results Lab Results  Component Value Date   WBC 7.6 08/02/2022   HGB 8.5 (L) 08/02/2022   HCT 27.2 (L) 08/02/2022   MCV 97.1 08/02/2022   PLT 141 (L) 08/02/2022    Lab Results  Component Value Date   CREATININE 0.91 08/22/2022   BUN 16 08/22/2022   NA 137 08/22/2022   K 5.5 (H) 08/22/2022   CL 100 08/22/2022   CO2 24 08/22/2022    Lab Results  Component Value Date   ALT 32 07/28/2022   AST 28 07/28/2022   ALKPHOS 64 07/28/2022   BILITOT 1.0 07/28/2022    Lab Results  Component Value Date   CHOL 212 (H) 08/03/2022   HDL 33 (L) 08/03/2022  LDLCALC 161 (H) 08/03/2022   LDLDIRECT 155 (H) 08/03/2022   TRIG 90  08/03/2022   CHOLHDL 6.4 08/03/2022   No results found for: "LABRPR", "RPRTITER" No results found for: "HIV1RNAQUANT", "HIV1RNAVL", "CD4TABS"  Assessment/Plan 82 year old male with multiple co-morbidities including CAD, CHF, moderate MR, severe aortic stenosis s/p CABG, clipping of atrial appendage and prosthetic aortic valve replacement with    # Strep sanguinis Prosthetic AV endocarditis     TEE 6/24 Oscillating density on prosthetic aortic valve consistent with  vegetation. Moderate AV stenosis. TEE findings has been d/w Dr Laneta Simmers and recommended no surgical intervention    CT Hazy diffuse ground-glass opacities in the lungs bilaterally, greater on the right than on the left, possible edema versus multifocal pneeumonia. Small bilateral pleural effusions with compressive atelectasis.   Plan  Complete 6 weeks of IV ceftriaxone through 8/3 PICC to be removed after last dose on 8/3. Start PO amoxicillin 1 g po tid starting 8/4 indefinitely  Fu in 1 month   # Medication monitoring  7/22 CBC and BMP unremarkable   # CHF # Cardiomyopathy  # CAD s/p CABG # Moderate AV stenosis  - per Cardiology  - plan for repeat TTE noted in few weeks.    I have personally spent 43  minutes involved in face-to-face and non-face-to-face activities for this patient on the day of the visit. Professional time spent includes the following activities: Preparing to see the patient (review of tests), Obtaining and/or reviewing separately obtained history (admission/discharge record), Performing a medically appropriate examination and/or evaluation , Ordering medications/tests/procedures, referring and communicating with other health care professionals, Documenting clinical information in the EMR, Independently interpreting results (not separately reported), Communicating results to the patient/family/caregiver, Counseling and educating the patient/family/caregiver and Care coordination (not separately reported).    Victoriano Lain, MD [facility] 08/30/2022, 10:18 AM

## 2022-08-30 NOTE — Telephone Encounter (Signed)
  Per provider ok to PULL PICC after End Date.   Provider: Dr. Elinor Parkinson  End Date: 09/10/22   Notified [facility] and Julianne Rice

## 2022-08-31 ENCOUNTER — Encounter: Payer: Self-pay | Admitting: Cardiology

## 2022-08-31 DIAGNOSIS — T826XXA Infection and inflammatory reaction due to cardiac valve prosthesis, initial encounter: Secondary | ICD-10-CM | POA: Diagnosis not present

## 2022-08-31 DIAGNOSIS — I5033 Acute on chronic diastolic (congestive) heart failure: Secondary | ICD-10-CM | POA: Diagnosis not present

## 2022-08-31 DIAGNOSIS — J189 Pneumonia, unspecified organism: Secondary | ICD-10-CM | POA: Diagnosis not present

## 2022-08-31 DIAGNOSIS — J9601 Acute respiratory failure with hypoxia: Secondary | ICD-10-CM | POA: Diagnosis not present

## 2022-08-31 DIAGNOSIS — S51801D Unspecified open wound of right forearm, subsequent encounter: Secondary | ICD-10-CM | POA: Diagnosis not present

## 2022-08-31 DIAGNOSIS — I5021 Acute systolic (congestive) heart failure: Secondary | ICD-10-CM | POA: Diagnosis not present

## 2022-08-31 DIAGNOSIS — B955 Unspecified streptococcus as the cause of diseases classified elsewhere: Secondary | ICD-10-CM | POA: Diagnosis not present

## 2022-08-31 DIAGNOSIS — R7881 Bacteremia: Secondary | ICD-10-CM | POA: Diagnosis not present

## 2022-08-31 DIAGNOSIS — I110 Hypertensive heart disease with heart failure: Secondary | ICD-10-CM | POA: Diagnosis not present

## 2022-09-01 ENCOUNTER — Ambulatory Visit: Payer: Medicare PPO | Admitting: Cardiology

## 2022-09-01 ENCOUNTER — Other Ambulatory Visit: Payer: Self-pay | Admitting: Cardiology

## 2022-09-02 DIAGNOSIS — J189 Pneumonia, unspecified organism: Secondary | ICD-10-CM | POA: Diagnosis not present

## 2022-09-02 DIAGNOSIS — B955 Unspecified streptococcus as the cause of diseases classified elsewhere: Secondary | ICD-10-CM | POA: Diagnosis not present

## 2022-09-02 DIAGNOSIS — R7881 Bacteremia: Secondary | ICD-10-CM | POA: Diagnosis not present

## 2022-09-02 DIAGNOSIS — I5033 Acute on chronic diastolic (congestive) heart failure: Secondary | ICD-10-CM | POA: Diagnosis not present

## 2022-09-02 DIAGNOSIS — I110 Hypertensive heart disease with heart failure: Secondary | ICD-10-CM | POA: Diagnosis not present

## 2022-09-02 DIAGNOSIS — S51801D Unspecified open wound of right forearm, subsequent encounter: Secondary | ICD-10-CM | POA: Diagnosis not present

## 2022-09-02 DIAGNOSIS — J9601 Acute respiratory failure with hypoxia: Secondary | ICD-10-CM | POA: Diagnosis not present

## 2022-09-02 DIAGNOSIS — T826XXA Infection and inflammatory reaction due to cardiac valve prosthesis, initial encounter: Secondary | ICD-10-CM | POA: Diagnosis not present

## 2022-09-02 DIAGNOSIS — I5021 Acute systolic (congestive) heart failure: Secondary | ICD-10-CM | POA: Diagnosis not present

## 2022-09-03 DIAGNOSIS — T826XXA Infection and inflammatory reaction due to cardiac valve prosthesis, initial encounter: Secondary | ICD-10-CM | POA: Diagnosis not present

## 2022-09-05 ENCOUNTER — Encounter: Payer: Self-pay | Admitting: Infectious Diseases

## 2022-09-05 DIAGNOSIS — R7881 Bacteremia: Secondary | ICD-10-CM | POA: Diagnosis not present

## 2022-09-05 DIAGNOSIS — S51801D Unspecified open wound of right forearm, subsequent encounter: Secondary | ICD-10-CM | POA: Diagnosis not present

## 2022-09-05 DIAGNOSIS — Z452 Encounter for adjustment and management of vascular access device: Secondary | ICD-10-CM | POA: Diagnosis not present

## 2022-09-05 DIAGNOSIS — T826XXA Infection and inflammatory reaction due to cardiac valve prosthesis, initial encounter: Secondary | ICD-10-CM | POA: Diagnosis not present

## 2022-09-05 DIAGNOSIS — I5033 Acute on chronic diastolic (congestive) heart failure: Secondary | ICD-10-CM | POA: Diagnosis not present

## 2022-09-05 DIAGNOSIS — I5021 Acute systolic (congestive) heart failure: Secondary | ICD-10-CM | POA: Diagnosis not present

## 2022-09-05 DIAGNOSIS — I110 Hypertensive heart disease with heart failure: Secondary | ICD-10-CM | POA: Diagnosis not present

## 2022-09-05 DIAGNOSIS — J9601 Acute respiratory failure with hypoxia: Secondary | ICD-10-CM | POA: Diagnosis not present

## 2022-09-05 DIAGNOSIS — B955 Unspecified streptococcus as the cause of diseases classified elsewhere: Secondary | ICD-10-CM | POA: Diagnosis not present

## 2022-09-05 DIAGNOSIS — J189 Pneumonia, unspecified organism: Secondary | ICD-10-CM | POA: Diagnosis not present

## 2022-09-06 ENCOUNTER — Telehealth: Payer: Self-pay | Admitting: Pharmacist

## 2022-09-06 NOTE — Telephone Encounter (Signed)
[facility] pharmacist Clent Demark contacted pharmacy today regarding patient's recent CBC from 09/05/2022. WBC resulted low at 1.6. Hgb 9.5, RBC 3.16, absolute neutrophils 0, lymphs 0.5, monocytes 1. Most recent WBC was 3.6 on 08/29/2022. Please advise.  Margarite Gouge, PharmD, CPP, BCIDP, AAHIVP Clinical Pharmacist Practitioner Infectious Diseases Clinical Pharmacist [facility]

## 2022-09-06 NOTE — Telephone Encounter (Signed)
Can someone reach out to [name] and explain why he is transitioning? I feel he will have questions. Dr. Elinor Parkinson do you mind sending in the RX?

## 2022-09-06 NOTE — Telephone Encounter (Signed)
Thank you so much team, appreciate it.

## 2022-09-06 NOTE — Telephone Encounter (Signed)
Reviewed plan with [name] over the phone; he had no further questions. Already filled amoxicillin that Dr. Elinor Parkinson sent in last week; he will start taking it tomorrow. [name]

## 2022-09-06 NOTE — Telephone Encounter (Signed)
Informed Amy Christell Constant and Jeri Modena with [facility] to stop ceftriaxone today, check CBC in 2-3 days, then pull PICC.   Triage, can you please contact patient about transitioning to oral antibiotics and send in his prescription per Dr. Elinor Parkinson. Thank you!

## 2022-09-07 DIAGNOSIS — B955 Unspecified streptococcus as the cause of diseases classified elsewhere: Secondary | ICD-10-CM | POA: Diagnosis not present

## 2022-09-07 DIAGNOSIS — R7881 Bacteremia: Secondary | ICD-10-CM | POA: Diagnosis not present

## 2022-09-07 DIAGNOSIS — S51801D Unspecified open wound of right forearm, subsequent encounter: Secondary | ICD-10-CM | POA: Diagnosis not present

## 2022-09-07 DIAGNOSIS — I5033 Acute on chronic diastolic (congestive) heart failure: Secondary | ICD-10-CM | POA: Diagnosis not present

## 2022-09-07 DIAGNOSIS — J9601 Acute respiratory failure with hypoxia: Secondary | ICD-10-CM | POA: Diagnosis not present

## 2022-09-07 DIAGNOSIS — I110 Hypertensive heart disease with heart failure: Secondary | ICD-10-CM | POA: Diagnosis not present

## 2022-09-07 DIAGNOSIS — J189 Pneumonia, unspecified organism: Secondary | ICD-10-CM | POA: Diagnosis not present

## 2022-09-07 DIAGNOSIS — I5021 Acute systolic (congestive) heart failure: Secondary | ICD-10-CM | POA: Diagnosis not present

## 2022-09-07 DIAGNOSIS — T826XXA Infection and inflammatory reaction due to cardiac valve prosthesis, initial encounter: Secondary | ICD-10-CM | POA: Diagnosis not present

## 2022-09-07 NOTE — Telephone Encounter (Signed)
Thanks Amanda

## 2022-09-10 DIAGNOSIS — I5033 Acute on chronic diastolic (congestive) heart failure: Secondary | ICD-10-CM | POA: Diagnosis not present

## 2022-09-10 DIAGNOSIS — I110 Hypertensive heart disease with heart failure: Secondary | ICD-10-CM | POA: Diagnosis not present

## 2022-09-10 DIAGNOSIS — I5021 Acute systolic (congestive) heart failure: Secondary | ICD-10-CM | POA: Diagnosis not present

## 2022-09-10 DIAGNOSIS — R7881 Bacteremia: Secondary | ICD-10-CM | POA: Diagnosis not present

## 2022-09-10 DIAGNOSIS — J189 Pneumonia, unspecified organism: Secondary | ICD-10-CM | POA: Diagnosis not present

## 2022-09-10 DIAGNOSIS — B955 Unspecified streptococcus as the cause of diseases classified elsewhere: Secondary | ICD-10-CM | POA: Diagnosis not present

## 2022-09-10 DIAGNOSIS — S51801D Unspecified open wound of right forearm, subsequent encounter: Secondary | ICD-10-CM | POA: Diagnosis not present

## 2022-09-10 DIAGNOSIS — T826XXA Infection and inflammatory reaction due to cardiac valve prosthesis, initial encounter: Secondary | ICD-10-CM | POA: Diagnosis not present

## 2022-09-10 DIAGNOSIS — J9601 Acute respiratory failure with hypoxia: Secondary | ICD-10-CM | POA: Diagnosis not present

## 2022-09-12 ENCOUNTER — Encounter: Payer: Self-pay | Admitting: Adult Health

## 2022-09-12 ENCOUNTER — Other Ambulatory Visit: Payer: Self-pay

## 2022-09-12 ENCOUNTER — Telehealth (INDEPENDENT_AMBULATORY_CARE_PROVIDER_SITE_OTHER): Payer: Medicare PPO | Admitting: Adult Health

## 2022-09-12 VITALS — BP 124/64 | Ht 71.0 in | Wt 162.0 lb

## 2022-09-12 DIAGNOSIS — K5901 Slow transit constipation: Secondary | ICD-10-CM

## 2022-09-12 MED ORDER — POLYETHYLENE GLYCOL 3350 17 G PO PACK
17.0000 g | PACK | Freq: Every day | ORAL | Status: AC
Start: 2022-09-12 — End: 2022-10-12

## 2022-09-12 NOTE — Progress Notes (Signed)
This service is provided via telemedicine  No vital signs collected/recorded due to the encounter was a telemedicine visit.   Location of patient (ex: home, work):  home  Patient consents to a telephone visit:  yes  Location of the provider (ex: office, home):  Office   Names of all persons participating in the telemedicine service and their role in the encounter:  Brooke(CMA), Patient, Tally Joe  Time spent on call:  7 MINUTES   Virtual Visit via Telephone Note  I connected with [name] name on file. on 09/12/22 at  by telephone and verified that I am speaking with the correct person using two identifiers.  Location: [facility]     I discussed the limitation to a telephone visit.   [name] expressed constipation with no BM in 4 days. He is not having any abd pain or nausea. He is on antibiotics for endocarditis. Did have some loose stools one week ago but this subsided. He has tried docusate at home with no benefit. He is feeling tired and can't concentrate.    Constipation  I suggested he try miralax 17 grams daily mixed with a beverage of his choice.  He can reduce it to every other day or every third day depending upon response.  I recommend he f/u if not improving For his feelings of fatigue if not improving he should make an in person visit.    I discussed the assessment and treatment plan with the patient. The patient was provided an opportunity to ask questions and all were answered. The patient agreed with the plan and demonstrated an understanding of the instructions.   The patient was advised to call back or seek an in-person evaluation if the symptoms worsen or if the condition fails to improve as anticipated.  I provided 7 minutes of non-face-to-face time during this encounter.

## 2022-09-13 ENCOUNTER — Emergency Department (HOSPITAL_COMMUNITY): Payer: Medicare PPO

## 2022-09-13 ENCOUNTER — Encounter (HOSPITAL_COMMUNITY): Payer: Self-pay

## 2022-09-13 ENCOUNTER — Other Ambulatory Visit: Payer: Self-pay

## 2022-09-13 ENCOUNTER — Inpatient Hospital Stay (HOSPITAL_COMMUNITY)
Admission: EM | Admit: 2022-09-13 | Discharge: 2022-09-17 | DRG: 809 | Disposition: A | Discharge status: Home / Self Care | Payer: Medicare PPO | Attending: Internal Medicine | Admitting: Internal Medicine

## 2022-09-13 DIAGNOSIS — Z881 Allergy status to other antibiotic agents status: Secondary | ICD-10-CM | POA: Diagnosis not present

## 2022-09-13 DIAGNOSIS — I700 Atherosclerosis of aorta: Secondary | ICD-10-CM | POA: Diagnosis not present

## 2022-09-13 DIAGNOSIS — E876 Hypokalemia: Secondary | ICD-10-CM | POA: Diagnosis present

## 2022-09-13 DIAGNOSIS — Z87891 Personal history of nicotine dependence: Secondary | ICD-10-CM

## 2022-09-13 DIAGNOSIS — Z8249 Family history of ischemic heart disease and other diseases of the circulatory system: Secondary | ICD-10-CM

## 2022-09-13 DIAGNOSIS — Z79899 Other long term (current) drug therapy: Secondary | ICD-10-CM | POA: Diagnosis not present

## 2022-09-13 DIAGNOSIS — E538 Deficiency of other specified B group vitamins: Secondary | ICD-10-CM | POA: Diagnosis present

## 2022-09-13 DIAGNOSIS — K121 Other forms of stomatitis: Secondary | ICD-10-CM | POA: Diagnosis present

## 2022-09-13 DIAGNOSIS — D649 Anemia, unspecified: Secondary | ICD-10-CM | POA: Diagnosis not present

## 2022-09-13 DIAGNOSIS — M109 Gout, unspecified: Secondary | ICD-10-CM | POA: Diagnosis present

## 2022-09-13 DIAGNOSIS — K5900 Constipation, unspecified: Secondary | ICD-10-CM | POA: Diagnosis present

## 2022-09-13 DIAGNOSIS — I5022 Chronic systolic (congestive) heart failure: Secondary | ICD-10-CM | POA: Diagnosis present

## 2022-09-13 DIAGNOSIS — E871 Hypo-osmolality and hyponatremia: Secondary | ICD-10-CM | POA: Diagnosis present

## 2022-09-13 DIAGNOSIS — E785 Hyperlipidemia, unspecified: Secondary | ICD-10-CM | POA: Diagnosis present

## 2022-09-13 DIAGNOSIS — I110 Hypertensive heart disease with heart failure: Secondary | ICD-10-CM | POA: Diagnosis present

## 2022-09-13 DIAGNOSIS — I2510 Atherosclerotic heart disease of native coronary artery without angina pectoris: Secondary | ICD-10-CM | POA: Diagnosis present

## 2022-09-13 DIAGNOSIS — K140 Glossitis: Secondary | ICD-10-CM | POA: Diagnosis present

## 2022-09-13 DIAGNOSIS — D709 Neutropenia, unspecified: Principal | ICD-10-CM

## 2022-09-13 DIAGNOSIS — D509 Iron deficiency anemia, unspecified: Secondary | ICD-10-CM | POA: Diagnosis present

## 2022-09-13 DIAGNOSIS — Z7982 Long term (current) use of aspirin: Secondary | ICD-10-CM | POA: Diagnosis not present

## 2022-09-13 DIAGNOSIS — I080 Rheumatic disorders of both mitral and aortic valves: Secondary | ICD-10-CM | POA: Diagnosis present

## 2022-09-13 DIAGNOSIS — R54 Age-related physical debility: Secondary | ICD-10-CM | POA: Diagnosis present

## 2022-09-13 DIAGNOSIS — D638 Anemia in other chronic diseases classified elsewhere: Secondary | ICD-10-CM | POA: Diagnosis present

## 2022-09-13 DIAGNOSIS — Z882 Allergy status to sulfonamides status: Secondary | ICD-10-CM

## 2022-09-13 DIAGNOSIS — Z792 Long term (current) use of antibiotics: Secondary | ICD-10-CM

## 2022-09-13 DIAGNOSIS — R531 Weakness: Secondary | ICD-10-CM

## 2022-09-13 DIAGNOSIS — R0602 Shortness of breath: Secondary | ICD-10-CM | POA: Diagnosis not present

## 2022-09-13 DIAGNOSIS — D61818 Other pancytopenia: Secondary | ICD-10-CM | POA: Diagnosis present

## 2022-09-13 DIAGNOSIS — Z1152 Encounter for screening for COVID-19: Secondary | ICD-10-CM | POA: Diagnosis not present

## 2022-09-13 DIAGNOSIS — D72819 Decreased white blood cell count, unspecified: Secondary | ICD-10-CM | POA: Diagnosis present

## 2022-09-13 DIAGNOSIS — R5383 Other fatigue: Secondary | ICD-10-CM

## 2022-09-13 DIAGNOSIS — Z951 Presence of aortocoronary bypass graft: Secondary | ICD-10-CM | POA: Diagnosis not present

## 2022-09-13 DIAGNOSIS — D708 Other neutropenia: Secondary | ICD-10-CM | POA: Diagnosis not present

## 2022-09-13 DIAGNOSIS — H9209 Otalgia, unspecified ear: Secondary | ICD-10-CM | POA: Diagnosis not present

## 2022-09-13 DIAGNOSIS — Z96651 Presence of right artificial knee joint: Secondary | ICD-10-CM | POA: Diagnosis present

## 2022-09-13 DIAGNOSIS — D696 Thrombocytopenia, unspecified: Secondary | ICD-10-CM | POA: Diagnosis not present

## 2022-09-13 LAB — FERRITIN: Ferritin: 538 ng/mL — ABNORMAL HIGH (ref 24–336)

## 2022-09-13 LAB — CBC WITH DIFFERENTIAL/PLATELET
Abs Immature Granulocytes: 0 10*3/uL (ref 0.00–0.07)
Basophils Absolute: 0 10*3/uL (ref 0.0–0.1)
Basophils Relative: 1 %
Eosinophils Absolute: 0 10*3/uL (ref 0.0–0.5)
Eosinophils Relative: 0 %
HCT: 25.3 % — ABNORMAL LOW (ref 39.0–52.0)
Hemoglobin: 8 g/dL — ABNORMAL LOW (ref 13.0–17.0)
Immature Granulocytes: 0 %
Lymphocytes Relative: 46 %
Lymphs Abs: 0.4 10*3/uL — ABNORMAL LOW (ref 0.7–4.0)
MCH: 29 pg (ref 26.0–34.0)
MCHC: 31.6 g/dL (ref 30.0–36.0)
MCV: 91.7 fL (ref 80.0–100.0)
Monocytes Absolute: 0.4 10*3/uL (ref 0.1–1.0)
Monocytes Relative: 44 %
Neutro Abs: 0.1 10*3/uL — CL (ref 1.7–7.7)
Neutrophils Relative %: 9 %
Platelets: 248 10*3/uL (ref 150–400)
RBC: 2.76 MIL/uL — ABNORMAL LOW (ref 4.22–5.81)
RDW: 15.8 % — ABNORMAL HIGH (ref 11.5–15.5)
WBC: 0.8 10*3/uL — CL (ref 4.0–10.5)
nRBC: 0 % (ref 0.0–0.2)

## 2022-09-13 LAB — T4, FREE: Free T4: 1.19 ng/dL — ABNORMAL HIGH (ref 0.61–1.12)

## 2022-09-13 LAB — COMPREHENSIVE METABOLIC PANEL
ALT: 52 U/L — ABNORMAL HIGH (ref 0–44)
AST: 77 U/L — ABNORMAL HIGH (ref 15–41)
Albumin: 2.7 g/dL — ABNORMAL LOW (ref 3.5–5.0)
Alkaline Phosphatase: 45 U/L (ref 38–126)
Anion gap: 10 (ref 5–15)
BUN: 14 mg/dL (ref 8–23)
CO2: 25 mmol/L (ref 22–32)
Calcium: 8.4 mg/dL — ABNORMAL LOW (ref 8.9–10.3)
Chloride: 94 mmol/L — ABNORMAL LOW (ref 98–111)
Creatinine, Ser: 0.82 mg/dL (ref 0.61–1.24)
GFR, Estimated: >60 mL/min (ref >60)
Glucose, Bld: 112 mg/dL — ABNORMAL HIGH (ref 70–99)
Potassium: 3.4 mmol/L — ABNORMAL LOW (ref 3.5–5.1)
Sodium: 129 mmol/L — ABNORMAL LOW (ref 135–145)
Total Bilirubin: 0.6 mg/dL (ref 0.3–1.2)
Total Protein: 5.9 g/dL — ABNORMAL LOW (ref 6.5–8.1)

## 2022-09-13 LAB — VITAMIN B12: Vitamin B-12: 929 pg/mL — ABNORMAL HIGH (ref 180–914)

## 2022-09-13 LAB — IRON AND TIBC
Iron: 16 ug/dL — ABNORMAL LOW (ref 45–182)
Saturation Ratios: 9 % — ABNORMAL LOW (ref 17.9–39.5)
TIBC: 179 ug/dL — ABNORMAL LOW (ref 250–450)
UIBC: 163 ug/dL

## 2022-09-13 LAB — FOLATE: Folate: 26.8 ng/mL (ref >5.9)

## 2022-09-13 LAB — TSH: TSH: 1.124 u[IU]/mL (ref 0.350–4.500)

## 2022-09-13 LAB — LACTATE DEHYDROGENASE: LDH: 139 U/L (ref 98–192)

## 2022-09-13 LAB — BRAIN NATRIURETIC PEPTIDE: B Natriuretic Peptide: 896 pg/mL — ABNORMAL HIGH (ref 0.0–100.0)

## 2022-09-13 LAB — RETICULOCYTES
Immature Retic Fract: 18.7 % — ABNORMAL HIGH (ref 2.3–15.9)
RBC.: 3.13 MIL/uL — ABNORMAL LOW (ref 4.22–5.81)
Retic Count, Absolute: 36.3 10*3/uL (ref 19.0–186.0)
Retic Ct Pct: 1.2 % (ref 0.4–3.1)

## 2022-09-13 LAB — POC OCCULT BLOOD, ED: Fecal Occult Bld: NEGATIVE

## 2022-09-13 LAB — TROPONIN I (HIGH SENSITIVITY)
Troponin I (High Sensitivity): 28 ng/L — ABNORMAL HIGH (ref <18)
Troponin I (High Sensitivity): 28 ng/L — ABNORMAL HIGH (ref <18)

## 2022-09-13 LAB — SARS CORONAVIRUS 2 BY RT PCR: SARS Coronavirus 2 by RT PCR: NEGATIVE

## 2022-09-13 LAB — MAGNESIUM: Magnesium: 1.9 mg/dL (ref 1.7–2.4)

## 2022-09-13 MED ORDER — DICLOFENAC SODIUM 1 % EX GEL
2.0000 g | Freq: Four times a day (QID) | CUTANEOUS | Status: DC
  Filled 2022-09-13: qty 100

## 2022-09-13 MED ORDER — ENOXAPARIN SODIUM 40 MG/0.4ML IJ SOSY
40.0000 mg | PREFILLED_SYRINGE | INTRAMUSCULAR | Status: DC
  Administered 2022-09-14 – 2022-09-16 (×3): 40 mg via SUBCUTANEOUS
  Filled 2022-09-13 (×4): qty 0.4

## 2022-09-13 MED ORDER — ACETAMINOPHEN 325 MG PO TABS: 650.0000 mg | ORAL_TABLET | Freq: Four times a day (QID) | ORAL | Status: DC | PRN

## 2022-09-13 MED ORDER — POLYETHYLENE GLYCOL 3350 17 G PO PACK
17.0000 g | PACK | Freq: Every day | ORAL | Status: DC
  Administered 2022-09-14 – 2022-09-17 (×2): 17 g via ORAL
  Filled 2022-09-13 (×2): qty 1

## 2022-09-13 MED ORDER — HYDRALAZINE HCL 20 MG/ML IJ SOLN: 5.0000 mg | Freq: Three times a day (TID) | INTRAMUSCULAR | Status: DC | PRN

## 2022-09-13 MED ORDER — LACTATED RINGERS IV SOLN: INTRAVENOUS | Status: DC

## 2022-09-13 MED ORDER — AMOXICILLIN 500 MG PO CAPS
1000.0000 mg | ORAL_CAPSULE | Freq: Three times a day (TID) | ORAL | Status: DC
  Administered 2022-09-13 – 2022-09-14 (×2): 1000 mg via ORAL
  Filled 2022-09-13 (×3): qty 2

## 2022-09-13 MED ORDER — ADULT MULTIVITAMIN W/MINERALS CH
1.0000 | ORAL_TABLET | Freq: Every day | ORAL | Status: DC
  Administered 2022-09-14 – 2022-09-17 (×4): 1 via ORAL
  Filled 2022-09-13 (×4): qty 1

## 2022-09-13 MED ORDER — ASPIRIN 81 MG PO TBEC
81.0000 mg | DELAYED_RELEASE_TABLET | Freq: Every day | ORAL | Status: DC
  Administered 2022-09-14 – 2022-09-17 (×4): 81 mg via ORAL
  Filled 2022-09-13 (×4): qty 1

## 2022-09-13 MED ORDER — TRAZODONE HCL 50 MG PO TABS: 25.0000 mg | ORAL_TABLET | Freq: Every evening | ORAL | Status: DC | PRN

## 2022-09-13 MED ORDER — SODIUM CHLORIDE 0.9% FLUSH
3.0000 mL | Freq: Two times a day (BID) | INTRAVENOUS | Status: DC
  Administered 2022-09-13 – 2022-09-17 (×8): 3 mL via INTRAVENOUS

## 2022-09-13 MED ORDER — TORSEMIDE 20 MG PO TABS
10.0000 mg | ORAL_TABLET | Freq: Two times a day (BID) | ORAL | Status: DC
  Administered 2022-09-13 – 2022-09-17 (×8): 10 mg via ORAL
  Filled 2022-09-13 (×7): qty 1

## 2022-09-13 MED ORDER — BISACODYL 5 MG PO TBEC: 5.0000 mg | DELAYED_RELEASE_TABLET | Freq: Every day | ORAL | Status: DC | PRN

## 2022-09-13 MED ORDER — ACETAMINOPHEN 650 MG RE SUPP: 650.0000 mg | Freq: Four times a day (QID) | RECTAL | Status: DC | PRN

## 2022-09-13 MED ORDER — OMEGA-3-ACID ETHYL ESTERS 1 G PO CAPS
2.0000 | ORAL_CAPSULE | Freq: Every day | ORAL | Status: DC
  Administered 2022-09-14 – 2022-09-17 (×4): 2 g via ORAL
  Filled 2022-09-13 (×4): qty 2

## 2022-09-13 MED ORDER — ATORVASTATIN CALCIUM 40 MG PO TABS
40.0000 mg | ORAL_TABLET | Freq: Every day | ORAL | Status: DC
  Administered 2022-09-14: 40 mg via ORAL
  Filled 2022-09-13: qty 1

## 2022-09-13 MED ORDER — TAMSULOSIN HCL 0.4 MG PO CAPS
0.4000 mg | ORAL_CAPSULE | Freq: Every day | ORAL | Status: DC
  Administered 2022-09-14 – 2022-09-17 (×4): 0.4 mg via ORAL
  Filled 2022-09-13 (×4): qty 1

## 2022-09-13 MED ORDER — SENNOSIDES-DOCUSATE SODIUM 8.6-50 MG PO TABS: 1.0000 | ORAL_TABLET | Freq: Every evening | ORAL | Status: DC | PRN

## 2022-09-13 MED ORDER — ONDANSETRON HCL 4 MG PO TABS: 4.0000 mg | ORAL_TABLET | Freq: Four times a day (QID) | ORAL | Status: DC | PRN

## 2022-09-13 MED ORDER — MIRABEGRON ER 25 MG PO TB24
25.0000 mg | ORAL_TABLET | Freq: Every day | ORAL | Status: DC
  Administered 2022-09-14 – 2022-09-17 (×4): 25 mg via ORAL
  Filled 2022-09-13 (×4): qty 1

## 2022-09-13 MED ORDER — LORATADINE 10 MG PO TABS
10.0000 mg | ORAL_TABLET | Freq: Every day | ORAL | Status: DC
  Administered 2022-09-14 – 2022-09-17 (×4): 10 mg via ORAL
  Filled 2022-09-13 (×4): qty 1

## 2022-09-13 MED ORDER — METOPROLOL SUCCINATE ER 25 MG PO TB24
12.5000 mg | ORAL_TABLET | Freq: Every day | ORAL | Status: DC
  Administered 2022-09-14 – 2022-09-17 (×4): 12.5 mg via ORAL
  Filled 2022-09-13 (×4): qty 1

## 2022-09-13 MED ORDER — HYDROCODONE-ACETAMINOPHEN 5-325 MG PO TABS: 1.0000 | ORAL_TABLET | ORAL | Status: DC | PRN

## 2022-09-13 MED ORDER — LACTATED RINGERS IV BOLUS
1000.0000 mL | Freq: Once | INTRAVENOUS | Status: AC
  Administered 2022-09-13: 1000 mL via INTRAVENOUS

## 2022-09-13 MED ORDER — ONDANSETRON HCL 4 MG/2ML IJ SOLN: 4.0000 mg | Freq: Four times a day (QID) | INTRAMUSCULAR | Status: DC | PRN

## 2022-09-13 MED ORDER — MORPHINE SULFATE (PF) 2 MG/ML IV SOLN: 1.0000 mg | Freq: Four times a day (QID) | INTRAVENOUS | Status: DC | PRN

## 2022-09-13 MED ORDER — VITAMIN C 500 MG PO TABS
500.0000 mg | ORAL_TABLET | Freq: Every day | ORAL | Status: DC
  Administered 2022-09-14 – 2022-09-17 (×4): 500 mg via ORAL
  Filled 2022-09-13 (×4): qty 1

## 2022-09-13 NOTE — ED Notes (Signed)
Nurse received pt in bed aaox4, watching videos on his phone. Call light at bed side.

## 2022-09-13 NOTE — ED Notes (Signed)
CRITICAL VALUE STICKER  CRITICAL VALUE: WBC/0.8  Neutrophil 0.1 RECEIVER (on-site recipient of call):  DATE & TIME NOTIFIED:   MESSENGER (representative from lab):  MD NOTIFIED: [place]   TIME OF NOTIFICATION:1250  RESPONSE:  ok

## 2022-09-13 NOTE — ED Notes (Signed)
ED TO INPATIENT HANDOFF REPORT  Name/Age/Gender [name] 82 y.o. male  Code Status    Code Status Orders  (From admission, onward)           Start     Ordered   09/13/22 1656  Full code  Continuous       Question:  By:  Answer:  Consent: discussion documented in EHR   09/13/22 1656           Code Status History     Date Active Date Inactive Code Status Order ID Comments User Context   07/28/2022 0731 08/03/2022 2135 Full Code [id number]  Maryln Gottron, MD ED   06/24/2022 0359 06/28/2022 1855 Full Code [id number]  Briscoe Deutscher, MD ED   09/20/2021 1028 12/01/2021 1004 Full Code [id number]  Ngetich, Donalee Citrin, NP Outpatient   08/07/2019 1411 08/08/2019 1709 Full Code [id number]  Derenda Fennel, PA Inpatient   07/24/2017 1411 07/29/2017 1443 Full Code [id number]  Zada Girt Inpatient   07/10/2017 1139 07/12/2017 1553 Full Code [id number]  Elder Negus, MD Inpatient   07/07/2017 1412 07/10/2017 1139 Full Code [id number]  Elder Negus, MD Inpatient      Advance Directive Documentation    Flowsheet Row Most Recent Value  Type of Advance Directive Living will, Healthcare Power of Attorney  Pre-existing out of facility DNR order (yellow form or pink MOST form) --  "MOST" Form in Place? --       Home/SNF/Other Home  Chief Complaint Leukopenia [D72.819]  Level of Care/Admitting Diagnosis ED Disposition     ED Disposition  Admit   Condition  --   Comment  Hospital Area: [facility] [[id number]]  Level of Care: Med-Surg [16]  May place patient in observation at [facility] or [facility] if equivalent level of care is available:: No  Covid Evaluation: Asymptomatic - no recent exposure (last 10 days) testing not required  Diagnosis: Leukopenia [200064]  Admitting Physician: Tonye Royalty [[id number]]  Attending Physician: Janann Colonel          Medical History Past Medical History:  Diagnosis Date    Anemia    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    HFrEF (heart failure with reduced ejection fraction) (HCC)    Hypertension    Moderate mitral regurgitation    Severe aortic stenosis     Allergies Allergies  Allergen Reactions   Sulfamethoxazole-Trimethoprim Rash    IV Location/Drains/Wounds Patient Lines/Drains/Airways Status     Active Line/Drains/Airways     Name Placement date Placement time Site Days   Peripheral IV 09/13/22 20 G 1" Anterior;Proximal;Right Forearm 09/13/22  1154  Forearm  less than 1   PICC Single Lumen 08/02/22 Right Brachial 38 cm 0 cm 08/02/22  1703  Brachial  42            Labs/Imaging Results for orders placed or performed during the hospital encounter of 09/13/22 (from the past 48 hour(s))  SARS Coronavirus 2 by RT PCR (hospital order, performed in [facility] hospital lab) *cepheid single result test* Anterior Nasal Swab     Status: None   Collection Time: 09/13/22 11:50 AM   Specimen: Anterior Nasal Swab  Result Value Ref Range   SARS Coronavirus 2 by RT PCR NEGATIVE NEGATIVE    Comment: (NOTE) SARS-CoV-2  target nucleic acids are NOT DETECTED.  The SARS-CoV-2 RNA is generally detectable in upper and lower respiratory specimens during the acute phase of infection. The lowest concentration of SARS-CoV-2 viral copies this assay can detect is 250 copies / mL. A negative result does not preclude SARS-CoV-2 infection and should not be used as the sole basis for treatment or other patient management decisions.  A negative result may occur with improper specimen collection / handling, submission of specimen other than nasopharyngeal swab, presence of viral mutation(s) within the areas targeted by this assay, and inadequate number of viral copies (<250 copies / mL). A negative result must be combined with clinical observations, patient history, and  epidemiological information.  Fact Sheet for Patients:   RoadLapTop.co.za  Fact Sheet for Healthcare Providers: http://kim-miller.com/  This test is not yet approved or  cleared by the [place] FDA and has been authorized for detection and/or diagnosis of SARS-CoV-2 by FDA under an Emergency Use Authorization (EUA).  This EUA will remain in effect (meaning this test can be used) for the duration of the COVID-19 declaration under Section 564(b)(1) of the Act, 21 U.S.C. section 360bbb-3(b)(1), unless the authorization is terminated or revoked sooner.  Performed at [facility], [address] [place], [place] [address]   TSH     Status: None   Collection Time: 09/13/22 11:50 AM  Result Value Ref Range   TSH 1.124 0.350 - 4.500 uIU/mL    Comment: Performed by a 3rd Generation assay with a functional sensitivity of <=0.01 uIU/mL. Performed at [facility], [address] [place], [place] [address]   T4, free     Status: Abnormal   Collection Time: 09/13/22 11:50 AM  Result Value Ref Range   Free T4 1.19 (H) 0.61 - 1.12 ng/dL    Comment: (NOTE) Biotin ingestion may interfere with free T4 tests. If the results are inconsistent with the TSH level, previous test results, or the clinical presentation, then consider biotin interference. If needed, order repeat testing after stopping biotin. Performed at [facility], [address] [place], [place] [address]   Brain natriuretic peptide     Status: Abnormal   Collection Time: 09/13/22 11:50 AM  Result Value Ref Range   B Natriuretic Peptide 896.0 (H) 0.0 - 100.0 pg/mL    Comment: Performed at [facility], [address] [place], [place] [address]  CBC with Differential     Status: Abnormal   Collection Time: 09/13/22 11:53 AM  Result Value Ref Range   WBC 0.8 (LL) 4.0 - 10.5 K/uL    Comment: REPEATED TO VERIFY THIS  CRITICAL RESULT HAS VERIFIED AND BEEN CALLED TO MOORE, A. RN BY NICOLE MCCOY ON 08 06 2024 AT 1244, AND HAS BEEN READ BACK.     RBC 2.76 (L) 4.22 - 5.81 MIL/uL   Hemoglobin 8.0 (L) 13.0 - 17.0 g/dL   HCT 36.6 (L) 44.0 - 34.7 %   MCV 91.7 80.0 - 100.0 fL   MCH 29.0 26.0 - 34.0 pg   MCHC 31.6 30.0 - 36.0 g/dL   RDW 42.5 (H) 95.6 - 38.7 %   Platelets 248 150 - 400 K/uL   nRBC 0.0 0.0 - 0.2 %   Neutrophils Relative % 9 %   Neutro Abs 0.1 (LL) 1.7 - 7.7 K/uL    Comment: REPEATED TO VERIFY THIS CRITICAL RESULT HAS VERIFIED AND BEEN CALLED TO MOORE, A. RN BY NICOLE MCCOY ON 08  06 2024 AT 1244, AND HAS BEEN READ BACK.     Lymphocytes Relative 46 %   Lymphs Abs 0.4 (L) 0.7 - 4.0 K/uL   Monocytes Relative 44 %   Monocytes Absolute 0.4 0.1 - 1.0 K/uL   Eosinophils Relative 0 %   Eosinophils Absolute 0.0 0.0 - 0.5 K/uL   Basophils Relative 1 %   Basophils Absolute 0.0 0.0 - 0.1 K/uL   Immature Granulocytes 0 %   Abs Immature Granulocytes 0.00 0.00 - 0.07 K/uL    Comment: Performed at [facility], [address] [place], [place] [address]  Comprehensive metabolic panel     Status: Abnormal   Collection Time: 09/13/22 11:53 AM  Result Value Ref Range   Sodium 129 (L) 135 - 145 mmol/L   Potassium 3.4 (L) 3.5 - 5.1 mmol/L   Chloride 94 (L) 98 - 111 mmol/L   CO2 25 22 - 32 mmol/L   Glucose, Bld 112 (H) 70 - 99 mg/dL    Comment: Glucose reference range applies only to samples taken after fasting for at least 8 hours.   BUN 14 8 - 23 mg/dL   Creatinine, Ser 1.47 0.61 - 1.24 mg/dL   Calcium 8.4 (L) 8.9 - 10.3 mg/dL   Total Protein 5.9 (L) 6.5 - 8.1 g/dL   Albumin 2.7 (L) 3.5 - 5.0 g/dL   AST 77 (H) 15 - 41 U/L   ALT 52 (H) 0 - 44 U/L   Alkaline Phosphatase 45 38 - 126 U/L   Total Bilirubin 0.6 0.3 - 1.2 mg/dL   GFR, Estimated >82 >95 mL/min    Comment: (NOTE) Calculated using the CKD-EPI Creatinine Equation (2021)    Anion gap 10 5 - 15    Comment: Performed at  [facility], [address] [place], [place] [address]  Magnesium     Status: None   Collection Time: 09/13/22 11:53 AM  Result Value Ref Range   Magnesium 1.9 1.7 - 2.4 mg/dL    Comment: Performed at [facility], [address] [place], [place] [address]  Troponin I (High Sensitivity)     Status: Abnormal   Collection Time: 09/13/22 11:53 AM  Result Value Ref Range   Troponin I (High Sensitivity) 28 (H) <18 ng/L    Comment: (NOTE) Elevated high sensitivity troponin I (hsTnI) values and significant  changes across serial measurements may suggest ACS but many other  chronic and acute conditions are known to elevate hsTnI results.  Refer to the "Links" section for chest pain algorithms and additional  guidance. Performed at [facility], [address] [place], [place] [address]   Troponin I (High Sensitivity)     Status: Abnormal   Collection Time: 09/13/22  1:44 PM  Result Value Ref Range   Troponin I (High Sensitivity) 28 (H) <18 ng/L    Comment: (NOTE) Elevated high sensitivity troponin I (hsTnI) values and significant  changes across serial measurements may suggest ACS but many other  chronic and acute conditions are known to elevate hsTnI results.  Refer to the "Links" section for chest pain algorithms and additional  guidance. Performed at [facility], [address] [place], [place] [address]   POC occult blood, ED     Status: None   Collection Time: 09/13/22  4:10 PM  Result Value Ref Range   Fecal Occult Bld NEGATIVE NEGATIVE   DG Chest Portable 1 View  Result Date: 09/13/2022  CLINICAL DATA:  Weakness EXAM: PORTABLE CHEST 1 VIEW COMPARISON:  07/28/2022 FINDINGS: Stable heart size status post sternotomy, CABG, cardiac valve replacement, and left atrial appendage clipping. Aortic atherosclerosis. No focal airspace consolidation, pleural effusion, or pneumothorax. IMPRESSION: No active  disease. Electronically Signed   By: Duanne Guess D.O.   On: 09/13/2022 12:08   CT HEAD WO CONTRAST ( )  Result Date: 09/13/2022 CLINICAL DATA:  82 year old male with weakness and difficulty swallowing. EXAM: CT HEAD WITHOUT CONTRAST TECHNIQUE: Contiguous axial images were obtained from the base of the skull through the vertex without intravenous contrast. RADIATION DOSE REDUCTION: This exam was performed according to the departmental dose-optimization program which includes automated exposure control, adjustment of the mA and/or kV according to patient size and/or use of iterative reconstruction technique. COMPARISON:  None Available. FINDINGS: Brain: Cerebral volume is within normal limits for age. No midline shift, ventriculomegaly, mass effect, evidence of mass lesion, intracranial hemorrhage or evidence of cortically based acute infarction. Gray-white differentiation is within normal limits for age. No encephalomalacia is identified. Vascular: No suspicious intracranial vascular hyperdensity. Calcified atherosclerosis at the skull base. Skull: Intact, negative. Sinuses/Orbits: Visualized paranasal sinuses and mastoids are well aerated. Mild ethmoid and right maxillary mucosal thickening. Other: No acute orbit or scalp soft tissue finding. IMPRESSION: Normal for age noncontrast CT appearance of the Brain. Electronically Signed   By: Odessa Fleming M.D.   On: 09/13/2022 11:47    Pending Labs Unresulted Labs (From admission, onward)     Start     Ordered   09/13/22 1802  Ferritin  ONCE - URGENT,   URGENT        09/13/22 1801   09/13/22 1802  Iron and TIBC  ONCE - URGENT,   URGENT        09/13/22 1801   09/13/22 1802  Folate  ONCE - URGENT,   URGENT        09/13/22 1801   09/13/22 1753  Methylmalonic acid, serum  ONCE - URGENT,   URGENT        09/13/22 1753   09/13/22 1752  Vitamin B12  ONCE - URGENT,   URGENT        09/13/22 1753   09/13/22 1751  Reticulocytes  Once,   R       Comments: Add to  lab draw today    09/13/22 1753   Signed and Held  CBC  (enoxaparin (LOVENOX)    CrCl >/= 30 ml/min)  Once,   R       Comments: Baseline for enoxaparin therapy IF NOT ALREADY DRAWN.  Notify MD if PLT < 100 K.    Signed and Held   Signed and Held  Creatinine, serum  (enoxaparin (LOVENOX)    CrCl >/= 30 ml/min)  Once,   R       Comments: Baseline for enoxaparin therapy IF NOT ALREADY DRAWN.    Signed and Held   Signed and Held  Creatinine, serum  (enoxaparin (LOVENOX)    CrCl >/= 30 ml/min)  Weekly,   R     Comments: while on enoxaparin therapy    Signed and Held   Signed and Held  Comprehensive metabolic panel  Tomorrow morning,   R        Signed and Held   Signed and Held  CBC  Tomorrow morning,   R        Signed and Held  Vitals/Pain Today's Vitals   09/13/22 1422 09/13/22 1500 09/13/22 1820 09/13/22 1823  BP:  (!) 118/50 (!) 148/86   Pulse:  62 88   Resp:  17 (!) 22 18  Temp: 98.5 F (36.9 C)  98.8 F (37.1 C)   TempSrc: Oral  Oral   SpO2:  93% 98%   Weight:      Height:      PainSc:        Isolation Precautions No active isolations  Medications Medications  lactated ringers bolus 1,000 mL (1,000 mLs Intravenous New Bag/Given 09/13/22 1155)    Mobility walks with person assist

## 2022-09-13 NOTE — ED Provider Notes (Addendum)
[place] EMERGENCY DEPARTMENT AT [facility] Provider Note   CSN: [id number] Arrival date & time: 09/13/22  6295     History  Chief Complaint  Patient presents with   Weakness    [name] is a 82 y.o. male.   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  The patient states that he has not had a bowel movement in the past 5 days other than a small amount.  He took MiraLAX at home without significant relief.  He is passing gas.  He denies any significant lower extremity bright red blood per rectum or dark sticky stools.  Home Medications Prior to Admission medications   Medication Sig Start Date End Date Taking? Authorizing Provider  acetaminophen (TYLENOL) 500 MG tablet Take 2 tablets (1,000 mg total) by mouth every 6 (six) hours as needed for mild pain or fever. 07/29/17   Barrett, Erin R, PA-C  amoxicillin (AMOXIL) 500 MG tablet Take 2 tablets (1,000 mg total) by mouth 3 (three) times daily. 08/30/22   Odette Fraction, MD  aspirin EC 81 MG tablet Take 81 mg by mouth daily. Swallow whole.    [provider]  atorvastatin (LIPITOR) 40 MG tablet Take 1 tablet (40 mg total) by mouth daily. 08/04/22   Glade Lloyd, MD  Cetirizine HCl (ZYRTEC ALLERGY) 10 MG CAPS Take 10 mg by mouth daily as needed (as needed for allergies). 05/09/22   [provider]  [employer] VITAMIN C 500 MG tablet TAKE 1 TABLET (500 MG TOTAL) BY MOUTH DAILY. 08/23/22   Ngetich, Dinah C, NP  diclofenac Sodium (VOLTAREN) 1 % GEL Apply 2 g topically 4 (four) times daily. To low back 06/28/22   Leroy Sea, MD  metoprolol succinate (TOPROL-XL) 25 MG 24 hr tablet Take 0.5 tablets (12.5 mg total) by mouth daily.  Take with or immediately following a meal. 08/25/22 11/23/22  Patwardhan, Anabel Bene, MD  Multiple Vitamin (MULTIVITAMIN ADULT PO) Take 1 tablet by mouth daily.    [provider]  MYRBETRIQ 25 MG TB24 tablet Take 25 mg by mouth daily. 05/31/22   [provider]  Omega-3 Fatty Acids (FISH OIL) 1000 MG CAPS Take 2 capsules by mouth daily.    [provider]  polyethylene glycol (MIRALAX) 17 g packet Take 17 g by mouth daily. 09/12/22 10/12/22  Fletcher Anon, NP  silodosin (RAPAFLO) 8 MG CAPS capsule Take 8 mg by mouth daily. 11/15/21   [provider]  torsemide (DEMADEX) 10 MG tablet TAKE 1 TABLET BY MOUTH TWICE A DAY 09/01/22   Patwardhan, Anabel Bene, MD      Allergies    Sulfamethoxazole-trimethoprim    Review of Systems   Review of Systems  Neurological:  Positive for weakness.    Physical Exam Updated Vital Signs BP (!) 118/50   Pulse 62   Temp 98.5 F (36.9 C) (Oral)   Resp 17   Ht 5\' 11"  (1.803 m)   Wt 73.5 kg   SpO2 93%   BMI 22.59 kg/m  Physical Exam Vitals and nursing note reviewed. Exam conducted with a chaperone present.  Constitutional:      General: He is not in acute distress.    Appearance: He is well-developed.  HENT:     Head:  Normocephalic and atraumatic.     Comments: Mucosal erosions/lesions present along the tongue    Right Ear: Tympanic membrane and ear canal normal.     Left Ear: Tympanic membrane and ear canal normal.  Eyes:     Conjunctiva/sclera: Conjunctivae normal.  Cardiovascular:     Rate and Rhythm: Normal rate and regular rhythm.  Pulmonary:     Effort: Pulmonary effort is normal. No respiratory distress.     Breath sounds: Normal breath sounds.  Abdominal:     General: There is distension.     Palpations: Abdomen is soft.     Tenderness: There is no abdominal tenderness.  Genitourinary:    Rectum: Guaiac result negative.     Comments: Hemorrhoid present, nonthrombosed and nontender, not actively bleeding,  no melena or hematochezia, fecal occult negative Musculoskeletal:        General: No swelling.     Cervical back: Neck supple.  Skin:    General: Skin is warm and dry.     Capillary Refill: Capillary refill takes less than 2 seconds.     Coloration: Skin is pale.  Neurological:     General: No focal deficit present.     Mental Status: He is alert and oriented to person, place, and time. Mental status is at baseline.     Comments: MENTAL STATUS EXAM:    Orientation: Alert and oriented to person, place and time.  Memory: Cooperative, follows commands well.  Language: Speech is clear and language is normal.   CRANIAL NERVES:    CN 2 (Optic): Visual fields intact to confrontation.  CN 3,4,6 (EOM): Pupils equal and reactive to light. Full extraocular eye movement without nystagmus.  CN 5 (Trigeminal): Facial sensation is normal, no weakness of masticatory muscles.  CN 7 (Facial): No facial weakness or asymmetry.  CN 8 (Auditory): Auditory acuity grossly normal.  CN 9,10 (Glossophar): The uvula is midline, the palate elevates symmetrically.  CN 11 (spinal access): Normal sternocleidomastoid and trapezius strength.  CN 12 (Hypoglossal): The tongue is midline. No atrophy or fasciculations.Marland Kitchen   MOTOR:  Muscle Strength: 5/5RUE, 5/5LUE, 5/5RLE, 5/5LLE.   COORDINATION:   Intact finger-to-nose, no tremor.   SENSATION:   Intact to light touch all four extremities.    Psychiatric:        Mood and Affect: Mood normal.     ED Results / Procedures / Treatments   Labs (all labs ordered are listed, but only abnormal results are displayed) Labs Reviewed  CBC WITH DIFFERENTIAL/PLATELET - Abnormal; Notable for the following components:      Result Value   WBC 0.8 (*)    RBC 2.76 (*)    Hemoglobin 8.0 (*)    HCT 25.3 (*)    RDW 15.8 (*)    Neutro Abs 0.1 (*)    Lymphs Abs 0.4 (*)    All other components within normal limits  COMPREHENSIVE METABOLIC PANEL - Abnormal; Notable for the  following components:   Sodium 129 (*)    Potassium 3.4 (*)    Chloride 94 (*)    Glucose, Bld 112 (*)    Calcium 8.4 (*)    Total Protein 5.9 (*)    Albumin 2.7 (*)    AST 77 (*)    ALT 52 (*)    All other components within normal limits  T4, FREE - Abnormal; Notable for the following components:   Free T4 1.19 (*)    All other components within normal limits  BRAIN  NATRIURETIC PEPTIDE - Abnormal; Notable for the following components:   B Natriuretic Peptide 896.0 (*)    All other components within normal limits  TROPONIN I (HIGH SENSITIVITY) - Abnormal; Notable for the following components:   Troponin I (High Sensitivity) 28 (*)    All other components within normal limits  TROPONIN I (HIGH SENSITIVITY) - Abnormal; Notable for the following components:   Troponin I (High Sensitivity) 28 (*)    All other components within normal limits  SARS CORONAVIRUS 2 BY RT PCR  MAGNESIUM  TSH  POC OCCULT BLOOD, ED    EKG EKG Interpretation Date/Time:  Tuesday September 13 2022 09:53:55 EDT Ventricular Rate:  91 PR Interval:  143 QRS Duration:  161 QT Interval:  449 QTC Calculation: 553 R Axis:   95  Text Interpretation: Sinus rhythm Supraventricular bigeminy RBBB and LPFB Confirmed by Ernie Avena ([id number]) on 09/13/2022 1:06:16 PM  Radiology DG Chest Portable 1 View  Result Date: 09/13/2022 CLINICAL DATA:  Weakness EXAM: PORTABLE CHEST 1 VIEW COMPARISON:  07/28/2022 FINDINGS: Stable heart size status post sternotomy, CABG, cardiac valve replacement, and left atrial appendage clipping. Aortic atherosclerosis. No focal airspace consolidation, pleural effusion, or pneumothorax. IMPRESSION: No active disease. Electronically Signed   By: Duanne Guess D.O.   On: 09/13/2022 12:08   CT HEAD WO CONTRAST ( )  Result Date: 09/13/2022 CLINICAL DATA:  82 year old male with weakness and difficulty swallowing. EXAM: CT HEAD WITHOUT CONTRAST TECHNIQUE: Contiguous axial images were obtained from  the base of the skull through the vertex without intravenous contrast. RADIATION DOSE REDUCTION: This exam was performed according to the departmental dose-optimization program which includes automated exposure control, adjustment of the mA and/or kV according to patient size and/or use of iterative reconstruction technique. COMPARISON:  None Available. FINDINGS: Brain: Cerebral volume is within normal limits for age. No midline shift, ventriculomegaly, mass effect, evidence of mass lesion, intracranial hemorrhage or evidence of cortically based acute infarction. Gray-white differentiation is within normal limits for age. No encephalomalacia is identified. Vascular: No suspicious intracranial vascular hyperdensity. Calcified atherosclerosis at the skull base. Skull: Intact, negative. Sinuses/Orbits: Visualized paranasal sinuses and mastoids are well aerated. Mild ethmoid and right maxillary mucosal thickening. Other: No acute orbit or scalp soft tissue finding. IMPRESSION: Normal for age noncontrast CT appearance of the Brain. Electronically Signed   By: Odessa Fleming M.D.   On: 09/13/2022 11:47    Procedures Procedures    Medications Ordered in ED Medications  lactated ringers bolus 1,000 mL (1,000 mLs Intravenous New Bag/Given 09/13/22 1155)    ED Course/ Medical Decision Making/ A&P                                 Medical Decision Making Amount and/or Complexity of Data Reviewed Labs: ordered. Radiology: ordered.  Risk Decision regarding hospitalization.   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  The patient states that he has not had a bowel movement in the past 5 days other than a small amount.  He took MiraLAX at home  without significant relief.  He is passing gas.  He denies any significant lower extremity bright red blood per rectum or dark sticky stools.  On arrival, the patient was  afebrile, not tachycardic or tachypneic, hemodynamically stable, saturating well on room air.  Sinus rhythm noted on cardiac telemetry.  Patient presenting with fatigue and lightheadedness which is nonspecific, afebrile, no clear infectious symptoms.  He has been having episodes of constipation with difficulty having a bowel movement over the past week and has been taking MiraLAX at home.  While in the emergency department, the patient had 2 successful bowel movements, nonmelanotic.  His fecal occult was negative.  Differential diagnosis includes electrolyte abnormality, anemia, thyroid dysfunction, malignancy, less likely infectious etiology given lack of symptoms.  Patient was administered IV fluids.  He denies any cough, chest pain or shortness of breath and his lungs are clear on auscultation.  An x-ray of the chest revealed no focal abnormality.  He is neurologic exam is normal and a CT head was unremarkable.  Laboratory evaluation revealed a critically low neutrophilia.  WBCs were noted to be 0.8, neutrophils were 100.  Previous measurements were normal in the past month.  His hemoglobin is anemic to 8.0 but stable.  His platelet count was normal at 248.  Low concern for acute GI bleed at this time.  I consulted Dr. Mosetta Putt of on-call hematology oncology who recommended admission for further workup of the patient's leukopenia and neutrophilia.  No recent viral syndrome to suggest infectious etiology.  COVID-19 PCR testing was collected and resulted negative.  TSH was normal, remainder of laboratory evaluation significant for fecal occult negative stool, CMP with hyponatremia to 129, mild hypokalemia to 3.4, mildly elevated LFTs with an AST of 77, ALT of 52, normal renal function.  Opponents were mildly elevated and flat at 28.  The patient  denies any chest discomfort at this time.    The patient was well-appearing following fluid resuscitation.  Discussed admission for further workup and the patient was in agreement.  Hospitalist medicine was consulted for admission.   Final Clinical Impression(s) / ED Diagnoses Final diagnoses:  Neutropenia, unspecified type (HCC)  Leukopenia, unspecified type  Weakness  Other fatigue  Anemia, unspecified type    Rx / DC Orders ED Discharge Orders     None         Ernie Avena, MD 09/13/22 1619    Ernie Avena, MD 09/13/22 1646    Ernie Avena, MD 09/13/22 1825

## 2022-09-13 NOTE — Progress Notes (Signed)
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  He presented to the emergency room today, [facility] showed a WBC 0.8, with ANC 0.1, hemoglobin 8.0, platelet 248.  CMP showed a sodium 129, normal creatinine, [place] elevated liver enzymes.  Patient denies any fever, chills, cough, or dysuria.   Patient was previously seen by my partner Dr. Truett Perna on 05/13/2022 for anemia, and a mild leukocytopenia. B12 level on 05/13/2022 was low at 159, multiple myeloma panel was negative. Pt was recommended to start oral B12 at daily, he took a B12 for couple months, then stopped, he thought it was stopped by his cardiologist.    Objective:  Vitals:   09/13/22 1422 09/13/22 1500  BP:  (!) 118/50  Pulse:  62  Resp:  17  Temp: 98.5 F (36.9 C)   SpO2:  93%    Body mass index is 22.59 kg/m. No intake or output data in the 24 hours ending 09/13/22 1739   Sclerae unicteric  Oropharynx clear  No peripheral adenopathy  Lungs clear -- no rales or rhonchi  Heart regular rate and rhythm  Abdomen benign  MSK no focal spinal tenderness, no peripheral edema  Neuro nonfocal   CBG (last 3)  No results for input(s): "GLUCAP" in the last 72 hours.   Labs:   Urine Studies No results for input(s): "UHGB", "CRYS" in the last 72 hours.  Invalid input(s): "UACOL", "UAPR", "USPG", "UPH", "UTP", "UGL", "UKET", "UBIL", "UNIT", "UROB", "ULEU", "UEPI", "UWBC", "URBC", "UBAC", "CAST", "UCOM", "BILUA"  Basic Metabolic Panel: Recent Labs  Lab 09/13/22 1153  NA 129*  K 3.4*  CL 94*   CO2 25  GLUCOSE 112*  BUN 14  CREATININE 0.82  CALCIUM 8.4*  MG 1.9   GFR Estimated Creatinine Clearance: 73.5 mL/min (by C-G formula based on SCr of 0.82 mg/dL). Liver Function Tests: Recent Labs  Lab 09/13/22 1153  AST 77*  ALT 52*  ALKPHOS 45  BILITOT 0.6  PROT 5.9*  ALBUMIN 2.7*   No results for input(s): "LIPASE", "AMYLASE" in the last 168 hours. No results for input(s): "AMMONIA" in the last 168 hours. Coagulation profile No results for input(s): "INR", "PROTIME" in the last 168 hours.  CBC: Recent Labs  Lab 09/13/22 1153  WBC 0.8*  NEUTROABS 0.1*  HGB 8.0*  HCT 25.3*  MCV 91.7  PLT 248   Cardiac Enzymes: No results for input(s): "CKTOTAL", "CKMB", "CKMBINDEX", "TROPONINI" in the last 168 hours. BNP: Invalid input(s): "POCBNP" CBG: No results for input(s): "GLUCAP" in the last 168 hours. D-Dimer No results for input(s): "DDIMER" in the last 72 hours. Hgb A1c No results for input(s): "HGBA1C" in the last 72 hours. Lipid Profile No results for input(s): "CHOL", "HDL", "LDLCALC", "TRIG", "CHOLHDL", "LDLDIRECT" in the last 72 hours. Thyroid function studies Recent Labs    09/13/22 1150  TSH 1.124   Anemia work up No results for input(s): "VITAMINB12", "FOLATE", "FERRITIN", "TIBC", "IRON", "RETICCTPCT" in the last 72 hours. Microbiology Recent Results (from the past 240 hour(s))  SARS Coronavirus 2 by RT PCR (hospital order, performed in [facility]  hospital lab) *cepheid single result test* Anterior Nasal Swab     Status: None   Collection Time: 09/13/22 11:50 AM   Specimen: Anterior Nasal Swab  Result Value Ref Range Status   SARS Coronavirus 2 by RT PCR NEGATIVE NEGATIVE Final    Comment: (NOTE) SARS-CoV-2 target nucleic acids are NOT DETECTED.  The SARS-CoV-2 RNA is generally detectable in upper and lower respiratory specimens during the acute phase of infection. The lowest concentration of SARS-CoV-2 viral copies this assay can detect is  250 copies / mL. A negative result does not preclude SARS-CoV-2 infection and should not be used as the sole basis for treatment or other patient management decisions.  A negative result may occur with improper specimen collection / handling, submission of specimen other than nasopharyngeal swab, presence of viral mutation(s) within the areas targeted by this assay, and inadequate number of viral copies (<250 copies / mL). A negative result must be combined with clinical observations, patient history, and epidemiological information.  Fact Sheet for Patients:   RoadLapTop.co.za  Fact Sheet for Healthcare Providers: http://kim-miller.com/  This test is not yet approved or  cleared by the [place] FDA and has been authorized for detection and/or diagnosis of SARS-CoV-2 by FDA under an Emergency Use Authorization (EUA).  This EUA will remain in effect (meaning this test can be used) for the duration of the COVID-19 declaration under Section 564(b)(1) of the Act, 21 U.S.C. section 360bbb-3(b)(1), unless the authorization is terminated or revoked sooner.  Performed at [facility], [address], [place], [place] [address]       Studies:  DG Chest Portable 1 View  Result Date: 09/13/2022 CLINICAL DATA:  Weakness EXAM: PORTABLE CHEST 1 VIEW COMPARISON:  07/28/2022 FINDINGS: Stable heart size status post sternotomy, CABG, cardiac valve replacement, and left atrial appendage clipping. Aortic atherosclerosis. No focal airspace consolidation, pleural effusion, or pneumothorax. IMPRESSION: No active disease. Electronically Signed   By: Duanne Guess D.O.   On: 09/13/2022 12:08   CT HEAD WO CONTRAST ( )  Result Date: 09/13/2022 CLINICAL DATA:  82 year old male with weakness and difficulty swallowing. EXAM: CT HEAD WITHOUT CONTRAST TECHNIQUE: Contiguous axial images were obtained from the base of the skull through the  vertex without intravenous contrast. RADIATION DOSE REDUCTION: This exam was performed according to the departmental dose-optimization program which includes automated exposure control, adjustment of the mA and/or kV according to patient size and/or use of iterative reconstruction technique. COMPARISON:  None Available. FINDINGS: Brain: Cerebral volume is within normal limits for age. No midline shift, ventriculomegaly, mass effect, evidence of mass lesion, intracranial hemorrhage or evidence of cortically based acute infarction. Gray-white differentiation is within normal limits for age. No encephalomalacia is identified. Vascular: No suspicious intracranial vascular hyperdensity. Calcified atherosclerosis at the skull base. Skull: Intact, negative. Sinuses/Orbits: Visualized paranasal sinuses and mastoids are well aerated. Mild ethmoid and right maxillary mucosal thickening. Other: No acute orbit or scalp soft tissue finding. IMPRESSION: Normal for age noncontrast CT appearance of the Brain. Electronically Signed   By: Odessa Fleming M.D.   On: 09/13/2022 11:47    Assessment: 82 y.o. 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 -Worsening anemia and severe neutropenia could be related to significant B12 deficiency, I will check a B12 and methylmalonic acid level today, more start loading B12 injections tomorrow -I will also check reticulocyte count, iron study, and folate level.  Previously multiple myeloma  panel was negative in April 2024 -If his B12 level is normal and above lab also unremarkable, then I will obtain a bone marrow biopsy to rule out MDS and other primary bone marrow disease. -I have reviewed his peripheral smear with our pathologist today, there is no blasts on his peripheral smear. -His primary hematologist Dr. Truett Perna will follow-up with him in the hospital   Malachy Mood, MD 09/13/2022  5:39 PM

## 2022-09-13 NOTE — ED Triage Notes (Signed)
Pt is coming from [facility]. Pt called out today complaining of generalized weakness, and an earache that has been increasing in pain. Pt has hx of heart valve infection, was being seen here for it, currently taking abx for treatment. Denies N/V/D, or fever.

## 2022-09-13 NOTE — ED Notes (Signed)
Pt had 2 incontinent episodes back to back. Pt sts he's continent and ambulates w/ cane at baseline. 2 large BM noted, BM very loose. This tech assisted pt with personal hygiene. Pt cleaned and placed in clean brief.

## 2022-09-13 NOTE — H&P (Signed)
History and Physical   [facility] [place] [facility] Admission History and Physical [employer], D.O.    Patient Name: [name] MR#: [id number] Date of Birth: [birth year] Date of Admission: 09/13/2022  Referring MD/NP/PA: Dr. Karene Fry Primary Care Physician: Caesar Bookman, NP  Chief Complaint:  Chief Complaint  Patient presents with   Weakness    HPI: [name] is a 82 y.o. 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 Presents to the emergency department for evaluation of weakness.    Patient was in a usual state of health until past 5 days he reports progressively worsening weakness decreased appetite and p.o. intake dyspnea on exertion as well and has constipation.  Last bowel movement was 5 days ago and refractory to MiraLAX.  He is passing gas and does not complain of any abdominal pain..  Did have 2 large loose bowel movements in the emergency department.  He also reports some dry cracked lips and irritation of the lateral corners of his mouth  Patient lives at [facility] independent living  Patient denies fevers/chills, weakness, dizziness, chest pain, shortness of breath, N/V/C/D, abdominal pain, dysuria/frequency, changes in mental status.    Otherwise there has been no change in status. Patient has been taking medication as prescribed and there has been no recent change in medication or diet.  No recent antibiotics.  There has been no recent illness, hospitalizations, travel or sick contacts.    EMS/ED Course: Patient received lactated Ringer's. Medical admission has been requested for further management of leukopenia, neutropenia, anemia.  Review of Systems:   CONSTITUTIONAL: Positive fatigue and weakness.  Decreased appetite and p.o. intake no fever/chills, weight gain/loss, headache. EYES: No blurry or double vision. ENT: No tinnitus, postnasal drip, redness or soreness of the oropharynx. RESPIRATORY: No cough, dyspnea, wheeze.  No hemoptysis.  CARDIOVASCULAR: No chest pain, palpitations, syncope, orthopnea. No lower extremity edema.  GASTROINTESTINAL: Positive constipation.  No nausea, vomiting, abdominal pain, diarrhea.  No hematemesis, melena or hematochezia. GENITOURINARY: No dysuria, frequency, hematuria. ENDOCRINE: No polyuria or nocturia. No heat or cold intolerance. HEMATOLOGY: No anemia, bruising, bleeding. INTEGUMENTARY: No rashes, ulcers, lesions. MUSCULOSKELETAL: No arthritis, gout. NEUROLOGIC: No numbness, tingling, ataxia, seizure-type activity, weakness. PSYCHIATRIC: No anxiety, depression, insomnia.   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    HFrEF (heart failure with reduced ejection fraction) (HCC)    Hypertension    Moderate mitral regurgitation    Severe aortic stenosis     Past Surgical History:  Procedure Laterality Date   AORTIC VALVE REPLACEMENT N/A 07/24/2017   Procedure: AORTIC VALVE REPLACEMENT (AVR) using 23mm Inspiris Aortic Valve;  Surgeon: Alleen Borne, MD;  Location: [facility] OR;  Service: Open Heart Surgery;  Laterality: N/A;   CLIPPING OF ATRIAL APPENDAGE N/A 07/24/2017   Procedure: CLIPPING OF ATRIAL APPENDAGE using a 50mm Atricure clip;  Surgeon: Alleen Borne, MD;  Location: [facility] OR;  Service: Open Heart Surgery;  Laterality: N/A;   CORONARY ARTERY BYPASS GRAFT N/A 07/24/2017   Procedure: CORONARY ARTERY BYPASS  GRAFTING (CABG) times 3 using the left greater saphenous vein harvested endoscopically and left internal mammary artery.;  Surgeon: Alleen Borne, MD;   Location: [facility] OR;  Service: Open Heart Surgery;  Laterality: N/A;   EYE SURGERY Bilateral    lens replacements for cataracts   HERNIA REPAIR     INGUINAL HERNIA REPAIR Right 05/06/2015   Procedure: LAPAROSCOPIC RIGHT INGUINAL HERNIA WITH MESH;  Surgeon: Abigail Miyamoto, MD;  Location: [facility] ORS;  Service: General;  Laterality: Right;   INSERTION OF MESH Right 05/06/2015   Procedure: INSERTION OF MESH;  Surgeon: Abigail Miyamoto, MD;  Location: [facility] ORS;  Service: General;  Laterality: Right;   RIGHT/LEFT HEART CATH AND CORONARY ANGIOGRAPHY N/A 07/10/2017   Procedure: RIGHT/LEFT HEART CATH AND CORONARY ANGIOGRAPHY;  Surgeon: Elder Negus, MD;  Location: [facility] INVASIVE CV LAB;  Service: Cardiovascular;  Laterality: N/A;   TEE WITHOUT CARDIOVERSION N/A 07/11/2017   Procedure: TRANSESOPHAGEAL ECHOCARDIOGRAM (TEE);  Surgeon: Elder Negus, MD;  Location: [facility] ENDOSCOPY;  Service: Cardiovascular;  Laterality: N/A;   TEE WITHOUT CARDIOVERSION N/A 07/24/2017   Procedure: TRANSESOPHAGEAL ECHOCARDIOGRAM (TEE);  Surgeon: Alleen Borne, MD;  Location: [facility] OR;  Service: Open Heart Surgery;  Laterality: N/A;   TEE WITHOUT CARDIOVERSION N/A 08/01/2022   Procedure: TRANSESOPHAGEAL ECHOCARDIOGRAM;  Surgeon: Lewayne Bunting, MD;  Location: [facility] INVASIVE CV LAB;  Service: Cardiovascular;  Laterality: N/A;   TOTAL KNEE ARTHROPLASTY Right 08/07/2019   Procedure: TOTAL KNEE ARTHROPLASTY;  Surgeon: Ollen Gross, MD;  Location: [facility] ORS;  Service: Orthopedics;  Laterality: Right;   ULTRASOUND GUIDANCE FOR VASCULAR ACCESS  07/10/2017   Procedure: Ultrasound Guidance For Vascular Access;  Surgeon: Elder Negus, MD;  Location: [facility] INVASIVE CV LAB;  Service: Cardiovascular;;     reports that he quit smoking about 51 years ago. His smoking use included cigarettes. He started smoking about 63 years ago. He has a 12 pack-year smoking history. He has never used smokeless tobacco. He reports current alcohol use. He reports  current drug use. Drug: Marijuana.  Allergies  Allergen Reactions   Sulfamethoxazole-Trimethoprim Rash    Family History  Problem Relation Age of Onset   Stroke Mother    Heart attack Father     Prior to Admission medications   Medication Sig Start Date End Date Taking? Authorizing Provider  acetaminophen (TYLENOL) 500 MG tablet Take 2 tablets (1,000 mg total) by mouth every 6 (six) hours as needed for mild pain or fever. 07/29/17   Barrett, Erin R, PA-C  amoxicillin (AMOXIL) 500 MG tablet Take 2 tablets (1,000 mg total) by mouth 3 (three) times daily. 08/30/22   Odette Fraction, MD  aspirin EC 81 MG tablet Take 81 mg by mouth daily. Swallow whole.    [provider]  atorvastatin (LIPITOR) 40 MG tablet Take 1 tablet (40 mg total) by mouth daily. 08/04/22   Glade Lloyd, MD  Cetirizine HCl (ZYRTEC ALLERGY) 10 MG CAPS Take 10 mg by mouth daily as needed (as needed for allergies). 05/09/22   [provider]  [employer]S VITAMIN C 500 MG tablet TAKE 1 TABLET (500 MG TOTAL) BY MOUTH DAILY. 08/23/22   Ngetich, Dinah C, NP  diclofenac Sodium (VOLTAREN) 1 % GEL Apply 2 g topically 4 (four) times daily. To low back 06/28/22   Leroy Sea, MD  metoprolol succinate (TOPROL-XL) 25 MG 24 hr tablet Take 0.5 tablets (12.5 mg total) by mouth daily. Take with or immediately following a meal. 08/25/22 11/23/22  Patwardhan,  Manish J, MD  Multiple Vitamin (MULTIVITAMIN ADULT PO) Take 1 tablet by mouth daily.    [provider]  MYRBETRIQ 25 MG TB24 tablet Take 25 mg by mouth daily. 05/31/22   [provider]  Omega-3 Fatty Acids (FISH OIL) 1000 MG CAPS Take 2 capsules by mouth daily.    [provider]  polyethylene glycol (MIRALAX) 17 g packet Take 17 g by mouth daily. 09/12/22 10/12/22  Fletcher Anon, NP  silodosin (RAPAFLO) 8 MG CAPS capsule Take 8 mg by mouth daily. 11/15/21   [provider]  torsemide (DEMADEX) 10 MG tablet TAKE 1 TABLET BY MOUTH TWICE A DAY  09/01/22   Elder Negus, MD    Physical Exam: Vitals:   09/13/22 1100 09/13/22 1400 09/13/22 1422 09/13/22 1500  BP: 120/73 126/83  (!) 118/50  Pulse: 78 74  62  Resp: 16 16  17   Temp:   98.5 F (36.9 C)   TempSrc:   Oral   SpO2: 93% 100%  93%  Weight:      Height:        GENERAL: 82 y.o.-year-old male patient, well-developed, thin and frail lying in the bed in no acute distress.  Pleasant and cooperative.   HEENT: Head atraumatic, normocephalic. Pupils equal. Mucus membranes moist. NECK: Supple. No JVD. CHEST: Normal breath sounds bilaterally. No wheezing, rales, rhonchi or crackles. No use of accessory muscles of respiration.  No reproducible chest wall tenderness.  CARDIOVASCULAR: S1, S2 normal. No murmurs, rubs, or gallops. Cap refill <2 seconds. Pulses intact distally.  ABDOMEN: Soft, nondistended, nontender. No rebound, guarding, rigidity. Normoactive bowel sounds present in all four quadrants.  Rectal exam done by emergency department physician was negative for fecal occult EXTREMITIES: No pedal edema, cyanosis, or clubbing. No calf tenderness or Homan's sign.  NEUROLOGIC: The patient is alert and oriented x 3. Cranial nerves II through XII are grossly intact with no focal sensorimotor deficit. PSYCHIATRIC:  Normal affect, mood, thought content. SKIN: Warm, dry, and intact without obvious rash, lesion, or ulcer.  With the exception of mucosal erosions along the tongue and some cracking at the lateral border of his mouth    Labs on Admission:  CBC: Recent Labs  Lab 09/13/22 1153  WBC 0.8*  NEUTROABS 0.1*  HGB 8.0*  HCT 25.3*  MCV 91.7  PLT 248   Basic Metabolic Panel: Recent Labs  Lab 09/13/22 1153  NA 129*  K 3.4*  CL 94*  CO2 25  GLUCOSE 112*  BUN 14  CREATININE 0.82  CALCIUM 8.4*  MG 1.9   GFR: Estimated Creatinine Clearance: 73.5 mL/min (by C-G formula based on SCr of 0.82 mg/dL). Liver Function Tests: Recent Labs  Lab 09/13/22 1153  AST  77*  ALT 52*  ALKPHOS 45  BILITOT 0.6  PROT 5.9*  ALBUMIN 2.7*   No results for input(s): "LIPASE", "AMYLASE" in the last 168 hours. No results for input(s): "AMMONIA" in the last 168 hours. Coagulation Profile: No results for input(s): "INR", "PROTIME" in the last 168 hours. Cardiac Enzymes: No results for input(s): "CKTOTAL", "CKMB", "CKMBINDEX", "TROPONINI" in the last 168 hours. BNP (last 3 results) No results for input(s): "PROBNP" in the last 8760 hours. HbA1C: No results for input(s): "HGBA1C" in the last 72 hours. CBG: No results for input(s): "GLUCAP" in the last 168 hours. Lipid Profile: No results for input(s): "CHOL", "HDL", "LDLCALC", "TRIG", "CHOLHDL", "LDLDIRECT" in the last 72 hours. Thyroid Function Tests: Recent Labs    09/13/22  1150  TSH 1.124  FREET4 1.19*   Anemia Panel: No results for input(s): "VITAMINB12", "FOLATE", "FERRITIN", "TIBC", "IRON", "RETICCTPCT" in the last 72 hours. Urine analysis:    Component Value Date/Time   COLORURINE YELLOW 07/28/2022 0556   APPEARANCEUR CLOUDY (A) 07/28/2022 0556   LABSPEC 1.016 07/28/2022 0556   PHURINE 5.0 07/28/2022 0556   GLUCOSEU NEGATIVE 07/28/2022 0556   HGBUR NEGATIVE 07/28/2022 0556   BILIRUBINUR NEGATIVE 07/28/2022 0556   KETONESUR 5 (A) 07/28/2022 0556   PROTEINUR NEGATIVE 07/28/2022 0556   NITRITE POSITIVE (A) 07/28/2022 0556   LEUKOCYTESUR LARGE (A) 07/28/2022 0556   Sepsis Labs: @LABRCNTIP (procalcitonin:4,lacticidven:4) ) Recent Results (from the past 240 hour(s))  SARS Coronavirus 2 by RT PCR (hospital order, performed in [facility] hospital lab) *cepheid single result test* Anterior Nasal Swab     Status: None   Collection Time: 09/13/22 11:50 AM   Specimen: Anterior Nasal Swab  Result Value Ref Range Status   SARS Coronavirus 2 by RT PCR NEGATIVE NEGATIVE Final    Comment: (NOTE) SARS-CoV-2 target nucleic acids are NOT DETECTED.  The SARS-CoV-2 RNA is generally detectable in upper  and lower respiratory specimens during the acute phase of infection. The lowest concentration of SARS-CoV-2 viral copies this assay can detect is 250 copies / mL. A negative result does not preclude SARS-CoV-2 infection and should not be used as the sole basis for treatment or other patient management decisions.  A negative result may occur with improper specimen collection / handling, submission of specimen other than nasopharyngeal swab, presence of viral mutation(s) within the areas targeted by this assay, and inadequate number of viral copies (<250 copies / mL). A negative result must be combined with clinical observations, patient history, and epidemiological information.  Fact Sheet for Patients:   RoadLapTop.co.za  Fact Sheet for Healthcare Providers: http://kim-miller.com/  This test is not yet approved or  cleared by the [place] FDA and has been authorized for detection and/or diagnosis of SARS-CoV-2 by FDA under an Emergency Use Authorization (EUA).  This EUA will remain in effect (meaning this test can be used) for the duration of the COVID-19 declaration under Section 564(b)(1) of the Act, 21 U.S.C. section 360bbb-3(b)(1), unless the authorization is terminated or revoked sooner.  Performed at [facility], [address] [place], [place] [address]      Radiological Exams on Admission: DG Chest Portable 1 View  Result Date: 09/13/2022 CLINICAL DATA:  Weakness EXAM: PORTABLE CHEST 1 VIEW COMPARISON:  07/28/2022 FINDINGS: Stable heart size status post sternotomy, CABG, cardiac valve replacement, and left atrial appendage clipping. Aortic atherosclerosis. No focal airspace consolidation, pleural effusion, or pneumothorax. IMPRESSION: No active disease. Electronically Signed   By: Duanne Guess D.O.   On: 09/13/2022 12:08   CT HEAD WO CONTRAST ( )  Result Date: 09/13/2022 CLINICAL DATA:  82 year old  male with weakness and difficulty swallowing. EXAM: CT HEAD WITHOUT CONTRAST TECHNIQUE: Contiguous axial images were obtained from the base of the skull through the vertex without intravenous contrast. RADIATION DOSE REDUCTION: This exam was performed according to the departmental dose-optimization program which includes automated exposure control, adjustment of the mA and/or kV according to patient size and/or use of iterative reconstruction technique. COMPARISON:  None Available. FINDINGS: Brain: Cerebral volume is within normal limits for age. No midline shift, ventriculomegaly, mass effect, evidence of mass lesion, intracranial hemorrhage or evidence of cortically based acute infarction. Gray-white differentiation is within normal limits for age. No encephalomalacia is identified. Vascular:  No suspicious intracranial vascular hyperdensity. Calcified atherosclerosis at the skull base. Skull: Intact, negative. Sinuses/Orbits: Visualized paranasal sinuses and mastoids are well aerated. Mild ethmoid and right maxillary mucosal thickening. Other: No acute orbit or scalp soft tissue finding. IMPRESSION: Normal for age noncontrast CT appearance of the Brain. Electronically Signed   By: Odessa Fleming M.D.   On: 09/13/2022 11:47    EKG: Normal sinus rhythm at 91 bpm with normal axis right bundle branch block and nonspecific ST-T wave changes.   Assessment/Plan  This is a 82 y.o. 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 Leukopenia, neutropenia, anemia -Will admit for workup - Hematology has already seen the patient and recommendations appreciated - Will check B12, MMA, folate, urine studies, reticulocyte count although multiple myeloma panel was negative in April of this year  #. Weakness likely secondary to above -Fall  precautions  #.  CAD - Continue aspirin  #. History of history of prosthetic aortic valve endocarditis - Continue amoxicillin  #. History of hyperlipidemia - Continue atorvastatin  #. History of HTN - Continue metoprolol  #. History of CHF - Continue to monitor  Admission status: Observation IV Fluids: Lactated Ringer's Diet/Nutrition: Heart healthy Consults called: Hematology DVT Px: Lovenox, SCDs and early ambulation. Code Status: Full Code  Disposition Plan: To home in 1-2 days  All the records are reviewed and case discussed with ED provider. Management plans discussed with the patient and/or family who express understanding and agree with plan of care.    D.O. on 09/13/2022 at 4:46 PM CC: Primary care physician; Caesar Bookman, NP   09/13/2022, 4:46 PM

## 2022-09-14 ENCOUNTER — Other Ambulatory Visit: Payer: Medicare PPO

## 2022-09-14 ENCOUNTER — Encounter (HOSPITAL_COMMUNITY): Payer: Self-pay | Admitting: Family Medicine

## 2022-09-14 DIAGNOSIS — E871 Hypo-osmolality and hyponatremia: Secondary | ICD-10-CM | POA: Diagnosis present

## 2022-09-14 DIAGNOSIS — D72819 Decreased white blood cell count, unspecified: Secondary | ICD-10-CM | POA: Diagnosis present

## 2022-09-14 DIAGNOSIS — Z951 Presence of aortocoronary bypass graft: Secondary | ICD-10-CM | POA: Diagnosis not present

## 2022-09-14 DIAGNOSIS — Z79899 Other long term (current) drug therapy: Secondary | ICD-10-CM | POA: Diagnosis not present

## 2022-09-14 DIAGNOSIS — M109 Gout, unspecified: Secondary | ICD-10-CM | POA: Diagnosis present

## 2022-09-14 DIAGNOSIS — D709 Neutropenia, unspecified: Secondary | ICD-10-CM | POA: Diagnosis not present

## 2022-09-14 DIAGNOSIS — Z1152 Encounter for screening for COVID-19: Secondary | ICD-10-CM | POA: Diagnosis not present

## 2022-09-14 DIAGNOSIS — E876 Hypokalemia: Secondary | ICD-10-CM | POA: Diagnosis present

## 2022-09-14 DIAGNOSIS — D638 Anemia in other chronic diseases classified elsewhere: Secondary | ICD-10-CM | POA: Diagnosis present

## 2022-09-14 DIAGNOSIS — K5900 Constipation, unspecified: Secondary | ICD-10-CM | POA: Diagnosis present

## 2022-09-14 DIAGNOSIS — K140 Glossitis: Secondary | ICD-10-CM | POA: Diagnosis present

## 2022-09-14 DIAGNOSIS — I5022 Chronic systolic (congestive) heart failure: Secondary | ICD-10-CM | POA: Diagnosis present

## 2022-09-14 DIAGNOSIS — I080 Rheumatic disorders of both mitral and aortic valves: Secondary | ICD-10-CM | POA: Diagnosis present

## 2022-09-14 DIAGNOSIS — I110 Hypertensive heart disease with heart failure: Secondary | ICD-10-CM | POA: Diagnosis present

## 2022-09-14 DIAGNOSIS — R54 Age-related physical debility: Secondary | ICD-10-CM | POA: Diagnosis present

## 2022-09-14 DIAGNOSIS — E538 Deficiency of other specified B group vitamins: Secondary | ICD-10-CM | POA: Diagnosis present

## 2022-09-14 DIAGNOSIS — Z7982 Long term (current) use of aspirin: Secondary | ICD-10-CM | POA: Diagnosis not present

## 2022-09-14 DIAGNOSIS — Z881 Allergy status to other antibiotic agents status: Secondary | ICD-10-CM | POA: Diagnosis not present

## 2022-09-14 DIAGNOSIS — K121 Other forms of stomatitis: Secondary | ICD-10-CM | POA: Diagnosis present

## 2022-09-14 DIAGNOSIS — I2510 Atherosclerotic heart disease of native coronary artery without angina pectoris: Secondary | ICD-10-CM | POA: Diagnosis present

## 2022-09-14 DIAGNOSIS — D61818 Other pancytopenia: Secondary | ICD-10-CM | POA: Diagnosis present

## 2022-09-14 DIAGNOSIS — D708 Other neutropenia: Secondary | ICD-10-CM | POA: Diagnosis not present

## 2022-09-14 DIAGNOSIS — E785 Hyperlipidemia, unspecified: Secondary | ICD-10-CM | POA: Diagnosis present

## 2022-09-14 DIAGNOSIS — Z87891 Personal history of nicotine dependence: Secondary | ICD-10-CM | POA: Diagnosis not present

## 2022-09-14 DIAGNOSIS — D509 Iron deficiency anemia, unspecified: Secondary | ICD-10-CM | POA: Diagnosis present

## 2022-09-14 DIAGNOSIS — Z8249 Family history of ischemic heart disease and other diseases of the circulatory system: Secondary | ICD-10-CM | POA: Diagnosis not present

## 2022-09-14 DIAGNOSIS — Z96651 Presence of right artificial knee joint: Secondary | ICD-10-CM | POA: Diagnosis present

## 2022-09-14 MED ORDER — POTASSIUM CHLORIDE CRYS ER 20 MEQ PO TBCR
40.0000 meq | EXTENDED_RELEASE_TABLET | Freq: Once | ORAL | Status: AC
  Administered 2022-09-14: 40 meq via ORAL
  Filled 2022-09-14: qty 2

## 2022-09-14 MED ORDER — HYDROCODONE-ACETAMINOPHEN 5-325 MG PO TABS: 1.0000 | ORAL_TABLET | Freq: Four times a day (QID) | ORAL | Status: DC | PRN

## 2022-09-14 MED ORDER — CEFADROXIL 500 MG PO CAPS
1000.0000 mg | ORAL_CAPSULE | Freq: Two times a day (BID) | ORAL | Status: DC
  Administered 2022-09-14 – 2022-09-17 (×6): 1000 mg via ORAL
  Filled 2022-09-14 (×6): qty 2

## 2022-09-14 MED ORDER — CEFADROXIL 500 MG PO CAPS
500.0000 mg | ORAL_CAPSULE | Freq: Two times a day (BID) | ORAL | Status: DC
  Administered 2022-09-14: 500 mg via ORAL
  Filled 2022-09-14: qty 1

## 2022-09-14 MED ORDER — WITCH HAZEL-GLYCERIN EX PADS
MEDICATED_PAD | CUTANEOUS | Status: DC | PRN
  Filled 2022-09-14 (×2): qty 100

## 2022-09-14 NOTE — Progress Notes (Signed)
PROGRESS NOTE  [name] WUJ:[id number] DOB: 16-Mar-1940 DOA: 09/13/2022 PCP: Caesar Bookman, NP  HPI/Recap of past 24 hours: [name] is a 82 y.o. 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 Presents to the ED for evaluation of progressive weakness, found to have leukopenia, with anemia.  Hematologist consulted, patient admitted for further management.    Today, pt denies any new complaints. Still reports generalized weakness. Discussed with son at bedside.    Assessment/Plan: Principal Problem:   Leukopenia Active Problems:   Weakness   Leukopenia Unknown etiology WBC 0.9 Heme-onc on board, possibly related to recent amoxicillin use, will hold ID consulted for alternative antibiotics, recommend cefadroxil 500 mg twice daily If no improvement, bone marrow biopsy ID consulted Daily CBC  Normocytic anemia/anemia of chronic disease Hemoglobin dropped to 7.9, baseline between 8-9 No evidence of bleeding Daily CBC   Weakness likely secondary to above Fall precautions  Hypokalemia Replace as needed  Mild transaminitis Elevated AST, ALT, T. bili WNL Unknown etiology Daily CMP  History of prosthetic aortic valve endocarditis Diagnosed August 05, 2022 initially treated with Rocephin X 6 weeks, switched to PO amoxicillin indefinitely on 8/4, currently held on 8/7 ID recommends switching to cefadroxil 500 mg twice daily  Chronic systolic HF Appears euvolemic BNP 896, better than previous Echo with EF of 35 to 40%, global hypokinesis, left ventricular diastolic function could not be evaluated Continue torsemide for now Monitor closely  CAD Continue aspirin   Hyperlipidemia Held  atorvastatin due to mild transaminitis   HTN Continue metoprolol        Estimated body mass index is 21.62 kg/m as calculated from the following:   Height as of this encounter: 5\' 11"  (1.803 m).   Weight as of this encounter: 70.3 kg.     Code Status: Full  Family Communication: Discussed with son at bedside  Disposition Plan: Status is: Observation The patient will require care spanning > 2 midnights and should be moved to inpatient because: Level of care      Consultants: Heme-onc ID  Procedures: None  Antimicrobials: Cefadroxil  DVT prophylaxis: Lovenox   Objective: Vitals:   09/13/22 2113 09/14/22 0136 09/14/22 0509 09/14/22 0908  BP: 124/70 115/74 118/64 112/68  Pulse: 79 83 73 82  Resp: 16 16 16 17   Temp: 99 F (37.2 C) 97.7 F (36.5 C) 97.7 F (36.5 C)   TempSrc: Oral Oral Oral   SpO2: 99% 94% 95% 96%  Weight:      Height:        Intake/Output Summary (Last 24 hours) at 09/14/2022 1317 Last data filed at 09/14/2022 1200 Gross per 24 hour  Intake 240 ml  Output 560 ml  Net -320 ml   Filed Weights   09/13/22 0955 09/13/22 2108  Weight: 73.5 kg 70.3 kg    Exam: General: NAD  Cardiovascular: S1, S2 present Respiratory: CTAB Abdomen: Soft, nontender, nondistended, bowel sounds present Musculoskeletal: No bilateral pedal edema noted Skin: Normal Psychiatry: Normal mood     Data Reviewed: CBC: Recent Labs  Lab 09/13/22 1153 09/14/22 0515  WBC 0.8* 0.9*  NEUTROABS 0.1*  --   HGB 8.0* 7.9*  HCT 25.3* 25.0*  MCV 91.7 91.2  PLT 248 252   Basic  Metabolic Panel: Recent Labs  Lab 09/13/22 1153 09/14/22 0515  NA 129* 128*  K 3.4* 3.1*  CL 94* 92*  CO2 25 25  GLUCOSE 112* 100*  BUN 14 12  CREATININE 0.82 0.82  CALCIUM 8.4* 8.1*  MG 1.9  --    GFR: Estimated Creatinine Clearance: 70.3 mL/min (by C-G formula based on SCr of 0.82 mg/dL). Liver Function Tests: Recent Labs  Lab 09/13/22 1153 09/14/22 0515  AST 77* 84*   ALT 52* 67*  ALKPHOS 45 42  BILITOT 0.6 0.7  PROT 5.9* 5.4*  ALBUMIN 2.7* 2.4*   No results for input(s): "LIPASE", "AMYLASE" in the last 168 hours. No results for input(s): "AMMONIA" in the last 168 hours. Coagulation Profile: No results for input(s): "INR", "PROTIME" in the last 168 hours. Cardiac Enzymes: No results for input(s): "CKTOTAL", "CKMB", "CKMBINDEX", "TROPONINI" in the last 168 hours. BNP (last 3 results) No results for input(s): "PROBNP" in the last 8760 hours. HbA1C: No results for input(s): "HGBA1C" in the last 72 hours. CBG: No results for input(s): "GLUCAP" in the last 168 hours. Lipid Profile: No results for input(s): "CHOL", "HDL", "LDLCALC", "TRIG", "CHOLHDL", "LDLDIRECT" in the last 72 hours. Thyroid Function Tests: Recent Labs    09/13/22 1150  TSH 1.124  FREET4 1.19*   Anemia Panel: Recent Labs    09/13/22 2012 09/13/22 2014  VITAMINB12 929*  --   FOLATE 26.8  --   FERRITIN 538*  --   TIBC 179*  --   IRON 16*  --   RETICCTPCT  --  1.2   Urine analysis:    Component Value Date/Time   COLORURINE YELLOW 07/28/2022 0556   APPEARANCEUR CLOUDY (A) 07/28/2022 0556   LABSPEC 1.016 07/28/2022 0556   PHURINE 5.0 07/28/2022 0556   GLUCOSEU NEGATIVE 07/28/2022 0556   HGBUR NEGATIVE 07/28/2022 0556   BILIRUBINUR NEGATIVE 07/28/2022 0556   KETONESUR 5 (A) 07/28/2022 0556   PROTEINUR NEGATIVE 07/28/2022 0556   NITRITE POSITIVE (A) 07/28/2022 0556   LEUKOCYTESUR LARGE (A) 07/28/2022 0556   Sepsis Labs: @LABRCNTIP (procalcitonin:4,lacticidven:4)  ) Recent Results (from the past 240 hour(s))  SARS Coronavirus 2 by RT PCR (hospital order, performed in [facility] hospital lab) *cepheid single result test* Anterior Nasal Swab     Status: None   Collection Time: 09/13/22 11:50 AM   Specimen: Anterior Nasal Swab  Result Value Ref Range Status   SARS Coronavirus 2 by RT PCR NEGATIVE NEGATIVE Final    Comment: (NOTE) SARS-CoV-2 target nucleic acids  are NOT DETECTED.  The SARS-CoV-2 RNA is generally detectable in upper and lower respiratory specimens during the acute phase of infection. The lowest concentration of SARS-CoV-2 viral copies this assay can detect is 250 copies / mL. A negative result does not preclude SARS-CoV-2 infection and should not be used as the sole basis for treatment or other patient management decisions.  A negative result may occur with improper specimen collection / handling, submission of specimen other than nasopharyngeal swab, presence of viral mutation(s) within the areas targeted by this assay, and inadequate number of viral copies (<250 copies / mL). A negative result must be combined with clinical observations, patient history, and epidemiological information.  Fact Sheet for Patients:   RoadLapTop.co.za  Fact Sheet for Healthcare Providers: http://kim-miller.com/  This test is not yet approved or  cleared by the [place] FDA and has been authorized for detection and/or diagnosis of SARS-CoV-2 by FDA under an Emergency Use Authorization (EUA).  This EUA will  remain in effect (meaning this test can be used) for the duration of the COVID-19 declaration under Section 564(b)(1) of the Act, 21 U.S.C. section 360bbb-3(b)(1), unless the authorization is terminated or revoked sooner.  Performed at [facility], [address], [place], [place] [address]       Studies: No results found.  Scheduled Meds:  ascorbic acid  500 mg Oral Daily   aspirin EC  81 mg Oral Daily   atorvastatin  40 mg Oral Daily   diclofenac Sodium  2 g Topical QID   enoxaparin (LOVENOX) injection  40 mg Subcutaneous Q24H   loratadine  10 mg Oral Daily   metoprolol succinate  12.5 mg Oral Daily   mirabegron ER  25 mg Oral Daily   multivitamin with minerals  1 tablet Oral Daily   omega-3 acid ethyl esters  2 capsule Oral Daily   polyethylene glycol  17 g  Oral Daily   sodium chloride flush  3 mL Intravenous Q12H   tamsulosin  0.4 mg Oral Daily   torsemide  10 mg Oral BID    Continuous Infusions:  lactated ringers 50 mL/hr at 09/13/22 2308     LOS: 0 days     Briant Cedar, MD [facility]  If 7PM-7AM, please contact night-coverage [email or web address] 09/14/2022, 1:17 PM

## 2022-09-14 NOTE — Consult Note (Signed)
[facility]    Date of Admission:  09/13/2022  Total days of inpatient antibiotics 2        Reason for Consult: Neutropenia on suppressive amoxicillin   Principal Problem:   Leukopenia Active Problems:   Weakness   Assessment: 82 YM with multiple comorbidities including CAD, CHF, moderate MR, severe aortic stenosis status post CABG, clipping of aortic appendage and prosthetic aortic valve replacement complicated by strep sanguinous bacteremia prosthetic AV endocarditis treated with ceftriaxone x 6 weeks EOT 8/3 and started on amoxicillin 1 g p.o. 3 times daily on 8/4 for chronic suppression, followed by Dr. 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 suppression #B12 deficiency #History of anemia followed by Truett Perna heme-onc - Patient with progressive weakness found to have leukopenia and anemia.  ANC 100, hemoglobin 7.9, mild transaminitis. - Oncology engaged, patient followed by Dr. Truett Perna outpatient etiology of neutropenia is unclear.  Noted that B12 deficiency does not appear to be causative.  Suppressive amoxicillin was stopped and if counts do not recover then possible marrow biopsy. -ANC 2300 on 7/22, 6400 on 7/1, 9500 on 6/20.  Appears that has been downtrending for a while. Recommendations:  -Start cefadroxil 1gm twice daily. - Patient has been on amoxicillin since 8/4.  Nothing that is a contributing factor as ANC was downtrending since July.  Agree with bone marrow biopsy. - Follow blood cultures.  #Brain fog -I spoke with son at bedside.  He states that brain fog occurs when he progressed forgetful setting of acute illness./Hospitalization.   #Oral ulcer on tongue - Patient's is able to eat with it, not  really painful. - Will swab for HSV although I suspect this anophthalmos ulcer.  Hold off on Valtrex for right now. Discussed the plan with son and pt who are agreeable.  Microbiology:   Antibiotics: Amoxicillin 8/4-present  Cultures: Blood 8/4-pending.  HPI: [name] is a 82 y.o. male history of CAD, CHF, and severe AS status post CABG and prosthetic aortic valve replacement followed by Sanguinous bacteremia with prosthetic valve endocarditis on 07/28/2022 treated with IV ceftriaxone x 6 weeks transition to amoxicillin for chronic suppression on 8//24.  Presented to the ED because ANC of 100(2300 on 7/22, 9500 on 6/20), mildly elevated liver enzymes.  He was admitted usual state of health 5 days ago when he again progressively weaker, decreased appetite.  Admitted for leukopenia, neutropenia and anemia.  Hematology/Onc engaged noted previously low B12 level and that patient had not been on his B12.  On my lower biopsy if neutrophil count does not recover off of amoxicillin.  Noted that B12 deficient does not appear causative.  Amoxicillin was stopped.  ID engaged for suppressive antibiotic recommendations.   Review of Systems: Review of Systems  All other systems reviewed and are negative.   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    HFrEF (heart failure with reduced ejection fraction) (HCC)    Hypertension    Moderate mitral regurgitation    Severe aortic stenosis     Social History   Tobacco Use  Smoking status: Former    Current packs/day: 0.00    Average packs/day: 1 pack/day for 12.0 years (12.0 ttl pk-yrs)    Types: Cigarettes    Start date: 20    Quit date: 46    Years since quitting: 51.6   Smokeless tobacco: Never   Tobacco comments:    quit 30-40 yrs ago  Vaping Use   Vaping status: Never Used  Substance Use  Topics   Alcohol use: Yes    Comment: 1-2 drinks   Drug use: Yes    Types: Marijuana    Family History  Problem Relation Age of Onset   Stroke Mother    Heart attack Father    Scheduled Meds:  ascorbic acid  500 mg Oral Daily   aspirin EC  81 mg Oral Daily   cefadroxil  500 mg Oral BID   diclofenac Sodium  2 g Topical QID   enoxaparin (LOVENOX) injection  40 mg Subcutaneous Q24H   loratadine  10 mg Oral Daily   metoprolol succinate  12.5 mg Oral Daily   mirabegron ER  25 mg Oral Daily   multivitamin with minerals  1 tablet Oral Daily   omega-3 acid ethyl esters  2 capsule Oral Daily   polyethylene glycol  17 g Oral Daily   sodium chloride flush  3 mL Intravenous Q12H   tamsulosin  0.4 mg Oral Daily   torsemide  10 mg Oral BID   Continuous Infusions: PRN Meds:.acetaminophen **OR** acetaminophen, bisacodyl, hydrALAZINE, HYDROcodone-acetaminophen, ondansetron **OR** ondansetron (ZOFRAN) IV, senna-docusate, traZODone Allergies  Allergen Reactions   Sulfamethoxazole-Trimethoprim Rash    OBJECTIVE: Blood pressure 112/68, pulse 82, temperature 97.7 F (36.5 C), temperature source Oral, resp. rate 17, height 5\' 11"  (1.803 m), weight 70.3 kg, SpO2 96%.  Physical Exam Constitutional:      General: He is not in acute distress.    Appearance: He is normal weight. He is not toxic-appearing.  HENT:     Head: Normocephalic and atraumatic.     Comments: Tongue ulcer    Right Ear: External ear normal.     Left Ear: External ear normal.     Nose: No congestion or rhinorrhea.     Mouth/Throat:     Mouth: Mucous membranes are moist.     Pharynx: Oropharynx is clear.  Eyes:     Extraocular Movements: Extraocular movements intact.     Conjunctiva/sclera: Conjunctivae normal.     Pupils: Pupils are equal, round, and reactive to light.  Cardiovascular:     Rate and Rhythm: Normal rate and regular rhythm.     Heart sounds: No murmur heard.    No friction rub. No gallop.  Pulmonary:      Effort: Pulmonary effort is normal.     Breath sounds: Normal breath sounds.  Abdominal:     General: Abdomen is flat. Bowel sounds are normal.     Palpations: Abdomen is soft.  Musculoskeletal:        General: No swelling.     Cervical back: Normal range of motion and neck supple.  Skin:    General: Skin is warm and dry.  Neurological:     General: No focal deficit present.     Mental Status: He is oriented to person, place, and time.  Psychiatric:        Mood and Affect: Mood normal.     Lab Results Lab Results  Component Value Date   WBC 0.9 (LL) 09/14/2022   HGB 7.9 (  L) 09/14/2022   HCT 25.0 (L) 09/14/2022   MCV 91.2 09/14/2022   PLT 252 09/14/2022    Lab Results  Component Value Date   CREATININE 0.82 09/14/2022   BUN 12 09/14/2022   NA 128 (L) 09/14/2022   K 3.1 (L) 09/14/2022   CL 92 (L) 09/14/2022   CO2 25 09/14/2022    Lab Results  Component Value Date   ALT 67 (H) 09/14/2022   AST 84 (H) 09/14/2022   ALKPHOS 42 09/14/2022   BILITOT 0.7 09/14/2022       Danelle Earthly, MD [facility] 09/14/2022, 2:46 PM   I have personally spent 84 minutes involved in face-to-face and non-face-to-face activities for this patient on the day of the visit. Professional time spent includes the following activities: Preparing to see the patient (review of tests), Obtaining and/or reviewing separately obtained history (admission/discharge record), Performing a medically appropriate examination and/or evaluation , Ordering medications/tests/procedures, referring and communicating with other health care professionals, Documenting clinical information in the EMR, Independently interpreting results (not separately reported), Communicating results to the patient/family/caregiver, Counseling and educating the patient/family/caregiver and Care coordination (not separately reported).

## 2022-09-14 NOTE — Progress Notes (Signed)
IP PROGRESS NOTE  Subjective:   Mr. Schneider was referred in April for evaluation of mild anemia.  I was concerned he could have early myelodysplasia.  The vitamin B12 level returned low and he was started on vitamin B12 replacement.  He reports starting vitamin B12 therapy, but says this was discontinued by his cardiologist several months ago. Mr. Whitelow was admitted in May with hyponatremia.  He was admitted 07/28/2022 with sepsis and multifocal pneumonia.  He was diagnosed with strep sanguinous bacteremia and aortic valve endocarditis.  He completed a course of IV antibiotics and transition to amoxicillin.  He presented to the emergency room yesterday with "weakness ".  He reported constipation.  A CBC in the emergency room found the hemoglobin at 8, MCV 91.7, platelets 248,000, and WBC 0.8 with an ANC of 0.1.  He was admitted for further evaluation of pancytopenia and supportive care.  Mr. Carmona denies fever.   Objective: Vital signs in last 24 hours: Blood pressure 118/64, pulse 73, temperature 97.7 F (36.5 C), temperature source Oral, resp. rate 16, height 5\' 11"  (1.803 m), weight 155 lb (70.3 kg), SpO2 95%.  Intake/Output from previous day: 08/06 0701 - 08/07 0700 In: -  Out: 260 [Urine:260]  Physical Exam:  HEENT: No thrush Lungs: Clear bilaterally, no respiratory distress Cardiac: Irregular Abdomen: No hepatosplenomegaly Extremities: No leg edema Lymph nodes: No cervical, supraclavicular, axillary, or inguinal nodes   Lab Results: Recent Labs    09/13/22 1153 09/14/22 0515  WBC 0.8* 0.9*  HGB 8.0* 7.9*  HCT 25.3* 25.0*  PLT 248 252   Blood smear: The platelets appear normal in number, few platelet clumps.  Scattered large and giant platelets.  Numerous ovalocytes, few cigar cells, no nucleated red cells, the polychromasia is not increased.  Decreased leukocytes.  The majority are lymphocytes and monocytes.  No neutrophils seen.  No blasts. BMET Recent Labs     09/13/22 1153 09/14/22 0515  NA 129* 128*  K 3.4* 3.1*  CL 94* 92*  CO2 25 25  GLUCOSE 112* 100*  BUN 14 12  CREATININE 0.82 0.82  CALCIUM 8.4* 8.1*    No results found for: "CEA1", "CEA", "QMV784", "CA125"  Studies/Results: DG Chest Portable 1 View  Result Date: 09/13/2022 CLINICAL DATA:  Weakness EXAM: PORTABLE CHEST 1 VIEW COMPARISON:  07/28/2022 FINDINGS: Stable heart size status post sternotomy, CABG, cardiac valve replacement, and left atrial appendage clipping. Aortic atherosclerosis. No focal airspace consolidation, pleural effusion, or pneumothorax. IMPRESSION: No active disease. Electronically Signed   By: Duanne Guess D.O.   On: 09/13/2022 12:08   CT HEAD WO CONTRAST ( )  Result Date: 09/13/2022 CLINICAL DATA:  82 year old male with weakness and difficulty swallowing. EXAM: CT HEAD WITHOUT CONTRAST TECHNIQUE: Contiguous axial images were obtained from the base of the skull through the vertex without intravenous contrast. RADIATION DOSE REDUCTION: This exam was performed according to the departmental dose-optimization program which includes automated exposure control, adjustment of the mA and/or kV according to patient size and/or use of iterative reconstruction technique. COMPARISON:  None Available. FINDINGS: Brain: Cerebral volume is within normal limits for age. No midline shift, ventriculomegaly, mass effect, evidence of mass lesion, intracranial hemorrhage or evidence of cortically based acute infarction. Gray-white differentiation is within normal limits for age. No encephalomalacia is identified. Vascular: No suspicious intracranial vascular hyperdensity. Calcified atherosclerosis at the skull base. Skull: Intact, negative. Sinuses/Orbits: Visualized paranasal sinuses and mastoids are well aerated. Mild ethmoid and right maxillary mucosal thickening. Other: No acute  orbit or scalp soft tissue finding. IMPRESSION: Normal for age noncontrast CT appearance of the Brain.  Electronically Signed   By: Odessa Fleming M.D.   On: 09/13/2022 11:47    Medications: I have reviewed the patient's current medications.  Assessment/Plan: 1.  Severe anemia/leukopenia with severe neutropenia 2.  History of vitamin B12 deficiency 3.  Hyponatremia 4.  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  History of CAD 6.  CHF 7.  Gout 8.  Indeterminate posterior right liver lesion on CT 07/28/2022  Mr. Confer is admitted with severe anemia and neutropenia.  He has exertional dyspnea and generalized weakness.  He was recently diagnosed with prosthetic valve endocarditis.  It is possible with the anemia and neutropenia are related to the recent episode of endocarditis.  I cannot relate the cytopenias to his current medical regimen.  I am concerned he has an underlying bone marrow process such as myelodysplasia.  The drop in the neutrophil count appears to be acute.  Amoxicillin can rarely be associated with severe neutropenia.  It appears the amoxicillin was started 08/30/2022.  Vitamin B12 deficiency does not appear to be causative.  The anemia is likely in large part related to chronic disease with a recent episode of bacteremia and endocarditis.  The platelet count is not low.  This argues against myelodysplasia or a hematopoietic malignancy.  Recommendations: Blood/urine cultures and start broad-spectrum intravenous antibiotics for a fever of greater than 101 degrees Consult infectious disease to consider changing the amoxicillin to a different antibiotic Bone marrow biopsy if the neutrophil count does not recover off of amoxicillin G-CSF if the white count does not recover over the next few days or if he develops an infection   LOS: 0 days   Thornton Papas, MD   09/14/2022, 8:13 AM

## 2022-09-14 NOTE — Plan of Care (Signed)

## 2022-09-15 ENCOUNTER — Inpatient Hospital Stay (HOSPITAL_COMMUNITY): Payer: Medicare PPO

## 2022-09-15 DIAGNOSIS — D709 Neutropenia, unspecified: Secondary | ICD-10-CM | POA: Diagnosis not present

## 2022-09-15 HISTORY — PX: IR BONE MARROW BIOPSY & ASPIRATION: IMG5727

## 2022-09-15 MED ORDER — LIDOCAINE HCL (PF) 1 % IJ SOLN
20.0000 mL | Freq: Once | INTRAMUSCULAR | Status: AC
  Administered 2022-09-15: 10 mL via INTRADERMAL
  Filled 2022-09-15: qty 20

## 2022-09-15 MED ORDER — FENTANYL CITRATE (PF) 100 MCG/2ML IJ SOLN
INTRAMUSCULAR | Status: AC | PRN
  Administered 2022-09-15: 50 ug via INTRAVENOUS

## 2022-09-15 MED ORDER — SODIUM CHLORIDE 0.9 % IV SOLN
250.0000 mg | Freq: Every day | INTRAVENOUS | Status: AC
  Administered 2022-09-15 – 2022-09-16 (×2): 250 mg via INTRAVENOUS
  Filled 2022-09-15 (×2): qty 20

## 2022-09-15 MED ORDER — FENTANYL CITRATE (PF) 100 MCG/2ML IJ SOLN
INTRAMUSCULAR | Status: AC
  Filled 2022-09-15: qty 2

## 2022-09-15 MED ORDER — LIDOCAINE HCL (PF) 1 % IJ SOLN
INTRAMUSCULAR | Status: AC
  Filled 2022-09-15: qty 30

## 2022-09-15 NOTE — Plan of Care (Signed)

## 2022-09-15 NOTE — Progress Notes (Signed)
PROGRESS NOTE  [name] PIR:[id number] DOB: 1940/08/13 DOA: 09/13/2022 PCP: Caesar Bookman, NP  HPI/Recap of past 24 hours: [name] is a 82 y.o. 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 Presents to the ED for evaluation of progressive weakness, found to have leukopenia, with anemia.  Hematologist consulted, patient admitted for further management.    Today, patient denies any new complaints, except for sore of the left side of his tongue.  Saw patient after bone marrow biopsy.    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 bili WNL Unknown etiology Daily CMP  History of prosthetic aortic valve endocarditis Diagnosed August 05, 2022 initially treated with Rocephin X 6 weeks, switched to PO amoxicillin indefinitely on 8/4, currently held on 8/7 ID recommends switching to cefadroxil 500 mg twice daily  Chronic systolic HF Appears euvolemic BNP 896, better than previous Echo with EF of 35 to 40%, global hypokinesis, left ventricular diastolic  function could not be evaluated Continue torsemide for now Monitor closely  CAD Continue aspirin   Hyperlipidemia Held atorvastatin due to mild transaminitis   HTN Continue metoprolol        Estimated body mass index is 21.62 kg/m as calculated from the following:   Height as of this encounter: 5\' 11"  (1.803 m).   Weight as of this encounter: 70.3 kg.     Code Status: Full  Family Communication: None at bedside  Disposition Plan: Status is: Inpatient The patient will require care spanning > 2 midnights and should be moved to inpatient because: Level of care      Consultants: Heme-onc ID  Procedures: None  Antimicrobials: Cefadroxil  DVT prophylaxis: Lovenox   Objective: Vitals:   09/15/22 0510 09/15/22 0900 09/15/22 0905 09/15/22 1332  BP: 116/77 139/76 (!) 141/73 121/72  Pulse: 79 86 77 74  Resp: 16   18  Temp: 98.2 F (36.8 C)   (!) 97.5 F (36.4 C)  TempSrc: Oral   Oral  SpO2: 98% 99% 97% 99%  Weight:      Height:        Intake/Output Summary (Last 24 hours) at 09/15/2022 1428 Last data filed at 09/14/2022 1831 Gross per 24 hour  Intake 985.01 ml  Output --  Net 985.01 ml   Filed Weights   09/13/22 0955 09/13/22 2108  Weight: 73.5 kg 70.3 kg    Exam: General: NAD  Cardiovascular: S1, S2 present Respiratory: CTAB Abdomen: Soft, nontender, nondistended, bowel sounds present Musculoskeletal: No bilateral pedal edema noted Skin: Normal Psychiatry: Normal mood     Data Reviewed: CBC: Recent Labs  Lab 09/13/22 1153 09/14/22 0515 09/15/22 0654  WBC 0.8* 0.9* 1.6*  NEUTROABS 0.1*  --  1.0*  HGB 8.0* 7.9* 8.0*  HCT 25.3* 25.0* 25.8*  MCV 91.7 91.2 94.5  PLT 248 252 267   Basic Metabolic Panel: Recent Labs  Lab 09/13/22 1153 09/14/22 0515 09/15/22 0654  NA 129* 128* 130*  K 3.4* 3.1* 3.7  CL 94* 92* 95*  CO2 25 25 24   GLUCOSE 112* 100* 92  BUN 14 12 10   CREATININE 0.82 0.82 0.80  CALCIUM 8.4* 8.1* 8.6*  MG 1.9  --    --    GFR: Estimated Creatinine Clearance: 72 mL/min (by C-G formula based on SCr of 0.8 mg/dL). Liver Function Tests: Recent Labs  Lab 09/13/22 1153 09/14/22 0515 09/15/22 0654  AST 77* 84* 73*  ALT 52* 67* 74*  ALKPHOS 45 42 42  BILITOT 0.6 0.7 0.7  PROT 5.9* 5.4* 5.4*  ALBUMIN 2.7* 2.4* 2.5*   No results for input(s): "LIPASE", "AMYLASE" in the last 168 hours. No results for input(s): "AMMONIA" in the last 168 hours. Coagulation Profile: No results for input(s): "INR", "PROTIME" in the last 168 hours. Cardiac Enzymes: No results for input(s): "CKTOTAL", "CKMB", "CKMBINDEX", "TROPONINI" in the last 168 hours. BNP (last 3 results) No results for input(s): "PROBNP" in the last 8760 hours. HbA1C: No results for input(s): "HGBA1C" in the last 72 hours. CBG: No results for input(s): "GLUCAP" in the last 168 hours. Lipid Profile: No results for input(s): "CHOL", "HDL", "LDLCALC", "TRIG", "CHOLHDL", "LDLDIRECT" in the last 72 hours. Thyroid Function Tests: Recent Labs    09/13/22 1150  TSH 1.124  FREET4 1.19*   Anemia Panel: Recent Labs    09/13/22 2012 09/13/22 2014  VITAMINB12 929*  --   FOLATE 26.8  --   FERRITIN 538*  --   TIBC 179*  --   IRON 16*  --   RETICCTPCT  --  1.2   Urine analysis:    Component Value Date/Time   COLORURINE YELLOW 07/28/2022 0556   APPEARANCEUR CLOUDY (A) 07/28/2022 0556   LABSPEC 1.016 07/28/2022 0556   PHURINE 5.0 07/28/2022 0556   GLUCOSEU NEGATIVE 07/28/2022 0556   HGBUR NEGATIVE 07/28/2022 0556   BILIRUBINUR NEGATIVE 07/28/2022 0556   KETONESUR 5 (A) 07/28/2022 0556   PROTEINUR NEGATIVE 07/28/2022 0556   NITRITE POSITIVE (A) 07/28/2022 0556   LEUKOCYTESUR LARGE (A) 07/28/2022 0556   Sepsis Labs: @LABRCNTIP (procalcitonin:4,lacticidven:4)  ) Recent Results (from the past 240 hour(s))  SARS Coronavirus 2 by RT PCR (hospital order, performed in [facility] hospital lab) *cepheid single result test* Anterior Nasal Swab      Status: None   Collection Time: 09/13/22 11:50 AM   Specimen: Anterior Nasal Swab  Result Value Ref Range Status   SARS Coronavirus 2 by RT PCR NEGATIVE NEGATIVE Final    Comment: (NOTE) SARS-CoV-2 target nucleic acids are NOT DETECTED.  The SARS-CoV-2 RNA is generally detectable in upper and lower respiratory specimens during the acute phase of infection. The lowest concentration of SARS-CoV-2 viral copies this assay can detect is 250 copies / mL. A negative result does not preclude SARS-CoV-2 infection and should not be used as the sole basis for treatment or other patient management decisions.  A negative result may occur with improper specimen collection / handling, submission of specimen other than nasopharyngeal swab, presence of viral mutation(s) within the areas targeted by this assay, and inadequate number of viral copies (<250 copies / mL). A negative result must be combined with clinical observations, patient history, and epidemiological information.  Fact  Sheet for Patients:   RoadLapTop.co.za  Fact Sheet for Healthcare Providers: http://kim-miller.com/  This test is not yet approved or  cleared by the [place] FDA and has been authorized for detection and/or diagnosis of SARS-CoV-2 by FDA under an Emergency Use Authorization (EUA).  This EUA will remain in effect (meaning this test can be used) for the duration of the COVID-19 declaration under Section 564(b)(1) of the Act, 21 U.S.C. section 360bbb-3(b)(1), unless the authorization is terminated or revoked sooner.  Performed at [facility], [address], [place], [place] [address]       Studies: IR BONE MARROW BIOPSY & ASPIRATION  Result Date: 09/15/2022 INDICATION: 82 year old with anemia and leukopenia. EXAM: FLUOROSCOPIC GUIDED BONE MARROW ASPIRATES AND BIOPSY Physician: Rachelle Hora. Lowella Dandy, MD MEDICATIONS: Fentanyl 50 mcg ANESTHESIA/SEDATION:  Patient's level of consciousness and vital signs were monitored continuously by radiology nurse throughout the procedure under the supervision of the provider performing the procedure. COMPLICATIONS: None immediate. PROCEDURE: The procedure was explained to the patient. The risks and benefits of the procedure were discussed and the patient's questions were addressed. Informed consent was obtained from the patient. The patient was placed prone on interventional table. The back was prepped and draped in sterile fashion. Maximal barrier sterile technique was utilized including caps, mask, sterile gowns, sterile gloves, sterile drape, hand hygiene and skin antiseptic. The skin and right posterior ilium were anesthetized with 1% lidocaine. 11 gauge bone needle was directed into the right ilium with CT guidance. Two aspirates and one core biopsy were obtained. Bandage placed over the puncture site. IMPRESSION: Fluoroscopic guided bone marrow aspiration and core biopsy. Electronically Signed   By: Richarda Overlie M.D.   On: 09/15/2022 09:57    Scheduled Meds:  ascorbic acid  500 mg Oral Daily   aspirin EC  81 mg Oral Daily   cefadroxil  1,000 mg Oral BID   diclofenac Sodium  2 g Topical QID   enoxaparin (LOVENOX) injection  40 mg Subcutaneous Q24H   loratadine  10 mg Oral Daily   metoprolol succinate  12.5 mg Oral Daily   mirabegron ER  25 mg Oral Daily   multivitamin with minerals  1 tablet Oral Daily   omega-3 acid ethyl esters  2 capsule Oral Daily   polyethylene glycol  17 g Oral Daily   sodium chloride flush  3 mL Intravenous Q12H   tamsulosin  0.4 mg Oral Daily   torsemide  10 mg Oral BID    Continuous Infusions:     LOS: 1 day     Briant Cedar, MD [facility]  If 7PM-7AM, please contact night-coverage [email or web address] 09/15/2022, 2:28 PM

## 2022-09-15 NOTE — Procedures (Signed)
Interventional Radiology Procedure:   Indications: Anemia and leukopenia  Procedure: Image guided bone marrow biopsy  Findings: 2 aspirates and 1 core from right ilium  Complications: None     EBL: Minimal, less than 10 ml  Plan: Bedrest 1 hour    R. Lowella Dandy, MD  Pager: [phone]

## 2022-09-15 NOTE — Progress Notes (Signed)
IP PROGRESS NOTE  Subjective:   [name] has noted a sore at the left side of the tongue.  No other complaint.   Objective: Vital signs in last 24 hours: Blood pressure (!) 141/73, pulse 77, temperature 98.2 F (36.8 C), temperature source Oral, resp. rate 16, height 5\' 11"  (1.803 m), weight 155 lb (70.3 kg), SpO2 97%.  Intake/Output from previous day: 08/07 0701 - 08/08 0700 In: 1225 [P.O.:480; I.V.:745] Out: 300 [Urine:300]  Physical Exam:  HEENT: No thrush, ulcer at the left side of the tongue, ulcer at the right angle of the lips  Cardiac: Irregular Abdomen: No hepatosplenomegaly Extremities: No leg edema    Lab Results: Recent Labs    09/14/22 0515 09/15/22 0654  WBC 0.9* 1.6*  HGB 7.9* 8.0*  HCT 25.0* 25.8*  PLT 252 267   Blood smear: The platelets appear normal in number, few platelet clumps.  Scattered large and giant platelets.  Numerous ovalocytes, few cigar cells, no nucleated red cells, the polychromasia is not increased.  Decreased leukocytes.  The majority are lymphocytes and monocytes.  No neutrophils seen.  No blasts. BMET Recent Labs    09/14/22 0515 09/15/22 0654  NA 128* 130*  K 3.1* 3.7  CL 92* 95*  CO2 25 24  GLUCOSE 100* 92  BUN 12 10  CREATININE 0.82 0.80  CALCIUM 8.1* 8.6*    No results found for: "CEA1", "CEA", "WJX914", "CA125"  Studies/Results: IR BONE MARROW BIOPSY & ASPIRATION  Result Date: 09/15/2022 INDICATION: 82 year old with anemia and leukopenia. EXAM: FLUOROSCOPIC GUIDED BONE MARROW ASPIRATES AND BIOPSY Physician: Rachelle Hora. Lowella Dandy, MD MEDICATIONS: Fentanyl 50 mcg ANESTHESIA/SEDATION: Patient's level of consciousness and vital signs were monitored continuously by radiology nurse throughout the procedure under the supervision of the provider performing the procedure. COMPLICATIONS: None immediate. PROCEDURE: The procedure was explained to the patient. The risks and benefits of the procedure were discussed and the patient's  questions were addressed. Informed consent was obtained from the patient. The patient was placed prone on interventional table. The back was prepped and draped in sterile fashion. Maximal barrier sterile technique was utilized including caps, mask, sterile gowns, sterile gloves, sterile drape, hand hygiene and skin antiseptic. The skin and right posterior ilium were anesthetized with 1% lidocaine. 11 gauge bone needle was directed into the right ilium with CT guidance. Two aspirates and one core biopsy were obtained. Bandage placed over the puncture site. IMPRESSION: Fluoroscopic guided bone marrow aspiration and core biopsy. Electronically Signed   By: Richarda Overlie M.D.   On: 09/15/2022 09:57    Medications: I have reviewed the patient's current medications.  Assessment/Plan: 1.  Severe anemia/leukopenia with severe neutropenia 2.  History of vitamin B12 deficiency 3.  Hyponatremia 4.  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  History of CAD 6.  CHF 7.  Gout 8.  Indeterminate posterior right liver lesion on CT 07/28/2022  [name] is admitted with severe anemia and neutropenia.  He had exertional dyspnea and generalized weakness.  He was recently diagnosed with prosthetic valve endocarditis.  I initially thought it was possible the neutropenia could be related to amoxicillin, but I received additional information that he only started amoxicillin 09/11/2022 which makes this less likely, but possible.  The neutrophil count is higher today.  The hemoglobin is stable  The etiology of the anemia and neutropenia remains unclear.  It is possible the anemia is related to the recent endocarditis.  He will undergo a bone marrow biopsy  today.  He may have a viral infection at the lip and tongue.  Recommendations: Consider antiviral therapy for the lip/tongue ulcers per the infectious disease service Continue holding amoxicillin Bone marrow biopsy today Please call hematology as  needed, I will be out until 09/19/2022, Dr. Myna Hidalgo will check on him tomorrow.  Outpatient follow-up will be scheduled in the hematology clinic.   LOS: 1 day   Thornton Papas, MD   09/15/2022, 1:31 PM

## 2022-09-15 NOTE — Consult Note (Signed)
Chief Complaint: Patient was seen in consultation today for image guided bone marrow biopsy Chief Complaint  Patient presents with   Weakness    Referring Physician(s): Sherrill,B  Supervising Physician: Richarda Overlie  Patient Status: [facility] - In-pt  History of Present Illness: [name] is an 82 y.o. male ex-smoker with past medical history significant for anemia, coronary artery disease/CABG,  AVR, CHF, gout,  hypertension, mitral regurgitation, aortic stenosis, vitamin B12 deficiency, strep sanguinous bacteremia and aortic valve endocarditis in June of this year who  was admitted to [facility] on 8/6 with weakness, dyspnea, constipation and labs revealing pancytopenia.  Request now received from oncology for image guided bone marrow biopsy for further evaluation.  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    HFrEF (heart failure with reduced ejection fraction) (HCC)    Hypertension    Moderate mitral regurgitation    Severe aortic stenosis     Past Surgical History:  Procedure Laterality Date   AORTIC VALVE REPLACEMENT N/A 07/24/2017   Procedure: AORTIC VALVE REPLACEMENT (AVR) using 23mm Inspiris Aortic Valve;  Surgeon: Alleen Borne, MD;  Location: [facility] OR;  Service: Open Heart Surgery;  Laterality: N/A;   CLIPPING OF ATRIAL APPENDAGE N/A 07/24/2017   Procedure: CLIPPING OF ATRIAL APPENDAGE using a 50mm Atricure clip;  Surgeon: Alleen Borne, MD;  Location: [facility] OR;  Service: Open Heart Surgery;  Laterality: N/A;   CORONARY ARTERY BYPASS GRAFT N/A 07/24/2017   Procedure: CORONARY ARTERY BYPASS GRAFTING (CABG) times 3 using the left greater saphenous vein harvested endoscopically and left internal mammary artery.;  Surgeon: Alleen Borne, MD;  Location: [facility] OR;  Service: Open Heart Surgery;  Laterality: N/A;   EYE SURGERY  Bilateral    lens replacements for cataracts   HERNIA REPAIR     INGUINAL HERNIA REPAIR Right 05/06/2015   Procedure: LAPAROSCOPIC RIGHT INGUINAL HERNIA WITH MESH;  Surgeon: Abigail Miyamoto, MD;  Location: [facility] ORS;  Service: General;  Laterality: Right;   INSERTION OF MESH Right 05/06/2015   Procedure: INSERTION OF MESH;  Surgeon: Abigail Miyamoto, MD;  Location: [facility] ORS;  Service: General;  Laterality: Right;   RIGHT/LEFT HEART CATH AND CORONARY ANGIOGRAPHY N/A 07/10/2017   Procedure: RIGHT/LEFT HEART CATH AND CORONARY ANGIOGRAPHY;  Surgeon: Elder Negus, MD;  Location: [facility] INVASIVE CV LAB;  Service: Cardiovascular;  Laterality: N/A;   TEE WITHOUT CARDIOVERSION N/A 07/11/2017   Procedure: TRANSESOPHAGEAL ECHOCARDIOGRAM (TEE);  Surgeon: Elder Negus, MD;  Location: [facility] ENDOSCOPY;  Service: Cardiovascular;  Laterality: N/A;   TEE WITHOUT CARDIOVERSION N/A 07/24/2017   Procedure: TRANSESOPHAGEAL ECHOCARDIOGRAM (TEE);  Surgeon: Alleen Borne, MD;  Location: [facility] OR;  Service: Open Heart Surgery;  Laterality: N/A;   TEE WITHOUT CARDIOVERSION N/A 08/01/2022   Procedure: TRANSESOPHAGEAL ECHOCARDIOGRAM;  Surgeon: Lewayne Bunting, MD;  Location: [facility] INVASIVE CV LAB;  Service: Cardiovascular;  Laterality: N/A;   TOTAL KNEE ARTHROPLASTY Right 08/07/2019   Procedure: TOTAL KNEE ARTHROPLASTY;  Surgeon: Ollen Gross, MD;  Location: [facility] ORS;  Service: Orthopedics;  Laterality: Right;   ULTRASOUND GUIDANCE FOR VASCULAR ACCESS  07/10/2017   Procedure: Ultrasound Guidance For Vascular Access;  Surgeon: Elder Negus, MD;  Location: [facility] INVASIVE CV LAB;  Service: Cardiovascular;;    Allergies: Sulfamethoxazole-trimethoprim  Medications:  Prior to Admission medications   Medication Sig Start Date End Date Taking? Authorizing Provider  acetaminophen (TYLENOL) 500 MG tablet Take 2 tablets (1,000 mg total) by mouth every 6 (six) hours as needed for mild pain or fever. 07/29/17  Yes Barrett, Erin R, PA-C   amoxicillin (AMOXIL) 500 MG tablet Take 2 tablets (1,000 mg total) by mouth 3 (three) times daily. 08/30/22  Yes Odette Fraction, MD  aspirin EC 81 MG tablet Take 81 mg by mouth daily. Swallow whole.   Yes [provider]  atorvastatin (LIPITOR) 40 MG tablet Take 1 tablet (40 mg total) by mouth daily. 08/04/22  Yes Glade Lloyd, MD  cetirizine (ZYRTEC) 10 MG tablet Take 10 mg by mouth daily.   Yes [provider]  [employer] VITAMIN C 500 MG tablet TAKE 1 TABLET (500 MG TOTAL) BY MOUTH DAILY. 08/23/22  Yes Ngetich, Dinah C, NP  metoprolol succinate (TOPROL-XL) 25 MG 24 hr tablet Take 0.5 tablets (12.5 mg total) by mouth daily. Take with or immediately following a meal. Patient taking differently: Take 25 mg by mouth daily. Take with or immediately following a meal. 08/25/22 11/23/22 Yes Patwardhan, Manish J, MD  Multiple Vitamin (MULTIVITAMIN ADULT PO) Take 1 tablet by mouth daily.   Yes [provider]  MYRBETRIQ 25 MG TB24 tablet Take 25 mg by mouth daily. 05/31/22  Yes [provider]  Omega-3 Fatty Acids (FISH OIL) 1000 MG CAPS Take 2 capsules by mouth daily.   Yes [provider]  polyethylene glycol (MIRALAX) 17 g packet Take 17 g by mouth daily. 09/12/22 10/12/22 Yes Wert, Trula Ore, NP  torsemide (DEMADEX) 10 MG tablet TAKE 1 TABLET BY MOUTH TWICE A DAY 09/01/22  Yes Patwardhan, Anabel Bene, MD     Family History  Problem Relation Age of Onset   Stroke Mother    Heart attack Father     Social History   Socioeconomic History   Marital status: Widowed    Spouse name: Not on file   Number of children: 1   Years of education: Not on file   Highest education level: Not on file  Occupational History   Not on file  Tobacco Use   Smoking status: Former    Current packs/day: 0.00    Average packs/day: 1 pack/day for 12.0 years (12.0 ttl pk-yrs)    Types: Cigarettes    Start date: 67    Quit date: 57    Years since quitting: 51.6   Smokeless  tobacco: Never   Tobacco comments:    quit 30-40 yrs ago  Vaping Use   Vaping status: Never Used  Substance and Sexual Activity   Alcohol use: Yes    Comment: 1-2 drinks   Drug use: Yes    Types: Marijuana   Sexual activity: Not Currently  Other Topics Concern   Not on file  Social History Narrative   Not on file   Social Determinants of Health   Financial Resource Strain: Low Risk  (05/13/2022)   Overall Financial Resource Strain (CARDIA)    Difficulty of Paying Living Expenses: Not hard at all  Food Insecurity: No Food Insecurity (09/13/2022)   Hunger Vital Sign    Worried About Running Out of Food in the Last Year: Never true    Ran Out of Food in the Last Year: Never true  Transportation Needs: No Transportation Needs (09/13/2022)   PRAPARE - Transportation    Lack of Transportation (Medical): No    Lack of  Transportation (Non-Medical): No  Physical Activity: Sufficiently Active (05/13/2022)   Exercise Vital Sign    Days of Exercise per Week: 7 days    Minutes of Exercise per Session: 60 min  Stress: No Stress Concern Present (05/13/2022)   [employer] of Occupational Health - Occupational Stress Questionnaire    Feeling of Stress : Not at all  Social Connections: Socially Integrated (05/13/2022)   Social Connection and Isolation Panel [NHANES]    Frequency of Communication with Friends and Family: More than three times a week    Frequency of Social Gatherings with Friends and Family: More than three times a week    Attends Religious Services: More than 4 times per year    Active Member of [employer] or Organizations: Yes    Attends Engineer, structural: More than 4 times per year    Marital Status: Married      Review of Systems currently denies fever, headache, chest pain, dyspnea, cough, abdominal/back pain, nausea, vomiting or bleeding.  He does have some fatigue.  Vital Signs: BP 116/77 (BP Location: Left Arm)   Pulse 79   Temp 98.2 F (36.8 C) (Oral)    Resp 16   Ht 5\' 11"  (1.803 m)   Wt 155 lb (70.3 kg)   SpO2 98%   BMI 21.62 kg/m   Advance Care Plan: No documents on file   Physical Exam: Awake, alert.  Chest clear to auscultation bilaterally.  Heart with regular rate and rhythm; abdomen soft, positive bowel sounds, nontender.  No lower extremity edema  Imaging: DG Chest Portable 1 View  Result Date: 09/13/2022 CLINICAL DATA:  Weakness EXAM: PORTABLE CHEST 1 VIEW COMPARISON:  07/28/2022 FINDINGS: Stable heart size status post sternotomy, CABG, cardiac valve replacement, and left atrial appendage clipping. Aortic atherosclerosis. No focal airspace consolidation, pleural effusion, or pneumothorax. IMPRESSION: No active disease. Electronically Signed   By: Duanne Guess D.O.   On: 09/13/2022 12:08   CT HEAD WO CONTRAST ( )  Result Date: 09/13/2022 CLINICAL DATA:  82 year old male with weakness and difficulty swallowing. EXAM: CT HEAD WITHOUT CONTRAST TECHNIQUE: Contiguous axial images were obtained from the base of the skull through the vertex without intravenous contrast. RADIATION DOSE REDUCTION: This exam was performed according to the departmental dose-optimization program which includes automated exposure control, adjustment of the mA and/or kV according to patient size and/or use of iterative reconstruction technique. COMPARISON:  None Available. FINDINGS: Brain: Cerebral volume is within normal limits for age. No midline shift, ventriculomegaly, mass effect, evidence of mass lesion, intracranial hemorrhage or evidence of cortically based acute infarction. Gray-white differentiation is within normal limits for age. No encephalomalacia is identified. Vascular: No suspicious intracranial vascular hyperdensity. Calcified atherosclerosis at the skull base. Skull: Intact, negative. Sinuses/Orbits: Visualized paranasal sinuses and mastoids are well aerated. Mild ethmoid and right maxillary mucosal thickening. Other: No acute orbit or scalp soft  tissue finding. IMPRESSION: Normal for age noncontrast CT appearance of the Brain. Electronically Signed   By: Odessa Fleming M.D.   On: 09/13/2022 11:47    Labs:  CBC: Recent Labs    08/02/22 0931 09/13/22 1153 09/14/22 0515 09/15/22 0654  WBC 7.6 0.8* 0.9* 1.6*  HGB 8.5* 8.0* 7.9* 8.0*  HCT 27.2* 25.3* 25.0* 25.8*  PLT 141* 248 252 267    COAGS: Recent Labs    07/28/22 0209  INR 1.2    BMP: Recent Labs    08/03/22 0421 08/22/22 1006 09/13/22 1153 09/14/22 0515 09/15/22 0654  NA  136 137 129* 128* 130*  K 4.5 5.5* 3.4* 3.1* 3.7  CL 102 100 94* 92* 95*  CO2 27 24 25 25 24   GLUCOSE 106* 96 112* 100* 92  BUN 15 16 14 12 10   CALCIUM 8.8* 9.5 8.4* 8.1* 8.6*  CREATININE 0.86 0.91 0.82 0.82 0.80  GFRNONAA >60  --  >60 >60 >60    LIVER FUNCTION TESTS: Recent Labs    07/28/22 0209 09/13/22 1153 09/14/22 0515 09/15/22 0654  BILITOT 1.0 0.6 0.7 0.7  AST 28 77* 84* 73*  ALT 32 52* 67* 74*  ALKPHOS 64 45 42 42  PROT 5.9* 5.9* 5.4* 5.4*  ALBUMIN 2.6* 2.7* 2.4* 2.5*    TUMOR MARKERS: No results for input(s): "AFPTM", "CEA", "CA199", "CHROMGRNA" in the last 8760 hours.  Assessment and Plan: 82 y.o. male ex-smoker with past medical history significant for anemia, coronary artery disease/CABG,  AVR, CHF, gout,  hypertension, mitral regurgitation, aortic stenosis, vitamin B12 deficiency, strep sanguinous bacteremia and aortic valve endocarditis in June of this year who  was admitted to [facility] on 8/6 with weakness, dyspnea, constipation and labs revealing pancytopenia.  Request now received from oncology for image guided bone marrow biopsy for further evaluation.Risks and benefits of procedure was discussed with the patient  including, but not limited to bleeding, infection, damage to adjacent structures or low yield requiring additional tests.  All of the questions were answered and there is agreement to proceed.  Consent signed and in chart.    Thank you for  this interesting consult.  I greatly enjoyed meeting Eiven Sanger and look forward to participating in their care.  A copy of this report was sent to the requesting provider on this date.  Electronically Signed: D. Jeananne Rama, PA-C 09/15/2022, 8:30 AM   I spent a total of 20 minutes    in face to face in clinical consultation, greater than 50% of which was counseling/coordinating care for image guided bone marrow biopsy

## 2022-09-16 ENCOUNTER — Other Ambulatory Visit: Payer: Self-pay | Admitting: Family

## 2022-09-16 ENCOUNTER — Telehealth: Payer: Self-pay

## 2022-09-16 DIAGNOSIS — D709 Neutropenia, unspecified: Secondary | ICD-10-CM | POA: Diagnosis not present

## 2022-09-16 MED ORDER — SODIUM CHLORIDE 0.9 % IV SOLN
250.0000 mg | Freq: Every day | INTRAVENOUS | Status: DC
  Administered 2022-09-17: 250 mg via INTRAVENOUS
  Filled 2022-09-16: qty 20

## 2022-09-16 MED ORDER — VALACYCLOVIR HCL 500 MG PO TABS
1000.0000 mg | ORAL_TABLET | Freq: Two times a day (BID) | ORAL | Status: DC
  Administered 2022-09-16 – 2022-09-17 (×3): 1000 mg via ORAL
  Filled 2022-09-16 (×3): qty 2

## 2022-09-16 NOTE — Telephone Encounter (Signed)
Message sent by mistake, per JR

## 2022-09-16 NOTE — Telephone Encounter (Signed)
Patient has request refill on medication that has been given by another provider. I think this may have been in the hospital. Medication pend and sent to PCP Ngetich, Donalee Citrin, NP

## 2022-09-16 NOTE — Progress Notes (Signed)
PROGRESS NOTE  [name] PPI:[id number] DOB: 1940/08/10 DOA: 09/13/2022 PCP: Caesar Bookman, NP  HPI/Recap of past 24 hours: [name] is a 82 y.o. 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 Presents to the ED for evaluation of progressive weakness, found to have leukopenia, with anemia.  Hematologist consulted, patient admitted for further management.    Today, pt denies any new complaints.   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   Weight as of this encounter: 70.3 kg.     Code Status: Full  Family Communication: None at bedside  Disposition Plan: Status is: Inpatient The patient will require care spanning > 2 midnights and should be moved to inpatient because: Level of care      Consultants: Heme-onc ID  Procedures: Bone marrow biopsy  Antimicrobials: Cefadroxil  DVT prophylaxis: Lovenox   Objective: Vitals:   09/15/22 1332 09/15/22 2041 09/16/22 0555 09/16/22 1308  BP: 121/72 125/77 (!) 103/53 111/70  Pulse: 74 75 68 69  Resp: 18  14 20   Temp: (!) 97.5 F (36.4 C) 98.1 F (36.7 C) 97.9 F (36.6 C) 98 F (36.7 C)  TempSrc: Oral Oral Oral Oral  SpO2: 99% 100% 96% 97%  Weight:      Height:        Intake/Output Summary (Last 24 hours) at 09/16/2022 1555 Last data filed at 09/16/2022 1500 Gross per 24 hour  Intake 900 ml  Output 400 ml  Net 500 ml   Filed Weights   09/13/22 0955 09/13/22 2108  Weight: 73.5 kg 70.3 kg    Exam: General: NAD  Cardiovascular: S1, S2 present Respiratory: CTAB Abdomen: Soft, nontender, nondistended, bowel sounds present Musculoskeletal: No bilateral pedal edema noted Skin: Normal Psychiatry: Normal mood     Data Reviewed: CBC: Recent Labs  Lab 09/13/22 1153 09/14/22  2956 09/15/22 0654 09/16/22 0629  WBC 0.8* 0.9* 1.6* 2.5*  NEUTROABS 0.1*  --  1.0* 1.4*  HGB 8.0* 7.9* 8.0* 7.7*  HCT 25.3* 25.0* 25.8* 24.7*  MCV 91.7 91.2 94.5 93.2  PLT 248 252 267 264   Basic Metabolic Panel: Recent Labs  Lab 09/13/22 1153 09/14/22 0515 09/15/22 0654 09/16/22 0629  NA 129* 128* 130* 132*  K 3.4* 3.1* 3.7 3.9  CL 94* 92* 95*  94*  CO2 25 25 24 29   GLUCOSE 112* 100* 92 109*  BUN 14 12 10 10   CREATININE 0.82 0.82 0.80 0.85  CALCIUM 8.4* 8.1* 8.6* 8.9  MG 1.9  --   --   --    GFR: Estimated Creatinine Clearance: 67.8 mL/min (by C-G formula based on SCr of 0.85 mg/dL). Liver Function Tests: Recent Labs  Lab 09/13/22 1153 09/14/22 0515 09/15/22 0654 09/16/22 0629  AST 77* 84* 73* 43*  ALT 52* 67* 74* 62*  ALKPHOS 45 42 42 46  BILITOT 0.6 0.7 0.7 0.4  PROT 5.9* 5.4* 5.4* 5.6*  ALBUMIN 2.7* 2.4* 2.5* 2.5*   No results for input(s): "LIPASE", "AMYLASE" in the last 168 hours. No results for input(s): "AMMONIA" in the last 168 hours. Coagulation Profile: No results for input(s): "INR", "PROTIME" in the last 168 hours. Cardiac Enzymes: No results for input(s): "CKTOTAL", "CKMB", "CKMBINDEX", "TROPONINI" in the last 168 hours. BNP (last 3 results) No results for input(s): "PROBNP" in the last 8760 hours. HbA1C: No results for input(s): "HGBA1C" in the last 72 hours. CBG: No results for input(s): "GLUCAP" in the last 168 hours. Lipid Profile: No results for input(s): "CHOL", "HDL", "LDLCALC", "TRIG", "CHOLHDL", "LDLDIRECT" in the last 72 hours. Thyroid Function Tests: No results for input(s): "TSH", "T4TOTAL", "FREET4", "T3FREE", "THYROIDAB" in the last 72 hours.  Anemia Panel: Recent Labs    09/13/22 2012 09/13/22 2014  VITAMINB12 929*  --   FOLATE 26.8  --   FERRITIN 538*  --   TIBC 179*  --   IRON 16*  --   RETICCTPCT  --  1.2   Urine analysis:    Component Value Date/Time   COLORURINE YELLOW 07/28/2022 0556   APPEARANCEUR CLOUDY (A) 07/28/2022 0556   LABSPEC 1.016 07/28/2022 0556   PHURINE 5.0 07/28/2022 0556   GLUCOSEU NEGATIVE 07/28/2022 0556   HGBUR NEGATIVE 07/28/2022 0556   BILIRUBINUR NEGATIVE 07/28/2022 0556   KETONESUR 5 (A) 07/28/2022 0556   PROTEINUR NEGATIVE 07/28/2022 0556   NITRITE POSITIVE (A) 07/28/2022 0556   LEUKOCYTESUR LARGE (A) 07/28/2022 0556   Sepsis  Labs: @LABRCNTIP (procalcitonin:4,lacticidven:4)  ) Recent Results (from the past 240 hour(s))  SARS Coronavirus 2 by RT PCR (hospital order, performed in [facility] hospital lab) *cepheid single result test* Anterior Nasal Swab     Status: None   Collection Time: 09/13/22 11:50 AM   Specimen: Anterior Nasal Swab  Result Value Ref Range Status   SARS Coronavirus 2 by RT PCR NEGATIVE NEGATIVE Final    Comment: (NOTE) SARS-CoV-2 target nucleic acids are NOT DETECTED.  The SARS-CoV-2 RNA is generally detectable in upper and lower respiratory specimens during the acute phase of infection. The lowest concentration of SARS-CoV-2 viral copies this assay can detect is 250 copies / mL. A negative result does not preclude SARS-CoV-2 infection and should not be used as the sole basis for treatment or other patient management decisions.  A negative result may occur with improper specimen collection / handling, submission of specimen other than nasopharyngeal  swab, presence of viral mutation(s) within the areas targeted by this assay, and inadequate number of viral copies (<250 copies / mL). A negative result must be combined with clinical observations, patient history, and epidemiological information.  Fact Sheet for Patients:   RoadLapTop.co.za  Fact Sheet for Healthcare Providers: http://kim-miller.com/  This test is not yet approved or  cleared by the [place] FDA and has been authorized for detection and/or diagnosis of SARS-CoV-2 by FDA under an Emergency Use Authorization (EUA).  This EUA will remain in effect (meaning this test can be used) for the duration of the COVID-19 declaration under Section 564(b)(1) of the Act, 21 U.S.C. section 360bbb-3(b)(1), unless the authorization is terminated or revoked sooner.  Performed at [facility], [address] [place], [place] [address]       Studies: No results  found.  Scheduled Meds:  ascorbic acid  500 mg Oral Daily   aspirin EC  81 mg Oral Daily   cefadroxil  1,000 mg Oral BID   diclofenac Sodium  2 g Topical QID   enoxaparin (LOVENOX) injection  40 mg Subcutaneous Q24H   loratadine  10 mg Oral Daily   metoprolol succinate  12.5 mg Oral Daily   mirabegron ER  25 mg Oral Daily   multivitamin with minerals  1 tablet Oral Daily   omega-3 acid ethyl esters  2 capsule Oral Daily   polyethylene glycol  17 g Oral Daily   sodium chloride flush  3 mL Intravenous Q12H   tamsulosin  0.4 mg Oral Daily   torsemide  10 mg Oral BID   valACYclovir  1,000 mg Oral BID    Continuous Infusions:     LOS: 2 days     Briant Cedar, MD [facility]  If 7PM-7AM, please contact night-coverage [email or web address] 09/16/2022, 3:55 PM

## 2022-09-16 NOTE — Telephone Encounter (Signed)
What is the question? 

## 2022-09-16 NOTE — Progress Notes (Addendum)
[facility]  Date of Admission:  09/13/2022   Total days of inpatient antibiotics 4  Principal Problem:   Leukopenia Active Problems:   Weakness          Assessment: 51 YM with multiple comorbidities including CAD, CHF, moderate MR, severe aortic stenosis status post CABG, clipping of aortic appendage and prosthetic aortic valve replacement complicated by strep sanguinous bacteremia prosthetic AV endocarditis treated with ceftriaxone x 6 weeks EOT 8/3 and started on amoxicillin 1 g p.o. 3 times daily on 8/4 for chronic suppression, followed by Dr. 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 suppression #B12 deficiency #History of anemia followed by Truett Perna heme-onc - Patient with progressive weakness found to have leukopenia and anemia.  ANC 100, hemoglobin 7.9, mild transaminitis. - Oncology engaged, patient followed by Dr. Truett Perna outpatient etiology of neutropenia is unclear.  Noted that B12 deficiency does not appear to be causative.  Suppressive amoxicillin was stopped and if counts do not recover then possible marrow biopsy. -ANC 2300 on 7/22, 6400 on 7/1, 9500 on 6/20.  Appears that has been downtrending for a while. ANC 1400 today 8/9(amox held->started on cefadroxil for suppression) Recommendations:  -cefadroxil 1gm twice daily  Discussed plan with son over the phone as well. - F/U bone biopsy. Suspect neutropenia which is now resolving is likely secondary to either likely ceftriaxone IV than just a few days of amoxicillin versus underlying malignancy. - Follow-up with infectious disease Dr. 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  SUBJECTIVE: Resting in bed. More alert this AM.  Interval: Afebrile overnight  Review of Systems: Review of Systems  All other systems reviewed and are negative.    Scheduled Meds:  ascorbic acid  500 mg Oral Daily   aspirin EC  81 mg Oral Daily   cefadroxil  1,000 mg Oral BID   diclofenac Sodium  2 g Topical QID   enoxaparin (LOVENOX) injection  40 mg Subcutaneous Q24H   loratadine  10 mg Oral Daily   metoprolol succinate  12.5 mg Oral Daily   mirabegron ER  25 mg Oral Daily   multivitamin with minerals  1 tablet Oral Daily   omega-3 acid ethyl esters  2 capsule Oral Daily   polyethylene glycol  17 g Oral Daily   sodium chloride flush  3 mL Intravenous Q12H   tamsulosin  0.4 mg Oral Daily   torsemide  10 mg Oral BID   Continuous Infusions:  ferric gluconate (FERRLECIT) IVPB 250 mg (09/16/22 1020)   PRN Meds:.acetaminophen **OR** acetaminophen, bisacodyl, hydrALAZINE, HYDROcodone-acetaminophen, ondansetron **OR** ondansetron (ZOFRAN) IV, senna-docusate, traZODone, witch hazel-glycerin Allergies  Allergen Reactions   Sulfamethoxazole-Trimethoprim Rash    OBJECTIVE: Vitals:   09/15/22 0905 09/15/22 1332 09/15/22 2041 09/16/22 0555  BP: (!) 141/73 121/72 125/77 (!) 103/53  Pulse: 77 74 75 68  Resp:  18  14  Temp:  (!) 97.5 F (36.4 C) 98.1 F (36.7 C) 97.9 F (36.6 C)  TempSrc:  Oral Oral Oral  SpO2: 97% 99% 100% 96%  Weight:      Height:  Body mass index is 21.62 kg/m.  Physical Exam Constitutional:      General: He is not in acute distress.    Appearance: He is normal weight. He is not toxic-appearing.  HENT:     Head: Normocephalic and atraumatic.     Right Ear: External ear normal.     Left Ear: External ear normal.     Nose: No congestion or rhinorrhea.     Mouth/Throat:     Mouth: Mucous membranes are moist.     Pharynx:  Oropharynx is clear.  Eyes:     Extraocular Movements: Extraocular movements intact.     Conjunctiva/sclera: Conjunctivae normal.     Pupils: Pupils are equal, round, and reactive to light.  Cardiovascular:     Rate and Rhythm: Normal rate and regular rhythm.     Heart sounds: No murmur heard.    No friction rub. No gallop.  Pulmonary:     Effort: Pulmonary effort is normal.     Breath sounds: Normal breath sounds.  Abdominal:     General: Abdomen is flat. Bowel sounds are normal.     Palpations: Abdomen is soft.  Musculoskeletal:        General: No swelling. Normal range of motion.     Cervical back: Normal range of motion and neck supple.  Skin:    General: Skin is warm and dry.  Neurological:     General: No focal deficit present.     Mental Status: He is oriented to person, place, and time.  Psychiatric:        Mood and Affect: Mood normal.       Lab Results Lab Results  Component Value Date   WBC 2.5 (L) 09/16/2022   HGB 7.7 (L) 09/16/2022   HCT 24.7 (L) 09/16/2022   MCV 93.2 09/16/2022   PLT 264 09/16/2022    Lab Results  Component Value Date   CREATININE 0.85 09/16/2022   BUN 10 09/16/2022   NA 132 (L) 09/16/2022   K 3.9 09/16/2022   CL 94 (L) 09/16/2022   CO2 29 09/16/2022    Lab Results  Component Value Date   ALT 62 (H) 09/16/2022   AST 43 (H) 09/16/2022   ALKPHOS 46 09/16/2022   BILITOT 0.4 09/16/2022        Danelle Earthly, MD [facility] 09/16/2022, 11:26 AM   I have personally spent 55 minutes involved in face-to-face and non-face-to-face activities for this patient on the day of the visit. Professional time spent includes the following activities: Preparing to see the patient (review of tests), Obtaining and/or reviewing separately obtained history (admission/discharge record), Performing a medically appropriate examination and/or evaluation , Ordering medications/tests/procedures,  referring and communicating with other health care professionals, Documenting clinical information in the EMR, Independently interpreting results (not separately reported), Communicating results to the patient/family/caregiver, Counseling and educating the patient/family/caregiver and Care coordination (not separately reported).

## 2022-09-17 DIAGNOSIS — D708 Other neutropenia: Secondary | ICD-10-CM | POA: Diagnosis not present

## 2022-09-17 DIAGNOSIS — D709 Neutropenia, unspecified: Secondary | ICD-10-CM | POA: Diagnosis not present

## 2022-09-17 MED ORDER — VALACYCLOVIR HCL 1 G PO TABS: 1000.0000 mg | ORAL_TABLET | Freq: Two times a day (BID) | ORAL | 0 refills | Status: AC

## 2022-09-17 MED ORDER — FERROUS GLUCONATE 324 (38 FE) MG PO TABS: 324.0000 mg | ORAL_TABLET | Freq: Every day | ORAL | 0 refills | Status: DC

## 2022-09-17 MED ORDER — CEFADROXIL 500 MG PO CAPS: 1000.0000 mg | ORAL_CAPSULE | Freq: Two times a day (BID) | ORAL | 0 refills | Status: DC

## 2022-09-17 MED ORDER — TAMSULOSIN HCL 0.4 MG PO CAPS: 0.4000 mg | ORAL_CAPSULE | Freq: Every day | ORAL | 0 refills | Status: DC

## 2022-09-17 NOTE — Discharge Summary (Signed)
Physician Discharge Summary   Patient: [name] MRN: [id number] DOB: December 26, 1940  Admit date:     09/13/2022  Discharge date: 09/17/22  Discharge Physician: Briant Cedar   PCP: Caesar Bookman, NP   Recommendations at discharge:   Follow up with PCP Follow up with ID Follow up with hem/onc  Discharge Diagnoses: Principal Problem:   Leukopenia Active Problems:   Weakness    Hospital Course: [name] is a 82 y.o. 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 Presents to the ED for evaluation of progressive weakness, found to have leukopenia, with anemia. Hematologist consulted, patient admitted for further management.    Today, pt denies any new complaints. Eager to be discharged. Discussed with patient and friend at bedside. Follow up with PCP, ID and Hem/onc.    Assessment and Plan:  Leukopenia Heme-onc consulted, possibly related to recent amoxicillin vs ceftriaxone use ID consulted for alternative antibiotics, recommended cefadroxil 500 mg twice daily lifelong Bone biopsy done on 8/8, awaiting results (Dr Truett Perna will follow up with result) Follow up with PCP/hem-onc   Iron def anemia Anemia of chronic disease Hemoglobin dropped to 7.9, baseline between 8-9 No evidence of bleeding Anemia panel showed iron 16, sats 9, ferritin 538, folate 26.8, B12-->929 S/p IV ferric gluconate X 3 doses, discharged on PO iron supplementation Follow up with PCP   Weakness likely secondary to above Fall precautions   Hypokalemia Replace as needed   Mild transaminitis Improved Elevated AST, ALT, T. 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 Continue Valtrex X 10 days   CAD Continue aspirin   Hyperlipidemia Held atorvastatin due to mild transaminitis during hospitalization Restarted lipitor, if transaminitis reoccurs, may consider dose adjustment   HTN Continue metoprolol       Consultants: Hem-onc, ID Procedures performed: Bone marrow biopsy Disposition: Home Diet recommendation:  Cardiac diet    DISCHARGE MEDICATION: Allergies as of 09/17/2022       Reactions   Sulfamethoxazole-trimethoprim Rash        Medication List     STOP taking these medications    amoxicillin 500 MG tablet Commonly known as: AMOXIL   silodosin 8 MG Caps capsule Commonly known as: RAPAFLO Replaced by: tamsulosin 0.4 MG Caps capsule       TAKE these medications    acetaminophen 500 MG tablet Commonly known as: TYLENOL Take 2 tablets (1,000 mg total) by mouth every 6 (six) hours as needed for mild pain or fever.   ascorbic acid 500 MG tablet Commonly known as: VITAMIN C TAKE 1 TABLET (500 MG TOTAL) BY MOUTH DAILY.   aspirin EC 81 MG tablet Take 81 mg by mouth daily. Swallow whole.   atorvastatin 40 MG tablet Commonly known  as: LIPITOR Take 1 tablet (40 mg total) by mouth daily.   cefadroxil 500 MG capsule Commonly known as: DURICEF Take 2 capsules (1,000 mg total) by mouth 2 (two) times daily.   cetirizine 10 MG tablet Commonly known as: ZYRTEC Take 10 mg by mouth daily.   ferrous  gluconate 324 MG tablet Commonly known as: FERGON Take 1 tablet (324 mg total) by mouth daily with breakfast.   Fish Oil 1000 MG Caps Take 2 capsules by mouth daily.   metoprolol succinate 25 MG 24 hr tablet Commonly known as: TOPROL-XL Take 0.5 tablets (12.5 mg total) by mouth daily. Take with or immediately following a meal. What changed: how much to take   MULTIVITAMIN ADULT PO Take 1 tablet by mouth daily.   Myrbetriq 25 MG Tb24 tablet Generic drug: mirabegron ER Take 25 mg by mouth daily.   polyethylene glycol 17 g packet Commonly known as: MiraLax Take 17 g by mouth daily.   tamsulosin 0.4 MG Caps capsule Commonly known as: FLOMAX Take 1 capsule (0.4 mg total) by mouth daily. Start taking on: September 18, 2022 Replaces: silodosin 8 MG Caps capsule   torsemide 10 MG tablet Commonly known as: DEMADEX TAKE 1 TABLET BY MOUTH TWICE A DAY   valACYclovir 1000 MG tablet Commonly known as: VALTREX Take 1 tablet (1,000 mg total) by mouth 2 (two) times daily for 17 doses.        Follow-up Information     Ngetich, Dinah C, NP. Schedule an appointment as soon as possible for a visit in 1 week(s).   Specialty: Family Medicine Contact information: [address] [place] [address] [phone]         Ladene Artist, MD Follow up.   Specialty: Oncology Why: Office will call you for your biopsy report and follow up if need be Contact information: [address] [place] [address] [phone]                Discharge Exam: Ceasar Mons Weights   09/13/22 0955 09/13/22 2108  Weight: 73.5 kg 70.3 kg   General: NAD  Cardiovascular: S1, S2 present Respiratory: CTAB Abdomen: Soft, nontender, nondistended, bowel sounds present Musculoskeletal: No bilateral pedal edema noted Skin: Normal Psychiatry: Normal mood   Condition at discharge: stable  The results of significant diagnostics from this hospitalization (including imaging, microbiology,  ancillary and laboratory) are listed below for reference.   Imaging Studies: IR BONE MARROW BIOPSY & ASPIRATION  Result Date: 09/15/2022 INDICATION: 82 year old with anemia and leukopenia. EXAM: FLUOROSCOPIC GUIDED BONE MARROW ASPIRATES AND BIOPSY Physician: Rachelle Hora. Lowella Dandy, MD MEDICATIONS: Fentanyl 50 mcg ANESTHESIA/SEDATION: Patient's level of consciousness and vital signs were monitored continuously by radiology nurse throughout the procedure under the supervision of the provider performing the procedure. COMPLICATIONS: None immediate. PROCEDURE: The procedure was explained to the patient. The risks and benefits of the procedure were discussed and the patient's questions were addressed. Informed consent was obtained from the patient. The patient was placed prone on interventional table. The back was prepped and draped in sterile fashion. Maximal barrier sterile technique was utilized including caps, mask, sterile gowns, sterile gloves, sterile drape, hand hygiene and skin antiseptic. The skin and right posterior ilium were anesthetized with 1% lidocaine. 11 gauge bone needle was directed into the right ilium with CT guidance. Two aspirates and one core biopsy were obtained. Bandage placed over the puncture site. IMPRESSION: Fluoroscopic guided bone marrow aspiration and core biopsy. Electronically Signed   By: Madelaine Bhat  Lowella Dandy M.D.   On: 09/15/2022 09:57   DG Chest Portable 1 View  Result Date: 09/13/2022 CLINICAL DATA:  Weakness EXAM: PORTABLE CHEST 1 VIEW COMPARISON:  07/28/2022 FINDINGS: Stable heart size status post sternotomy, CABG, cardiac valve replacement, and left atrial appendage clipping. Aortic atherosclerosis. No focal airspace consolidation, pleural effusion, or pneumothorax. IMPRESSION: No active disease. Electronically Signed   By: Duanne Guess D.O.   On: 09/13/2022 12:08   CT HEAD WO CONTRAST ( )  Result Date: 09/13/2022 CLINICAL DATA:  82 year old male with weakness and difficulty  swallowing. EXAM: CT HEAD WITHOUT CONTRAST TECHNIQUE: Contiguous axial images were obtained from the base of the skull through the vertex without intravenous contrast. RADIATION DOSE REDUCTION: This exam was performed according to the departmental dose-optimization program which includes automated exposure control, adjustment of the mA and/or kV according to patient size and/or use of iterative reconstruction technique. COMPARISON:  None Available. FINDINGS: Brain: Cerebral volume is within normal limits for age. No midline shift, ventriculomegaly, mass effect, evidence of mass lesion, intracranial hemorrhage or evidence of cortically based acute infarction. Gray-white differentiation is within normal limits for age. No encephalomalacia is identified. Vascular: No suspicious intracranial vascular hyperdensity. Calcified atherosclerosis at the skull base. Skull: Intact, negative. Sinuses/Orbits: Visualized paranasal sinuses and mastoids are well aerated. Mild ethmoid and right maxillary mucosal thickening. Other: No acute orbit or scalp soft tissue finding. IMPRESSION: Normal for age noncontrast CT appearance of the Brain. Electronically Signed   By: Odessa Fleming M.D.   On: 09/13/2022 11:47    Microbiology: Results for orders placed or performed during the hospital encounter of 09/13/22  SARS Coronavirus 2 by RT PCR (hospital order, performed in [facility] hospital lab) *cepheid single result test* Anterior Nasal Swab     Status: None   Collection Time: 09/13/22 11:50 AM   Specimen: Anterior Nasal Swab  Result Value Ref Range Status   SARS Coronavirus 2 by RT PCR NEGATIVE NEGATIVE Final    Comment: (NOTE) SARS-CoV-2 target nucleic acids are NOT DETECTED.  The SARS-CoV-2 RNA is generally detectable in upper and lower respiratory specimens during the acute phase of infection. The lowest concentration of SARS-CoV-2 viral copies this assay can detect is 250 copies / mL. A negative result does not preclude  SARS-CoV-2 infection and should not be used as the sole basis for treatment or other patient management decisions.  A negative result may occur with improper specimen collection / handling, submission of specimen other than nasopharyngeal swab, presence of viral mutation(s) within the areas targeted by this assay, and inadequate number of viral copies (<250 copies / mL). A negative result must be combined with clinical observations, patient history, and epidemiological information.  Fact Sheet for Patients:   RoadLapTop.co.za  Fact Sheet for Healthcare Providers: http://kim-miller.com/  This test is not yet approved or  cleared by the [place] FDA and has been authorized for detection and/or diagnosis of SARS-CoV-2 by FDA under an Emergency Use Authorization (EUA).  This EUA will remain in effect (meaning this test can be used) for the duration of the COVID-19 declaration under Section 564(b)(1) of the Act, 21 U.S.C. section 360bbb-3(b)(1), unless the authorization is terminated or revoked sooner.  Performed at [facility], [address] [place], [place] [address]     Labs: CBC: Recent Labs  Lab 09/13/22 1153 09/14/22 0515 09/15/22 0654 09/16/22 0629 09/17/22 0631  WBC 0.8* 0.9* 1.6* 2.5* 3.1*  NEUTROABS 0.1*  --  1.0* 1.4* 1.9  HGB  8.0* 7.9* 8.0* 7.7* 8.3*  HCT 25.3* 25.0* 25.8* 24.7* 26.4*  MCV 91.7 91.2 94.5 93.2 95.0  PLT 248 252 267 264 282   Basic Metabolic Panel: Recent Labs  Lab 09/13/22 1153 09/14/22 0515 09/15/22 0654 09/16/22 0629 09/17/22 0631  NA 129* 128* 130* 132* 132*  K 3.4* 3.1* 3.7 3.9 3.7  CL 94* 92* 95* 94* 96*  CO2 25 25 24 29 25   GLUCOSE 112* 100* 92 109* 97  BUN 14 12 10 10 11   CREATININE 0.82 0.82 0.80 0.85 0.82  CALCIUM 8.4* 8.1* 8.6* 8.9 8.7*  MG 1.9  --   --   --   --    Liver Function Tests: Recent Labs  Lab 09/13/22 1153 09/14/22 0515 09/15/22 0654  09/16/22 0629 09/17/22 0631  AST 77* 84* 73* 43* 22  ALT 52* 67* 74* 62* 47*  ALKPHOS 45 42 42 46 48  BILITOT 0.6 0.7 0.7 0.4 0.6  PROT 5.9* 5.4* 5.4* 5.6* 5.7*  ALBUMIN 2.7* 2.4* 2.5* 2.5* 2.6*   CBG: No results for input(s): "GLUCAP" in the last 168 hours.  Discharge time spent: greater than 30 minutes.  Signed: Briant Cedar, MD [facility] 09/17/2022

## 2022-09-17 NOTE — Plan of Care (Signed)
  Problem: Health Behavior/Discharge Planning: Goal: Ability to manage health-related needs will improve Outcome: Progressing   Problem: Health Behavior/Discharge Planning: Goal: Ability to manage health-related needs will improve Outcome: Progressing   Problem: Pain Managment: Goal: General experience of comfort will improve Outcome: Progressing   Problem: Pain Managment: Goal: General experience of comfort will improve Outcome: Progressing   Problem: Skin Integrity: Goal: Risk for impaired skin integrity will decrease Outcome: Progressing   Problem: Skin Integrity: Goal: Risk for impaired skin integrity will decrease Outcome: Progressing

## 2022-09-17 NOTE — Progress Notes (Signed)
Patient discharged to home with family. Discharge instructions reviewed with patient who verbalized understanding.  ?

## 2022-09-17 NOTE — Progress Notes (Signed)
[name] is feeling well.  He would like to go home.  I do not see any reason why cannot go home.  His white cell count is coming up.  His hemoglobin is trending up.  His CBC shows white count 3.1.  Hemoglobin 8.3.  Platelet count 282,000.  His sodium 132.  Potassium 3.7.  BUN 11 creatinine 0.82.  Calcium 8.7 with an albumin of 2.6.  He is eating okay.  He is having no problems with bowels or bladder.  He did get some iron.  He got the iron because his iron saturation was only 9%.  He says his mouth feels good.  He does not have any sores on the tongue.  He has had no fever.  He has had no bleeding.  He has had no cough or shortness of breath.  He has had no rashes.  His vital signs are temperature of 98.2.  Pulse 66.  Blood pressure 123/62.  His exam is relatively unremarkable.  His lungs sound clear.  Cardiac exam regular rate and rhythm.  His abdomen soft.  Extremity shows no clubbing, cyanosis or edema.  [name] had severe anemia and neutropenia.  The neutropenia is resolved.  This could certainly could be from medications.  He did have a bone marrow biopsy done.  I do not have the results back yet.  Again, I think he can go home.  I do not see a reason why he needs to still be in the hospital.  He can follow-up with Dr. Truett Perna as an outpatient.   Christin Bach, MD  Psalm 16:11

## 2022-09-19 ENCOUNTER — Other Ambulatory Visit: Payer: Self-pay | Admitting: *Deleted

## 2022-09-19 ENCOUNTER — Telehealth: Payer: Self-pay

## 2022-09-19 ENCOUNTER — Encounter: Payer: Self-pay | Admitting: *Deleted

## 2022-09-19 ENCOUNTER — Ambulatory Visit: Payer: Medicare PPO

## 2022-09-19 DIAGNOSIS — I38 Endocarditis, valve unspecified: Secondary | ICD-10-CM | POA: Diagnosis not present

## 2022-09-19 DIAGNOSIS — I25810 Atherosclerosis of coronary artery bypass graft(s) without angina pectoris: Secondary | ICD-10-CM | POA: Diagnosis not present

## 2022-09-19 DIAGNOSIS — D649 Anemia, unspecified: Secondary | ICD-10-CM

## 2022-09-19 DIAGNOSIS — I491 Atrial premature depolarization: Secondary | ICD-10-CM | POA: Diagnosis not present

## 2022-09-19 LAB — SURGICAL PATHOLOGY

## 2022-09-19 NOTE — Progress Notes (Signed)
Per Dr. Truett Perna: Needs lab/OV week of 8/19. Scheduler notified.

## 2022-09-19 NOTE — Transitions of Care (Post Inpatient/ED Visit) (Signed)
   09/19/2022  Name: [name] MRN: [id number] DOB: 06-07-1940  Today's TOC FU Call Status: Today's TOC FU Call Status:: Unsuccessful Call (1st Attempt) Unsuccessful Call (1st Attempt) Date: 09/19/22  Attempted to reach the patient regarding the most recent Inpatient/ED visit.  Follow Up Plan: Additional outreach attempts will be made to reach the patient to complete the Transitions of Care (Post Inpatient/ED visit) call.      Antionette Fairy, RN,BSN,CCM [facility] Care Management Care Management Community Coordinator Direct Phone: [phone] Toll Free: [phone] Fax: [phone]

## 2022-09-21 ENCOUNTER — Telehealth: Payer: Self-pay

## 2022-09-21 ENCOUNTER — Encounter: Payer: Self-pay | Admitting: Family

## 2022-09-21 ENCOUNTER — Ambulatory Visit (INDEPENDENT_AMBULATORY_CARE_PROVIDER_SITE_OTHER): Payer: Medicare PPO | Admitting: Family

## 2022-09-21 VITALS — BP 110/88 | HR 68 | Temp 97.5°F | Resp 18 | Ht 71.0 in | Wt 153.6 lb

## 2022-09-21 DIAGNOSIS — I5022 Chronic systolic (congestive) heart failure: Secondary | ICD-10-CM

## 2022-09-21 DIAGNOSIS — I10 Essential (primary) hypertension: Secondary | ICD-10-CM

## 2022-09-21 DIAGNOSIS — E782 Mixed hyperlipidemia: Secondary | ICD-10-CM | POA: Diagnosis not present

## 2022-09-21 DIAGNOSIS — K5901 Slow transit constipation: Secondary | ICD-10-CM | POA: Diagnosis not present

## 2022-09-21 DIAGNOSIS — N400 Enlarged prostate without lower urinary tract symptoms: Secondary | ICD-10-CM

## 2022-09-21 DIAGNOSIS — I25810 Atherosclerosis of coronary artery bypass graft(s) without angina pectoris: Secondary | ICD-10-CM

## 2022-09-21 NOTE — Transitions of Care (Post Inpatient/ED Visit) (Signed)
   09/21/2022  Name: Saaid Youngblut MRN: [id number] DOB: February 24, 1940  Today's TOC FU Call Status: Today's TOC FU Call Status:: Unsuccessful Call (2nd Attempt) Unsuccessful Call (2nd Attempt) Date: 09/21/22  Attempted to reach the patient regarding the most recent Inpatient/ED visit.  Follow Up Plan: Additional outreach attempts will be made to reach the patient to complete the Transitions of Care (Post Inpatient/ED visit) call.     Antionette Fairy, RN,BSN,CCM [facility] Care Management Care Management Community Coordinator Direct Phone: [phone] Toll Free: [phone] Fax: [phone]

## 2022-09-21 NOTE — Transitions of Care (Post Inpatient/ED Visit) (Unsigned)
   09/21/2022  Name: [name] MRN: [id number] DOB: [birth year]  Today's TOC FU Call Status: Today's TOC FU Call Status:: Unsuccessful Call (3rd Attempt) Unsuccessful Call (3rd Attempt) Date: 09/21/22  Attempted to reach the patient regarding the most recent Inpatient/ED visit.  Follow Up Plan: No further outreach attempts will be made at this time. We have been unable to contact the patient.     Antionette Fairy, RN,BSN,CCM [facility] Care Management Care Management Community Coordinator Direct Phone: [phone] Toll Free: [phone] Fax: [phone]

## 2022-09-21 NOTE — Progress Notes (Signed)
Provider: Richarda Blade FNP-C   , Donalee Citrin, NP  Patient Care Team: , Donalee Citrin, NP as PCP - General (Family Medicine) Cardiovascular, [facility]  Extended Emergency Contact Information Primary Emergency Contact: Blew,Johnny Address: [address]          [place], [place] [address] Darden Amber of [place] Home Phone: [phone] Mobile Phone: [phone] Relation: Son Secondary Emergency Contact: cole,sue Address: [address]          [place], [place] [address] D[place] of [place] Phone: [phone] Relation: Friend  Code Status:  Full Code  Goals of care: Advanced Directive information    09/21/2022    8:42 AM  Advanced Directives  Does Patient Have a Medical Advance Directive? Yes  Type of Estate agent of [place];Living will  Does patient want to make changes to medical advance directive? No - Patient declined  Copy of Healthcare Power of Attorney in Chart? Yes - validated most recent copy scanned in chart (See row information)     Chief Complaint  Patient presents with   Follow-up    6 mths chronic issues   Quality Metric Gaps    Needs Covid and Influenza vaccine.    HPI:  Pt is a 82 y.o. 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    He is status post hospitalization from 09/13/2022 - 09/17/2022 progressive weakness.He was found to have leukopenia with anemia.WBC 0.8 improved to 3.1Hematologist was consulted thought due to amoxicillin vs rocephin.ID recommended alternative antibiotics cefadroxil 500 mg twice daily lifelong.Had biopsy done. His hgb dropped 7.9 had Ferric  gluconate x 3 doses given then switched to oral iron.Na+ was low 128 but improved to 132.His Potassium was 3.4< 3.1 then improved to 3.7  Also had mild transaminitis Elevated AST 84,ALT 67 improved on discharge.  Albumin 2.4 improved to 2.6 TP 5.9 and 5.7 on discharge.  He was discharge to follow up with PCP and Hematology/oncologist.  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  Was treated with Valtrex for 10 days.  He denies any acute issues this visit.  Following up with Physical Therapy [facility] once a week. States feeling much better with generalized weakness.   Hyperlipidemia - LDL 161  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    HFrEF (heart failure with reduced ejection fraction) (HCC)    Hypertension    Moderate mitral regurgitation    Severe aortic stenosis    Past Surgical History:  Procedure Laterality Date   AORTIC VALVE REPLACEMENT N/A 07/24/2017   Procedure: AORTIC VALVE REPLACEMENT (AVR) using 23mm Inspiris Aortic Valve;  Surgeon: Alleen Borne, MD;  Location: [facility] OR;  Service: Open Heart Surgery;  Laterality: N/A;   CLIPPING OF ATRIAL APPENDAGE N/A 07/24/2017   Procedure: CLIPPING OF ATRIAL APPENDAGE using a 50mm Atricure clip;  Surgeon: Alleen Borne, MD;  Location: [facility] OR;  Service: Open Heart Surgery;  Laterality: N/A;   CORONARY ARTERY BYPASS GRAFT N/A 07/24/2017  Procedure: CORONARY ARTERY BYPASS GRAFTING (CABG) times 3 using the left greater saphenous vein harvested endoscopically and left internal mammary artery.;  Surgeon: Alleen Borne, MD;  Location: [facility] OR;  Service: Open Heart Surgery;  Laterality: N/A;   EYE SURGERY Bilateral    lens replacements for cataracts   HERNIA REPAIR     INGUINAL HERNIA REPAIR Right 05/06/2015   Procedure: LAPAROSCOPIC RIGHT INGUINAL HERNIA WITH MESH;  Surgeon: Abigail Miyamoto, MD;  Location: [facility] ORS;  Service: General;  Laterality: Right;   INSERTION OF MESH Right 05/06/2015   Procedure: INSERTION OF MESH;  Surgeon: Abigail Miyamoto, MD;  Location: [facility] ORS;  Service: General;  Laterality: Right;   IR BONE MARROW BIOPSY & ASPIRATION  09/15/2022   RIGHT/LEFT HEART CATH AND CORONARY ANGIOGRAPHY N/A 07/10/2017   Procedure: RIGHT/LEFT HEART CATH AND CORONARY ANGIOGRAPHY;  Surgeon: Elder Negus, MD;  Location: [facility] INVASIVE CV LAB;  Service: Cardiovascular;  Laterality: N/A;   TEE WITHOUT CARDIOVERSION N/A 07/11/2017   Procedure: TRANSESOPHAGEAL ECHOCARDIOGRAM (TEE);  Surgeon: Elder Negus, MD;  Location: [facility] ENDOSCOPY;  Service: Cardiovascular;  Laterality: N/A;   TEE WITHOUT CARDIOVERSION N/A 07/24/2017   Procedure: TRANSESOPHAGEAL ECHOCARDIOGRAM (TEE);  Surgeon: Alleen Borne, MD;  Location: [facility] OR;  Service: Open Heart Surgery;  Laterality: N/A;   TEE WITHOUT CARDIOVERSION N/A 08/01/2022   Procedure: TRANSESOPHAGEAL ECHOCARDIOGRAM;  Surgeon: Lewayne Bunting, MD;  Location: [facility] INVASIVE CV LAB;  Service: Cardiovascular;  Laterality: N/A;   TOTAL KNEE ARTHROPLASTY Right 08/07/2019   Procedure: TOTAL KNEE ARTHROPLASTY;  Surgeon: Ollen Gross, MD;  Location: [facility] ORS;  Service: Orthopedics;  Laterality: Right;   ULTRASOUND GUIDANCE FOR VASCULAR ACCESS  07/10/2017   Procedure: Ultrasound Guidance For Vascular Access;  Surgeon: Elder Negus, MD;  Location: [facility] INVASIVE CV LAB;  Service: Cardiovascular;;    Allergies  Allergen Reactions   Sulfamethoxazole-Trimethoprim Rash    Allergies as of 09/21/2022       Reactions   Sulfamethoxazole-trimethoprim Rash        Medication List        Accurate as of September 21, 2022  9:52 AM. If you have any questions, ask your nurse or doctor.          acetaminophen 500 MG tablet Commonly known as: TYLENOL Take 2 tablets (1,000 mg total) by mouth every 6 (six) hours as needed for mild pain or fever.    ascorbic acid 500 MG tablet Commonly known as: VITAMIN C TAKE 1 TABLET (500 MG TOTAL) BY MOUTH DAILY.   aspirin EC 81 MG tablet Take 81 mg by mouth daily. Swallow whole.   atorvastatin 40 MG tablet Commonly known as: LIPITOR Take 1 tablet (40 mg total) by mouth daily.   cefadroxil 500 MG capsule Commonly known as: DURICEF Take 2 capsules (1,000 mg total) by mouth 2 (two) times daily.   cetirizine 10 MG tablet Commonly known as: ZYRTEC Take 10 mg by mouth daily.   ferrous gluconate 324 MG tablet Commonly known as: FERGON Take 1 tablet (324 mg total) by mouth daily with breakfast.   Fish Oil 1000 MG Caps Take 2 capsules by mouth daily.   metoprolol succinate 25 MG 24 hr tablet Commonly known as: TOPROL-XL Take 0.5 tablets (12.5 mg total) by mouth daily. Take with or immediately following a meal. What changed: how much to take   MULTIVITAMIN ADULT PO Take 1 tablet by mouth daily.   Myrbetriq 25 MG Tb24 tablet Generic drug: mirabegron ER  Take 25 mg by mouth daily.   polyethylene glycol 17 g packet Commonly known as: MiraLax Take 17 g by mouth daily.   tamsulosin 0.4 MG Caps capsule Commonly known as: FLOMAX Take 1 capsule (0.4 mg total) by mouth daily.   torsemide 10 MG tablet Commonly known as: DEMADEX TAKE 1 TABLET BY MOUTH TWICE A DAY   valACYclovir 1000 MG tablet Commonly known as: VALTREX Take 1 tablet (1,000 mg total) by mouth 2 (two) times daily for 17 doses.        Review of Systems  Constitutional:  Negative for appetite change, chills, fatigue, fever and unexpected weight change.  HENT:  Positive for hearing loss. Negative for congestion, dental problem, ear discharge, ear pain, facial swelling, nosebleeds, postnasal drip, rhinorrhea, sinus pressure, sinus pain, sneezing, sore throat, tinnitus and trouble swallowing.   Eyes:  Positive for visual disturbance. Negative for pain, discharge, redness and itching.       Wears eye glasses   Respiratory:   Negative for cough, chest tightness, shortness of breath and wheezing.   Cardiovascular:  Negative for chest pain, palpitations and leg swelling.  Gastrointestinal:  Positive for constipation. Negative for abdominal distention, abdominal pain, blood in stool, diarrhea, nausea and vomiting.       Miralax and metamucil effective   Endocrine: Negative for cold intolerance, heat intolerance, polydipsia, polyphagia and polyuria.  Genitourinary:  Negative for difficulty urinating, dysuria, flank pain, frequency and urgency.  Musculoskeletal:  Positive for gait problem. Negative for arthralgias, back pain, joint swelling, myalgias, neck pain and neck stiffness.  Skin:  Negative for color change, pallor, rash and wound.  Neurological:  Negative for dizziness, syncope, speech difficulty, weakness, light-headedness, numbness and headaches.  Hematological:  Does not bruise/bleed easily.  Psychiatric/Behavioral:  Negative for agitation, behavioral problems, confusion, hallucinations, self-injury, sleep disturbance and suicidal ideas. The patient is not nervous/anxious.     Immunization History  Administered Date(s) Administered   Fluad Quad(high Dose 65+) 01/18/2022   Influenza, High Dose Seasonal PF 11/24/2018   Influenza-Unspecified 11/24/2018   Moderna Sars-Covid-2 Vaccination 03/15/2019, 03/15/2019, 04/12/2019, 04/12/2019   PNEUMOCOCCAL CONJUGATE-20 01/18/2022   Tdap 02/11/2013, 01/18/2019   Zoster Recombinant(Shingrix) 02/26/2019, 05/20/2019   Pertinent  Health Maintenance Due  Topic Date Due   INFLUENZA VACCINE  09/08/2022      03/23/2022    9:48 AM 06/03/2022   10:07 AM 07/19/2022    3:40 PM 08/30/2022   10:10 AM 09/12/2022    2:02 PM  Fall Risk  Falls in the past year? 0 0 0 0 0  Was there an injury with Fall? 0 0 0 0 0  Fall Risk Category Calculator 0 0 0 0 0  Patient at Risk for Falls Due to No Fall Risks History of fall(s);No Fall Risks History of fall(s);No Fall Risks  No Fall Risks   Fall risk Follow up Falls evaluation completed  Falls evaluation completed  Falls evaluation completed   Functional Status Survey:    Vitals:   09/21/22 0841  Height: 5\' 11"  (1.803 m)   Body mass index is 21.62 kg/m. Physical Exam Vitals reviewed.  Constitutional:      General: He is not in acute distress.    Appearance: Normal appearance. He is normal weight. He is not ill-appearing or diaphoretic.  HENT:     Head: Normocephalic.     Right Ear: Tympanic membrane, ear canal and external ear normal. There is no impacted cerumen.     Left Ear: Tympanic membrane,  ear canal and external ear normal. There is no impacted cerumen.     Nose: Nose normal. No congestion or rhinorrhea.     Mouth/Throat:     Mouth: Mucous membranes are moist.     Pharynx: Oropharynx is clear. No oropharyngeal exudate or posterior oropharyngeal erythema.  Eyes:     General: No scleral icterus.       Right eye: No discharge.        Left eye: No discharge.     Extraocular Movements: Extraocular movements intact.     Conjunctiva/sclera: Conjunctivae normal.     Pupils: Pupils are equal, round, and reactive to light.  Neck:     Vascular: No carotid bruit.  Cardiovascular:     Rate and Rhythm: Normal rate and regular rhythm.     Pulses: Normal pulses.     Heart sounds: Normal heart sounds. No murmur heard.    No friction rub. No gallop.  Pulmonary:     Effort: Pulmonary effort is normal. No respiratory distress.     Breath sounds: Normal breath sounds. No wheezing, rhonchi or rales.  Chest:     Chest wall: No tenderness.  Abdominal:     General: Bowel sounds are normal. There is no distension.     Palpations: Abdomen is soft. There is no mass.     Tenderness: There is no abdominal tenderness. There is no right CVA tenderness, left CVA tenderness, guarding or rebound.  Musculoskeletal:        General: No swelling or tenderness. Normal range of motion.     Cervical back: Normal range of motion. No  rigidity or tenderness.     Right lower leg: No edema.     Left lower leg: No edema.  Lymphadenopathy:     Cervical: No cervical adenopathy.  Skin:    General: Skin is warm and dry.     Coloration: Skin is not pale.     Findings: No bruising, erythema, lesion or rash.  Neurological:     Mental Status: He is alert and oriented to person, place, and time.     Cranial Nerves: No cranial nerve deficit.     Sensory: No sensory deficit.     Motor: No weakness.     Coordination: Coordination normal.     Gait: Gait abnormal.  Psychiatric:        Mood and Affect: Mood normal.        Speech: Speech normal.        Behavior: Behavior normal.        Thought Content: Thought content normal.        Judgment: Judgment normal.    Labs reviewed: Recent Labs    07/19/22 1656 07/28/22 0209 07/30/22 0915 08/02/22 0931 09/13/22 1153 09/14/22 0515 09/15/22 0654 09/16/22 0629 09/17/22 0631  NA 133*   < > 131*   < > 129*   < > 130* 132* 132*  K 5.1   < > 3.8   < > 3.4*   < > 3.7 3.9 3.7  CL 97*   < > 94*   < > 94*   < > 95* 94* 96*  CO2 28   < > 28   < > 25   < > 24 29 25   GLUCOSE 114   < > 113*   < > 112*   < > 92 109* 97  BUN 24   < > 27*   < > 14   < > 10  10 11  CREATININE 1.04   < > 1.01   < > 0.82   < > 0.80 0.85 0.82  CALCIUM 8.7   < > 8.7*   < > 8.4*   < > 8.6* 8.9 8.7*  MG 2.0  --  1.9  --  1.9  --   --   --   --   PHOS  --   --  2.9  --   --   --   --   --   --    < > = values in this interval not displayed.   Recent Labs    09/15/22 0654 09/16/22 0629 09/17/22 0631  AST 73* 43* 22  ALT 74* 62* 47*  ALKPHOS 42 46 48  BILITOT 0.7 0.4 0.6  PROT 5.4* 5.6* 5.7*  ALBUMIN 2.5* 2.5* 2.6*   Recent Labs    09/15/22 0654 09/16/22 0629 09/17/22 0631  WBC 1.6* 2.5* 3.1*  NEUTROABS 1.0* 1.4* 1.9  HGB 8.0* 7.7* 8.3*  HCT 25.8* 24.7* 26.4*  MCV 94.5 93.2 95.0  PLT 267 264 282   Lab Results  Component Value Date   TSH 1.124 09/13/2022   Lab Results  Component Value Date    HGBA1C 5.7 (H) 07/20/2017   Lab Results  Component Value Date   CHOL 212 (H) 08/03/2022   HDL 33 (L) 08/03/2022   LDLCALC 161 (H) 08/03/2022   LDLDIRECT 155 (H) 08/03/2022   TRIG 90 08/03/2022   CHOLHDL 6.4 08/03/2022    Significant Diagnostic Results in last 30 days:  IR BONE MARROW BIOPSY & ASPIRATION  Result Date: 09/15/2022 INDICATION: 82 year old with anemia and leukopenia. EXAM: FLUOROSCOPIC GUIDED BONE MARROW ASPIRATES AND BIOPSY Physician: Rachelle Hora. Lowella Dandy, MD MEDICATIONS: Fentanyl 50 mcg ANESTHESIA/SEDATION: Patient's level of consciousness and vital signs were monitored continuously by radiology nurse throughout the procedure under the supervision of the provider performing the procedure. COMPLICATIONS: None immediate. PROCEDURE: The procedure was explained to the patient. The risks and benefits of the procedure were discussed and the patient's questions were addressed. Informed consent was obtained from the patient. The patient was placed prone on interventional table. The back was prepped and draped in sterile fashion. Maximal barrier sterile technique was utilized including caps, mask, sterile gowns, sterile gloves, sterile drape, hand hygiene and skin antiseptic. The skin and right posterior ilium were anesthetized with 1% lidocaine. 11 gauge bone needle was directed into the right ilium with CT guidance. Two aspirates and one core biopsy were obtained. Bandage placed over the puncture site. IMPRESSION: Fluoroscopic guided bone marrow aspiration and core biopsy. Electronically Signed   By: Richarda Overlie M.D.   On: 09/15/2022 09:57   DG Chest Portable 1 View  Result Date: 09/13/2022 CLINICAL DATA:  Weakness EXAM: PORTABLE CHEST 1 VIEW COMPARISON:  07/28/2022 FINDINGS: Stable heart size status post sternotomy, CABG, cardiac valve replacement, and left atrial appendage clipping. Aortic atherosclerosis. No focal airspace consolidation, pleural effusion, or pneumothorax. IMPRESSION: No active  disease. Electronically Signed   By: Duanne Guess D.O.   On: 09/13/2022 12:08   CT HEAD WO CONTRAST ( )  Result Date: 09/13/2022 CLINICAL DATA:  82 year old male with weakness and difficulty swallowing. EXAM: CT HEAD WITHOUT CONTRAST TECHNIQUE: Contiguous axial images were obtained from the base of the skull through the vertex without intravenous contrast. RADIATION DOSE REDUCTION: This exam was performed according to the departmental dose-optimization program which includes automated exposure control, adjustment of the mA and/or kV according to patient size  and/or use of iterative reconstruction technique. COMPARISON:  None Available. FINDINGS: Brain: Cerebral volume is within normal limits for age. No midline shift, ventriculomegaly, mass effect, evidence of mass lesion, intracranial hemorrhage or evidence of cortically based acute infarction. Gray-white differentiation is within normal limits for age. No encephalomalacia is identified. Vascular: No suspicious intracranial vascular hyperdensity. Calcified atherosclerosis at the skull base. Skull: Intact, negative. Sinuses/Orbits: Visualized paranasal sinuses and mastoids are well aerated. Mild ethmoid and right maxillary mucosal thickening. Other: No acute orbit or scalp soft tissue finding. IMPRESSION: Normal for age noncontrast CT appearance of the Brain. Electronically Signed   By: Odessa Fleming M.D.   On: 09/13/2022 11:47    Assessment/Plan 1. Essential hypertension Blood pressure stable -Continue on metoprolol and torsemide - CBC with Differential/Platelet - COMPLETE METABOLIC PANEL WITH GFR  2. Mixed hyperlipidemia Previous LDL not at goal Continue on atorvastatin  3. Coronary artery disease involving coronary bypass graft of native heart without angina pectoris Continue on atorvastatin and aspirin -Continue to follow-up with a cardiologist  4. Benign prostatic hyperplasia without lower urinary tract symptoms Asymptomatic -Continue on  tamsulosin  5. Slow transit constipation MiraLAX and Metamucil effective -Encouraged to increase fiber in the diet -Encourage hydration  6. Chronic systolic heart failure (HCC) No leg edema or shortness of breath on exam Recent BNP 896 improved  ECHO EF 35 - 40 % with global hypokinesis left ventricular diastolic function could not be evaluated. -Continue to follow-up with a cardiologist Continue on torsemide  Family/ staff Communication: Reviewed plan of care with patient verbalized understanding  Labs/tests ordered:  - CBC with Differential/Platelet - COMPLETE METABOLIC PANEL WITH GFR  Next Appointment : Return in about 6 months (around 03/24/2023) for medical mangement of chronic issues.Caesar Bookman, NP

## 2022-09-22 ENCOUNTER — Other Ambulatory Visit: Payer: Self-pay

## 2022-09-22 ENCOUNTER — Encounter: Payer: Self-pay | Admitting: Infectious Diseases

## 2022-09-22 ENCOUNTER — Telehealth: Payer: Self-pay

## 2022-09-22 ENCOUNTER — Other Ambulatory Visit: Payer: Self-pay | Admitting: Family

## 2022-09-22 ENCOUNTER — Ambulatory Visit: Payer: Medicare PPO | Admitting: Infectious Diseases

## 2022-09-22 VITALS — BP 127/72 | HR 77 | Resp 16 | Ht 71.0 in | Wt 152.2 lb

## 2022-09-22 DIAGNOSIS — I110 Hypertensive heart disease with heart failure: Secondary | ICD-10-CM | POA: Diagnosis not present

## 2022-09-22 DIAGNOSIS — S51801D Unspecified open wound of right forearm, subsequent encounter: Secondary | ICD-10-CM | POA: Diagnosis not present

## 2022-09-22 DIAGNOSIS — T826XXD Infection and inflammatory reaction due to cardiac valve prosthesis, subsequent encounter: Secondary | ICD-10-CM | POA: Diagnosis not present

## 2022-09-22 DIAGNOSIS — R7881 Bacteremia: Secondary | ICD-10-CM | POA: Diagnosis not present

## 2022-09-22 DIAGNOSIS — I5033 Acute on chronic diastolic (congestive) heart failure: Secondary | ICD-10-CM | POA: Diagnosis not present

## 2022-09-22 DIAGNOSIS — T826XXA Infection and inflammatory reaction due to cardiac valve prosthesis, initial encounter: Secondary | ICD-10-CM | POA: Diagnosis not present

## 2022-09-22 DIAGNOSIS — Z79899 Other long term (current) drug therapy: Secondary | ICD-10-CM

## 2022-09-22 DIAGNOSIS — I38 Endocarditis, valve unspecified: Secondary | ICD-10-CM

## 2022-09-22 DIAGNOSIS — D708 Other neutropenia: Secondary | ICD-10-CM | POA: Diagnosis not present

## 2022-09-22 DIAGNOSIS — B955 Unspecified streptococcus as the cause of diseases classified elsewhere: Secondary | ICD-10-CM | POA: Diagnosis not present

## 2022-09-22 DIAGNOSIS — J189 Pneumonia, unspecified organism: Secondary | ICD-10-CM | POA: Diagnosis not present

## 2022-09-22 DIAGNOSIS — J9601 Acute respiratory failure with hypoxia: Secondary | ICD-10-CM | POA: Diagnosis not present

## 2022-09-22 DIAGNOSIS — I5021 Acute systolic (congestive) heart failure: Secondary | ICD-10-CM | POA: Diagnosis not present

## 2022-09-22 LAB — COMPLETE METABOLIC PANEL WITH GFR
AG Ratio: 1.1 (calc) (ref 1.0–2.5)
ALT: 24 U/L (ref 9–46)
AST: 14 U/L (ref 10–35)
Albumin: 3.8 g/dL (ref 3.6–5.1)
Alkaline phosphatase (APISO): 56 U/L (ref 35–144)
BUN: 11 mg/dL (ref 7–25)
CO2: 28 mmol/L (ref 20–32)
Calcium: 9.8 mg/dL (ref 8.6–10.3)
Chloride: 97 mmol/L — ABNORMAL LOW (ref 98–110)
Creat: 0.99 mg/dL (ref 0.70–1.22)
Globulin: 3.4 g/dL (ref 1.9–3.7)
Glucose, Bld: 93 mg/dL (ref 65–139)
Potassium: 4.6 mmol/L (ref 3.5–5.3)
Sodium: 134 mmol/L — ABNORMAL LOW (ref 135–146)
Total Bilirubin: 0.3 mg/dL (ref 0.2–1.2)
Total Protein: 7.2 g/dL (ref 6.1–8.1)
eGFR: 77 mL/min/{1.73_m2} (ref >=60)

## 2022-09-22 LAB — CBC WITH DIFFERENTIAL/PLATELET
Absolute Monocytes: 722 {cells}/uL (ref 200–950)
Basophils Absolute: 59 {cells}/uL (ref 0–200)
Basophils Relative: 0.7 %
Eosinophils Absolute: 0 {cells}/uL — ABNORMAL LOW (ref 15–500)
Eosinophils Relative: 0 %
HCT: 30.6 % — ABNORMAL LOW (ref 38.5–50.0)
Hemoglobin: 9.7 g/dL — ABNORMAL LOW (ref 13.2–17.1)
Lymphs Abs: 823 {cells}/uL — ABNORMAL LOW (ref 850–3900)
MCH: 29 pg (ref 27.0–33.0)
MCHC: 31.7 g/dL — ABNORMAL LOW (ref 32.0–36.0)
MCV: 91.3 fL (ref 80.0–100.0)
MPV: 9.6 fL (ref 7.5–12.5)
Monocytes Relative: 8.6 %
Neutro Abs: 6796 {cells}/uL (ref 1500–7800)
Neutrophils Relative %: 80.9 %
Platelets: 338 10*3/uL (ref 140–400)
RBC: 3.35 10*6/uL — ABNORMAL LOW (ref 4.20–5.80)
RDW: 15.7 % — ABNORMAL HIGH (ref 11.0–15.0)
Total Lymphocyte: 9.8 %
WBC: 8.4 10*3/uL (ref 3.8–10.8)

## 2022-09-22 NOTE — Progress Notes (Addendum)
Subjective:   [name], male    DOB: [birth year], 82 y.o.   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  He was diuresed, treated with IV Rocephin, discharged with PICC line with plans for 6 weeks of antibiotics.  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 At subsequent follow up visit, we stopped metoprolol, Jardiance, torsemide, losartan due to hypotension.  Subsequently, torsemide was resumed for leg edema symptoms.  More recently, patient was hospitalized in 09/2022 with leukopenia and generalized weakness.  This was thought to be possibly related to amoxicillin or ceftriaxone use.  ID were consulted for alternate antibiotics, recommended cefadroxil 500 mg twice daily lifelong.  Bone biopsy on 09/15/2022 resulted the following:  -No significant CD34 positive blastic population identified  -No monoclonal B-cell population or significant T-cell abnormalities identified   He has had good recovery since then.  He is ambulatory without any assistance.  Leg edema is completely resolved.  He does not have any overt exertional dyspnea symptoms on metoprolol alone.  Reviewed recent echocardiogram results with the patient, details below.  Briefly, EF is mildly improved.  There is persistent presence of vegetation on aortic valve on aortic side, without any aortic regurgitation, and moderate stenosis of prosthetic aortic valve.     Current Outpatient Medications:     acetaminophen (TYLENOL) 500 MG tablet, Take 2 tablets (1,000 mg total) by mouth every 6 (six) hours as needed for mild pain or fever., Disp: 30 tablet, Rfl: 0   ascorbic acid (VITAMIN C) 500 MG tablet, TAKE 1 TABLET (500 MG TOTAL) BY MOUTH DAILY., Disp: 90 tablet, Rfl: 3   aspirin EC 81 MG tablet, Take 81 mg by mouth daily. Swallow whole., Disp: , Rfl:    atorvastatin (LIPITOR) 40 MG tablet, TAKE 1 TABLET BY MOUTH EVERY DAY, Disp: 90 tablet, Rfl: 1   cefadroxil (DURICEF) 500 MG capsule, Take 2 capsules (1,000 mg total) by mouth 2 (two) times daily., Disp: 360 capsule, Rfl: 0   cetirizine (ZYRTEC) 10 MG tablet, Take 10 mg by mouth daily., Disp: , Rfl:    ferrous gluconate (FERGON) 324 MG tablet, Take 1 tablet (324 mg total) by mouth daily with breakfast., Disp: 30 tablet, Rfl: 0   metoprolol succinate (TOPROL-XL) 25 MG 24 hr tablet, Take 0.5 tablets (12.5 mg total) by mouth daily. Take with or immediately following a meal. (Patient taking differently: Take 25 mg by mouth daily. Take with or immediately following a meal.), Disp: 30 tablet, Rfl: 3   Multiple Vitamin (MULTIVITAMIN ADULT PO), Take 1 tablet by mouth daily., Disp: , Rfl:    MYRBETRIQ 25 MG TB24 tablet, Take 25 mg by mouth daily., Disp: , Rfl:    Omega-3 Fatty Acids (FISH OIL) 1000 MG CAPS, Take 2 capsules by mouth daily., Disp: , Rfl:    polyethylene glycol (MIRALAX) 17 g packet, Take 17 g by mouth daily., Disp: , Rfl:    tamsulosin (FLOMAX) 0.4 MG CAPS capsule, Take 1 capsule (0.4 mg total) by mouth daily.,  Disp: 30 capsule, Rfl: 0   torsemide (DEMADEX) 10 MG tablet, TAKE 1 TABLET BY MOUTH TWICE A DAY, Disp: 180 tablet, Rfl: 1   valACYclovir (VALTREX) 1000 MG tablet, Take 1 tablet (1,000 mg total) by mouth 2 (two) times daily for 17 doses., Disp: 17 tablet, Rfl: 0   Cardiovascular studies:  EKG 09/23/2022: Sinus rhythm 72 bpm  Frequent PACs First degree AV block Right bundle branch block    Echocardiogram 09/19/2022: Left  ventricle cavity is normal in size. Mild concentric hypertrophy of the left ventricle. Mild global hypokinesis. LVEF around 50%. Doppler evidence of grade II (pseudonormal) diastolic dysfunction, elevated LAP.  Left atrial cavity is severely dilated. Prosthetic23 mm Edwards inspiris resilia pericardial valve. Mobile echodense structure attached on aortic side of the valve, consistent with vegetation. Moderate aortic stenosis. Vmax 2.4 m/sec, mean PG 11 mmHg, AVA 1.2 cm by continuity equation. No aortic valve regurgitation. Structurally normal mitral valve. Moderate (Grade II) mitral regurgitation. Mild to moderate tricuspid regurgitation. No evidence of pulmonary hypertension. Compared to previous studies in [address]. EF has improved from 35-40%. Aortic vegetation remains.  TEE 08/01/2022:  1. Oscillating density on prosthetic aortic valve consistent with  vegetation.   2. Left ventricular ejection fraction, by estimation, is 45 to 50%. The  left ventricle has mildly decreased function. The left ventricle  demonstrates global hypokinesis.   3. Right ventricular systolic function is normal. The right ventricular  size is normal.   4. LAA known to be ligated. Left atrial size was severely dilated.   5. Right atrial size was mildly dilated.   6. The mitral valve is normal in structure. Moderate mitral valve  regurgitation.   7. The aortic valve has been repaired/replaced. Aortic valve  regurgitation is not visualized. Moderate aortic valve stenosis. There is  a 23 mm Edwards inspiris resilia pericardial valve present in the aortic  position.   8. There is mild (Grade II) plaque involving the descending aorta.   Mobile cardiac telemetry 13 days 03/01/2021 - 03/15/2021: Dominant rhythm: Sinus. HR 42-125 bpm. Avg HR 71 bpm, in sinus rhythm. 15 episodes of SVT/probable AT, fastest at 148 bpm for 6 beats, longest for 19 beats at 96 bpm. 7.4% isolated SVE, <1% couplet/triplets. 4 episodes of VT,  fastest at 144 bpm for 4 bpm, longest for 12 beats at 102 bpm. Some episodes of VT may be SVT with possible aberrancy. 8.3% isolated VE, <1% couplet/triplets. 1 episode of sinus pause 3.1 sec at 12:11 AM w/no reported symptoms.  No atrial fibrillation/atrial flutter/VT/high grade AV block, noted. 1 patient triggered event, correlated w/VE.  Echocardiogram 03/16/2021: Left ventricle cavity is normal in size and wall thickness. Abnormal septal wall motion due to post-operative valve. Normal LV systolic function with visual EF 50-55%. Doppler evidence of grade II (pseudonormal) diastolic dysfunction, elevated LAP.  Left atrial cavity is moderately dilated. Well seated bioprosthetic aortic valve. Mildly elevated mean PG 13 mmHg. No aortic valve regurgitation. Mild mitral valve leaflet thickening. Moderate (Grade II) mitral regurgitation. Mild tricuspid regurgitation.  Mild pulmonic regurgitation. No evidence of pulmonary hypertension. No significant change compared to previous study on 02/21/2020.  Cardiac surgery 07/24/2017: CABG (LIMA-LAD, SVG- dLCx, SVG-RCA) Aortic valve replacement 23 mm Edwards Inspiris Resilia pericardial valve LAA clipping  Vascular ultrasound 07/11/2017: Bilateral carotid mild stenosis 1-39% Normal bilateral ABI  Recent labs: 09/21/2022: Glucose 93, BUN/Cr 11/0.99. EGFR 77. Na/K 134/4.6. Rest of the CMP normal H/H 9.7/30.6. MCV 91. Platelets 338 TSH 1.1 normal   Review  of Systems  Constitutional: Negative for malaise/fatigue.  Cardiovascular:  Negative for chest pain, dyspnea on exertion, leg swelling, palpitations and syncope.  Neurological:  Positive for light-headedness.         Today's Vitals   09/23/22 1038  BP: 118/64  Pulse: 79  Resp: 16  SpO2: 99%  Weight: 154 lb (69.9 kg)  Height: 5\' 11"  (1.803 m)       Objective:    Physical Exam Vitals and nursing note reviewed.  Constitutional:      General: He is not in acute distress. Neck:      Vascular: No JVD.  Cardiovascular:     Rate and Rhythm: Normal rate and regular rhythm. Occasional Extrasystoles are present.    Pulses: Normal pulses.     Heart sounds: Murmur heard.     Harsh midsystolic murmur is present with a grade of 2/6 at the upper right sternal border radiating to the neck.  Pulmonary:     Effort: Pulmonary effort is normal.     Breath sounds: Normal breath sounds. No wheezing or rales.  Musculoskeletal:     Right lower leg: No edema.     Left lower leg: No edema.     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 Blood pressure normal today.  Prosthetic valve endocarditis: Vegetation noted on posterior aortic valve, in the setting of Strept sanguinis bacteremia. Cardiothoracic surgery consulted and hospitalization, no addiction for surgery. Currently on cefadroxil 500 mg daily lifelong, as per ID recommendations, after leukopenia thought to be secondary to antibiotics use. Persistent vegetation noted on echocardiogram on 09/19/2022, without any worsening aortic stenosis or aortic regurgitation.  EF has improved to about 50% compared to 07/2022. Continue metoprolol succinate, without any other GDMT for heart failure, as it caused significant orthostatic hypotension in this patient.  HFrEF: EF 35-40% with acute heart failure decompensation in 07/2022. Patient appears euvolemic at this time. Medical management as above.  PAC: Asymptomatic.  Monitor for now.  Hyponatremia: Resolved.  CAD: S/p CABG. no anginal symptoms. Continue aspirin, statin.  F/u in 4 weeks    Elder Negus, MD Pager: [phone] Office: [phone]

## 2022-09-22 NOTE — Progress Notes (Addendum)
Patient Active Problem List   Diagnosis Date Noted   Leukopenia 09/13/2022   PICC (peripherally inserted central catheter) in place 08/30/2022   Hypotension due to hypovolemia 08/09/2022   Pure hypercholesterolemia 08/03/2022   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   Encounter for assessment of peripherally inserted central catheter (PICC) 08/02/2022   Aortic valve endocarditis 08/01/2022   Cardiomyopathy (HCC) 08/01/2022   Chronic HFrEF (heart failure with reduced ejection fraction) (HCC) 08/01/2022   Prosthetic cardiac valve vegetation 08/01/2022   Anemia 08/01/2022   Multifocal pneumonia 08/01/2022   Acute systolic CHF (congestive heart failure) (HCC) 07/30/2022   Sepsis (HCC) 07/29/2022   Streptococcal bacteremia 07/29/2022   HCAP (healthcare-associated pneumonia) 07/29/2022   Acute on chronic diastolic CHF (congestive heart failure) (HCC) 07/29/2022   Aortic atherosclerosis (HCC) 07/29/2022   Medication management 07/29/2022   Bacteremia 07/29/2022   Acute hypoxic respiratory failure (HCC) 07/28/2022   Hyponatremia 06/24/2022   Low back pain 06/24/2022   Constipation 06/24/2022   Renal cyst, 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   PAC (premature atrial contraction) 03/01/2021   PVC (premature ventricular contraction) 03/01/2021   Atrial bigeminy 02/28/2020   OA (osteoarthritis) of knee 08/07/2019   Primary osteoarthritis of right knee 08/07/2019   Coronary artery disease involving coronary bypass graft of native heart without angina pectoris 09/19/2018   Weakness 08/01/2018   Mixed hyperlipidemia 06/06/2018   S/P left atrial appendage ligation 07/25/2017   S/P AVR 07/25/2017   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   Coronary artery disease without angina pectoris 07/12/2017   Mitral regurgitation 07/07/2017   (HFpEF) heart failure with preserved ejection fraction (HCC) 07/07/2017   Gout 02/12/2013   Benign hypertension 02/12/2013   GERD (gastroesophageal reflux disease) 02/12/2013     Patient's Medications  New Prescriptions   No medications on file  Previous Medications   ACETAMINOPHEN (TYLENOL) 500 MG TABLET    Take 2 tablets (1,000 mg total) by mouth every 6 (six) hours as needed for mild pain or fever.   ASCORBIC ACID (VITAMIN C) 500 MG TABLET    TAKE 1 TABLET (500 MG TOTAL) BY MOUTH DAILY.   ASPIRIN EC 81 MG TABLET    Take 81 mg by mouth daily. Swallow whole.   ATORVASTATIN (LIPITOR) 40 MG TABLET    Take 1 tablet (40 mg total) by mouth daily.   CEFADROXIL (DURICEF) 500 MG CAPSULE    Take 2 capsules (1,000 mg total) by mouth 2 (two) times daily.   CETIRIZINE (ZYRTEC) 10 MG TABLET    Take 10 mg by mouth daily.   FERROUS GLUCONATE (FERGON) 324 MG TABLET    Take 1 tablet (324 mg total) by mouth daily with breakfast.   METOPROLOL SUCCINATE (TOPROL-XL) 25 MG 24 HR TABLET    Take 0.5 tablets (12.5 mg total) by mouth daily. Take with or immediately following a meal.   MULTIPLE VITAMIN (MULTIVITAMIN ADULT PO)    Take 1 tablet by mouth daily.   MYRBETRIQ 25 MG TB24 TABLET    Take 25 mg by mouth daily.   OMEGA-3 FATTY ACIDS (FISH OIL) 1000 MG CAPS    Take 2 capsules by mouth daily.   POLYETHYLENE GLYCOL (MIRALAX) 17 G PACKET    Take 17 g by mouth daily.   TAMSULOSIN (FLOMAX) 0.4 MG CAPS CAPSULE    Take 1 capsule (0.4 mg total) by mouth daily.   TORSEMIDE (DEMADEX) 10 MG TABLET    TAKE 1 TABLET BY MOUTH TWICE A DAY   VALACYCLOVIR (  VALTREX) 1000 MG TABLET    Take 1 tablet (1,000 mg total) by mouth 2 (two) times daily for 17 doses.  Modified Medications   No medications on file  Discontinued Medications   No medications on file    Subjective: 82 year old male with multiple co-morbidities including CAD, CHF, moderate MR, severe aortic stenosis s/p CABG, clipping of atrial appendage and prosthetic aortic valve replacement who is here for HFU for Prosthetic AV endocarditis. Patient discharged on 6/26 to complete 6 weeks of IV ceftriaxone.   He has been following Cardiology  since hospital discharge, plan for repeat TTE per cardiology in few weeks.   08/30/22 Getting IV abtx without any concerns related to PICC or abtx. Discussed plan to continue IV abtx until 8/3 to be followed by PO amoxicillin indefinitely. He has no complaints otherwise.   09/22/22 Patient was admitted 8/6-8/10 for progressive weakness and found to have severe leukopenia and anemia. Seen by Hematology as well as ID inpatient and was thought to be related to antibiotics like prolonged IV ceftriaxone. He was switched from PO amoxicillin to cefadroxil in the meantime. S/p BM biopsy  8/8 with no malignancy noted. He was discharged on 8/10 and reports taking cefadroxil as prescribed without missing doses or any concerns. Denies nausea, vomiting and diarrhea. Denies fevers, chills and sweats. He saw PCP yesterday and CBC with leukopenia resolved. He has no other complaints today.   Review of Systems: Denies fevers, chills Denies nausea, vomiting and diarrhea   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    HFrEF (heart failure with reduced ejection fraction) (HCC)    Hypertension    Moderate mitral regurgitation    Severe aortic stenosis    Past Surgical History:  Procedure Laterality Date   AORTIC VALVE REPLACEMENT N/A 07/24/2017   Procedure: AORTIC VALVE REPLACEMENT (AVR) using 23mm Inspiris Aortic Valve;  Surgeon: Alleen Borne, MD;  Location: [facility] OR;  Service: Open Heart Surgery;  Laterality: N/A;   CLIPPING OF ATRIAL APPENDAGE N/A 07/24/2017   Procedure: CLIPPING OF ATRIAL APPENDAGE using a 50mm Atricure clip;  Surgeon: Alleen Borne, MD;  Location: [facility];  Service: Open Heart Surgery;  Laterality: N/A;   CORONARY ARTERY BYPASS GRAFT N/A 07/24/2017   Procedure: CORONARY ARTERY BYPASS GRAFTING (CABG) times 3 using the left greater saphenous vein harvested  endoscopically and left internal mammary artery.;  Surgeon: Alleen Borne, MD;  Location: [facility] OR;  Service: Open Heart Surgery;  Laterality: N/A;   EYE SURGERY Bilateral    lens replacements for cataracts   HERNIA REPAIR     INGUINAL HERNIA REPAIR Right 05/06/2015   Procedure: LAPAROSCOPIC RIGHT INGUINAL HERNIA WITH MESH;  Surgeon: Abigail Miyamoto, MD;  Location: [facility] ORS;  Service: General;  Laterality: Right;   INSERTION OF MESH Right 05/06/2015   Procedure: INSERTION OF MESH;  Surgeon: Abigail Miyamoto, MD;  Location: [facility] ORS;  Service: General;  Laterality: Right;   IR BONE MARROW BIOPSY & ASPIRATION  09/15/2022   RIGHT/LEFT HEART CATH AND CORONARY ANGIOGRAPHY N/A 07/10/2017   Procedure: RIGHT/LEFT HEART CATH AND CORONARY ANGIOGRAPHY;  Surgeon: Elder Negus, MD;  Location: [facility] INVASIVE CV LAB;  Service: Cardiovascular;  Laterality: N/A;   TEE WITHOUT CARDIOVERSION N/A 07/11/2017   Procedure: TRANSESOPHAGEAL ECHOCARDIOGRAM (TEE);  Surgeon: Elder Negus, MD;  Location: [facility] ENDOSCOPY;  Service: Cardiovascular;  Laterality: N/A;   TEE WITHOUT CARDIOVERSION N/A 07/24/2017   Procedure: TRANSESOPHAGEAL ECHOCARDIOGRAM (TEE);  Surgeon: Alleen Borne, MD;  Location: [facility] OR;  Service: Open Heart Surgery;  Laterality: N/A;   TEE WITHOUT CARDIOVERSION N/A 08/01/2022   Procedure: TRANSESOPHAGEAL ECHOCARDIOGRAM;  Surgeon: Lewayne Bunting, MD;  Location: [facility] INVASIVE CV LAB;  Service: Cardiovascular;  Laterality: N/A;   TOTAL KNEE ARTHROPLASTY Right 08/07/2019   Procedure: TOTAL KNEE ARTHROPLASTY;  Surgeon: Ollen Gross, MD;  Location: [facility] ORS;  Service: Orthopedics;  Laterality: Right;   ULTRASOUND GUIDANCE FOR VASCULAR ACCESS  07/10/2017   Procedure: Ultrasound Guidance For Vascular Access;  Surgeon: Elder Negus, MD;  Location: [facility] INVASIVE CV LAB;  Service: Cardiovascular;;     Social History   Tobacco Use   Smoking status: Former    Current packs/day: 0.00    Average packs/day: 1  pack/day for 12.0 years (12.0 ttl pk-yrs)    Types: Cigarettes    Start date: 74    Quit date: 42    Years since quitting: 51.6   Smokeless tobacco: Never   Tobacco comments:    quit 30-40 yrs ago  Vaping Use   Vaping status: Never Used  Substance Use Topics   Alcohol use: Yes    Comment: 1-2 drinks   Drug use: Yes    Types: Marijuana    Family History  Problem Relation Age of Onset   Stroke Mother    Heart attack Father     Allergies  Allergen Reactions   Sulfamethoxazole-Trimethoprim Rash    Health Maintenance  Topic Date Due   COVID-19 Vaccine (5 - 2023-24 season) 10/08/2021   INFLUENZA VACCINE  09/08/2022   Medicare Annual Wellness (AWV)  06/03/2023   DTaP/Tdap/Td (3 - Td or Tdap) 01/17/2029   Pneumonia Vaccine 64+ Years old  Completed   Zoster Vaccines- Shingrix  Completed   HPV VACCINES  Aged Out    Objective: BP 127/72   Pulse 77   Resp 16   Ht 5\' 11"  (1.803 m)   Wt 152 lb 3.2 oz (69 kg)   SpO2 98%   BMI 21.23 kg/m    Physical Exam Constitutional:      Appearance: Normal appearance.  HENT:     Head: Normocephalic and atraumatic.      Mouth: Mucous membranes are moist.  Eyes:    Conjunctiva/sclera: Conjunctivae normal.     Pupils:   Cardiovascular:     Rate and Rhythm: Normal rate and regular rhythm.     Heart sounds:   Pulmonary:     Effort: Pulmonary effort is normal.     Breath sounds:   Abdominal:     General: Non distended     Palpations:   Musculoskeletal:        General: Normal range of motion.   Skin:    General: Skin is warm and dry.     Comments: Rt arm PICC removed   Neurological:     General: grossly non focal     Mental Status: awake, alert and oriented to person, place, and time.   Psychiatric:        Mood and Affect: Mood normal.   Lab Results Lab Results  Component Value Date   WBC 8.4 09/21/2022   HGB 9.7 (L) 09/21/2022   HCT 30.6 (L) 09/21/2022   MCV 91.3 09/21/2022   PLT 338 09/21/2022    Lab  Results  Component Value Date   CREATININE 0.99 09/21/2022   BUN 11 09/21/2022   NA 134 (L) 09/21/2022   K 4.6 09/21/2022   CL 97 (L) 09/21/2022   CO2 28 09/21/2022    Lab Results  Component Value Date   ALT 24 09/21/2022   AST 14 09/21/2022   ALKPHOS 48 09/17/2022   BILITOT 0.3 09/21/2022    Lab Results  Component Value Date   CHOL 212 (H) 08/03/2022   HDL 33 (L) 08/03/2022   LDLCALC 161 (H) 08/03/2022   LDLDIRECT 155 (H) 08/03/2022   TRIG 90 08/03/2022   CHOLHDL 6.4 08/03/2022   No results found for: "LABRPR", "RPRTITER" No results found for: "HIV1RNAQUANT", "HIV1RNAVL", "CD4TABS"  Pathology 09/15/22 DIAGNOSIS:   BONE MARROW, ASPIRATE, CLOT, CORE:  -Hypercellular bone marrow for age with granulocytic hyperplasia  -See comment   PERIPHERAL BLOOD:  -Normocytic-normochromic anemia  -Leukopenia   COMMENT:   The bone marrow is hypercellular for age with granulocytic hyperplasia  associated with left shift.  Significant dyspoiesis or increase in  blastic cells is not identified.  The overall changes are likely  secondary/regenerative in nature in this setting.  Nonetheless,  correlation with cytogenetic studies is recommended.   8/12 TTE  Conclusions: 1. Left ventricle cavity is normal in size. Mild concentric hypertrophy of the left ventricle. Mild global hypokinesis. LVEF around 50% . Doppler evidence of grade II ( pseudonormal) diastolic dysfunction, elevated LAP. 2. Left atrial cavity is severely dilated. 3. Prosthetic23 mm Edwards inspiris resilia pericardial valve. Mobile echodense structure attached on aortic side of the valve, consistent with vegetation. Moderate aortic stenosis. Vmax 2. 4 m/ sec, mean PG 11 mmHg, AVA 1. 2 cm by continuity equation. No aortic valve regurgitation. 4. Structurally normal mitral valve. Moderate ( Grade II) mitral regurgitation. 5. Mild to moderate tricuspid regurgitation. 6. No evidence of pulmonary hypertension. 7. Compared to previous  studies in [address]. EF has improved from 35- 40% . Aortic vegetation remains.  Assessment/Plan 82 year old male with multiple co-morbidities including CAD, CHF, moderate MR, severe aortic stenosis s/p CABG, clipping of atrial appendage and prosthetic aortic valve replacement with    # Strep sanguinis Prosthetic AV endocarditis     TEE 6/24 Oscillating density on prosthetic aortic valve consistent with  vegetation. Moderate AV stenosis. TEE findings has been d/w Dr Laneta Simmers and recommended no surgical intervention    CT Hazy diffuse ground-glass opacities in the lungs bilaterally, greater on the right than on the left, possible edema versus multifocal pneeumonia. Small bilateral pleural effusions with compressive atelectasis. - Completed approx 6 weeks of IV ceftriaxone through 7/30 where IV ceftriaxone was switched to PO amoxicillin for suppression in light of leukopenia.  - PO amoxicllin was switched to PO cefadroxil after patient being admitted with severe leukopenia in 09/2022  - WBC has gradually improved and last CBC 8/15 with Normal WBC count  Plan  Continue cefadroxil 500mg  po bid  Fu in a month to recheck CBC that it stays stable   # Medication monitoring  CBC and BMP 8/14 reviewed and unremarkable  # CHF # Cardiomyopathy  # CAD s/p CABG # Moderate AV stenosis  - TTE 8/12 Mobile  echodense structure attached on aortic side of the valve, consistent with  vegetation. Moderate aortic stenosis. EF has improved from 35- 40% - per Cardiology    I have personally spent 41 minutes involved in face-to-face and non-face-to-face activities for this patient on the day of the visit. Professional time  spent includes the following activities: Preparing to see the patient (review of tests), Obtaining and/or reviewing separately obtained history (admission/discharge record), Performing a medically appropriate examination and/or evaluation , Ordering medications/tests/procedures, referring and  communicating with other health care professionals, Documenting clinical information in the EMR, Independently interpreting results (not separately reported), Communicating results to the patient/family/caregiver, Counseling and educating the patient/family/caregiver and Care coordination (not separately reported).   Victoriano Lain, MD [facility] 09/22/2022, 9:24 AM

## 2022-09-22 NOTE — Transitions of Care (Post Inpatient/ED Visit) (Signed)
   09/22/2022  Name: [name] MRN: [id number] DOB: 1940-07-12  Today's TOC FU Call Status: Today's TOC FU Call Status:: Unsuccessful Call (3rd Attempt) Unsuccessful Call (3rd Attempt) Date: 09/22/22 (Voicemail message received from patient returning RN CM call. Return call placed to pt and no answer.)  Attempted to reach the patient regarding the most recent Inpatient/ED visit.  Follow Up Plan: No further outreach attempts will be made at this time. We have been unable to contact the patient.     Antionette Fairy, RN,BSN,CCM [facility] Care Management Care Management Community Coordinator Direct Phone: [phone] Toll Free: [phone] Fax: [phone]

## 2022-09-23 ENCOUNTER — Encounter: Payer: Self-pay | Admitting: Cardiology

## 2022-09-23 ENCOUNTER — Ambulatory Visit: Payer: Medicare PPO | Admitting: Cardiology

## 2022-09-23 ENCOUNTER — Encounter (HOSPITAL_COMMUNITY): Payer: Self-pay

## 2022-09-23 VITALS — BP 118/64 | HR 79 | Resp 16 | Ht 71.0 in | Wt 154.0 lb

## 2022-09-23 DIAGNOSIS — I5020 Unspecified systolic (congestive) heart failure: Secondary | ICD-10-CM

## 2022-09-23 DIAGNOSIS — I25810 Atherosclerosis of coronary artery bypass graft(s) without angina pectoris: Secondary | ICD-10-CM

## 2022-09-23 DIAGNOSIS — I491 Atrial premature depolarization: Secondary | ICD-10-CM | POA: Diagnosis not present

## 2022-09-28 ENCOUNTER — Encounter: Payer: Self-pay | Admitting: Nurse Practitioner

## 2022-09-28 ENCOUNTER — Inpatient Hospital Stay: Payer: Medicare PPO | Attending: Oncology

## 2022-09-28 ENCOUNTER — Inpatient Hospital Stay: Payer: Medicare PPO | Admitting: Nurse Practitioner

## 2022-09-28 VITALS — BP 126/63 | HR 100 | Temp 98.1°F | Resp 18 | Ht 71.0 in | Wt 155.3 lb

## 2022-09-28 DIAGNOSIS — D649 Anemia, unspecified: Secondary | ICD-10-CM | POA: Insufficient documentation

## 2022-09-28 DIAGNOSIS — D72819 Decreased white blood cell count, unspecified: Secondary | ICD-10-CM | POA: Insufficient documentation

## 2022-09-28 DIAGNOSIS — K769 Liver disease, unspecified: Secondary | ICD-10-CM

## 2022-09-28 LAB — CBC WITH DIFFERENTIAL (CANCER CENTER ONLY)
Abs Immature Granulocytes: 0.04 10*3/uL (ref 0.00–0.07)
Basophils Absolute: 0.1 10*3/uL (ref 0.0–0.1)
Basophils Relative: 1 %
Eosinophils Absolute: 0 10*3/uL (ref 0.0–0.5)
Eosinophils Relative: 0 %
HCT: 29.1 % — ABNORMAL LOW (ref 39.0–52.0)
Hemoglobin: 9.5 g/dL — ABNORMAL LOW (ref 13.0–17.0)
Immature Granulocytes: 1 %
Lymphocytes Relative: 9 %
Lymphs Abs: 0.7 10*3/uL (ref 0.7–4.0)
MCH: 30.3 pg (ref 26.0–34.0)
MCHC: 32.6 g/dL (ref 30.0–36.0)
MCV: 92.7 fL (ref 80.0–100.0)
Monocytes Absolute: 0.5 10*3/uL (ref 0.1–1.0)
Monocytes Relative: 6 %
Neutro Abs: 6.3 10*3/uL (ref 1.7–7.7)
Neutrophils Relative %: 83 %
Platelet Count: 225 10*3/uL (ref 150–400)
RBC: 3.14 MIL/uL — ABNORMAL LOW (ref 4.22–5.81)
RDW: 18.2 % — ABNORMAL HIGH (ref 11.5–15.5)
WBC Count: 7.5 10*3/uL (ref 4.0–10.5)
nRBC: 0 % (ref 0.0–0.2)

## 2022-09-28 LAB — SAMPLE TO BLOOD BANK

## 2022-09-28 NOTE — Progress Notes (Signed)
  [facility] OFFICE PROGRESS NOTE   Diagnosis: Anemia and leukopenia  INTERVAL HISTORY:   [name] returns as scheduled.  He is feeling better.  Weakness has improved.  No fever.  Good appetite.  He denies bleeding.  Objective:  Vital signs in last 24 hours:  Blood pressure 126/63, pulse 100, temperature 98.1 F (36.7 C), temperature source Oral, resp. rate 18, height 5\' 11"  (1.803 m), weight 155 lb 4.8 oz (70.4 kg), SpO2 100%.    HEENT: No thrush or ulcers. Resp: Lungs clear bilaterally. Cardio: Regular rate and rhythm. GI: Abdomen soft and nontender.  No hepatosplenomegaly. Vascular: No leg edema. Neuro: Alert and oriented.   Lab Results:  Lab Results  Component Value Date   WBC 7.5 09/28/2022   HGB 9.5 (L) 09/28/2022   HCT 29.1 (L) 09/28/2022   MCV 92.7 09/28/2022   PLT 225 09/28/2022   NEUTROABS 6.3 09/28/2022    Imaging:  No results found.  Medications: I have reviewed the patient's current medications.  Assessment/Plan: 1.  Severe anemia/leukopenia with severe neutropenia Bone marrow biopsy 09/15/2022-hypercellular bone marrow for age with granulocytic hyperplasia; significant dyspoiesis or increase in blastic cells not identified; overall changes likely secondary/regenerative in nature; flow cytometry with no significant CD34 positive blastic population and no monoclonal B-cell population or significant T-cell abnormalities; cytogenetics normal. 2.  History of vitamin B12 deficiency 3.  Hyponatremia 4.  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  History of CAD 6.  CHF 7.  Gout 8.  Indeterminate posterior right liver and renal lesions on CT 07/28/2022  Disposition: [name] is stable from a hematologic standpoint.  Hemoglobin and white count continue to be improved.  The hemoglobin will hopefully improve further over the next few weeks.  We discussed signs/symptoms suggestive of progressive anemia.  He understands to  contact the office should he develop any of these.  We discussed the indeterminate right liver lesion on CT 07/28/2022.  We are referring him for an MRI to follow-up on this.  We will contact him with the result.  He will return for lab and follow-up in approximately 2 months.  We are available to see him sooner if needed.  Patient seen with Dr. Truett Perna.  Lonna Cobb ANP/GNP-BC   09/28/2022  2:23 PM This was a shared visit with Lonna Cobb.  We reviewed the bone marrow biopsy findings with [name].  The white cell count recovers spontaneously while he was in the hospital.  The bone marrow is consistent with a recovering bone marrow from a toxic insult.  The neutropenia may have been related to antibiotic therapy.  The plan is to continue observation with follow-up of the CBC.  We will refer him for an abdominal MRI to follow-up on the right liver and right renal lesions noted on CT 07/28/2022  Mancel Bale, MD

## 2022-09-30 DIAGNOSIS — I5021 Acute systolic (congestive) heart failure: Secondary | ICD-10-CM | POA: Diagnosis not present

## 2022-09-30 DIAGNOSIS — B955 Unspecified streptococcus as the cause of diseases classified elsewhere: Secondary | ICD-10-CM | POA: Diagnosis not present

## 2022-09-30 DIAGNOSIS — I5033 Acute on chronic diastolic (congestive) heart failure: Secondary | ICD-10-CM | POA: Diagnosis not present

## 2022-09-30 DIAGNOSIS — I110 Hypertensive heart disease with heart failure: Secondary | ICD-10-CM | POA: Diagnosis not present

## 2022-09-30 DIAGNOSIS — R7881 Bacteremia: Secondary | ICD-10-CM | POA: Diagnosis not present

## 2022-09-30 DIAGNOSIS — J189 Pneumonia, unspecified organism: Secondary | ICD-10-CM | POA: Diagnosis not present

## 2022-09-30 DIAGNOSIS — T826XXA Infection and inflammatory reaction due to cardiac valve prosthesis, initial encounter: Secondary | ICD-10-CM | POA: Diagnosis not present

## 2022-09-30 DIAGNOSIS — J9601 Acute respiratory failure with hypoxia: Secondary | ICD-10-CM | POA: Diagnosis not present

## 2022-09-30 DIAGNOSIS — S51801D Unspecified open wound of right forearm, subsequent encounter: Secondary | ICD-10-CM | POA: Diagnosis not present

## 2022-10-13 ENCOUNTER — Ambulatory Visit (HOSPITAL_BASED_OUTPATIENT_CLINIC_OR_DEPARTMENT_OTHER)
Admission: RE | Admit: 2022-10-13 | Discharge: 2022-10-13 | Disposition: A | Discharge status: Home / Self Care | Payer: Medicare PPO | Source: Ambulatory Visit | Attending: Nurse Practitioner | Admitting: Nurse Practitioner

## 2022-10-13 DIAGNOSIS — K769 Liver disease, unspecified: Secondary | ICD-10-CM | POA: Diagnosis not present

## 2022-10-13 DIAGNOSIS — K5900 Constipation, unspecified: Secondary | ICD-10-CM | POA: Insufficient documentation

## 2022-10-13 DIAGNOSIS — D649 Anemia, unspecified: Secondary | ICD-10-CM | POA: Insufficient documentation

## 2022-10-13 DIAGNOSIS — I700 Atherosclerosis of aorta: Secondary | ICD-10-CM | POA: Diagnosis not present

## 2022-10-13 MED ORDER — GADOBUTROL 1 MMOL/ML IV SOLN
7.0000 mL | Freq: Once | INTRAVENOUS | Status: AC | PRN
  Administered 2022-10-13: 7 mL via INTRAVENOUS
  Filled 2022-10-13: qty 7.5

## 2022-10-17 ENCOUNTER — Ambulatory Visit: Payer: Medicare PPO | Admitting: Cardiology

## 2022-10-19 ENCOUNTER — Other Ambulatory Visit: Payer: Self-pay | Admitting: Family

## 2022-10-26 ENCOUNTER — Encounter: Payer: Self-pay | Admitting: Internal Medicine

## 2022-10-26 ENCOUNTER — Ambulatory Visit: Payer: Medicare PPO | Admitting: Internal Medicine

## 2022-10-26 ENCOUNTER — Other Ambulatory Visit: Payer: Self-pay

## 2022-10-26 VITALS — BP 125/70 | HR 70 | Temp 98.3°F | Ht 71.0 in | Wt 156.0 lb

## 2022-10-26 DIAGNOSIS — I38 Endocarditis, valve unspecified: Secondary | ICD-10-CM

## 2022-10-26 DIAGNOSIS — T826XXD Infection and inflammatory reaction due to cardiac valve prosthesis, subsequent encounter: Secondary | ICD-10-CM

## 2022-10-26 LAB — CBC WITH DIFFERENTIAL/PLATELET
Absolute Monocytes: 603 {cells}/uL (ref 200–950)
Basophils Absolute: 52 {cells}/uL (ref 0–200)
Basophils Relative: 0.9 %
Eosinophils Absolute: 139 {cells}/uL (ref 15–500)
Eosinophils Relative: 2.4 %
HCT: 33.1 % — ABNORMAL LOW (ref 38.5–50.0)
Hemoglobin: 10.6 g/dL — ABNORMAL LOW (ref 13.2–17.1)
Lymphs Abs: 586 {cells}/uL — ABNORMAL LOW (ref 850–3900)
MCH: 30.7 pg (ref 27.0–33.0)
MCHC: 32 g/dL (ref 32.0–36.0)
MCV: 95.9 fL (ref 80.0–100.0)
MPV: 10.2 fL (ref 7.5–12.5)
Monocytes Relative: 10.4 %
Neutro Abs: 4420 {cells}/uL (ref 1500–7800)
Neutrophils Relative %: 76.2 %
Platelets: 181 10*3/uL (ref 140–400)
RBC: 3.45 10*6/uL — ABNORMAL LOW (ref 4.20–5.80)
RDW: 17.2 % — ABNORMAL HIGH (ref 11.0–15.0)
Total Lymphocyte: 10.1 %
WBC: 5.8 10*3/uL (ref 3.8–10.8)

## 2022-10-26 MED ORDER — CEFADROXIL 500 MG PO CAPS: 500.0000 mg | ORAL_CAPSULE | Freq: Two times a day (BID) | ORAL | 5 refills | Status: DC

## 2022-10-26 NOTE — Progress Notes (Signed)
Subjective:    Patient ID: [name], male    DOB: June 03, 1940, 82 y.o.   MRN: [id number]  HPI Mr. Rackham is here for follow up of prosthetic valve endocarditis. 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  No surgery done and treated with prolonged IV antibiotics followed by oral antibiotics with amoxicillin.  He was admitted again in August with weakness and found to be leukopenic and anemic, though to be due to amoxicillin and he was changed to cefadroxil.  WBC nadir was 0.8 and last check up to 7.5   Review of Systems  Constitutional:  Negative for chills and fever.  Gastrointestinal:  Negative for diarrhea.  Skin:  Negative for rash.       Objective:   Physical Exam Eyes:     General: No scleral icterus. Pulmonary:     Effort: Pulmonary effort is normal.  Neurological:     Mental Status: He is alert.           Assessment & Plan:

## 2022-10-26 NOTE — Assessment & Plan Note (Signed)
He continues on cefadroxil and I have refilled the medication at 500 mg twice a day.  Plan for indefinite suppression.   Will check his CBC today to assure it has remained stable.  He is feeling well so no clinical concerns.  Otherwise he will return in 3 months if no concerns.    I have personally spent 32 minutes involved in face-to-face and non-face-to-face activities for this patient on the day of the visit. Professional time spent includes the following activities: Preparing to see the patient (review of tests), Obtaining and/or reviewing separately obtained history (admission/discharge record), Performing a medically appropriate examination and/or evaluation , Ordering medications/tests/procedures, referring and communicating with other health care professionals, Documenting clinical information in the EMR, Independently interpreting results (not separately reported), Communicating results to the patient/family/caregiver, Counseling and educating the patient/family/caregiver and Care coordination (not separately reported).

## 2022-10-28 ENCOUNTER — Telehealth: Payer: Self-pay

## 2022-10-28 NOTE — Telephone Encounter (Signed)
That would be up to insurance if it would be covered, is he currently getting PT at this time? I would recommend discussing this with the therapist

## 2022-10-28 NOTE — Telephone Encounter (Signed)
Patient called and states that he wants to get more sessions from Rob the PT with [facility]. Message routed to PCP Ngetich, Donalee Citrin, NP

## 2022-10-31 ENCOUNTER — Ambulatory Visit (INDEPENDENT_AMBULATORY_CARE_PROVIDER_SITE_OTHER): Payer: Medicare PPO | Admitting: Family

## 2022-10-31 ENCOUNTER — Encounter: Payer: Self-pay | Admitting: Family

## 2022-10-31 VITALS — BP 120/72 | HR 58 | Temp 97.9°F | Resp 14 | Ht 71.0 in | Wt 162.6 lb

## 2022-10-31 DIAGNOSIS — R296 Repeated falls: Secondary | ICD-10-CM | POA: Diagnosis not present

## 2022-10-31 DIAGNOSIS — R2681 Unsteadiness on feet: Secondary | ICD-10-CM | POA: Diagnosis not present

## 2022-10-31 NOTE — Telephone Encounter (Signed)
Patient was seen in office with Richarda Blade, NP today 10/31/2022. Patient issues were discussed during visit.
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
## 2022-11-02 NOTE — Telephone Encounter (Signed)
Patient was seen in office 2 days ago

## 2022-11-10 ENCOUNTER — Other Ambulatory Visit: Payer: Self-pay | Admitting: Family

## 2022-11-10 ENCOUNTER — Telehealth: Payer: Self-pay

## 2022-11-10 ENCOUNTER — Inpatient Hospital Stay: Payer: Medicare PPO | Attending: Oncology

## 2022-11-10 ENCOUNTER — Inpatient Hospital Stay: Payer: Medicare PPO | Admitting: Nurse Practitioner

## 2022-11-10 ENCOUNTER — Encounter: Payer: Self-pay | Admitting: Nurse Practitioner

## 2022-11-10 VITALS — BP 123/67 | HR 67 | Temp 97.7°F | Resp 15 | Ht 71.0 in | Wt 164.1 lb

## 2022-11-10 DIAGNOSIS — R2681 Unsteadiness on feet: Secondary | ICD-10-CM

## 2022-11-10 DIAGNOSIS — D649 Anemia, unspecified: Secondary | ICD-10-CM | POA: Diagnosis not present

## 2022-11-10 DIAGNOSIS — R296 Repeated falls: Secondary | ICD-10-CM

## 2022-11-10 DIAGNOSIS — D72819 Decreased white blood cell count, unspecified: Secondary | ICD-10-CM | POA: Diagnosis not present

## 2022-11-10 DIAGNOSIS — I10 Essential (primary) hypertension: Secondary | ICD-10-CM

## 2022-11-10 LAB — CBC WITH DIFFERENTIAL (CANCER CENTER ONLY)
Abs Immature Granulocytes: 0.01 10*3/uL (ref 0.00–0.07)
Basophils Absolute: 0.1 10*3/uL (ref 0.0–0.1)
Basophils Relative: 1 %
Eosinophils Absolute: 0.1 10*3/uL (ref 0.0–0.5)
Eosinophils Relative: 2 %
HCT: 35 % — ABNORMAL LOW (ref 39.0–52.0)
Hemoglobin: 11.5 g/dL — ABNORMAL LOW (ref 13.0–17.0)
Immature Granulocytes: 0 %
Lymphocytes Relative: 16 %
Lymphs Abs: 0.8 10*3/uL (ref 0.7–4.0)
MCH: 31 pg (ref 26.0–34.0)
MCHC: 32.9 g/dL (ref 30.0–36.0)
MCV: 94.3 fL (ref 80.0–100.0)
Monocytes Absolute: 0.6 10*3/uL (ref 0.1–1.0)
Monocytes Relative: 12 %
Neutro Abs: 3.6 10*3/uL (ref 1.7–7.7)
Neutrophils Relative %: 69 %
Platelet Count: 171 10*3/uL (ref 150–400)
RBC: 3.71 MIL/uL — ABNORMAL LOW (ref 4.22–5.81)
RDW: 17.7 % — ABNORMAL HIGH (ref 11.5–15.5)
WBC Count: 5.3 10*3/uL (ref 4.0–10.5)
nRBC: 0 % (ref 0.0–0.2)

## 2022-11-10 NOTE — Telephone Encounter (Signed)
Called patient to let him know the referral has been sent

## 2022-11-10 NOTE — Telephone Encounter (Signed)
Physical Therapy ordered.  

## 2022-11-10 NOTE — Progress Notes (Signed)
  [facility] OFFICE PROGRESS NOTE   Diagnosis: Anemia and leukopenia  INTERVAL HISTORY:   [name] returns as scheduled.  Overall feels well but feels his energy level is poor.  He has played golf several times this week.  He denies fever.  No signs of infection.  He notes easy bruising.  Objective:  Vital signs in last 24 hours:  Blood pressure 123/67, pulse 67, temperature 97.7 F (36.5 C), temperature source Oral, resp. rate 15, height 5\' 11"  (1.803 m), weight 164 lb 1.6 oz (74.4 kg), SpO2 100%.    HEENT: No bleeding in the mouth. Resp: Lungs clear bilaterally. Cardio: Regular rate and rhythm. GI: No hepatosplenomegaly. Vascular: No leg edema. Skin: Ecchymoses scattered over the forearms.   Lab Results:  Lab Results  Component Value Date   WBC 5.3 11/10/2022   HGB 11.5 (L) 11/10/2022   HCT 35.0 (L) 11/10/2022   MCV 94.3 11/10/2022   PLT 171 11/10/2022   NEUTROABS 3.6 11/10/2022    Imaging:  No results found.  Medications: I have reviewed the patient's current medications.  Assessment/Plan: 1.  Severe anemia/leukopenia with severe neutropenia Bone marrow biopsy 09/15/2022-hypercellular bone marrow for age with granulocytic hyperplasia; significant dyspoiesis or increase in blastic cells not identified; overall changes likely secondary/regenerative in nature; flow cytometry with no significant CD34 positive blastic population and no monoclonal B-cell population or significant T-cell abnormalities; cytogenetics normal. 2.  History of vitamin B12 deficiency 3.  Hyponatremia 4.  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  History of CAD 6.  CHF 7.  Gout 8.  Indeterminate posterior right liver and renal lesions on CT 07/28/2022.  MRI liver 10/13/2022-unchanged essentially nonenhancing lesion of the posterior right lobe of the liver, hepatic segment 7 with overlying capsular retraction and heterogeneous internal fluid signal measuring  approximately 3.1 x 2.0 cm, favored benign.  Benign fluid signal renal cortical cyst for which no further follow-up for characterization is required.  Kidneys otherwise normal.  Disposition: [name] appears stable.  CBC shows continued improvement in hemoglobin and white count.  We reviewed the MRI result from 10/13/2022.  Liver lesion is stable, favored to be benign, recommendation for repeat imaging at a 1 year interval.  Right renal lesions felt to be benign with no further imaging recommended.  At present he does not think he will complete further imaging of the liver lesion.  He will return for a CBC and follow-up visit in 4 months.  He will contact the office in the interim with any problems.    Lonna Cobb ANP/GNP-BC   11/10/2022  11:27 AM

## 2022-11-10 NOTE — Telephone Encounter (Signed)
Patient is calling because he needs another referral for Physical Therapy. Patient states he was told he do not qualify for the homes services because he is able to leave his home driving.

## 2022-11-10 NOTE — Telephone Encounter (Signed)
Pharmacy requested refill.  ?Pended Rx and sent to Dinah for approval.  ?

## 2022-11-10 NOTE — Addendum Note (Signed)
Addended byRicharda Blade C on: 11/10/2022 04:31 PM   Modules accepted: Orders

## 2022-11-15 ENCOUNTER — Ambulatory Visit: Payer: Medicare PPO | Admitting: Family

## 2022-11-15 ENCOUNTER — Encounter: Payer: Self-pay | Admitting: Family

## 2022-11-15 VITALS — BP 130/78 | HR 69 | Temp 97.6°F | Resp 16 | Ht 71.0 in | Wt 164.2 lb

## 2022-11-15 DIAGNOSIS — I5022 Chronic systolic (congestive) heart failure: Secondary | ICD-10-CM | POA: Diagnosis not present

## 2022-11-15 DIAGNOSIS — D509 Iron deficiency anemia, unspecified: Secondary | ICD-10-CM

## 2022-11-15 NOTE — Therapy (Signed)
OUTPATIENT PHYSICAL THERAPY LOWER EXTREMITY EVALUATION   Patient Name: Kha Orbin MRN: [id number] DOB:05-09-1940, 82 y.o., male Today's Date: 11/16/2022  END OF SESSION:  PT End of Session - 11/16/22 1149     Visit Number 1    Date for PT Re-Evaluation 01/11/23    Authorization Type [employer]- visits requested    Progress Note Due on Visit 10    PT Start Time 1051    PT Stop Time 1131    PT Time Calculation (min) 40 min    Activity Tolerance Patient tolerated treatment well    Behavior During Therapy WFL for tasks assessed/performed             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    HFrEF (heart failure with reduced ejection fraction) (HCC)    Hypertension    Moderate mitral regurgitation    Severe aortic stenosis    Past Surgical History:  Procedure Laterality Date   AORTIC VALVE REPLACEMENT N/A 07/24/2017   Procedure: AORTIC VALVE REPLACEMENT (AVR) using 23mm Inspiris Aortic Valve;  Surgeon: Alleen Borne, MD;  Location: [facility] OR;  Service: Open Heart Surgery;  Laterality: N/A;   CLIPPING OF ATRIAL APPENDAGE N/A 07/24/2017   Procedure: CLIPPING OF ATRIAL APPENDAGE using a 50mm Atricure clip;  Surgeon: Alleen Borne, MD;  Location[facility] OR;  Service: Open Heart Surgery;  Laterality: N/A;   CORONARY ARTERY BYPASS GRAFT N/A 07/24/2017   Procedure: CORONARY ARTERY BYPASS GRAFTING (CABG) times 3 using the left greater saphenous vein harvested endoscopically and left internal mammary artery.;  Surgeon: Alleen Borne, MD;  Location: [facility] OR;  Service: Open Heart Surgery;  Laterality: N/A;   EYE SURGERY Bilateral    lens replacements for cataracts   HERNIA REPAIR     INGUINAL HERNIA REPAIR Right 05/06/2015   Procedure: LAPAROSCOPIC RIGHT INGUINAL HERNIA WITH MESH;  Surgeon: Abigail Miyamoto, MD;  Location: [facility] ORS;  Service: General;   Laterality: Right;   INSERTION OF MESH Right 05/06/2015   Procedure: INSERTION OF MESH;  Surgeon: Abigail Miyamoto, MD;  Location: [facility] ORS;  Service: General;  Laterality: Right;   IR BONE MARROW BIOPSY & ASPIRATION  09/15/2022   RIGHT/LEFT HEART CATH AND CORONARY ANGIOGRAPHY N/A 07/10/2017   Procedure: RIGHT/LEFT HEART CATH AND CORONARY ANGIOGRAPHY;  Surgeon: Elder Negus, MD;  Location: [facility] INVASIVE CV LAB;  Service: Cardiovascular;  Laterality: N/A;   TEE WITHOUT CARDIOVERSION N/A 07/11/2017   Procedure: TRANSESOPHAGEAL ECHOCARDIOGRAM (TEE);  Surgeon: Elder Negus, MD;  Location: [facility] ENDOSCOPY;  Service: Cardiovascular;  Laterality: N/A;   TEE WITHOUT CARDIOVERSION N/A 07/24/2017   Procedure: TRANSESOPHAGEAL ECHOCARDIOGRAM (TEE);  Surgeon: Alleen Borne, MD;  Location: [facility] OR;  Service: Open Heart Surgery;  Laterality: N/A;   TEE WITHOUT CARDIOVERSION N/A 08/01/2022   Procedure: TRANSESOPHAGEAL ECHOCARDIOGRAM;  Surgeon: Lewayne Bunting, MD;  Location: [facility] INVASIVE CV LAB;  Service: Cardiovascular;  Laterality: N/A;   TOTAL KNEE ARTHROPLASTY Right 08/07/2019   Procedure: TOTAL KNEE ARTHROPLASTY;  Surgeon: Ollen Gross, MD;  Location: [facility] ORS;  Service: Orthopedics;  Laterality: Right;   ULTRASOUND GUIDANCE FOR VASCULAR ACCESS  07/10/2017   Procedure: Ultrasound Guidance For Vascular Access;  Surgeon: Elder Negus, MD;  Location: [facility] INVASIVE CV LAB;  Service: Cardiovascular;;  Patient Active Problem List   Diagnosis Date Noted   Leukopenia 09/13/2022   PICC (peripherally inserted central catheter) in place 08/30/2022   Pure hypercholesterolemia 08/03/2022   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   Encounter for assessment of peripherally inserted central catheter (PICC) 08/02/2022   Aortic valve endocarditis 08/01/2022   Cardiomyopathy (HCC) 08/01/2022   Chronic HFrEF (heart failure with reduced ejection fraction) (HCC) 08/01/2022   Prosthetic cardiac valve vegetation  08/01/2022   Anemia 08/01/2022   Streptococcal bacteremia 07/29/2022   Aortic atherosclerosis (HCC) 07/29/2022   Medication management 07/29/2022   Bacteremia 07/29/2022   Low back pain 06/24/2022   Constipation 06/24/2022   Renal cyst, 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   PAC (premature atrial contraction) 03/01/2021   PVC (premature ventricular contraction) 03/01/2021   Atrial bigeminy 02/28/2020   OA (osteoarthritis) of knee 08/07/2019   Primary osteoarthritis of right knee 08/07/2019   Coronary artery disease involving coronary bypass graft of native heart without angina pectoris 09/19/2018   Weakness 08/01/2018   Mixed hyperlipidemia 06/06/2018   S/P left atrial appendage ligation 07/25/2017   S/P AVR 07/25/2017   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   Coronary artery disease without angina pectoris 07/12/2017   HFrEF (heart failure with reduced ejection fraction) (HCC) 07/07/2017   Mitral regurgitation 07/07/2017   (HFpEF) heart failure with preserved ejection fraction (HCC) 07/07/2017   Gout 02/12/2013   Benign hypertension 02/12/2013   GERD (gastroesophageal reflux disease) 02/12/2013    PCP: Caesar Bookman, NP   REFERRING PROVIDER: Ngetich, Donalee Citrin, NP   REFERRING DIAG: unsteady gait, falling episodes   THERAPY DIAG:  Muscle weakness (generalized) - Plan: PT plan of care cert/re-cert  Other abnormalities of gait and mobility - Plan: PT plan of care cert/re-cert  Abnormal posture - Plan: PT plan of care cert/re-cert  Rationale for Evaluation and Treatment: Rehabilitation  ONSET DATE: 06/2022-hospitalization due to back spasms   SUBJECTIVE:   SUBJECTIVE STATEMENT: PT reports to PT with history of anemia and leukopenia with report of fall while getting off his bike 3 weeks ago. He reports that he was hospitalized due to back spasms in May and spent time time immobilized leading to getting weak.   PERTINENT HISTORY: CAD, CHF, HTN. Aortic valve replacement, TKA 2021 PAIN:  Are you having  pain? No  PRECAUTIONS: Fall  RED FLAGS: None   WEIGHT BEARING RESTRICTIONS: No  FALLS:  Has patient fallen in last 6 months? Yes. Number of falls 1 getting off bike, will address in PT  LIVING ENVIRONMENT: Lives with: lives with their family and lives in an assisted living facility [place] Lives in: House/apartment Stairs: No, has elevator.  Uses steps when able  Has following equipment at home: None  OCCUPATION: retired   PLOF: Independent and Leisure: disc golf, riding bike (not doing this now due to balance) , [employer] 2-3x/wk (weights, TRX)  PATIENT GOALS: improve balance for disc golf, return to cycling without fear of falling,   NEXT MD VISIT: next year  OBJECTIVE:  Note: Objective measures were completed at Evaluation unless otherwise noted.  DIAGNOSTIC FINDINGS: none   COGNITION: Overall cognitive status: Within functional limits for tasks assessed     SENSATION: WFL   MUSCLE LENGTH: 50% limitation in bil hamstrings   POSTURE: rounded shoulders, forward head, and flexed trunk   LOWER EXTREMITY MMT:  MMT Right eval Left eval  Hip flexion 4- 4-  Hip extension 4- 4-  Hip abduction    Hip adduction  Hip internal rotation    Hip external rotation    Knee flexion 4 4  Knee extension 4+ 4+  Ankle dorsiflexion 4+ 4+  Ankle plantarflexion    Ankle inversion    Ankle eversion     (Blank rows = not tested)  FUNCTIONAL TESTS:  11/16/22:  5 times sit to stand: 15.5 seconds  Timed up and go (TUG): 13.13 seconds   Short Physical Performance Battery:    Balance:       Side by side stance:    points (1)-1  Semi -tandem:     points (1)-1  Tandem:      points (2)-0    Gait Speed: (84m=9.84 feet) done 2x take the best time:      points                                        4.11- 3 pts     Repeated Chair Stands:      points   (timer stopped when straight on 5th stand)                                              If used arms=0 pts                                             13.87- 3 pts Total score=    8/12  points <10/12 predictive of 1 or more mobility limitations and increased risk of mobility disability 6 or less/12 associated with a higher fall rate                                                    GAIT: Distance walked: 50 Assistive device utilized: None Level of assistance: Complete Independence Comments: flexed knees and trunk, shortened step length bilaterally    TODAY'S TREATMENT:                                                                                                                              DATE: 11/16/22 HEP established- see below     PATIENT EDUCATION:  Education details: Access Code: [email or web address] Person educated: Patient Education method: Explanation, Demonstration, and Handouts Education comprehension: verbalized understanding and returned demonstration  HOME EXERCISE PROGRAM: Access Code: [email or web address] URL: [email or web address] Date: 11/16/2022 Prepared by: Tresa Endo  Exercises - Seated Hamstring Stretch  - 3 x daily - 7 x weekly - 1 sets - 3 reps - 20 hold - Theme park manager  on Wall  - 3 x daily - 7 x weekly - 1 sets - 3 reps - 20 hold - Sit to Stand Without Arm Support  - 2 x daily - 7 x weekly - 1-2 sets - 10 reps - Heel Raises with Counter Support  - 2 x daily - 7 x weekly - 2 sets - 10 reps - Standing Row with Anchored Resistance  - 2 x daily - 7 x weekly - 2 sets - 10 reps - Shoulder extension with resistance - Neutral  - 2 x daily - 7 x weekly - 2 sets - 10 reps  ASSESSMENT:  CLINICAL IMPRESSION: Patient is a 82 y.o. male who was seen today for physical therapy evaluation and treatment for gait and balance dysfunction secondary to progressing weakening.  Pt is anemic and was also hospitalized in May for back spasms.  Pt reports that he got very weak during that time and he has not yet returned to his baseline.  He was an active cyclist and has not been on his bike since 3 weeks ago  when he fell when he was getting off.  Pt also plays disc golf.  Pt with short physical performance battery score of 8/12, <10/12 predictive of 1 or more mobility limitations and increased risk of mobility disability.  Pt with shortened hamstrings and forward flexed posture in standing and walking.  TUG and 5x sit to stand are also indicative of falls risk. Patient will benefit from skilled PT to address the below impairments and improve overall function.    OBJECTIVE IMPAIRMENTS: Abnormal gait, decreased balance, decreased endurance, difficulty walking, decreased ROM, decreased strength, impaired flexibility, and postural dysfunction.   ACTIVITY LIMITATIONS: carrying, standing, squatting, stairs, transfers, and locomotion level  PARTICIPATION LIMITATIONS: cleaning, laundry, shopping, and community activity  PERSONAL FACTORS: Age, Time since onset of injury/illness/exacerbation, and 1-2 comorbidities: anemia, leukopenia  are also affecting patient's functional outcome.   REHAB POTENTIAL: Good  CLINICAL DECISION MAKING: Stable/uncomplicated  EVALUATION COMPLEXITY: Low   GOALS: Goals reviewed with patient? Yes  SHORT TERM GOALS: Target date: 12/14/2022   Be independent in initial HEP Baseline: Goal status: INITIAL  2.  Perform 5x sit to stand in < or = to 12 seconds to reduce falls risk  Baseline:  Goal status: INITIAL  3.  Improve score on SPPB to > or = to 9/12 to reduce mobility disability  Baseline: 8/12 Goal status: INITIAL  4.  Perform TUG in < or = to 12 seconds  Baseline:  Goal status: INITIAL     LONG TERM GOALS: Target date: 01/11/2023    Be independent in advanced HEP Baseline:  Goal status: INITIAL  2.  Improve score on SPPB to > or = to 10/12 to reduce mobility disability Baseline:  Goal status: INITIAL  3.  Perform dynamic balance activity with SBA or CGA to improve safety on disc golf course  Baseline:  Goal status: INITIAL  4.  Perform TUG in < or  = to 10 seconds to reduce falls risk Baseline:  Goal status: INITIAL   PLAN:  PT FREQUENCY: 2x/week  PT DURATION: 8 weeks  PLANNED INTERVENTIONS: Therapeutic exercises, Therapeutic activity, Neuromuscular re-education, Balance training, Gait training, Patient/Family education, Self Care, Joint mobilization, Joint manipulation, Stair training, Vestibular training, Canalith repositioning, Aquatic Therapy, Dry Needling, Cryotherapy, Moist heat, Taping, and Manual therapy  PLAN FOR NEXT SESSION: review HEP, gait, balance, strength, endurance progression   Lorrene Reid, PT 11/16/22 12:41 PM

## 2022-11-15 NOTE — Patient Instructions (Signed)
-   check weight at least three times per week and notify provider for any abrupt weight gain > 3 lbs   - Reduce salt intake in diet

## 2022-11-15 NOTE — Progress Notes (Signed)
Provider: Richarda Blade FNP-C  Valeree Leidy, Donalee Citrin, NP  Patient Care Team: Jakirah Zaun, Donalee Citrin, NP as PCP - General (Family Medicine) Cardiovascular, [facility]  Extended Emergency Contact Information Primary Emergency Contact: Lambertson,Johnny Address: [address]          [place], [place] [address] Darden Amber of [place] Home Phone: [phone] Mobile Phone: [phone] Relation: Son Secondary Emergency Contact: cole,sue Address: [address]          [place], [place] [address] D[place] of [place] Mobile Phone: [phone] Relation: Friend  Code Status:  Full code  Goals of care: Advanced Directive information    11/15/2022    1:57 PM  Advanced Directives  Does Patient Have a Medical Advance Directive? Yes  Type of Estate agent of [place];Living will  Does patient want to make changes to medical advance directive? Yes (Inpatient - patient defers changing a medical advance directive at this time - Information given)  Copy of Healthcare Power of Attorney in Chart? Yes - validated most recent copy scanned in chart (See row information)     Chief Complaint  Patient presents with   Follow-up    Discuss medications    HPI:  Pt is a 82 y.o. male seen today for an acute visit to discuss medication.states taking a lot of medication. Would like to know if he still needs Torsemide and Ferrous sulfate.  Currently on torsemide due to chronic systolic Heart Failure.He denies any cough,wheezing,shortness of breath or edema.states weight has been stable at home but has gain 4 lbs since last seen 5 days ago. Has upcoming appointment with Cardiology.  Wonders whether still need ferrous sulfate.His recent Hgb was 11.5 previous 10.6 states no blood in the stool or dark colored stool.  States his balance still unsteady.will be starting Physical therapy tomorrow.No recent fall episode see last seen.    Past Medical History:  Diagnosis Date   Anemia    CAD  (coronary artery disease)    a. 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    HFrEF (heart failure with reduced ejection fraction) (HCC)    Hypertension    Moderate mitral regurgitation    Severe aortic stenosis    Past Surgical History:  Procedure Laterality Date   AORTIC VALVE REPLACEMENT N/A 07/24/2017   Procedure: AORTIC VALVE REPLACEMENT (AVR) using 23mm Inspiris Aortic Valve;  Surgeon: Alleen Borne, MD;  Location: [facility] OR;  Service: Open Heart Surgery;  Laterality: N/A;   CLIPPING OF ATRIAL APPENDAGE N/A 07/24/2017   Procedure: CLIPPING OF ATRIAL APPENDAGE using a 50mm Atricure clip;  Surgeon: Alleen Borne, MD;  Location[facility] OR;  Service: Open Heart Surgery;  Laterality: N/A;   CORONARY ARTERY BYPASS GRAFT N/A 07/24/2017   Procedure: CORONARY ARTERY BYPASS GRAFTING (CABG) times 3 using the left greater saphenous vein harvested endoscopically and left internal mammary artery.;  Surgeon: Alleen Borne, MD;  Location: [facility] OR;  Service: Open Heart Surgery;  Laterality: N/A;   EYE SURGERY Bilateral    lens replacements for cataracts   HERNIA REPAIR     INGUINAL HERNIA REPAIR Right 05/06/2015   Procedure: LAPAROSCOPIC RIGHT INGUINAL HERNIA WITH MESH;  Surgeon: Abigail Miyamoto, MD;  Location: [facility] ORS;  Service: General;  Laterality: Right;   INSERTION OF MESH Right 05/06/2015   Procedure: INSERTION OF MESH;  Surgeon: Abigail Miyamoto, MD;  Location: [facility] ORS;  Service: General;  Laterality: Right;   IR BONE MARROW BIOPSY & ASPIRATION  09/15/2022   RIGHT/LEFT HEART CATH AND CORONARY ANGIOGRAPHY N/A 07/10/2017   Procedure: RIGHT/LEFT HEART CATH AND CORONARY ANGIOGRAPHY;  Surgeon: Elder Negus, MD;  Location: [facility] INVASIVE CV LAB;  Service: Cardiovascular;  Laterality: N/A;   TEE WITHOUT CARDIOVERSION N/A 07/11/2017   Procedure: TRANSESOPHAGEAL ECHOCARDIOGRAM (TEE);  Surgeon: Elder Negus, MD;  Location: [facility] ENDOSCOPY;  Service:  Cardiovascular;  Laterality: N/A;   TEE WITHOUT CARDIOVERSION N/A 07/24/2017   Procedure: TRANSESOPHAGEAL ECHOCARDIOGRAM (TEE);  Surgeon: Alleen Borne, MD;  Location: [facility] OR;  Service: Open Heart Surgery;  Laterality: N/A;   TEE WITHOUT CARDIOVERSION N/A 08/01/2022   Procedure: TRANSESOPHAGEAL ECHOCARDIOGRAM;  Surgeon: Lewayne Bunting, MD;  Location: [facility] INVASIVE CV LAB;  Service: Cardiovascular;  Laterality: N/A;   TOTAL KNEE ARTHROPLASTY Right 08/07/2019   Procedure: TOTAL KNEE ARTHROPLASTY;  Surgeon: Ollen Gross, MD;  Location: [facility] ORS;  Service: Orthopedics;  Laterality: Right;   ULTRASOUND GUIDANCE FOR VASCULAR ACCESS  07/10/2017   Procedure: Ultrasound Guidance For Vascular Access;  Surgeon: Elder Negus, MD;  Location: [facility] INVASIVE CV LAB;  Service: Cardiovascular;;    Allergies  Allergen Reactions   Sulfamethoxazole-Trimethoprim Rash    Outpatient Encounter Medications as of 11/15/2022  Medication Sig   acetaminophen (TYLENOL) 500 MG tablet Take 2 tablets (1,000 mg total) by mouth every 6 (six) hours as needed for mild pain or fever.   ascorbic acid (VITAMIN C) 500 MG tablet TAKE 1 TABLET (500 MG TOTAL) BY MOUTH DAILY.   aspirin EC 81 MG tablet Take 81 mg by mouth daily. Swallow whole.   atorvastatin (LIPITOR) 40 MG tablet TAKE 1 TABLET BY MOUTH EVERY DAY   cefadroxil (DURICEF) 500 MG capsule Take 1 capsule (500 mg total) by mouth 2 (two) times daily.   cetirizine (ZYRTEC) 10 MG tablet Take 10 mg by mouth daily.   ferrous gluconate (FERGON) 324 MG tablet TAKE 1 TABLET BY MOUTH EVERY DAY WITH BREAKFAST   metoprolol succinate (TOPROL-XL) 25 MG 24 hr tablet Take 0.5 tablets (12.5 mg total) by mouth daily. Take with or immediately following a meal. (Patient taking differently: Take 25 mg by mouth daily. Take with or immediately following a meal.)   Multiple Vitamin (MULTIVITAMIN ADULT PO) Take 1 tablet by mouth daily.   MYRBETRIQ 25 MG TB24 tablet Take 25 mg by mouth daily.    Omega-3 Fatty Acids (FISH OIL) 1000 MG CAPS Take 2 capsules by mouth daily.   tamsulosin (FLOMAX) 0.4 MG CAPS capsule TAKE 1 CAPSULE BY MOUTH EVERY DAY   torsemide (DEMADEX) 10 MG tablet TAKE 1 TABLET BY MOUTH TWICE A DAY   No facility-administered encounter medications on file as of 11/15/2022.    Review of Systems  Constitutional:  Negative for appetite change, chills, fatigue, fever and unexpected weight change.  HENT:  Negative for congestion, ear discharge, ear pain, hearing loss, nosebleeds, postnasal drip, rhinorrhea, sinus pressure, sinus pain, sneezing, sore throat and tinnitus.   Eyes:  Negative for pain, discharge, redness, itching and visual disturbance.  Respiratory:  Negative for cough, chest tightness, shortness of breath and wheezing.   Cardiovascular:  Negative for chest pain, palpitations and leg swelling.  Gastrointestinal:  Negative for abdominal distention, abdominal pain, blood in stool, constipation, diarrhea, nausea and vomiting.  Genitourinary:  Negative for difficulty urinating, dysuria, flank pain, frequency and urgency.  Musculoskeletal:  Positive for gait problem. Negative for arthralgias, back pain, joint swelling, myalgias, neck  pain and neck stiffness.  Skin:  Negative for color change, pallor and rash.  Neurological:  Negative for dizziness, weakness, light-headedness, numbness and headaches.    Immunization History  Administered Date(s) Administered   Fluad Quad(high Dose 65+) 01/18/2022   Influenza, High Dose Seasonal PF 11/24/2018, 10/19/2022   Influenza-Unspecified 11/24/2018   Moderna Covid-19 Fall Seasonal Vaccine 66yrs & older 10/19/2022   Moderna Sars-Covid-2 Vaccination 03/15/2019, 03/15/2019, 04/12/2019, 04/12/2019   PNEUMOCOCCAL CONJUGATE-20 01/18/2022   Tdap 02/11/2013, 01/18/2019   Zoster Recombinant(Shingrix) 02/26/2019, 05/20/2019   Pertinent  Health Maintenance Due  Topic Date Due   INFLUENZA VACCINE  Completed      09/21/2022     9:55 AM 09/22/2022    9:21 AM 10/26/2022   10:06 AM 10/31/2022   10:03 AM 11/15/2022    1:58 PM  Fall Risk  Falls in the past year? 0 0 0 1 0  Was there an injury with Fall? 0 0  1   Fall Risk Category Calculator 0 0  2   Patient at Risk for Falls Due to No Fall Risks  No Fall Risks    Fall risk Follow up Falls evaluation completed  Falls evaluation completed     Functional Status Survey:    Vitals:   11/15/22 1401  BP: 130/78  Pulse: 69  Resp: 16  Temp: 97.6 F (36.4 C)  SpO2: 98%  Weight: 164 lb 3.2 oz (74.5 kg)  Height: 5\' 11"  (1.803 m)   Body mass index is 22.9 kg/m. Physical Exam Vitals reviewed.  Constitutional:      General: He is not in acute distress.    Appearance: Normal appearance. He is normal weight. He is not ill-appearing or diaphoretic.  HENT:     Head: Normocephalic.     Mouth/Throat:     Mouth: Mucous membranes are moist.     Pharynx: Oropharynx is clear. No oropharyngeal exudate or posterior oropharyngeal erythema.  Eyes:     General: No scleral icterus.       Right eye: No discharge.        Left eye: No discharge.     Conjunctiva/sclera: Conjunctivae normal.     Pupils: Pupils are equal, round, and reactive to light.  Neck:     Vascular: No carotid bruit.  Cardiovascular:     Rate and Rhythm: Normal rate and regular rhythm.     Pulses: Normal pulses.     Heart sounds: Normal heart sounds. No murmur heard.    No friction rub. No gallop.  Pulmonary:     Effort: Pulmonary effort is normal. No respiratory distress.     Breath sounds: Normal breath sounds. No wheezing, rhonchi or rales.  Chest:     Chest wall: No tenderness.  Abdominal:     General: Bowel sounds are normal. There is no distension.     Palpations: Abdomen is soft. There is no mass.     Tenderness: There is no abdominal tenderness. There is no right CVA tenderness, left CVA tenderness, guarding or rebound.  Musculoskeletal:        General: No swelling or tenderness. Normal range  of motion.     Cervical back: Normal range of motion. No rigidity or tenderness.     Right lower leg: No edema.     Left lower leg: No edema.  Lymphadenopathy:     Cervical: No cervical adenopathy.  Skin:    General: Skin is warm and dry.     Coloration: Skin is  not pale.     Findings: No bruising, erythema, lesion or rash.  Neurological:     Mental Status: He is alert and oriented to person, place, and time.     Cranial Nerves: No cranial nerve deficit.     Sensory: No sensory deficit.     Motor: No weakness.     Coordination: Coordination normal.     Gait: Gait abnormal.  Psychiatric:        Mood and Affect: Mood normal.        Speech: Speech normal.        Behavior: Behavior normal.     Labs reviewed: Recent Labs    07/19/22 1656 07/28/22 0209 07/30/22 0915 08/02/22 0931 09/13/22 1153 09/14/22 0515 09/16/22 0629 09/17/22 0631 09/21/22 1019  NA 133*   < > 131*   < > 129*   < > 132* 132* 134*  K 5.1   < > 3.8   < > 3.4*   < > 3.9 3.7 4.6  CL 97*   < > 94*   < > 94*   < > 94* 96* 97*  CO2 28   < > 28   < > 25   < > 29 25 28   GLUCOSE 114   < > 113*   < > 112*   < > 109* 97 93  BUN 24   < > 27*   < > 14   < > 10 11 11   CREATININE 1.04   < > 1.01   < > 0.82   < > 0.85 0.82 0.99  CALCIUM 8.7   < > 8.7*   < > 8.4*   < > 8.9 8.7* 9.8  MG 2.0  --  1.9  --  1.9  --   --   --   --   PHOS  --   --  2.9  --   --   --   --   --   --    < > = values in this interval not displayed.   Recent Labs    09/15/22 0654 09/16/22 0629 09/17/22 0631 09/21/22 1019  AST 73* 43* 22 14  ALT 74* 62* 47* 24  ALKPHOS 42 46 48  --   BILITOT 0.7 0.4 0.6 0.3  PROT 5.4* 5.6* 5.7* 7.2  ALBUMIN 2.5* 2.5* 2.6*  --    Recent Labs    09/28/22 1312 10/26/22 1021 11/10/22 1040  WBC 7.5 5.8 5.3  NEUTROABS 6.3 4,420 3.6  HGB 9.5* 10.6* 11.5*  HCT 29.1* 33.1* 35.0*  MCV 92.7 95.9 94.3  PLT 225 181 171   Lab Results  Component Value Date   TSH 1.124 09/13/2022   Lab Results  Component  Value Date   HGBA1C 5.7 (H) 07/20/2017   Lab Results  Component Value Date   CHOL 212 (H) 08/03/2022   HDL 33 (L) 08/03/2022   LDLCALC 161 (H) 08/03/2022   LDLDIRECT 155 (H) 08/03/2022   TRIG 90 08/03/2022   CHOLHDL 6.4 08/03/2022    Significant Diagnostic Results in last 30 days:  No results found.  Assessment/Plan 1. Chronic systolic heart failure (HCC) - No signs of fluid overload  - Advised to check weight at home and notify provider for any abrupt weight gain > 3 lbs  - Reduce salt intake in diet  -Continue on torsemide and follow-up with cardiologist as scheduled.   2. Iron deficiency anemia, unspecified iron deficiency anemia type Latest hemoglobin 11.5  previous 10.6 Asymptomatic -Continue with ferrous sulfate -Encouraged to increase foods high in iron in his diet  Family/ staff Communication: Reviewed plan of care with patient verbalized understanding  Labs/tests ordered: None   Next Appointment: Return if symptoms worsen or fail to improve.   Caesar Bookman, NP

## 2022-11-16 ENCOUNTER — Other Ambulatory Visit: Payer: Self-pay

## 2022-11-16 ENCOUNTER — Ambulatory Visit: Payer: Medicare PPO | Attending: Family

## 2022-11-16 DIAGNOSIS — I10 Essential (primary) hypertension: Secondary | ICD-10-CM | POA: Diagnosis not present

## 2022-11-16 DIAGNOSIS — R296 Repeated falls: Secondary | ICD-10-CM | POA: Insufficient documentation

## 2022-11-16 DIAGNOSIS — R293 Abnormal posture: Secondary | ICD-10-CM | POA: Insufficient documentation

## 2022-11-16 DIAGNOSIS — R2681 Unsteadiness on feet: Secondary | ICD-10-CM | POA: Diagnosis not present

## 2022-11-16 DIAGNOSIS — R2689 Other abnormalities of gait and mobility: Secondary | ICD-10-CM

## 2022-11-16 DIAGNOSIS — M6281 Muscle weakness (generalized): Secondary | ICD-10-CM

## 2022-11-17 DIAGNOSIS — R350 Frequency of micturition: Secondary | ICD-10-CM | POA: Diagnosis not present

## 2022-11-17 DIAGNOSIS — N401 Enlarged prostate with lower urinary tract symptoms: Secondary | ICD-10-CM | POA: Diagnosis not present

## 2022-11-17 DIAGNOSIS — R351 Nocturia: Secondary | ICD-10-CM | POA: Diagnosis not present

## 2022-11-23 ENCOUNTER — Encounter: Payer: Self-pay | Admitting: Gastroenterology

## 2022-11-23 ENCOUNTER — Other Ambulatory Visit: Payer: Medicare PPO

## 2022-11-23 ENCOUNTER — Ambulatory Visit: Payer: Medicare PPO | Admitting: Gastroenterology

## 2022-11-23 VITALS — BP 122/70 | HR 70 | Ht 71.0 in | Wt 167.6 lb

## 2022-11-23 DIAGNOSIS — D6489 Other specified anemias: Secondary | ICD-10-CM

## 2022-11-23 DIAGNOSIS — R932 Abnormal findings on diagnostic imaging of liver and biliary tract: Secondary | ICD-10-CM | POA: Diagnosis not present

## 2022-11-23 NOTE — Patient Instructions (Signed)
Your provider has requested that you go to the basement level for lab work before leaving today. Press "B" on the elevator. The lab is located at the first door on the left as you exit the elevator.  _______________________________________________________  If your blood pressure at your visit was 140/90 or greater, please contact your primary care physician to follow up on this.  _______________________________________________________  If you are age 81 or older, your body mass index should be between 23-30. Your Body mass index is 23.38 kg/m. If this is out of the aforementioned range listed, please consider follow up with your Primary Care Provider.  If you are age 52 or younger, your body mass index should be between 19-25. Your Body mass index is 23.38 kg/m. If this is out of the aformentioned range listed, please consider follow up with your Primary Care Provider.   ________________________________________________________  The [facility] providers would like to encourage you to use [facility] to communicate with providers for non-urgent requests or questions.  Due to long hold times on the telephone, sending your provider a message by [facility] may be a faster and more efficient way to get a response.  Please allow 48 business hours for a response.  Please remember that this is for non-urgent requests.   It was a pleasure to see you today!  Thank you for trusting me with your gastrointestinal care!     Scott E.Tomasa Rand, MD

## 2022-11-23 NOTE — Progress Notes (Unsigned)
HPI : [name] is a 82 y.o. male with a history of CAD s/p CABG, severe AS s/p AVR, LAA clipping, diastolic congestive heart failure and recent history of bacteremia and prosthetic valve endocarditis who is referred to [place] by Medina-Vargas, Monina C* for further evaluation of incidental liver lesion.  The patient tells me he is not sure why he is here today.  He states that he feels well and has recovered from his infection.  He is active and frequently plays disc golf.  He is having some issues with gait instability and balance and is undergoing physical therapy to help him with this.  He was first noted to have a hepatic lesion in May of this year when he was admitted for severe hyponatremia and severe back pain.  At that time, the lesion was 4.5 cm and triangular-shaped, possibly thought to represent hepatic laceration without bleeding.  There is no previous hepatic imaging for comparison.  A repeat CT scan was performed in June when he was admitted with endocarditis.  This showed an unchanged 4.5 cm triangular-shaped hypodensity  An MRI was obtained in September to follow-up on this lesion.  The MRI showed an unchanged nonenhancing lesion of the right posterior lobe with capsular retraction and heterogenous internal fluid signal, measuring 3.1 x 2 cm.  The radiologist interpretation was that this was very likely to be benign, possibly an involuted sequela of a hepatic cyst or hemangioma or a sequela of prior trauma.  The patient denies any known history of abdominal trauma such as motor vehicle accident.  He has no known liver disease.  No family history of liver disease or liver cancer.  He is a nondrinker. He denies any symptoms that may be related to the hepatic lesion such as abdominal pain.  He denies any GI symptoms such as abdominal pain, constipation, diarrhea or blood in the stool.  His appetite is good and his weight is stable.  He has been followed by hematology for severe  anemia, leukopenia/neutropenia in August and underwent a liver biopsy in August of this year.  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  His blood counts have improved spontaneously.  He did have low B12 few months ago, but this has since normalized.  Reports undergoing a colonoscopy in [place] over 10 years ago which he says was normal.  He states he was advised to discontinue colon cancer screening.   MRI Liver Sept 5, 2024 IMPRESSION: 1. Unchanged essentially nonenhancing lesion of the posterior right lobe of the liver, hepatic segment VII with overlying capsular retraction and heterogeneous internal fluid signal, although ill-defined measuring approximately 3.1 x 2.0 cm. In general would strongly favor benign, involuted sequelae of a hepatic cyst or hemangioma, or perhaps alternately sequelae of prior trauma. Consider follow-up in 1 year to ensure long-term stability. 2. Large burden of stool in the colon.   CT, chest/abdomen/pelvis July 28, 2022 IMPRESSION: 1. Hazy diffuse ground-glass opacities in the lungs bilaterally, greater on the right than on the left, possible edema versus multifocal pneumonia. 2. Small bilateral pleural effusions with compressive atelectasis. 3. Stable indeterminate hypodensity in the posterior right lobe of the liver measuring 4.5 cm. MRI with contrast is recommended for further evaluation on nonemergent follow-up given history of hepatitis. 4. Small ascites in the pelvis. 5. Complex cyst in the mid right kidney measuring 2 cm. Lesion may also be better evaluated on MRI. 6. Small hiatal hernia. 7. Aortic atherosclerosis and coronary artery calcifications.  CT abdomen/pelvis  Jun 24, 2022 IMPRESSION: 1. Moderate hiatal hernia. 2. Increased stool burden within the ascending colon and transverse colon. Correlate for constipation. 3. Indeterminate right posterior hepatic lobe 4.5 cm triangular hypodensity.  Correlate with point tenderness to palpation and recent trauma-could possibly represent a hepatic laceration with no active bleed. Comparison with prior cross-sectional imaging would be of value.  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   CHF (congestive heart failure) (HCC)    Elevated cholesterol    Gout    Hepatitis 1964   hepatitis A    HFrEF (heart failure with reduced ejection fraction) (HCC)    Hypertension    Moderate mitral regurgitation    Severe aortic stenosis      Past Surgical History:  Procedure Laterality Date   AORTIC VALVE REPLACEMENT N/A 07/24/2017   Procedure: AORTIC VALVE REPLACEMENT (AVR) using 23mm Inspiris Aortic Valve;  Surgeon: Alleen Borne, MD;  Location: [facility] OR;  Service: Open Heart Surgery;  Laterality: N/A;   CLIPPING OF ATRIAL APPENDAGE N/A 07/24/2017   Procedure: CLIPPING OF ATRIAL APPENDAGE using a 50mm Atricure clip;  Surgeon: Alleen Borne, MD;  Location: [facility] OR;  Service: Open Heart Surgery;  Laterality: N/A;   CORONARY ARTERY BYPASS GRAFT N/A 07/24/2017   Procedure: CORONARY ARTERY BYPASS GRAFTING (CABG) times 3 using the left greater saphenous vein harvested endoscopically and left internal mammary artery.;  Surgeon: Alleen Borne, MD;  Location: [facility] OR;  Service: Open Heart Surgery;  Laterality: N/A;   EYE SURGERY Bilateral    lens replacements for cataracts   HERNIA REPAIR     INGUINAL HERNIA REPAIR Right 05/06/2015   Procedure: LAPAROSCOPIC RIGHT INGUINAL HERNIA WITH MESH;  Surgeon: Abigail Miyamoto, MD;  Location: [facility] ORS;  Service: General;  Laterality: Right;   INSERTION OF MESH Right 05/06/2015   Procedure: INSERTION OF MESH;  Surgeon: Abigail Miyamoto, MD;  Location: [facility] ORS;  Service: General;  Laterality: Right;   IR BONE MARROW BIOPSY & ASPIRATION  09/15/2022   RIGHT/LEFT HEART CATH AND CORONARY ANGIOGRAPHY N/A 07/10/2017   Procedure: RIGHT/LEFT HEART CATH AND CORONARY  ANGIOGRAPHY;  Surgeon: Elder Negus, MD;  Location: [facility] INVASIVE [facility] LAB;  Service: Cardiovascular;  Laterality: N/A;   TEE WITHOUT CARDIOVERSION N/A 07/11/2017   Procedure: TRANSESOPHAGEAL ECHOCARDIOGRAM (TEE);  Surgeon: Elder Negus, MD;  Location: [facility];  Service: Cardiovascular;  Laterality: N/A;   TEE WITHOUT CARDIOVERSION N/A 07/24/2017   Procedure: TRANSESOPHAGEAL ECHOCARDIOGRAM (TEE);  Surgeon: Alleen Borne, MD;  Location: [facility];  Service: Open Heart Surgery;  Laterality: N/A;   TEE WITHOUT CARDIOVERSION N/A 08/01/2022   Procedure: TRANSESOPHAGEAL ECHOCARDIOGRAM;  Surgeon: Lewayne Bunting, MD;  Location: [facility] INVASIVE CV LAB;  Service: Cardiovascular;  Laterality: N/A;   TOTAL KNEE ARTHROPLASTY Right 08/07/2019   Procedure: TOTAL KNEE ARTHROPLASTY;  Surgeon: Ollen Gross, MD;  Location: [facility] ORS;  Service: Orthopedics;  Laterality: Right;   ULTRASOUND GUIDANCE FOR VASCULAR ACCESS  07/10/2017   Procedure: Ultrasound Guidance For Vascular Access;  Surgeon: Elder Negus, MD;  Location: [facility] INVASIVE CV LAB;  Service: Cardiovascular;;   Family History  Problem Relation Age of Onset   Stroke Mother    Heart attack Father    Social History   Tobacco Use   Smoking status: Former    Current packs/day: 0.00    Average packs/day: 1 pack/day for 12.0 years (  12.0 ttl pk-yrs)    Types: Cigarettes    Start date: 46    Quit date: 73    Years since quitting: 51.8   Smokeless tobacco: Never   Tobacco comments:    quit 30-40 yrs ago  Vaping Use   Vaping status: Never Used  Substance Use Topics   Alcohol use: Yes    Comment: 1-2 drinks   Drug use: Yes    Types: Marijuana   Current Outpatient Medications  Medication Sig Dispense Refill   acetaminophen (TYLENOL) 500 MG tablet Take 2 tablets (1,000 mg total) by mouth every 6 (six) hours as needed for mild pain or fever. 30 tablet 0   ascorbic acid (VITAMIN C) 500 MG tablet TAKE 1 TABLET (500 MG TOTAL) BY MOUTH  DAILY. 90 tablet 3   aspirin EC 81 MG tablet Take 81 mg by mouth daily. Swallow whole.     atorvastatin (LIPITOR) 40 MG tablet TAKE 1 TABLET BY MOUTH EVERY DAY 90 tablet 1   cefadroxil (DURICEF) 500 MG capsule Take 1 capsule (500 mg total) by mouth 2 (two) times daily. 60 capsule 5   cetirizine (ZYRTEC) 10 MG tablet Take 10 mg by mouth daily.     ferrous gluconate (FERGON) 324 MG tablet TAKE 1 TABLET BY MOUTH EVERY DAY WITH BREAKFAST 90 tablet 1   metoprolol succinate (TOPROL-XL) 25 MG 24 hr tablet Take 0.5 tablets (12.5 mg total) by mouth daily. Take with or immediately following a meal. (Patient taking differently: Take 25 mg by mouth daily. Take with or immediately following a meal.) 30 tablet 3   Multiple Vitamin (MULTIVITAMIN ADULT PO) Take 1 tablet by mouth daily.     MYRBETRIQ 25 MG TB24 tablet Take 25 mg by mouth daily.     Omega-3 Fatty Acids (FISH OIL) 1000 MG CAPS Take 2 capsules by mouth daily.     tamsulosin (FLOMAX) 0.4 MG CAPS capsule TAKE 1 CAPSULE BY MOUTH EVERY DAY 90 capsule 1   torsemide (DEMADEX) 10 MG tablet TAKE 1 TABLET BY MOUTH TWICE A DAY 180 tablet 1   No current facility-administered medications for this visit.   Allergies  Allergen Reactions   Sulfamethoxazole-Trimethoprim Rash     Review of Systems: All systems reviewed and negative except where noted in HPI.    No results found.  Physical Exam: BP 122/70   Pulse 70   Ht 5\' 11"  (1.803 m)   Wt 167 lb 9.6 oz (76 kg)   SpO2 99%   BMI 23.38 kg/m  Constitutional: Pleasant,well-developed, Caucasian male in no acute distress. HEENT: Normocephalic and atraumatic. Conjunctivae are normal. No scleral icterus. Neck supple.  Cardiovascular: Normal rate, regular rhythm with some ectopy noted.  No murmurs. Pulmonary/chest: Effort normal and breath sounds normal. No wheezing, rales or rhonchi. Abdominal: Soft, nondistended, nontender. Bowel sounds active throughout. There are no masses palpable. No  hepatomegaly. Extremities: no edema Neurological: Alert and oriented to person place and time. Skin: Skin is warm and dry. No rashes noted. Psychiatric: Normal mood and affect. Behavior is normal.  CBC    Component Value Date/Time   WBC 5.3 11/10/2022 1040   WBC 5.8 10/26/2022 1021   RBC 3.71 (L) 11/10/2022 1040   HGB 11.5 (L) 11/10/2022 1040   HCT 35.0 (L) 11/10/2022 1040   PLT 171 11/10/2022 1040   MCV 94.3 11/10/2022 1040   MCH 31.0 11/10/2022 1040   MCHC 32.9 11/10/2022 1040   RDW 17.7 (H) 11/10/2022 1040   LYMPHSABS  0.8 11/10/2022 1040   MONOABS 0.6 11/10/2022 1040   EOSABS 0.1 11/10/2022 1040   BASOSABS 0.1 11/10/2022 1040    CMP     Component Value Date/Time   NA 134 (L) 09/21/2022 1019   NA 137 08/22/2022 1006   K 4.6 09/21/2022 1019   CL 97 (L) 09/21/2022 1019   CO2 28 09/21/2022 1019   GLUCOSE 93 09/21/2022 1019   BUN 11 09/21/2022 1019   BUN 16 08/22/2022 1006   CREATININE 0.99 09/21/2022 1019   CALCIUM 9.8 09/21/2022 1019   PROT 7.2 09/21/2022 1019   ALBUMIN 2.6 (L) 09/17/2022 0631   AST 14 09/21/2022 1019   ALT 24 09/21/2022 1019   ALKPHOS 48 09/17/2022 0631   BILITOT 0.3 09/21/2022 1019   GFRNONAA >60 09/17/2022 0631   GFRAA >60 08/08/2019 0250       Latest Ref Rng & Units 11/10/2022   10:40 AM 10/26/2022   10:21 AM 09/28/2022    1:12 PM  CBC EXTENDED  WBC 4.0 - 10.5 K/uL 5.3  5.8  7.5   RBC 4.22 - 5.81 MIL/uL 3.71  3.45  3.14   Hemoglobin 13.0 - 17.0 g/dL 44.0  34.7  9.5   HCT 42.5 - 52.0 % 35.0  33.1  29.1   Platelets 150 - 400 K/uL 171  181  225   NEUT# 1.7 - 7.7 K/uL 3.6  4,420  6.3   Lymph# 0.7 - 4.0 K/uL 0.8  586  0.7       ASSESSMENT AND PLAN: 82 year old male with extensive cardiac history as outlined above, incidentally found to have an atypical hepatic lesion on CT in May of this year.  No previous imaging for comparison.  This lesion has remained stable on subsequent images in June (CT) and September (MRI).  The MRI findings were  most suggestive of a benign lesion such as an involuted cyst or hemangioma.  He has no history of liver disease or family history of liver disease or liver cancer.  The likelihood of a malignant lesion is extremely low given its appearance, stability and his lack of risk factors. Agree with plans to repeat MRI in 1 year to document stability.  Will obtain AFP today.  Otherwise, no further evaluation is recommended.  With regards to the patient's anemia, his hemoglobin has improved significantly over the past few months.  He had a negative fecal occult blood test and denies any overt GI bleeding.  I would recommend against any endoscopic evaluation.  Indeterminate liver lesion, very likely benign - Repeat MRI in 1 year - Baseline AFP - No further evaluation recommended  Anemia, pancytopenia - Improving, suspect reactive (infection?  Antibiotics?)-negative FOBT - Recommend against endoscopic evaluation   Medina-Vargas, Monina C*

## 2022-11-24 NOTE — Therapy (Signed)
OUTPATIENT PHYSICAL THERAPY LOWER EXTREMITY TREATMENT   Patient Name: [name] MRN: [id number] DOB:02-Sep-1940, 82 y.o., male Today's Date: 11/25/2022  END OF SESSION:  PT End of Session - 11/25/22 1006     Visit Number 2    Date for PT Re-Evaluation 01/11/23    Authorization Type [employer]- visits requested    PT Start Time 1006    PT Stop Time 1052    PT Time Calculation (min) 46 min    Activity Tolerance Patient tolerated treatment well    Behavior During Therapy WFL for tasks assessed/performed              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   CHF (congestive heart failure) (HCC)    Elevated cholesterol    Gout    Hepatitis 1964   hepatitis A    HFrEF (heart failure with reduced ejection fraction) (HCC)    Hypertension    Moderate mitral regurgitation    Severe aortic stenosis    Past Surgical History:  Procedure Laterality Date   AORTIC VALVE REPLACEMENT N/A 07/24/2017   Procedure: AORTIC VALVE REPLACEMENT (AVR) using 23mm Inspiris Aortic Valve;  Surgeon: Jesse Borne, MD;  Location: [facility] OR;  Service: Open Heart Surgery;  Laterality: N/A;   CLIPPING OF ATRIAL APPENDAGE N/A 07/24/2017   Procedure: CLIPPING OF ATRIAL APPENDAGE using a 50mm Atricure clip;  Surgeon: Jesse Borne, MD;  Location: [facility] OR;  Service: Open Heart Surgery;  Laterality: N/A;   CORONARY ARTERY BYPASS GRAFT N/A 07/24/2017   Procedure: CORONARY ARTERY BYPASS GRAFTING (CABG) times 3 using the left greater saphenous vein harvested endoscopically and left internal mammary artery.;  Surgeon: Jesse Borne, MD;  Location: [facility] OR;  Service: Open Heart Surgery;  Laterality: N/A;   EYE SURGERY Bilateral    lens replacements for cataracts   HERNIA REPAIR     INGUINAL HERNIA REPAIR Right 05/06/2015   Procedure: LAPAROSCOPIC RIGHT INGUINAL HERNIA WITH MESH;  Surgeon: Jesse Miyamoto, MD;  Location: [facility] ORS;  Service: General;   Laterality: Right;   INSERTION OF MESH Right 05/06/2015   Procedure: INSERTION OF MESH;  Surgeon: Jesse Miyamoto, MD;  Location: [facility] ORS;  Service: General;  Laterality: Right;   IR BONE MARROW BIOPSY & ASPIRATION  09/15/2022   RIGHT/LEFT HEART CATH AND CORONARY ANGIOGRAPHY N/A 07/10/2017   Procedure: RIGHT/LEFT HEART CATH AND CORONARY ANGIOGRAPHY;  Surgeon: Jesse Negus, MD;  Location: [facility] INVASIVE CV LAB;  Service: Cardiovascular;  Laterality: N/A;   TEE WITHOUT CARDIOVERSION N/A 07/11/2017   Procedure: TRANSESOPHAGEAL ECHOCARDIOGRAM (TEE);  Surgeon: Jesse Negus, MD;  Location: [facility] ENDOSCOPY;  Service: Cardiovascular;  Laterality: N/A;   TEE WITHOUT CARDIOVERSION N/A 07/24/2017   Procedure: TRANSESOPHAGEAL ECHOCARDIOGRAM (TEE);  Surgeon: Jesse Borne, MD;  Location: [facility] OR;  Service: Open Heart Surgery;  Laterality: N/A;   TEE WITHOUT CARDIOVERSION N/A 08/01/2022   Procedure: TRANSESOPHAGEAL ECHOCARDIOGRAM;  Surgeon: Jesse Bunting, MD;  Location: [facility] INVASIVE CV LAB;  Service: Cardiovascular;  Laterality: N/A;   TOTAL KNEE ARTHROPLASTY Right 08/07/2019   Procedure: TOTAL KNEE ARTHROPLASTY;  Surgeon: Jesse Gross, MD;  Location: [facility] ORS;  Service: Orthopedics;  Laterality: Right;   ULTRASOUND GUIDANCE FOR VASCULAR ACCESS  07/10/2017   Procedure: Ultrasound Guidance For Vascular Access;  Surgeon: Jesse Negus, MD;  Location: [facility] INVASIVE CV LAB;  Service: Cardiovascular;;   Patient Active Problem  List   Diagnosis Date Noted   Leukopenia 09/13/2022   PICC (peripherally inserted central catheter) in place 08/30/2022   Pure hypercholesterolemia 08/03/2022   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   Encounter for assessment of peripherally inserted central catheter (PICC) 08/02/2022   Aortic valve endocarditis 08/01/2022   Cardiomyopathy (HCC) 08/01/2022   Chronic HFrEF (heart failure with reduced ejection fraction) (HCC) 08/01/2022   Prosthetic cardiac valve vegetation  08/01/2022   Anemia 08/01/2022   Streptococcal bacteremia 07/29/2022   Aortic atherosclerosis (HCC) 07/29/2022   Medication management 07/29/2022   Bacteremia 07/29/2022   Low back pain 06/24/2022   Constipation 06/24/2022   Renal cyst, 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   PAC (premature atrial contraction) 03/01/2021   PVC (premature ventricular contraction) 03/01/2021   Atrial bigeminy 02/28/2020   OA (osteoarthritis) of knee 08/07/2019   Primary osteoarthritis of right knee 08/07/2019   Coronary artery disease involving coronary bypass graft of native heart without angina pectoris 09/19/2018   Weakness 08/01/2018   Mixed hyperlipidemia 06/06/2018   S/P left atrial appendage ligation 07/25/2017   S/P AVR 07/25/2017   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   Coronary artery disease without angina pectoris 07/12/2017   HFrEF (heart failure with reduced ejection fraction) (HCC) 07/07/2017   Mitral regurgitation 07/07/2017   (HFpEF) heart failure with preserved ejection fraction (HCC) 07/07/2017   Gout 02/12/2013   Benign hypertension 02/12/2013   GERD (gastroesophageal reflux disease) 02/12/2013    PCP: Jesse Bookman, NP   REFERRING PROVIDER: Ngetich, Donalee Citrin, NP   REFERRING DIAG: unsteady gait, falling episodes   THERAPY DIAG:  Muscle weakness (generalized)  Other abnormalities of gait and mobility  Abnormal posture  Rationale for Evaluation and Treatment: Rehabilitation  ONSET DATE: 06/2022-hospitalization due to back spasms   SUBJECTIVE:   SUBJECTIVE STATEMENT: No longer has back pain.   Eval: PT reports to PT with history of anemia and leukopenia with report of fall while getting off his bike 3 weeks ago. He reports that he was hospitalized due to back spasms in May and spent time time immobilized leading to getting weak.   PERTINENT HISTORY: CAD, CHF, HTN. Aortic valve replacement, TKA 2021 PAIN:  Are you having pain? No  PRECAUTIONS: Fall  RED FLAGS: None   WEIGHT BEARING  RESTRICTIONS: No  FALLS:  Has patient fallen in last 6 months? Yes. Number of falls 1 getting off bike, will address in PT  LIVING ENVIRONMENT: Lives with: lives with their family and lives in an assisted living facility [place] Lives in: House/apartment Stairs: No, has elevator.  Uses steps when able  Has following equipment at home: None  OCCUPATION: retired   PLOF: Independent and Leisure: disc golf, riding bike (not doing this now due to balance) , [employer] 2-3x/wk (weights, TRX)  PATIENT GOALS: improve balance for disc golf, return to cycling without fear of falling,   NEXT MD VISIT: next year  OBJECTIVE:  Note: Objective measures were completed at Evaluation unless otherwise noted.  DIAGNOSTIC FINDINGS: none   COGNITION: Overall cognitive status: Within functional limits for tasks assessed     SENSATION: WFL   MUSCLE LENGTH: 50% limitation in bil hamstrings   POSTURE: rounded shoulders, forward head, and flexed trunk   LOWER EXTREMITY MMT:  MMT Right eval Left eval  Hip flexion 4- 4-  Hip extension 4- 4-  Hip abduction    Hip adduction    Hip internal rotation    Hip external rotation    Knee flexion  4 4  Knee extension 4+ 4+  Ankle dorsiflexion 4+ 4+  Ankle plantarflexion    Ankle inversion    Ankle eversion     (Blank rows = not tested)  FUNCTIONAL TESTS:  11/16/22:  5 times sit to stand: 15.5 seconds  Timed up and go (TUG): 13.13 seconds   Short Physical Performance Battery:    Balance:       Side by side stance:    points (1)-1  Semi -tandem:     points (1)-1  Tandem:      points (2)-0    Gait Speed: (61m=9.84 feet) done 2x take the best time:      points                                        4.11- 3 pts     Repeated Chair Stands:      points   (timer stopped when straight on 5th stand)                                              If used arms=0 pts                                            13.87- 3 pts Total score=    8/12   points <10/12 predictive of 1 or more mobility limitations and increased risk of mobility disability 6 or less/12 associated with a higher fall rate                                                    GAIT: Distance walked: 50 Assistive device utilized: None Level of assistance: Complete Independence Comments: flexed knees and trunk, shortened step length bilaterally    TODAY'S TREATMENT:                                                                                                                              DATE:  11/1822 Bike L 2 x 5 min Seated HS stretch 2x30 sec B Lumbar ext at wall 5 sec hold x 10 Gastroc stretch x 30 sec Sit to stand 10# KB x 7; sit to stand with shoulder retracton x 10 Heel raises x 20 toes straight and out Standing hip ABD Red loop 2x10 B Side stepping red loop x 4 laps Resisted walking Blue Tband around waist 4 steps x 2 B Resisted Disc golf steps with blue band - to simulate routine he  uses  11/16/22 HEP established- see below     PATIENT EDUCATION:  Education details: HEP update: [email or web address] Person educated: Patient Education method: Explanation, Demonstration, and Handouts Education comprehension: verbalized understanding and returned demonstration  HOME EXERCISE PROGRAM: Access Code: [email or web address] URL: [email or web address] Date: 11/25/2022 Prepared by: Raynelle Fanning  Exercises - Seated Hamstring Stretch  - 3 x daily - 7 x weekly - 1 sets - 3 reps - 20 hold - Gastroc Stretch on Wall  - 3 x daily - 7 x weekly - 1 sets - 3 reps - 20 hold - Sit to Stand Without Arm Support  - 2 x daily - 7 x weekly - 1-2 sets - 10 reps - Heel Raises with Counter Support  - 2 x daily - 7 x weekly - 2 sets - 10 reps - Standing Row with Anchored Resistance  - 2 x daily - 7 x weekly - 2 sets - 10 reps - Shoulder extension with resistance - Neutral  - 2 x daily - 7 x weekly - 2 sets - 10 reps - Standing Lumbar Extension at Wall - Forearms  - 2 x daily - 7 x weekly  - 1 sets - 10 reps - 5 sec hold - Standing Hip Abduction with Resistance at Ankles and Counter Support  - 1 x daily - 3-4 x weekly - 3 sets - 10 reps  ASSESSMENT:  CLINICAL IMPRESSION: Patient did well with strengthening today. He demonstrates weakness with B hip ADD when doing resisted side-steps. He will benefit from ongoing SLS activities and general standing hip strengthening. Sit to stand with weight is challenging but he has good form. HEP progressed.  Eval: Patient is a 82 y.o. male who was seen today for physical therapy evaluation and treatment for gait and balance dysfunction secondary to progressing weakening.  Pt is anemic and was also hospitalized in May for back spasms.  Pt reports that he got very weak during that time and he has not yet returned to his baseline.  He was an active cyclist and has not been on his bike since 3 weeks ago when he fell when he was getting off.  Pt also plays disc golf.  Pt with short physical performance battery score of 8/12, <10/12 predictive of 1 or more mobility limitations and increased risk of mobility disability.  Pt with shortened hamstrings and forward flexed posture in standing and walking.  TUG and 5x sit to stand are also indicative of falls risk. Patient will benefit from skilled PT to address the below impairments and improve overall function.    OBJECTIVE IMPAIRMENTS: Abnormal gait, decreased balance, decreased endurance, difficulty walking, decreased ROM, decreased strength, impaired flexibility, and postural dysfunction.   ACTIVITY LIMITATIONS: carrying, standing, squatting, stairs, transfers, and locomotion level  PARTICIPATION LIMITATIONS: cleaning, laundry, shopping, and community activity  PERSONAL FACTORS: Age, Time since onset of injury/illness/exacerbation, and 1-2 comorbidities: anemia, leukopenia  are also affecting patient's functional outcome.   REHAB POTENTIAL: Good  CLINICAL DECISION MAKING:  Stable/uncomplicated  EVALUATION COMPLEXITY: Low   GOALS: Goals reviewed with patient? Yes  SHORT TERM GOALS: Target date: 12/14/2022   Be independent in initial HEP Baseline: Goal status: INITIAL  2.  Perform 5x sit to stand in < or = to 12 seconds to reduce falls risk  Baseline:  Goal status: INITIAL  3.  Improve score on SPPB to > or = to 9/12 to reduce mobility disability  Baseline: 8/12 Goal status: INITIAL  4.  Perform TUG in <  or = to 12 seconds  Baseline:  Goal status: INITIAL     LONG TERM GOALS: Target date: 01/11/2023    Be independent in advanced HEP Baseline:  Goal status: INITIAL  2.  Improve score on SPPB to > or = to 10/12 to reduce mobility disability Baseline:  Goal status: INITIAL  3.  Perform dynamic balance activity with SBA or CGA to improve safety on disc golf course  Baseline:  Goal status: INITIAL  4.  Perform TUG in < or = to 10 seconds to reduce falls risk Baseline:  Goal status: INITIAL   PLAN:  PT FREQUENCY: 2x/week  PT DURATION: 8 weeks  PLANNED INTERVENTIONS: Therapeutic exercises, Therapeutic activity, Neuromuscular re-education, Balance training, Gait training, Patient/Family education, Self Care, Joint mobilization, Joint manipulation, Stair training, Vestibular training, Canalith repositioning, Aquatic Therapy, Dry Needling, Cryotherapy, Moist heat, Taping, and Manual therapy  PLAN FOR NEXT SESSION: review HEP, gait, balance, strength, endurance progression   Solon Palm, PT  11/25/22 10:58 AM

## 2022-11-25 ENCOUNTER — Encounter: Payer: Self-pay | Admitting: Physical Therapy

## 2022-11-25 ENCOUNTER — Ambulatory Visit: Payer: Medicare PPO | Admitting: Physical Therapy

## 2022-11-25 DIAGNOSIS — I10 Essential (primary) hypertension: Secondary | ICD-10-CM | POA: Diagnosis not present

## 2022-11-25 DIAGNOSIS — R293 Abnormal posture: Secondary | ICD-10-CM

## 2022-11-25 DIAGNOSIS — R2689 Other abnormalities of gait and mobility: Secondary | ICD-10-CM

## 2022-11-25 DIAGNOSIS — R296 Repeated falls: Secondary | ICD-10-CM | POA: Diagnosis not present

## 2022-11-25 DIAGNOSIS — R2681 Unsteadiness on feet: Secondary | ICD-10-CM | POA: Diagnosis not present

## 2022-11-25 DIAGNOSIS — M6281 Muscle weakness (generalized): Secondary | ICD-10-CM

## 2022-11-25 LAB — AFP TUMOR MARKER: AFP-Tumor Marker: 1.4 ng/mL (ref <6.1)

## 2022-11-28 NOTE — Progress Notes (Signed)
[name],  As expected, your AFP level was not elevated.  Lets plan to repeat MRI in 1 year to ensure stability of the lesion in your liver.

## 2022-11-30 ENCOUNTER — Ambulatory Visit: Payer: Medicare PPO

## 2022-11-30 DIAGNOSIS — I10 Essential (primary) hypertension: Secondary | ICD-10-CM | POA: Diagnosis not present

## 2022-11-30 DIAGNOSIS — R2681 Unsteadiness on feet: Secondary | ICD-10-CM | POA: Diagnosis not present

## 2022-11-30 DIAGNOSIS — R296 Repeated falls: Secondary | ICD-10-CM | POA: Diagnosis not present

## 2022-11-30 DIAGNOSIS — R293 Abnormal posture: Secondary | ICD-10-CM

## 2022-11-30 DIAGNOSIS — R2689 Other abnormalities of gait and mobility: Secondary | ICD-10-CM

## 2022-11-30 DIAGNOSIS — M6281 Muscle weakness (generalized): Secondary | ICD-10-CM

## 2022-11-30 NOTE — Therapy (Signed)
OUTPATIENT PHYSICAL THERAPY LOWER EXTREMITY TREATMENT   Patient Name: [name] MRN: [id number] DOB:[birth year], 82 y.o., male Today's Date: 11/30/2022  END OF SESSION:  PT End of Session - 11/30/22 1018     Visit Number 3    Date for PT Re-Evaluation 01/11/23    Authorization Type [employer]-10/9-12/4- 16 visits    Authorization - Visit Number 3    Authorization - Number of Visits 16    Progress Note Due on Visit 10    PT Start Time 0933    PT Stop Time 1016    PT Time Calculation (min) 43 min    Activity Tolerance Patient tolerated treatment well    Behavior During Therapy WFL for tasks assessed/performed               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   CHF (congestive heart failure) (HCC)    Elevated cholesterol    Gout    Hepatitis 1964   hepatitis A    HFrEF (heart failure with reduced ejection fraction) (HCC)    Hypertension    Moderate mitral regurgitation    Severe aortic stenosis    Past Surgical History:  Procedure Laterality Date   AORTIC VALVE REPLACEMENT N/A 07/24/2017   Procedure: AORTIC VALVE REPLACEMENT (AVR) using 23mm Inspiris Aortic Valve;  Surgeon: Alleen Borne, MD;  Location: [facility] OR;  Service: Open Heart Surgery;  Laterality: N/A;   CLIPPING OF ATRIAL APPENDAGE N/A 07/24/2017   Procedure: CLIPPING OF ATRIAL APPENDAGE using a 50mm Atricure clip;  Surgeon: Alleen Borne, MD;  Location: [facility] OR;  Service: Open Heart Surgery;  Laterality: N/A;   CORONARY ARTERY BYPASS GRAFT N/A 07/24/2017   Procedure: CORONARY ARTERY BYPASS GRAFTING (CABG) times 3 using the left greater saphenous vein harvested endoscopically and left internal mammary artery.;  Surgeon: Alleen Borne, MD;  Location: [facility] OR;  Service: Open Heart Surgery;  Laterality: N/A;   EYE SURGERY Bilateral    lens replacements for cataracts   HERNIA REPAIR     INGUINAL HERNIA REPAIR Right 05/06/2015   Procedure:  LAPAROSCOPIC RIGHT INGUINAL HERNIA WITH MESH;  Surgeon: Abigail Miyamoto, MD;  Location: [facility] ORS;  Service: General;  Laterality: Right;   INSERTION OF MESH Right 05/06/2015   Procedure: INSERTION OF MESH;  Surgeon: Abigail Miyamoto, MD;  Location: [facility] ORS;  Service: General;  Laterality: Right;   IR BONE MARROW BIOPSY & ASPIRATION  09/15/2022   RIGHT/LEFT HEART CATH AND CORONARY ANGIOGRAPHY N/A 07/10/2017   Procedure: RIGHT/LEFT HEART CATH AND CORONARY ANGIOGRAPHY;  Surgeon: Elder Negus, MD;  Location: [facility] INVASIVE CV LAB;  Service: Cardiovascular;  Laterality: N/A;   TEE WITHOUT CARDIOVERSION N/A 07/11/2017   Procedure: TRANSESOPHAGEAL ECHOCARDIOGRAM (TEE);  Surgeon: Elder Negus, MD;  Location: [facility] ENDOSCOPY;  Service: Cardiovascular;  Laterality: N/A;   TEE WITHOUT CARDIOVERSION N/A 07/24/2017   Procedure: TRANSESOPHAGEAL ECHOCARDIOGRAM (TEE);  Surgeon: Alleen Borne, MD;  Location: [facility] OR;  Service: Open Heart Surgery;  Laterality: N/A;   TEE WITHOUT CARDIOVERSION N/A 08/01/2022   Procedure: TRANSESOPHAGEAL ECHOCARDIOGRAM;  Surgeon: Lewayne Bunting, MD;  Location: [facility] INVASIVE CV LAB;  Service: Cardiovascular;  Laterality: N/A;   TOTAL KNEE ARTHROPLASTY Right 08/07/2019   Procedure: TOTAL KNEE ARTHROPLASTY;  Surgeon: Ollen Gross, MD;  Location: [facility] ORS;  Service: Orthopedics;  Laterality: Right;   ULTRASOUND GUIDANCE FOR VASCULAR ACCESS  07/10/2017  Procedure: Ultrasound Guidance For Vascular Access;  Surgeon: Elder Negus, MD;  Location: [facility] INVASIVE CV LAB;  Service: Cardiovascular;;   Patient Active Problem List   Diagnosis Date Noted   Leukopenia 09/13/2022   PICC (peripherally inserted central catheter) in place 08/30/2022   Pure hypercholesterolemia 08/03/2022   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   Encounter for assessment of peripherally inserted central catheter (PICC) 08/02/2022   Aortic valve endocarditis 08/01/2022   Cardiomyopathy (HCC) 08/01/2022    Chronic HFrEF (heart failure with reduced ejection fraction) (HCC) 08/01/2022   Prosthetic cardiac valve vegetation 08/01/2022   Anemia 08/01/2022   Streptococcal bacteremia 07/29/2022   Aortic atherosclerosis (HCC) 07/29/2022   Medication management 07/29/2022   Bacteremia 07/29/2022   Low back pain 06/24/2022   Constipation 06/24/2022   Renal cyst, 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   PAC (premature atrial contraction) 03/01/2021   PVC (premature ventricular contraction) 03/01/2021   Atrial bigeminy 02/28/2020   OA (osteoarthritis) of knee 08/07/2019   Primary osteoarthritis of right knee 08/07/2019   Coronary artery disease involving coronary bypass graft of native heart without angina pectoris 09/19/2018   Weakness 08/01/2018   Mixed hyperlipidemia 06/06/2018   S/P left atrial appendage ligation 07/25/2017   S/P AVR 07/25/2017   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   Coronary artery disease without angina pectoris 07/12/2017   HFrEF (heart failure with reduced ejection fraction) (HCC) 07/07/2017   Mitral regurgitation 07/07/2017   (HFpEF) heart failure with preserved ejection fraction (HCC) 07/07/2017   Gout 02/12/2013   Benign hypertension 02/12/2013   GERD (gastroesophageal reflux disease) 02/12/2013    PCP: Caesar Bookman, NP   REFERRING PROVIDER: Ngetich, Donalee Citrin, NP   REFERRING DIAG: unsteady gait, falling episodes   THERAPY DIAG:  Muscle weakness (generalized)  Other abnormalities of gait and mobility  Abnormal posture  Rationale for Evaluation and Treatment: Rehabilitation  ONSET DATE: 06/2022-hospitalization due to back spasms   SUBJECTIVE:   SUBJECTIVE STATEMENT: I played disc golf yesterday.   Eval: PT reports to PT with history of anemia and leukopenia with report of fall while getting off his bike 3 weeks ago. He reports that he was hospitalized due to back spasms in May and spent time time immobilized leading to getting weak.   PERTINENT HISTORY: CAD, CHF, HTN. Aortic valve  replacement, TKA 2021 PAIN:  Are you having pain? No  PRECAUTIONS: Fall  RED FLAGS: None   WEIGHT BEARING RESTRICTIONS: No  FALLS:  Has patient fallen in last 6 months? Yes. Number of falls 1 getting off bike, will address in PT  LIVING ENVIRONMENT: Lives with: lives with their family and lives in an assisted living facility [place] Lives in: House/apartment Stairs: No, has elevator.  Uses steps when able  Has following equipment at home: None  OCCUPATION: retired   PLOF: Independent and Leisure: disc golf, riding bike (not doing this now due to balance) , [employer] 2-3x/wk (weights, TRX)  PATIENT GOALS: improve balance for disc golf, return to cycling without fear of falling,   NEXT MD VISIT: next year  OBJECTIVE:  Note: Objective measures were completed at Evaluation unless otherwise noted.  DIAGNOSTIC FINDINGS: none   COGNITION: Overall cognitive status: Within functional limits for tasks assessed     SENSATION: WFL   MUSCLE LENGTH: 50% limitation in bil hamstrings   POSTURE: rounded shoulders, forward head, and flexed trunk   LOWER EXTREMITY MMT:  MMT Right eval Left eval  Hip flexion 4- 4-  Hip extension 4-  4-  Hip abduction    Hip adduction    Hip internal rotation    Hip external rotation    Knee flexion 4 4  Knee extension 4+ 4+  Ankle dorsiflexion 4+ 4+  Ankle plantarflexion    Ankle inversion    Ankle eversion     (Blank rows = not tested)  FUNCTIONAL TESTS:  11/16/22:  5 times sit to stand: 15.5 seconds  Timed up and go (TUG): 13.13 seconds   11/30/22: 5x sit to stand: 10.93 seconds    Short Physical Performance Battery:    Balance:       Side by side stance:    points (1)-1  Semi -tandem:     points (1)-1  Tandem:      points (2)-0    Gait Speed: (93m=9.84 feet) done 2x take the best time:      points                                        4.11- 3 pts     Repeated Chair Stands:      points   (timer stopped when straight on  5th stand)                                              If used arms=0 pts                                            13.87- 3 pts Total score=    8/12  points <10/12 predictive of 1 or more mobility limitations and increased risk of mobility disability 6 or less/12 associated with a higher fall rate                                                    GAIT: Distance walked: 50 Assistive device utilized: None Level of assistance: Complete Independence Comments: flexed knees and trunk, shortened step length bilaterally    TODAY'S TREATMENT:                                                                                                                              DATE:  11/30/22 NuStep Level 6x 6 min- PT present to discuss progress Seated HS stretch 2x30 sec Bili Rockerboard x 3 min Dead lift with 10# KB 2x10 Sit to stand 10# KB 2x10 Shoulder extension and rows: red band 2x10 Heel raises x 20 toes straight and out- standing on balance  pad  Standing hip ABD Red loop 2x10 B Side stepping red loop x 4 laps In // bars: stepping over hurdles and cobble blocks with step to gait-no UE support 11/1822 Bike L 2 x 5 min Seated HS stretch 2x30 sec B Lumbar ext at wall 5 sec hold x 10 Gastroc stretch x 30 sec Sit to stand 10# KB x 7; sit to stand with shoulder retracton x 10 Heel raises x 20 toes straight and out Standing hip ABD Red loop 2x10 B Side stepping red loop x 4 laps Resisted walking Blue Tband around waist 4 steps x 2 B Resisted Disc golf steps with blue band - to simulate routine he uses  11/16/22 HEP established- see below     PATIENT EDUCATION:  Education details: HEP update: [email or web address] Person educated: Patient Education method: Explanation, Demonstration, and Handouts Education comprehension: verbalized understanding and returned demonstration  HOME EXERCISE PROGRAM: Access Code: [email or web address] URL: [email or web address] Date: 11/25/2022 Prepared by:  Raynelle Fanning  Exercises - Seated Hamstring Stretch  - 3 x daily - 7 x weekly - 1 sets - 3 reps - 20 hold - Gastroc Stretch on Wall  - 3 x daily - 7 x weekly - 1 sets - 3 reps - 20 hold - Sit to Stand Without Arm Support  - 2 x daily - 7 x weekly - 1-2 sets - 10 reps - Heel Raises with Counter Support  - 2 x daily - 7 x weekly - 2 sets - 10 reps - Standing Row with Anchored Resistance  - 2 x daily - 7 x weekly - 2 sets - 10 reps - Shoulder extension with resistance - Neutral  - 2 x daily - 7 x weekly - 2 sets - 10 reps - Standing Lumbar Extension at Wall - Forearms  - 2 x daily - 7 x weekly - 1 sets - 10 reps - 5 sec hold - Standing Hip Abduction with Resistance at Ankles and Counter Support  - 1 x daily - 3-4 x weekly - 3 sets - 10 reps  ASSESSMENT:  CLINICAL IMPRESSION: Pt continues to be compliant with HEP and stays active playing disc golf.  He is working on postural corrections in standing.  5x sit to stand time was improved, indicating reduced falls risk.  He did well with advancement of activity in the clinic today. PT monitored pt throughout session for safety and cueing.  Patient will benefit from skilled PT to address the below impairments and improve overall function.   OBJECTIVE IMPAIRMENTS: Abnormal gait, decreased balance, decreased endurance, difficulty walking, decreased ROM, decreased strength, impaired flexibility, and postural dysfunction.   ACTIVITY LIMITATIONS: carrying, standing, squatting, stairs, transfers, and locomotion level  PARTICIPATION LIMITATIONS: cleaning, laundry, shopping, and community activity  PERSONAL FACTORS: Age, Time since onset of injury/illness/exacerbation, and 1-2 comorbidities: anemia, leukopenia  are also affecting patient's functional outcome.   REHAB POTENTIAL: Good  CLINICAL DECISION MAKING: Stable/uncomplicated  EVALUATION COMPLEXITY: Low   GOALS: Goals reviewed with patient? Yes  SHORT TERM GOALS: Target date: 12/14/2022   Be  independent in initial HEP Baseline: Goal status: INITIAL  2.  Perform 5x sit to stand in < or = to 12 seconds to reduce falls risk  Baseline: 10.47 (10/23/2) Goal status: MET  3.  Improve score on SPPB to > or = to 9/12 to reduce mobility disability  Baseline: 8/12 Goal status: INITIAL  4.  Perform TUG in < or = to 12 seconds  Baseline:  Goal status:  INITIAL     LONG TERM GOALS: Target date: 01/11/2023    Be independent in advanced HEP Baseline:  Goal status: INITIAL  2.  Improve score on SPPB to > or = to 10/12 to reduce mobility disability Baseline:  Goal status: INITIAL  3.  Perform dynamic balance activity with SBA or CGA to improve safety on disc golf course  Baseline:  Goal status: INITIAL  4.  Perform TUG in < or = to 10 seconds to reduce falls risk Baseline:  Goal status: INITIAL   PLAN:  PT FREQUENCY: 2x/week  PT DURATION: 8 weeks  PLANNED INTERVENTIONS: Therapeutic exercises, Therapeutic activity, Neuromuscular re-education, Balance training, Gait training, Patient/Family education, Self Care, Joint mobilization, Joint manipulation, Stair training, Vestibular training, Canalith repositioning, Aquatic Therapy, Dry Needling, Cryotherapy, Moist heat, Taping, and Manual therapy  PLAN FOR NEXT SESSION: review HEP, gait, balance, strength, endurance progression   Lorrene Reid, PT 11/30/22 10:21 AM

## 2022-12-01 NOTE — Therapy (Signed)
OUTPATIENT PHYSICAL THERAPY LOWER EXTREMITY TREATMENT   Patient Name: [name] MRN: [id number] DOB:1940-04-05, 82 y.o., male Today's Date: 12/02/2022  END OF SESSION:  PT End of Session - 12/02/22 1048     Visit Number 4    Date for PT Re-Evaluation 01/11/23    Authorization Type [employer]-10/9-12/4- 16 visits    Authorization - Visit Number 4    Authorization - Number of Visits 16    Progress Note Due on Visit 10    PT Start Time 1047    PT Stop Time 1130    PT Time Calculation (min) 43 min    Activity Tolerance Patient tolerated treatment well    Behavior During Therapy WFL for tasks assessed/performed                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   CHF (congestive heart failure) (HCC)    Elevated cholesterol    Gout    Hepatitis 1964   hepatitis A    HFrEF (heart failure with reduced ejection fraction) (HCC)    Hypertension    Moderate mitral regurgitation    Severe aortic stenosis    Past Surgical History:  Procedure Laterality Date   AORTIC VALVE REPLACEMENT N/A 07/24/2017   Procedure: AORTIC VALVE REPLACEMENT (AVR) using 23mm Inspiris Aortic Valve;  Surgeon: Alleen Borne, MD;  Location: [facility] OR;  Service: Open Heart Surgery;  Laterality: N/A;   CLIPPING OF ATRIAL APPENDAGE N/A 07/24/2017   Procedure: CLIPPING OF ATRIAL APPENDAGE using a 50mm Atricure clip;  Surgeon: Alleen Borne, MD;  Location: [facility] OR;  Service: Open Heart Surgery;  Laterality: N/A;   CORONARY ARTERY BYPASS GRAFT N/A 07/24/2017   Procedure: CORONARY ARTERY BYPASS GRAFTING (CABG) times 3 using the left greater saphenous vein harvested endoscopically and left internal mammary artery.;  Surgeon: Alleen Borne, MD;  Location: [facility] OR;  Service: Open Heart Surgery;  Laterality: N/A;   EYE SURGERY Bilateral    lens replacements for cataracts   HERNIA REPAIR     INGUINAL HERNIA REPAIR Right 05/06/2015   Procedure:  LAPAROSCOPIC RIGHT INGUINAL HERNIA WITH MESH;  Surgeon: Abigail Miyamoto, MD;  Location: [facility] ORS;  Service: General;  Laterality: Right;   INSERTION OF MESH Right 05/06/2015   Procedure: INSERTION OF MESH;  Surgeon: Abigail Miyamoto, MD;  Location: [facility] ORS;  Service: General;  Laterality: Right;   IR BONE MARROW BIOPSY & ASPIRATION  09/15/2022   RIGHT/LEFT HEART CATH AND CORONARY ANGIOGRAPHY N/A 07/10/2017   Procedure: RIGHT/LEFT HEART CATH AND CORONARY ANGIOGRAPHY;  Surgeon: Elder Negus, MD;  Location: [facility] INVASIVE CV LAB;  Service: Cardiovascular;  Laterality: N/A;   TEE WITHOUT CARDIOVERSION N/A 07/11/2017   Procedure: TRANSESOPHAGEAL ECHOCARDIOGRAM (TEE);  Surgeon: Elder Negus, MD;  Location: [facility] ENDOSCOPY;  Service: Cardiovascular;  Laterality: N/A;   TEE WITHOUT CARDIOVERSION N/A 07/24/2017   Procedure: TRANSESOPHAGEAL ECHOCARDIOGRAM (TEE);  Surgeon: Alleen Borne, MD;  Location: [facility] OR;  Service: Open Heart Surgery;  Laterality: N/A;   TEE WITHOUT CARDIOVERSION N/A 08/01/2022   Procedure: TRANSESOPHAGEAL ECHOCARDIOGRAM;  Surgeon: Lewayne Bunting, MD;  Location: [facility] INVASIVE CV LAB;  Service: Cardiovascular;  Laterality: N/A;   TOTAL KNEE ARTHROPLASTY Right 08/07/2019   Procedure: TOTAL KNEE ARTHROPLASTY;  Surgeon: Ollen Gross, MD;  Location: [facility] ORS;  Service: Orthopedics;  Laterality: Right;   ULTRASOUND GUIDANCE FOR VASCULAR ACCESS  07/10/2017  Procedure: Ultrasound Guidance For Vascular Access;  Surgeon: Elder Negus, MD;  Location: [facility] INVASIVE CV LAB;  Service: Cardiovascular;;   Patient Active Problem List   Diagnosis Date Noted   Leukopenia 09/13/2022   PICC (peripherally inserted central catheter) in place 08/30/2022   Pure hypercholesterolemia 08/03/2022   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   Encounter for assessment of peripherally inserted central catheter (PICC) 08/02/2022   Aortic valve endocarditis 08/01/2022   Cardiomyopathy (HCC) 08/01/2022    Chronic HFrEF (heart failure with reduced ejection fraction) (HCC) 08/01/2022   Prosthetic cardiac valve vegetation 08/01/2022   Anemia 08/01/2022   Streptococcal bacteremia 07/29/2022   Aortic atherosclerosis (HCC) 07/29/2022   Medication management 07/29/2022   Bacteremia 07/29/2022   Low back pain 06/24/2022   Constipation 06/24/2022   Renal cyst, 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   PAC (premature atrial contraction) 03/01/2021   PVC (premature ventricular contraction) 03/01/2021   Atrial bigeminy 02/28/2020   OA (osteoarthritis) of knee 08/07/2019   Primary osteoarthritis of right knee 08/07/2019   Coronary artery disease involving coronary bypass graft of native heart without angina pectoris 09/19/2018   Weakness 08/01/2018   Mixed hyperlipidemia 06/06/2018   S/P left atrial appendage ligation 07/25/2017   S/P AVR 07/25/2017   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   Coronary artery disease without angina pectoris 07/12/2017   HFrEF (heart failure with reduced ejection fraction) (HCC) 07/07/2017   Mitral regurgitation 07/07/2017   (HFpEF) heart failure with preserved ejection fraction (HCC) 07/07/2017   Gout 02/12/2013   Benign hypertension 02/12/2013   GERD (gastroesophageal reflux disease) 02/12/2013    PCP: Caesar Bookman, NP   REFERRING PROVIDER: Ngetich, Donalee Citrin, NP   REFERRING DIAG: unsteady gait, falling episodes   THERAPY DIAG:  Muscle weakness (generalized)  Other abnormalities of gait and mobility  Abnormal posture  Rationale for Evaluation and Treatment: Rehabilitation  ONSET DATE: 06/2022-hospitalization due to back spasms   SUBJECTIVE:   SUBJECTIVE STATEMENT: Playing disc golf today.  Eval: PT reports to PT with history of anemia and leukopenia with report of fall while getting off his bike 3 weeks ago. He reports that he was hospitalized due to back spasms in May and spent time time immobilized leading to getting weak.   PERTINENT HISTORY: CAD, CHF, HTN. Aortic valve  replacement, TKA 2021 PAIN:  Are you having pain? No  PRECAUTIONS: Fall  RED FLAGS: None   WEIGHT BEARING RESTRICTIONS: No  FALLS:  Has patient fallen in last 6 months? Yes. Number of falls 1 getting off bike, will address in PT  LIVING ENVIRONMENT: Lives with: lives with their family and lives in an assisted living facility [place] Lives in: House/apartment Stairs: No, has elevator.  Uses steps when able  Has following equipment at home: None  OCCUPATION: retired   PLOF: Independent and Leisure: disc golf, riding bike (not doing this now due to balance) , [employer] 2-3x/wk (weights, TRX)  PATIENT GOALS: improve balance for disc golf, return to cycling without fear of falling,   NEXT MD VISIT: next year  OBJECTIVE:  Note: Objective measures were completed at Evaluation unless otherwise noted.  DIAGNOSTIC FINDINGS: none   COGNITION: Overall cognitive status: Within functional limits for tasks assessed     SENSATION: WFL   MUSCLE LENGTH: 50% limitation in bil hamstrings   POSTURE: rounded shoulders, forward head, and flexed trunk   LOWER EXTREMITY MMT:  MMT Right eval Left eval  Hip flexion 4- 4-  Hip extension 4- 4-  Hip abduction    Hip adduction    Hip internal rotation    Hip external rotation    Knee flexion 4 4  Knee extension 4+ 4+  Ankle dorsiflexion 4+ 4+  Ankle plantarflexion    Ankle inversion    Ankle eversion     (Blank rows = not tested)  FUNCTIONAL TESTS:  11/16/22:  5 times sit to stand: 15.5 seconds  Timed up and go (TUG): 13.13 seconds   11/30/22: 5x sit to stand: 10.93 seconds    Short Physical Performance Battery:    Balance:       Side by side stance:    points (1)-1  Semi -tandem:     points (1)-1  Tandem:      points (2)-0    Gait Speed: (55m=9.84 feet) done 2x take the best time:      points                                        4.11- 3 pts     Repeated Chair Stands:      points   (timer stopped when straight on  5th stand)                                              If used arms=0 pts                                            13.87- 3 pts Total score=    8/12  points <10/12 predictive of 1 or more mobility limitations and increased risk of mobility disability 6 or less/12 associated with a higher fall rate                                                    GAIT: Distance walked: 50 Assistive device utilized: None Level of assistance: Complete Independence Comments: flexed knees and trunk, shortened step length bilaterally    TODAY'S TREATMENT:                                                                                                                              DATE:  12/02/22 Bike L4 x 5 min Seated HS stretch 2x30 sec B Dynamic HS stretch at counter x 10  Wide legged bent over HS stretch to mat table BOSU flat side up x 2 min Dead lift with 10# KB 2x10 Sit to stand with upright row 10# KB 2x10 Heel raises  x 20 toes straight - standing on balance pad - cues for pressing through great toes also. Resisted walking 10# x 5 reps 4 directions  11/30/22 NuStep Level 6x 6 min- PT present to discuss progress Seated HS stretch 2x30 sec Bili Rockerboard x 3 min Dead lift with 10# KB 2x10 Sit to stand 10# KB 2x10 Shoulder extension and rows: red band 2x10 Heel raises x 20 toes straight and out- standing on balance pad  Standing hip ABD Red loop 2x10 B Side stepping red loop x 4 laps In // bars: stepping over hurdles and cobble blocks with step to gait-no UE support  11/1822 Bike L 2 x 5 min Seated HS stretch 2x30 sec B Lumbar ext at wall 5 sec hold x 10 Gastroc stretch x 30 sec Sit to stand 10# KB x 7; sit to stand with shoulder retracton x 10 Heel raises x 20 toes straight and out Standing hip ABD Red loop 2x10 B Side stepping red loop x 4 laps Resisted walking Blue Tband around waist 4 steps x 2 B Resisted Disc golf steps with blue band - to simulate routine he  uses  11/16/22 HEP established- see below     PATIENT EDUCATION:  Education details: HEP update: [email or web address] Person educated: Patient Education method: Explanation, Demonstration, and Handouts Education comprehension: verbalized understanding and returned demonstration  HOME EXERCISE PROGRAM: Access Code: [email or web address] Access Code: [email or web address] URL: [email or web address] Date: 12/02/2022 Prepared by: Raynelle Fanning  Exercises - Seated Hamstring Stretch  - 3 x daily - 7 x weekly - 1 sets - 3 reps - 20 hold - Gastroc Stretch on Wall  - 3 x daily - 7 x weekly - 1 sets - 3 reps - 20 hold - Sit to Stand Without Arm Support  - 2 x daily - 7 x weekly - 1-2 sets - 10 reps - Heel Raises with Counter Support  - 2 x daily - 7 x weekly - 2 sets - 10 reps - Standing Row with Anchored Resistance  - 2 x daily - 7 x weekly - 2 sets - 10 reps - Shoulder extension with resistance - Neutral  - 2 x daily - 7 x weekly - 2 sets - 10 reps - Standing Lumbar Extension at Wall - Forearms  - 2 x daily - 7 x weekly - 1 sets - 10 reps - 5 sec hold - Standing Hip Abduction with Resistance at Ankles and Counter Support  - 1 x daily - 3 x weekly - 3 sets - 10 reps - Heel Raise  - 1 x daily - 3-4 x weekly - 2-3 sets - 10 reps - Dynamic HS Stretch  - 1-2 x daily - 3-4 x weekly - 1 sets - 10 reps - Standing Feet Apart Firm Surface of Bosu Ball in [place] With Eyes Open  - 1 x daily - 3-4 x weekly - 1 sets - 10 repsURL: [email or web address] Date: 11/25/2022 Prepared by: Raynelle Fanning  Exercises - Seated Hamstring Stretch  - 3 x daily - 7 x weekly - 1 sets - 3 reps - 20 hold - Gastroc Stretch on Wall  - 3 x daily - 7 x weekly - 1 sets - 3 reps - 20 hold - Sit to Stand Without Arm Support  - 2 x daily - 7 x weekly - 1-2 sets - 10 reps - Heel Raises with Counter Support  - 2 x daily - 7 x weekly - 2 sets - 10 reps - Standing Row  with Anchored Resistance  - 2 x daily - 7 x weekly - 2 sets - 10 reps - Shoulder  extension with resistance - Neutral  - 2 x daily - 7 x weekly - 2 sets - 10 reps - Standing Lumbar Extension at Wall - Forearms  - 2 x daily - 7 x weekly - 1 sets - 10 reps - 5 sec hold - Standing Hip Abduction with Resistance at Ankles and Counter Support  - 1 x daily - 3-4 x weekly - 3 sets - 10 reps  ASSESSMENT:  CLINICAL IMPRESSION: [name] was challenged with the BOSU and heel raises on foam. He felt the resisted walking was good hip and core strengthening. He plans to get a BOSU, so balance added to HEP. [name] is planning to play disc golf this afternoon. He stated he felt invigorated at the end of his session today!  OBJECTIVE IMPAIRMENTS: Abnormal gait, decreased balance, decreased endurance, difficulty walking, decreased ROM, decreased strength, impaired flexibility, and postural dysfunction.   ACTIVITY LIMITATIONS: carrying, standing, squatting, stairs, transfers, and locomotion level  PARTICIPATION LIMITATIONS: cleaning, laundry, shopping, and community activity  PERSONAL FACTORS: Age, Time since onset of injury/illness/exacerbation, and 1-2 comorbidities: anemia, leukopenia  are also affecting patient's functional outcome.   REHAB POTENTIAL: Good  CLINICAL DECISION MAKING: Stable/uncomplicated  EVALUATION COMPLEXITY: Low   GOALS: Goals reviewed with patient? Yes  SHORT TERM GOALS: Target date: 12/14/2022   Be independent in initial HEP Baseline: Goal status: INITIAL  2.  Perform 5x sit to stand in < or = to 12 seconds to reduce falls risk  Baseline: 10.47 (10/23/2) Goal status: MET  3.  Improve score on SPPB to > or = to 9/12 to reduce mobility disability  Baseline: 8/12 Goal status: INITIAL  4.  Perform TUG in < or = to 12 seconds  Baseline:  Goal status: INITIAL     LONG TERM GOALS: Target date: 01/11/2023    Be independent in advanced HEP Baseline:  Goal status: INITIAL  2.  Improve score on SPPB to > or = to 10/12 to reduce mobility disability Baseline:   Goal status: INITIAL  3.  Perform dynamic balance activity with SBA or CGA to improve safety on disc golf course  Baseline:  Goal status: INITIAL  4.  Perform TUG in < or = to 10 seconds to reduce falls risk Baseline:  Goal status: INITIAL   PLAN:  PT FREQUENCY: 2x/week  PT DURATION: 8 weeks  PLANNED INTERVENTIONS: Therapeutic exercises, Therapeutic activity, Neuromuscular re-education, Balance training, Gait training, Patient/Family education, Self Care, Joint mobilization, Joint manipulation, Stair training, Vestibular training, Canalith repositioning, Aquatic Therapy, Dry Needling, Cryotherapy, Moist heat, Taping, and Manual therapy  PLAN FOR NEXT SESSION: review HEP, gait, balance, strength, endurance progression   Solon Palm, PT  12/02/22 11:41 AM

## 2022-12-02 ENCOUNTER — Encounter: Payer: Self-pay | Admitting: Physical Therapy

## 2022-12-02 ENCOUNTER — Ambulatory Visit: Payer: Medicare PPO | Admitting: Physical Therapy

## 2022-12-02 DIAGNOSIS — R2689 Other abnormalities of gait and mobility: Secondary | ICD-10-CM

## 2022-12-02 DIAGNOSIS — R2681 Unsteadiness on feet: Secondary | ICD-10-CM | POA: Diagnosis not present

## 2022-12-02 DIAGNOSIS — R296 Repeated falls: Secondary | ICD-10-CM | POA: Diagnosis not present

## 2022-12-02 DIAGNOSIS — R293 Abnormal posture: Secondary | ICD-10-CM | POA: Diagnosis not present

## 2022-12-02 DIAGNOSIS — I10 Essential (primary) hypertension: Secondary | ICD-10-CM | POA: Diagnosis not present

## 2022-12-02 DIAGNOSIS — M6281 Muscle weakness (generalized): Secondary | ICD-10-CM

## 2022-12-05 ENCOUNTER — Inpatient Hospital Stay: Payer: Medicare PPO | Admitting: Oncology

## 2022-12-05 ENCOUNTER — Inpatient Hospital Stay: Payer: Medicare PPO

## 2022-12-13 ENCOUNTER — Ambulatory Visit: Payer: Medicare PPO | Attending: Family

## 2022-12-13 DIAGNOSIS — R2689 Other abnormalities of gait and mobility: Secondary | ICD-10-CM

## 2022-12-13 DIAGNOSIS — M6281 Muscle weakness (generalized): Secondary | ICD-10-CM

## 2022-12-13 DIAGNOSIS — R293 Abnormal posture: Secondary | ICD-10-CM

## 2022-12-13 NOTE — Therapy (Signed)
OUTPATIENT PHYSICAL THERAPY LOWER EXTREMITY TREATMENT   Patient Name: [name] MRN: [id number] DOB:10/25/1940, 82 y.o., male Today's Date: 12/13/2022  END OF SESSION:  PT End of Session - 12/13/22 1054     Visit Number 5    Date for PT Re-Evaluation 01/11/23    Authorization Type [employer]-10/9-12/4- 16 visits    Authorization - Visit Number 5    Authorization - Number of Visits 16    Progress Note Due on Visit 10    PT Start Time 1017    PT Stop Time 1056    PT Time Calculation (min) 39 min    Activity Tolerance Patient tolerated treatment well    Behavior During Therapy WFL for tasks assessed/performed                 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   CHF (congestive heart failure) (HCC)    Elevated cholesterol    Gout    Hepatitis 1964   hepatitis A    HFrEF (heart failure with reduced ejection fraction) (HCC)    Hypertension    Moderate mitral regurgitation    Severe aortic stenosis    Past Surgical History:  Procedure Laterality Date   AORTIC VALVE REPLACEMENT N/A 07/24/2017   Procedure: AORTIC VALVE REPLACEMENT (AVR) using 23mm Inspiris Aortic Valve;  Surgeon: Alleen Borne, MD;  Location: [facility] OR;  Service: Open Heart Surgery;  Laterality: N/A;   CLIPPING OF ATRIAL APPENDAGE N/A 07/24/2017   Procedure: CLIPPING OF ATRIAL APPENDAGE using a 50mm Atricure clip;  Surgeon: Alleen Borne, MD;  Location: [facility] OR;  Service: Open Heart Surgery;  Laterality: N/A;   CORONARY ARTERY BYPASS GRAFT N/A 07/24/2017   Procedure: CORONARY ARTERY BYPASS GRAFTING (CABG) times 3 using the left greater saphenous vein harvested endoscopically and left internal mammary artery.;  Surgeon: Alleen Borne, MD;  Location: [facility] OR;  Service: Open Heart Surgery;  Laterality: N/A;   EYE SURGERY Bilateral    lens replacements for cataracts   HERNIA REPAIR     INGUINAL HERNIA REPAIR Right 05/06/2015   Procedure:  LAPAROSCOPIC RIGHT INGUINAL HERNIA WITH MESH;  Surgeon: Abigail Miyamoto, MD;  Location: [facility] ORS;  Service: General;  Laterality: Right;   INSERTION OF MESH Right 05/06/2015   Procedure: INSERTION OF MESH;  Surgeon: Abigail Miyamoto, MD;  Location: [facility] ORS;  Service: General;  Laterality: Right;   IR BONE MARROW BIOPSY & ASPIRATION  09/15/2022   RIGHT/LEFT HEART CATH AND CORONARY ANGIOGRAPHY N/A 07/10/2017   Procedure: RIGHT/LEFT HEART CATH AND CORONARY ANGIOGRAPHY;  Surgeon: Elder Negus, MD;  Location: [facility] INVASIVE CV LAB;  Service: Cardiovascular;  Laterality: N/A;   TEE WITHOUT CARDIOVERSION N/A 07/11/2017   Procedure: TRANSESOPHAGEAL ECHOCARDIOGRAM (TEE);  Surgeon: Elder Negus, MD;  Location: [facility] ENDOSCOPY;  Service: Cardiovascular;  Laterality: N/A;   TEE WITHOUT CARDIOVERSION N/A 07/24/2017   Procedure: TRANSESOPHAGEAL ECHOCARDIOGRAM (TEE);  Surgeon: Alleen Borne, MD;  Location: [facility] OR;  Service: Open Heart Surgery;  Laterality: N/A;   TEE WITHOUT CARDIOVERSION N/A 08/01/2022   Procedure: TRANSESOPHAGEAL ECHOCARDIOGRAM;  Surgeon: Lewayne Bunting, MD;  Location: [facility] INVASIVE CV LAB;  Service: Cardiovascular;  Laterality: N/A;   TOTAL KNEE ARTHROPLASTY Right 08/07/2019   Procedure: TOTAL KNEE ARTHROPLASTY;  Surgeon: Ollen Gross, MD;  Location: [facility] ORS;  Service: Orthopedics;  Laterality: Right;   ULTRASOUND GUIDANCE FOR VASCULAR ACCESS  07/10/2017   Procedure: Ultrasound Guidance For Vascular Access;  Surgeon: Elder Negus, MD;  Location: [facility] INVASIVE CV LAB;  Service: Cardiovascular;;   Patient Active Problem List   Diagnosis Date Noted   Leukopenia 09/13/2022   PICC (peripherally inserted central catheter) in place 08/30/2022   Pure hypercholesterolemia 08/03/2022   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   Encounter for assessment of peripherally inserted central catheter (PICC) 08/02/2022   Aortic valve endocarditis 08/01/2022   Cardiomyopathy (HCC) 08/01/2022    Chronic HFrEF (heart failure with reduced ejection fraction) (HCC) 08/01/2022   Prosthetic cardiac valve vegetation 08/01/2022   Anemia 08/01/2022   Streptococcal bacteremia 07/29/2022   Aortic atherosclerosis (HCC) 07/29/2022   Medication management 07/29/2022   Bacteremia 07/29/2022   Low back pain 06/24/2022   Constipation 06/24/2022   Renal cyst, 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   PAC (premature atrial contraction) 03/01/2021   PVC (premature ventricular contraction) 03/01/2021   Atrial bigeminy 02/28/2020   OA (osteoarthritis) of knee 08/07/2019   Primary osteoarthritis of right knee 08/07/2019   Coronary artery disease involving coronary bypass graft of native heart without angina pectoris 09/19/2018   Weakness 08/01/2018   Mixed hyperlipidemia 06/06/2018   S/P left atrial appendage ligation 07/25/2017   S/P AVR 07/25/2017   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   Coronary artery disease without angina pectoris 07/12/2017   HFrEF (heart failure with reduced ejection fraction) (HCC) 07/07/2017   Mitral regurgitation 07/07/2017   (HFpEF) heart failure with preserved ejection fraction (HCC) 07/07/2017   Gout 02/12/2013   Benign hypertension 02/12/2013   GERD (gastroesophageal reflux disease) 02/12/2013    PCP: Caesar Bookman, NP   REFERRING PROVIDER: Ngetich, Donalee Citrin, NP   REFERRING DIAG: unsteady gait, falling episodes   THERAPY DIAG:  Muscle weakness (generalized)  Other abnormalities of gait and mobility  Abnormal posture  Rationale for Evaluation and Treatment: Rehabilitation  ONSET DATE: 06/2022-hospitalization due to back spasms   SUBJECTIVE:   SUBJECTIVE STATEMENT: I've been doing a lot of exercises.  Using Bosu at the gym and I have a foam pad for home.   Eval: PT reports to PT with history of anemia and leukopenia with report of fall while getting off his bike 3 weeks ago. He reports that he was hospitalized due to back spasms in May and spent time time immobilized leading to  getting weak.   PERTINENT HISTORY: CAD, CHF, HTN. Aortic valve replacement, TKA 2021 PAIN:  Are you having pain? No  PRECAUTIONS: Fall  RED FLAGS: None   WEIGHT BEARING RESTRICTIONS: No  FALLS:  Has patient fallen in last 6 months? Yes. Number of falls 1 getting off bike, will address in PT  LIVING ENVIRONMENT: Lives with: lives with their family and lives in an assisted living facility [place] Lives in: House/apartment Stairs: No, has elevator.  Uses steps when able  Has following equipment at home: None  OCCUPATION: retired   PLOF: Independent and Leisure: disc golf, riding bike (not doing this now due to balance) , [employer] 2-3x/wk (weights, TRX)  PATIENT GOALS: improve balance for disc golf, return to cycling without fear of falling,   NEXT MD VISIT: next year  OBJECTIVE:  Note: Objective measures were completed at Evaluation unless otherwise noted.  DIAGNOSTIC FINDINGS: none   COGNITION: Overall cognitive status: Within functional limits for tasks assessed     SENSATION: WFL   MUSCLE LENGTH: 50% limitation in bil hamstrings   POSTURE: rounded shoulders, forward head, and flexed trunk  LOWER EXTREMITY MMT:  MMT Right eval Left eval  Hip flexion 4- 4-  Hip extension 4- 4-  Hip abduction    Hip adduction    Hip internal rotation    Hip external rotation    Knee flexion 4 4  Knee extension 4+ 4+  Ankle dorsiflexion 4+ 4+  Ankle plantarflexion    Ankle inversion    Ankle eversion     (Blank rows = not tested)  FUNCTIONAL TESTS:  11/16/22:  5 times sit to stand: 15.5 seconds  Timed up and go (TUG): 13.13 seconds   11/30/22: 5x sit to stand: 10.93 seconds   12/13/22: TUG: 10.68 seconds    Short Physical Performance Battery:    Balance:       Side by side stance:    points (1)-1  Semi -tandem:     points (1)-1  Tandem:      points (2)-0    Gait Speed: (36m=9.84 feet) done 2x take the best time:      points                                         4.11- 3 pts     Repeated Chair Stands:      points   (timer stopped when straight on 5th stand)                                              If used arms=0 pts                                            13.87- 3 pts Total score=    8/12  points <10/12 predictive of 1 or more mobility limitations and increased risk of mobility disability 6 or less/12 associated with a higher fall rate                                                    GAIT: Distance walked: 50 Assistive device utilized: None Level of assistance: Complete Independence Comments: flexed knees and trunk, shortened step length bilaterally    TODAY'S TREATMENT:                                                                                                                              DATE:  12/13/22 NuStep: Level 5x 6 minutes  Leg press: seat 9 bil legs 105# 2x10, Single leg 60#  Seated HS stretch 2x30 sec B Lunge to  Bosu 2x10 bil Step up and over bosu at [facility] x10 BOSU flat side up x 2 min Dead lift with 10# KB 2x10 Sit to stand with upright row 10# KB 2x10 Heel raises x 20 toes straight - standing on balance pad - cues for pressing through great toes also.   12/02/22 Bike L4 x 5 min Seated HS stretch 2x30 sec B Dynamic HS stretch at counter x 10  Wide legged bent over HS stretch to mat table BOSU flat side up x 2 min Dead lift with 10# KB 2x10 Sit to stand with upright row 10# KB 2x10 Heel raises x 20 toes straight - standing on balance pad - cues for pressing through great toes also. Resisted walking 10# x 5 reps 4 directions  11/30/22 NuStep Level 6x 6 min- PT present to discuss progress Seated HS stretch 2x30 sec Bili Rockerboard x 3 min Dead lift with 10# KB 2x10 Sit to stand 10# KB 2x10 Shoulder extension and rows: red band 2x10 Heel raises x 20 toes straight and out- standing on balance pad  Standing hip ABD Red loop 2x10 B Side stepping red loop x 4 laps In // bars: stepping over hurdles  and cobble blocks with step to gait-no UE support   PATIENT EDUCATION:  Education details: HEP update: [email or web address] Person educated: Patient Education method: Explanation, Demonstration, and Handouts Education comprehension: verbalized understanding and returned demonstration  HOME EXERCISE PROGRAM: Access Code: [email or web address] URL: [email or web address] Date: 12/13/2022 Prepared by: Tresa Endo  Exercises - Seated Hamstring Stretch  - 3 x daily - 7 x weekly - 1 sets - 3 reps - 20 hold - Gastroc Stretch on Wall  - 3 x daily - 7 x weekly - 1 sets - 3 reps - 20 hold - Sit to Stand Without Arm Support  - 2 x daily - 7 x weekly - 1-2 sets - 10 reps - Heel Raises with Counter Support  - 2 x daily - 7 x weekly - 2 sets - 10 reps - Standing Row with Anchored Resistance  - 2 x daily - 7 x weekly - 2 sets - 10 reps - Shoulder extension with resistance - Neutral  - 2 x daily - 7 x weekly - 2 sets - 10 reps - Standing Lumbar Extension at Wall - Forearms  - 2 x daily - 7 x weekly - 1 sets - 10 reps - 5 sec hold - Standing Hip Abduction with Resistance at Ankles and Counter Support  - 1 x daily - 3 x weekly - 3 sets - 10 reps - Heel Raise  - 1 x daily - 3-4 x weekly - 2-3 sets - 10 reps - Dynamic HS Stretch  - 1-2 x daily - 3-4 x weekly - 1 sets - 10 reps - Standing Feet Apart Firm Surface of Bosu Ball in [employer] With Eyes Open  - 1 x daily - 3-4 x weekly - 1 sets - 10 reps - Lunge Onto BOSU Ball  - 1 x daily - 7 x weekly - 2 sets - 10 reps - Lateral Step Up and Overs on BOSU  - 1 x daily - 7 x weekly - 1 sets - 10 reps - Full Leg Press  - 1 x daily - 7 x weekly - 3 sets - 10 reps  ASSESSMENT:  CLINICAL IMPRESSION: Pt reports that he feels more confident and might get a bike soon.  He reports increased ease with getting objects from the floor now.  He remains very active and asks for advancement each session.  Improved posture overall since the start of care. He did well with leg press and advanced  Bosu exercises and visuals were provided.  PT provided close supervision and verbal cues for correct technique.    OBJECTIVE IMPAIRMENTS: Abnormal gait, decreased balance, decreased endurance, difficulty walking, decreased ROM, decreased strength, impaired flexibility, and postural dysfunction.   ACTIVITY LIMITATIONS: carrying, standing, squatting, stairs, transfers, and locomotion level  PARTICIPATION LIMITATIONS: cleaning, laundry, shopping, and community activity  PERSONAL FACTORS: Age, Time since onset of injury/illness/exacerbation, and 1-2 comorbidities: anemia, leukopenia  are also affecting patient's functional outcome.   REHAB POTENTIAL: Good  CLINICAL DECISION MAKING: Stable/uncomplicated  EVALUATION COMPLEXITY: Low   GOALS: Goals reviewed with patient? Yes  SHORT TERM GOALS: Target date: 12/14/2022   Be independent in initial HEP Baseline: Goal status: MET   2.  Perform 5x sit to stand in < or = to 12 seconds to reduce falls risk  Baseline: 10.47 (10/23/2) Goal status: MET  3.  Improve score on SPPB to > or = to 9/12 to reduce mobility disability  Baseline: 8/12 Goal status: INITIAL  4.  Perform TUG in < or = to 12 seconds  Baseline: 10.68 (12/13/22) Goal status: MET     LONG TERM GOALS: Target date: 01/11/2023    Be independent in advanced HEP Baseline:  Goal status: INITIAL  2.  Improve score on SPPB to > or = to 10/12 to reduce mobility disability Baseline:  Goal status: INITIAL  3.  Perform dynamic balance activity with SBA or CGA to improve safety on disc golf course  Baseline:  Goal status: INITIAL  4.  Perform TUG in < or = to 10 seconds to reduce falls risk Baseline: 10.68 (12/13/22) Goal status: INITIAL   PLAN:  PT FREQUENCY: 2x/week  PT DURATION: 8 weeks  PLANNED INTERVENTIONS: Therapeutic exercises, Therapeutic activity, Neuromuscular re-education, Balance training, Gait training, Patient/Family education, Self Care, Joint  mobilization, Joint manipulation, Stair training, Vestibular training, Canalith repositioning, Aquatic Therapy, Dry Needling, Cryotherapy, Moist heat, Taping, and Manual therapy  PLAN FOR NEXT SESSION: review HEP, gait, balance, strength, endurance progression. Test SPPB for STG  Lorrene Reid, PT 12/13/22 10:56 AM

## 2022-12-15 ENCOUNTER — Ambulatory Visit: Payer: Medicare PPO

## 2022-12-15 DIAGNOSIS — R293 Abnormal posture: Secondary | ICD-10-CM

## 2022-12-15 DIAGNOSIS — M6281 Muscle weakness (generalized): Secondary | ICD-10-CM

## 2022-12-15 DIAGNOSIS — R2689 Other abnormalities of gait and mobility: Secondary | ICD-10-CM

## 2022-12-15 NOTE — Therapy (Signed)
OUTPATIENT PHYSICAL THERAPY LOWER EXTREMITY TREATMENT   Patient Name: [name] MRN: [id number] DOB:[birth year], 82 y.o., male Today's Date: 12/15/2022  END OF SESSION:  PT End of Session - 12/15/22 1144     Visit Number 6    Date for PT Re-Evaluation 01/11/23    Authorization Type [employer]-10/9-12/4- 16 visits    Authorization - Visit Number 6    Authorization - Number of Visits 16    Progress Note Due on Visit 10    PT Start Time 1102    PT Stop Time 1144    PT Time Calculation (min) 42 min    Activity Tolerance Patient tolerated treatment well    Behavior During Therapy WFL for tasks assessed/performed                  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   CHF (congestive heart failure) (HCC)    Elevated cholesterol    Gout    Hepatitis 1964   hepatitis A    HFrEF (heart failure with reduced ejection fraction) (HCC)    Hypertension    Moderate mitral regurgitation    Severe aortic stenosis    Past Surgical History:  Procedure Laterality Date   AORTIC VALVE REPLACEMENT N/A 07/24/2017   Procedure: AORTIC VALVE REPLACEMENT (AVR) using 23mm Inspiris Aortic Valve;  Surgeon: Alleen Borne, MD;  Location: [facility] OR;  Service: Open Heart Surgery;  Laterality: N/A;   CLIPPING OF ATRIAL APPENDAGE N/A 07/24/2017   Procedure: CLIPPING OF ATRIAL APPENDAGE using a 50mm Atricure clip;  Surgeon: Alleen Borne, MD;  Location: [facility] OR;  Service: Open Heart Surgery;  Laterality: N/A;   CORONARY ARTERY BYPASS GRAFT N/A 07/24/2017   Procedure: CORONARY ARTERY BYPASS GRAFTING (CABG) times 3 using the left greater saphenous vein harvested endoscopically and left internal mammary artery.;  Surgeon: Alleen Borne, MD;  Location: [facility] OR;  Service: Open Heart Surgery;  Laterality: N/A;   EYE SURGERY Bilateral    lens replacements for cataracts   HERNIA REPAIR     INGUINAL HERNIA REPAIR Right 05/06/2015   Procedure:  LAPAROSCOPIC RIGHT INGUINAL HERNIA WITH MESH;  Surgeon: Abigail Miyamoto, MD;  Location: [facility] ORS;  Service: General;  Laterality: Right;   INSERTION OF MESH Right 05/06/2015   Procedure: INSERTION OF MESH;  Surgeon: Abigail Miyamoto, MD;  Location: [facility] ORS;  Service: General;  Laterality: Right;   IR BONE MARROW BIOPSY & ASPIRATION  09/15/2022   RIGHT/LEFT HEART CATH AND CORONARY ANGIOGRAPHY N/A 07/10/2017   Procedure: RIGHT/LEFT HEART CATH AND CORONARY ANGIOGRAPHY;  Surgeon: Elder Negus, MD;  Location: [facility] INVASIVE CV LAB;  Service: Cardiovascular;  Laterality: N/A;   TEE WITHOUT CARDIOVERSION N/A 07/11/2017   Procedure: TRANSESOPHAGEAL ECHOCARDIOGRAM (TEE);  Surgeon: Elder Negus, MD;  Location: [facility] ENDOSCOPY;  Service: Cardiovascular;  Laterality: N/A;   TEE WITHOUT CARDIOVERSION N/A 07/24/2017   Procedure: TRANSESOPHAGEAL ECHOCARDIOGRAM (TEE);  Surgeon: Alleen Borne, MD;  Location: [facility] OR;  Service: Open Heart Surgery;  Laterality: N/A;   TEE WITHOUT CARDIOVERSION N/A 08/01/2022   Procedure: TRANSESOPHAGEAL ECHOCARDIOGRAM;  Surgeon: Lewayne Bunting, MD;  [facility] INVASIVE CV LAB;  Service: Cardiovascular;  Laterality: N/A;   TOTAL KNEE ARTHROPLASTY Right 08/07/2019   Procedure: TOTAL KNEE ARTHROPLASTY;  Surgeon: Ollen Gross, MD;  Location: [facility] ORS;  Service: Orthopedics;  Laterality: Right;   ULTRASOUND GUIDANCE FOR VASCULAR ACCESS  07/10/2017   Procedure: Ultrasound Guidance For Vascular Access;  Surgeon: Elder Negus, MD;  Location: [facility] INVASIVE CV LAB;  Service: Cardiovascular;;   Patient Active Problem List   Diagnosis Date Noted   Leukopenia 09/13/2022   PICC (peripherally inserted central catheter) in place 08/30/2022   Pure hypercholesterolemia 08/03/2022   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   Encounter for assessment of peripherally inserted central catheter (PICC) 08/02/2022   Aortic valve endocarditis 08/01/2022   Cardiomyopathy (HCC) 08/01/2022    Chronic HFrEF (heart failure with reduced ejection fraction) (HCC) 08/01/2022   Prosthetic cardiac valve vegetation 08/01/2022   Anemia 08/01/2022   Streptococcal bacteremia 07/29/2022   Aortic atherosclerosis (HCC) 07/29/2022   Medication management 07/29/2022   Bacteremia 07/29/2022   Low back pain 06/24/2022   Constipation 06/24/2022   Renal cyst, 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   PAC (premature atrial contraction) 03/01/2021   PVC (premature ventricular contraction) 03/01/2021   Atrial bigeminy 02/28/2020   OA (osteoarthritis) of knee 08/07/2019   Primary osteoarthritis of right knee 08/07/2019   Coronary artery disease involving coronary bypass graft of native heart without angina pectoris 09/19/2018   Weakness 08/01/2018   Mixed hyperlipidemia 06/06/2018   S/P left atrial appendage ligation 07/25/2017   S/P AVR 07/25/2017   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   Coronary artery disease without angina pectoris 07/12/2017   HFrEF (heart failure with reduced ejection fraction) (HCC) 07/07/2017   Mitral regurgitation 07/07/2017   (HFpEF) heart failure with preserved ejection fraction (HCC) 07/07/2017   Gout 02/12/2013   Benign hypertension 02/12/2013   GERD (gastroesophageal reflux disease) 02/12/2013    PCP: Caesar Bookman, NP   REFERRING PROVIDER: Ngetich, Donalee Citrin, NP   REFERRING DIAG: unsteady gait, falling episodes   THERAPY DIAG:  Muscle weakness (generalized)  Other abnormalities of gait and mobility  Abnormal posture  Rationale for Evaluation and Treatment: Rehabilitation  ONSET DATE: 06/2022-hospitalization due to back spasms   SUBJECTIVE:   SUBJECTIVE STATEMENT: I've been doing a lot of exercises.  Using Bosu at the gym and I have a foam pad for home.   Eval: PT reports to PT with history of anemia and leukopenia with report of fall while getting off his bike 3 weeks ago. He reports that he was hospitalized due to back spasms in May and spent time time immobilized leading to  getting weak.   PERTINENT HISTORY: CAD, CHF, HTN. Aortic valve replacement, TKA 2021 PAIN:  Are you having pain? No  PRECAUTIONS: Fall  RED FLAGS: None   WEIGHT BEARING RESTRICTIONS: No  FALLS:  Has patient fallen in last 6 months? Yes. Number of falls 1 getting off bike, will address in PT  LIVING ENVIRONMENT: Lives with: lives with their family and lives in an assisted living facility [place] Lives in: House/apartment Stairs: No, has elevator.  Uses steps when able  Has following equipment at home: None  OCCUPATION: retired   PLOF: Independent and Leisure: disc golf, riding bike (not doing this now due to balance) , [employer] 2-3x/wk (weights, TRX)  PATIENT GOALS: improve balance for disc golf, return to cycling without fear of falling,   NEXT MD VISIT: next year  OBJECTIVE:  Note: Objective measures were completed at Evaluation unless otherwise noted.  DIAGNOSTIC FINDINGS: none   COGNITION: Overall cognitive status: Within functional limits for tasks assessed     SENSATION: WFL   MUSCLE LENGTH: 50% limitation in bil hamstrings   POSTURE: rounded shoulders, forward head, and flexed trunk  LOWER EXTREMITY MMT:  MMT Right eval Left eval  Hip flexion 4- 4-  Hip extension 4- 4-  Hip abduction    Hip adduction    Hip internal rotation    Hip external rotation    Knee flexion 4 4  Knee extension 4+ 4+  Ankle dorsiflexion 4+ 4+  Ankle plantarflexion    Ankle inversion    Ankle eversion     (Blank rows = not tested)  FUNCTIONAL TESTS:  11/16/22:  5 times sit to stand: 15.5 seconds  Timed up and go (TUG): 13.13 seconds   11/30/22: 5x sit to stand: 10.93 seconds   12/13/22: TUG: 10.68 seconds    Short Physical Performance Battery: evaluation     Balance:       Side by side stance:    points (1)-1  Semi -tandem:     points (1)-1  Tandem:      points (2)-0    Gait Speed: (78m=9.84 feet) done 2x take the best time:      points                                         4.11- 3 pts     Repeated Chair Stands:      points   (timer stopped when straight on 5th stand)                                              If used arms=0 pts                                            13.87- 3 pts Total score=    8/12  points  Short Physical Performance Battery: 12/15/22    Balance:       Side by side stance:  1  Semi -tandem:  1  Tandem:  2    Gait Speed: (36m=9.84 feet) done 2x take the best time:      points                                          3.43 seconds  4 pts     Repeated Chair Stands:      points   (timer stopped when straight on 5th stand)                                            8.07-4 seconds  Total score=  12/12    points                                                                               GAIT: Distance walked:  50 Assistive device utilized: None Level of assistance: Complete Independence Comments: flexed knees and trunk, shortened step length bilaterally    TODAY'S TREATMENT:                                                                                                                              DATE:  12/15/22 NuStep: Level 6 x 7 minutes -PT present to discuss progress  SPPB test- see above Tandem stance: x20 seconds  Farmer's carry" 10# kettlebell loop around both gyms Rt and Lt  Seated HS stretch 2x30 sec B Lunge to Bosu 2x10 bil Dead lift with 10# KB 2x10 Sit to stand with upright row 10# KB 2x10  12/13/22 NuStep: Level 5x 6 minutes  Leg press: seat 9 bil legs 105# 2x10, Single leg 60#  Seated HS stretch 2x30 sec B Lunge to Bosu 2x10 bil Step up and over bosu at [place] x10 BOSU flat side up x 2 min Dead lift with 10# KB 2x10 Sit to stand with upright row 10# KB 2x10 Heel raises x 20 toes straight - standing on balance pad - cues for pressing through great toes also.  12/02/22 Bike L4 x 5 min Seated HS stretch 2x30 sec B Dynamic HS stretch at counter x 10  Wide legged bent over HS stretch to mat  table BOSU flat side up x 2 min Dead lift with 10# KB 2x10 Sit to stand with upright row 10# KB 2x10 Heel raises x 20 toes straight - standing on balance pad - cues for pressing through great toes also. Resisted walking 10# x 5 reps 4 directions    PATIENT EDUCATION:  Education details: HEP update: [email or web address] Person educated: Patient Education method: Explanation, Demonstration, and Handouts Education comprehension: verbalized understanding and returned demonstration  HOME EXERCISE PROGRAM: Access Code: [email or web address] URL: [email or web address] Date: 12/13/2022 Prepared by: Tresa Endo  Exercises - Seated Hamstring Stretch  - 3 x daily - 7 x weekly - 1 sets - 3 reps - 20 hold - Gastroc Stretch on Wall  - 3 x daily - 7 x weekly - 1 sets - 3 reps - 20 hold - Sit to Stand Without Arm Support  - 2 x daily - 7 x weekly - 1-2 sets - 10 reps - Heel Raises with Counter Support  - 2 x daily - 7 x weekly - 2 sets - 10 reps - Standing Row with Anchored Resistance  - 2 x daily - 7 x weekly - 2 sets - 10 reps - Shoulder extension with resistance - Neutral  - 2 x daily - 7 x weekly - 2 sets - 10 reps - Standing Lumbar Extension at Wall - Forearms  - 2 x daily - 7 x weekly - 1 sets - 10 reps - 5 sec hold - Standing Hip Abduction with Resistance at Ankles and Counter Support  - 1 x daily - 3 x weekly - 3 sets - 10 reps - Heel  Raise  - 1 x daily - 3-4 x weekly - 2-3 sets - 10 reps - Dynamic HS Stretch  - 1-2 x daily - 3-4 x weekly - 1 sets - 10 reps - Standing Feet Apart Firm Surface of Bosu Ball in [employer] With Eyes Open  - 1 x daily - 3-4 x weekly - 1 sets - 10 reps - Lunge Onto BOSU Ball  - 1 x daily - 7 x weekly - 2 sets - 10 reps - Lateral Step Up and Overs on BOSU  - 1 x daily - 7 x weekly - 1 sets - 10 reps - Full Leg Press  - 1 x daily - 7 x weekly - 3 sets - 10 reps  ASSESSMENT:  CLINICAL IMPRESSION: Pt reports that he feels more confident and might get a bike soon. Pt improved score  on short performance physical battery from 8/12 to 12/12 today.  Improved posture overall since the start of care. Pt is using Bosu Ball at [employer]  PT provided close supervision and verbal cues for correct technique with exercise today.  Patient will benefit from skilled PT to address the below impairments and improve overall function.    OBJECTIVE IMPAIRMENTS: Abnormal gait, decreased balance, decreased endurance, difficulty walking, decreased ROM, decreased strength, impaired flexibility, and postural dysfunction.   ACTIVITY LIMITATIONS: carrying, standing, squatting, stairs, transfers, and locomotion level  PARTICIPATION LIMITATIONS: cleaning, laundry, shopping, and community activity  PERSONAL FACTORS: Age, Time since onset of injury/illness/exacerbation, and 1-2 comorbidities: anemia, leukopenia  are also affecting patient's functional outcome.   REHAB POTENTIAL: Good  CLINICAL DECISION MAKING: Stable/uncomplicated  EVALUATION COMPLEXITY: Low   GOALS: Goals reviewed with patient? Yes  SHORT TERM GOALS: Target date: 12/14/2022   Be independent in initial HEP Baseline: Goal status: MET   2.  Perform 5x sit to stand in < or = to 12 seconds to reduce falls risk  Baseline: 10.47 (10/23/2) Goal status: MET  3.  Improve score on SPPB to > or = to 9/12 to reduce mobility disability  Baseline: 12/12 (12/15/22) Goal status: MET  4.  Perform TUG in < or = to 12 seconds  Baseline: 10.68 (12/13/22) Goal status: MET     LONG TERM GOALS: Target date: 01/11/2023    Be independent in advanced HEP Baseline:  Goal status: INITIAL  2.  Improve score on SPPB to > or = to 10/12 to reduce mobility disability Baseline: 12/12 (12/15/22) Goal status: MET  3.  Perform dynamic balance activity with SBA or CGA to improve safety on disc golf course  Baseline:  Goal status: INITIAL  4.  Perform TUG in < or = to 10 seconds to reduce falls risk Baseline: 10.68 (12/13/22) Goal status: In  progress    PLAN:  PT FREQUENCY: 2x/week  PT DURATION: 8 weeks  PLANNED INTERVENTIONS: Therapeutic exercises, Therapeutic activity, Neuromuscular re-education, Balance training, Gait training, Patient/Family education, Self Care, Joint mobilization, Joint manipulation, Stair training, Vestibular training, Canalith repositioning, Aquatic Therapy, Dry Needling, Cryotherapy, Moist heat, Taping, and Manual therapy  PLAN FOR NEXT SESSION: review HEP, gait, balance, strength, endurance progression.  1 more session probable to finalize HEP  Lorrene Reid, PT 12/15/22 11:45 AM

## 2022-12-26 ENCOUNTER — Ambulatory Visit: Payer: Medicare PPO

## 2022-12-26 DIAGNOSIS — R2689 Other abnormalities of gait and mobility: Secondary | ICD-10-CM | POA: Diagnosis not present

## 2022-12-26 DIAGNOSIS — M6281 Muscle weakness (generalized): Secondary | ICD-10-CM | POA: Diagnosis not present

## 2022-12-26 DIAGNOSIS — R293 Abnormal posture: Secondary | ICD-10-CM

## 2022-12-26 NOTE — Therapy (Signed)
OUTPATIENT PHYSICAL THERAPY LOWER EXTREMITY TREATMENT   Patient Name: [name] MRN: [id number] DOB:07-14-1940, 82 y.o., male Today's Date: 12/26/2022  END OF SESSION:  PT End of Session - 12/26/22 0933     Visit Number 7    Authorization Type [employer]-10/9-12/4- 16 visits    Authorization - Visit Number 7    Authorization - Number of Visits 16    PT Start Time 0930    PT Stop Time 1010    PT Time Calculation (min) 40 min    Activity Tolerance Patient tolerated treatment well    Behavior During Therapy WFL for tasks assessed/performed                   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   CHF (congestive heart failure) (HCC)    Elevated cholesterol    Gout    Hepatitis 1964   hepatitis A    HFrEF (heart failure with reduced ejection fraction) (HCC)    Hypertension    Moderate mitral regurgitation    Severe aortic stenosis    Past Surgical History:  Procedure Laterality Date   AORTIC VALVE REPLACEMENT N/A 07/24/2017   Procedure: AORTIC VALVE REPLACEMENT (AVR) using 23mm Inspiris Aortic Valve;  Surgeon: Alleen Borne, MD;  Location: [facility] OR;  Service: Open Heart Surgery;  Laterality: N/A;   CLIPPING OF ATRIAL APPENDAGE N/A 07/24/2017   Procedure: CLIPPING OF ATRIAL APPENDAGE using a 50mm Atricure clip;  Surgeon: Alleen Borne, MD;  Location: [facility] OR;  Service: Open Heart Surgery;  Laterality: N/A;   CORONARY ARTERY BYPASS GRAFT N/A 07/24/2017   Procedure: CORONARY ARTERY BYPASS GRAFTING (CABG) times 3 using the left greater saphenous vein harvested endoscopically and left internal mammary artery.;  Surgeon: Alleen Borne, MD;  Location: [facility] OR;  Service: Open Heart Surgery;  Laterality: N/A;   EYE SURGERY Bilateral    lens replacements for cataracts   HERNIA REPAIR     INGUINAL HERNIA REPAIR Right 05/06/2015   Procedure: LAPAROSCOPIC RIGHT INGUINAL HERNIA WITH MESH;  Surgeon: Abigail Miyamoto,  MD;  Location: [facility] ORS;  Service: General;  Laterality: Right;   INSERTION OF MESH Right 05/06/2015   Procedure: INSERTION OF MESH;  Surgeon: Abigail Miyamoto, MD;  Location: [facility] ORS;  Service: General;  Laterality: Right;   IR BONE MARROW BIOPSY & ASPIRATION  09/15/2022   RIGHT/LEFT HEART CATH AND CORONARY ANGIOGRAPHY N/A 07/10/2017   Procedure: RIGHT/LEFT HEART CATH AND CORONARY ANGIOGRAPHY;  Surgeon: Elder Negus, MD;  Location: [facility] INVASIVE CV LAB;  Service: Cardiovascular;  Laterality: N/A;   TEE WITHOUT CARDIOVERSION N/A 07/11/2017   Procedure: TRANSESOPHAGEAL ECHOCARDIOGRAM (TEE);  Surgeon: Elder Negus, MD;  Location: [facility] ENDOSCOPY;  Service: Cardiovascular;  Laterality: N/A;   TEE WITHOUT CARDIOVERSION N/A 07/24/2017   Procedure: TRANSESOPHAGEAL ECHOCARDIOGRAM (TEE);  Surgeon: Alleen Borne, MD;  Location: [facility] OR;  Service: Open Heart Surgery;  Laterality: N/A;   TEE WITHOUT CARDIOVERSION N/A 08/01/2022   Procedure: TRANSESOPHAGEAL ECHOCARDIOGRAM;  Surgeon: Lewayne Bunting, MD;  Location: [facility] INVASIVE CV LAB;  Service: Cardiovascular;  Laterality: N/A;   TOTAL KNEE ARTHROPLASTY Right 08/07/2019   Procedure: TOTAL KNEE ARTHROPLASTY;  Surgeon: Ollen Gross, MD;  Location: [facility] ORS;  Service: Orthopedics;  Laterality: Right;   ULTRASOUND GUIDANCE FOR VASCULAR ACCESS  07/10/2017   Procedure: Ultrasound Guidance For Vascular Access;  Surgeon: Elder Negus, MD;  Location: [facility] INVASIVE CV LAB;  Service: Cardiovascular;;   Patient Active Problem List   Diagnosis Date Noted   Leukopenia 09/13/2022   PICC (peripherally inserted central catheter) in place 08/30/2022   Pure hypercholesterolemia 08/03/2022   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   Encounter for assessment of peripherally inserted central catheter (PICC) 08/02/2022   Aortic valve endocarditis 08/01/2022   Cardiomyopathy (HCC) 08/01/2022   Chronic HFrEF (heart failure with reduced ejection fraction) (HCC)  08/01/2022   Prosthetic cardiac valve vegetation 08/01/2022   Anemia 08/01/2022   Streptococcal bacteremia 07/29/2022   Aortic atherosclerosis (HCC) 07/29/2022   Medication management 07/29/2022   Bacteremia 07/29/2022   Low back pain 06/24/2022   Constipation 06/24/2022   Renal cyst, 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   PAC (premature atrial contraction) 03/01/2021   PVC (premature ventricular contraction) 03/01/2021   Atrial bigeminy 02/28/2020   OA (osteoarthritis) of knee 08/07/2019   Primary osteoarthritis of right knee 08/07/2019   Coronary artery disease involving coronary bypass graft of native heart without angina pectoris 09/19/2018   Weakness 08/01/2018   Mixed hyperlipidemia 06/06/2018   S/P left atrial appendage ligation 07/25/2017   S/P AVR 07/25/2017   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   Coronary artery disease without angina pectoris 07/12/2017   HFrEF (heart failure with reduced ejection fraction) (HCC) 07/07/2017   Mitral regurgitation 07/07/2017   (HFpEF) heart failure with preserved ejection fraction (HCC) 07/07/2017   Gout 02/12/2013   Benign hypertension 02/12/2013   GERD (gastroesophageal reflux disease) 02/12/2013    PCP: Caesar Bookman, NP   REFERRING PROVIDER: Ngetich, Donalee Citrin, NP   REFERRING DIAG: unsteady gait, falling episodes   THERAPY DIAG:  Muscle weakness (generalized)  Other abnormalities of gait and mobility  Abnormal posture  Rationale for Evaluation and Treatment: Rehabilitation  ONSET DATE: 06/2022-hospitalization due to back spasms   SUBJECTIVE:   SUBJECTIVE STATEMENT: I've ordered a bike and I am ready to start riding again.  I bought a Bosu ball and have been using it at home.    Eval: PT reports to PT with history of anemia and leukopenia with report of fall while getting off his bike 3 weeks ago. He reports that he was hospitalized due to back spasms in May and spent time time immobilized leading to getting weak.   PERTINENT HISTORY: CAD, CHF,  HTN. Aortic valve replacement, TKA 2021 PAIN:  Are you having pain? No  PRECAUTIONS: Fall  RED FLAGS: None   WEIGHT BEARING RESTRICTIONS: No  FALLS:  Has patient fallen in last 6 months? Yes. Number of falls 1 getting off bike, will address in PT  LIVING ENVIRONMENT: Lives with: lives with their family and lives in an assisted living facility [place] Lives in: House/apartment Stairs: No, has elevator.  Uses steps when able  Has following equipment at home: None  OCCUPATION: retired   PLOF: Independent and Leisure: disc golf, riding bike (not doing this now due to balance) , [employer] 2-3x/wk (weights, TRX)  PATIENT GOALS: improve balance for disc golf, return to cycling without fear of falling,   NEXT MD VISIT: next year  OBJECTIVE:  Note: Objective measures were completed at Evaluation unless otherwise noted.  DIAGNOSTIC FINDINGS: none   COGNITION: Overall cognitive status: Within functional limits for tasks assessed     SENSATION: WFL   MUSCLE LENGTH: 50% limitation in bil hamstrings   POSTURE: rounded shoulders, forward head, and flexed trunk   LOWER EXTREMITY MMT:  MMT Right eval Left eval  Hip flexion 4- 4-  Hip extension 4- 4-  Hip abduction    Hip adduction    Hip internal rotation    Hip external rotation    Knee flexion 4 4  Knee extension 4+ 4+  Ankle dorsiflexion 4+ 4+  Ankle plantarflexion    Ankle inversion    Ankle eversion     (Blank rows = not tested)  FUNCTIONAL TESTS:  11/16/22:  5 times sit to stand: 15.5 seconds  Timed up and go (TUG): 13.13 seconds   11/30/22: 5x sit to stand: 10.93 seconds   12/13/22: TUG: 10.68 seconds    Short Physical Performance Battery: evaluation     Balance:       Side by side stance:    points (1)-1  Semi -tandem:     points (1)-1  Tandem:      points (2)-0    Gait Speed: (46m=9.84 feet) done 2x take the best time:      points                                        4.11- 3 pts     Repeated  Chair Stands:      points   (timer stopped when straight on 5th stand)                                              If used arms=0 pts                                            13.87- 3 pts Total score=    8/12  points  Short Physical Performance Battery: 12/15/22    Balance:       Side by side stance:  1  Semi -tandem:  1  Tandem:  2    Gait Speed: (33m=9.84 feet) done 2x take the best time:      points                                          3.43 seconds  4 pts     Repeated Chair Stands:      points   (timer stopped when straight on 5th stand)                                            8.07-4 seconds  Total score=  12/12    points                                                                               GAIT: Distance walked: 50 Assistive device utilized: None Level of assistance: Complete Independence  Comments: flexed knees and trunk, shortened step length bilaterally    TODAY'S TREATMENT:                                                                                                                              DATE:  12/26/22 NuStep: Level 6 x 7 minutes -PT present to discuss progress   Farmer's carry" 10# kettlebell loop around both gyms Rt and Lt  Seated HS stretch 2x30 sec B Walk in reverse: 10# x20 Dead lift with 10# KB 2x10 Sit to stand with upright row 10# KB 2x10 Standing row: 10# 2x20   12/15/22 NuStep: Level 6 x 7 minutes -PT present to discuss progress  SPPB test- see above Tandem stance: x20 seconds  Farmer's carry" 10# kettlebell loop around both gyms Rt and Lt  Seated HS stretch 2x30 sec B Lunge to Bosu 2x10 bil Dead lift with 10# KB 2x10 Sit to stand with upright row 10# KB 2x10  12/13/22 NuStep: Level 5x 6 minutes  Leg press: seat 9 bil legs 105# 2x10, Single leg 60#  Seated HS stretch 2x30 sec B Lunge to Bosu 2x10 bil Step up and over bosu at [place] x10 BOSU flat side up x 2 min Dead lift with 10# KB 2x10 Sit to stand with upright row 10# KB  2x10 Heel raises x 20 toes straight - standing on balance pad - cues for pressing through great toes also.  PATIENT EDUCATION:  Education details: HEP update: [email or web address] Person educated: Patient Education method: Explanation, Demonstration, and Handouts Education comprehension: verbalized understanding and returned demonstration  HOME EXERCISE PROGRAM: Access Code: [email or web address] URL: [email or web address] Date: 12/13/2022 Prepared by: Tresa Endo  Exercises - Seated Hamstring Stretch  - 3 x daily - 7 x weekly - 1 sets - 3 reps - 20 hold - Gastroc Stretch on Wall  - 3 x daily - 7 x weekly - 1 sets - 3 reps - 20 hold - Sit to Stand Without Arm Support  - 2 x daily - 7 x weekly - 1-2 sets - 10 reps - Heel Raises with Counter Support  - 2 x daily - 7 x weekly - 2 sets - 10 reps - Standing Row with Anchored Resistance  - 2 x daily - 7 x weekly - 2 sets - 10 reps - Shoulder extension with resistance - Neutral  - 2 x daily - 7 x weekly - 2 sets - 10 reps - Standing Lumbar Extension at Wall - Forearms  - 2 x daily - 7 x weekly - 1 sets - 10 reps - 5 sec hold - Standing Hip Abduction with Resistance at Ankles and Counter Support  - 1 x daily - 3 x weekly - 3 sets - 10 reps - Heel Raise  - 1 x daily - 3-4 x weekly - 2-3 sets - 10 reps - Dynamic HS Stretch  - 1-2 x daily - 3-4 x weekly - 1 sets -  10 reps - Standing Feet Apart Firm Surface of Bosu Ball in [employer] With Eyes Open  - 1 x daily - 3-4 x weekly - 1 sets - 10 reps - Lunge Onto BOSU Ball  - 1 x daily - 7 x weekly - 2 sets - 10 reps - Lateral Step Up and Overs on BOSU  - 1 x daily - 7 x weekly - 1 sets - 10 reps - Full Leg Press  - 1 x daily - 7 x weekly - 3 sets - 10 reps  ASSESSMENT:  CLINICAL IMPRESSION: Pt is ready to D/C to HEP.  Pt improved score on short performance physical battery from 8/12 to 12/12 last week.  Improved posture overall since the start of care. Pt is using Bosu Ball at home now.  PT provided close supervision  and verbal cues for correct technique with exercise today.  Patient will benefit from skilled PT to address the below impairments and improve overall function.    OBJECTIVE IMPAIRMENTS: Abnormal gait, decreased balance, decreased endurance, difficulty walking, decreased ROM, decreased strength, impaired flexibility, and postural dysfunction.   ACTIVITY LIMITATIONS: carrying, standing, squatting, stairs, transfers, and locomotion level  PARTICIPATION LIMITATIONS: cleaning, laundry, shopping, and community activity  PERSONAL FACTORS: Age, Time since onset of injury/illness/exacerbation, and 1-2 comorbidities: anemia, leukopenia  are also affecting patient's functional outcome.   REHAB POTENTIAL: Good  CLINICAL DECISION MAKING: Stable/uncomplicated  EVALUATION COMPLEXITY: Low   GOALS: Goals reviewed with patient? Yes  SHORT TERM GOALS: Target date: 12/14/2022   Be independent in initial HEP Baseline: Goal status: MET   2.  Perform 5x sit to stand in < or = to 12 seconds to reduce falls risk  Baseline: 10.47 (10/23/2) Goal status: MET  3.  Improve score on SPPB to > or = to 9/12 to reduce mobility disability  Baseline: 12/12 (12/15/22) Goal status: MET  4.  Perform TUG in < or = to 12 seconds  Baseline: 10.68 (12/13/22) Goal status: MET   LONG TERM GOALS: Target date: 01/11/2023    Be independent in advanced HEP Baseline:  Goal status: MET  2.  Improve score on SPPB to > or = to 10/12 to reduce mobility disability Baseline: 12/12 (12/15/22) Goal status: MET  3.  Perform dynamic balance activity with SBA or CGA to improve safety on disc golf course  Baseline:  Goal status: MET  4.  Perform TUG in < or = to 10 seconds to reduce falls risk Baseline: 10.68 (12/13/22) Goal status: In progress    PLAN:  D/C PT to HEP PHYSICAL THERAPY DISCHARGE SUMMARY  Visits from Start of Care: 7  Current functional level related to goals / functional outcomes: See above for  current status.    Remaining deficits: Postural dysfunction- pt is addressing this with exercise and postural corrections.  Balance is improved and pt will continue with HEP for continued gains.    Education / Equipment: HEP, postural education    Patient agrees to discharge. Patient goals were met. Patient is being discharged due to meeting the stated rehab goals.  Lorrene Reid, PT 12/26/22 10:11 AM

## 2022-12-28 ENCOUNTER — Encounter: Payer: Self-pay | Admitting: *Deleted

## 2022-12-28 ENCOUNTER — Ambulatory Visit: Payer: Medicare PPO | Attending: Cardiology | Admitting: Cardiology

## 2022-12-28 ENCOUNTER — Encounter: Payer: Self-pay | Admitting: Cardiology

## 2022-12-28 ENCOUNTER — Ambulatory Visit: Payer: Medicare PPO

## 2022-12-28 ENCOUNTER — Telehealth: Payer: Self-pay | Admitting: *Deleted

## 2022-12-28 VITALS — BP 120/78 | HR 62 | Ht 71.0 in | Wt 171.6 lb

## 2022-12-28 DIAGNOSIS — I25118 Atherosclerotic heart disease of native coronary artery with other forms of angina pectoris: Secondary | ICD-10-CM

## 2022-12-28 DIAGNOSIS — T826XXD Infection and inflammatory reaction due to cardiac valve prosthesis, subsequent encounter: Secondary | ICD-10-CM | POA: Diagnosis not present

## 2022-12-28 DIAGNOSIS — I493 Ventricular premature depolarization: Secondary | ICD-10-CM

## 2022-12-28 DIAGNOSIS — I38 Endocarditis, valve unspecified: Secondary | ICD-10-CM | POA: Diagnosis not present

## 2022-12-28 DIAGNOSIS — I491 Atrial premature depolarization: Secondary | ICD-10-CM | POA: Diagnosis not present

## 2022-12-28 DIAGNOSIS — I5020 Unspecified systolic (congestive) heart failure: Secondary | ICD-10-CM

## 2022-12-28 MED ORDER — ATORVASTATIN CALCIUM 80 MG PO TABS
80.0000 mg | ORAL_TABLET | Freq: Every day | ORAL | 2 refills | Status: DC
Start: 2022-12-28 — End: 2023-11-16

## 2022-12-28 NOTE — Progress Notes (Unsigned)
Enrolled patient for a 14 day Zio XT  monitor to be mailed to patients home  °

## 2022-12-28 NOTE — Progress Notes (Signed)
Cardiology Office Note:  .   Date:  12/28/2022  ID:  [name], DOB 06/05/1940, MRN [id number] PCP: Caesar Bookman, NP  [facility] Providers Cardiologist:  Truett Mainland, MD PCP: Caesar Bookman, NP  Chief Complaint  Patient presents with   Decreased exercise tolerance      History of Present Illness: [name] is a 82 y.o. male with CAD s/p CABG X 3 (LIMA-LAD, SVG-dLCx, SVG-RCA), severer AS treated with  bioprosthetic AVR (23 mm Edwards Inspiris Resilia pericardial valve), LAA clipping in 08/2017, bacteremia and prosthetic valve endocarditis with moderate AS (07/2022), HFrEF now recovered EF to 50% (09/2022), h/o prosthetic valve endocarditis (07/2022) with mod AS, no AI-treated with 6 weeks of IV rocephin, hyponatremia  Ever since his hospitalization in June 2024, his physical activity has gone down.  Potassium was improved since then, but not back to his baseline.  His favorite past time is disc golf.  He has to stop after 9 holes due to exertional dyspnea and lack of energy., which is unusual for him.  He denies any chest pain.    Vitals:   12/28/22 0922  BP: 120/78  Pulse: 62  SpO2: 99%     ROS:  Review of Systems  Constitutional: Positive for malaise/fatigue (Decreased exercise tolerance).  Cardiovascular:  Positive for dyspnea on exertion. Negative for chest pain, leg swelling, palpitations and syncope.     Studies Reviewed: Marland Kitchen        Independently interpreted EKG 12/28/2022: Sinus rhythm with 1st degree A-V block with occasional Premature ventricular complexes and Premature atrial complexes Right bundle branch block When compared with ECG of 13-Sep-2022 09:53,  PACs and PVCs are more frequent    Lipid panel 07/2022: Chol 212, TG 90, HDL 33, LDL 161  EKG 09/23/2022: Sinus rhythm 72 bpm  Frequent PACs First degree AV block Right bundle branch block  Echocardiogram 09/19/2022: Left ventricle cavity is normal in size. Mild  concentric hypertrophy of the left ventricle. Mild global hypokinesis. LVEF around 50%. Doppler evidence of grade II (pseudonormal) diastolic dysfunction, elevated LAP.  Left atrial cavity is severely dilated. Prosthetic23 mm Edwards inspiris resilia pericardial valve. Mobile echodense structure attached on aortic side of the valve, consistent with vegetation. Moderate aortic stenosis. Vmax 2.4 m/sec, mean PG 11 mmHg, AVA 1.2 cm by continuity equation. No aortic valve regurgitation. Structurally normal mitral valve. Moderate (Grade II) mitral regurgitation. Mild to moderate tricuspid regurgitation. No evidence of pulmonary hypertension. Compared to previous studies in [address]. EF has improved from 35-40%. Aortic vegetation remains.    Physical Exam:   Physical Exam Vitals and nursing note reviewed.  Constitutional:      General: He is not in acute distress. Neck:     Vascular: No JVD.  Cardiovascular:     Rate and Rhythm: Normal rate and regular rhythm. Frequent Extrasystoles are present.    Heart sounds: Murmur heard.     Harsh midsystolic murmur is present with a grade of 2/6 at the upper right sternal border radiating to the neck.  Pulmonary:     Effort: Pulmonary effort is normal.     Breath sounds: Normal breath sounds. No wheezing or rales.      VISIT DIAGNOSES:   ICD-10-CM   1. HFrEF (heart failure with reduced ejection fraction) (HCC)  I50.20 EKG 12-Lead    ECHOCARDIOGRAM COMPLETE    MYOCARDIAL PERFUSION IMAGING    2. Coronary artery disease involving native coronary artery of native heart  with other form of angina pectoris (HCC)  I25.118 EKG 12-Lead    ECHOCARDIOGRAM COMPLETE    MYOCARDIAL PERFUSION IMAGING    Cardiac Stress Test: Informed Consent Details: Physician/Practitioner Attestation; Transcribe to consent form and obtain patient signature    3. Prosthetic valve endocarditis, subsequent encounter  T82.6XXD EKG 12-Lead   I38 ECHOCARDIOGRAM COMPLETE    MYOCARDIAL  PERFUSION IMAGING    4. PAC (premature atrial contraction)  I49.1 EKG 12-Lead    LONG TERM MONITOR (3-14 DAYS)    5. PVC (premature ventricular contraction)  I49.3 LONG TERM MONITOR (3-14 DAYS)       ASSESSMENT AND PLAN: .    [name] is a 82 y.o. male with CAD s/p CABG X 3 (LIMA-LAD, SVG-dLCx, SVG-RCA), severer AS treated with  bioprosthetic AVR (23 mm Edwards Inspiris Resilia pericardial valve), LAA clipping in 08/2017, bacteremia and prosthetic valve endocarditis with moderate AS (07/2022), HFrEF with now recovered EF to 50% (09/2022), h/o prosthetic valve endocarditis (07/2022) with mod AS, no AI-treated with 6 weeks of IV rocephin, hyponatremia  Decreased exercise tolerance: Is never regained his exercise capacity ever since his prosthetic endocarditis diagnosis in June 2024.  He tells me his symptoms feel similar to prior to him getting CABG and AVR.  EKG today also shows frequent PACs and PVCs. My differential diagnosis for symptoms includes CAD, high PAC/PVC burden, as well as moderate AS and positive endocarditis. Obtain exercise nuclear stress test, to examine her, repeat echocardiogram 03/2023. Continue aspirin, statin, metoprolol. LDL 161 in 07/2022. Increase lipitor to 80 mg daily. Check lipid panel in 03/2023.   Prosthetic valve endocarditis: Vegetation noted on posterior aortic valve, in the setting of Strept sanguinis bacteremia. Cardiothoracic surgery consulted and hospitalization, no indication for surgery. Currently on cefadroxil 500 mg daily lifelong, as per ID recommendations, after leukopenia thought to be secondary to antibiotics use. Persistent vegetation noted on echocardiogram on 09/19/2022, without any worsening aortic stenosis or aortic regurgitation.  EF has improved to about 50% compared to 07/2022. Continue metoprolol succinate, without any other GDMT for heart failure, as it caused significant orthostatic hypotension in this patient. Recommend repeat  echocardiogram in 03/2023.   HFrEF: EF 35-40% with acute heart failure decompensation in 07/2022. EF recovered to 50% (09/2022).Did not tolerate most GDMT due to hypotension/lightheadedness. Patient appears euvolemic at this time. Medical management as above.    Informed Consent   Shared Decision Making/Informed Consent The risks [chest pain, shortness of breath, cardiac arrhythmias, dizziness, blood pressure fluctuations, myocardial infarction, stroke/transient ischemic attack, nausea, vomiting, allergic reaction, radiation exposure, metallic taste sensation and life-threatening complications (estimated to be 1 in 10,000)], benefits (risk stratification, diagnosing coronary artery disease, treatment guidance) and alternatives of a nuclear stress test were discussed in detail with Mr. Heitmann and he agrees to proceed.        F/u in 04/2023  Signed, Elder Negus, MD

## 2022-12-28 NOTE — Patient Instructions (Addendum)
Medication Instructions:   INCREASE YOUR ATORVASTATIN TO 80 MG BY MOUTH DAILY  *If you need a refill on your cardiac medications before your next appointment, please call your pharmacy*   LABS:  IN 3 MONTHS HERE IN THE OFFICE--LIPIDS-PLEASE COME FASTING TO THIS LAB APPOINTMENT   Testing/Procedures:  Your physician has requested that you have an echocardiogram. Echocardiography is a painless test that uses sound waves to create images of your heart. It provides your doctor with information about the size and shape of your heart and how well your heart's chambers and valves are working. This procedure takes approximately one hour. There are no restrictions for this procedure. SCHEDULE ECHO TO BE DONE IN FEB 2025 PER DR. PATWARDHAN   Please do NOT wear cologne, perfume, aftershave, or lotions (deodorant is allowed). Please arrive 15 minutes prior to your appointment time.  Please note: We ask at that you not bring children with you during ultrasound (echo/ vascular) testing. Due to room size and safety concerns, children are not allowed in the ultrasound rooms during exams. Our front office staff cannot provide observation of children in our lobby area while testing is being conducted. An adult accompanying a patient to their appointment will only be allowed in the ultrasound room at the discretion of the ultrasound technician under special circumstances. We apologize for any inconvenience.    Your physician has requested that you have en exercise stress myoview. For further information please visit [email or web address] Please follow instruction sheet, as given.    ZIO XT- Long Term Monitor Instructions  Your physician has requested you wear a ZIO patch monitor for 14 days.  This is a single patch monitor. [employer] supplies one patch monitor per enrollment. Additional stickers are not available. Please do not apply patch if you will be having a Nuclear Stress Test,  Echocardiogram,  Cardiac CT, MRI, or Chest Xray during the period you would be wearing the  monitor. The patch cannot be worn during these tests. You cannot remove and re-apply the  ZIO XT patch monitor.  Your ZIO patch monitor will be mailed 3 day USPS to your address on file. It may take 3-5 days  to receive your monitor after you have been enrolled.  Once you have received your monitor, please review the enclosed instructions. Your monitor  has already been registered assigning a specific monitor serial # to you.  Billing and Patient Assistance Program Information  We have supplied [employer] with any of your insurance information on file for billing purposes. [employer] offers a sliding scale Patient Assistance Program for patients that do not have  insurance, or whose insurance does not completely cover the cost of the ZIO monitor.  You must apply for the Patient Assistance Program to qualify for this discounted rate.  To apply, please call [employer] at [phone], select option 4, select option 2, ask to apply for  Patient Assistance Program. Meredeth Ide will ask your household income, and how many people  are in your household. They will quote your out-of-pocket cost based on that information.  [employer] will also be able to set up a 10-month, interest-free payment plan if needed.  Applying the monitor   Shave hair from upper left chest.  Hold abrader disc by orange tab. Rub abrader in 40 strokes over the upper left chest as  indicated in your monitor instructions.  Clean area with 4 enclosed alcohol pads. Let dry.  Apply patch as indicated in monitor instructions. Patch will be placed under collarbone on  left  side of chest with arrow pointing upward.  Rub patch adhesive wings for 2 minutes. Remove white label marked "1". Remove the white  label marked "2". Rub patch adhesive wings for 2 additional minutes.  While looking in a mirror, press and release button in center of patch. A small green light will   flash 3-4 times. This will be your only indicator that the monitor has been turned on.  Do not shower for the first 24 hours. You may shower after the first 24 hours.  Press the button if you feel a symptom. You will hear a small click. Record Date, Time and  Symptom in the Patient Logbook.  When you are ready to remove the patch, follow instructions on the last 2 pages of Patient  Logbook. Stick patch monitor onto the last page of Patient Logbook.  Place Patient Logbook in the blue and white box. Use locking tab on box and tape box closed  securely. The blue and white box has prepaid postage on it. Please place it in the mailbox as  soon as possible. Your physician should have your test results approximately 7 days after the  monitor has been mailed back to [facility].  Call [facility] Customer Care at [phone] if you have questions regarding  your ZIO XT patch monitor. Call them immediately if you see an orange light blinking on your  monitor.  If your monitor falls off in less than 4 days, contact our Monitor department at [phone].  If your monitor becomes loose or falls off after 4 days call [employer] at [phone] for  suggestions on securing your monitor   Follow-Up:  IN MARCH 2025 WITH DR. PATWARDHAN

## 2022-12-28 NOTE — Telephone Encounter (Signed)
-----   Message from Flavia Shipper sent at 12/28/2022 10:24 AM EST ----- Regarding: RE: 14 DAY ZIO PER PATWARDHAN Done ----- Message ----- From: Loa Socks, LPN Sent: 16/11/9602  10:05 AM EST To: Ernst Bowler; Katrina Claria Dice Subject: 14 DAY ZIO PER PATWARDHAN                      14 day zio ordered for PACs and PVCs  Please enroll and let me know when you do?   Thanks [employer]

## 2023-01-06 ENCOUNTER — Encounter: Payer: Self-pay | Admitting: Cardiology

## 2023-01-09 ENCOUNTER — Telehealth (HOSPITAL_COMMUNITY): Payer: Self-pay

## 2023-01-09 NOTE — Telephone Encounter (Signed)
 Spoke with the patient, detailed instructions given. Asked to call back with any questions. S.[name] CCT

## 2023-01-10 DIAGNOSIS — I493 Ventricular premature depolarization: Secondary | ICD-10-CM | POA: Diagnosis not present

## 2023-01-10 DIAGNOSIS — I38 Endocarditis, valve unspecified: Secondary | ICD-10-CM | POA: Diagnosis not present

## 2023-01-10 DIAGNOSIS — T826XXD Infection and inflammatory reaction due to cardiac valve prosthesis, subsequent encounter: Secondary | ICD-10-CM | POA: Diagnosis not present

## 2023-01-10 DIAGNOSIS — I491 Atrial premature depolarization: Secondary | ICD-10-CM | POA: Diagnosis not present

## 2023-01-10 DIAGNOSIS — I5020 Unspecified systolic (congestive) heart failure: Secondary | ICD-10-CM | POA: Diagnosis not present

## 2023-01-10 DIAGNOSIS — I25118 Atherosclerotic heart disease of native coronary artery with other forms of angina pectoris: Secondary | ICD-10-CM | POA: Diagnosis not present

## 2023-01-11 LAB — LIPID PANEL
Chol/HDL Ratio: 2.5 {ratio} (ref 0.0–5.0)
Cholesterol, Total: 143 mg/dL (ref 100–199)
HDL: 58 mg/dL (ref >39)
LDL Chol Calc (NIH): 71 mg/dL (ref 0–99)
Triglycerides: 73 mg/dL (ref 0–149)
VLDL Cholesterol Cal: 14 mg/dL (ref 5–40)

## 2023-01-11 NOTE — Telephone Encounter (Signed)
Patient is requesting new medication /Viagra

## 2023-01-16 ENCOUNTER — Ambulatory Visit (HOSPITAL_COMMUNITY): Payer: Medicare PPO | Attending: Cardiology

## 2023-01-16 DIAGNOSIS — I38 Endocarditis, valve unspecified: Secondary | ICD-10-CM | POA: Diagnosis not present

## 2023-01-16 DIAGNOSIS — I5020 Unspecified systolic (congestive) heart failure: Secondary | ICD-10-CM | POA: Insufficient documentation

## 2023-01-16 DIAGNOSIS — I25118 Atherosclerotic heart disease of native coronary artery with other forms of angina pectoris: Secondary | ICD-10-CM | POA: Diagnosis not present

## 2023-01-16 DIAGNOSIS — T826XXD Infection and inflammatory reaction due to cardiac valve prosthesis, subsequent encounter: Secondary | ICD-10-CM | POA: Insufficient documentation

## 2023-01-16 LAB — MYOCARDIAL PERFUSION IMAGING
Angina Index: 0
Duke Treadmill Score: 6
Estimated workload: 7
Exercise duration (min): 6 min
LV dias vol: 158 mL (ref 62–150)
LV sys vol: 84 mL
MPHR: 139 {beats}/min
Nuc Stress EF: 47 %
Peak HR: 122 {beats}/min
Percent HR: 87 %
RPE: 19
Rest HR: 61 {beats}/min
Rest Nuclear Isotope Dose: 10.7 mCi
SDS: 0
SRS: 2
SSS: 2
ST Depression (mm): 0 mm
Stress Nuclear Isotope Dose: 32.7 mCi
TID: 0.93

## 2023-01-16 MED ORDER — TECHNETIUM TC 99M TETROFOSMIN IV KIT
10.7000 | PACK | Freq: Once | INTRAVENOUS | Status: AC | PRN
  Administered 2023-01-16: 10.7 via INTRAVENOUS

## 2023-01-16 MED ORDER — TECHNETIUM TC 99M TETROFOSMIN IV KIT
32.7000 | PACK | Freq: Once | INTRAVENOUS | Status: AC | PRN
  Administered 2023-01-16: 32.7 via INTRAVENOUS

## 2023-01-17 ENCOUNTER — Telehealth: Payer: Self-pay | Admitting: *Deleted

## 2023-01-17 ENCOUNTER — Encounter: Payer: Self-pay | Admitting: Cardiology

## 2023-01-17 MED ORDER — SILDENAFIL CITRATE 25 MG PO TABS: 25.0000 mg | ORAL_TABLET | Freq: Every day | ORAL | 1 refills | Status: DC | PRN

## 2023-01-17 NOTE — Telephone Encounter (Signed)
-----   Message from [facility] sent at 01/17/2023  5:22 PM EST ----- Evidence of prior heart injury without any areas with ischemia (at risk due to heart artery narrowing). Please caution him regarding avoiding concurrent use of nitroglycerin and sildenafil.  PCP's usually defer it to [place] given warning re: concurrent nitroglycerin use.  Okay to prescribe use lowest dose sildenafil, avoid any concurrent use of nitroglycerin medications (currently not on any).  Thanks MJP

## 2023-01-17 NOTE — Telephone Encounter (Signed)
The patient has been notified of the result and verbalized understanding.  All questions (if any) were answered.  Pt aware that per Dr. Rosemary Holms we will call him in sildenafil 25 mg po daily as needed for erectile dysfunction, to his pharmacy of choice.  Pt education provided to avoid concurrent use of nitroglycerin and sildenafil.  Confirmed the pharmacy of choice with the pt.   Sildenafil 25 mg po daily as needed for ED sent to his pharmacy for 10 tablets with 1 refill.   Pt verbalized understanding and agrees with this plan.

## 2023-01-17 NOTE — Telephone Encounter (Signed)
Patwardhan, Anabel Bene, MD  Loa Socks, LPN Evidence of prior heart injury without any areas with ischemia (at risk due to heart artery narrowing). Please caution him regarding avoiding concurrent use of nitroglycerin and sildenafil. PCP's usually defer it to [place] given warning re: concurrent nitroglycerin use. Okay to prescribe use lowest dose sildenafil, avoid any concurrent use of nitroglycerin medications (currently not on any).  Thanks MJP    The patient has been notified of the result and verbalized understanding.  All questions (if any) were answered.  Pt aware that per Dr. Rosemary Holms we will call him in sildenafil 25 mg po daily as needed for erectile dysfunction, to his pharmacy of choice.  Pt education provided to avoid concurrent use of nitroglycerin and sildenafil.  Confirmed the pharmacy of choice with the pt.   Sildenafil 25 mg po daily as needed for ED sent to his pharmacy for 10 tablets with 1 refill.   Pt verbalized understanding and agrees with this plan.

## 2023-01-17 NOTE — Progress Notes (Signed)
Evidence of prior heart injury without any areas with ischemia (at risk due to heart artery narrowing). Please caution him regarding avoiding concurrent use of nitroglycerin and sildenafil.  PCP's usually defer it to [place] given warning re: concurrent nitroglycerin use.  Okay to prescribe use lowest dose sildenafil, avoid any concurrent use of nitroglycerin medications (currently not on any).  Thanks MJP

## 2023-01-20 ENCOUNTER — Encounter: Payer: Self-pay | Admitting: Cardiology

## 2023-01-20 NOTE — Telephone Encounter (Signed)
Message routed to PCP Ngetich, Dinah C, NP  

## 2023-01-23 NOTE — Telephone Encounter (Signed)
Message routed to front administration to schedule patient for appointment.

## 2023-01-24 NOTE — Telephone Encounter (Signed)
Spoke with patient and scheduled video visit for tomorrow

## 2023-01-25 ENCOUNTER — Telehealth (INDEPENDENT_AMBULATORY_CARE_PROVIDER_SITE_OTHER): Payer: Medicare PPO | Admitting: Family

## 2023-01-25 ENCOUNTER — Encounter: Payer: Self-pay | Admitting: Family

## 2023-01-25 ENCOUNTER — Ambulatory Visit (INDEPENDENT_AMBULATORY_CARE_PROVIDER_SITE_OTHER): Payer: Medicare PPO | Admitting: Internal Medicine

## 2023-01-25 ENCOUNTER — Encounter: Payer: Self-pay | Admitting: Internal Medicine

## 2023-01-25 ENCOUNTER — Other Ambulatory Visit: Payer: Self-pay

## 2023-01-25 VITALS — BP 151/71 | HR 59 | Temp 97.8°F | Wt 172.0 lb

## 2023-01-25 DIAGNOSIS — T826XXD Infection and inflammatory reaction due to cardiac valve prosthesis, subsequent encounter: Secondary | ICD-10-CM

## 2023-01-25 DIAGNOSIS — N529 Male erectile dysfunction, unspecified: Secondary | ICD-10-CM

## 2023-01-25 DIAGNOSIS — I38 Endocarditis, valve unspecified: Secondary | ICD-10-CM | POA: Diagnosis not present

## 2023-01-25 MED ORDER — CEFADROXIL 500 MG PO CAPS: 500.0000 mg | ORAL_CAPSULE | Freq: Two times a day (BID) | ORAL | 11 refills | Status: DC

## 2023-01-25 NOTE — Assessment & Plan Note (Signed)
He continues to do well on the cefadroxil and no changes.  Plan to continue chronically pending any valve surgery Will check his labs today and refills provided. Follow up in 6 months.   I have personally spent 30 minutes involved in face-to-face and non-face-to-face activities for this patient on the day of the visit. Professional time spent includes the following activities: Preparing to see the patient (review of tests), Obtaining and/or reviewing separately obtained history (admission/discharge record), Performing a medically appropriate examination and/or evaluation , Ordering medications/tests/procedures, referring and communicating with other health care professionals, Documenting clinical information in the EMR, Independently interpreting results (not separately reported), Communicating results to the patient/family/caregiver, Counseling and educating the patient/family/caregiver and Care coordination (not separately reported).

## 2023-01-25 NOTE — Progress Notes (Signed)
This service is provided via telemedicine  No vital signs collected/recorded due to the encounter was a telemedicine visit.   Location of patient (ex: home, work):  Home  Patient consents to a telephone visit: Yes  Location of the provider (ex: office, home):  [facility], Office   Name of any referring provider:  N/A  Names of all persons participating in the telemedicine service and their role in the encounter:  [name], CMA, Patient, and Iniko Robles, Donalee Citrin, NP   Time spent on call:  9 min with medical assistant      Provider: Dayleen Beske FNP-C  Melven Stockard, Donalee Citrin, NP  Patient Care Team: Igor Bishop, Donalee Citrin, NP as PCP - General (Family Medicine) Cardiovascular, [facility]  Extended Emergency Contact Information Primary Emergency Contact: Austell,Johnny Address: [address]          [place], [place] [address] Darden Amber of [place] Home Phone: [phone] Mobile Phone: [phone] Relation: Son Secondary Emergency Contact: cole,sue Address: [address]          [place], [place] [address] [place] of [place] Mobile Phone: [phone] Relation: Friend  Code Status:  Full Code  Goals of care: Advanced Directive information    01/25/2023    2:55 PM  Advanced Directives  Does Patient Have a Medical Advance Directive? Yes  Type of Estate agent of [place];Living will  Does patient want to make changes to medical advance directive? No - Patient declined  Copy of Healthcare Power of Attorney in Chart? Yes - validated most recent copy scanned in chart (See row information)     Chief Complaint  Patient presents with   Medical Management of Chronic Issues    Discuss medication.    HPI:  Pt is a 82 y.o. male seen today for an acute visit to discuss medication.states has found a new love at 76 yrs old and girl friend is 74 yrs.He was approved for sildenafil 25 mg tablet daily PRN but states not effective.Has  tried taking 50 mg tablet without any difference. States currently has Zio heart monitor to wear for 2 weeks.states told Cardiologist would like to get off antibiotics then consider Heart valve replacement. States has x 1 episode of dizziness today while walking across the street but then symptoms went away. He denies any,cough,fatigue,body aches,runny nose,chest tightness,chest pain,palpitation or shortness of breath.     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   CHF (congestive heart failure) (HCC)    Elevated cholesterol    Gout    Hepatitis 1964   hepatitis A    HFrEF (heart failure with reduced ejection fraction) (HCC)    Hypertension    Moderate mitral regurgitation    Severe aortic stenosis    Past Surgical History:  Procedure Laterality Date   AORTIC VALVE REPLACEMENT N/A 07/24/2017   Procedure: AORTIC VALVE REPLACEMENT (AVR) using 23mm Inspiris Aortic Valve;  Surgeon: Alleen Borne, MD;  Location: [facility] OR;  Service: Open Heart Surgery;  Laterality: N/A;   CLIPPING OF ATRIAL APPENDAGE N/A 07/24/2017   Procedure: CLIPPING OF ATRIAL APPENDAGE using a 50mm Atricure clip;  Surgeon: Alleen Borne, MD;  Location: [facility] OR;  Service: Open Heart Surgery;  Laterality: N/A;   CORONARY ARTERY BYPASS GRAFT N/A 07/24/2017   Procedure: CORONARY ARTERY BYPASS GRAFTING (CABG) times 3 using the left greater saphenous vein harvested endoscopically and  left internal mammary artery.;  Surgeon: Alleen Borne, MD;  Location: [facility] OR;  Service: Open Heart Surgery;  Laterality: N/A;   EYE SURGERY Bilateral    lens replacements for cataracts   HERNIA REPAIR     INGUINAL HERNIA REPAIR Right 05/06/2015   Procedure: LAPAROSCOPIC RIGHT INGUINAL HERNIA WITH MESH;  Surgeon: Abigail Miyamoto, MD;  Location: [facility] ORS;  Service: General;  Laterality: Right;   INSERTION OF MESH Right 05/06/2015   Procedure: INSERTION OF MESH;  Surgeon: Abigail Miyamoto, MD;   Location: [facility] ORS;  Service: General;  Laterality: Right;   IR BONE MARROW BIOPSY & ASPIRATION  09/15/2022   RIGHT/LEFT HEART CATH AND CORONARY ANGIOGRAPHY N/A 07/10/2017   Procedure: RIGHT/LEFT HEART CATH AND CORONARY ANGIOGRAPHY;  Surgeon: Elder Negus, MD;  Location: [facility] INVASIVE CV LAB;  Service: Cardiovascular;  Laterality: N/A;   TEE WITHOUT CARDIOVERSION N/A 07/11/2017   Procedure: TRANSESOPHAGEAL ECHOCARDIOGRAM (TEE);  Surgeon: Elder Negus, MD;  Location[facility] ENDOSCOPY;  Service: Cardiovascular;  Laterality: N/A;   TEE WITHOUT CARDIOVERSION N/A 07/24/2017   Procedure: TRANSESOPHAGEAL ECHOCARDIOGRAM (TEE);  Surgeon: Alleen Borne, MD;  Location: [facility] OR;  Service: Open Heart Surgery;  Laterality: N/A;   TEE WITHOUT CARDIOVERSION N/A 08/01/2022   Procedure: TRANSESOPHAGEAL ECHOCARDIOGRAM;  Surgeon: Lewayne Bunting, MD;  Location: [facility] INVASIVE CV LAB;  Service: Cardiovascular;  Laterality: N/A;   TOTAL KNEE ARTHROPLASTY Right 08/07/2019   Procedure: TOTAL KNEE ARTHROPLASTY;  Surgeon: Ollen Gross, MD;  Location: [facility] ORS;  Service: Orthopedics;  Laterality: Right;   ULTRASOUND GUIDANCE FOR VASCULAR ACCESS  07/10/2017   Procedure: Ultrasound Guidance For Vascular Access;  Surgeon: Elder Negus, MD;  Location: [facility] INVASIVE CV LAB;  Service: Cardiovascular;;    Allergies  Allergen Reactions   Sulfamethoxazole-Trimethoprim Rash    Outpatient Encounter Medications as of 01/25/2023  Medication Sig   acetaminophen (TYLENOL) 500 MG tablet Take 2 tablets (1,000 mg total) by mouth every 6 (six) hours as needed for mild pain or fever.   ascorbic acid (VITAMIN C) 500 MG tablet TAKE 1 TABLET (500 MG TOTAL) BY MOUTH DAILY.   aspirin EC 81 MG tablet Take 81 mg by mouth daily. Swallow whole.   atorvastatin (LIPITOR) 80 MG tablet Take 1 tablet (80 mg total) by mouth daily.   cefadroxil (DURICEF) 500 MG capsule Take 1 capsule (500 mg total) by mouth 2 (two) times daily.   ferrous gluconate  (FERGON) 324 MG tablet TAKE 1 TABLET BY MOUTH EVERY DAY WITH BREAKFAST   metoprolol succinate (TOPROL-XL) 25 MG 24 hr tablet Take 0.5 tablets (12.5 mg total) by mouth daily. Take with or immediately following a meal. (Patient taking differently: Take 25 mg by mouth daily. Take with or immediately following a meal.)   Multiple Vitamin (MULTIVITAMIN ADULT PO) Take 1 tablet by mouth daily.   MYRBETRIQ 25 MG TB24 tablet Take 25 mg by mouth daily.   Omega-3 Fatty Acids (FISH OIL) 1000 MG CAPS Take 2 capsules by mouth daily.   tamsulosin (FLOMAX) 0.4 MG CAPS capsule TAKE 1 CAPSULE BY MOUTH EVERY DAY   torsemide (DEMADEX) 10 MG tablet TAKE 1 TABLET BY MOUTH TWICE A DAY   cetirizine (ZYRTEC) 10 MG tablet Take 10 mg by mouth daily. (Patient not taking: Reported on 01/25/2023)   sildenafil (VIAGRA) 25 MG tablet Take 1 tablet (25 mg total) by mouth daily as needed for erectile dysfunction. (Patient not taking: Reported on 01/25/2023)   No facility-administered encounter medications on file  as of 01/25/2023.    Review of Systems  Constitutional:  Negative for appetite change, chills, fatigue, fever and unexpected weight change.  HENT:  Negative for congestion, hearing loss, nosebleeds, postnasal drip, rhinorrhea, sinus pressure, sinus pain, sneezing and sore throat.   Eyes:  Negative for pain, discharge, redness, itching and visual disturbance.  Respiratory:  Negative for cough, chest tightness, shortness of breath and wheezing.   Cardiovascular:  Negative for chest pain, palpitations and leg swelling.  Gastrointestinal:  Negative for abdominal distention, abdominal pain, nausea and vomiting.  Genitourinary:  Negative for difficulty urinating, dysuria, flank pain, frequency and urgency.       Erectile dysfunction   Skin:  Negative for color change, pallor and rash.  Neurological:  Negative for dizziness, weakness, light-headedness, numbness and headaches.    Immunization History  Administered Date(s)  Administered   Fluad Quad(high Dose 65+) 01/18/2022   Influenza, High Dose Seasonal PF 11/24/2018, 10/19/2022   Influenza-Unspecified 11/24/2018   Moderna Covid-19 Fall Seasonal Vaccine 3yrs & older 10/19/2022   Moderna Sars-Covid-2 Vaccination 03/15/2019, 03/15/2019, 04/12/2019, 04/12/2019   PNEUMOCOCCAL CONJUGATE-20 01/18/2022   Tdap 02/11/2013, 01/18/2019   Zoster Recombinant(Shingrix) 02/26/2019, 05/20/2019   Pertinent  Health Maintenance Due  Topic Date Due   INFLUENZA VACCINE  Completed      10/26/2022   10:06 AM 10/31/2022   10:03 AM 11/15/2022    1:58 PM 01/25/2023   10:23 AM 01/25/2023    2:53 PM  Fall Risk  Falls in the past year? 0 1 0 0 1  Was there an injury with Fall?  1   0  Fall Risk Category Calculator  2   1  Patient at Risk for Falls Due to No Fall Risks   No Fall Risks   Fall risk Follow up Falls evaluation completed   Falls evaluation completed    Functional Status Survey:    There were no vitals filed for this visit. There is no height or weight on file to calculate BMI. Physical Exam Constitutional:      General: He is not in acute distress.    Appearance: He is not ill-appearing.  Pulmonary:     Effort: Pulmonary effort is normal. No respiratory distress.  Neurological:     Mental Status: He is alert and oriented to person, place, and time.     Gait: Gait normal.  Psychiatric:        Mood and Affect: Mood normal.        Behavior: Behavior normal.     Labs reviewed: Recent Labs    07/19/22 1656 07/28/22 0209 07/30/22 0915 08/02/22 0931 09/13/22 1153 09/14/22 0515 09/16/22 0629 09/17/22 0631 09/21/22 1019  NA 133*   < > 131*   < > 129*   < > 132* 132* 134*  K 5.1   < > 3.8   < > 3.4*   < > 3.9 3.7 4.6  CL 97*   < > 94*   < > 94*   < > 94* 96* 97*  CO2 28   < > 28   < > 25   < > 29 25 28   GLUCOSE 114   < > 113*   < > 112*   < > 109* 97 93  BUN 24   < > 27*   < > 14   < > 10 11 11   CREATININE 1.04   < > 1.01   < > 0.82   < > 0.85  0.82  0.99  CALCIUM 8.7   < > 8.7*   < > 8.4*   < > 8.9 8.7* 9.8  MG 2.0  --  1.9  --  1.9  --   --   --   --   PHOS  --   --  2.9  --   --   --   --   --   --    < > = values in this interval not displayed.   Recent Labs    09/15/22 0654 09/16/22 0629 09/17/22 0631 09/21/22 1019  AST 73* 43* 22 14  ALT 74* 62* 47* 24  ALKPHOS 42 46 48  --   BILITOT 0.7 0.4 0.6 0.3  PROT 5.4* 5.6* 5.7* 7.2  ALBUMIN 2.5* 2.5* 2.6*  --    Recent Labs    09/28/22 1312 10/26/22 1021 11/10/22 1040  WBC 7.5 5.8 5.3  NEUTROABS 6.3 4,420 3.6  HGB 9.5* 10.6* 11.5*  HCT 29.1* 33.1* 35.0*  MCV 92.7 95.9 94.3  PLT 225 181 171   Lab Results  Component Value Date   TSH 1.124 09/13/2022   Lab Results  Component Value Date   HGBA1C 5.7 (H) 07/20/2017   Lab Results  Component Value Date   CHOL 143 01/10/2023   HDL 58 01/10/2023   LDLCALC 71 01/10/2023   LDLDIRECT 155 (H) 08/03/2022   TRIG 73 01/10/2023   CHOLHDL 2.5 01/10/2023    Significant Diagnostic Results in last 30 days:  MYOCARDIAL PERFUSION IMAGING Result Date: 01/16/2023   Findings are consistent with infarction. The study is intermediate risk.   Exercise capacity was normal. Patient exercised for 6 min. Maximum HR of 122 bpm. MPHR 87.0%. Peak METS 7.0.   No ST deviation was noted.   LV perfusion is abnormal. There is no evidence of ischemia. There is evidence of infarction. Defect 1: There is a medium defect with moderate reduction in uptake present in the apical to basal inferior and inferolateral location(s) that is partially reversible. There is abnormal wall motion in the defect area. Consistent with infarction.   Left ventricular function is abnormal. Global function is mildly reduced. Nuclear stress EF: 47%. The left ventricular ejection fraction is mildly decreased (45-54%). End diastolic cavity size is moderately enlarged. End systolic cavity size is mildly enlarged.   Prior study not available for comparison. Perfusion defect that  improves slightly with stress in inferior and inferolateral wall. Mildly reduced LVEF with wall motion abnormalities in inferior and inferoseptal walls. No evidence of ischemia.    Assessment/Plan  Erectile dysfunction, unspecified erectile dysfunction type (Primary) - currently on Sildenafil  25 mg tablet but has taken up to 50 mg tablet without any results.Has significant medical history of CAD,HTN and prosthetic valve endocarditis.discussed with patient option for referral to urology might benefit from other alternatives to medication.  - Ambulatory referral to Urology   Family/ staff Communication: Reviewed plan of care with patient verbalized understanding   Labs/tests ordered: None   Next Appointment: Return if symptoms worsen or fail to improve.  I connected with  [name] on 01/25/23 by a video enabled telemedicine application and verified that I am speaking with the correct person using two identifiers.   I discussed the limitations of evaluation and management by telemedicine. The patient expressed understanding and agreed to proceed.  Spent 11 minutes of face to face with patient  >50% time spent counseling; reviewing medical record; tests; labs; documenting and developing future plan of care.  Caesar Bookman, NP

## 2023-01-25 NOTE — Progress Notes (Signed)
   Subjective:    Patient ID: Eesa Tyska, male    DOB: 1940-09-10, 82 y.o.   MRN: [id number]  HPI Mr. Taniguchi is here for follow up for prosthetic valve endocarditis.  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  No surgery done and treated with prolonged IV antibiotics followed by oral antibiotics with amoxicillin.  He was admitted again in August with weakness and found to be leukopenic and anemic, though to be due to amoxicillin and he was changed to cefadroxil.  He has done well on cefadroxil and no issues with rash or diarrhea.  Doesn't miss his medication.  He is undergoing evaluation by his cardiologist for consideration of valve replacement.     Review of Systems  Constitutional:  Negative for fever.  Gastrointestinal:  Negative for diarrhea.  Skin:  Negative for rash.       Objective:   Physical Exam Eyes:     General: No scleral icterus. Pulmonary:     Effort: Pulmonary effort is normal.  Neurological:     Mental Status: He is alert.           Assessment & Plan:

## 2023-01-26 LAB — CBC WITH DIFFERENTIAL/PLATELET
Absolute Lymphocytes: 655 {cells}/uL — ABNORMAL LOW (ref 850–3900)
Absolute Monocytes: 454 {cells}/uL (ref 200–950)
Basophils Absolute: 38 {cells}/uL (ref 0–200)
Basophils Relative: 0.9 %
Eosinophils Absolute: 80 {cells}/uL (ref 15–500)
Eosinophils Relative: 1.9 %
HCT: 36.1 % — ABNORMAL LOW (ref 38.5–50.0)
Hemoglobin: 11.8 g/dL — ABNORMAL LOW (ref 13.2–17.1)
MCH: 32.2 pg (ref 27.0–33.0)
MCHC: 32.7 g/dL (ref 32.0–36.0)
MCV: 98.4 fL (ref 80.0–100.0)
MPV: 10.7 fL (ref 7.5–12.5)
Monocytes Relative: 10.8 %
Neutro Abs: 2974 {cells}/uL (ref 1500–7800)
Neutrophils Relative %: 70.8 %
Platelets: 182 10*3/uL (ref 140–400)
RBC: 3.67 10*6/uL — ABNORMAL LOW (ref 4.20–5.80)
RDW: 12.3 % (ref 11.0–15.0)
Total Lymphocyte: 15.6 %
WBC: 4.2 10*3/uL (ref 3.8–10.8)

## 2023-01-26 LAB — COMPLETE METABOLIC PANEL WITH GFR
AG Ratio: 1.5 (calc) (ref 1.0–2.5)
ALT: 15 U/L (ref 9–46)
AST: 19 U/L (ref 10–35)
Albumin: 4.1 g/dL (ref 3.6–5.1)
Alkaline phosphatase (APISO): 60 U/L (ref 35–144)
BUN: 15 mg/dL (ref 7–25)
CO2: 26 mmol/L (ref 20–32)
Calcium: 9.7 mg/dL (ref 8.6–10.3)
Chloride: 102 mmol/L (ref 98–110)
Creat: 0.98 mg/dL (ref 0.70–1.22)
Globulin: 2.7 g/dL (ref 1.9–3.7)
Glucose, Bld: 90 mg/dL (ref 65–99)
Potassium: 4.5 mmol/L (ref 3.5–5.3)
Sodium: 137 mmol/L (ref 135–146)
Total Bilirubin: 0.7 mg/dL (ref 0.2–1.2)
Total Protein: 6.8 g/dL (ref 6.1–8.1)
eGFR: 77 mL/min/{1.73_m2} (ref >=60)

## 2023-02-04 ENCOUNTER — Other Ambulatory Visit: Payer: Self-pay | Admitting: Family

## 2023-02-08 ENCOUNTER — Encounter: Payer: Self-pay | Admitting: Cardiology

## 2023-02-09 NOTE — Telephone Encounter (Signed)
 Called patient to let him know that the referral was placed awhile ago and offered the phone number to [facility] , and patient stated he already has It.

## 2023-02-13 DIAGNOSIS — R350 Frequency of micturition: Secondary | ICD-10-CM | POA: Diagnosis not present

## 2023-02-13 DIAGNOSIS — N401 Enlarged prostate with lower urinary tract symptoms: Secondary | ICD-10-CM | POA: Diagnosis not present

## 2023-02-13 DIAGNOSIS — R351 Nocturia: Secondary | ICD-10-CM | POA: Diagnosis not present

## 2023-02-16 ENCOUNTER — Encounter: Payer: Self-pay | Admitting: Cardiology

## 2023-02-20 ENCOUNTER — Other Ambulatory Visit: Payer: Self-pay | Admitting: *Deleted

## 2023-02-20 ENCOUNTER — Ambulatory Visit: Payer: Medicare PPO | Attending: Cardiology

## 2023-02-20 ENCOUNTER — Telehealth: Payer: Self-pay | Admitting: *Deleted

## 2023-02-20 DIAGNOSIS — I491 Atrial premature depolarization: Secondary | ICD-10-CM

## 2023-02-20 DIAGNOSIS — I493 Ventricular premature depolarization: Secondary | ICD-10-CM

## 2023-02-20 NOTE — Telephone Encounter (Signed)
 Jesse Brill, LPN Patient called.  He is willing to wear another monitor.  New order placed and processed.

## 2023-02-20 NOTE — Progress Notes (Unsigned)
 Enrolled for [employer] to mail a ZIO XT long term holter monitor to the patients address on file.

## 2023-02-20 NOTE — Telephone Encounter (Signed)
 Explained to patient monitor status of "lost" for his ZIO monitor ordered 12/28/22. Patient willing to wear the monitor again. Original order cancelled and new order entered . Order processed for replacement monitor to be mailed to patient.

## 2023-02-24 DIAGNOSIS — I491 Atrial premature depolarization: Secondary | ICD-10-CM | POA: Diagnosis not present

## 2023-02-24 DIAGNOSIS — I493 Ventricular premature depolarization: Secondary | ICD-10-CM | POA: Diagnosis not present

## 2023-03-02 ENCOUNTER — Encounter: Payer: Self-pay | Admitting: Cardiology

## 2023-03-02 NOTE — Telephone Encounter (Signed)
Please check if your urologist will be able to refill this as per their recommendation.  Thanks MJP

## 2023-03-11 ENCOUNTER — Other Ambulatory Visit: Payer: Self-pay | Admitting: Cardiology

## 2023-03-13 ENCOUNTER — Encounter: Payer: Self-pay | Admitting: Cardiology

## 2023-03-13 ENCOUNTER — Ambulatory Visit (HOSPITAL_COMMUNITY): Payer: Medicare PPO | Attending: Cardiology

## 2023-03-13 DIAGNOSIS — I5020 Unspecified systolic (congestive) heart failure: Secondary | ICD-10-CM | POA: Insufficient documentation

## 2023-03-13 DIAGNOSIS — I38 Endocarditis, valve unspecified: Secondary | ICD-10-CM | POA: Diagnosis not present

## 2023-03-13 DIAGNOSIS — I25118 Atherosclerotic heart disease of native coronary artery with other forms of angina pectoris: Secondary | ICD-10-CM | POA: Insufficient documentation

## 2023-03-13 DIAGNOSIS — T826XXD Infection and inflammatory reaction due to cardiac valve prosthesis, subsequent encounter: Secondary | ICD-10-CM | POA: Diagnosis not present

## 2023-03-13 LAB — ECHOCARDIOGRAM COMPLETE
AR max vel: 1.18 cm2
AV Area VTI: 1.23 cm2
AV Area mean vel: 1.19 cm2
AV Mean grad: 9 mm[Hg]
AV Peak grad: 16.5 mm[Hg]
Ao pk vel: 2.03 m/s
Area-P 1/2: 2.01 cm2
S' Lateral: 3.9 cm

## 2023-03-14 ENCOUNTER — Ambulatory Visit: Payer: Medicare PPO | Admitting: Oncology

## 2023-03-14 ENCOUNTER — Other Ambulatory Visit: Payer: Medicare PPO

## 2023-03-14 MED ORDER — TORSEMIDE 10 MG PO TABS: 10.0000 mg | ORAL_TABLET | Freq: Two times a day (BID) | ORAL | 3 refills | Status: DC

## 2023-03-14 NOTE — Progress Notes (Signed)
Heart function is normal, prosthetic valve looks okay, but new finding of severe pulmonary hypertension.He has appt with me early March. Okay to keep or bring in sooner if any available spot. He will likely need right heart catheterization.  Thanks MJP

## 2023-03-16 ENCOUNTER — Encounter: Payer: Self-pay | Admitting: Cardiology

## 2023-03-17 ENCOUNTER — Ambulatory Visit: Payer: Medicare PPO | Attending: Cardiology | Admitting: Cardiology

## 2023-03-17 ENCOUNTER — Encounter: Payer: Self-pay | Admitting: *Deleted

## 2023-03-17 ENCOUNTER — Other Ambulatory Visit: Payer: Self-pay | Admitting: *Deleted

## 2023-03-17 ENCOUNTER — Encounter: Payer: Self-pay | Admitting: Cardiology

## 2023-03-17 VITALS — BP 106/58 | HR 62 | Ht 71.0 in | Wt 175.6 lb

## 2023-03-17 DIAGNOSIS — I5020 Unspecified systolic (congestive) heart failure: Secondary | ICD-10-CM

## 2023-03-17 DIAGNOSIS — I38 Endocarditis, valve unspecified: Secondary | ICD-10-CM

## 2023-03-17 DIAGNOSIS — I25118 Atherosclerotic heart disease of native coronary artery with other forms of angina pectoris: Secondary | ICD-10-CM | POA: Diagnosis not present

## 2023-03-17 DIAGNOSIS — T826XXD Infection and inflammatory reaction due to cardiac valve prosthesis, subsequent encounter: Secondary | ICD-10-CM

## 2023-03-17 DIAGNOSIS — I272 Pulmonary hypertension, unspecified: Secondary | ICD-10-CM | POA: Diagnosis not present

## 2023-03-17 DIAGNOSIS — E871 Hypo-osmolality and hyponatremia: Secondary | ICD-10-CM

## 2023-03-17 DIAGNOSIS — I10 Essential (primary) hypertension: Secondary | ICD-10-CM

## 2023-03-17 DIAGNOSIS — I491 Atrial premature depolarization: Secondary | ICD-10-CM

## 2023-03-17 DIAGNOSIS — I493 Ventricular premature depolarization: Secondary | ICD-10-CM | POA: Diagnosis not present

## 2023-03-17 NOTE — Progress Notes (Signed)
 Cardiology Office Note:   Date:  03/20/2023  ID:  [name], DOB Apr 29, 1940, MRN [id number] PCP: Leonarda Roxan BROCKS, NP  [facility] Providers Cardiologist:  None    History of Present Illness:   Discussed the use of AI scribe software for clinical note transcription with the patient, who gave verbal consent to proceed.  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  In June 2024, he was hospitalized and found with positive blood cultures along with prosthetic valve endocarditis. He was treated with IV rocephin . Prior to this hospitalization, he was very active, engaging in activities such as disc golf and bicycling without difficulty.  He presents with ongoing issues with decreased exertional tolerance, describing it as similar to symptoms prior to his CABG and aortic valve repair. Since his hospitalization, he has experienced progressive shortness of breath and reduced physical activity tolerance. He can no longer complete a full round of disc golf without stopping due to shortness of breath, whereas previously, his exertional capacity was 'almost unlimited'.  In December 2024, a nuclear stress test indicated an intermediate risk with abnormal left ventricular (LV) perfusion consistent with prior heart injury, but no acute ischemia. An echocardiogram showed a normal left ventricular ejection fraction of 50-55%, a well-seated prosthetic aortic valve without vegetation or stenosis, and a mean gradient of 9 mmHg. However, it also revealed new evidence of pulmonary hypertension with low normal right ventricular function and elevated pulmonary artery systolic pressure.  No chest discomfort,  palpitations, dizziness, or lightheadedness. No swelling in the legs or abdomen. He reports no issues with sleeping and no orthopnea. He mentions erectile dysfunction and notes that sildenafil  has not been very effective.  His current medications include metoprolol , torsemide  (twice daily), and cefadroxil  for ongoing antibiotic prophylaxis due to his history of prosthetic valve endocarditis. He also takes atorvastatin  for cholesterol management.    Studies Reviewed:    EKG:   EKG Interpretation Date/Time:  Friday March 17 2023 13:26:16 EST Ventricular Rate:  62 PR Interval:  226 QRS Duration:  154 QT Interval:  494 QTC Calculation: 501 R Axis:   77  Text Interpretation: Sinus rhythm with marked sinus arrhythmia with 1st degree A-V block Right bundle branch block When compared with ECG of 28-Dec-2022 09:41, No significant change was found Confirmed by Trudy Birmingham [phone]) on 03/17/2023 1:31:13 PM   03/13/23 TTE  IMPRESSIONS     1. Left ventricular ejection fraction, by estimation, is 50 to 55%. The  left ventricle has low normal function. The left ventricle has no regional  wall motion abnormalities. There is mild left ventricular hypertrophy.  Left ventricular diastolic  parameters are indeterminate.   2. Right ventricular systolic function is low normal. The right  ventricular size is mildly enlarged. There is severely elevated pulmonary  artery systolic pressure. The estimated right ventricular systolic  pressure is 74.6 mmHg.   3. Left atrial size was moderately dilated.   4. Right atrial size was severely dilated.   5. The mitral valve is normal in structure. Mild to moderate mitral valve  regurgitation. No evidence of mitral stenosis.   6. Tricuspid valve regurgitation is moderate.   7. S/P 23 mm Edward Inspiris Valve well seated, no evidence of  dehiscence, no valvular regurgitation or stenosis. Peak velocity 2.90m/s,  MG 9 mmHG, DI 0.39, AVA VTI 1.23cm2.   8. Aortic  dilatation noted. There is dilatation of the ascending aorta,  measuring 39 mm.   9. The inferior vena cava is dilated in size with >50% respiratory  variability, suggesting right atrial pressure of 8 mmHg.   FINDINGS   Left Ventricle: Left ventricular ejection fraction, by estimation, is 50  to 55%. The left ventricle has low normal function. The left ventricle has  no regional wall motion abnormalities. The left ventricular internal  cavity size was normal in size.  There is mild left ventricular hypertrophy. Left ventricular diastolic  parameters are indeterminate.   Right Ventricle: The right ventricular size is mildly enlarged. No  increase in right ventricular wall thickness. Right ventricular systolic  function is low normal. There is severely elevated pulmonary artery  systolic pressure. The tricuspid regurgitant  velocity is 4.08 m/s, and with an assumed right atrial pressure of 8 mmHg,  the estimated right ventricular systolic pressure is 74.6 mmHg.   Left Atrium: Left atrial size was moderately dilated.   Right Atrium: Right atrial size was severely dilated.   Pericardium: There is no evidence of pericardial effusion.   Mitral Valve: The mitral valve is normal in structure. Mild to moderate  mitral valve regurgitation. No evidence of mitral valve stenosis.   Tricuspid Valve: The tricuspid valve is normal in structure. Tricuspid  valve regurgitation is moderate . No evidence of tricuspid stenosis.   Aortic Valve: S/P 23 mm Edward Inspiris Valve well seated, no evidence of  dehiscence, no valvular regurgitation or stenosis. Peak velocity 2.69m/s,  MG 9 mmHG, DI 0.39, AVA VTI 1.23cm2. Aortic valve mean gradient measures  9.0 mmHg. Aortic valve peak  gradient measures 16.5 mmHg. Aortic valve area, by VTI measures 1.23 cm.   Pulmonic Valve: The pulmonic valve was normal in structure. Pulmonic valve  regurgitation is mild. No evidence of pulmonic stenosis.   Aorta: The  aortic root is normal in size and structure and aortic  dilatation noted. There is dilatation of the ascending aorta, measuring 39  mm.   Venous: The inferior vena cava is dilated in size with greater than 50%  respiratory variability, suggesting right atrial pressure of 8 mmHg.   IAS/Shunts: The atrial septum is grossly normal.    01/16/23 Myoview  Stress Test     Findings are consistent with infarction. The study is intermediate risk.   Exercise capacity was normal. Patient exercised for 6 min. Maximum HR of 122 bpm. MPHR 87.0%. Peak METS 7.0.   No ST deviation was noted.   LV perfusion is abnormal. There is no evidence of ischemia. There is evidence of infarction. Defect 1: There is a medium defect with moderate reduction in uptake present in the apical to basal inferior and inferolateral location(s) that is partially reversible. There is abnormal wall motion in the defect area. Consistent with infarction.   Left ventricular function is abnormal. Global function is mildly reduced. Nuclear stress EF: 47%. The left ventricular ejection fraction is mildly decreased (45-54%). End diastolic cavity size is moderately enlarged. End systolic cavity size is mildly enlarged.   Prior study not available for comparison.   Perfusion defect that improves slightly with stress in inferior and inferolateral wall. Mildly reduced LVEF with wall motion abnormalities in inferior and inferoseptal walls. No evidence of ischemia.  Risk Assessment/Calculations:     Physical Exam:   VS:  BP (!) 106/58  Pulse 62   Ht 5' 11 (1.803 m)   Wt 175 lb 9.6 oz (79.7 kg)   SpO2 95%   BMI 24.49 kg/m    Wt Readings from Last 3 Encounters:  03/17/23 175 lb 9.6 oz (79.7 kg)  01/25/23 172 lb (78 kg)  01/16/23 171 lb (77.6 kg)     Physical Exam Vitals reviewed.  Constitutional:      Appearance: Normal appearance.  HENT:     Head: Normocephalic.  Eyes:     Pupils: Pupils are equal, round, and reactive to  light.  Cardiovascular:     Rate and Rhythm: Normal rate and regular rhythm.     Pulses: Normal pulses.     Heart sounds: Murmur (faint, systolic, over RUSB) heard.  Pulmonary:     Effort: Pulmonary effort is normal.     Breath sounds: Normal breath sounds.  Abdominal:     General: Abdomen is flat.     Palpations: Abdomen is soft.  Musculoskeletal:     Right lower leg: No edema.     Left lower leg: No edema.  Skin:    General: Skin is warm and dry.     Capillary Refill: Capillary refill takes less than 2 seconds.  Neurological:     General: No focal deficit present.     Mental Status: He is alert and oriented to person, place, and time.  Psychiatric:        Mood and Affect: Mood normal.        Behavior: Behavior normal.        Thought Content: Thought content normal.        Judgment: Judgment normal.      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 Estimated right ventricular systolic pressure is 74.6 mmHg.  -Given ongoing fatigue, exertional intolerance and new echo findings suspicious for pulmonary hypertension, will arrange for right heart catheterization to confirm diagnosis and quantify pressure. -Consider adding Jardiance  post-catheterization.  Coronary Artery Disease S/p CABG X 3 (LIMA-LAD, SVG-dLCx, SVG-RCA). Recent stress test showed intermediate risk study with abnormal LV perfusion consistent with prior heart injury, but no acute ischemia. -Continue aspirin , metoprolol , and atorvastatin  80mg  daily. -Check LDL direct today to monitor cholesterol management.  Bioprosthetic Aortic Valve Endocarditis Status post bioprosthetic aortic valve replacement with a 23mm Edwards Inspiris Resilia pericardial valve. History of bacteremia and prosthetic valve endocarditis with moderate aortic stenosis as of June 2024  echocardiogram. Recent echocardiogram showed prosthetic aortic valve well seated without vegetation or stenosis and with stable function.  -Continue cefadroxil  for prophylaxis. -Plan for annual echocardiogram to monitor valve function.  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 -Continue metoprolol  and torsemide  10mg  BID.  -Consider adding Jardiance  post-catheterization (previously stopped due to hypotension following June 2024 admission, now recovered to normal BP).   Hyponatremia History of hyponatremia. -Check BMP prior to RHC.    Erectile dysfunction Patient reports ongoing issue with this despite Sildenafil .  -Discussed that he may need urology evaluation for further management.          Informed Consent   Shared Decision Making/Informed Consent The risks [stroke (1 in 1000), death (1 in 1000), kidney failure [usually temporary] (1 in 500), bleeding (1 in 200), allergic reaction [possibly serious] (1 in 200)],  benefits (diagnostic support and management of coronary artery disease) and alternatives of a cardiac catheterization were discussed in detail with Mr. Ramson and he is willing to proceed.      Signed, Artist Pouch, PA-C

## 2023-03-17 NOTE — Patient Instructions (Signed)
 Medication Instructions:    Your physician recommends that you continue on your current medications as directed. Please refer to the Current Medication list given to you today.   *If you need a refill on your cardiac medications before your next appointment, please call your pharmacy*   Lab Work:  PLEASE GO DOWN STAIRS  [facility]  FIRST FLOOR  [address] ( GET OFF ELEVATORS MAKE A LEFT AND ANOTHER LEFT LAB ON RIGHT DOWN HALLWAY :   BMET AND CBC   LDL DIRECT  TODAY       If you have labs (blood work) drawn today and your tests are completely normal, you will receive your results only by: MyChart Message (if you have MyChart) OR A paper copy in the mail If you have any lab test that is abnormal or we need to change your treatment, we will call you to review the results.   Testing/Procedures:  ON 03-21-23  Your physician has requested that you have a cardiac catheterization. Cardiac catheterization is used to diagnose and/or treat various heart conditions. Doctors may recommend this procedure for a number of different reasons. The most common reason is to evaluate chest pain. Chest pain can be a symptom of coronary artery disease (CAD), and cardiac catheterization can show whether plaque is narrowing or blocking your heart's arteries. This procedure is also used to evaluate the valves, as well as measure the blood flow and oxygen levels in different parts of your heart. For further information please visit [email or web address] Please follow instruction sheet, as given.  SEE LETTER ATTACHED      Follow-Up: At [facility], you and your health needs are our priority.  As part of our continuing mission to provide you with exceptional heart care, we have created designated Provider Care Teams.  These Care Teams include your primary Cardiologist (physician) and Advanced Practice Providers (APPs -  Physician Assistants and Nurse Practitioners) who all work together to provide you with the care  you need, when you need it.  We recommend signing up for the patient portal called MyChart.  Sign up information is provided on this After Visit Summary.  MyChart is used to connect with patients for Virtual Visits (Telemedicine).  Patients are able to view lab/test results, encounter notes, upcoming appointments, etc.  Non-urgent messages can be sent to your provider as well.   To learn more about what you can do with MyChart, go to [email or web address].    Your next appointment:  AS SCHEDULED  DR GEORGIAN     Other Instructions    1st Floor: - Lobby - Registration  - Pharmacy  - Lab - Cafe  2nd Floor: - PV Lab - Diagnostic Testing (echo, CT, nuclear med)  3rd Floor: - Vacant  4th Floor: - TCTS (cardiothoracic surgery) - AFib Clinic - Structural Heart Clinic - Vascular Surgery  - Vascular Ultrasound  5th Floor: - [facility] (general and EP) - Clinical Pharmacy for coumadin, hypertension, lipid, weight-loss medications, and med management appointments    Valet parking services will be available as well.

## 2023-03-18 LAB — CBC
Hematocrit: 31.7 % — ABNORMAL LOW (ref 37.5–51.0)
Hemoglobin: 10.5 g/dL — ABNORMAL LOW (ref 13.0–17.7)
MCH: 32.8 pg (ref 26.6–33.0)
MCHC: 33.1 g/dL (ref 31.5–35.7)
MCV: 99 fL — ABNORMAL HIGH (ref 79–97)
Platelets: 123 10*3/uL — ABNORMAL LOW (ref 150–450)
RBC: 3.2 x10E6/uL — ABNORMAL LOW (ref 4.14–5.80)
RDW: 11.4 % — ABNORMAL LOW (ref 11.6–15.4)
WBC: 3.3 10*3/uL — ABNORMAL LOW (ref 3.4–10.8)

## 2023-03-18 LAB — BASIC METABOLIC PANEL
BUN/Creatinine Ratio: 14 (ref 10–24)
BUN: 14 mg/dL (ref 8–27)
CO2: 23 mmol/L (ref 20–29)
Calcium: 8.9 mg/dL (ref 8.6–10.2)
Chloride: 97 mmol/L (ref 96–106)
Creatinine, Ser: 0.98 mg/dL (ref 0.76–1.27)
Glucose: 99 mg/dL (ref 70–99)
Potassium: 4.3 mmol/L (ref 3.5–5.2)
Sodium: 134 mmol/L (ref 134–144)
eGFR: 77 mL/min/{1.73_m2} (ref >59)

## 2023-03-18 LAB — LDL CHOLESTEROL, DIRECT: LDL Direct: 53 mg/dL (ref 0–99)

## 2023-03-20 ENCOUNTER — Telehealth: Payer: Self-pay | Admitting: *Deleted

## 2023-03-20 NOTE — Telephone Encounter (Signed)
 Right Heart Cath scheduled at [facility] for: Tuesday March 21, 2023 10:30 AM Arrival time [facility] Main Entrance A at: 8:30 AM  Nothing to eat after midnight prior to procedure, clear liquids until 5 AM day of procedure.  Medication instructions -Hold:  Torsemide -AM of procedure  Patient will hold supplements/vitamins AM of procedure -Other usual morning medications can be taken with sips of water .  Plan to go home the same day, you will only stay overnight if medically necessary.  You must have responsible adult to drive you home.  Someone must be with you the first 24 hours after you arrive home.  Reviewed procedure instructions with patient.

## 2023-03-21 ENCOUNTER — Other Ambulatory Visit: Payer: Self-pay

## 2023-03-21 ENCOUNTER — Encounter (HOSPITAL_COMMUNITY): Payer: Self-pay | Admitting: Cardiology

## 2023-03-21 ENCOUNTER — Ambulatory Visit (HOSPITAL_COMMUNITY)
Admission: RE | Admit: 2023-03-21 | Discharge: 2023-03-21 | Disposition: A | Discharge status: Home / Self Care | Payer: Medicare PPO | Attending: Cardiology | Admitting: Cardiology

## 2023-03-21 ENCOUNTER — Encounter (HOSPITAL_COMMUNITY)
Admission: RE | Disposition: A | Discharge status: Home / Self Care | Payer: Self-pay | Source: Home / Self Care | Attending: Cardiology

## 2023-03-21 DIAGNOSIS — Z79899 Other long term (current) drug therapy: Secondary | ICD-10-CM | POA: Diagnosis not present

## 2023-03-21 DIAGNOSIS — Z951 Presence of aortocoronary bypass graft: Secondary | ICD-10-CM | POA: Diagnosis not present

## 2023-03-21 DIAGNOSIS — I5042 Chronic combined systolic (congestive) and diastolic (congestive) heart failure: Secondary | ICD-10-CM | POA: Diagnosis not present

## 2023-03-21 DIAGNOSIS — Z953 Presence of xenogenic heart valve: Secondary | ICD-10-CM | POA: Diagnosis not present

## 2023-03-21 DIAGNOSIS — Z7982 Long term (current) use of aspirin: Secondary | ICD-10-CM | POA: Insufficient documentation

## 2023-03-21 DIAGNOSIS — R0609 Other forms of dyspnea: Secondary | ICD-10-CM | POA: Insufficient documentation

## 2023-03-21 DIAGNOSIS — Z8679 Personal history of other diseases of the circulatory system: Secondary | ICD-10-CM | POA: Insufficient documentation

## 2023-03-21 DIAGNOSIS — I2510 Atherosclerotic heart disease of native coronary artery without angina pectoris: Secondary | ICD-10-CM | POA: Diagnosis not present

## 2023-03-21 DIAGNOSIS — N529 Male erectile dysfunction, unspecified: Secondary | ICD-10-CM | POA: Diagnosis not present

## 2023-03-21 HISTORY — PX: RIGHT HEART CATH: CATH118263

## 2023-03-21 LAB — POCT I-STAT EG7
Acid-Base Excess: 0 mmol/L (ref 0.0–2.0)
Acid-Base Excess: 0 mmol/L (ref 0.0–2.0)
Bicarbonate: 25.3 mmol/L (ref 20.0–28.0)
Bicarbonate: 25.4 mmol/L (ref 20.0–28.0)
Calcium, Ion: 1.24 mmol/L (ref 1.15–1.40)
Calcium, Ion: 1.25 mmol/L (ref 1.15–1.40)
HCT: 31 % — ABNORMAL LOW (ref 39.0–52.0)
HCT: 32 % — ABNORMAL LOW (ref 39.0–52.0)
Hemoglobin: 10.5 g/dL — ABNORMAL LOW (ref 13.0–17.0)
Hemoglobin: 10.9 g/dL — ABNORMAL LOW (ref 13.0–17.0)
O2 Saturation: 67 %
O2 Saturation: 70 %
Potassium: 3.8 mmol/L (ref 3.5–5.1)
Potassium: 3.8 mmol/L (ref 3.5–5.1)
Sodium: 138 mmol/L (ref 135–145)
Sodium: 139 mmol/L (ref 135–145)
TCO2: 27 mmol/L (ref 22–32)
TCO2: 27 mmol/L (ref 22–32)
pCO2, Ven: 43.8 mm[Hg] — ABNORMAL LOW (ref 44–60)
pCO2, Ven: 44 mm[Hg] (ref 44–60)
pH, Ven: 7.367 (ref 7.25–7.43)
pH, Ven: 7.37 (ref 7.25–7.43)
pO2, Ven: 36 mm[Hg] (ref 32–45)
pO2, Ven: 38 mm[Hg] (ref 32–45)

## 2023-03-21 SURGERY — RIGHT HEART CATH
Anesthesia: LOCAL

## 2023-03-21 MED ORDER — HYDRALAZINE HCL 20 MG/ML IJ SOLN: 10.0000 mg | INTRAMUSCULAR | Status: DC | PRN

## 2023-03-21 MED ORDER — SODIUM CHLORIDE 0.9 % IV SOLN: INTRAVENOUS | Status: DC

## 2023-03-21 MED ORDER — ONDANSETRON HCL 4 MG/2ML IJ SOLN: 4.0000 mg | Freq: Four times a day (QID) | INTRAMUSCULAR | Status: DC | PRN

## 2023-03-21 MED ORDER — HEPARIN (PORCINE) IN NACL 1000-0.9 UT/500ML-% IV SOLN
INTRAVENOUS | Status: DC | PRN
Start: 2023-03-21 — End: 2023-03-21
  Administered 2023-03-21: 500 mL

## 2023-03-21 MED ORDER — LABETALOL HCL 5 MG/ML IV SOLN: 10.0000 mg | INTRAVENOUS | Status: DC | PRN

## 2023-03-21 MED ORDER — ACETAMINOPHEN 325 MG PO TABS: 650.0000 mg | ORAL_TABLET | ORAL | Status: DC | PRN

## 2023-03-21 MED ORDER — SODIUM CHLORIDE 0.9 % IV SOLN: 250.0000 mL | INTRAVENOUS | Status: DC | PRN

## 2023-03-21 MED ORDER — SODIUM CHLORIDE 0.9% FLUSH: 3.0000 mL | Freq: Two times a day (BID) | INTRAVENOUS | Status: DC

## 2023-03-21 MED ORDER — LIDOCAINE HCL (PF) 1 % IJ SOLN
INTRAMUSCULAR | Status: DC | PRN
Start: 2023-03-21 — End: 2023-03-21
  Administered 2023-03-21: 1 mL

## 2023-03-21 MED ORDER — SODIUM CHLORIDE 0.9% FLUSH: 3.0000 mL | INTRAVENOUS | Status: DC | PRN

## 2023-03-21 SURGICAL SUPPLY — 6 items
CATH BALLN WEDGE 5F 110CM (CATHETERS) IMPLANT
GUIDEWIRE .025 260CM (WIRE) IMPLANT
PACK CARDIAC CATHETERIZATION (CUSTOM PROCEDURE TRAY) IMPLANT
SHEATH GLIDE SLENDER 4/5FR (SHEATH) IMPLANT
TRANSDUCER W/STOPCOCK (MISCELLANEOUS) IMPLANT
TUBING ART PRESS 72 MALE/FEM (TUBING) IMPLANT

## 2023-03-21 NOTE — H&P (Signed)
 OV 03/17/2023 copied for documentation   Cardiology Office Note:   Date:  03/21/2023  ID:  [name], DOB 08/16/1940, MRN [id number] PCP: Caesar Bookman, NP  [facility] Providers Cardiologist:  None    History of Present Illness:   Discussed the use of AI scribe software for clinical note transcription with the patient, who gave verbal consent to proceed.  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  In June 2024, he was hospitalized and found with positive blood cultures along with prosthetic valve endocarditis. He was treated with IV rocephin. Prior to this hospitalization, he was very active, engaging in activities such as disc golf and bicycling without difficulty.  He presents with ongoing issues with decreased exertional tolerance, describing it as similar to symptoms prior to his CABG and aortic valve repair. Since his hospitalization, he has experienced progressive shortness of breath and reduced physical activity tolerance. He can no longer complete a full round of disc golf without stopping due to shortness of breath, whereas previously, his exertional capacity was 'almost unlimited'.  In December 2024, a nuclear stress test indicated an intermediate risk with abnormal left ventricular (LV) perfusion consistent with prior heart injury, but no acute ischemia. An echocardiogram showed a normal left ventricular ejection fraction of 50-55%, a well-seated prosthetic aortic valve without vegetation or stenosis, and a mean gradient of 9 mmHg. However, it also revealed new evidence of pulmonary hypertension with low normal right ventricular function and elevated pulmonary artery  systolic pressure.  No chest discomfort, palpitations, dizziness, or lightheadedness. No swelling in the legs or abdomen. He reports no issues with sleeping and no orthopnea. He mentions erectile dysfunction and notes that sildenafil has not been very effective.  His current medications include metoprolol, torsemide (twice daily), and cefadroxil for ongoing antibiotic prophylaxis due to his history of prosthetic valve endocarditis. He also takes atorvastatin for cholesterol management.    Studies Reviewed:    EKG:       03/13/23 TTE  IMPRESSIONS     1. Left ventricular ejection fraction, by estimation, is 50 to 55%. The  left ventricle has low normal function. The left ventricle has no regional  wall motion abnormalities. There is mild left ventricular hypertrophy.  Left ventricular diastolic  parameters are indeterminate.   2. Right ventricular systolic function is low normal. The right  ventricular size is mildly enlarged. There is severely elevated pulmonary  artery systolic pressure. The estimated right ventricular systolic  pressure is 74.6 mmHg.   3. Left atrial size was moderately dilated.   4. Right atrial size was severely dilated.   5. The mitral valve is normal in structure. Mild to moderate mitral valve  regurgitation. No evidence of mitral stenosis.   6. Tricuspid valve regurgitation is moderate.   7. S/P 23 mm Edward Inspiris Valve well seated, no evidence of  dehiscence, no valvular regurgitation or stenosis. Peak velocity 2.31m/s,  MG 9 mmHG, DI 0.39, AVA VTI 1.23cm2.   8. Aortic dilatation noted. There is dilatation of the ascending aorta,  measuring 39 mm.   9. The inferior vena cava is dilated in size with >50% respiratory  variability, suggesting right atrial pressure of 8 mmHg.   FINDINGS   Left  Ventricle: Left ventricular ejection fraction, by estimation, is 50  to 55%. The left ventricle has low normal function. The left ventricle has  no regional wall  motion abnormalities. The left ventricular internal  cavity size was normal in size.  There is mild left ventricular hypertrophy. Left ventricular diastolic  parameters are indeterminate.   Right Ventricle: The right ventricular size is mildly enlarged. No  increase in right ventricular wall thickness. Right ventricular systolic  function is low normal. There is severely elevated pulmonary artery  systolic pressure. The tricuspid regurgitant  velocity is 4.08 m/s, and with an assumed right atrial pressure of 8 mmHg,  the estimated right ventricular systolic pressure is 74.6 mmHg.   Left Atrium: Left atrial size was moderately dilated.   Right Atrium: Right atrial size was severely dilated.   Pericardium: There is no evidence of pericardial effusion.   Mitral Valve: The mitral valve is normal in structure. Mild to moderate  mitral valve regurgitation. No evidence of mitral valve stenosis.   Tricuspid Valve: The tricuspid valve is normal in structure. Tricuspid  valve regurgitation is moderate . No evidence of tricuspid stenosis.   Aortic Valve: S/P 23 mm Edward Inspiris Valve well seated, no evidence of  dehiscence, no valvular regurgitation or stenosis. Peak velocity 2.66m/s,  MG 9 mmHG, DI 0.39, AVA VTI 1.23cm2. Aortic valve mean gradient measures  9.0 mmHg. Aortic valve peak  gradient measures 16.5 mmHg. Aortic valve area, by VTI measures 1.23 cm.   Pulmonic Valve: The pulmonic valve was normal in structure. Pulmonic valve  regurgitation is mild. No evidence of pulmonic stenosis.   Aorta: The aortic root is normal in size and structure and aortic  dilatation noted. There is dilatation of the ascending aorta, measuring 39  mm.   Venous: The inferior vena cava is dilated in size with greater than 50%  respiratory variability, suggesting right atrial pressure of 8 mmHg.   IAS/Shunts: The atrial septum is grossly normal.    01/16/23 Myoview Stress Test     Findings are  consistent with infarction. The study is intermediate risk.   Exercise capacity was normal. Patient exercised for 6 min. Maximum HR of 122 bpm. MPHR 87.0%. Peak METS 7.0.   No ST deviation was noted.   LV perfusion is abnormal. There is no evidence of ischemia. There is evidence of infarction. Defect 1: There is a medium defect with moderate reduction in uptake present in the apical to basal inferior and inferolateral location(s) that is partially reversible. There is abnormal wall motion in the defect area. Consistent with infarction.   Left ventricular function is abnormal. Global function is mildly reduced. Nuclear stress EF: 47%. The left ventricular ejection fraction is mildly decreased (45-54%). End diastolic cavity size is moderately enlarged. End systolic cavity size is mildly enlarged.   Prior study not available for comparison.   Perfusion defect that improves slightly with stress in inferior and inferolateral wall. Mildly reduced LVEF with wall motion abnormalities in inferior and inferoseptal walls. No evidence of ischemia.  Risk Assessment/Calculations:     Physical Exam:   VS:  Ht 5\' 11"  (1.803 m)   Wt 77.1 kg   BMI 23.71 kg/m    Wt Readings from Last 3 Encounters:  03/21/23 77.1 kg  03/17/23 79.7 kg  01/25/23 78 kg     Physical Exam Vitals reviewed.  Constitutional:      Appearance: Normal appearance.  HENT:     Head: Normocephalic.  Eyes:  Pupils: Pupils are equal, round, and reactive to light.  Cardiovascular:     Rate and Rhythm: Normal rate and regular rhythm.     Pulses: Normal pulses.     Heart sounds: Murmur (faint, systolic, over RUSB) heard.  Pulmonary:     Effort: Pulmonary effort is normal.     Breath sounds: Normal breath sounds.  Abdominal:     General: Abdomen is flat.     Palpations: Abdomen is soft.  Musculoskeletal:     Right lower leg: No edema.     Left lower leg: No edema.  Skin:    General: Skin is warm and dry.     Capillary  Refill: Capillary refill takes less than 2 seconds.  Neurological:     General: No focal deficit present.     Mental Status: He is alert and oriented to person, place, and time.  Psychiatric:        Mood and Affect: Mood normal.        Behavior: Behavior normal.        Thought Content: Thought content normal.        Judgment: Judgment normal.      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 Estimated right ventricular systolic pressure is 74.6 mmHg.  -Given ongoing fatigue, exertional intolerance and new echo findings suspicious for pulmonary hypertension, will arrange for right heart catheterization to confirm diagnosis and quantify pressure. -Consider adding Jardiance post-catheterization.  Coronary Artery Disease S/p CABG X 3 (LIMA-LAD, SVG-dLCx, SVG-RCA). Recent stress test showed intermediate risk study with abnormal LV perfusion consistent with prior heart injury, but no acute ischemia. -Continue aspirin, metoprolol, and atorvastatin 80mg  daily. -Check LDL direct today to monitor cholesterol management.  Bioprosthetic Aortic Valve Endocarditis Status post bioprosthetic aortic valve replacement with a 23mm Edwards Inspiris Resilia pericardial valve. History of bacteremia and prosthetic valve endocarditis with moderate aortic stenosis as of June 2024 echocardiogram. Recent echocardiogram showed prosthetic aortic valve well seated without vegetation or stenosis and with stable function.  -Continue cefadroxil for prophylaxis. -Plan for annual echocardiogram to monitor valve function.  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 -Continue metoprolol and torsemide 10mg  BID.  -Consider adding Jardiance  post-catheterization (previously stopped due to hypotension following June 2024 admission, now recovered to normal BP).   Hyponatremia History of hyponatremia. -Check BMP prior to RHC.    Erectile dysfunction Patient reports ongoing issue with this despite Sildenafil.  -Discussed that he may need urology evaluation for further management.          Informed Consent   Shared Decision Making/Informed Consent The risks [stroke (1 in 1000), death (1 in 1000), kidney failure [usually temporary] (1 in 500), bleeding (1 in 200), allergic reaction [possibly serious] (1 in 200)], benefits (diagnostic support and management of coronary artery disease) and alternatives of a cardiac catheterization were discussed in detail with Mr. Trompeter and he is willing to proceed.      Signed, Elder Negus, MD

## 2023-03-21 NOTE — Discharge Instructions (Signed)

## 2023-03-21 NOTE — Interval H&P Note (Signed)
History and Physical Interval Note:  03/21/2023 8:48 AM  [name]  has presented today for surgery, with the diagnosis of pulmonary hypertension.  The various methods of treatment have been discussed with the patient and family. After consideration of risks, benefits and other options for treatment, the patient has consented to  Procedure(s): RIGHT HEART CATH (N/A) as a surgical intervention.  The patient's history has been reviewed, patient examined, no change in status, stable for surgery.  I have reviewed the patient's chart and labs.  Questions were answered to the patient's satisfaction.     Antawan Mchugh J Kilyn Maragh

## 2023-03-23 ENCOUNTER — Telehealth: Payer: Self-pay

## 2023-03-23 DIAGNOSIS — D696 Thrombocytopenia, unspecified: Secondary | ICD-10-CM

## 2023-03-23 NOTE — Telephone Encounter (Signed)
Spoke with patient concerning recent lab work, patient will have repeat CBC drawn the second week of March. No further needs at this time

## 2023-03-23 NOTE — Telephone Encounter (Signed)
-----   Message from Perlie Gold sent at 03/20/2023  9:39 PM EST ----- CBC with stable anemia (decreased HGB and Hematocrit). Platelet count is down slightly from previous. LDL direct is fantastic at 53. Metabolic panel normal. Given platelet count decreased from previous, would recommend that we plan to repeat in a month, just to ensure that the trend does not continue.

## 2023-03-24 ENCOUNTER — Ambulatory Visit: Payer: Medicare PPO | Admitting: Family

## 2023-03-24 ENCOUNTER — Encounter: Payer: Self-pay | Admitting: Family

## 2023-03-24 VITALS — BP 128/68 | HR 53 | Temp 98.0°F | Resp 18 | Ht 71.0 in | Wt 172.6 lb

## 2023-03-24 DIAGNOSIS — E782 Mixed hyperlipidemia: Secondary | ICD-10-CM

## 2023-03-24 DIAGNOSIS — Z95811 Presence of heart assist device: Secondary | ICD-10-CM | POA: Diagnosis not present

## 2023-03-24 DIAGNOSIS — N529 Male erectile dysfunction, unspecified: Secondary | ICD-10-CM

## 2023-03-24 DIAGNOSIS — I10 Essential (primary) hypertension: Secondary | ICD-10-CM

## 2023-03-29 ENCOUNTER — Encounter: Payer: Self-pay | Admitting: Gastroenterology

## 2023-03-29 DIAGNOSIS — Z95811 Presence of heart assist device: Secondary | ICD-10-CM | POA: Insufficient documentation
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
## 2023-03-31 DIAGNOSIS — N401 Enlarged prostate with lower urinary tract symptoms: Secondary | ICD-10-CM | POA: Diagnosis not present

## 2023-03-31 DIAGNOSIS — N529 Male erectile dysfunction, unspecified: Secondary | ICD-10-CM | POA: Diagnosis not present

## 2023-03-31 DIAGNOSIS — R350 Frequency of micturition: Secondary | ICD-10-CM | POA: Diagnosis not present

## 2023-04-06 ENCOUNTER — Other Ambulatory Visit: Payer: Medicare PPO

## 2023-04-06 ENCOUNTER — Inpatient Hospital Stay: Payer: Medicare PPO | Attending: Oncology | Admitting: Oncology

## 2023-04-06 ENCOUNTER — Inpatient Hospital Stay: Payer: Medicare PPO

## 2023-04-06 VITALS — BP 119/61 | HR 60 | Temp 97.8°F | Resp 18 | Ht 71.0 in | Wt 172.9 lb

## 2023-04-06 DIAGNOSIS — D709 Neutropenia, unspecified: Secondary | ICD-10-CM | POA: Diagnosis not present

## 2023-04-06 DIAGNOSIS — D649 Anemia, unspecified: Secondary | ICD-10-CM | POA: Diagnosis not present

## 2023-04-06 DIAGNOSIS — K769 Liver disease, unspecified: Secondary | ICD-10-CM

## 2023-04-06 LAB — CBC WITH DIFFERENTIAL (CANCER CENTER ONLY)
Abs Immature Granulocytes: 0.01 10*3/uL (ref 0.00–0.07)
Basophils Absolute: 0 10*3/uL (ref 0.0–0.1)
Basophils Relative: 1 %
Eosinophils Absolute: 0.2 10*3/uL (ref 0.0–0.5)
Eosinophils Relative: 4 %
HCT: 35.4 % — ABNORMAL LOW (ref 39.0–52.0)
Hemoglobin: 11.9 g/dL — ABNORMAL LOW (ref 13.0–17.0)
Immature Granulocytes: 0 %
Lymphocytes Relative: 16 %
Lymphs Abs: 0.8 10*3/uL (ref 0.7–4.0)
MCH: 32.9 pg (ref 26.0–34.0)
MCHC: 33.6 g/dL (ref 30.0–36.0)
MCV: 97.8 fL (ref 80.0–100.0)
Monocytes Absolute: 0.6 10*3/uL (ref 0.1–1.0)
Monocytes Relative: 11 %
Neutro Abs: 3.5 10*3/uL (ref 1.7–7.7)
Neutrophils Relative %: 68 %
Platelet Count: 130 10*3/uL — ABNORMAL LOW (ref 150–400)
RBC: 3.62 MIL/uL — ABNORMAL LOW (ref 4.22–5.81)
RDW: 12 % (ref 11.5–15.5)
WBC Count: 5.1 10*3/uL (ref 4.0–10.5)
nRBC: 0 % (ref 0.0–0.2)

## 2023-04-06 NOTE — Progress Notes (Signed)
  [facility] OFFICE PROGRESS NOTE   Diagnosis: Anemia/neutropenia  INTERVAL HISTORY:   [name] returns as scheduled.  Feels well.  No fever, night sweats, or recent infection.  He is getting married this weekend.  Objective:  Vital signs in last 24 hours:  Blood pressure 119/61, pulse 60, temperature 97.8 F (36.6 C), temperature source Temporal, resp. rate 18, height 5\' 11"  (1.803 m), weight 172 lb 14.4 oz (78.4 kg), SpO2 98%.    Lymphatics: No cervical, supraclavicular, axillary, or inguinal nodes Resp: Lungs clear bilaterally Cardio: Irregular GI: No hepatosplenomegaly Vascular: No leg edema  Lab Results:  Lab Results  Component Value Date   WBC 5.1 04/06/2023   HGB 11.9 (L) 04/06/2023   HCT 35.4 (L) 04/06/2023   MCV 97.8 04/06/2023   PLT 130 (L) 04/06/2023   NEUTROABS 3.5 04/06/2023    CMP  Lab Results  Component Value Date   NA 139 03/21/2023   K 3.8 03/21/2023   CL 97 03/17/2023   CO2 23 03/17/2023   GLUCOSE 99 03/17/2023   BUN 14 03/17/2023   CREATININE 0.98 03/17/2023   CALCIUM 8.9 03/17/2023   PROT 6.8 01/25/2023   ALBUMIN 2.6 (L) 09/17/2022   AST 19 01/25/2023   ALT 15 01/25/2023   ALKPHOS 48 09/17/2022   BILITOT 0.7 01/25/2023   GFRNONAA >60 09/17/2022   GFRAA >60 08/08/2019     Medications: I have reviewed the patient's current medications.   Assessment/Plan: 1. Severe anemia/leukopenia with severe neutropenia Bone marrow biopsy 09/15/2022-hypercellular bone marrow for age with granulocytic hyperplasia; significant dyspoiesis or increase in blastic cells not identified; overall changes likely secondary/regenerative in nature; flow cytometry with no significant CD34 positive blastic population and no monoclonal B-cell population or significant T-cell abnormalities; cytogenetics normal. 2.  History of vitamin B12 deficiency 3.  Hyponatremia 4.  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  History of  CAD 6.  CHF 7.  Gout 8.  Indeterminate posterior right liver and renal lesions on CT 07/28/2022.  MRI liver 10/13/2022-unchanged essentially nonenhancing lesion of the posterior right lobe of the liver, hepatic segment 7 with overlying capsular retraction and heterogeneous internal fluid signal measuring approximately 3.1 x 2.0 cm, favored benign.  Benign fluid signal renal cortical cyst for which no further follow-up for characterization is required.  Kidneys otherwise normal.    Disposition: [name] developed severe anemia and neutropenia last year.  The hematologic findings may have been related to endocarditis. The neutrophil count is now normal.  He has mild anemia and thrombocytopenia.  The plan is to continue observation.  He will return for an office and lab visit in 6 months.   Thornton Papas, MD  04/06/2023  3:20 PM

## 2023-04-07 DIAGNOSIS — N529 Male erectile dysfunction, unspecified: Secondary | ICD-10-CM | POA: Diagnosis not present

## 2023-04-10 ENCOUNTER — Encounter: Payer: Self-pay | Admitting: Cardiology

## 2023-04-10 ENCOUNTER — Ambulatory Visit: Payer: Medicare PPO | Attending: Cardiology | Admitting: Cardiology

## 2023-04-10 VITALS — BP 140/78 | HR 64

## 2023-04-10 DIAGNOSIS — I5020 Unspecified systolic (congestive) heart failure: Secondary | ICD-10-CM | POA: Diagnosis not present

## 2023-04-10 DIAGNOSIS — I491 Atrial premature depolarization: Secondary | ICD-10-CM

## 2023-04-10 DIAGNOSIS — I493 Ventricular premature depolarization: Secondary | ICD-10-CM | POA: Diagnosis not present

## 2023-04-10 DIAGNOSIS — I2510 Atherosclerotic heart disease of native coronary artery without angina pectoris: Secondary | ICD-10-CM | POA: Diagnosis not present

## 2023-04-10 NOTE — Progress Notes (Signed)
 Cardiology Office Note:  .   Date:  04/10/2023  ID:  [name], DOB 09/06/1940, MRN [id number] PCP: Caesar Bookman, NP  [facility] Providers Cardiologist:  Truett Mainland, MD PCP: Caesar Bookman, NP  Chief Complaint  Patient presents with   S/p AVR      History of Present Illness: [name] is a 83 y.o. 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  Patient reports generalized fatigue, but denies any chest pain or shortness of breath symptoms.  She is try to increase his physical activity, plays disc golf regularly.     Vitals:   04/10/23 0902  BP: (!) 140/78  Pulse: 64  SpO2: 98%      ROS:  Review of Systems  Constitutional: Positive for malaise/fatigue (Decreased exercise tolerance).  Cardiovascular:  Positive for dyspnea on exertion. Negative for chest pain, leg swelling, palpitations and syncope.     Studies Reviewed: Marland Kitchen        Independently interpreted EKG 04/10/2023: Sinus bradycardia with 1st degree A-V block with occasional Premature ventricular complexes and Premature atrial complexes with ventricular escape complexes Right bundle branch block When compared with ECG of 17-Mar-2023 13:26, Premature ventricular complexes are now Present Premature atrial complexes are now Present  Nonspecific T wave abnormality now evident in Inferior leads     Right heart catheterization 03/21/2023: RA: 4 mmHg RV: 27/0 mmHg PA: 28/9 mmHg, mPAP 16 mmHg PCW: 16 mmHg   AO sats: 100% PA sats: 68%   CO: 5.7 L/min CI: 2.9 L/min/m2   Direct right heart pressure measurement does not  correlate with recent ehccoardiographic measurement of RVSP 74 mmHg.   Independently interpreted 03/2023: Hb 11.9 Cr 0.98  Independently interpreted 01/2023: Chol 143, TG 73, HDL 58, LDL 71 HbA1C NA Hb 10.9 Cr 0.9    Lipid panel 07/2022: Chol 212, TG 90, HDL 33, LDL 161  EKG 09/23/2022: Sinus rhythm 72 bpm  Frequent PACs First degree AV block Right bundle branch block  Echocardiogram 03/13/2023: Left ventricle cavity is normal in size. Mild concentric hypertrophy of the left ventricle. Mild global hypokinesis. LVEF around 50%. Doppler evidence of grade II (pseudonormal) diastolic dysfunction, elevated LAP.  Left atrial cavity is severely dilated. Prosthetic23 mm Edwards inspiris resilia pericardial valve. Mobile echodense structure attached on aortic side of the valve, consistent with vegetation. Moderate aortic stenosis. Vmax 2.4 m/sec, mean PG 11 mmHg, AVA 1.2 cm by continuity equation. No aortic valve regurgitation. Structurally normal mitral valve. Moderate (Grade II) mitral regurgitation. Mild to moderate tricuspid regurgitation. No evidence of pulmonary hypertension. Compared to previous studies in [address]. EF has improved from 35-40%. Aortic vegetation remains.    Physical Exam:   Physical Exam Vitals and nursing note reviewed.  Constitutional:      General: He is not in acute distress. Neck:     Vascular: No JVD.  Cardiovascular:     Rate and Rhythm: Normal rate and regular rhythm. Frequent Extrasystoles are present.    Heart sounds: Murmur heard.     Harsh midsystolic murmur is present with a grade of 2/6 at the upper right sternal border radiating to the  neck.  Pulmonary:     Effort: Pulmonary effort is normal.     Breath sounds: Normal breath sounds. No wheezing or rales.      VISIT DIAGNOSES: No diagnosis found.    ASSESSMENT AND PLAN: .    [name] is a 83 y.o. 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  Physical exam is unremarkable.  Normal echocardiogram with suspicion of pulmonary hypertension, right heart catheterization shows perfectly normal filling pressures, with no pulmonary hypertension.  I suspect his symptoms are likely of noncardiac etiology.  Prosthetic valve endocarditis: Vegetation noted on posterior aortic valve, in the setting of Strept sanguinis bacteremia. Cardiothoracic surgery consulted and hospitalization, no indication for surgery. Currently on cefadroxil 500 mg daily lifelong, as per ID recommendations   HFrEF: EF 35-40% with acute heart failure decompensation in 07/2022. EF recovered to 50% (03/2023).Did not tolerate most GDMT due to hypotension/lightheadedness. Patient appears euvolemic at this time. Medical management as above.  PAC, PVC: Noted on EKG.  Symptoms do not correlate to PACs/PVC.     F/u in 6 months    Signed, Elder Negus, MD

## 2023-04-10 NOTE — Patient Instructions (Signed)
  Follow-Up: At [facility], you and your health needs are our priority.  As part of our continuing mission to provide you with exceptional heart care, we have created designated Provider Care Teams.  These Care Teams include your primary Cardiologist (physician) and Advanced Practice Providers (APPs -  Physician Assistants and Nurse Practitioners) who all work together to provide you with the care you need, when you need it.   Your next appointment:   6 month(s)  Provider:   Elder Negus, MD     Other Instructions   1st Floor: - Lobby - Registration  - Pharmacy  - Lab - Cafe  2nd Floor: - PV Lab - Diagnostic Testing (echo, CT, nuclear med)  3rd Floor: - Vacant  4th Floor: - TCTS (cardiothoracic surgery) - AFib Clinic - Structural Heart Clinic - Vascular Surgery  - Vascular Ultrasound  5th Floor: - HeartCare Cardiology (general and EP) - Clinical Pharmacy for coumadin, hypertension, lipid, weight-loss medications, and med management appointments    Valet parking services will be available as well.

## 2023-04-20 ENCOUNTER — Other Ambulatory Visit: Payer: Self-pay | Admitting: *Deleted

## 2023-04-20 DIAGNOSIS — E871 Hypo-osmolality and hyponatremia: Secondary | ICD-10-CM

## 2023-04-20 DIAGNOSIS — I25810 Atherosclerosis of coronary artery bypass graft(s) without angina pectoris: Secondary | ICD-10-CM

## 2023-04-20 DIAGNOSIS — E861 Hypovolemia: Secondary | ICD-10-CM

## 2023-04-20 DIAGNOSIS — I5020 Unspecified systolic (congestive) heart failure: Secondary | ICD-10-CM

## 2023-04-20 DIAGNOSIS — I2510 Atherosclerotic heart disease of native coronary artery without angina pectoris: Secondary | ICD-10-CM

## 2023-04-20 DIAGNOSIS — D696 Thrombocytopenia, unspecified: Secondary | ICD-10-CM | POA: Diagnosis not present

## 2023-04-20 DIAGNOSIS — I491 Atrial premature depolarization: Secondary | ICD-10-CM | POA: Diagnosis not present

## 2023-04-20 DIAGNOSIS — I25118 Atherosclerotic heart disease of native coronary artery with other forms of angina pectoris: Secondary | ICD-10-CM | POA: Diagnosis not present

## 2023-04-20 DIAGNOSIS — I38 Endocarditis, valve unspecified: Secondary | ICD-10-CM

## 2023-04-20 DIAGNOSIS — T826XXD Infection and inflammatory reaction due to cardiac valve prosthesis, subsequent encounter: Secondary | ICD-10-CM | POA: Diagnosis not present

## 2023-04-21 LAB — CBC WITH DIFFERENTIAL/PLATELET
Basophils Absolute: 0 10*3/uL (ref 0.0–0.2)
Basos: 1 %
EOS (ABSOLUTE): 0.1 10*3/uL (ref 0.0–0.4)
Eos: 2 %
Hematocrit: 33.2 % — ABNORMAL LOW (ref 37.5–51.0)
Hemoglobin: 11 g/dL — ABNORMAL LOW (ref 13.0–17.7)
Immature Grans (Abs): 0 10*3/uL (ref 0.0–0.1)
Immature Granulocytes: 0 %
Lymphocytes Absolute: 0.8 10*3/uL (ref 0.7–3.1)
Lymphs: 15 %
MCH: 32.6 pg (ref 26.6–33.0)
MCHC: 33.1 g/dL (ref 31.5–35.7)
MCV: 99 fL — ABNORMAL HIGH (ref 79–97)
Monocytes Absolute: 0.5 10*3/uL (ref 0.1–0.9)
Monocytes: 10 %
Neutrophils Absolute: 3.9 10*3/uL (ref 1.4–7.0)
Neutrophils: 72 %
Platelets: 172 10*3/uL (ref 150–450)
RBC: 3.37 x10E6/uL — ABNORMAL LOW (ref 4.14–5.80)
RDW: 11.6 % (ref 11.6–15.4)
WBC: 5.4 10*3/uL (ref 3.4–10.8)

## 2023-05-05 ENCOUNTER — Other Ambulatory Visit: Payer: Self-pay | Admitting: Family

## 2023-05-05 DIAGNOSIS — N529 Male erectile dysfunction, unspecified: Secondary | ICD-10-CM | POA: Diagnosis not present

## 2023-05-13 ENCOUNTER — Other Ambulatory Visit: Payer: Self-pay | Admitting: Cardiology

## 2023-05-15 ENCOUNTER — Other Ambulatory Visit: Payer: Self-pay | Admitting: Family

## 2023-05-15 DIAGNOSIS — N529 Male erectile dysfunction, unspecified: Secondary | ICD-10-CM

## 2023-05-15 NOTE — Progress Notes (Signed)
Urologist referral ordered.

## 2023-06-03 ENCOUNTER — Other Ambulatory Visit: Payer: Self-pay | Admitting: Family

## 2023-06-06 ENCOUNTER — Encounter: Payer: Self-pay | Admitting: Family

## 2023-06-06 ENCOUNTER — Other Ambulatory Visit: Payer: Self-pay | Admitting: Family

## 2023-06-06 ENCOUNTER — Ambulatory Visit (INDEPENDENT_AMBULATORY_CARE_PROVIDER_SITE_OTHER): Payer: Medicare PPO | Admitting: Family

## 2023-06-06 VITALS — BP 122/76 | HR 60 | Temp 97.6°F | Resp 19 | Ht 71.0 in | Wt 170.6 lb

## 2023-06-06 DIAGNOSIS — Z0000 Encounter for general adult medical examination without abnormal findings: Secondary | ICD-10-CM

## 2023-06-06 DIAGNOSIS — I5022 Chronic systolic (congestive) heart failure: Secondary | ICD-10-CM

## 2023-06-06 MED ORDER — TORSEMIDE 10 MG PO TABS: 10.0000 mg | ORAL_TABLET | Freq: Two times a day (BID) | ORAL | 3 refills | Status: DC

## 2023-06-06 NOTE — Progress Notes (Signed)
 Subjective:   [name] is a 83 y.o. male who presents for Medicare Annual/Subsequent preventive examination.  Visit Complete: In person  Patient Medicare AWV questionnaire was completed by the patient on 06/06/2023; I have confirmed that all information answered by patient is correct and no changes since this date.  Cardiac Risk Factors include: advanced age (>90men, >72 women);family history of premature cardiovascular disease;male gender;smoking/ tobacco exposure;hypertension;dyslipidemia     Objective:    Today's Vitals   06/06/23 0913  BP: 122/76  Pulse: 60  Resp: 19  Temp: 97.6 F (36.4 C)  SpO2: 98%  Weight: 170 lb 9.6 oz (77.4 kg)  Height: 5\' 11"  (1.803 m)   Body mass index is 23.79 kg/m.     06/06/2023    9:07 AM 04/06/2023    3:02 PM 03/24/2023   10:39 AM 03/24/2023    8:38 AM 03/21/2023    8:44 AM 01/25/2023    2:55 PM 11/16/2022   10:50 AM  Advanced Directives  Does Patient Have a Medical Advance Directive? Yes Yes Yes Yes Yes Yes Yes  Type of Estate agent of [place];Living will Healthcare Power of [place];Living will Healthcare Power of [place];Living will Healthcare Power of [place];Living will Healthcare Power of [place];Living will Healthcare Power of [place];Living will Healthcare Power of [place];Living will  Does patient want to make changes to medical advance directive? No - Patient declined No - Patient declined  No - Patient declined No - Patient declined No - Patient declined   Copy of Healthcare Power of Attorney in Chart? Yes - validated most recent copy scanned in chart (See row information) Yes - validated most recent copy scanned in chart (See row information) Yes - validated most recent copy scanned in chart (See row information) Yes - validated most recent copy scanned in chart (See row information) No - copy requested Yes - validated most recent copy scanned in chart (See row information) Yes - validated most recent  copy scanned in chart (See row information)  Would patient like information on creating a medical advance directive?       No - Patient declined    Current Medications (verified) Outpatient Encounter Medications as of 06/06/2023  Medication Sig   acetaminophen  (TYLENOL ) 500 MG tablet Take 2 tablets (1,000 mg total) by mouth every 6 (six) hours as needed for mild pain or fever.   ascorbic acid  (VITAMIN C ) 500 MG tablet TAKE 1 TABLET (500 MG TOTAL) BY MOUTH DAILY.   aspirin  EC 81 MG tablet Take 81 mg by mouth daily. Swallow whole.   atorvastatin  (LIPITOR) 80 MG tablet Take 1 tablet (80 mg total) by mouth daily.   azelastine (ASTELIN) 0.1 % nasal spray Place into both nostrils as needed for rhinitis. Use in each nostril as directed   cefadroxil  (DURICEF) 500 MG capsule Take 1 capsule (500 mg total) by mouth 2 (two) times daily.   cetirizine (ZYRTEC) 10 MG tablet Take 10 mg by mouth daily.   ferrous gluconate  (FERGON) 324 MG tablet TAKE 1 TABLET BY MOUTH EVERY DAY WITH BREAKFAST   metoprolol  succinate (TOPROL -XL) 25 MG 24 hr tablet Take 1 tablet (25 mg total) by mouth daily.   Multiple Vitamin (MULTIVITAMIN ADULT PO) Take 1 tablet by mouth daily.   MYRBETRIQ  25 MG TB24 tablet Take 25 mg by mouth daily.   Omega-3 Fatty Acids (FISH OIL) 1000 MG CAPS Take 2 capsules by mouth daily.   tamsulosin  (FLOMAX ) 0.4 MG CAPS capsule TAKE 1 CAPSULE BY  MOUTH EVERY DAY   torsemide  (DEMADEX ) 10 MG tablet TAKE 1 TABLET BY MOUTH EVERY DAY   No facility-administered encounter medications on file as of 06/06/2023.    Allergies (verified) Bactrim [sulfamethoxazole-trimethoprim]   History: 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   CHF (congestive heart failure) (HCC)    Elevated cholesterol    Gout    Hepatitis 1964   hepatitis A    HFrEF (heart failure with reduced ejection fraction) (HCC)    Hypertension    Moderate mitral regurgitation     Severe aortic stenosis    Past Surgical History:  Procedure Laterality Date   AORTIC VALVE REPLACEMENT N/A 07/24/2017   Procedure: AORTIC VALVE REPLACEMENT (AVR) using 23mm Inspiris Aortic Valve;  Surgeon: Bartley Lightning, MD;  Location: [facility] OR;  Service: Open Heart Surgery;  Laterality: N/A;   CLIPPING OF ATRIAL APPENDAGE N/A 07/24/2017   Procedure: CLIPPING OF ATRIAL APPENDAGE using a 50mm Atricure clip;  Surgeon: Bartley Lightning, MD;  Location: [facility] OR;  Service: Open Heart Surgery;  Laterality: N/A;   CORONARY ARTERY BYPASS GRAFT N/A 07/24/2017   Procedure: CORONARY ARTERY BYPASS GRAFTING (CABG) times 3 using the left greater saphenous vein harvested endoscopically and left internal mammary artery.;  Surgeon: Bartley Lightning, MD;  Location: [facility] OR;  Service: Open Heart Surgery;  Laterality: N/A;   EYE SURGERY Bilateral    lens replacements for cataracts   HERNIA REPAIR     INGUINAL HERNIA REPAIR Right 05/06/2015   Procedure: LAPAROSCOPIC RIGHT INGUINAL HERNIA WITH MESH;  Surgeon: Oza Blumenthal, MD;  Location: [facility] ORS;  Service: General;  Laterality: Right;   INSERTION OF MESH Right 05/06/2015   Procedure: INSERTION OF MESH;  Surgeon: Oza Blumenthal, MD;  Location: [facility] ORS;  Service: General;  Laterality: Right;   IR BONE MARROW BIOPSY & ASPIRATION  09/15/2022   RIGHT HEART CATH N/A 03/21/2023   Procedure: RIGHT HEART CATH;  Surgeon: Cody Das, MD;  Location: [facility] INVASIVE CV LAB;  Service: Cardiovascular;  Laterality: N/A;   RIGHT/LEFT HEART CATH AND CORONARY ANGIOGRAPHY N/A 07/10/2017   Procedure: RIGHT/LEFT HEART CATH AND CORONARY ANGIOGRAPHY;  Surgeon: Cody Das, MD;  Location: [facility] INVASIVE CV LAB;  Service: Cardiovascular;  Laterality: N/A;   TEE WITHOUT CARDIOVERSION N/A 07/11/2017   Procedure: TRANSESOPHAGEAL ECHOCARDIOGRAM (TEE);  Surgeon: Cody Das, MD;  [facility];  Service: Cardiovascular;  Laterality: N/A;   TEE WITHOUT CARDIOVERSION N/A 07/24/2017    Procedure: TRANSESOPHAGEAL ECHOCARDIOGRAM (TEE);  Surgeon: Bartley Lightning, MD;  Location: [facility] OR;  Service: Open Heart Surgery;  Laterality: N/A;   TEE WITHOUT CARDIOVERSION N/A 08/01/2022   Procedure: TRANSESOPHAGEAL ECHOCARDIOGRAM;  Surgeon: Lenise Quince, MD;  Location: [facility] INVASIVE CV LAB;  Service: Cardiovascular;  Laterality: N/A;   TOTAL KNEE ARTHROPLASTY Right 08/07/2019   Procedure: TOTAL KNEE ARTHROPLASTY;  Surgeon: Liliane Rei, MD;  Location: [facility] ORS;  Service: Orthopedics;  Laterality: Right;   ULTRASOUND GUIDANCE FOR VASCULAR ACCESS  07/10/2017   Procedure: Ultrasound Guidance For Vascular Access;  Surgeon: Cody Das, MD;  Location: [facility] INVASIVE CV LAB;  Service: Cardiovascular;;   Family History  Problem Relation Age of Onset   Stroke Mother    Heart attack Father    Social History   Socioeconomic History   Marital status: Widowed    Spouse name: Not on file   Number of  children: 1   Years of education: Not on file   Highest education level: Not on file  Occupational History   Not on file  Tobacco Use   Smoking status: Former    Current packs/day: 0.00    Average packs/day: 1 pack/day for 12.0 years (12.0 ttl pk-yrs)    Types: Cigarettes    Start date: 43    Quit date: 44    Years since quitting: 52.3   Smokeless tobacco: Never   Tobacco comments:    quit 30-40 yrs ago  Vaping Use   Vaping status: Never Used  Substance and Sexual Activity   Alcohol use: Yes    Comment: 1-2 drinks   Drug use: Yes    Types: Marijuana   Sexual activity: Not Currently  Other Topics Concern   Not on file  Social History Narrative   Not on file   Social Drivers of Health   Financial Resource Strain: Low Risk  (05/13/2022)   Overall Financial Resource Strain (CARDIA)    Difficulty of Paying Living Expenses: Not hard at all  Food Insecurity: No Food Insecurity (06/06/2023)   Hunger Vital Sign    Worried About Running Out of Food in the Last Year: Never true     Ran Out of Food in the Last Year: Never true  Transportation Needs: No Transportation Needs (06/06/2023)   PRAPARE - Administrator, Civil Service (Medical): No    Lack of Transportation (Non-Medical): No  Physical Activity: Sufficiently Active (05/13/2022)   Exercise Vital Sign    Days of Exercise per Week: 7 days    Minutes of Exercise per Session: 60 min  Stress: No Stress Concern Present (05/13/2022)   [employer] of Occupational Health - Occupational Stress Questionnaire    Feeling of Stress : Not at all  Social Connections: Socially Integrated (05/13/2022)   Social Connection and Isolation Panel [NHANES]    Frequency of Communication with Friends and Family: More than three times a week    Frequency of Social Gatherings with Friends and Family: More than three times a week    Attends Religious Services: More than 4 times per year    Active Member of [employer] or Organizations: Yes    Attends Engineer, structural: More than 4 times per year    Marital Status: Married    Tobacco Counseling Counseling given: Not Answered Tobacco comments: quit 30-40 yrs ago   Clinical Intake:  Pre-visit preparation completed: No  Pain : No/denies pain     BMI - recorded: 23.79 Nutritional Status: BMI of 19-24  Normal Nutritional Risks: None Diabetes: No  How often do you need to have someone help you when you read instructions, pamphlets, or other written materials from your doctor or pharmacy?: 1 - Never What is the last grade level you completed in school?: Masters degree in social work  Equities trader Needed?: No      Activities of Daily Living    06/06/2023    9:29 AM 09/13/2022    9:09 PM  In your present state of health, do you have any difficulty performing the following activities:  Hearing? 1 1  Vision? 0 0  Difficulty concentrating or making decisions? 0 0  Walking or climbing stairs? 0 0  Dressing or bathing? 0 0  Doing errands, shopping? 0 1   Preparing Food and eating ? N   Using the Toilet? N   In the past six months, have you accidently leaked urine?  Y   Do you have problems with loss of bowel control? N   Managing your Medications? N   Managing your Finances? N   Housekeeping or managing your Housekeeping? N     Patient Care Team: Etter Royall C, NP as PCP - General (Family Medicine) Cody Das, MD as PCP - Cardiology (Cardiology) Cardiovascular, [place]  Indicate any recent Medical Services you may have received from other than [facility] providers in the past year (date may be approximate).     Assessment:   This is a routine wellness examination for [name].  Hearing/Vision screen Hearing Screening - Comments:: No hearing issues new hearing aids. Vision Screening - Comments:: Eye exam 6 months ago pt wear glasses.(Eye images )   Goals Addressed             This Visit's Progress    Patient Stated   On track    Stay active and do own ADL's for the next 10 years        Depression Screen    06/06/2023    9:09 AM 01/25/2023    2:53 PM 11/15/2022    1:58 PM 10/31/2022   10:03 AM 10/26/2022   10:06 AM 09/22/2022    9:21 AM 09/21/2022    9:55 AM  PHQ 2/9 Scores  PHQ - 2 Score 0 0 0 0 0 0 0  PHQ- 9 Score       0    Fall Risk    06/06/2023    9:09 AM 03/24/2023   10:39 AM 01/25/2023    2:53 PM 01/25/2023   10:23 AM 11/15/2022    1:58 PM  Fall Risk   Falls in the past year? 0 0 1 0 0  Number falls in past yr: 0 0 0    Injury with Fall? 0 0 0    Risk for fall due to : No Fall Risks Impaired balance/gait  No Fall Risks   Follow up Falls evaluation completed Falls evaluation completed  Falls evaluation completed     MEDICARE RISK AT HOME: Medicare Risk at Home Any stairs in or around the home?: No If so, are there any without handrails?: No Home free of loose throw rugs in walkways, pet beds, electrical cords, etc?: No Adequate lighting in your home to reduce risk of falls?: No Life alert?:  No Use of a cane, walker or w/c?: No Grab bars in the bathroom?: Yes Shower chair or bench in shower?: Yes Elevated toilet seat or a handicapped toilet?: No  TIMED UP AND GO:  Was the test performed?  Yes  Length of time to ambulate 10 feet: 15 sec Gait slow and steady without use of assistive device    Cognitive Function:    06/03/2022   10:08 AM  MMSE - Mini Mental State Exam  Orientation to time 5  Orientation to Place 5  Registration 3  Attention/ Calculation 5  Recall 2  Language- name 2 objects 2  Language- repeat 1  Language- follow 3 step command 3  Language- read & follow direction 1  Write a sentence 1  Copy design 1  Total score 29        06/06/2023    9:11 AM  6CIT Screen  What Year? 0 points  What month? 0 points  What time? 0 points  Count back from 20 0 points  Months in reverse 0 points  Repeat phrase 0 points  Total Score 0 points    Immunizations  Immunization History  Administered Date(s) Administered   Fluad Quad(high Dose 65+) 01/18/2022   Influenza, High Dose Seasonal PF 11/24/2018, 10/19/2022   Influenza-Unspecified 11/24/2018   Moderna Covid-19 Fall Seasonal Vaccine 40yrs & older 04/11/2023   Moderna Sars-Covid-2 Vaccination 03/15/2019, 03/15/2019, 04/12/2019, 04/12/2019   PNEUMOCOCCAL CONJUGATE-20 01/18/2022   Tdap 02/11/2013, 01/18/2019, 02/08/2023   Zoster Recombinant(Shingrix) 02/26/2019, 05/20/2019    TDAP status: Up to date  Flu Vaccine status: Up to date  Pneumococcal vaccine status: Up to date  Covid-19 vaccine status: Information provided on how to obtain vaccines.   Qualifies for Shingles Vaccine? Yes   Zostavax completed No   Shingrix Completed?: Yes  Screening Tests Health Maintenance  Topic Date Due   INFLUENZA VACCINE  09/08/2023   COVID-19 Vaccine (6 - Moderna risk 2024-25 season) 10/12/2023   Medicare Annual Wellness (AWV)  06/05/2024   DTaP/Tdap/Td (4 - Td or Tdap) 02/07/2033   Pneumonia Vaccine 25+  Years old  Completed   Zoster Vaccines- Shingrix  Completed   HPV VACCINES  Aged Out   Meningococcal B Vaccine  Aged Out    Health Maintenance  There are no preventive care reminders to display for this patient.   Colorectal cancer screening: No longer required.   Lung Cancer Screening: (Low Dose CT Chest recommended if Age 35-80 years, 20 pack-year currently smoking OR have quit w/in 15years.) does not qualify.   Lung Cancer Screening Referral: No   Additional Screening:  Hepatitis C Screening: does not qualify; Completed N/A   Vision Screening: Recommended annual ophthalmology exams for early detection of glaucoma and other disorders of the eye. Is the patient up to date with their annual eye exam?  Yes  Who is the provider or what is the name of the office in which the patient attends annual eye exams? [facility]  If pt is not established with a provider, would they like to be referred to a provider to establish care? No .   Dental Screening: Recommended annual dental exams for proper oral hygiene  Diabetic Foot Exam: Diabetic Foot Exam: Completed N/A   Community Resource Referral / Chronic Care Management: CRR required this visit?  No   CCM required this visit?  No     Plan:     I have personally reviewed and noted the following in the patient's chart:   Medical and social history Use of alcohol, tobacco or illicit drugs  Current medications and supplements including opioid prescriptions. Patient is not currently taking opioid prescriptions. Functional ability and status Nutritional status Physical activity Advanced directives List of other physicians Hospitalizations, surgeries, and ER visits in previous 12 months Vitals Screenings to include cognitive, depression, and falls Referrals and appointments  In addition, I have reviewed and discussed with patient certain preventive protocols, quality metrics, and best practice recommendations. A written  personalized care plan for preventive services as well as general preventive health recommendations were provided to patient.     Estil Heman, NP   06/06/2023   After Visit Summary: (In Person-Printed) AVS printed and given to the patient  Nurse Notes: Up to date

## 2023-06-06 NOTE — Telephone Encounter (Signed)
Message routed to PCP Ngetich, Dinah C, NP  

## 2023-06-06 NOTE — Patient Instructions (Signed)
 [name] , Thank you for taking time to come for your Medicare Wellness Visit. I appreciate your ongoing commitment to your health goals. Please review the following plan we discussed and let me know if I can assist you in the future.   Screening recommendations/referrals: Colonoscopy : N/A  Recommended yearly ophthalmology/optometry visit for glaucoma screening and checkup Recommended yearly dental visit for hygiene and checkup  Vaccinations: Influenza vaccine due annually in September/October Pneumococcal vaccine : Up to date  Tdap vaccine : Up to date  Shingles vaccine : Up to date     Advanced directives: Yes   Conditions/risks identified: advanced age (>22men, >36 women);family history of premature cardiovascular disease;male gender;smoking/ tobacco exposure;hypertension;dyslipidemia  Next appointment: 1 year   Preventive Care 14 Years and Older, Male Preventive care refers to lifestyle choices and visits with your health care provider that can promote health and wellness. What does preventive care include? A yearly physical exam. This is also called an annual well check. Dental exams once or twice a year. Routine eye exams. Ask your health care provider how often you should have your eyes checked. Personal lifestyle choices, including: Daily care of your teeth and gums. Regular physical activity. Eating a healthy diet. Avoiding tobacco and drug use. Limiting alcohol use. Practicing safe sex. Taking low doses of aspirin  every day. Taking vitamin and mineral supplements as recommended by your health care provider. What happens during an annual well check? The services and screenings done by your health care provider during your annual well check will depend on your age, overall health, lifestyle risk factors, and family history of disease. Counseling  Your health care provider may ask you questions about your: Alcohol use. Tobacco use. Drug use. Emotional  well-being. Home and relationship well-being. Sexual activity. Eating habits. History of falls. Memory and ability to understand (cognition). Work and work Astronomer. Screening  You may have the following tests or measurements: Height, weight, and BMI. Blood pressure. Lipid and cholesterol levels. These may be checked every 5 years, or more frequently if you are over 8 years old. Skin check. Lung cancer screening. You may have this screening every year starting at age 73 if you have a 30-pack-year history of smoking and currently smoke or have quit within the past 15 years. Fecal occult blood test (FOBT) of the stool. You may have this test every year starting at age 81. Flexible sigmoidoscopy or colonoscopy. You may have a sigmoidoscopy every 5 years or a colonoscopy every 10 years starting at age 100. Prostate cancer screening. Recommendations will vary depending on your family history and other risks. Hepatitis C blood test. Hepatitis B blood test. Sexually transmitted disease (STD) testing. Diabetes screening. This is done by checking your blood sugar (glucose) after you have not eaten for a while (fasting). You may have this done every 1-3 years. Abdominal aortic aneurysm (AAA) screening. You may need this if you are a current or former smoker. Osteoporosis. You may be screened starting at age 54 if you are at high risk. Talk with your health care provider about your test results, treatment options, and if necessary, the need for more tests. Vaccines  Your health care provider may recommend certain vaccines, such as: Influenza vaccine. This is recommended every year. Tetanus, diphtheria, and acellular pertussis (Tdap, Td) vaccine. You may need a Td booster every 10 years. Zoster vaccine. You may need this after age 26. Pneumococcal 13-valent conjugate (PCV13) vaccine. One dose is recommended after age 68. Pneumococcal polysaccharide (PPSV23)  vaccine. One dose is recommended after  age 20. Talk to your health care provider about which screenings and vaccines you need and how often you need them. This information is not intended to replace advice given to you by your health care provider. Make sure you discuss any questions you have with your health care provider. Document Released: 02/20/2015 Document Revised: 10/14/2015 Document Reviewed: 11/25/2014 Elsevier Interactive Patient Education  2017 [employer].  Fall Prevention in the Home Falls can cause injuries. They can happen to people of all ages. There are many things you can do to make your home safe and to help prevent falls. What can I do on the outside of my home? Regularly fix the edges of walkways and driveways and fix any cracks. Remove anything that might make you trip as you walk through a door, such as a raised step or threshold. Trim any bushes or trees on the path to your home. Use bright outdoor lighting. Clear any walking paths of anything that might make someone trip, such as rocks or tools. Regularly check to see if handrails are loose or broken. Make sure that both sides of any steps have handrails. Any raised decks and porches should have guardrails on the edges. Have any leaves, snow, or ice cleared regularly. Use sand or salt on walking paths during winter. Clean up any spills in your garage right away. This includes oil or grease spills. What can I do in the bathroom? Use night lights. Install grab bars by the toilet and in the tub and shower. Do not use towel bars as grab bars. Use non-skid mats or decals in the tub or shower. If you need to sit down in the shower, use a plastic, non-slip stool. Keep the floor dry. Clean up any water  that spills on the floor as soon as it happens. Remove soap buildup in the tub or shower regularly. Attach bath mats securely with double-sided non-slip rug tape. Do not have throw rugs and other things on the floor that can make you trip. What can I do in the  bedroom? Use night lights. Make sure that you have a light by your bed that is easy to reach. Do not use any sheets or blankets that are too big for your bed. They should not hang down onto the floor. Have a firm chair that has side arms. You can use this for support while you get dressed. Do not have throw rugs and other things on the floor that can make you trip. What can I do in the kitchen? Clean up any spills right away. Avoid walking on wet floors. Keep items that you use a lot in easy-to-reach places. If you need to reach something above you, use a strong step stool that has a grab bar. Keep electrical cords out of the way. Do not use floor polish or wax that makes floors slippery. If you must use wax, use non-skid floor wax. Do not have throw rugs and other things on the floor that can make you trip. What can I do with my stairs? Do not leave any items on the stairs. Make sure that there are handrails on both sides of the stairs and use them. Fix handrails that are broken or loose. Make sure that handrails are as long as the stairways. Check any carpeting to make sure that it is firmly attached to the stairs. Fix any carpet that is loose or worn. Avoid having throw rugs at the top or bottom  of the stairs. If you do have throw rugs, attach them to the floor with carpet tape. Make sure that you have a light switch at the top of the stairs and the bottom of the stairs. If you do not have them, ask someone to add them for you. What else can I do to help prevent falls? Wear shoes that: Do not have high heels. Have rubber bottoms. Are comfortable and fit you well. Are closed at the toe. Do not wear sandals. If you use a stepladder: Make sure that it is fully opened. Do not climb a closed stepladder. Make sure that both sides of the stepladder are locked into place. Ask someone to hold it for you, if possible. Clearly mark and make sure that you can see: Any grab bars or  handrails. First and last steps. Where the edge of each step is. Use tools that help you move around (mobility aids) if they are needed. These include: Canes. Walkers. Scooters. Crutches. Turn on the lights when you go into a dark area. Replace any light bulbs as soon as they burn out. Set up your furniture so you have a clear path. Avoid moving your furniture around. If any of your floors are uneven, fix them. If there are any pets around you, be aware of where they are. Review your medicines with your doctor. Some medicines can make you feel dizzy. This can increase your chance of falling. Ask your doctor what other things that you can do to help prevent falls. This information is not intended to replace advice given to you by your health care provider. Make sure you discuss any questions you have with your health care provider. Document Released: 11/20/2008 Document Revised: 07/02/2015 Document Reviewed: 02/28/2014 Elsevier Interactive Patient Education  2017 [employer].

## 2023-07-12 DIAGNOSIS — N529 Male erectile dysfunction, unspecified: Secondary | ICD-10-CM | POA: Diagnosis not present

## 2023-08-08 ENCOUNTER — Ambulatory Visit: Payer: Medicare PPO | Admitting: Internal Medicine

## 2023-08-09 ENCOUNTER — Ambulatory Visit: Admitting: Internal Medicine

## 2023-08-17 ENCOUNTER — Ambulatory Visit: Admitting: Internal Medicine

## 2023-08-21 NOTE — Progress Notes (Unsigned)
 Subjective:  Chief complaint: follow-up for Prosthetic valve endocarditis    Patient ID: [name], male    DOB: Apr 21, 1940, 83 y.o.   MRN: [id number]  HPI  Discussed the use of AI scribe software for clinical note transcription with the patient, who gave verbal consent to proceed.  History of Present Illness   [name] is an 83 year old male with a history of artificial heart valve infection who presents for follow-up regarding long-term antibiotic use.  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 Surgical intervention was considered but deemed too risky, leading to treatment with intravenous antibiotics and maintenance on oral antibiotics. He is currently on cefadroxil  after experiencing issues with amoxicillin  including leukopenia. He is concerned about long term antibiotic use and risk but certainly if he has a nidus of uncontrolled infection that he should be on.       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   CHF (congestive heart failure) (HCC)    Elevated cholesterol    Gout    Hepatitis 1964   hepatitis A    HFrEF (heart failure with reduced ejection fraction) (HCC)    Hypertension    Moderate mitral regurgitation    Severe aortic stenosis     Past Surgical History:  Procedure Laterality Date   AORTIC VALVE REPLACEMENT N/A 07/24/2017   Procedure: AORTIC VALVE REPLACEMENT (AVR) using 23mm Inspiris Aortic Valve;  Surgeon: Lucas Dorise POUR, MD;  Location: [facility] OR;  Service: Open Heart Surgery;  Laterality: N/A;   CLIPPING OF ATRIAL APPENDAGE N/A 07/24/2017   Procedure: CLIPPING OF ATRIAL APPENDAGE using a 50mm Atricure clip;  Surgeon: Lucas Dorise POUR, MD;  Location: [facility] OR;  Service: Open Heart Surgery;  Laterality: N/A;   CORONARY ARTERY BYPASS GRAFT N/A 07/24/2017   Procedure: CORONARY ARTERY BYPASS GRAFTING (CABG) times 3 using the left greater  saphenous vein harvested endoscopically and left internal mammary artery.;  Surgeon: Lucas Dorise POUR, MD;  Location: [facility] OR;  Service: Open Heart Surgery;  Laterality: N/A;   EYE SURGERY Bilateral    lens replacements for cataracts   HERNIA REPAIR     INGUINAL HERNIA REPAIR Right 05/06/2015   Procedure: LAPAROSCOPIC RIGHT INGUINAL HERNIA WITH MESH;  Surgeon: Vicenta Poli, MD;  Location: [facility] ORS;  Service: General;  Laterality: Right;   INSERTION OF MESH Right 05/06/2015   Procedure: INSERTION OF MESH;  Surgeon: Vicenta Poli, MD;  Location: [facility] ORS;  Service: General;  Laterality: Right;   IR BONE MARROW BIOPSY & ASPIRATION  09/15/2022   RIGHT HEART CATH N/A 03/21/2023   Procedure: RIGHT HEART CATH;  Surgeon: Elmira Newman PARAS, MD;  Location: [facility] INVASIVE CV LAB;  Service: Cardiovascular;  Laterality: N/A;   RIGHT/LEFT HEART CATH AND CORONARY ANGIOGRAPHY N/A 07/10/2017   Procedure: RIGHT/LEFT HEART CATH AND CORONARY ANGIOGRAPHY;  Surgeon: Elmira Newman PARAS, MD;  Location: [facility] INVASIVE CV LAB;  Service: Cardiovascular;  Laterality: N/A;   TEE WITHOUT CARDIOVERSION N/A 07/11/2017   Procedure: TRANSESOPHAGEAL ECHOCARDIOGRAM (TEE);  Surgeon: Elmira Newman PARAS, MD;  Location[facility] ENDOSCOPY;  Service: Cardiovascular;  Laterality: N/A;   TEE WITHOUT CARDIOVERSION N/A 07/24/2017   Procedure: TRANSESOPHAGEAL ECHOCARDIOGRAM (TEE);  Surgeon: Lucas Dorise POUR, MD;  Location: [facility] OR;  Service: Open Heart Surgery;  Laterality: N/A;   TEE WITHOUT CARDIOVERSION N/A 08/01/2022   Procedure: TRANSESOPHAGEAL ECHOCARDIOGRAM;  Surgeon: Pietro Redell RAMAN, MD;  Location: [facility] INVASIVE CV LAB;  Service: Cardiovascular;  Laterality: N/A;   TOTAL KNEE ARTHROPLASTY Right 08/07/2019   Procedure: TOTAL KNEE ARTHROPLASTY;  Surgeon: Melodi Lerner, MD;  Location: [facility] ORS;  Service: Orthopedics;  Laterality: Right;   ULTRASOUND GUIDANCE FOR VASCULAR ACCESS  07/10/2017   Procedure: Ultrasound Guidance For Vascular Access;  Surgeon:  Elmira Newman PARAS, MD;  Location: [facility] INVASIVE CV LAB;  Service: Cardiovascular;;    Family History  Problem Relation Age of Onset   Stroke Mother    Heart attack Father       Social History   Socioeconomic History   Marital status: Widowed    Spouse name: Not on file   Number of children: 1   Years of education: Not on file   Highest education level: Not on file  Occupational History   Not on file  Tobacco Use   Smoking status: Former    Current packs/day: 0.00    Average packs/day: 1 pack/day for 12.0 years (12.0 ttl pk-yrs)    Types: Cigarettes    Start date: 69    Quit date: 60    Years since quitting: 52.5   Smokeless tobacco: Never   Tobacco comments:    quit 30-40 yrs ago  Vaping Use   Vaping status: Never Used  Substance and Sexual Activity   Alcohol use: Yes    Comment: 1-2 drinks   Drug use: Yes    Types: Marijuana   Sexual activity: Not Currently  Other Topics Concern   Not on file  Social History Narrative   Not on file   Social Drivers of Health   Financial Resource Strain: Low Risk  (05/13/2022)   Overall Financial Resource Strain (CARDIA)    Difficulty of Paying Living Expenses: Not hard at all  Food Insecurity: No Food Insecurity (06/06/2023)   Hunger Vital Sign    Worried About Running Out of Food in the Last Year: Never true    Ran Out of Food in the Last Year: Never true  Transportation Needs: No Transportation Needs (06/06/2023)   PRAPARE - Administrator, Civil Service (Medical): No    Lack of Transportation (Non-Medical): No  Physical Activity: Sufficiently Active (05/13/2022)   Exercise Vital Sign    Days of Exercise per Week: 7 days    Minutes of Exercise per Session: 60 min  Stress: No Stress Concern Present (05/13/2022)   [employer] of Occupational Health - Occupational Stress Questionnaire    Feeling of Stress : Not at all  Social Connections: Socially Integrated (05/13/2022)   Social Connection and Isolation  Panel    Frequency of Communication with Friends and Family: More than three times a week    Frequency of Social Gatherings with Friends and Family: More than three times a week    Attends Religious Services: More than 4 times per year    Active Member of [employer] or Organizations: Yes    Attends Engineer, structural: More than 4 times per year    Marital Status: Married    Allergies  Allergen Reactions   Bactrim [Sulfamethoxazole-Trimethoprim] Rash     Current Outpatient Medications:    acetaminophen  (TYLENOL ) 500 MG tablet, Take 2 tablets (1,000 mg total) by mouth every 6 (six) hours as needed for mild pain or fever., Disp: 30 tablet, Rfl: 0   ascorbic acid  (VITAMIN C ) 500 MG tablet, TAKE 1 TABLET (500 MG TOTAL) BY MOUTH DAILY., Disp:  90 tablet, Rfl: 3   aspirin  EC 81 MG tablet, Take 81 mg by mouth daily. Swallow whole., Disp: , Rfl:    atorvastatin  (LIPITOR) 80 MG tablet, Take 1 tablet (80 mg total) by mouth daily., Disp: 90 tablet, Rfl: 2   azelastine (ASTELIN) 0.1 % nasal spray, Place into both nostrils as needed for rhinitis. Use in each nostril as directed, Disp: , Rfl:    cefadroxil  (DURICEF) 500 MG capsule, Take 1 capsule (500 mg total) by mouth 2 (two) times daily., Disp: 60 capsule, Rfl: 11   cetirizine (ZYRTEC) 10 MG tablet, Take 10 mg by mouth daily., Disp: , Rfl:    ferrous gluconate  (FERGON) 324 MG tablet, TAKE 1 TABLET BY MOUTH EVERY DAY WITH BREAKFAST, Disp: 90 tablet, Rfl: 3   metoprolol  succinate (TOPROL -XL) 25 MG 24 hr tablet, Take 1 tablet (25 mg total) by mouth daily., Disp: 90 tablet, Rfl: 3   Multiple Vitamin (MULTIVITAMIN ADULT PO), Take 1 tablet by mouth daily., Disp: , Rfl:    MYRBETRIQ  25 MG TB24 tablet, Take 25 mg by mouth daily., Disp: , Rfl:    Omega-3 Fatty Acids (FISH OIL) 1000 MG CAPS, Take 2 capsules by mouth daily., Disp: , Rfl:    tamsulosin  (FLOMAX ) 0.4 MG CAPS capsule, TAKE 1 CAPSULE BY MOUTH EVERY DAY, Disp: 90 capsule, Rfl: 1   torsemide   (DEMADEX ) 10 MG tablet, Take 1 tablet (10 mg total) by mouth 2 (two) times daily., Disp: 60 tablet, Rfl: 3   Review of Systems  Constitutional:  Negative for activity change, appetite change, chills, diaphoresis, fatigue, fever and unexpected weight change.  HENT:  Negative for congestion, rhinorrhea, sinus pressure, sneezing, sore throat and trouble swallowing.   Eyes:  Negative for photophobia and visual disturbance.  Respiratory:  Negative for cough, chest tightness, shortness of breath, wheezing and stridor.   Cardiovascular:  Negative for chest pain, palpitations and leg swelling.  Gastrointestinal:  Negative for abdominal distention, abdominal pain, anal bleeding, blood in stool, constipation, diarrhea, nausea and vomiting.  Genitourinary:  Negative for difficulty urinating, dysuria, flank pain and hematuria.  Musculoskeletal:  Negative for arthralgias, back pain, gait problem, joint swelling and myalgias.  Skin:  Negative for color change, pallor, rash and wound.  Neurological:  Negative for dizziness, tremors, weakness and light-headedness.  Hematological:  Negative for adenopathy. Does not bruise/bleed easily.  Psychiatric/Behavioral:  Negative for agitation, behavioral problems, confusion, decreased concentration, dysphoric mood and sleep disturbance.        Objective:   Physical Exam Constitutional:      Appearance: He is well-developed.  HENT:     Head: Normocephalic and atraumatic.  Eyes:     Conjunctiva/sclera: Conjunctivae normal.  Cardiovascular:     Rate and Rhythm: Normal rate and regular rhythm.  Pulmonary:     Effort: Pulmonary effort is normal. No respiratory distress.     Breath sounds: No wheezing.  Abdominal:     General: There is no distension.     Palpations: Abdomen is soft.  Musculoskeletal:        General: No tenderness. Normal range of motion.     Cervical back: Normal range of motion and neck supple.  Skin:    General: Skin is warm and dry.      Coloration: Skin is not pale.     Findings: No erythema or rash.  Neurological:     General: No focal deficit present.     Mental Status: He is alert and oriented to person,  place, and time.  Psychiatric:        Mood and Affect: Mood normal.        Behavior: Behavior normal.        Thought Content: Thought content normal.        Judgment: Judgment normal.           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 Persistent infection requires ongoing antibiotics to prevent complications. Cefadroxil  minimizes resistance concerns but may lead to resistance in other infections. No C. Diff history, and cefadroxil  is low-risk for this. I will say that may partner Dr. Dea was involved in this decision with Dr. Ygnacio and not me so I will reach out to Redell and Annalee re their original plan - Continue cefadroxil  as prescribed. - Order safety labs  .      I have personally spent 26 minutes involved in face-to-face and non-face-to-face activities for this patient on the day of the visit. Professional time spent includes the following activities: Preparing to see the patient (review of tests), Obtaining and/or reviewing separately obtained history (admission/discharge record), Performing a medically appropriate examination and/or evaluation , Ordering medications/tests/procedures, referring and communicating with other health care professionals, Documenting clinical information in the EMR, Independently interpreting results (not separately reported), Communicating results to the patient/family/caregiver, Counseling and educating the patient/family/caregiver and Care coordination (not separately reported).

## 2023-08-22 ENCOUNTER — Ambulatory Visit: Admitting: Infectious Disease

## 2023-08-22 ENCOUNTER — Other Ambulatory Visit: Payer: Self-pay

## 2023-08-22 ENCOUNTER — Encounter: Payer: Self-pay | Admitting: Infectious Disease

## 2023-08-22 VITALS — BP 131/66 | HR 67 | Temp 97.5°F | Ht 71.0 in | Wt 168.0 lb

## 2023-08-22 DIAGNOSIS — I358 Other nonrheumatic aortic valve disorders: Secondary | ICD-10-CM

## 2023-08-22 DIAGNOSIS — B955 Unspecified streptococcus as the cause of diseases classified elsewhere: Secondary | ICD-10-CM | POA: Diagnosis not present

## 2023-08-22 DIAGNOSIS — I330 Acute and subacute infective endocarditis: Secondary | ICD-10-CM

## 2023-08-22 DIAGNOSIS — T826XXA Infection and inflammatory reaction due to cardiac valve prosthesis, initial encounter: Secondary | ICD-10-CM | POA: Diagnosis not present

## 2023-08-22 MED ORDER — CEFADROXIL 500 MG PO CAPS: 500.0000 mg | ORAL_CAPSULE | Freq: Two times a day (BID) | ORAL | 11 refills | Status: DC

## 2023-08-23 LAB — SEDIMENTATION RATE: Sed Rate: 2 mm/h (ref 0–20)

## 2023-08-23 LAB — BASIC METABOLIC PANEL WITHOUT GFR
BUN: 17 mg/dL (ref 7–25)
CO2: 29 mmol/L (ref 20–32)
Calcium: 9.8 mg/dL (ref 8.6–10.3)
Chloride: 104 mmol/L (ref 98–110)
Creat: 0.98 mg/dL (ref 0.70–1.22)
Glucose, Bld: 104 mg/dL — ABNORMAL HIGH (ref 65–99)
Potassium: 5 mmol/L (ref 3.5–5.3)
Sodium: 140 mmol/L (ref 135–146)

## 2023-08-23 LAB — CBC WITH DIFFERENTIAL/PLATELET
Absolute Lymphocytes: 764 {cells}/uL — ABNORMAL LOW (ref 850–3900)
Absolute Monocytes: 504 {cells}/uL (ref 200–950)
Basophils Absolute: 42 {cells}/uL (ref 0–200)
Basophils Relative: 1 %
Eosinophils Absolute: 139 {cells}/uL (ref 15–500)
Eosinophils Relative: 3.3 %
HCT: 38.1 % — ABNORMAL LOW (ref 38.5–50.0)
Hemoglobin: 12.2 g/dL — ABNORMAL LOW (ref 13.2–17.1)
MCH: 32.6 pg (ref 27.0–33.0)
MCHC: 32 g/dL (ref 32.0–36.0)
MCV: 101.9 fL — ABNORMAL HIGH (ref 80.0–100.0)
MPV: 10.4 fL (ref 7.5–12.5)
Monocytes Relative: 12 %
Neutro Abs: 2751 {cells}/uL (ref 1500–7800)
Neutrophils Relative %: 65.5 %
Platelets: 152 Thousand/uL (ref 140–400)
RBC: 3.74 Million/uL — ABNORMAL LOW (ref 4.20–5.80)
RDW: 11.6 % (ref 11.0–15.0)
Total Lymphocyte: 18.2 %
WBC: 4.2 Thousand/uL (ref 3.8–10.8)

## 2023-08-23 LAB — C-REACTIVE PROTEIN: CRP: <3 mg/L (ref <8.0)

## 2023-09-01 ENCOUNTER — Other Ambulatory Visit: Payer: Self-pay | Admitting: Family

## 2023-09-01 DIAGNOSIS — I5022 Chronic systolic (congestive) heart failure: Secondary | ICD-10-CM

## 2023-09-07 NOTE — Telephone Encounter (Signed)
 Noted

## 2023-09-22 ENCOUNTER — Ambulatory Visit: Payer: Medicare PPO | Admitting: Family

## 2023-09-22 ENCOUNTER — Encounter: Payer: Self-pay | Admitting: Family

## 2023-09-22 VITALS — BP 126/74 | HR 60 | Temp 97.6°F | Resp 18 | Ht 71.0 in | Wt 168.6 lb

## 2023-09-22 DIAGNOSIS — I25810 Atherosclerosis of coronary artery bypass graft(s) without angina pectoris: Secondary | ICD-10-CM

## 2023-09-22 DIAGNOSIS — I10 Essential (primary) hypertension: Secondary | ICD-10-CM

## 2023-09-22 DIAGNOSIS — E782 Mixed hyperlipidemia: Secondary | ICD-10-CM | POA: Diagnosis not present

## 2023-09-22 DIAGNOSIS — K5901 Slow transit constipation: Secondary | ICD-10-CM

## 2023-09-22 DIAGNOSIS — I5022 Chronic systolic (congestive) heart failure: Secondary | ICD-10-CM | POA: Diagnosis not present

## 2023-09-22 DIAGNOSIS — L989 Disorder of the skin and subcutaneous tissue, unspecified: Secondary | ICD-10-CM

## 2023-09-23 LAB — COMPREHENSIVE METABOLIC PANEL WITH GFR
AG Ratio: 2.6 (calc) — ABNORMAL HIGH (ref 1.0–2.5)
ALT: 16 U/L (ref 9–46)
AST: 21 U/L (ref 10–35)
Albumin: 4.5 g/dL (ref 3.6–5.1)
Alkaline phosphatase (APISO): 59 U/L (ref 35–144)
BUN: 16 mg/dL (ref 7–25)
CO2: 23 mmol/L (ref 20–32)
Calcium: 10 mg/dL (ref 8.6–10.3)
Chloride: 97 mmol/L — ABNORMAL LOW (ref 98–110)
Creat: 0.91 mg/dL (ref 0.70–1.22)
Globulin: 1.7 g/dL — ABNORMAL LOW (ref 1.9–3.7)
Glucose, Bld: 96 mg/dL (ref 65–99)
Potassium: 4.6 mmol/L (ref 3.5–5.3)
Sodium: 131 mmol/L — ABNORMAL LOW (ref 135–146)
Total Bilirubin: 0.8 mg/dL (ref 0.2–1.2)
Total Protein: 6.2 g/dL (ref 6.1–8.1)
eGFR: 84 mL/min/1.73m2 (ref >=60)

## 2023-09-23 LAB — LIPID PANEL
Cholesterol: 132 mg/dL (ref <200)
HDL: 56 mg/dL (ref >=40)
LDL Cholesterol (Calc): 63 mg/dL
Non-HDL Cholesterol (Calc): 76 mg/dL (ref <130)
Total CHOL/HDL Ratio: 2.4 (calc) (ref <5.0)
Triglycerides: 54 mg/dL (ref <150)

## 2023-09-23 LAB — TSH: TSH: 1.23 m[IU]/L (ref 0.40–4.50)

## 2023-09-26 ENCOUNTER — Other Ambulatory Visit: Payer: Self-pay | Admitting: Medical Genetics

## 2023-10-01 NOTE — Progress Notes (Signed)
 Provider: Roxan Plough FNP-C   Misa Fedorko, Roxan BROCKS, NP  Patient Care Team: Malissia Rabbani, Roxan BROCKS, NP as PCP - General (Family Medicine) Elmira Newman PARAS, MD as PCP - Cardiology (Cardiology) Cardiovascular, [facility]  Extended Emergency Contact Information Primary Emergency Contact: Cardy,Johnny Address: [address]          [place], [place] [address] [place] Home Phone: [phone] Mobile Phone: [phone] Relation: Son Secondary Emergency Contact: cole,sue Address: [address]          [place], [place] [address] [place] Phone: [phone] Relation: Friend  Code Status:  Full Code  Goals of care: Advanced Directive information    09/22/2023    8:59 AM  Advanced Directives  Does Patient Have a Medical Advance Directive? Yes  Type of Estate agent of [place];Living will  Does patient want to make changes to medical advance directive? No - Patient declined  Copy of Healthcare Power of Attorney in Chart? Yes - validated most recent copy scanned in chart (See row information)     Chief Complaint  Patient presents with   Medical Management of Chronic Issues    6 Month follow up    HPI:  Pt is a 83 y.o. male seen today for medical management of chronic diseases.   Discussed the use of AI scribe software for clinical note transcription with the patient, who gave verbal consent to proceed.  History of Present Illness   [name] is an 83 year old male with prediabetes and coronary artery disease who presents for a six-month follow-up.  He was diagnosed with prediabetes approximately two months ago. Since then, he has been actively reducing his sugar intake and exercising regularly. He has eliminated desserts from his diet and is interested in re-evaluating his glucose levels. Previous lab work showed a glucose level of 104 mg/dL, but he was not fasting at the time.  He has a history of coronary artery  disease and is currently taking a baby aspirin  daily. No chest pain is reported, and he seldom uses Tylenol  for pain. His current medications include a multivitamin, omega-3 fatty acids, Mobetric, vitamin C  500 mg, tamsulosin  0.4 mg daily, travastatin 80 mg for cholesterol, iron 324 mg daily, metoprolol  25 mg daily, and Astelin nasal spray. He is not currently taking Zyrtec for allergies but uses an over-the-counter nasal spray as needed. No issues with constipation from the iron supplement. He bruises easily and has no known allergies.  He has moved in with his mother-in-law and is eating almost completely organic, naturally sourced food. He remains physically active, riding his bicycle and playing disc golf regularly. He has also set up a home gym.  He has noticed some spots on his back, which his wife is concerned about. He is unsure how long they have been present and reports no associated symptoms such as itching. He has a history of significant sun exposure without sunscreen.  He experiences erectile dysfunction and has tried various treatments, including a pump and medications, without success. His erectile function declined after a prolonged hospital stay last summer, 2024. He has consulted with a urologist and received a second opinion, but treatments have not been effective.    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   CHF (congestive heart failure) (HCC)    Elevated cholesterol    Gout    Hepatitis 1964  hepatitis A    HFrEF (heart failure with reduced ejection fraction) (HCC)    Hypertension    Moderate mitral regurgitation    Pre-diabetes    Severe aortic stenosis    Past Surgical History:  Procedure Laterality Date   AORTIC VALVE REPLACEMENT N/A 07/24/2017   Procedure: AORTIC VALVE REPLACEMENT (AVR) using 23mm Inspiris Aortic Valve;  Surgeon: Lucas Dorise POUR, MD;  Location: [facility] OR;  Service: Open Heart Surgery;   Laterality: N/A;   CLIPPING OF ATRIAL APPENDAGE N/A 07/24/2017   Procedure: CLIPPING OF ATRIAL APPENDAGE using a 50mm Atricure clip;  Surgeon: Lucas Dorise POUR, MD;  Location: [facility] OR;  Service: Open Heart Surgery;  Laterality: N/A;   CORONARY ARTERY BYPASS GRAFT N/A 07/24/2017   Procedure: CORONARY ARTERY BYPASS GRAFTING (CABG) times 3 using the left greater saphenous vein harvested endoscopically and left internal mammary artery.;  Surgeon: Lucas Dorise POUR, MD;  Location: [facility] OR;  Service: Open Heart Surgery;  Laterality: N/A;   EYE SURGERY Bilateral    lens replacements for cataracts   HERNIA REPAIR     INGUINAL HERNIA REPAIR Right 05/06/2015   Procedure: LAPAROSCOPIC RIGHT INGUINAL HERNIA WITH MESH;  Surgeon: Vicenta Poli, MD;  Location: [facility] ORS;  Service: General;  Laterality: Right;   INSERTION OF MESH Right 05/06/2015   Procedure: INSERTION OF MESH;  Surgeon: Vicenta Poli, MD;  Location: [facility] ORS;  Service: General;  Laterality: Right;   IR BONE MARROW BIOPSY & ASPIRATION  09/15/2022   RIGHT HEART CATH N/A 03/21/2023   Procedure: RIGHT HEART CATH;  Surgeon: Elmira Newman PARAS, MD;  Location: [facility] INVASIVE CV LAB;  Service: Cardiovascular;  Laterality: N/A;   RIGHT/LEFT HEART CATH AND CORONARY ANGIOGRAPHY N/A 07/10/2017   Procedure: RIGHT/LEFT HEART CATH AND CORONARY ANGIOGRAPHY;  Surgeon: Elmira Newman PARAS, MD;  Location: [facility] INVASIVE CV LAB;  Service: Cardiovascular;  Laterality: N/A;   TEE WITHOUT CARDIOVERSION N/A 07/11/2017   Procedure: TRANSESOPHAGEAL ECHOCARDIOGRAM (TEE);  Surgeon: Elmira Newman PARAS, MD;  Location: [facility] ENDOSCOPY;  Service: Cardiovascular;  Laterality: N/A;   TEE WITHOUT CARDIOVERSION N/A 07/24/2017   Procedure: TRANSESOPHAGEAL ECHOCARDIOGRAM (TEE);  Surgeon: Lucas Dorise POUR, MD;  Location: [facility] OR;  Service: Open Heart Surgery;  Laterality: N/A;   TEE WITHOUT CARDIOVERSION N/A 08/01/2022   Procedure: TRANSESOPHAGEAL ECHOCARDIOGRAM;  Surgeon: Pietro Redell RAMAN, MD;  Location:  [facility] INVASIVE CV LAB;  Service: Cardiovascular;  Laterality: N/A;   TOTAL KNEE ARTHROPLASTY Right 08/07/2019   Procedure: TOTAL KNEE ARTHROPLASTY;  Surgeon: Melodi Lerner, MD;  Location: [facility] ORS;  Service: Orthopedics;  Laterality: Right;   ULTRASOUND GUIDANCE FOR VASCULAR ACCESS  07/10/2017   Procedure: Ultrasound Guidance For Vascular Access;  Surgeon: Elmira Newman PARAS, MD;  Location: [facility] INVASIVE CV LAB;  Service: Cardiovascular;;    Allergies  Allergen Reactions   Bactrim [Sulfamethoxazole-Trimethoprim] Rash    Allergies as of 09/22/2023       Reactions   Bactrim [sulfamethoxazole-trimethoprim] Rash        Medication List        Accurate as of September 22, 2023 11:59 PM. If you have any questions, ask your nurse or doctor.          acetaminophen  500 MG tablet Commonly known as: TYLENOL  Take 2 tablets (1,000 mg total) by mouth every 6 (six) hours as needed for mild pain or fever.   ascorbic acid  500 MG tablet Commonly known as: VITAMIN C  TAKE 1 TABLET (500 MG TOTAL) BY MOUTH DAILY.   aspirin  EC  81 MG tablet Take 81 mg by mouth daily. Swallow whole.   atorvastatin  80 MG tablet Commonly known as: LIPITOR Take 1 tablet (80 mg total) by mouth daily.   azelastine 0.1 % nasal spray Commonly known as: ASTELIN Place into both nostrils as needed for rhinitis. Use in each nostril as directed   cefadroxil  500 MG capsule Commonly known as: DURICEF Take 1 capsule (500 mg total) by mouth 2 (two) times daily.   cetirizine 10 MG tablet Commonly known as: ZYRTEC Take 10 mg by mouth daily.   ferrous gluconate  324 MG tablet Commonly known as: FERGON TAKE 1 TABLET BY MOUTH EVERY DAY WITH BREAKFAST   Fish Oil 1000 MG Caps Take 2 capsules by mouth daily.   metoprolol  succinate 25 MG 24 hr tablet Commonly known as: TOPROL -XL Take 1 tablet (25 mg total) by mouth daily.   MULTIVITAMIN ADULT PO Take 1 tablet by mouth daily.   Myrbetriq  25 MG Tb24 tablet Generic drug:  mirabegron  ER Take 25 mg by mouth daily.   tamsulosin  0.4 MG Caps capsule Commonly known as: FLOMAX  TAKE 1 CAPSULE BY MOUTH EVERY DAY   torsemide  10 MG tablet Commonly known as: DEMADEX  TAKE 1 TABLET BY MOUTH EVERY DAY        Review of Systems  Constitutional:  Negative for appetite change, chills, fatigue, fever and unexpected weight change.  HENT:  Negative for congestion, dental problem, ear discharge, ear pain, facial swelling, hearing loss, nosebleeds, postnasal drip, rhinorrhea, sinus pressure, sinus pain, sneezing, sore throat, tinnitus and trouble swallowing.   Eyes:  Negative for pain, discharge, redness, itching and visual disturbance.  Respiratory:  Negative for cough, chest tightness, shortness of breath and wheezing.   Cardiovascular:  Negative for chest pain, palpitations and leg swelling.  Gastrointestinal:  Negative for abdominal distention, abdominal pain, blood in stool, constipation, diarrhea, nausea and vomiting.  Endocrine: Negative for cold intolerance, heat intolerance, polydipsia, polyphagia and polyuria.  Genitourinary:  Negative for difficulty urinating, dysuria, flank pain, frequency and urgency.  Musculoskeletal:  Negative for arthralgias, back pain, gait problem, joint swelling, myalgias, neck pain and neck stiffness.  Skin:  Negative for color change, pallor, rash and wound.  Neurological:  Negative for dizziness, syncope, speech difficulty, weakness, light-headedness, numbness and headaches.  Hematological:  Does not bruise/bleed easily.  Psychiatric/Behavioral:  Negative for agitation, behavioral problems, confusion, hallucinations, self-injury, sleep disturbance and suicidal ideas. The patient is not nervous/anxious.     Immunization History  Administered Date(s) Administered   Fluad Quad(high Dose 65+) 01/18/2022   Influenza, High Dose Seasonal PF 11/24/2018, 10/19/2022   Influenza-Unspecified 11/24/2018   Moderna Covid-19 Fall Seasonal Vaccine  24yrs & older 04/11/2023   Moderna Sars-Covid-2 Vaccination 03/15/2019, 03/15/2019, 04/12/2019, 04/12/2019   PNEUMOCOCCAL CONJUGATE-20 01/18/2022   Tdap 02/11/2013, 01/18/2019, 02/08/2023   Zoster Recombinant(Shingrix) 02/26/2019, 05/20/2019   Pertinent  Health Maintenance Due  Topic Date Due   INFLUENZA VACCINE  05/07/2024 (Originally 09/08/2023)      01/25/2023    2:53 PM 03/24/2023   10:39 AM 06/06/2023    9:09 AM 08/22/2023   10:08 AM 09/22/2023    8:58 AM  Fall Risk  Falls in the past year? 1 0 0 0 0  Was there an injury with Fall? 0 0 0 0 0  Fall Risk Category Calculator 1 0 0 0 0  Patient at Risk for Falls Due to  Impaired balance/gait No Fall Risks No Fall Risks No Fall Risks  Fall risk Follow up  Falls evaluation completed Falls evaluation completed Falls evaluation completed Falls evaluation completed   Functional Status Survey:    Vitals:   09/22/23 0901  BP: 126/74  Pulse: 60  Resp: 18  Temp: 97.6 F (36.4 C)  SpO2: 97%  Weight: 168 lb 9.6 oz (76.5 kg)  Height: 5' 11 (1.803 m)   Body mass index is 23.51 kg/m. Physical Exam VITALS: T- 97.6, P- 60, BP- 126/74, SaO2- 97% MEASUREMENTS: Weight- 168.6. GENERAL: Alert, cooperative, well developed, no acute distress. HEENT: Normocephalic, normal oropharynx, moist mucous membranes, ears clean, no nasal swelling, no sinus tenderness. NECK: No cervical lymphadenopathy. CHEST: Clear to auscultation bilaterally, no wheezes, rhonchi, or crackles. CARDIOVASCULAR: Normal heart rate and rhythm, S1 and S2 normal without murmurs. ABDOMEN: Soft, non-tender, non-distended, without organomegaly, normal bowel sounds. EXTREMITIES: No cyanosis or edema. NEUROLOGICAL: Cranial nerves grossly intact, moves all extremities without gross motor or sensory deficit. SKIN: Multiple brownish spots on back, smooth, irregular borders.    PSYCHIATRY/BEHAVIORAL: Mood stable    Labs reviewed: Recent Labs    03/17/23 1458 03/21/23 1107  03/21/23 1108 08/22/23 1021 09/22/23 0942  NA 134   < > 139 140 131*  K 4.3   < > 3.8 5.0 4.6  CL 97  --   --  104 97*  CO2 23  --   --  29 23  GLUCOSE 99  --   --  104* 96  BUN 14  --   --  17 16  CREATININE 0.98  --   --  0.98 0.91  CALCIUM  8.9  --   --  9.8 10.0   < > = values in this interval not displayed.   Recent Labs    01/25/23 1039 09/22/23 0942  AST 19 21  ALT 15 16  BILITOT 0.7 0.8  PROT 6.8 6.2   Recent Labs    04/06/23 1423 04/20/23 1427 08/22/23 1021  WBC 5.1 5.4 4.2  NEUTROABS 3.5 3.9 2,751  HGB 11.9* 11.0* 12.2*  HCT 35.4* 33.2* 38.1*  MCV 97.8 99* 101.9*  PLT 130* 172 152   Lab Results  Component Value Date   TSH 1.23 09/22/2023   Lab Results  Component Value Date   HGBA1C 5.7 (H) 07/20/2017   Lab Results  Component Value Date   CHOL 132 09/22/2023   HDL 56 09/22/2023   LDLCALC 63 09/22/2023   LDLDIRECT 53 03/17/2023   TRIG 54 09/22/2023   CHOLHDL 2.4 09/22/2023    Significant Diagnostic Results in last 30 days:  No results found.  Assessment/Plan  Skin lesions of back, suspicious for malignancy Multiple brownish lesions on the back with irregular borders and smooth texture. - Refer to dermatologist for evaluation of skin lesions.  Prediabetes Previously diagnosed with prediabetes two months ago. Glucose level was 104 mg/dL on recent BMP, not fasting. He has reduced sugar intake and increased exercise, potentially improving glycemic control. - Order repeat blood glucose test. - Encourage continued reduction in sugar intake. - Encourage regular exercise.  Coronary artery disease No recent chest pain reported. He is taking baby aspirin  and metoprolol  for blood pressure management. - Continue baby aspirin . - Continue metoprolol .  Congestive heart failure No recent symptoms of swelling, shortness of breath, or cough. He is taking desonide. - Continue desonide.  Hyperlipidemia He is taking travastatin 80 mg for cholesterol  management. - Continue travastatin. - Order cholesterol test.  Anemia Mild anemia with hemoglobin improved from 11 g/dL to 87.7 g/dL. He is taking  iron supplements and reports no issues with constipation. - Continue iron supplementation. - Monitor hemoglobin levels.  Benign prostatic hyperplasia with lower urinary tract symptoms He is taking tamsulosin  0.4 mg daily. Continues follow-up with urologist annually. - Continue tamsulosin . - Continue annual urologist follow-up.  Erectile dysfunction Reports difficulty achieving erection despite using a pump and medications. Has consulted multiple urologists without success. Possible age-related decline. - Consult urologist for further options. - Consider dietary modifications including nuts.   Family/ staff Communication: Reviewed plan of care with patient verbalized understanding   Labs/tests ordered:  - CBC with Differential/Platelet - CMP with eGFR([facility]) - TSH - Lipid panel  Next Appointment : Return in about 6 months (around 03/24/2024) for medical mangement of chronic issues.SABRA   Spent 30 minutes of Face to face and non-face to face with patient  >50% time spent counseling; reviewing medical record; tests; labs; documentation and developing future plan of care.   Roxan JAYSON Plough, NP

## 2023-10-04 ENCOUNTER — Ambulatory Visit: Payer: Self-pay | Admitting: Family

## 2023-10-06 NOTE — Telephone Encounter (Signed)
Message routed to PCP Ngetich, Dinah C, NP  

## 2023-10-07 ENCOUNTER — Other Ambulatory Visit: Payer: Self-pay | Admitting: Family

## 2023-10-07 DIAGNOSIS — I5022 Chronic systolic (congestive) heart failure: Secondary | ICD-10-CM

## 2023-10-10 ENCOUNTER — Other Ambulatory Visit: Payer: Self-pay | Admitting: Family

## 2023-10-10 DIAGNOSIS — I5022 Chronic systolic (congestive) heart failure: Secondary | ICD-10-CM

## 2023-10-10 MED ORDER — TORSEMIDE 10 MG PO TABS: 10.0000 mg | ORAL_TABLET | Freq: Two times a day (BID) | ORAL | 1 refills | Status: DC

## 2023-10-10 NOTE — Telephone Encounter (Signed)
 Dose requested and active medication list differ

## 2023-10-10 NOTE — Progress Notes (Signed)
 Torsemide  10 mg tablet changed to twice daily as requested.

## 2023-10-11 ENCOUNTER — Inpatient Hospital Stay: Payer: Medicare PPO | Attending: Oncology

## 2023-10-11 ENCOUNTER — Inpatient Hospital Stay: Payer: Medicare PPO | Admitting: Oncology

## 2023-10-11 VITALS — BP 133/60 | HR 56 | Temp 97.6°F | Resp 18 | Ht 71.0 in | Wt 172.0 lb

## 2023-10-11 DIAGNOSIS — D649 Anemia, unspecified: Secondary | ICD-10-CM | POA: Insufficient documentation

## 2023-10-11 DIAGNOSIS — D72819 Decreased white blood cell count, unspecified: Secondary | ICD-10-CM

## 2023-10-11 DIAGNOSIS — D709 Neutropenia, unspecified: Secondary | ICD-10-CM | POA: Insufficient documentation

## 2023-10-11 LAB — CBC WITH DIFFERENTIAL (CANCER CENTER ONLY)
Abs Immature Granulocytes: 0.01 K/uL (ref 0.00–0.07)
Basophils Absolute: 0 K/uL (ref 0.0–0.1)
Basophils Relative: 1 %
Eosinophils Absolute: 0.1 K/uL (ref 0.0–0.5)
Eosinophils Relative: 3 %
HCT: 34.5 % — ABNORMAL LOW (ref 39.0–52.0)
Hemoglobin: 11.9 g/dL — ABNORMAL LOW (ref 13.0–17.0)
Immature Granulocytes: 0 %
Lymphocytes Relative: 17 %
Lymphs Abs: 0.6 K/uL — ABNORMAL LOW (ref 0.7–4.0)
MCH: 33.7 pg (ref 26.0–34.0)
MCHC: 34.5 g/dL (ref 30.0–36.0)
MCV: 97.7 fL (ref 80.0–100.0)
Monocytes Absolute: 0.4 K/uL (ref 0.1–1.0)
Monocytes Relative: 12 %
Neutro Abs: 2.4 K/uL (ref 1.7–7.7)
Neutrophils Relative %: 67 %
Platelet Count: 126 K/uL — ABNORMAL LOW (ref 150–400)
RBC: 3.53 MIL/uL — ABNORMAL LOW (ref 4.22–5.81)
RDW: 12.5 % (ref 11.5–15.5)
WBC Count: 3.7 K/uL — ABNORMAL LOW (ref 4.0–10.5)
nRBC: 0 % (ref 0.0–0.2)

## 2023-10-11 NOTE — Progress Notes (Signed)
   [facility] OFFICE PROGRESS NOTE   Diagnosis: Leukopenia, anemia  INTERVAL HISTORY:   Jesse Florendo returns as scheduled.  He feels well.  Good appetite and energy level.  He is exercising.  No fever or night sweats.  No recent infection.  No complaint.  Objective:  Vital signs in last 24 hours:  Blood pressure 133/60, pulse (!) 56, temperature 97.6 F (36.4 C), temperature source Temporal, resp. rate 18, height 5' 11 (1.803 m), weight 172 lb (78 kg), SpO2 100%.   Lymphatics: No cervical, supraclavicular, axillary, or inguinal nodes Resp: Coarse anticipatory rhonchi at the left posterior base, no respiratory distress Cardio: Irregular, 2/6 systolic murmur GI: No hepatosplenomegaly Vascular: No leg edema   Lab Results:  Lab Results  Component Value Date   WBC 3.7 (L) 10/11/2023   HGB 11.9 (L) 10/11/2023   HCT 34.5 (L) 10/11/2023   MCV 97.7 10/11/2023   PLT 126 (L) 10/11/2023   NEUTROABS 2.4 10/11/2023    CMP  Lab Results  Component Value Date   NA 131 (L) 09/22/2023   K 4.6 09/22/2023   CL 97 (L) 09/22/2023   CO2 23 09/22/2023   GLUCOSE 96 09/22/2023   BUN 16 09/22/2023   CREATININE 0.91 09/22/2023   CALCIUM  10.0 09/22/2023   PROT 6.2 09/22/2023   ALBUMIN  2.6 (L) 09/17/2022   AST 21 09/22/2023   ALT 16 09/22/2023   ALKPHOS 48 09/17/2022   BILITOT 0.8 09/22/2023   GFRNONAA >60 09/17/2022   GFRAA >60 08/08/2019    No results found for: CEA1, CEA, CAN199, CA125  Lab Results  Component Value Date   INR 1.2 07/28/2022   LABPROT 15.1 07/28/2022    Imaging:  No results found.  Medications: I have reviewed the patient's current medications.   Assessment/Plan:   Severe anemia/leukopenia with severe neutropenia Bone marrow biopsy 09/15/2022-hypercellular bone marrow for age with granulocytic hyperplasia; significant dyspoiesis or increase in blastic cells not identified; overall changes likely secondary/regenerative in nature; flow  cytometry with no significant CD34 positive blastic population and no monoclonal B-cell population or significant T-cell abnormalities; cytogenetics normal. 2.  History of vitamin B12 deficiency 3.  Hyponatremia 4.  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  History of CAD 6.  CHF 7.  Gout 8.  Indeterminate posterior right liver and renal lesions on CT 07/28/2022.  MRI liver 10/13/2022-unchanged essentially nonenhancing lesion of the posterior right lobe of the liver, hepatic segment 7 with overlying capsular retraction and heterogeneous internal fluid signal measuring approximately 3.1 x 2.0 cm, favored benign.  Benign fluid signal renal cortical cyst for which no further follow-up for characterization is required.  Kidneys otherwise normal.     Disposition: [name] appears well.  He is stable from a hematologic standpoint.  He has some mild pancytopenia.  The etiology of the severe neutropenia last year remains unclear.  He has a history of vitamin B12 deficiency and is no longer taking vitamin B12.  He will resume oral vitamin B12.  Jesse. Mcphatter will remain up-to-date on influenza and pneumonia vaccines.  He will return for an office and lab visit in 1 year.  He will call in the interim for new symptoms.  Arley Hof, MD  10/11/2023  10:05 AM

## 2023-10-11 NOTE — Patient Instructions (Signed)
 Vitamin B12 Capsules or Tablets--start 1000 mcg daily. This is OTC What is this medication? VITAMIN B12 (VAHY tuh min B12) prevents and treats low vitamin B12 levels in your body. It is used in people who do not get enough vitamin B12 from their diet or when their digestive tract does not absorb enough. Vitamin B12 plays an important role in maintaining the health of your nervous system and red blood cells. This medicine may be used for other purposes; ask your health care provider or pharmacist if you have questions. COMMON BRAND NAME(S): True Vitamin B12 What should I tell my care team before I take this medication? They need to know if you have any of these conditions: Anemia Kidney disease Leber's disease Malabsorption disorder An unusual or allergic reaction to cyanocobalamin , cobalt, other medications, foods, dyes, or preservatives Pregnant or trying to get pregnant Breast-feeding How should I use this medication? Take this medication by mouth with a glass of water . Follow the directions on the package or prescription label. If you are taking the tablets, do not chew, cut, or crush this medication. If using a vitamin solution, use a specially marked spoon or dropper to measure each dose. Ask your pharmacist if you do not have one. Household spoons are not accurate. For best results take this vitamin with food. Take your medication at regular intervals. Do not take your medication more often than directed. Talk to your care team about the use of this medication in children. While this medication may be prescribed for selected conditions, precautions do apply. Overdosage: If you think you have taken too much of this medicine contact a poison control center or emergency room at once. NOTE: This medicine is only for you. Do not share this medicine with others. What if I miss a dose? If you miss a dose, take it as soon as you can. If it is almost time for your next dose, take only that dose. Do  not take double or extra doses. What may interact with this medication? Alcohol Aminosalicylic acid Colchicine Medications that suppress your bone marrow, such as chemotherapy, chloramphenicol This list may not describe all possible interactions. Give your health care provider a list of all the medicines, herbs, non-prescription drugs, or dietary supplements you use. Also tell them if you smoke, drink alcohol, or use illegal drugs. Some items may interact with your medicine. What should I watch for while using this medication? Follow a healthy diet. Taking a vitamin supplement does not replace the need for a balanced diet. Some foods that have vitamin B12 naturally are fish, seafood, egg yolk, milk, and fermented cheese. Too much of this vitamin can be unsafe. Talk to your care team about how much is right for you. What side effects may I notice from receiving this medication? Side effects that you should report to your care team as soon as possible: Allergic reactions--skin rash, itching, hives, swelling of the face, lips, tongue, or throat Side effects that usually do not require medical attention (report to your care team if they continue or are bothersome): Diarrhea Fatigue Headache Nausea This list may not describe all possible side effects. Call your doctor for medical advice about side effects. You may report side effects to FDA at 1-800-FDA-1088. Where should I keep my medication? Keep out of the reach of children and pets. Store at room temperature between 15 and 30 degrees C (59 and 85 degrees F). Protect from heat and light. Throw away any unused medication after the expiration  date. NOTE: This sheet is a summary. It may not cover all possible information. If you have questions about this medicine, talk to your doctor, pharmacist, or health care provider.  2024 Elsevier/Gold Standard (2020-10-06 00:00:00)

## 2023-10-20 ENCOUNTER — Other Ambulatory Visit

## 2023-10-20 ENCOUNTER — Telehealth: Payer: Self-pay

## 2023-10-20 DIAGNOSIS — I5022 Chronic systolic (congestive) heart failure: Secondary | ICD-10-CM

## 2023-10-20 DIAGNOSIS — I10 Essential (primary) hypertension: Secondary | ICD-10-CM | POA: Diagnosis not present

## 2023-10-20 DIAGNOSIS — L989 Disorder of the skin and subcutaneous tissue, unspecified: Secondary | ICD-10-CM

## 2023-10-20 NOTE — Telephone Encounter (Signed)
 Patient was in office for labs    Patient states when he last seen Dinah he was told that he would be referred to dermatology, however he had not heard anything.  Patient aware I will consult with PCP and have the referral coordinator contact him.

## 2023-10-20 NOTE — Telephone Encounter (Signed)
 Dermatologist referral sent.

## 2023-10-20 NOTE — Telephone Encounter (Signed)
 I will send dermatology referral if one is ordered/placed.

## 2023-10-21 LAB — COMPREHENSIVE METABOLIC PANEL WITH GFR
AG Ratio: 2.3 (calc) (ref 1.0–2.5)
ALT: 15 U/L (ref 9–46)
AST: 21 U/L (ref 10–35)
Albumin: 4.6 g/dL (ref 3.6–5.1)
Alkaline phosphatase (APISO): 58 U/L (ref 35–144)
BUN: 16 mg/dL (ref 7–25)
CO2: 32 mmol/L (ref 20–32)
Calcium: 9.8 mg/dL (ref 8.6–10.3)
Chloride: 97 mmol/L — ABNORMAL LOW (ref 98–110)
Creat: 1 mg/dL (ref 0.70–1.22)
Globulin: 2 g/dL (ref 1.9–3.7)
Glucose, Bld: 105 mg/dL — ABNORMAL HIGH (ref 65–99)
Potassium: 4.2 mmol/L (ref 3.5–5.3)
Sodium: 133 mmol/L — ABNORMAL LOW (ref 135–146)
Total Bilirubin: 0.7 mg/dL (ref 0.2–1.2)
Total Protein: 6.6 g/dL (ref 6.1–8.1)
eGFR: 75 mL/min/1.73m2 (ref >=60)

## 2023-10-21 LAB — CBC WITH DIFFERENTIAL/PLATELET
Absolute Lymphocytes: 605 {cells}/uL — ABNORMAL LOW (ref 850–3900)
Absolute Monocytes: 407 {cells}/uL (ref 200–950)
Basophils Absolute: 18 {cells}/uL (ref 0–200)
Basophils Relative: 0.5 %
Eosinophils Absolute: 108 {cells}/uL (ref 15–500)
Eosinophils Relative: 3 %
HCT: 37.3 % — ABNORMAL LOW (ref 38.5–50.0)
Hemoglobin: 12.3 g/dL — ABNORMAL LOW (ref 13.2–17.1)
MCH: 33.6 pg — ABNORMAL HIGH (ref 27.0–33.0)
MCHC: 33 g/dL (ref 32.0–36.0)
MCV: 101.9 fL — ABNORMAL HIGH (ref 80.0–100.0)
MPV: 10.5 fL (ref 7.5–12.5)
Monocytes Relative: 11.3 %
Neutro Abs: 2462 {cells}/uL (ref 1500–7800)
Neutrophils Relative %: 68.4 %
Platelets: 143 Thousand/uL (ref 140–400)
RBC: 3.66 Million/uL — ABNORMAL LOW (ref 4.20–5.80)
RDW: 11.8 % (ref 11.0–15.0)
Total Lymphocyte: 16.8 %
WBC: 3.6 Thousand/uL — ABNORMAL LOW (ref 3.8–10.8)

## 2023-10-24 DIAGNOSIS — N529 Male erectile dysfunction, unspecified: Secondary | ICD-10-CM | POA: Diagnosis not present

## 2023-10-26 ENCOUNTER — Ambulatory Visit: Payer: Self-pay | Admitting: Family

## 2023-11-06 ENCOUNTER — Telehealth: Payer: Self-pay

## 2023-11-06 NOTE — Telephone Encounter (Signed)
 Mychart message sent to patient.

## 2023-11-06 NOTE — Telephone Encounter (Signed)
 Copied from CRM [id number]. Topic: Clinical - Medication Question >> Nov 06, 2023 11:20 AM Zane F wrote: Reason for CRM:   Patient is calling in at his dentist office stating he is on a antibiotic regularly; Specialist was informed by the patient that the medication is not on file as a current medication; Specialist called over and spoke with a clinical staff member who informed her a message would need to be sent for the PCP's review and they will get back to the patient once they receive word from the patient's PCP.    Dentist Office:   [facility]  Provider: Arlyss FORBES Munch Address: [address] [place], [place] [address]  Phone: [phone]  Fax: [phone]   Callback Number:[phone]

## 2023-11-06 NOTE — Telephone Encounter (Signed)
 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

## 2023-11-06 NOTE — Telephone Encounter (Signed)
 Meant update medication that patient is currently taking at home.

## 2023-11-06 NOTE — Telephone Encounter (Signed)
 The provider would have to update, I would not know what the dental prophylaxis antibiotic is for the patient

## 2023-11-08 NOTE — Telephone Encounter (Signed)
 Message routed to PCP Ngetich, Roxan BROCKS, NP. Are we suppose to add this medication to patient list?

## 2023-11-08 NOTE — Addendum Note (Signed)
 Addended by: Nuriyah Hanline E on: 11/08/2023 02:36 PM   Modules accepted: Orders

## 2023-11-08 NOTE — Telephone Encounter (Signed)
 Medication added to patient current list. Message routed back to PCP Ngetich, Dinah C, NP as FYI.

## 2023-11-14 ENCOUNTER — Other Ambulatory Visit: Payer: Self-pay | Admitting: Cardiology

## 2023-11-14 DIAGNOSIS — I491 Atrial premature depolarization: Secondary | ICD-10-CM

## 2023-11-14 DIAGNOSIS — I5020 Unspecified systolic (congestive) heart failure: Secondary | ICD-10-CM

## 2023-11-14 DIAGNOSIS — T826XXD Infection and inflammatory reaction due to cardiac valve prosthesis, subsequent encounter: Secondary | ICD-10-CM

## 2023-11-14 DIAGNOSIS — I493 Ventricular premature depolarization: Secondary | ICD-10-CM

## 2023-11-14 DIAGNOSIS — I25118 Atherosclerotic heart disease of native coronary artery with other forms of angina pectoris: Secondary | ICD-10-CM

## 2023-11-15 ENCOUNTER — Other Ambulatory Visit (HOSPITAL_COMMUNITY): Payer: Self-pay

## 2023-11-16 ENCOUNTER — Other Ambulatory Visit (HOSPITAL_COMMUNITY)
Admission: RE | Admit: 2023-11-16 | Discharge: 2023-11-16 | Disposition: A | Discharge status: Home / Self Care | Payer: Self-pay | Source: Ambulatory Visit | Attending: Medical Genetics | Admitting: Medical Genetics

## 2023-11-23 MED ORDER — MYRBETRIQ 25 MG PO TB24: 25.0000 mg | ORAL_TABLET | Freq: Every day | ORAL | 3 refills | Status: AC

## 2023-11-23 NOTE — Telephone Encounter (Signed)
 Spoke with patient to let him know that are sending Rx refill that he is requesting to the pharmacy

## 2023-11-25 LAB — GENECONNECT MOLECULAR SCREEN: Genetic Analysis Overall Interpretation: NEGATIVE

## 2023-12-16 DIAGNOSIS — R32 Unspecified urinary incontinence: Secondary | ICD-10-CM | POA: Diagnosis not present

## 2023-12-16 DIAGNOSIS — D696 Thrombocytopenia, unspecified: Secondary | ICD-10-CM | POA: Diagnosis not present

## 2023-12-16 DIAGNOSIS — N529 Male erectile dysfunction, unspecified: Secondary | ICD-10-CM | POA: Diagnosis not present

## 2023-12-16 DIAGNOSIS — I2510 Atherosclerotic heart disease of native coronary artery without angina pectoris: Secondary | ICD-10-CM | POA: Diagnosis not present

## 2023-12-16 DIAGNOSIS — E222 Syndrome of inappropriate secretion of antidiuretic hormone: Secondary | ICD-10-CM | POA: Diagnosis not present

## 2023-12-16 DIAGNOSIS — I272 Pulmonary hypertension, unspecified: Secondary | ICD-10-CM | POA: Diagnosis not present

## 2023-12-16 DIAGNOSIS — I700 Atherosclerosis of aorta: Secondary | ICD-10-CM | POA: Diagnosis not present

## 2023-12-16 DIAGNOSIS — K709 Alcoholic liver disease, unspecified: Secondary | ICD-10-CM | POA: Diagnosis not present

## 2023-12-16 DIAGNOSIS — E785 Hyperlipidemia, unspecified: Secondary | ICD-10-CM | POA: Diagnosis not present

## 2024-01-10 ENCOUNTER — Ambulatory Visit: Payer: Self-pay

## 2024-01-10 NOTE — Telephone Encounter (Signed)
 Please schedule sooner appointment or go to Urgent care if symptoms worsen.

## 2024-01-10 NOTE — Telephone Encounter (Signed)
 FYI Only or Action Required?: FYI only for provider: appointment scheduled on 12/9.  Patient was last seen in primary care on 09/22/2023 by Ngetich, Roxan BROCKS, NP.  Called Nurse Triage reporting Knee Pain.  Symptoms began several weeks ago.  Interventions attempted: Rest, hydration, or home remedies.  Symptoms are: gradually worsening.  Triage Disposition: See PCP Within 2 Weeks  Patient/caregiver understands and will follow disposition?: Yes  Copied from CRM [id number]. Topic: Clinical - Red Word Triage >> Jan 10, 2024  9:34 AM Chiquita SQUIBB wrote: Red Word that prompted transfer to Nurse Triage: Patient has been having pain in his knee, dragging his foot, and multiple falls recently with no injuries. Reason for Disposition  Knee giving way (or buckling) when walking is a chronic symptom (recurrent or ongoing AND present > 4 weeks)  Answer Assessment - Initial Assessment Questions Left knee is getting gimpy Fall last week while playing disc golf- knee gave on disc golf course - uses cane while in the woods- landed on grass with no injury.  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  Appt made with PCP for next week per pt schedule. Ed/UC precautions advised.    1. LOCATION and RADIATION: Where is the pain located?      Left knee pain, down stairs and steep hill. 2. QUALITY: What does the pain feel like?  (e.g., sharp, dull, aching, burning)     Stiff 3. SEVERITY: How bad is the pain? What does it keep you from doing?   (Scale 1-10; or mild, moderate, severe)     Mild stiffness when immobile for awhile 4. ONSET: When did the pain start? Does it come and go, or is it there all the time?     About a month  5. RECURRENT: Have you had this pain before? If Yes, ask: When, and what happened then?     Right knee replaced 6-7 years ago  6. SETTING: Has there been any recent work, exercise or other activity that involved that part  of the body?      Very active 7. AGGRAVATING FACTORS: What makes the knee pain worse? (e.g., walking, climbing stairs, running)     Down stairs and uneven surfaces 8. ASSOCIATED SYMPTOMS: Is there any swelling or redness of the knee?     denies 9. OTHER SYMPTOMS: Do you have any other symptoms? (e.g., calf pain, chest pain, difficulty breathing, fever)     Denies  Protocols used: Knee Pain-A-AH

## 2024-01-10 NOTE — Telephone Encounter (Signed)
 Appointment scheduled for tomorrow at 9 am with [facility]

## 2024-01-11 ENCOUNTER — Encounter: Payer: Self-pay | Admitting: Dermatology

## 2024-01-11 ENCOUNTER — Ambulatory Visit: Admitting: Orthopedic Surgery

## 2024-01-11 ENCOUNTER — Encounter: Payer: Self-pay | Admitting: Orthopedic Surgery

## 2024-01-11 ENCOUNTER — Ambulatory Visit: Admitting: Dermatology

## 2024-01-11 VITALS — BP 122/70 | HR 72 | Temp 98.2°F | Ht 71.0 in | Wt 175.6 lb

## 2024-01-11 VITALS — BP 109/68 | HR 58 | Temp 97.9°F

## 2024-01-11 DIAGNOSIS — G8929 Other chronic pain: Secondary | ICD-10-CM

## 2024-01-11 DIAGNOSIS — L814 Other melanin hyperpigmentation: Secondary | ICD-10-CM | POA: Diagnosis not present

## 2024-01-11 DIAGNOSIS — R2689 Other abnormalities of gait and mobility: Secondary | ICD-10-CM

## 2024-01-11 DIAGNOSIS — L821 Other seborrheic keratosis: Secondary | ICD-10-CM

## 2024-01-11 DIAGNOSIS — M25562 Pain in left knee: Secondary | ICD-10-CM

## 2024-01-11 DIAGNOSIS — C44519 Basal cell carcinoma of skin of other part of trunk: Secondary | ICD-10-CM

## 2024-01-11 DIAGNOSIS — R2681 Unsteadiness on feet: Secondary | ICD-10-CM | POA: Diagnosis not present

## 2024-01-11 DIAGNOSIS — D485 Neoplasm of uncertain behavior of skin: Secondary | ICD-10-CM | POA: Diagnosis not present

## 2024-01-11 NOTE — Patient Instructions (Signed)

## 2024-01-11 NOTE — Progress Notes (Signed)
   New Patient Visit   History of Present Illness [name] is an 83 year old male who presents with concerns about skin spots. He is accompanied by his wife, Ava.  He is primarily here because his wife is concerned about spots on his back. The spots do not bother him at all.  He is not aware of this spot and does not recall any itching or discomfort associated with it.  He and his wife have been married for eight months, and he enjoys wearing matching hats, which have gained attention on social media. He lives with Ava's elderly mother, and he has picked up some Italian from their conversations. His wife is from [place].  Patient states he  has spots located at the back that he  would like to have examined. Patient reports the areas have been there for 1 year.  Patient reports he  has not previously been treated for these areas. Patient denies Hx of bx. Patient denies family history of skin cancer(s).  The following portions of the chart were reviewed this encounter and updated as appropriate: medications, allergies, medical history  Review of Systems:  No other skin or systemic complaints except as noted in HPI or Assessment and Plan.  Objective  Well appearing patient in no apparent distress; mood and affect are within normal limits.  A focused examination was performed of the following areas: Back  Relevant exam findings are noted in the Assessment and Plan.  Left Lower Back 1 cm pink scaly plaque  Assessment & Plan   SEBORRHEIC KERATOSIS - Stuck-on, waxy, tan-brown papules and/or plaques  - Benign-appearing - Discussed benign etiology and prognosis. - Observe - Call for any changes  LENTIGINES Exam: scattered tan macules Due to sun exposure Treatment Plan: Benign-appearing, observe. Recommend daily broad spectrum sunscreen SPF 30+ to sun-exposed areas, reapply every 2 hours as needed.  Call for any changes  NEOPLASM OF UNCERTAIN BEHAVIOR OF  SKIN Left Lower Back Epidermal / dermal shaving  Lesion diameter (cm):  1 Informed consent: discussed and consent obtained   Timeout: patient name, date of birth, surgical site, and procedure verified   Procedure prep:  Patient was prepped and draped in usual sterile fashion Prep type:  Isopropyl alcohol Anesthesia: the lesion was anesthetized in a standard fashion   Anesthetic:  1% lidocaine  w/ epinephrine  1-100,000 buffered w/ 8.4% NaHCO3 Instrument used: DermaBlade and flexible razor blade   Hemostasis achieved with: pressure, aluminum chloride and electrodesiccation   Outcome: patient tolerated procedure well   Post-procedure details: sterile dressing applied and wound care instructions given   Dressing type: bandage and petrolatum     Specimen 1 - Surgical pathology Differential Diagnosis: R/O NMSC vs Other  Check Margins: No LENTIGINES   SEBORRHEIC KERATOSES    Return in about 6 months (around 07/11/2024) for TBSE Sheliah or KP).  I, Jetta Ager, am acting as scribe for RUFUS CHRISTELLA HOLY, MD.  Documentation: I have reviewed the above documentation for accuracy and completeness, and I agree with the above.  RUFUS CHRISTELLA HOLY, MD

## 2024-01-11 NOTE — Patient Instructions (Addendum)
 Referral made for PT at [facility] location   Consider tylenol  for arthritic pain   Consider seeing Dr. Melodi at [facility] if left knee pain continues> we do not make referral> you just call to schedule> [phone]

## 2024-01-11 NOTE — Progress Notes (Signed)
 Careteam: Patient Care Team: Ngetich, Roxan BROCKS, NP as PCP - General (Family Medicine) Elmira Newman PARAS, MD as PCP - Cardiology (Cardiology) Cardiovascular, [place]  Seen by: Greig Cluster, AGNP-C  PLACE OF SERVICE:  [facility]  Advanced Directive information    Allergies  Allergen Reactions   Bactrim [Sulfamethoxazole-Trimethoprim] Rash    Chief Complaint  Patient presents with   Knee Pain     HPI: Patient is a 83 y.o. male seen today for acute visit due to knee pain.   Discussed the use of AI scribe software for clinical note transcription with the patient, who gave verbal consent to proceed.  History of Present Illness   [name] is an 83 year old male who presents with balance issues and occasional knee pain.  He has been experiencing balance issues over the past month or two, which led to a fall last week while playing disc golf. No injuries were sustained from the fall. The balance issues are frequent, but there is no dizziness, lightheadedness, or sensation of the room spinning. He remains very active, attending the gym several times a week and playing disc golf regularly.  He experiences occasional pain in his left knee, particularly when sitting for long periods or when stepping down, especially on steep downhills. He uses a cane for balance on uneven terrain. Reports right knee replacement by Dr. Aluisio about seven or eight years ago. The pain is minor and occurs occasionally.    Review of Systems:  Review of Systems  Constitutional: Negative.   Respiratory: Negative.    Cardiovascular: Negative.   Musculoskeletal:  Positive for joint pain.  Neurological:  Negative for dizziness and weakness.  Psychiatric/Behavioral: Negative.      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   CHF (congestive heart failure) (HCC)    Elevated cholesterol    Gout    Hepatitis 1964   hepatitis A     HFrEF (heart failure with reduced ejection fraction) (HCC)    Hypertension    Moderate mitral regurgitation    Pre-diabetes    Severe aortic stenosis    Past Surgical History:  Procedure Laterality Date   AORTIC VALVE REPLACEMENT N/A 07/24/2017   Procedure: AORTIC VALVE REPLACEMENT (AVR) using 23mm Inspiris Aortic Valve;  Surgeon: Lucas Dorise POUR, MD;  Location: [facility] OR;  Service: Open Heart Surgery;  Laterality: N/A;   CLIPPING OF ATRIAL APPENDAGE N/A 07/24/2017   Procedure: CLIPPING OF ATRIAL APPENDAGE using a 50mm Atricure clip;  Surgeon: Lucas Dorise POUR, MD;  Location: [facility] OR;  Service: Open Heart Surgery;  Laterality: N/A;   CORONARY ARTERY BYPASS GRAFT N/A 07/24/2017   Procedure: CORONARY ARTERY BYPASS GRAFTING (CABG) times 3 using the left greater saphenous vein harvested endoscopically and left internal mammary artery.;  Surgeon: Lucas Dorise POUR, MD;  Location: [facility] OR;  Service: Open Heart Surgery;  Laterality: N/A;   EYE SURGERY Bilateral    lens replacements for cataracts   HERNIA REPAIR     INGUINAL HERNIA REPAIR Right 05/06/2015   Procedure: LAPAROSCOPIC RIGHT INGUINAL HERNIA WITH MESH;  Surgeon: Vicenta Poli, MD;  Location: [facility] ORS;  Service: General;  Laterality: Right;   INSERTION OF MESH Right 05/06/2015   Procedure: INSERTION OF MESH;  Surgeon: Vicenta Poli, MD;  Location: [facility] ORS;  Service: General;  Laterality: Right;   IR BONE MARROW BIOPSY & ASPIRATION  09/15/2022  RIGHT HEART CATH N/A 03/21/2023   Procedure: RIGHT HEART CATH;  Surgeon: Elmira Newman PARAS, MD;  Location: [facility] INVASIVE CV LAB;  Service: Cardiovascular;  Laterality: N/A;   RIGHT/LEFT HEART CATH AND CORONARY ANGIOGRAPHY N/A 07/10/2017   Procedure: RIGHT/LEFT HEART CATH AND CORONARY ANGIOGRAPHY;  Surgeon: Elmira Newman PARAS, MD;  Location: [facility] INVASIVE CV LAB;  Service: Cardiovascular;  Laterality: N/A;   TEE WITHOUT CARDIOVERSION N/A 07/11/2017   Procedure: TRANSESOPHAGEAL ECHOCARDIOGRAM (TEE);  Surgeon:  Elmira Newman PARAS, MD;  Location: [facility] ENDOSCOPY;  Service: Cardiovascular;  Laterality: N/A;   TEE WITHOUT CARDIOVERSION N/A 07/24/2017   Procedure: TRANSESOPHAGEAL ECHOCARDIOGRAM (TEE);  Surgeon: Lucas Dorise POUR, MD;  Location: [facility] OR;  Service: Open Heart Surgery;  Laterality: N/A;   TEE WITHOUT CARDIOVERSION N/A 08/01/2022   Procedure: TRANSESOPHAGEAL ECHOCARDIOGRAM;  Surgeon: Pietro Redell RAMAN, MD;  Location: [facility] INVASIVE CV LAB;  Service: Cardiovascular;  Laterality: N/A;   TOTAL KNEE ARTHROPLASTY Right 08/07/2019   Procedure: TOTAL KNEE ARTHROPLASTY;  Surgeon: Melodi Lerner, MD;  Location: [facility] ORS;  Service: Orthopedics;  Laterality: Right;   ULTRASOUND GUIDANCE FOR VASCULAR ACCESS  07/10/2017   Procedure: Ultrasound Guidance For Vascular Access;  Surgeon: Elmira Newman PARAS, MD;  Location: [facility] INVASIVE CV LAB;  Service: Cardiovascular;;   Social History:   reports that he quit smoking about 52 years ago. His smoking use included cigarettes. He started smoking about 64 years ago. He has a 12 pack-year smoking history. He has never used smokeless tobacco. He reports current alcohol use. He reports current drug use. Drug: Marijuana.  Family History  Problem Relation Age of Onset   Stroke Mother    Heart attack Father     Medications: Patient's Medications  New Prescriptions   No medications on file  Previous Medications   ACETAMINOPHEN  (TYLENOL ) 500 MG TABLET    Take 2 tablets (1,000 mg total) by mouth every 6 (six) hours as needed for mild pain or fever.   ASCORBIC ACID  (VITAMIN C ) 500 MG TABLET    TAKE 1 TABLET (500 MG TOTAL) BY MOUTH DAILY.   ASPIRIN  EC 81 MG TABLET    Take 81 mg by mouth daily. Swallow whole.   ATORVASTATIN  (LIPITOR) 80 MG TABLET    TAKE 1 TABLET BY MOUTH EVERY DAY   AZELASTINE (ASTELIN) 0.1 % NASAL SPRAY    Place into both nostrils as needed for rhinitis. Use in each nostril as directed   CEFADROXIL  (DURICEF) 500 MG CAPSULE    Take 500 mg by mouth daily. Dr.Comer    CYANOCOBALAMIN  (VITAMIN B12) 1000 MCG TABLET    Take 1,000 mcg by mouth daily.   FERROUS GLUCONATE  (FERGON) 324 MG TABLET    TAKE 1 TABLET BY MOUTH EVERY DAY WITH BREAKFAST   METOPROLOL  SUCCINATE (TOPROL -XL) 25 MG 24 HR TABLET    Take 1 tablet (25 mg total) by mouth daily.   MULTIPLE VITAMIN (MULTIVITAMIN ADULT PO)    Take 1 tablet by mouth daily.   MYRBETRIQ  25 MG TB24 TABLET    Take 1 tablet (25 mg total) by mouth daily.   OMEGA-3 FATTY ACIDS (FISH OIL) 1000 MG CAPS    Take 2 capsules by mouth daily.   TAMSULOSIN  (FLOMAX ) 0.4 MG CAPS CAPSULE    TAKE 1 CAPSULE BY MOUTH EVERY DAY   TORSEMIDE  (DEMADEX ) 10 MG TABLET    Take 1 tablet (10 mg total) by mouth 2 (two) times daily.  Modified Medications   No medications on file  Discontinued Medications  No medications on file    Physical Exam:  Vitals:   01/11/24 0833  BP: 122/70  Pulse: 72  Temp: 98.2 F (36.8 C)  SpO2: 94%  Weight: 175 lb 9.6 oz (79.7 kg)  Height: 5' 11 (1.803 m)   Body mass index is 24.49 kg/m. Wt Readings from Last 3 Encounters:  01/11/24 175 lb 9.6 oz (79.7 kg)  10/11/23 172 lb (78 kg)  09/22/23 168 lb 9.6 oz (76.5 kg)    Physical Exam Vitals reviewed.  Constitutional:      General: He is not in acute distress. HENT:     Head: Normocephalic.  Eyes:     General:        Right eye: No discharge.        Left eye: No discharge.  Cardiovascular:     Rate and Rhythm: Normal rate and regular rhythm.     Pulses: Normal pulses.     Heart sounds: Murmur heard.  Pulmonary:     Effort: Pulmonary effort is normal.     Breath sounds: Normal breath sounds.  Musculoskeletal:        General: No swelling or deformity. Normal range of motion.     Cervical back: Neck supple.  Neurological:     Mental Status: He is alert.     Motor: No weakness.     Gait: Gait normal.  Psychiatric:        Mood and Affect: Mood normal.     Labs reviewed: Basic Metabolic Panel: Recent Labs    08/22/23 1021 09/22/23 0942  10/20/23 0909  NA 140 131* 133*  K 5.0 4.6 4.2  CL 104 97* 97*  CO2 29 23 32  GLUCOSE 104* 96 105*  BUN 17 16 16   CREATININE 0.98 0.91 1.00  CALCIUM  9.8 10.0 9.8  TSH  --  1.23  --    Liver Function Tests: Recent Labs    01/25/23 1039 09/22/23 0942 10/20/23 0909  AST 19 21 21   ALT 15 16 15   BILITOT 0.7 0.8 0.7  PROT 6.8 6.2 6.6   No results for input(s): LIPASE, AMYLASE in the last 8760 hours. No results for input(s): AMMONIA in the last 8760 hours. CBC: Recent Labs    08/22/23 1021 10/11/23 0855 10/20/23 0909  WBC 4.2 3.7* 3.6*  NEUTROABS 2,751 2.4 2,462  HGB 12.2* 11.9* 12.3*  HCT 38.1* 34.5* 37.3*  MCV 101.9* 97.7 101.9*  PLT 152 126* 143   Lipid Panel: Recent Labs    03/17/23 1458 09/22/23 0942  CHOL  --  132  HDL  --  56  LDLCALC  --  63  TRIG  --  54  CHOLHDL  --  2.4  LDLDIRECT 53  --    TSH: Recent Labs    09/22/23 0942  TSH 1.23   A1C: Lab Results  Component Value Date   HGBA1C 5.7 (H) 07/20/2017     Assessment/Plan 1. Unstable gait (Primary) - began about 1 month ago - 1 fall reported> no injury - Ambulatory referral to Physical Therapy  2. Balance problem - denies dizziness or lightheadedness  - BP 122/70 today  - Ambulatory referral to Physical Therapy  3. Chronic pain of left knee - intermittent  - h/o RTKA by Dr. Melodi - recommend tylenol  prn for arthritic pain   Total time: 21 minutes. Greater than 50% of total time spent doing patient education regarding unstable gait, balance issues and left knee pain including symptom/medication management.  Next appt: Visit date not found  [name]  [facility] [phone]

## 2024-01-12 LAB — SURGICAL PATHOLOGY

## 2024-01-16 ENCOUNTER — Ambulatory Visit: Payer: Self-pay | Admitting: Dermatology

## 2024-01-16 ENCOUNTER — Ambulatory Visit: Admitting: Family

## 2024-01-16 NOTE — Progress Notes (Signed)
 Spoke with pt gave him bx results and treatment recommendation

## 2024-01-17 ENCOUNTER — Other Ambulatory Visit: Payer: Self-pay

## 2024-01-17 ENCOUNTER — Ambulatory Visit: Attending: Orthopedic Surgery

## 2024-01-17 DIAGNOSIS — R262 Difficulty in walking, not elsewhere classified: Secondary | ICD-10-CM | POA: Insufficient documentation

## 2024-01-17 DIAGNOSIS — R293 Abnormal posture: Secondary | ICD-10-CM | POA: Insufficient documentation

## 2024-01-17 DIAGNOSIS — M6281 Muscle weakness (generalized): Secondary | ICD-10-CM | POA: Insufficient documentation

## 2024-01-17 DIAGNOSIS — R2681 Unsteadiness on feet: Secondary | ICD-10-CM | POA: Diagnosis present

## 2024-01-17 DIAGNOSIS — R2689 Other abnormalities of gait and mobility: Secondary | ICD-10-CM | POA: Diagnosis not present

## 2024-01-17 DIAGNOSIS — R252 Cramp and spasm: Secondary | ICD-10-CM | POA: Insufficient documentation

## 2024-01-17 NOTE — Therapy (Signed)
 OUTPATIENT PHYSICAL THERAPY LOWER EXTREMITY EVALUATION   Patient Name: [name] MRN: [id number] DOB:[birth year], 83 y.o., male Today's Date: 01/17/2024  END OF SESSION:  PT End of Session - 01/17/24 1152     Visit Number 1    Date for Recertification  03/13/24    Authorization Type [employer]    PT Start Time 1148    PT Stop Time 1230    PT Time Calculation (min) 42 min    Activity Tolerance Patient tolerated treatment well    Behavior During Therapy WFL for tasks assessed/performed          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   CHF (congestive heart failure) (HCC)    Elevated cholesterol    Gout    Hepatitis 1964   hepatitis A    HFrEF (heart failure with reduced ejection fraction) (HCC)    Hypertension    Moderate mitral regurgitation    Pre-diabetes    Severe aortic stenosis    Past Surgical History:  Procedure Laterality Date   AORTIC VALVE REPLACEMENT N/A 07/24/2017   Procedure: AORTIC VALVE REPLACEMENT (AVR) using 23mm Inspiris Aortic Valve;  Surgeon: Lucas Dorise POUR, MD;  Location: [facility] OR;  Service: Open Heart Surgery;  Laterality: N/A;   CLIPPING OF ATRIAL APPENDAGE N/A 07/24/2017   Procedure: CLIPPING OF ATRIAL APPENDAGE using a 50mm Atricure clip;  Surgeon: Lucas Dorise POUR, MD;  Location: [facility] OR;  Service: Open Heart Surgery;  Laterality: N/A;   CORONARY ARTERY BYPASS GRAFT N/A 07/24/2017   Procedure: CORONARY ARTERY BYPASS GRAFTING (CABG) times 3 using the left greater saphenous vein harvested endoscopically and left internal mammary artery.;  Surgeon: Lucas Dorise POUR, MD;  Location: [facility] OR;  Service: Open Heart Surgery;  Laterality: N/A;   EYE SURGERY Bilateral    lens replacements for cataracts   HERNIA REPAIR     INGUINAL HERNIA REPAIR Right 05/06/2015   Procedure: LAPAROSCOPIC RIGHT INGUINAL HERNIA WITH MESH;  Surgeon: Vicenta Poli, MD;  Location: [facility]S;  Service: General;   Laterality: Right;   INSERTION OF MESH Right 05/06/2015   Procedure: INSERTION OF MESH;  Surgeon: Vicenta Poli, MD;  Location: [facility];  Service: General;  Laterality: Right;   IR BONE MARROW BIOPSY & ASPIRATION  09/15/2022   RIGHT HEART CATH N/A 03/21/2023   Procedure: RIGHT HEART CATH;  Surgeon: Elmira Newman PARAS, MD;  Location: [facility];  Service: Cardiovascular;  Laterality: N/A;   RIGHT/LEFT HEART CATH AND CORONARY ANGIOGRAPHY N/A 07/10/2017   Procedure: RIGHT/LEFT HEART CATH AND CORONARY ANGIOGRAPHY;  Surgeon: Elmira Newman PARAS, MD;  Location: [facility];  Service: Cardiovascular;  Laterality: N/A;   TEE WITHOUT CARDIOVERSION N/A 07/11/2017   Procedure: TRANSESOPHAGEAL ECHOCARDIOGRAM (TEE);  Surgeon: Elmira Newman PARAS, MD;  Location: [facility] ENDOSCOPY;  Service: Cardiovascular;  Laterality: N/A;   TEE WITHOUT CARDIOVERSION N/A 07/24/2017   Procedure: TRANSESOPHAGEAL ECHOCARDIOGRAM (TEE);  Surgeon: Lucas Dorise POUR, MD;  Location: [facility] OR;  Service: Open Heart Surgery;  Laterality: N/A;   TEE WITHOUT CARDIOVERSION N/A 08/01/2022   Procedure: TRANSESOPHAGEAL ECHOCARDIOGRAM;  Surgeon: Pietro Redell RAMAN, MD;  Location: [facility] INVASIVE CV LAB;  Service: Cardiovascular;  Laterality: N/A;   TOTAL KNEE ARTHROPLASTY Right 08/07/2019   Procedure: TOTAL KNEE ARTHROPLASTY;  Surgeon: Melodi Lerner, MD;  Location: [facility];  Service: Orthopedics;  Laterality: Right;   ULTRASOUND GUIDANCE FOR VASCULAR ACCESS  07/10/2017   Procedure: Ultrasound Guidance For Vascular Access;  Surgeon: Elmira Newman PARAS, MD;  Location: [facility];  Service: Cardiovascular;;   Patient Active Problem List   Diagnosis Date Noted   Presence of heart assist device (HCC) 03/29/2023   Leukopenia 09/13/2022   PICC (peripherally inserted central catheter) in place 08/30/2022   Pure hypercholesterolemia 08/03/2022   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   Encounter for assessment of peripherally inserted  central catheter (PICC) 08/02/2022   Aortic valve endocarditis 08/01/2022   Cardiomyopathy (HCC) 08/01/2022   Chronic HFrEF (heart failure with reduced ejection fraction) (HCC) 08/01/2022   Prosthetic cardiac valve vegetation 08/01/2022   Anemia 08/01/2022   Streptococcal bacteremia 07/29/2022   Aortic atherosclerosis 07/29/2022   Medication management 07/29/2022   Bacteremia 07/29/2022   Low back pain 06/24/2022   Constipation 06/24/2022   Renal cyst, 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   PAC (premature atrial contraction) 03/01/2021   PVC (premature ventricular contraction) 03/01/2021   Atrial bigeminy 02/28/2020   OA (osteoarthritis) of knee 08/07/2019   Primary osteoarthritis of right knee 08/07/2019   Pain in right knee 05/17/2019   Coronary artery disease involving coronary bypass graft of native heart without angina pectoris 09/19/2018   Weakness 08/01/2018   Mixed hyperlipidemia 06/06/2018   S/P left atrial appendage ligation 07/25/2017   S/P AVR 07/25/2017   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   Coronary artery disease without angina pectoris 07/12/2017   HFrEF (heart failure with reduced ejection fraction) (HCC) 07/07/2017   Mitral regurgitation 07/07/2017   (HFpEF) heart failure with preserved ejection fraction (HCC) 07/07/2017   Gout 02/12/2013   Benign hypertension 02/12/2013   GERD (gastroesophageal reflux disease) 02/12/2013    PCP: Leonarda Roxan BROCKS, NP  REFERRING PROVIDER: Gil Greig BRAVO, NP  REFERRING DIAG: R26.81 (ICD-10-CM) - Unstable gait R26.89 (ICD-10-CM) - Balance problem  THERAPY DIAG:  Difficulty in walking, not elsewhere classified - Plan: PT plan of care cert/re-cert  Unsteadiness on feet - Plan: PT plan of care cert/re-cert  Muscle weakness (generalized) - Plan: PT plan of care cert/re-cert  Abnormal posture - Plan: PT plan of care cert/re-cert  Rationale for Evaluation and Treatment: Rehabilitation  ONSET DATE: 01/11/2024  SUBJECTIVE:   SUBJECTIVE  STATEMENT: Patient reports he is quite active going to the gym and playing disc golf a lot.  He also enjoys walking but has not been able to due to the weather in the past few days.  He states he feels better when he is up moving.  He states the left knee gives way frequently so he uses a cane with a seat on it when playing disc golf.  He hopes to gain strength in the left LE to reduce fall risk.    PERTINENT HISTORY: na PAIN:  Are you having pain? Yes: NPRS scale: 0/10 now and 4-5/10 at worst Pain location: left knee Pain description: aching Aggravating factors: downhill or down steps Relieving factors: rest   PRECAUTIONS: Fall  RED FLAGS: None   WEIGHT BEARING RESTRICTIONS: No  FALLS:  Has patient fallen in last 6 months? Yes. 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  DIAGNOSTIC FINDINGS: na  PATIENT SURVEYS:  LEFS  Extreme difficulty/unable (0), Quite a bit of difficulty (1), Moderate difficulty (2), Little difficulty (3), No difficulty (4) Survey date:    Any of your usual work, housework or school activities   2. Usual hobbies, recreational or sporting activities   3. Getting into/out of the bath   4. Walking between rooms   5. Putting on socks/shoes   6. Squatting    7. Lifting an object, like a bag of groceries from  the floor   8. Performing light activities around your home   9. Performing heavy activities around your home   10. Getting into/out of a car   11. Walking 2 blocks   12. Walking 1 mile   13. Going up/down 10 stairs (1 flight)   14. Standing for 1 hour   15.  sitting for 1 hour   16. Running on even ground   17. Running on uneven ground   18. Making sharp turns while running fast   19. Hopping    20. Rolling over in bed   Score total:  62/80     COGNITION: Overall cognitive status: Within functional limits for tasks assessed     SENSATION: WFL   MUSCLE LENGTH: Hamstrings: Right 45 deg; Left 40 deg Thomas test: Right pos; Left pos  POSTURE: rounded shoulders and forward head  PALPATION: Mild crepitus   LOWER EXTREMITY ROM:  Right knee flexion : approx 110 degrees,  extension approx -20 Left knee flexion: approx 100 degrees, extension approx -20  LOWER EXTREMITY MMT:   4/5 extension bilaterally   4/5 flexion bilaterally  LOWER EXTREMITY SPECIAL TESTS:  Knee special tests: Patellafemoral grind test: negative  FUNCTIONAL TESTS:  5 times sit to stand: 12.03 sec Timed up and go (TUG): 13.15  GAIT: Distance walked: 30 feet Assistive device utilized: None Level of assistance: Complete Independence Comments: slight deviation on turns but otherwise good heel to toe gait                                                                                                                                TREATMENT DATE: 01/17/24 Initial eval completed and initial HEP established Educated on fall prevention and fall risk   PATIENT EDUCATION:  Education details: Discussed appropriate level of exercise and reason for falls, fall prevention Person educated: Patient Education method: Programmer, Multimedia, Facilities Manager, Verbal cues, and Handouts Education comprehension: verbalized understanding, returned demonstration, and verbal cues required  HOME EXERCISE PROGRAM: Access Code:  [email or web address] URL: [email or web address] Date: 01/17/2024 Prepared by: Delon Haddock  Exercises - Seated Knee Extension Stretch with Chair  - 1 x daily - 7 x weekly - 1 sets - 1-5 reps -  Seated Hamstring Stretch  - 1 x daily - 7 x weekly - 1 sets - 3 reps - 30 sec hold  ASSESSMENT:  CLINICAL IMPRESSION: Patient is a 83 y.o. male who was seen today for physical therapy evaluation and treatment for balance and unsteady gait.  He presents with decreased flexibility, left knee crepitus on palpation, weakness, and fall risk.  He would benefit from skilled PT for quad rehab, balance training, gait training and fall prevention.    OBJECTIVE IMPAIRMENTS: Abnormal gait, decreased balance, difficulty walking, decreased strength, hypomobility, increased fascial restrictions, increased muscle spasms, impaired flexibility, improper body mechanics, postural dysfunction, and pain.   ACTIVITY LIMITATIONS: carrying, lifting, standing, squatting, sleeping, stairs, transfers, bathing, toileting, dressing, hygiene/grooming, and caring for others  PARTICIPATION LIMITATIONS: meal prep, cleaning, laundry, interpersonal relationship, driving, shopping, and yard work  PERSONAL FACTORS: Age, Fitness, Past/current experiences, and 1-2 comorbidities: pacemaker Htn, CHF are also affecting patient's functional outcome.   REHAB POTENTIAL: Good  CLINICAL DECISION MAKING: Stable/uncomplicated  EVALUATION COMPLEXITY: Low   GOALS: Goals reviewed with patient? Yes  SHORT TERM GOALS: Target date: 02/14/2024  Pain report to be no greater than 4/10  Baseline: Goal status: INITIAL  2.  Patient will be independent with initial HEP  Baseline:  Goal status: INITIAL   LONG TERM GOALS: Target date: 03/13/2024  Patient to report pain no greater than 2/10  Baseline:  Goal status: INITIAL  2.  Patient to be independent with advanced HEP  Baseline:  Goal status: INITIAL  3.  BERG to improve by 2-3  points Baseline:  Goal status: INITIAL  4.  Functional tests to improve by 2-3 sec Baseline:  Goal status: INITIAL  5.  No falls during PT episode Baseline:  Goal status: INITIAL  6.  Patient to be able to ascend and descend steps without pain or no greater than 2/10  Baseline:  Goal status: INITIAL   PLAN:  PT FREQUENCY: 2x/week  PT DURATION: 8 weeks  PLANNED INTERVENTIONS: 97110-Therapeutic exercises, 97530- Therapeutic activity, 97112- Neuromuscular re-education, 97535- Self Care, [id number]- Manual therapy, [phone]- Gait training, [phone]- Canalith repositioning, [id number]- Aquatic Therapy, [phone]- Electrical stimulation (unattended), [phone]- Electrical stimulation (manual), [id number]- Vasopneumatic device, [id number]- Ultrasound, [id number]- Traction (mechanical), [id number]- Ionotophoresis 4mg /ml Dexamethasone , [id number] (1-2 muscles), [id number] (3+ muscles)- Dry Needling, Patient/Family education, Balance training, Stair training, Taping, Joint mobilization, Spinal mobilization, Vestibular training, DME instructions, Cryotherapy, and Moist heat  PLAN FOR NEXT SESSION: Nustep, begin quad rehab and balance training   Marisue Canion B. Myiesha Edgar, PT 01/17/24 8:56 PM [facility] [address], [address] [place], [place] [address] Phone # [phone] Fax [phone]

## 2024-01-19 ENCOUNTER — Ambulatory Visit

## 2024-01-19 DIAGNOSIS — R262 Difficulty in walking, not elsewhere classified: Secondary | ICD-10-CM

## 2024-01-19 DIAGNOSIS — R252 Cramp and spasm: Secondary | ICD-10-CM

## 2024-01-19 DIAGNOSIS — R293 Abnormal posture: Secondary | ICD-10-CM

## 2024-01-19 DIAGNOSIS — M6281 Muscle weakness (generalized): Secondary | ICD-10-CM

## 2024-01-19 DIAGNOSIS — R2681 Unsteadiness on feet: Secondary | ICD-10-CM

## 2024-01-19 NOTE — Therapy (Signed)
 OUTPATIENT PHYSICAL THERAPY LOWER EXTREMITY TREATMENT   Patient Name: [name] MRN: [id number] DOB:18-Sep-1940, 83 y.o., male Today's Date: 01/19/2024  END OF SESSION:  PT End of Session - 01/19/24 0935     Visit Number 2    Date for Recertification  03/13/24    Authorization Type [employer]    PT Start Time [phone]    PT Stop Time 1016    PT Time Calculation (min) 43 min    Activity Tolerance Patient tolerated treatment well    Behavior During Therapy WFL for tasks assessed/performed          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   CHF (congestive heart failure) (HCC)    Elevated cholesterol    Gout    Hepatitis 1964   hepatitis A    HFrEF (heart failure with reduced ejection fraction) (HCC)    Hypertension    Moderate mitral regurgitation    Pre-diabetes    Severe aortic stenosis    Past Surgical History:  Procedure Laterality Date   AORTIC VALVE REPLACEMENT N/A 07/24/2017   Procedure: AORTIC VALVE REPLACEMENT (AVR) using 23mm Inspiris Aortic Valve;  Surgeon: Lucas Dorise POUR, MD;  Location: [facility] OR;  Service: Open Heart Surgery;  Laterality: N/A;   CLIPPING OF ATRIAL APPENDAGE N/A 07/24/2017   Procedure: CLIPPING OF ATRIAL APPENDAGE using a 50mm Atricure clip;  Surgeon: Lucas Dorise POUR, MD;  Location: [facility] OR;  Service: Open Heart Surgery;  Laterality: N/A;   CORONARY ARTERY BYPASS GRAFT N/A 07/24/2017   Procedure: CORONARY ARTERY BYPASS GRAFTING (CABG) times 3 using the left greater saphenous vein harvested endoscopically and left internal mammary artery.;  Surgeon: Lucas Dorise POUR, MD;  Location: [facility] OR;  Service: Open Heart Surgery;  Laterality: N/A;   EYE SURGERY Bilateral    lens replacements for cataracts   HERNIA REPAIR     INGUINAL HERNIA REPAIR Right 05/06/2015   Procedure: LAPAROSCOPIC RIGHT INGUINAL HERNIA WITH MESH;  Surgeon: Vicenta Poli, MD;  Location: [facility] ORS;  Service: General;   Laterality: Right;   INSERTION OF MESH Right 05/06/2015   Procedure: INSERTION OF MESH;  Surgeon: Vicenta Poli, MD;  Location: [facility] ORS;  Service: General;  Laterality: Right;   IR BONE MARROW BIOPSY & ASPIRATION  09/15/2022   RIGHT HEART CATH N/A 03/21/2023   Procedure: RIGHT HEART CATH;  Surgeon: Elmira Newman PARAS, MD;  Location: [facility] INVASIVE CV LAB;  Service: Cardiovascular;  Laterality: N/A;   RIGHT/LEFT HEART CATH AND CORONARY ANGIOGRAPHY N/A 07/10/2017   Procedure: RIGHT/LEFT HEART CATH AND CORONARY ANGIOGRAPHY;  Surgeon: Elmira Newman PARAS, MD;  Location: [facility] INVAS[facility] CV [facility];  Service: Cardiovascular;  Laterality: N/A;   TEE WITHOUT CARDIOVERSION N/A 07/11/2017   Procedure: TRANSESOPHAGEAL ECHOCARDIOGRAM (TEE);  Surgeon: Elmira Newman PARAS, MD;  Location[facility] ENDOSCOPY;  Service: Cardiovascular;  Laterality: N/A;   TEE WITHOUT CARDIOVERSION N/A 07/24/2017   Procedure: TRANSESOPHAGEAL ECHOCARDIOGRAM (TEE);  Surgeon: Lucas Dorise POUR, MD;  Location: [facility] OR;  Service: Open Heart Surgery;  Laterality: N/A;   TEE WITHOUT CARDIOVERSION N/A 08/01/2022   Procedure: TRANSESOPHAGEAL ECHOCARDIOGRAM;  Surgeon: Pietro Redell RAMAN, MD;  Location: [facility] INVASIVE CV LAB;  Service: Cardiovascular;  Laterality: N/A;   TOTAL KNEE ARTHROPLASTY Right 08/07/2019   Procedure: TOTAL KNEE ARTHROPLASTY;  Surgeon: Melodi Lerner, MD;  Location: [facility] ORS;  Service: Orthopedics;  Laterality: Right;   ULTRASOUND GUIDANCE FOR VASCULAR ACCESS  07/10/2017   Procedure: Ultrasound Guidance For Vascular Access;  Surgeon: Elmira Newman PARAS, MD;  Location: [facility] INVASIVE CV LAB;  Service: Cardiovascular;;   Patient Active Problem List   Diagnosis Date Noted   Presence of heart assist device (HCC) 03/29/2023   Leukopenia 09/13/2022   PICC (peripherally inserted central catheter) in place 08/30/2022   Pure hypercholesterolemia 08/03/2022   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   Encounter for assessment of peripherally inserted  central catheter (PICC) 08/02/2022   Aortic valve endocarditis 08/01/2022   Cardiomyopathy (HCC) 08/01/2022   Chronic HFrEF (heart failure with reduced ejection fraction) (HCC) 08/01/2022   Prosthetic cardiac valve vegetation 08/01/2022   Anemia 08/01/2022   Streptococcal bacteremia 07/29/2022   Aortic atherosclerosis 07/29/2022   Medication management 07/29/2022   Bacteremia 07/29/2022   Low back pain 06/24/2022   Constipation 06/24/2022   Renal cyst, 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   PAC (premature atrial contraction) 03/01/2021   PVC (premature ventricular contraction) 03/01/2021   Atrial bigeminy 02/28/2020   OA (osteoarthritis) of knee 08/07/2019   Primary osteoarthritis of right knee 08/07/2019   Pain in right knee 05/17/2019   Coronary artery disease involving coronary bypass graft of native heart without angina pectoris 09/19/2018   Weakness 08/01/2018   Mixed hyperlipidemia 06/06/2018   S/P left atrial appendage ligation 07/25/2017   S/P AVR 07/25/2017   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   Coronary artery disease without angina pectoris 07/12/2017   HFrEF (heart failure with reduced ejection fraction) (HCC) 07/07/2017   Mitral regurgitation 07/07/2017   (HFpEF) heart failure with preserved ejection fraction (HCC) 07/07/2017   Gout 02/12/2013   Benign hypertension 02/12/2013   GERD (gastroesophageal reflux disease) 02/12/2013    PCP: Leonarda Roxan BROCKS, NP  REFERRING PROVIDER: Gil Greig BRAVO, NP  REFERRING DIAG: R26.81 (ICD-10-CM) - Unstable gait R26.89 (ICD-10-CM) - Balance problem  THERAPY DIAG:  Difficulty in walking, not elsewhere classified  Unsteadiness on feet  Muscle weakness (generalized)  Cramp and spasm  Abnormal posture  Rationale for Evaluation and Treatment: Rehabilitation  ONSET DATE: 01/11/2024  SUBJECTIVE:   SUBJECTIVE STATEMENT: Patient reports no pain.  No complaints and no falls.   PERTINENT HISTORY: na PAIN:  01/19/24 Are you having pain? Yes: NPRS  scale: 0/10 Pain location: left knee Pain description: aching Aggravating factors: downhill or down steps Relieving factors: rest   PRECAUTIONS: Fall  RED FLAGS: None   WEIGHT BEARING RESTRICTIONS: No  FALLS:  Has patient fallen in last 6 months? Yes. 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  DIAGNOSTIC FINDINGS: na  PATIENT SURVEYS:  LEFS  Extreme difficulty/unable (0), Quite a bit of difficulty (1), Moderate difficulty (2), Little difficulty (3), No difficulty (4) Survey date:    Any of your usual work, housework or school activities   2. Usual hobbies, recreational or sporting activities   3. Getting into/out of the bath   4. Walking between rooms  5. Putting on socks/shoes   6. Squatting    7. Lifting an object, like a bag of groceries from the floor   8. Performing light activities around your home   9. Performing heavy activities around your home   10. Getting into/out of a car   11. Walking 2 blocks   12. Walking 1 mile   13. Going up/down 10 stairs (1 flight)   14. Standing for 1 hour   15.  sitting for 1 hour   16. Running on even ground   17. Running on uneven ground   18. Making sharp turns while running fast   19. Hopping    20. Rolling over in bed   Score total:  62/80     COGNITION: Overall  cognitive status: Within functional limits for tasks assessed     SENSATION: WFL   MUSCLE LENGTH: Hamstrings: Right 45 deg; Left 40 deg Thomas test: Right pos; Left pos  POSTURE: rounded shoulders and forward head  PALPATION: Mild crepitus   LOWER EXTREMITY ROM:  Right knee flexion : approx 110 degrees,  extension approx -20 Left knee flexion: approx 100 degrees, extension approx -20  LOWER EXTREMITY MMT:   4/5 extension bilaterally   4/5 flexion bilaterally  LOWER EXTREMITY SPECIAL TESTS:  Knee special tests: Patellafemoral grind test: negative  FUNCTIONAL TESTS:  5 times sit to stand: 12.03 sec Timed up and go (TUG): 13.15  GAIT: Distance walked: 30 feet Assistive device utilized: None Level of assistance: Complete Independence Comments: slight deviation on turns but otherwise good heel to toe gait                                                                                                                                TREATMENT DATE: 01/19/24 Nustep level 5 x 5 min (PT present to discuss status and progress) Lateral band walks (started with yellow but patient continued to say this was way too easy so we switched to black) he completed 3 laps (2 with yellow and last lap with black- this was a fairly difficult so he may do well with red loop) Seated clam with black loop x 20 Sit to stand x 10 without UE support Squat to chair x 10 without UE support  Seated LAQ with 6 lb aw 2 x 10 Seated march with 6 lb aw x 20 Seated hip ER with 6 lb aw 2 x 10 each LE Leg Press 80# 2 x 10 Ankle PF on leg press with 80# 2 x 10 Rocker board x 2 min  Rocker board gastroc stretch x 10 holding 10 sec  TREATMENT DATE: 01/17/24 Initial eval completed and initial HEP established Educated on fall prevention and fall risk   PATIENT EDUCATION:  Education details: Discussed appropriate level of exercise and reason for falls, fall prevention Person educated: Patient Education  method: Programmer, Multimedia, Facilities Manager, Verbal cues, and Handouts Education comprehension: verbalized understanding, returned demonstration, and  verbal cues required  HOME EXERCISE PROGRAM: Access Code: [email or web address] URL: [email or web address] Date: 01/17/2024 Prepared by: Delon Haddock  Exercises - Seated Knee Extension Stretch with Chair  - 1 x daily - 7 x weekly - 1 sets - 1-5 reps - Seated Hamstring Stretch  - 1 x daily - 7 x weekly - 1 sets - 3 reps - 30 sec hold  ASSESSMENT:  CLINICAL IMPRESSION: [place] was able to complete all tasks today.  He tends to want to be challenged beyond what is appropriate for his condition.  We discussed appropriate progress to avoid being overly sore.  He will likely feel some soreness from today and will need to scale back next visit.  Overall, he did fairly well and had no lob.  We focused mainly on hip and quad strength along with ankle mobility today.  He would benefit from continuing skilled PT for quad rehab, balance training, gait training and fall prevention.    OBJECTIVE IMPAIRMENTS: Abnormal gait, decreased balance, difficulty walking, decreased strength, hypomobility, increased fascial restrictions, increased muscle spasms, impaired flexibility, improper body mechanics, postural dysfunction, and pain.   ACTIVITY LIMITATIONS: carrying, lifting, standing, squatting, sleeping, stairs, transfers, bathing, toileting, dressing, hygiene/grooming, and caring for others  PARTICIPATION LIMITATIONS: meal prep, cleaning, laundry, interpersonal relationship, driving, shopping, and yard work  PERSONAL FACTORS: Age, Fitness, Past/current experiences, and 1-2 comorbidities: pacemaker Htn, CHF are also affecting patient's functional outcome.   REHAB POTENTIAL: Good  CLINICAL DECISION MAKING: Stable/uncomplicated  EVALUATION COMPLEXITY: Low   GOALS: Goals reviewed with patient? Yes  SHORT TERM GOALS: Target date: 02/14/2024  Pain report to be no  greater than 4/10  Baseline: Goal status: Met 01/19/24  2.  Patient will be independent with initial HEP  Baseline:  Goal status: INITIAL   LONG TERM GOALS: Target date: 03/13/2024  Patient to report pain no greater than 2/10  Baseline:  Goal status: INITIAL  2.  Patient to be independent with advanced HEP  Baseline:  Goal status: INITIAL  3.  BERG to improve by 2-3 points Baseline:  Goal status: INITIAL  4.  Functional tests to improve by 2-3 sec Baseline:  Goal status: INITIAL  5.  No falls during PT episode Baseline:  Goal status: INITIAL  6.  Patient to be able to ascend and descend steps without pain or no greater than 2/10  Baseline:  Goal status: INITIAL   PLAN:  PT FREQUENCY: 2x/week  PT DURATION: 8 weeks  PLANNED INTERVENTIONS: 97110-Therapeutic exercises, 97530- Therapeutic activity, 97112- Neuromuscular re-education, 97535- Self Care, [id number]- Manual therapy, [phone]- Gait training, [phone]- Canalith repositioning, [id number]- Aquatic Therapy, [phone]- Electrical stimulation (unattended), [phone]- Electrical stimulation (manual), [id number]- Vasopneumatic device, [id number]- Ultrasound, [id number]- Traction (mechanical), [id number]- Ionotophoresis 4mg /ml Dexamethasone , [id number] (1-2 muscles), [id number] (3+ muscles)- Dry Needling, Patient/Family education, Balance training, Stair training, Taping, Joint mobilization, Spinal mobilization, Vestibular training, DME instructions, Cryotherapy, and Moist heat  PLAN FOR NEXT SESSION: Nustep, progress quad rehab and balance training   Pura Picinich B. Adan Beal, PT 01/19/2024 10:18 AM [facility] [address], [address] [place], [place] [address] Phone # [phone] Fax [phone]

## 2024-01-24 ENCOUNTER — Ambulatory Visit

## 2024-01-24 DIAGNOSIS — R252 Cramp and spasm: Secondary | ICD-10-CM

## 2024-01-24 DIAGNOSIS — M6281 Muscle weakness (generalized): Secondary | ICD-10-CM

## 2024-01-24 DIAGNOSIS — R262 Difficulty in walking, not elsewhere classified: Secondary | ICD-10-CM

## 2024-01-24 DIAGNOSIS — R293 Abnormal posture: Secondary | ICD-10-CM

## 2024-01-24 DIAGNOSIS — R2681 Unsteadiness on feet: Secondary | ICD-10-CM

## 2024-01-24 NOTE — Therapy (Signed)
 OUTPATIENT PHYSICAL THERAPY LOWER EXTREMITY TREATMENT   Patient Name: [name] MRN: [id number] DOB:08-05-1940, 83 y.o., male Today's Date: 01/24/2024  END OF SESSION:  PT End of Session - 01/24/24 0809     Visit Number 3    Date for Recertification  03/13/24    Authorization Type [employer]    PT Start Time 0803    PT Stop Time 0845    PT Time Calculation (min) 42 min    Activity Tolerance Patient tolerated treatment well    Behavior During Therapy WFL for tasks assessed/performed          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   CHF (congestive heart failure) (HCC)    Elevated cholesterol    Gout    Hepatitis 1964   hepatitis A    HFrEF (heart failure with reduced ejection fraction) (HCC)    Hypertension    Moderate mitral regurgitation    Pre-diabetes    Severe aortic stenosis    Past Surgical History:  Procedure Laterality Date   AORTIC VALVE REPLACEMENT N/A 07/24/2017   Procedure: AORTIC VALVE REPLACEMENT (AVR) using 23mm Inspiris Aortic Valve;  Surgeon: Lucas Dorise POUR, MD;  Location: [facility] OR;  Service: Open Heart Surgery;  Laterality: N/A;   CLIPPING OF ATRIAL APPENDAGE N/A 07/24/2017   Procedure: CLIPPING OF ATRIAL APPENDAGE using a 50mm Atricure clip;  Surgeon: Lucas Dorise POUR, MD;  Location: [facility] OR;  Service: Open Heart Surgery;  Laterality: N/A;   CORONARY ARTERY BYPASS GRAFT N/A 07/24/2017   Procedure: CORONARY ARTERY BYPASS GRAFTING (CABG) times 3 using the left greater saphenous vein harvested endoscopically and left internal mammary artery.;  Surgeon: Lucas Dorise POUR, MD;  Location: [facility] OR;  Service: Open Heart Surgery;  Laterality: N/A;   EYE SURGERY Bilateral    lens replacements for cataracts   HERNIA REPAIR     INGUINAL HERNIA REPAIR Right 05/06/2015   Procedure: LAPAROSCOPIC RIGHT INGUINAL HERNIA WITH MESH;  Surgeon: Vicenta Poli, MD;  Location: [facility] ORS;  Service: General;   Laterality: Right;   INSERTION OF MESH Right 05/06/2015   Procedure: INSERTION OF MESH;  Surgeon: Vicenta Poli, MD;  Location: [facility] ORS;  Service: General;  Laterality: Right;   IR BONE MARROW BIOPSY & ASPIRATION  09/15/2022   RIGHT HEART CATH N/A 03/21/2023   Procedure: RIGHT HEART CATH;  Surgeon: Elmira Newman PARAS, MD;  Location: [facility] INVASIVE CV LAB;  Service: Cardiovascular;  Laterality: N/A;   RIGHT/LEFT HEART CATH AND CORONARY ANGIOGRAPHY N/A 07/10/2017   Procedure: RIGHT/LEFT HEART CATH AND CORONARY ANGIOGRAPHY;  Surgeon: Elmira Newman PARAS, MD;  Location: [facility] INVASIVE CV LAB;  Service: Cardiovascular;  Laterality: N/A;   TEE WITHOUT CARDIOVERSION N/A 07/11/2017   Procedure: TRANSESOPHAGEAL ECHOCARDIOGRAM (TEE);  Surgeon: Elmira Newman PARAS, MD;  Location: [facility] ENDOSCOPY;  Service: Cardiovascular;  Laterality: N/A;   TEE WITHOUT CARDIOVERSION N/A 07/24/2017   Procedure: TRANSESOPHAGEAL ECHOCARDIOGRAM (TEE);  Surgeon: Lucas Dorise POUR, MD;  Location: [facility] OR;  Service: Open Heart Surgery;  Laterality: N/A;   TEE WITHOUT CARDIOVERSION N/A 08/01/2022   Procedure: TRANSESOPHAGEAL ECHOCARDIOGRAM;  Surgeon: Pietro Redell RAMAN, MD;  Location: [facility] INVASIVE CV LAB;  Service: Cardiovascular;  Laterality: N/A;   TOTAL KNEE ARTHROPLASTY Right 08/07/2019   Procedure: TOTAL KNEE ARTHROPLASTY;  Surgeon: Melodi Lerner, MD;  Location: [facility] ORS;  Service: Orthopedics;  Laterality: Right;   ULTRASOUND GUIDANCE FOR VASCULAR ACCESS  07/10/2017   Procedure: Ultrasound Guidance For Vascular Access;  Surgeon: Elmira Newman PARAS, MD;  Location: [facility] INVASIVE CV LAB;  Service: Cardiovascular;;   Patient Active Problem List   Diagnosis Date Noted   Presence of heart assist device (HCC) 03/29/2023   Leukopenia 09/13/2022   PICC (peripherally inserted central catheter) in place 08/30/2022   Pure hypercholesterolemia 08/03/2022   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   Encounter for assessment of peripherally inserted  central catheter (PICC) 08/02/2022   Aortic valve endocarditis 08/01/2022   Cardiomyopathy (HCC) 08/01/2022   Chronic HFrEF (heart failure with reduced ejection fraction) (HCC) 08/01/2022   Prosthetic cardiac valve vegetation 08/01/2022   Anemia 08/01/2022   Streptococcal bacteremia 07/29/2022   Aortic atherosclerosis 07/29/2022   Medication management 07/29/2022   Bacteremia 07/29/2022   Low back pain 06/24/2022   Constipation 06/24/2022   Renal cyst, 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   PAC (premature atrial contraction) 03/01/2021   PVC (premature ventricular contraction) 03/01/2021   Atrial bigeminy 02/28/2020   OA (osteoarthritis) of knee 08/07/2019   Primary osteoarthritis of right knee 08/07/2019   Pain in right knee 05/17/2019   Coronary artery disease involving coronary bypass graft of native heart without angina pectoris 09/19/2018   Weakness 08/01/2018   Mixed hyperlipidemia 06/06/2018   S/P left atrial appendage ligation 07/25/2017   S/P AVR 07/25/2017   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   Coronary artery disease without angina pectoris 07/12/2017   HFrEF (heart failure with reduced ejection fraction) (HCC) 07/07/2017   Mitral regurgitation 07/07/2017   (HFpEF) heart failure with preserved ejection fraction (HCC) 07/07/2017   Gout 02/12/2013   Benign hypertension 02/12/2013   GERD (gastroesophageal reflux disease) 02/12/2013    PCP: Leonarda Roxan BROCKS, NP  REFERRING PROVIDER: Gil Greig BRAVO, NP  REFERRING DIAG: R26.81 (ICD-10-CM) - Unstable gait R26.89 (ICD-10-CM) - Balance problem  THERAPY DIAG:  Difficulty in walking, not elsewhere classified  Unsteadiness on feet  Muscle weakness (generalized)  Cramp and spasm  Abnormal posture  Rationale for Evaluation and Treatment: Rehabilitation  ONSET DATE: 01/11/2024  SUBJECTIVE:   SUBJECTIVE STATEMENT: Patient reports no pain and no soreness.   [facility]  PERTINENT HISTORY: na PAIN:  01/24/24 Are  you having pain? Yes: NPRS scale: 0/10 Pain location: left knee Pain description: aching Aggravating factors: downhill or down steps Relieving factors: rest   PRECAUTIONS: Fall  RED FLAGS: None   WEIGHT BEARING RESTRICTIONS: No  FALLS:  Has patient fallen in last 6 months? Yes. 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  DIAGNOSTIC FINDINGS: na  PATIENT SURVEYS:  LEFS  Extreme difficulty/unable (0), Quite a bit of difficulty (1), Moderate difficulty (2), Little difficulty (3), No difficulty (4) Survey date:    Any of your usual work, housework or school activities   2. Usual hobbies, recreational or sporting activities   3. Getting into/out of  the bath   4. Walking between rooms   5. Putting on socks/shoes   6. Squatting    7. Lifting an object, like a bag of groceries from the floor   8. Performing light activities around your home   9. Performing heavy activities around your home   10. Getting into/out of a car   11. Walking 2 blocks   12. Walking 1 mile   13. Going up/down 10 stairs (1 flight)   14. Standing for 1 hour   15.  sitting for 1 hour   16. Running on even ground   17. Running on uneven ground   18. Making sharp turns while running fast   19. Hopping    20. Rolling over in bed   Score total:  62/80      COGNITION: Overall cognitive status: Within functional limits for tasks assessed     SENSATION: WFL   MUSCLE LENGTH: Hamstrings: Right 45 deg; Left 40 deg Thomas test: Right pos; Left pos  POSTURE: rounded shoulders and forward head  PALPATION: Mild crepitus   LOWER EXTREMITY ROM:  Right knee flexion : approx 110 degrees,  extension approx -20 Left knee flexion: approx 100 degrees, extension approx -20  LOWER EXTREMITY MMT:   4/5 extension bilaterally   4/5 flexion bilaterally  LOWER EXTREMITY SPECIAL TESTS:  Knee special tests: Patellafemoral grind test: negative  FUNCTIONAL TESTS:  5 times sit to stand: 12.03 sec Timed up and go (TUG): 13.15  GAIT: Distance walked: 30 feet Assistive device utilized: None Level of assistance: Complete Independence Comments: slight deviation on turns but otherwise good heel to toe gait                                                                                                                                TREATMENT DATE: 01/24/24 Nustep level 5 x 5 min (PT present to discuss status and progress) Standing hamstring stretch 2 x 30 sec on each LE Standing quad stretch 2 x 30 sec on each LE Lateral band walks with red loop x 3 laps (unable to get much seperation  Standing in tandem on balance pads 2 x 30 sec each LE in front with min assist to [employer] board x 2 min Step up and hold x 10 each LE fwd then x 10 each LE lateral with min to cga Leg Press 90# 2 x 10   TREATMENT DATE: 01/19/24 Nustep level 5 x 5 min (PT present to discuss status and progress) Lateral band walks (started with yellow but patient continued to say this was way too easy so we switched to black) he completed 3 laps (2 with yellow and last lap with black- this was a fairly difficult so he may do well with red loop) Seated clam with black loop x 20 Sit to stand x 10 without UE support Squat to chair x 10 without UE support  Seated LAQ with  6 lb aw  2 x 10 Seated march with 6 lb aw x 20 Seated hip ER with 6 lb aw 2 x 10 each LE Leg Press 80# 2 x 10 Ankle PF on leg press with 80# 2 x 10 Rocker board x 2 min  Rocker board gastroc stretch x 10 holding 10 sec  TREATMENT DATE: 01/17/24 Initial eval completed and initial HEP established Educated on fall prevention and fall risk   PATIENT EDUCATION:  Education details: Discussed appropriate level of exercise and reason for falls, fall prevention Person educated: Patient Education method: Programmer, Multimedia, Demonstration, Verbal cues, and Handouts Education comprehension: verbalized understanding, returned demonstration, and verbal cues required  HOME EXERCISE PROGRAM: Access Code: [email or web address] URL: [email or web address] Date: 01/17/2024 Prepared by: Delon Haddock  Exercises - Seated Knee Extension Stretch with Chair  - 1 x daily - 7 x weekly - 1 sets - 1-5 reps - Seated Hamstring Stretch  - 1 x daily - 7 x weekly - 1 sets - 3 reps - 30 sec hold  ASSESSMENT:  CLINICAL IMPRESSION: [place] was quite challenged with the balance tasks assigned today.  However, these were moderate level balance challenges.  He has quite a bit of flexibility limitation throughout the hips and ankles which affect his balance.  He is well motivated and compliant.   He would benefit from continuing skilled PT for quad rehab, balance training, gait training and fall prevention.    OBJECTIVE IMPAIRMENTS: Abnormal gait, decreased balance, difficulty walking, decreased strength, hypomobility, increased fascial restrictions, increased muscle spasms, impaired flexibility, improper body mechanics, postural dysfunction, and pain.   ACTIVITY LIMITATIONS: carrying, lifting, standing, squatting, sleeping, stairs, transfers, bathing, toileting, dressing, hygiene/grooming, and caring for others  PARTICIPATION LIMITATIONS: meal prep, cleaning, laundry, interpersonal relationship, driving, shopping, and yard  work  PERSONAL FACTORS: Age, Fitness, Past/current experiences, and 1-2 comorbidities: pacemaker Htn, CHF are also affecting patient's functional outcome.   REHAB POTENTIAL: Good  CLINICAL DECISION MAKING: Stable/uncomplicated  EVALUATION COMPLEXITY: Low   GOALS: Goals reviewed with patient? Yes  SHORT TERM GOALS: Target date: 02/14/2024  Pain report to be no greater than 4/10  Baseline: Goal status: Met 01/19/24  2.  Patient will be independent with initial HEP  Baseline:  Goal status: INITIAL   LONG TERM GOALS: Target date: 03/13/2024  Patient to report pain no greater than 2/10  Baseline:  Goal status: INITIAL  2.  Patient to be independent with advanced HEP  Baseline:  Goal status: INITIAL  3.  BERG to improve by 2-3 points Baseline:  Goal status: INITIAL  4.  Functional tests to improve by 2-3 sec Baseline:  Goal status: INITIAL  5.  No falls during PT episode Baseline:  Goal status: INITIAL  6.  Patient to be able to ascend and descend steps without pain or no greater than 2/10  Baseline:  Goal status: INITIAL   PLAN:  PT FREQUENCY: 2x/week  PT DURATION: 8 weeks  PLANNED INTERVENTIONS: 97110-Therapeutic exercises, 97530- Therapeutic activity, 97112- Neuromuscular re-education, 97535- Self Care, 02859- Manual therapy, [phone]- Gait training, [phone]- Canalith repositioning, [id number]- Aquatic Therapy, [phone]- Electrical stimulation (unattended), [phone]- Electrical stimulation (manual), [id number]- Vasopneumatic device, [id number]- Ultrasound, [id number]- Traction (mechanical), [id number]- Ionotophoresis 4mg /ml Dexamethasone , [id number] (1-2 muscles), [id number] (3+ muscles)- Dry Needling, Patient/Family education, Balance training, Stair training, Taping, Joint mobilization, Spinal mobilization, Vestibular training, DME instructions, Cryotherapy, and Moist heat  PLAN FOR NEXT SESSION: Nustep, progress quad rehab and balance training   Bindu Docter B. Elleen Coulibaly, PT 01/24/2024 4:27 PM [facility] [address], [address] [place], [place] [address] Phone # [phone] Fax [phone]

## 2024-01-25 ENCOUNTER — Ambulatory Visit

## 2024-01-25 ENCOUNTER — Telehealth: Payer: Self-pay

## 2024-01-25 NOTE — Telephone Encounter (Signed)
 Call placed to patient to inquire about no show.  Patient answered.  He thought that his appointment was tomorrow.  I got my days mixed up. He apologized and confirms he will be here for his next appt.

## 2024-01-30 ENCOUNTER — Ambulatory Visit

## 2024-01-30 DIAGNOSIS — R262 Difficulty in walking, not elsewhere classified: Secondary | ICD-10-CM | POA: Diagnosis not present

## 2024-01-30 DIAGNOSIS — R2681 Unsteadiness on feet: Secondary | ICD-10-CM

## 2024-01-30 DIAGNOSIS — M6281 Muscle weakness (generalized): Secondary | ICD-10-CM

## 2024-01-30 DIAGNOSIS — R293 Abnormal posture: Secondary | ICD-10-CM

## 2024-01-30 DIAGNOSIS — R252 Cramp and spasm: Secondary | ICD-10-CM

## 2024-01-30 NOTE — Therapy (Signed)
 " OUTPATIENT PHYSICAL THERAPY LOWER EXTREMITY TREATMENT   Patient Name: [name] MRN: [id number] DOB:Feb 02, 1941, 83 y.o., male Today's Date: 01/31/2024  END OF SESSION:  PT End of Session - 01/30/24 1408     Visit Number 4    Date for Recertification  03/13/24    Authorization Type [employer]    PT Start Time 1404    PT Stop Time 1445    PT Time Calculation (min) 41 min    Activity Tolerance Patient tolerated treatment well    Behavior During Therapy WFL for tasks assessed/performed          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   CHF (congestive heart failure) (HCC)    Elevated cholesterol    Gout    Hepatitis 1964   hepatitis A    HFrEF (heart failure with reduced ejection fraction) (HCC)    Hypertension    Moderate mitral regurgitation    Pre-diabetes    Severe aortic stenosis    Past Surgical History:  Procedure Laterality Date   AORTIC VALVE REPLACEMENT N/A 07/24/2017   Procedure: AORTIC VALVE REPLACEMENT (AVR) using 23mm Inspiris Aortic Valve;  Surgeon: Lucas Dorise POUR, MD;  Location: [facility] OR;  Service: Open Heart Surgery;  Laterality: N/A;   CLIPPING OF ATRIAL APPENDAGE N/A 07/24/2017   Procedure: CLIPPING OF ATRIAL APPENDAGE using a 50mm Atricure clip;  Surgeon: Lucas Dorise POUR, MD;  Location: [facility] OR;  Service: Open Heart Surgery;  Laterality: N/A;   CORONARY ARTERY BYPASS GRAFT N/A 07/24/2017   Procedure: CORONARY ARTERY BYPASS GRAFTING (CABG) times 3 using the left greater saphenous vein harvested endoscopically and left internal mammary artery.;  Surgeon: Lucas Dorise POUR, MD;  Location: [facility] OR;  Service: Open Heart Surgery;  Laterality: N/A;   EYE SURGERY Bilateral    lens replacements for cataracts   HERNIA REPAIR     INGUINAL HERNIA REPAIR Right 05/06/2015   Procedure: LAPAROSCOPIC RIGHT INGUINAL HERNIA WITH MESH;  Surgeon: Vicenta Poli, MD;  Location: [facility] ORS;  Service: General;   Laterality: Right;   INSERTION OF MESH Right 05/06/2015   Procedure: INSERTION OF MESH;  Surgeon: Vicenta Poli, MD;  Location: [facility] ORS;  Service: General;  Laterality: Right;   IR BONE MARROW BIOPSY & ASPIRATION  09/15/2022   RIGHT HEART CATH N/A 03/21/2023   Procedure: RIGHT HEART CATH;  Surgeon: Elmira Newman PARAS, MD;  Location: [facility] INVASIVE CV LAB;  Service: Cardiovascular;  Laterality: N/A;   RIGHT/LEFT HEART CATH AND CORONARY ANGIOGRAPHY N/A 07/10/2017   Procedure: RIGHT/LEFT HEART CATH AND CORONARY ANGIOGRAPHY;  Surgeon: Elmira Newman PARAS, MD;  Location: [facility] INVASIVE CV LAB;  Service: Cardiovascular;  Laterality: N/A;   TEE WITHOUT CARDIOVERSION N/A 07/11/2017   Procedure: TRANSESOPHAGEAL ECHOCARDIOGRAM (TEE);  Surgeon: Elmira Newman PARAS, MD;  Location: [facility] ENDOSCOPY;  Service: Cardiovascular;  Laterality: N/A;   TEE WITHOUT CARDIOVERSION N/A 07/24/2017   Procedure: TRANSESOPHAGEAL ECHOCARDIOGRAM (TEE);  Surgeon: Lucas Dorise POUR, MD;  Location: [facility] OR;  Service: Open Heart Surgery;  Laterality: N/A;   TEE WITHOUT CARDIOVERSION N/A 08/01/2022   Procedure: TRANSESOPHAGEAL ECHOCARDIOGRAM;  Surgeon: Pietro Redell RAMAN, MD;  Location: [facility] INVASIVE CV LAB;  Service: Cardiovascular;  Laterality: N/A;   TOTAL KNEE ARTHROPLASTY Right 08/07/2019   Procedure: TOTAL KNEE ARTHROPLASTY;  Surgeon: Melodi Lerner, MD;  Location: [facility] ORS;  Service: Orthopedics;  Laterality: Right;   ULTRASOUND GUIDANCE FOR VASCULAR  ACCESS  07/10/2017   Procedure: Ultrasound Guidance For Vascular Access;  Surgeon: Elmira Newman PARAS, MD;  Location: [facility] INVASIVE CV LAB;  Service: Cardiovascular;;   Patient Active Problem List   Diagnosis Date Noted   Presence of heart assist device (HCC) 03/29/2023   Leukopenia 09/13/2022   PICC (peripherally inserted central catheter) in place 08/30/2022   Pure hypercholesterolemia 08/03/2022   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   Encounter for assessment of peripherally inserted  central catheter (PICC) 08/02/2022   Aortic valve endocarditis 08/01/2022   Cardiomyopathy (HCC) 08/01/2022   Chronic HFrEF (heart failure with reduced ejection fraction) (HCC) 08/01/2022   Prosthetic cardiac valve vegetation 08/01/2022   Anemia 08/01/2022   Streptococcal bacteremia 07/29/2022   Aortic atherosclerosis 07/29/2022   Medication management 07/29/2022   Bacteremia 07/29/2022   Low back pain 06/24/2022   Constipation 06/24/2022   Renal cyst, 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   PAC (premature atrial contraction) 03/01/2021   PVC (premature ventricular contraction) 03/01/2021   Atrial bigeminy 02/28/2020   OA (osteoarthritis) of knee 08/07/2019   Primary osteoarthritis of right knee 08/07/2019   Pain in right knee 05/17/2019   Coronary artery disease involving coronary bypass graft of native heart without angina pectoris 09/19/2018   Weakness 08/01/2018   Mixed hyperlipidemia 06/06/2018   S/P left atrial appendage ligation 07/25/2017   S/P AVR 07/25/2017   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   Coronary artery disease without angina pectoris 07/12/2017   HFrEF (heart failure with reduced ejection fraction) (HCC) 07/07/2017   Mitral regurgitation 07/07/2017   (HFpEF) heart failure with preserved ejection fraction (HCC) 07/07/2017   Gout 02/12/2013   Benign hypertension 02/12/2013   GERD (gastroesophageal reflux disease) 02/12/2013    PCP: Leonarda Roxan BROCKS, NP  REFERRING PROVIDER: Gil Greig BRAVO, NP  REFERRING DIAG: R26.81 (ICD-10-CM) - Unstable gait R26.89 (ICD-10-CM) - Balance problem  THERAPY DIAG:  Difficulty in walking, not elsewhere classified  Unsteadiness on feet  Muscle weakness (generalized)  Cramp and spasm  Abnormal posture  Rationale for Evaluation and Treatment: Rehabilitation  ONSET DATE: 01/11/2024  SUBJECTIVE:   SUBJECTIVE STATEMENT: Patient reports no falls.  I feel like when I do lose my balance, I am able to catch myself easier.    PERTINENT HISTORY: na PAIN:   01/30/24 Are you having pain? Yes: NPRS scale: 0/10 Pain location: left knee Pain description: aching Aggravating factors: downhill or down steps Relieving factors: rest   PRECAUTIONS: Fall  RED FLAGS: None   WEIGHT BEARING RESTRICTIONS: No  FALLS:  Has patient fallen in last 6 months? Yes. 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  DIAGNOSTIC FINDINGS: na  PATIENT SURVEYS:  LEFS  Extreme difficulty/unable (0), Quite a bit of difficulty (1), Moderate difficulty (2), Little difficulty (3), No difficulty (4) Survey date:    Any of your usual work, housework or school activities   2. Usual hobbies, recreational or sporting activities  3. Getting into/out of the bath   4. Walking between rooms   5. Putting on socks/shoes   6. Squatting    7. Lifting an object, like a bag of groceries from the floor   8. Performing light activities around your home   9. Performing heavy activities around your home   10. Getting into/out of a car   11. Walking 2 blocks   12. Walking 1 mile   13. Going up/down 10 stairs (1 flight)   14. Standing for 1 hour   15.  sitting for 1 hour   16. Running on even ground   17. Running on uneven ground   18. Making sharp turns while running fast   19. Hopping    20. Rolling over in bed   Score  total:  62/80     COGNITION: Overall cognitive status: Within functional limits for tasks assessed     SENSATION: WFL   MUSCLE LENGTH: Hamstrings: Right 45 deg; Left 40 deg Thomas test: Right pos; Left pos  POSTURE: rounded shoulders and forward head  PALPATION: Mild crepitus   LOWER EXTREMITY ROM:  Right knee flexion : approx 110 degrees,  extension approx -20 Left knee flexion: approx 100 degrees, extension approx -20  LOWER EXTREMITY MMT:   4/5 extension bilaterally   4/5 flexion bilaterally  LOWER EXTREMITY SPECIAL TESTS:  Knee special tests: Patellafemoral grind test: negative  FUNCTIONAL TESTS:  5 times sit to stand: 12.03 sec Timed up and go (TUG): 13.15  GAIT: Distance walked: 30 feet Assistive device utilized: None Level of assistance: Complete Independence Comments: slight deviation on turns but otherwise good heel to toe gait                                                                                                                                TREATMENT DATE: 01/30/24 Nustep level 5 x 5 min (PT present to discuss status and progress) Standing hamstring stretch 2 x 30 sec on each LE Standing quad stretch 2 x 30 sec on each LE Lateral band walks with red loop x 3 laps (less vc's needed for proper step width but  Standing in tandem on balance pads 2 x 1 min sec each LE in front with min assist to [employer] board x 2 min Step up and hold x 10 each LE fwd then x 10 each LE lateral with min to cga Cone touches 3 x 10 each LE  TREATMENT DATE: 01/24/24 Nustep level 5 x 5 min (PT present to discuss status and progress) Standing hamstring stretch 2 x 30 sec on each LE Standing quad stretch 2 x 30 sec on each LE Lateral band walks with red loop x 3 laps (unable to get much seperation  Standing in tandem on balance pads 2 x 30 sec each LE in front with min assist to [employer] board x 2 min Step up and hold x 10  each LE fwd then x 10 each LE lateral  with min to cga Leg Press 90# 2 x 10   TREATMENT DATE: 01/19/24 Nustep level 5 x 5 min (PT present to discuss status and progress) Lateral band walks (started with yellow but patient continued to say this was way too easy so we switched to black) he completed 3 laps (2 with yellow and last lap with black- this was a fairly difficult so he may do well with red loop) Seated clam with black loop x 20 Sit to stand x 10 without UE support Squat to chair x 10 without UE support  Seated LAQ with 6 lb aw 2 x 10 Seated march with 6 lb aw x 20 Seated hip ER with 6 lb aw 2 x 10 each LE Leg Press 80# 2 x 10 Ankle PF on leg press with 80# 2 x 10 Rocker board x 2 min  Rocker board gastroc stretch x 10 holding 10 sec  TREATMENT DATE: 01/17/24 Initial eval completed and initial HEP established Educated on fall prevention and fall risk   PATIENT EDUCATION:  Education details: Discussed appropriate level of exercise and reason for falls, fall prevention Person educated: Patient Education method: Programmer, Multimedia, Demonstration, Verbal cues, and Handouts Education comprehension: verbalized understanding, returned demonstration, and verbal cues required  HOME EXERCISE PROGRAM: Access Code: [email or web address] URL: [email or web address] Date: 01/30/2024 Prepared by: Delon Haddock  Exercises - Standing Hamstring Stretch on Chair  - 1 x daily - 7 x weekly - 1 sets - 3 reps - 30 sec hold - Quadricep Stretch with Chair and Counter Support  - 1 x daily - 7 x weekly - 1 sets - 3 reps - 30 sec hold - Seated Figure 4 Piriformis Stretch  - 1 x daily - 7 x weekly - 1 sets - 3 reps - 30 sed hold - Sidelying Open Book Thoracic Lumbar Rotation and Extension  - 1 x daily - 7 x weekly - 1 sets - 10 reps - 5 sec hold - Single Leg Cone Touch  - 1 x daily - 7 x weekly - 3 sets - 10 reps - Side Stepping with Resistance at Ankles  - 1 x daily - 7 x weekly - 3 sets - 10 reps  ASSESSMENT:  CLINICAL  IMPRESSION: [place] is progressing appropriately. He enjoys being challenged.  He was able to do tandem stance on balance pads with slightly less assist but continues to struggle with step up and hold.   He is well motivated and compliant.   He would benefit from continuing skilled PT for quad rehab, balance training, gait training and fall prevention.    OBJECTIVE IMPAIRMENTS: Abnormal gait, decreased balance, difficulty walking, decreased strength, hypomobility, increased fascial restrictions, increased muscle spasms, impaired flexibility, improper body mechanics, postural dysfunction, and pain.   ACTIVITY LIMITATIONS: carrying, lifting, standing, squatting, sleeping, stairs, transfers, bathing, toileting, dressing, hygiene/grooming, and caring for others  PARTICIPATION LIMITATIONS: meal prep, cleaning, laundry, interpersonal relationship, driving, shopping, and yard work  PERSONAL FACTORS: Age, Fitness, Past/current experiences, and 1-2 comorbidities: pacemaker Htn, CHF are also affecting patient's functional outcome.   REHAB POTENTIAL: Good  CLINICAL DECISION MAKING: Stable/uncomplicated  EVALUATION COMPLEXITY: Low   GOALS: Goals reviewed with patient? Yes  SHORT TERM GOALS: Target date: 02/14/2024  Pain report to be no greater than 4/10  Baseline: Goal status: Met 01/19/24  2.  Patient will be independent with initial HEP  Baseline:  Goal status: MET 01/30/24   LONG TERM  GOALS: Target date: 03/13/2024  Patient to report pain no greater than 2/10  Baseline:  Goal status: MET 01/30/24  2.  Patient to be independent with advanced HEP  Baseline:  Goal status: In progress  3.  BERG to improve by 2-3 points Baseline:  Goal status: In progress  4.  Functional tests to improve by 2-3 sec Baseline:  Goal status: In progress  5.  No falls during PT episode Baseline:  Goal status: In progress  6.  Patient to be able to ascend and descend steps without pain or no greater than  2/10  Baseline:  Goal status: In progress   PLAN:  PT FREQUENCY: 2x/week  PT DURATION: 8 weeks  PLANNED INTERVENTIONS: 97110-Therapeutic exercises, 97530- Therapeutic activity, [id number]- Neuromuscular re-education, 97535- Self Care, [id number]- Manual therapy, [phone]- Gait training, [phone]- Canalith repositioning, [id number]- Aquatic Therapy, [phone]- Electrical stimulation (unattended), [phone]- Electrical stimulation (manual), [id number]- Vasopneumatic device, [id number]- Ultrasound, [id number]- Traction (mechanical), [id number]- Ionotophoresis 4mg /ml Dexamethasone , [id number] (1-2 muscles), [id number] (3+ muscles)- Dry Needling, Patient/Family education, Balance training, Stair training, Taping, Joint mobilization, Spinal mobilization, Vestibular training, DME instructions, Cryotherapy, and Moist heat  PLAN FOR NEXT SESSION: Nustep, add in carioca and hurdles, progress quad rehab and balance training   Lamees Gable B. Dimitria Ketchum, PT 01/31/2024 8:14 AM [facility] [address] [place], [place] [address] Phone # [phone] Fax [phone]  "

## 2024-02-03 ENCOUNTER — Encounter: Payer: Self-pay | Admitting: Cardiology

## 2024-02-05 MED ORDER — PROBIOTIC PO TABS: 1.0000 | ORAL_TABLET | Freq: Every day | ORAL | Status: AC

## 2024-02-09 ENCOUNTER — Ambulatory Visit: Attending: Orthopedic Surgery

## 2024-02-09 DIAGNOSIS — R252 Cramp and spasm: Secondary | ICD-10-CM | POA: Diagnosis present

## 2024-02-09 DIAGNOSIS — M6281 Muscle weakness (generalized): Secondary | ICD-10-CM | POA: Diagnosis present

## 2024-02-09 DIAGNOSIS — R262 Difficulty in walking, not elsewhere classified: Secondary | ICD-10-CM | POA: Diagnosis present

## 2024-02-09 DIAGNOSIS — R293 Abnormal posture: Secondary | ICD-10-CM | POA: Insufficient documentation

## 2024-02-09 DIAGNOSIS — R2681 Unsteadiness on feet: Secondary | ICD-10-CM | POA: Diagnosis present

## 2024-02-09 NOTE — Therapy (Signed)
 " OUTPATIENT PHYSICAL THERAPY LOWER EXTREMITY TREATMENT   Patient Name: [name] MRN: [id number] DOB:1941/01/08, 84 y.o., male Today's Date: 02/09/2024  END OF SESSION:  PT End of Session - 02/09/24 0933     Visit Number 5    Date for Recertification  03/13/24    Authorization Type [employer]    PT Start Time [phone]    PT Stop Time 1020    PT Time Calculation (min) 47 min    Activity Tolerance Patient tolerated treatment well    Behavior During Therapy WFL for tasks assessed/performed          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   CHF (congestive heart failure) (HCC)    Elevated cholesterol    Gout    Hepatitis 1964   hepatitis A    HFrEF (heart failure with reduced ejection fraction) (HCC)    Hypertension    Moderate mitral regurgitation    Pre-diabetes    Severe aortic stenosis    Past Surgical History:  Procedure Laterality Date   AORTIC VALVE REPLACEMENT N/A 07/24/2017   Procedure: AORTIC VALVE REPLACEMENT (AVR) using 23mm Inspiris Aortic Valve;  Surgeon: Lucas Dorise POUR, MD;  Location: [facility] OR;  Service: Open Heart Surgery;  Laterality: N/A;   CLIPPING OF ATRIAL APPENDAGE N/A 07/24/2017   Procedure: CLIPPING OF ATRIAL APPENDAGE using a 50mm Atricure clip;  Surgeon: Lucas Dorise POUR, MD;  Location: [facility] OR;  Service: Open Heart Surgery;  Laterality: N/A;   CORONARY ARTERY BYPASS GRAFT N/A 07/24/2017   Procedure: CORONARY ARTERY BYPASS GRAFTING (CABG) times 3 using the left greater saphenous vein harvested endoscopically and left internal mammary artery.;  Surgeon: Lucas Dorise POUR, MD;  Location: [facility] OR;  Service: Open Heart Surgery;  Laterality: N/A;   EYE SURGERY Bilateral    lens replacements for cataracts   HERNIA REPAIR     INGUINAL HERNIA REPAIR Right 05/06/2015   Procedure: LAPAROSCOPIC RIGHT INGUINAL HERNIA WITH MESH;  Surgeon: Vicenta Poli, MD;  Location: [facility] ORS;  Service: General;   Laterality: Right;   INSERTION OF MESH Right 05/06/2015   Procedure: INSERTION OF MESH;  Surgeon: Vicenta Poli, MD;  Location: [facility]S;  Service: General;  Laterality: Right;   IR BONE MARROW BIOPSY & ASPIRATION  09/15/2022   RIGHT HEART CATH N/A 03/21/2023   Procedure: RIGHT HEART CATH;  Surgeon: Elmira Newman PARAS, MD;  Location: [facility] INVASIVE CV LAB;  Service: Cardiovascular;  Laterality: N/A;   RIGHT/LEFT HEART CATH AND CORONARY ANGIOGRAPHY N/A 07/10/2017   Procedure: RIGHT/LEFT HEART CATH AND CORONARY ANGIOGRAPHY;  Surgeon: Elmira Newman PARAS, MD;  Location: [facility];  Service: Cardiovascular;  Laterality: N/A;   TEE WITHOUT CARDIOVERSION N/A 07/11/2017   Procedure: TRANSESOPHAGEAL ECHOCARDIOGRAM (TEE);  Surgeon: Elmira Newman PARAS, MD;  Location: [facility] ENDOSCOPY;  Service: Cardiovascular;  Laterality: N/A;   TEE WITHOUT CARDIOVERSION N/A 07/24/2017   Procedure: TRANSESOPHAGEAL ECHOCARDIOGRAM (TEE);  Surgeon: Lucas Dorise POUR, MD;  Location: [facility] OR;  Service: Open Heart Surgery;  Laterality: N/A;   TEE WITHOUT CARDIOVERSION N/A 08/01/2022   Procedure: TRANSESOPHAGEAL ECHOCARDIOGRAM;  Surgeon: Pietro Redell RAMAN, MD;  Location: [facility] INVASIVE CV LAB;  Service: Cardiovascular;  Laterality: N/A;   TOTAL KNEE ARTHROPLASTY Right 08/07/2019   Procedure: TOTAL KNEE ARTHROPLASTY;  Surgeon: Melodi Lerner, MD;  Location: [facility] ORS;  Service: Orthopedics;  Laterality: Right;   ULTRASOUND GUIDANCE FOR VASCULAR  ACCESS  07/10/2017   Procedure: Ultrasound Guidance For Vascular Access;  Surgeon: Elmira Newman PARAS, MD;  Location: [facility] INVASIVE CV LAB;  Service: Cardiovascular;;   Patient Active Problem List   Diagnosis Date Noted   Presence of heart assist device (HCC) 03/29/2023   Leukopenia 09/13/2022   PICC (peripherally inserted central catheter) in place 08/30/2022   Pure hypercholesterolemia 08/03/2022   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   Encounter for assessment of peripherally inserted  central catheter (PICC) 08/02/2022   Aortic valve endocarditis 08/01/2022   Cardiomyopathy (HCC) 08/01/2022   Chronic HFrEF (heart failure with reduced ejection fraction) (HCC) 08/01/2022   Prosthetic cardiac valve vegetation 08/01/2022   Anemia 08/01/2022   Streptococcal bacteremia 07/29/2022   Aortic atherosclerosis 07/29/2022   Medication management 07/29/2022   Bacteremia 07/29/2022   Low back pain 06/24/2022   Constipation 06/24/2022   Renal cyst, 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   PAC (premature atrial contraction) 03/01/2021   PVC (premature ventricular contraction) 03/01/2021   Atrial bigeminy 02/28/2020   OA (osteoarthritis) of knee 08/07/2019   Primary osteoarthritis of right knee 08/07/2019   Pain in right knee 05/17/2019   Coronary artery disease involving coronary bypass graft of native heart without angina pectoris 09/19/2018   Weakness 08/01/2018   Mixed hyperlipidemia 06/06/2018   S/P left atrial appendage ligation 07/25/2017   S/P AVR 07/25/2017   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   Coronary artery disease without angina pectoris 07/12/2017   HFrEF (heart failure with reduced ejection fraction) (HCC) 07/07/2017   Mitral regurgitation 07/07/2017   (HFpEF) heart failure with preserved ejection fraction (HCC) 07/07/2017   Gout 02/12/2013   Benign hypertension 02/12/2013   GERD (gastroesophageal reflux disease) 02/12/2013    PCP: Leonarda Roxan BROCKS, NP  REFERRING PROVIDER: Gil Greig BRAVO, NP  REFERRING DIAG: R26.81 (ICD-10-CM) - Unstable gait R26.89 (ICD-10-CM) - Balance problem  THERAPY DIAG:  Difficulty in walking, not elsewhere classified  Unsteadiness on feet  Muscle weakness (generalized)  Cramp and spasm  Abnormal posture  Rationale for Evaluation and Treatment: Rehabilitation  ONSET DATE: 01/11/2024  SUBJECTIVE:   SUBJECTIVE STATEMENT: Patient reports doing well.  I feel a lot more stable.  I feel like this is really helping.    PERTINENT HISTORY: na PAIN:   02/09/24 Are you having pain? Yes: NPRS scale: 0/10 Pain location: left knee Pain description: aching Aggravating factors: downhill or down steps Relieving factors: rest   PRECAUTIONS: Fall  RED FLAGS: None   WEIGHT BEARING RESTRICTIONS: No  FALLS:  Has patient fallen in last 6 months? Yes. 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  DIAGNOSTIC FINDINGS: na  PATIENT SURVEYS:  LEFS  Extreme difficulty/unable (0), Quite a bit of difficulty (1), Moderate difficulty (2), Little difficulty (3), No difficulty (4) Survey date:    Any of your usual work, housework or school activities   2. Usual hobbies, recreational or sporting activities  3. Getting into/out of the bath   4. Walking between rooms   5. Putting on socks/shoes   6. Squatting    7. Lifting an object, like a bag of groceries from the floor   8. Performing light activities around your home   9. Performing heavy activities around your home   10. Getting into/out of a car   11. Walking 2 blocks   12. Walking 1 mile   13. Going up/down 10 stairs (1 flight)   14. Standing for 1 hour   15.  sitting for 1 hour   16. Running on even ground   17. Running on uneven ground   18. Making sharp turns while running fast   19. Hopping    20. Rolling over in bed   Score total:   62/80     COGNITION: Overall cognitive status: Within functional limits for tasks assessed     SENSATION: WFL   MUSCLE LENGTH: Hamstrings: Right 45 deg; Left 40 deg Thomas test: Right pos; Left pos  POSTURE: rounded shoulders and forward head  PALPATION: Mild crepitus   LOWER EXTREMITY ROM:  Right knee flexion : approx 110 degrees,  extension approx -20 Left knee flexion: approx 100 degrees, extension approx -20  LOWER EXTREMITY MMT:   4/5 extension bilaterally   4/5 flexion bilaterally  LOWER EXTREMITY SPECIAL TESTS:  Knee special tests: Patellafemoral grind test: negative  FUNCTIONAL TESTS:  5 times sit to stand: 12.03 sec Timed up and go (TUG): 13.15  GAIT: Distance walked: 30 feet Assistive device utilized: None Level of assistance: Complete Independence Comments: slight deviation on turns but otherwise good heel to toe gait                                                                                                                                TREATMENT DATE: 02/09/24 Nustep level 5 x 5 min (PT present to discuss status and progress) [place]arioca x 3 laps of 15 feet Heel to toe on line x 15 feet x 3 laps Fast feet fwd x 20 Fast feet in tandem side to side x 10 each with each leg in front  Hurdle step through fwd gait x 3 laps Hurdle side step to gait x 3 laps Step up and hold x 10 each LE fwd and x 10 each LE lateral using balance pad Hip matrix with 40 lbs hip abduction and extension 2 x 10 each Squat to 16 side of rit fit box 2 x 10  TREATMENT DATE: 01/30/24 Nustep level 5 x 5 min (PT present to discuss status and progress) Standing hamstring stretch 2 x 30 sec on each LE Standing quad stretch 2 x 30 sec on each LE Lateral band walks with red loop x 3 laps (less vc's needed for proper step width but  Standing in tandem on balance pads 2 x 1 min sec each LE in front with min assist to  cga Rocker board x 2 min Step up and hold x 10 each LE fwd then x  10 each LE lateral with min to cga Cone touches 3 x 10 each LE  TREATMENT DATE: 01/24/24 Nustep level 5 x 5 min (PT present to discuss status and progress) Standing hamstring stretch 2 x 30 sec on each LE Standing quad stretch 2 x 30 sec on each LE Lateral band walks with red loop x 3 laps (unable to get much seperation  Standing in tandem on balance pads 2 x 30 sec each LE in front with min assist to [employer] board x 2 min Step up and hold x 10 each LE fwd then x 10 each LE lateral with min to cga Leg Press 90# 2 x 10  TREATMENT DATE: 01/17/24 Initial eval completed and initial HEP established Educated on fall prevention and fall risk   PATIENT EDUCATION:  Education details: Discussed appropriate level of exercise and reason for falls, fall prevention Person educated: Patient Education method: Programmer, Multimedia, Demonstration, Verbal cues, and Handouts Education comprehension: verbalized understanding, returned demonstration, and verbal cues required  HOME EXERCISE PROGRAM: Access Code: [email or web address] URL: [email or web address] Date: 01/30/2024 Prepared by: Delon Haddock  Exercises - Standing Hamstring Stretch on Chair  - 1 x daily - 7 x weekly - 1 sets - 3 reps - 30 sec hold - Quadricep Stretch with Chair and Counter Support  - 1 x daily - 7 x weekly - 1 sets - 3 reps - 30 sec hold - Seated Figure 4 Piriformis Stretch  - 1 x daily - 7 x weekly - 1 sets - 3 reps - 30 sed hold - Sidelying Open Book Thoracic Lumbar Rotation and Extension  - 1 x daily - 7 x weekly - 1 sets - 10 reps - 5 sec hold - Single Leg Cone Touch  - 1 x daily - 7 x weekly - 3 sets - 10 reps - Side Stepping with Resistance at Ankles  - 1 x daily - 7 x weekly - 3 sets - 10 reps  ASSESSMENT:  CLINICAL IMPRESSION: [name] continues to enjoy a challenge.  We worked on agility type activities today.  He has some difficulty with adducting the right LE with activities such as step through hurdles.  He tends to  circumduct on the right while doing proper step over with the left.    He is well motivated and compliant.   He would benefit from continuing skilled PT for quad rehab, balance training, gait training and fall prevention.    OBJECTIVE IMPAIRMENTS: Abnormal gait, decreased balance, difficulty walking, decreased strength, hypomobility, increased fascial restrictions, increased muscle spasms, impaired flexibility, improper body mechanics, postural dysfunction, and pain.   ACTIVITY LIMITATIONS: carrying, lifting, standing, squatting, sleeping, stairs, transfers, bathing, toileting, dressing, hygiene/grooming, and caring for others  PARTICIPATION LIMITATIONS: meal prep, cleaning, laundry, interpersonal relationship, driving, shopping, and yard work  PERSONAL FACTORS: Age, Fitness, Past/current experiences, and 1-2 comorbidities: pacemaker Htn, CHF are also affecting patient's functional outcome.   REHAB POTENTIAL: Good  CLINICAL DECISION MAKING: Stable/uncomplicated  EVALUATION COMPLEXITY: Low   GOALS: Goals reviewed with patient? Yes  SHORT TERM GOALS: Target date: 02/14/2024  Pain report to be no greater than 4/10  Baseline: Goal status: Met 01/19/24  2.  Patient will be independent with initial HEP  Baseline:  Goal status: MET 01/30/24   LONG TERM GOALS: Target date: 03/13/2024  Patient to report pain no greater than 2/10  Baseline:  Goal status:  MET 01/30/24  2.  Patient to be independent with advanced HEP  Baseline:  Goal status: In progress  3.  BERG to improve by 2-3 points Baseline:  Goal status: In progress  4.  Functional tests to improve by 2-3 sec Baseline:  Goal status: In progress  5.  No falls during PT episode Baseline:  Goal status: In progress  6.  Patient to be able to ascend and descend steps without pain or no greater than 2/10  Baseline:  Goal status: In progress   PLAN:  PT FREQUENCY: 2x/week  PT DURATION: 8 weeks  PLANNED INTERVENTIONS:  [id number]-Therapeutic exercises, 97530- Therapeutic activity, [id number]- Neuromuscular re-education, 97535- Self Care, [id number]- Manual therapy, [phone]- Gait training, [phone]- Canalith repositioning, [id number]- Aquatic Therapy, [phone]- Electrical stimulation (unattended), [phone]- Electrical stimulation (manual), [id number]- Vasopneumatic device, [id number]- Ultrasound, [id number]- Traction (mechanical), [id number]- Ionotophoresis 4mg /ml Dexamethasone , [id number] (1-2 muscles), [id number] (3+ muscles)- Dry Needling, Patient/Family education, Balance training, Stair training, Taping, Joint mobilization, Spinal mobilization, Vestibular training, DME instructions, Cryotherapy, and Moist heat  PLAN FOR NEXT SESSION: Nustep, progress carioca and hurdles, progress quad rehab and balance training   [place] Hijazi B. Trever Streater, PT 02/09/2024 10:21 AM [facility] [address] [place], [place] [address] Phone # [phone] Fax [phone]  "

## 2024-02-11 ENCOUNTER — Other Ambulatory Visit: Payer: Self-pay | Admitting: Family

## 2024-02-11 DIAGNOSIS — I5022 Chronic systolic (congestive) heart failure: Secondary | ICD-10-CM

## 2024-02-14 ENCOUNTER — Ambulatory Visit

## 2024-02-14 DIAGNOSIS — R262 Difficulty in walking, not elsewhere classified: Secondary | ICD-10-CM

## 2024-02-14 DIAGNOSIS — R2681 Unsteadiness on feet: Secondary | ICD-10-CM

## 2024-02-14 DIAGNOSIS — R293 Abnormal posture: Secondary | ICD-10-CM

## 2024-02-14 DIAGNOSIS — R252 Cramp and spasm: Secondary | ICD-10-CM

## 2024-02-14 DIAGNOSIS — M6281 Muscle weakness (generalized): Secondary | ICD-10-CM

## 2024-02-14 NOTE — Therapy (Signed)
 " OUTPATIENT PHYSICAL THERAPY LOWER EXTREMITY TREATMENT   Patient Name: [name] MRN: [id number] DOB:06-23-1940, 84 y.o., male Today's Date: 02/14/2024  END OF SESSION:  PT End of Session - 02/14/24 1111     Visit Number 6    Number of Visits 16    Date for Recertification  03/13/24    Authorization Type [employer]    Authorization Time Period 01/17/24 through 04/16/24    Authorization - Visit Number 4    Authorization - Number of Visits 16    Progress Note Due on Visit 10    PT Start Time 1100    PT Stop Time 1145    PT Time Calculation (min) 45 min    Activity Tolerance Patient tolerated treatment well    Behavior During Therapy WFL for tasks assessed/performed          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   CHF (congestive heart failure) (HCC)    Elevated cholesterol    Gout    Hepatitis 1964   hepatitis A    HFrEF (heart failure with reduced ejection fraction) (HCC)    Hypertension    Moderate mitral regurgitation    Pre-diabetes    Severe aortic stenosis    Past Surgical History:  Procedure Laterality Date   AORTIC VALVE REPLACEMENT N/A 07/24/2017   Procedure: AORTIC VALVE REPLACEMENT (AVR) using 23mm Inspiris Aortic Valve;  Surgeon: Lucas Dorise POUR, MD;  Location: [facility] OR;  Service: Open Heart Surgery;  Laterality: N/A;   CLIPPING OF ATRIAL APPENDAGE N/A 07/24/2017   Procedure: CLIPPING OF ATRIAL APPENDAGE using a 50mm Atricure clip;  Surgeon: Lucas Dorise POUR, MD;  Location: [facility] OR;  Service: Open Heart Surgery;  Laterality: N/A;   CORONARY ARTERY BYPASS GRAFT N/A 07/24/2017   Procedure: CORONARY ARTERY BYPASS GRAFTING (CABG) times 3 using the left greater saphenous vein harvested endoscopically and left internal mammary artery.;  Surgeon: Lucas Dorise POUR, MD;  Location: [facility] OR;  Service: Open Heart Surgery;  Laterality: N/A;   EYE SURGERY Bilateral    lens replacements for cataracts   HERNIA  REPAIR     INGUINAL HERNIA REPAIR Right 05/06/2015   Procedure: LAPAROSCOPIC RIGHT INGUINAL HERNIA WITH MESH;  Surgeon: Vicenta Poli, MD;  Location: [facility] ORS;  Service: General;  Laterality: Right;   INSERTION OF MESH Right 05/06/2015   Procedure: INSERTION OF MESH;  Surgeon: Vicenta Poli, MD;  Location: [facility] ORS;  Service: General;  Laterality: Right;   IR BONE MARROW BIOPSY & ASPIRATION  09/15/2022   RIGHT HEART CATH N/A 03/21/2023   Procedure: RIGHT HEART CATH;  Surgeon: Elmira Newman PARAS, MD;  Location: [facility] INVASIVE CV LAB;  Service: Cardiovascular;  Laterality: N/A;   RIGHT/LEFT HEART CATH AND CORONARY ANGIOGRAPHY N/A 07/10/2017   Procedure: RIGHT/LEFT HEART CATH AND CORONARY ANGIOGRAPHY;  Surgeon: Elmira Newman PARAS, MD;  Location: [facility] INVASIVE CV LAB;  Service: Cardiovascular;  Laterality: N/A;   TEE WITHOUT CARDIOVERSION N/A 07/11/2017   Procedure: TRANSESOPHAGEAL ECHOCARDIOGRAM (TEE);  Surgeon: Elmira Newman PARAS, MD;  Location: [facility] ENDOSCOPY;  Service: Cardiovascular;  Laterality: N/A;   TEE WITHOUT CARDIOVERSION N/A 07/24/2017   Procedure: TRANSESOPHAGEAL ECHOCARDIOGRAM (TEE);  Surgeon: Lucas Dorise POUR, MD;  Location: [facility] OR;  Service: Open Heart Surgery;  Laterality: N/A;   TEE WITHOUT CARDIOVERSION N/A 08/01/2022   Procedure: TRANSESOPHAGEAL ECHOCARDIOGRAM;  Surgeon: Pietro Redell RAMAN, MD;  Location: [facility] INVASIVE  CV LAB;  Service: Cardiovascular;  Laterality: N/A;   TOTAL KNEE ARTHROPLASTY Right 08/07/2019   Procedure: TOTAL KNEE ARTHROPLASTY;  Surgeon: Melodi Lerner, MD;  Location: [facility] ORS;  Service: Orthopedics;  Laterality: Right;   ULTRASOUND GUIDANCE FOR VASCULAR ACCESS  07/10/2017   Procedure: Ultrasound Guidance For Vascular Access;  Surgeon: Elmira Newman PARAS, MD;  Location: [facility] INVASIVE CV LAB;  Service: Cardiovascular;;   Patient Active Problem List   Diagnosis Date Noted   Presence of heart assist device (HCC) 03/29/2023   Leukopenia 09/13/2022   PICC (peripherally inserted  central catheter) in place 08/30/2022   Pure hypercholesterolemia 08/03/2022   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   Encounter for assessment of peripherally inserted central catheter (PICC) 08/02/2022   Aortic valve endocarditis 08/01/2022   Cardiomyopathy (HCC) 08/01/2022   Chronic HFrEF (heart failure with reduced ejection fraction) (HCC) 08/01/2022   Prosthetic cardiac valve vegetation 08/01/2022   Anemia 08/01/2022   Streptococcal bacteremia 07/29/2022   Aortic atherosclerosis 07/29/2022   Medication management 07/29/2022   Bacteremia 07/29/2022   Low back pain 06/24/2022   Constipation 06/24/2022   Renal cyst, 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   PAC (premature atrial contraction) 03/01/2021   PVC (premature ventricular contraction) 03/01/2021   Atrial bigeminy 02/28/2020   OA (osteoarthritis) of knee 08/07/2019   Primary osteoarthritis of right knee 08/07/2019   Pain in right knee 05/17/2019   Coronary artery disease involving coronary bypass graft of native heart without angina pectoris 09/19/2018   Weakness 08/01/2018   Mixed hyperlipidemia 06/06/2018   S/P left atrial appendage ligation 07/25/2017   S/P AVR 07/25/2017   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   Coronary artery disease without angina pectoris 07/12/2017   HFrEF (heart failure with reduced ejection fraction) (HCC) 07/07/2017   Mitral regurgitation 07/07/2017   (HFpEF) heart failure with preserved ejection fraction (HCC) 07/07/2017   Gout 02/12/2013   Benign hypertension 02/12/2013   GERD (gastroesophageal reflux disease) 02/12/2013    PCP: Leonarda Roxan BROCKS, NP  REFERRING PROVIDER: Gil Greig BRAVO, NP  REFERRING DIAG: R26.81 (ICD-10-CM) - Unstable gait R26.89 (ICD-10-CM) - Balance problem  THERAPY DIAG:  Difficulty in walking, not elsewhere classified  Unsteadiness on feet  Muscle weakness (generalized)  Cramp and spasm  Abnormal posture  Rationale for Evaluation and Treatment: Rehabilitation  ONSET DATE:  01/11/2024  SUBJECTIVE:   SUBJECTIVE STATEMENT: Patient reports he was able to play disc golf yesterday.  I am still standing still for my throws.  I don't  feel sturdy enough to do a step through delivery.    PERTINENT HISTORY: na PAIN:  02/14/24 Are you having pain? Yes: NPRS scale: 0/10 Pain location: left knee Pain description: aching Aggravating factors: downhill or down steps Relieving factors: rest   PRECAUTIONS: Fall  RED FLAGS: None   WEIGHT BEARING RESTRICTIONS: No  FALLS:  Has patient fallen in last 6 months? Yes. 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  DIAGNOSTIC FINDINGS: na  PATIENT SURVEYS:  LEFS  Extreme difficulty/unable (0), Quite a bit of difficulty (1), Moderate difficulty (2), Little difficulty (3), No difficulty (4) Survey date:    Any of your usual work, housework or school activities   2. Usual hobbies, recreational or sporting activities   3. Getting into/out of the bath   4. Walking between rooms   5. Putting on socks/shoes   6. Squatting    7. Lifting an object, like a bag of groceries from the floor   8. Performing light activities around your home   9. Performing heavy activities around your home   10. Getting into/out of a car   11. Walking 2 blocks   12. Walking 1 mile   13. Going  up/down 10 stairs (1 flight)   14. Standing for 1 hour   15.  sitting for 1 hour   16. Running on even ground   17. Running on uneven ground   18. Making sharp turns while running fast   19. Hopping    20. Rolling over in bed   Score total:  62/80     COGNITION: Overall cognitive status: Within functional limits for tasks assessed     SENSATION: WFL   MUSCLE LENGTH: Hamstrings: Right 45 deg; Left 40 deg Thomas test: Right pos; Left pos  POSTURE: rounded shoulders and forward head  PALPATION: Mild crepitus   LOWER EXTREMITY ROM:  Right knee flexion : approx 110 degrees,  extension approx -20 Left knee flexion: approx 100 degrees, extension approx -20  LOWER EXTREMITY MMT:   4/5 extension bilaterally   4/5 flexion bilaterally  LOWER EXTREMITY SPECIAL TESTS:  Knee special tests: Patellafemoral grind test: negative  FUNCTIONAL TESTS:  5 times sit to stand: 12.03 sec Timed up and go (TUG): 13.15  GAIT: Distance walked: 30 feet Assistive device utilized: None Level of assistance: Complete Independence Comments: slight deviation on turns but otherwise good heel to toe gait   TREATMENT DATE: 02/14/24 Nustep level 7 x 8 min (PT present to discuss status and progress) Rocker board x 2 min for ankle mobility Gastroc stretch x 10 holding 10 sec each [place] x 3 laps of 15 feet Cross stance x 5 trying to hold 10 sec each Seated piriformis stretch x 5 hold 10 sec each Cross leg stretch x 5 holding 10 sec each Wide sweep with slider in standing for balance and hip mobility x 10 each LE with min assist then at barre x 10 each LE Backward lunge using slider at barre with single UE support x 10 ea Gait training in  front of mirror x 8 min                                                                                                                              TREATMENT DATE: 02/09/24 Nustep level 5 x 5 min (PT present to discuss status and progress) [place]  x 3 laps of 15  feet Heel to toe on line x 15 feet x 3 laps Fast feet fwd x 20 Fast feet in tandem side to side x 10 each with each leg in front  Hurdle step through fwd gait x 3 laps Hurdle side step to gait x 3 laps Step up and hold x 10 each LE fwd and x 10 each LE lateral using balance pad Hip matrix with 40 lbs hip abduction and extension 2 x 10 each Squat to 16 side of rit fit box 2 x 10  TREATMENT DATE: 01/30/24 Nustep level 5 x 5 min (PT present to discuss status and progress) Standing hamstring stretch 2 x 30 sec on each LE Standing quad stretch 2 x 30 sec on each LE Lateral band walks with red loop x 3 laps (less vc's needed for proper step width but  Standing in tandem on balance pads 2 x 1 min sec each LE in front with min assist to [employer] board x 2 min Step up and hold x 10 each LE fwd then x 10 each LE lateral with min to cga Cone touches 3 x 10 each LE  TREATMENT DATE: 01/24/24 Nustep level 5 x 5 min (PT present to discuss status and progress) Standing hamstring stretch 2 x 30 sec on each LE Standing quad stretch 2 x 30 sec on each LE Lateral band walks with red loop x 3 laps (unable to get much seperation  Standing in tandem on balance pads 2 x 30 sec each LE in front with min assist to [employer] board x 2 min Step up and hold x 10 each LE fwd then x 10 each LE lateral with min to cga Leg Press 90# 2 x 10  TREATMENT DATE: 01/17/24 Initial eval completed and initial HEP established Educated on fall prevention and fall risk   PATIENT EDUCATION:  Education details: Discussed appropriate level of exercise and reason for falls, fall prevention Person educated: Patient Education method: Programmer, Multimedia, Demonstration, Verbal cues, and Handouts Education comprehension: verbalized understanding, returned demonstration, and verbal cues required  HOME EXERCISE PROGRAM: Access Code: [email or web address] URL: [email or web address] Date: 02/14/2024 Prepared by: Delon Haddock  Exercises - Standing Hamstring Stretch on Chair  - 1 x daily - 7 x weekly - 1 sets - 3 reps - 30 sec hold - Quadricep Stretch with Chair and Counter Support  - 1 x daily - 7 x weekly - 1 sets - 3 reps - 30 sec hold - Seated Figure 4 Piriformis Stretch  - 1 x daily - 7 x weekly - 1 sets - 3 reps - 30 sed hold - Sidelying Open Book Thoracic Lumbar Rotation and Extension  - 1 x daily - 7 x weekly - 1 sets - 10 reps - 5 sec hold - Single Leg Cone Touch  - 1 x daily - 7 x weekly - 3 sets - 10 reps - Side Stepping with Resistance at Ankles  - 1 x daily - 7 x weekly - 3 sets - 10 reps - Reverse Lunge With Slider at Counter  - 1 x daily - 7 x weekly - 3 sets - 10 reps - Lateral Lunge With Slider  - 1 x daily - 7 x weekly - 3 sets - 10 reps  ASSESSMENT:  CLINICAL IMPRESSION: [place] continues to improve.  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  We are also working on narrower BOS and being able  to do step cross over.  He is well motivated and compliant. He should continue to do well.  He would benefit from continuing skilled PT for quad rehab, balance training, gait training and fall prevention.    OBJECTIVE IMPAIRMENTS: Abnormal gait, decreased balance, difficulty walking, decreased strength, hypomobility, increased fascial restrictions, increased muscle spasms, impaired flexibility, improper body mechanics, postural dysfunction, and pain.   ACTIVITY LIMITATIONS: carrying, lifting, standing, squatting, sleeping, stairs, transfers, bathing, toileting, dressing, hygiene/grooming, and caring for others  PARTICIPATION LIMITATIONS: meal prep, cleaning, laundry, interpersonal relationship, driving, shopping, and yard work  PERSONAL FACTORS: Age, Fitness, Past/current experiences, and 1-2 comorbidities: pacemaker Htn, CHF are also affecting patient's functional outcome.   REHAB POTENTIAL: Good  CLINICAL DECISION MAKING: Stable/uncomplicated  EVALUATION COMPLEXITY:  Low   GOALS: Goals reviewed with patient? Yes  SHORT TERM GOALS: Target date: 02/14/2024  Pain report to be no greater than 4/10  Baseline: Goal status: Met 01/19/24  2.  Patient will be independent with initial HEP  Baseline:  Goal status: MET 01/30/24   LONG TERM GOALS: Target date: 03/13/2024  Patient to report pain no greater than 2/10  Baseline:  Goal status: MET 01/30/24  2.  Patient to be independent with advanced HEP  Baseline:  Goal status: In progress  3.  BERG to improve by 2-3 points Baseline:  Goal status: In progress  4.  Functional tests to improve by 2-3 sec Baseline:  Goal status: In progress  5.  No falls during PT episode Baseline:  Goal status: In progress  6.  Patient to be able to ascend and descend steps without pain or no greater than 2/10  Baseline:  Goal status: In progress   PLAN:  PT FREQUENCY: 2x/week  PT DURATION: 8 weeks  PLANNED INTERVENTIONS: 97110-Therapeutic exercises, 97530- Therapeutic activity, [id number]- Neuromuscular re-education, [phone]- Self Care, 02859- Manual therapy, [phone]- Gait training, [phone]- Canalith repositioning, [id number]- Aquatic Therapy, [phone]- Electrical stimulation (unattended), [phone]- Electrical stimulation (manual), [id number]- Vasopneumatic device, [id number]- Ultrasound, [id number]- Traction (mechanical), [id number]- Ionotophoresis 4mg /ml Dexamethasone , [id number] (1-2 muscles), [id number] (3+ muscles)- Dry Needling, Patient/Family education, Balance training, Stair training, Taping, Joint mobilization, Spinal mobilization, Vestibular training, DME instructions, Cryotherapy, and Moist heat  PLAN FOR NEXT SESSION: Nustep, Slider work for balance, progress charity fundraiser and hurdles, progress quad rehab and Field Seismologist B. Joaopedro Eschbach, PT 02/14/2024 12:21 PM [facility] [address] [place], [place] [address] Phone # [phone] Fax [phone]  "

## 2024-02-15 ENCOUNTER — Encounter: Payer: Self-pay | Admitting: Cardiology

## 2024-02-16 ENCOUNTER — Ambulatory Visit

## 2024-02-16 DIAGNOSIS — M6281 Muscle weakness (generalized): Secondary | ICD-10-CM

## 2024-02-16 DIAGNOSIS — R252 Cramp and spasm: Secondary | ICD-10-CM

## 2024-02-16 DIAGNOSIS — R262 Difficulty in walking, not elsewhere classified: Secondary | ICD-10-CM

## 2024-02-16 DIAGNOSIS — R293 Abnormal posture: Secondary | ICD-10-CM

## 2024-02-16 DIAGNOSIS — R2681 Unsteadiness on feet: Secondary | ICD-10-CM

## 2024-02-16 NOTE — Therapy (Signed)
 " OUTPATIENT PHYSICAL THERAPY LOWER EXTREMITY TREATMENT   Patient Name: [name] MRN: [id number] DOB:1940-12-21, 84 y.o., male Today's Date: 02/16/2024  END OF SESSION:  PT End of Session - 02/16/24 1114     Visit Number 7    Number of Visits 16    Date for Recertification  03/13/24    Authorization Type [employer]    Authorization Time Period 01/17/24 through 04/16/24    Authorization - Visit Number 7    Authorization - Number of Visits 16    Progress Note Due on Visit 10    PT Start Time 1015    PT Stop Time 1100    PT Time Calculation (min) 45 min    Activity Tolerance Patient tolerated treatment well    Behavior During Therapy WFL for tasks assessed/performed           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   CHF (congestive heart failure) (HCC)    Elevated cholesterol    Gout    Hepatitis 1964   hepatitis A    HFrEF (heart failure with reduced ejection fraction) (HCC)    Hypertension    Moderate mitral regurgitation    Pre-diabetes    Severe aortic stenosis    Past Surgical History:  Procedure Laterality Date   AORTIC VALVE REPLACEMENT N/A 07/24/2017   Procedure: AORTIC VALVE REPLACEMENT (AVR) using 23mm Inspiris Aortic Valve;  Surgeon: Lucas Dorise POUR, MD;  Location: [facility] OR;  Service: Open Heart Surgery;  Laterality: N/A;   CLIPPING OF ATRIAL APPENDAGE N/A 07/24/2017   Procedure: CLIPPING OF ATRIAL APPENDAGE using a 50mm Atricure clip;  Surgeon: Lucas Dorise POUR, MD;  Location: [facility] OR;  Service: Open Heart Surgery;  Laterality: N/A;   CORONARY ARTERY BYPASS GRAFT N/A 07/24/2017   Procedure: CORONARY ARTERY BYPASS GRAFTING (CABG) times 3 using the left greater saphenous vein harvested endoscopically and left internal mammary artery.;  Surgeon: Lucas Dorise POUR, MD;  Location: [facility] OR;  Service: Open Heart Surgery;  Laterality: N/A;   EYE SURGERY Bilateral    lens replacements for cataracts   HERNIA  REPAIR     INGUINAL HERNIA REPAIR Right 05/06/2015   Procedure: LAPAROSCOPIC RIGHT INGUINAL HERNIA WITH MESH;  Surgeon: Vicenta Poli, MD;  Location: [facility] ORS;  Service: General;  Laterality: Right;   INSERTION OF MESH Right 05/06/2015   Procedure: INSERTION OF MESH;  Surgeon: Vicenta Poli, MD;  Location: [facility] ORS;  Service: General;  Laterality: Right;   IR BONE MARROW BIOPSY & ASPIRATION  09/15/2022   RIGHT HEART CATH N/A 03/21/2023   Procedure: RIGHT HEART CATH;  Surgeon: Elmira Newman PARAS, MD;  Location: [facility] INVASIVE CV LAB;  Service: Cardiovascular;  Laterality: N/A;   RIGHT/LEFT HEART CATH AND CORONARY ANGIOGRAPHY N/A 07/10/2017   Procedure: RIGHT/LEFT HEART CATH AND CORONARY ANGIOGRAPHY;  Surgeon: Elmira Newman PARAS, MD;  Location: [facility] INVASIVE CV LAB;  Service: Cardiovascular;  Laterality: N/A;   TEE WITHOUT CARDIOVERSION N/A 07/11/2017   Procedure: TRANSESOPHAGEAL ECHOCARDIOGRAM (TEE);  Surgeon: Elmira Newman PARAS, MD;  Location: [facility] ENDOSCOPY;  Service: Cardiovascular;  Laterality: N/A;   TEE WITHOUT CARDIOVERSION N/A 07/24/2017   Procedure: TRANSESOPHAGEAL ECHOCARDIOGRAM (TEE);  Surgeon: Lucas Dorise POUR, MD;  Location: [facility] OR;  Service: Open Heart Surgery;  Laterality: N/A;   TEE WITHOUT CARDIOVERSION N/A 08/01/2022   Procedure: TRANSESOPHAGEAL ECHOCARDIOGRAM;  Surgeon: Pietro Redell RAMAN, MD;  Location: [facility]  INVASIVE CV LAB;  Service: Cardiovascular;  Laterality: N/A;   TOTAL KNEE ARTHROPLASTY Right 08/07/2019   Procedure: TOTAL KNEE ARTHROPLASTY;  Surgeon: Melodi Lerner, MD;  Location: [facility] ORS;  Service: Orthopedics;  Laterality: Right;   ULTRASOUND GUIDANCE FOR VASCULAR ACCESS  07/10/2017   Procedure: Ultrasound Guidance For Vascular Access;  Surgeon: Elmira Newman PARAS, MD;  Location: [facility] INVASIVE CV LAB;  Service: Cardiovascular;;   Patient Active Problem List   Diagnosis Date Noted   Presence of heart assist device (HCC) 03/29/2023   Leukopenia 09/13/2022   PICC (peripherally inserted  central catheter) in place 08/30/2022   Pure hypercholesterolemia 08/03/2022   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   Encounter for assessment of peripherally inserted central catheter (PICC) 08/02/2022   Aortic valve endocarditis 08/01/2022   Cardiomyopathy (HCC) 08/01/2022   Chronic HFrEF (heart failure with reduced ejection fraction) (HCC) 08/01/2022   Prosthetic cardiac valve vegetation 08/01/2022   Anemia 08/01/2022   Streptococcal bacteremia 07/29/2022   Aortic atherosclerosis 07/29/2022   Medication management 07/29/2022   Bacteremia 07/29/2022   Low back pain 06/24/2022   Constipation 06/24/2022   Renal cyst, 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   PAC (premature atrial contraction) 03/01/2021   PVC (premature ventricular contraction) 03/01/2021   Atrial bigeminy 02/28/2020   OA (osteoarthritis) of knee 08/07/2019   Primary osteoarthritis of right knee 08/07/2019   Pain in right knee 05/17/2019   Coronary artery disease involving coronary bypass graft of native heart without angina pectoris 09/19/2018   Weakness 08/01/2018   Mixed hyperlipidemia 06/06/2018   S/P left atrial appendage ligation 07/25/2017   S/P AVR 07/25/2017   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   Coronary artery disease without angina pectoris 07/12/2017   HFrEF (heart failure with reduced ejection fraction) (HCC) 07/07/2017   Mitral regurgitation 07/07/2017   (HFpEF) heart failure with preserved ejection fraction (HCC) 07/07/2017   Gout 02/12/2013   Benign hypertension 02/12/2013   GERD (gastroesophageal reflux disease) 02/12/2013    PCP: Leonarda Roxan BROCKS, NP  REFERRING PROVIDER: Gil Greig BRAVO, NP  REFERRING DIAG: R26.81 (ICD-10-CM) - Unstable gait R26.89 (ICD-10-CM) - Balance problem  THERAPY DIAG:  Difficulty in walking, not elsewhere classified  Unsteadiness on feet  Muscle weakness (generalized)  Cramp and spasm  Abnormal posture  Rationale for Evaluation and Treatment: Rehabilitation  ONSET DATE:  01/11/2024  SUBJECTIVE:   SUBJECTIVE STATEMENT: Patient reports he was able to play disc golf yesterday and do a few holes of the step through approach.  He did have a fall at one hole but this was while doing a different type putt where he lunges backward onto one leg.  His back leg (left) buckled and he fell backwards.  He denies any injury or soreness.     PERTINENT HISTORY: na PAIN:  02/16/24 Are you having pain? Yes: NPRS scale: 0/10 Pain location: left knee Pain description: aching Aggravating factors: downhill or down steps Relieving factors: rest   PRECAUTIONS: Fall  RED FLAGS: None   WEIGHT BEARING RESTRICTIONS: No  FALLS:  Has patient fallen in last 6 months? Yes. 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  DIAGNOSTIC FINDINGS: na  PATIENT SURVEYS:  LEFS  Extreme difficulty/unable (0), Quite a bit of difficulty (1), Moderate difficulty (2), Little difficulty (3), No difficulty (4) Survey date:    Any of your usual work, housework or school activities   2. Usual hobbies, recreational or sporting activities   3. Getting into/out of the bath   4. Walking between rooms   5. Putting on socks/shoes   6. Squatting    7. Lifting an object, like a bag of groceries from the floor   8. Performing light activities around  your home   9. Performing heavy activities around your home   10. Getting into/out of a car   11. Walking 2 blocks   12. Walking 1 mile   13. Going up/down 10 stairs (1 flight)   14. Standing for 1 hour   15.  sitting for 1 hour   16. Running on even ground   17. Running on uneven ground   18. Making sharp turns while running fast   19. Hopping    20. Rolling over in bed   Score total:  62/80     COGNITION: Overall cognitive status: Within functional limits for tasks assessed     SENSATION: WFL   MUSCLE LENGTH: Hamstrings: Right 45 deg; Left 40 deg Thomas test: Right pos; Left pos  POSTURE: rounded shoulders and forward head  PALPATION: Mild crepitus   LOWER EXTREMITY ROM:  Right knee flexion : approx 110 degrees,  extension approx -20 Left knee flexion: approx 100 degrees, extension approx -20  LOWER EXTREMITY MMT:   4/5 extension bilaterally   4/5 flexion bilaterally  LOWER EXTREMITY SPECIAL TESTS:  Knee special tests: Patellafemoral grind test: negative  FUNCTIONAL TESTS:  5 times sit to stand: 12.03 sec Timed up and go (TUG): 13.15  GAIT: Distance walked: 30 feet Assistive device utilized: None Level of assistance: Complete Independence Comments: slight deviation on turns but otherwise good heel to toe gait   TREATMENT DATE: 02/16/24 Recumbent bike level 1 x 10 min (PT present to discuss status and progress) Carioca x 3 laps of 10 steps each way Hip matrix 40 lbs 2 x 10 hip abduction then hip extension  Leg Press 2 x 10 90 lbs Sit to stand with 15 lb kb x 10 Squat to table with 15 lb kb x 10 Isometric hip adduction with purple ball x 20 Isometric hip adduction with LAQ 2 x 10 with 6 lb ankle weight Lunge to BOSU x 10 fwd each LE and x 10 lateral each   TREATMENT DATE: 02/14/24 Nustep level 7 x 8 min (PT present to discuss status and progress) Rocker board x 2 min for ankle mobility Gastroc stretch x 10 holding 10 sec each Carioca x 3 laps of 15  feet Cross stance x 5 trying to hold 10 sec each Seated piriformis stretch x 5 hold 10 sec each Cross leg stretch x 5 holding 10 sec each Wide sweep with slider in standing for balance and hip mobility x 10 each LE with min assist then at barre x 10 each LE Backward lunge using slider at barre with single UE support x 10 ea Gait training in  front of mirror x 8 min  TREATMENT DATE: 02/09/24 Nustep level 5 x 5 min (PT present to discuss status and progress) Carioca x 3 laps of 15 feet Heel to toe on line x 15 feet x 3 laps Fast feet fwd x 20 Fast feet in tandem side to side x 10 each with each leg in front  Hurdle step through fwd gait x 3 laps Hurdle side step to gait x 3 laps Step up and hold x 10 each LE fwd and x 10 each LE lateral using balance pad Hip matrix with 40 lbs hip abduction and extension 2 x 10 each Squat to 16 side of rit fit box 2 x 10  TREATMENT DATE: 01/17/24 Initial eval completed and initial HEP established Educated on fall prevention and fall risk   PATIENT EDUCATION:  Education details: Discussed appropriate level of exercise and reason for falls, fall prevention Person educated: Patient Education method: Programmer, Multimedia, Demonstration, Verbal cues, and Handouts Education comprehension: verbalized understanding, returned demonstration, and verbal cues required  HOME EXERCISE PROGRAM: Access Code: [email or web address] URL: [email or web address] Date: 02/14/2024 Prepared by: Delon Haddock  Exercises - Standing Hamstring Stretch on Chair  - 1 x daily - 7 x weekly - 1 sets - 3 reps - 30 sec hold - Quadricep Stretch with Chair and Counter Support  - 1 x daily - 7 x weekly - 1 sets - 3 reps - 30 sec hold - Seated Figure 4 Piriformis Stretch  - 1 x daily - 7 x weekly - 1 sets - 3 reps - 30 sed hold - Sidelying Open Book Thoracic Lumbar  Rotation and Extension  - 1 x daily - 7 x weekly - 1 sets - 10 reps - 5 sec hold - Single Leg Cone Touch  - 1 x daily - 7 x weekly - 3 sets - 10 reps - Side Stepping with Resistance at Ankles  - 1 x daily - 7 x weekly - 3 sets - 10 reps - Reverse Lunge With Slider at Counter  - 1 x daily - 7 x weekly - 3 sets - 10 reps - Lateral Lunge With Slider  - 1 x daily - 7 x weekly - 3 sets - 10 reps  ASSESSMENT:  CLINICAL IMPRESSION: [place] did very well with carioca today.  He needed sba vs cga today with only 1 loss of balance.  He had been having trouble crossing over in front.  With verbal cues for better foot placement, he was able to do this with increased ease today.  He did report that he was able to do a few shots at disc golf where he did the step through approach.  He fell at one hole but this was not during the cross over stepping.  He had lunged back to do a different type putt and the left knee buckled.  He continues to be well motivated and compliant.  He should continue to do well.   He would benefit from continuing skilled PT for quad rehab, balance training, gait training and fall prevention.    OBJECTIVE IMPAIRMENTS: Abnormal gait, decreased balance, difficulty walking, decreased strength, hypomobility, increased fascial restrictions, increased muscle spasms, impaired flexibility, improper body mechanics, postural dysfunction, and pain.   ACTIVITY LIMITATIONS: carrying, lifting, standing, squatting, sleeping, stairs, transfers, bathing, toileting, dressing, hygiene/grooming, and caring for others  PARTICIPATION LIMITATIONS: meal prep, cleaning, laundry, interpersonal relationship, driving, shopping, and yard work  PERSONAL FACTORS: Age, Fitness, Past/current experiences, and 1-2 comorbidities: pacemaker Htn, CHF are also affecting patient's functional outcome.  REHAB POTENTIAL: Good  CLINICAL DECISION MAKING: Stable/uncomplicated  EVALUATION COMPLEXITY: Low   GOALS: Goals reviewed  with patient? Yes  SHORT TERM GOALS: Target date: 02/14/2024  Pain report to be no greater than 4/10  Baseline: Goal status: Met 01/19/24  2.  Patient will be independent with initial HEP  Baseline:  Goal status: MET 01/30/24   LONG TERM GOALS: Target date: 03/13/2024  Patient to report pain no greater than 2/10  Baseline:  Goal status: MET 01/30/24  2.  Patient to be independent with advanced HEP  Baseline:  Goal status: MET 02/16/24  3.  BERG to improve by 2-3 points Baseline:  Goal status: In progress  4.  Functional tests to improve by 2-3 sec Baseline:  Goal status: In progress  5.  No falls during PT episode Baseline: had one fall due to knee instability while doing a putting maneuver at disc golf on 02/15/23 Goal status: In progress  6.  Patient to be able to ascend and descend steps without pain or no greater than 2/10  Baseline:  Goal status: In progress   PLAN:  PT FREQUENCY: 2x/week  PT DURATION: 8 weeks  PLANNED INTERVENTIONS: 97110-Therapeutic exercises, 97530- Therapeutic activity, [id number]- Neuromuscular re-education, [phone]- Self Care, [id number]- Manual therapy, [phone]- Gait training, [phone]- Canalith repositioning, [id number]- Aquatic Therapy, [id number]- Electrical stimulation (unattended), [phone]- Electrical stimulation (manual), [id number]- Vasopneumatic device, [id number]- Ultrasound, [id number]- Traction (mechanical), [id number]- Ionotophoresis 4mg /ml Dexamethasone , [id number] (1-2 muscles), [id number] (3+ muscles)- Dry Needling, Patient/Family education, Balance training, Stair training, Taping, Joint mobilization, Spinal mobilization, Vestibular training, DME instructions, Cryotherapy, and Moist heat  PLAN FOR NEXT SESSION: Nustep, continue agility skills, slider work for balance, progress carioca and hurdles, progress quad rehab and balance training   Loren Vicens B. Sofi Bryars, PT 02/16/2024 11:15 AM [facility] [address] [place], [place] [address] Phone #  [phone] Fax [phone]  "

## 2024-02-21 ENCOUNTER — Ambulatory Visit

## 2024-02-21 DIAGNOSIS — R2681 Unsteadiness on feet: Secondary | ICD-10-CM

## 2024-02-21 DIAGNOSIS — R262 Difficulty in walking, not elsewhere classified: Secondary | ICD-10-CM

## 2024-02-21 DIAGNOSIS — R252 Cramp and spasm: Secondary | ICD-10-CM

## 2024-02-21 DIAGNOSIS — M6281 Muscle weakness (generalized): Secondary | ICD-10-CM

## 2024-02-21 DIAGNOSIS — R293 Abnormal posture: Secondary | ICD-10-CM

## 2024-02-21 NOTE — Therapy (Signed)
 " OUTPATIENT PHYSICAL THERAPY LOWER EXTREMITY TREATMENT   Patient Name: [name] MRN: [id number] DOB:06/27/1940, 84 y.o., male Today's Date: 02/21/2024  END OF SESSION:  PT End of Session - 02/21/24 1106     Visit Number 8    Number of Visits 16    Date for Recertification  03/13/24    Authorization Type [employer]    Authorization Time Period 01/17/24 through 04/16/24    Authorization - Visit Number 8    Authorization - Number of Visits 16    Progress Note Due on Visit 10    PT Start Time 1100    PT Stop Time 1145    PT Time Calculation (min) 45 min    Activity Tolerance Patient tolerated treatment well    Behavior During Therapy WFL for tasks assessed/performed           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   CHF (congestive heart failure) (HCC)    Elevated cholesterol    Gout    Hepatitis 1964   hepatitis A    HFrEF (heart failure with reduced ejection fraction) (HCC)    Hypertension    Moderate mitral regurgitation    Pre-diabetes    Severe aortic stenosis    Past Surgical History:  Procedure Laterality Date   AORTIC VALVE REPLACEMENT N/A 07/24/2017   Procedure: AORTIC VALVE REPLACEMENT (AVR) using 23mm Inspiris Aortic Valve;  Surgeon: Lucas Dorise POUR, MD;  Location: [facility] OR;  Service: Open Heart Surgery;  Laterality: N/A;   CLIPPING OF ATRIAL APPENDAGE N/A 07/24/2017   Procedure: CLIPPING OF ATRIAL APPENDAGE using a 50mm Atricure clip;  Surgeon: Lucas Dorise POUR, MD;  Location: [facility] OR;  Service: Open Heart Surgery;  Laterality: N/A;   CORONARY ARTERY BYPASS GRAFT N/A 07/24/2017   Procedure: CORONARY ARTERY BYPASS GRAFTING (CABG) times 3 using the left greater saphenous vein harvested endoscopically and left internal mammary artery.;  Surgeon: Lucas Dorise POUR, MD;  Location: [facility] OR;  Service: Open Heart Surgery;  Laterality: N/A;   EYE SURGERY Bilateral    lens replacements for cataracts   HERNIA  REPAIR     INGUINAL HERNIA REPAIR Right 05/06/2015   Procedure: LAPAROSCOPIC RIGHT INGUINAL HERNIA WITH MESH;  Surgeon: Vicenta Poli, MD;  Location: [facility] ORS;  Service: General;  Laterality: Right;   INSERTION OF MESH Right 05/06/2015   Procedure: INSERTION OF MESH;  Surgeon: Vicenta Poli, MD;  Location: [facility] ORS;  Service: General;  Laterality: Right;   IR BONE MARROW BIOPSY & ASPIRATION  09/15/2022   RIGHT HEART CATH N/A 03/21/2023   Procedure: RIGHT HEART CATH;  Surgeon: Elmira Newman PARAS, MD;  Location: [facility] INVASIVE CV LAB;  Service: Cardiovascular;  Laterality: N/A;   RIGHT/LEFT HEART CATH AND CORONARY ANGIOGRAPHY N/A 07/10/2017   Procedure: RIGHT/LEFT HEART CATH AND CORONARY ANGIOGRAPHY;  Surgeon: Elmira Newman PARAS, MD;  Location: [facility] INVASIVE CV LAB;  Service: Cardiovascular;  Laterality: N/A;   TEE WITHOUT CARDIOVERSION N/A 07/11/2017   Procedure: TRANSESOPHAGEAL ECHOCARDIOGRAM (TEE);  Surgeon: Elmira Newman PARAS, MD;  Location: [facility] ENDOSCOPY;  Service: Cardiovascular;  Laterality: N/A;   TEE WITHOUT CARDIOVERSION N/A 07/24/2017   Procedure: TRANSESOPHAGEAL ECHOCARDIOGRAM (TEE);  Surgeon: Lucas Dorise POUR, MD;  Location: [facility] OR;  Service: Open Heart Surgery;  Laterality: N/A;   TEE WITHOUT CARDIOVERSION N/A 08/01/2022   Procedure: TRANSESOPHAGEAL ECHOCARDIOGRAM;  Surgeon: Pietro Redell RAMAN, MD;  Location: [facility]  INVASIVE CV LAB;  Service: Cardiovascular;  Laterality: N/A;   TOTAL KNEE ARTHROPLASTY Right 08/07/2019   Procedure: TOTAL KNEE ARTHROPLASTY;  Surgeon: Melodi Lerner, MD;  Location: [facility] ORS;  Service: Orthopedics;  Laterality: Right;   ULTRASOUND GUIDANCE FOR VASCULAR ACCESS  07/10/2017   Procedure: Ultrasound Guidance For Vascular Access;  Surgeon: Elmira Newman PARAS, MD;  Location: [facility] INVASIVE CV LAB;  Service: Cardiovascular;;   Patient Active Problem List   Diagnosis Date Noted   Presence of heart assist device (HCC) 03/29/2023   Leukopenia 09/13/2022   PICC (peripherally inserted  central catheter) in place 08/30/2022   Pure hypercholesterolemia 08/03/2022   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   Encounter for assessment of peripherally inserted central catheter (PICC) 08/02/2022   Aortic valve endocarditis 08/01/2022   Cardiomyopathy (HCC) 08/01/2022   Chronic HFrEF (heart failure with reduced ejection fraction) (HCC) 08/01/2022   Prosthetic cardiac valve vegetation 08/01/2022   Anemia 08/01/2022   Streptococcal bacteremia 07/29/2022   Aortic atherosclerosis 07/29/2022   Medication management 07/29/2022   Bacteremia 07/29/2022   Low back pain 06/24/2022   Constipation 06/24/2022   Renal cyst, 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   PAC (premature atrial contraction) 03/01/2021   PVC (premature ventricular contraction) 03/01/2021   Atrial bigeminy 02/28/2020   OA (osteoarthritis) of knee 08/07/2019   Primary osteoarthritis of right knee 08/07/2019   Pain in right knee 05/17/2019   Coronary artery disease involving coronary bypass graft of native heart without angina pectoris 09/19/2018   Weakness 08/01/2018   Mixed hyperlipidemia 06/06/2018   S/P left atrial appendage ligation 07/25/2017   S/P AVR 07/25/2017   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   Coronary artery disease without angina pectoris 07/12/2017   HFrEF (heart failure with reduced ejection fraction) (HCC) 07/07/2017   Mitral regurgitation 07/07/2017   (HFpEF) heart failure with preserved ejection fraction (HCC) 07/07/2017   Gout 02/12/2013   Benign hypertension 02/12/2013   GERD (gastroesophageal reflux disease) 02/12/2013    PCP: Leonarda Roxan BROCKS, NP  REFERRING PROVIDER: Gil Greig BRAVO, NP  REFERRING DIAG: R26.81 (ICD-10-CM) - Unstable gait R26.89 (ICD-10-CM) - Balance problem  THERAPY DIAG:  Difficulty in walking, not elsewhere classified  Unsteadiness on feet  Muscle weakness (generalized)  Cramp and spasm  Abnormal posture  Rationale for Evaluation and Treatment: Rehabilitation  ONSET DATE:  01/11/2024  SUBJECTIVE:   SUBJECTIVE STATEMENT: Patient reports he is back to playing disc golf more regularly and feeling more confident.    PERTINENT HISTORY: na PAIN:  02/21/24 Are you having pain? Yes: NPRS scale: 0/10 Pain location: left knee Pain description: aching Aggravating factors: downhill or down steps Relieving factors: rest   PRECAUTIONS: Fall  RED FLAGS: None   WEIGHT BEARING RESTRICTIONS: No  FALLS:  Has patient fallen in last 6 months? Yes. 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  DIAGNOSTIC FINDINGS: na  PATIENT SURVEYS:  LEFS  Extreme difficulty/unable (0), Quite  a bit of difficulty (1), Moderate difficulty (2), Little difficulty (3), No difficulty (4) Survey date:    Any of your usual work, housework or school activities   2. Usual hobbies, recreational or sporting activities   3. Getting into/out of the bath   4. Walking between rooms   5. Putting on socks/shoes   6. Squatting    7. Lifting an object, like a bag of groceries from the floor   8. Performing light activities around your home   9. Performing heavy activities around your home   10. Getting into/out of a car   11. Walking 2 blocks   12. Walking 1 mile   13. Going up/down 10 stairs (1 flight)   14. Standing for 1 hour   15.   sitting for 1 hour   16. Running on even ground   17. Running on uneven ground   18. Making sharp turns while running fast   19. Hopping    20. Rolling over in bed   Score total:  62/80     COGNITION: Overall cognitive status: Within functional limits for tasks assessed     SENSATION: WFL   MUSCLE LENGTH: Hamstrings: Right 45 deg; Left 40 deg Thomas test: Right pos; Left pos  POSTURE: rounded shoulders and forward head  PALPATION: Mild crepitus   LOWER EXTREMITY ROM:  Right knee flexion : approx 110 degrees,  extension approx -20 Left knee flexion: approx 100 degrees, extension approx -20  LOWER EXTREMITY MMT:   4/5 extension bilaterally   4/5 flexion bilaterally  LOWER EXTREMITY SPECIAL TESTS:  Knee special tests: Patellafemoral grind test: negative  FUNCTIONAL TESTS:  5 times sit to stand: 12.03 sec Timed up and go (TUG): 13.15  GAIT: Distance walked: 30 feet Assistive device utilized: None Level of assistance: Complete Independence Comments: slight deviation on turns but otherwise good heel to toe gait  TREATMENT DATE: 02/21/24 Nustep x 5 min level 7 (PT present to discuss status and progress) 5 point star drill with slider x 5 each LE Wide sweep with slider in standing for balance and hip mobility x 5 each LE with min UE support at barre  Carioca x 5 laps of 5 steps each way Sit to stand with 15 lb kb x 10 Squat to table with 15 lb kb x 10 Step up and hold x 10 fwd then x 10 lateral  Obstacle course with unlevel surface using cobble boards and balance pads, turns and hurdles for increased step length for balance training.  5 laps with cg to min assist.   TREATMENT DATE: 02/16/24 Recumbent bike level 1 x 10 min (PT present to discuss status and progress) Carioca x 3 laps of 10 steps each way Hip matrix 40 lbs 2 x 10 hip abduction then hip extension  Leg Press 2 x 10 90 lbs Sit to stand with 15 lb kb x 10 Squat to table with 15 lb kb x 10 Isometric hip  adduction with purple ball x 20 Isometric hip adduction with LAQ 2 x 10 with 6 lb ankle weight Lunge to BOSU x 10 fwd each LE and x 10 lateral each   TREATMENT DATE: 02/14/24 Nustep level 7 x 8 min (PT present to discuss status and progress) Rocker board x 2 min for ankle mobility Gastroc stretch x 10 holding 10 sec each Carioca x 3 laps of 15 feet Cross stance x 5 trying to hold 10 sec each Seated piriformis stretch x 5 hold  10 sec each Cross leg stretch x 5 holding 10 sec each Wide sweep with slider in standing for balance and hip mobility x 10 each LE with min assist then at barre x 10 each LE Backward lunge using slider at barre with single UE support x 10 ea Gait training in  front of mirror x 8 min                                                                                                                              TREATMENT DATE: 01/17/24 Initial eval completed and initial HEP established Educated on fall prevention and fall risk   PATIENT EDUCATION:  Education details: Discussed appropriate level of exercise and reason for falls, fall prevention Person educated: Patient Education method: Programmer, Multimedia, Demonstration, Verbal cues, and Handouts Education comprehension: verbalized understanding, returned demonstration, and verbal cues required  HOME EXERCISE PROGRAM: Access Code: [email or web address] URL: [email or web address] Date: 02/14/2024 Prepared by: Delon Haddock  Exercises - Standing Hamstring Stretch on Chair  - 1 x daily - 7 x weekly - 1 sets - 3 reps - 30 sec hold - Quadricep Stretch with Chair and Counter Support  - 1 x daily - 7 x weekly - 1 sets - 3 reps - 30 sec hold - Seated Figure 4 Piriformis Stretch  - 1 x daily - 7 x weekly - 1 sets - 3 reps - 30 sed hold - Sidelying Open Book Thoracic Lumbar Rotation and Extension  - 1 x daily - 7 x weekly - 1 sets - 10 reps - 5 sec hold - Single Leg Cone Touch  - 1 x daily - 7 x weekly - 3 sets - 10 reps - Side  Stepping with Resistance at Ankles  - 1 x daily - 7 x weekly - 3 sets - 10 reps - Reverse Lunge With Slider at Counter  - 1 x daily - 7 x weekly - 3 sets - 10 reps - Lateral Lunge With Slider  - 1 x daily - 7 x weekly - 3 sets - 10 reps  ASSESSMENT:  CLINICAL IMPRESSION: [place] continues to show excellent response to higher level balance challenges.  He is very diligent with practicing his balance tasks at home. He needs mod vc's for postural elongation. But he also is mindful of this and often verbalizes correcting this without cue.  He continues to be well motivated and compliant.  He should continue to do well.   He would benefit from continuing skilled PT for quad rehab, balance training, gait training and fall prevention.    OBJECTIVE IMPAIRMENTS: Abnormal gait, decreased balance, difficulty walking, decreased strength, hypomobility, increased fascial restrictions, increased muscle spasms, impaired flexibility, improper body mechanics, postural dysfunction, and pain.   ACTIVITY LIMITATIONS: carrying, lifting, standing, squatting, sleeping, stairs, transfers, bathing, toileting, dressing, hygiene/grooming, and caring for others  PARTICIPATION LIMITATIONS: meal prep, cleaning, laundry, interpersonal relationship, driving, shopping, and yard work  PERSONAL FACTORS: Age, Fitness, Past/current  experiences, and 1-2 comorbidities: pacemaker Htn, CHF are also affecting patient's functional outcome.   REHAB POTENTIAL: Good  CLINICAL DECISION MAKING: Stable/uncomplicated  EVALUATION COMPLEXITY: Low   GOALS: Goals reviewed with patient? Yes  SHORT TERM GOALS: Target date: 02/14/2024  Pain report to be no greater than 4/10  Baseline: Goal status: Met 01/19/24  2.  Patient will be independent with initial HEP  Baseline:  Goal status: MET 01/30/24   LONG TERM GOALS: Target date: 03/13/2024  Patient to report pain no greater than 2/10  Baseline:  Goal status: MET 01/30/24  2.  Patient to  be independent with advanced HEP  Baseline:  Goal status: MET 02/16/24  3.  BERG to improve by 2-3 points Baseline:  Goal status: In progress  4.  Functional tests to improve by 2-3 sec Baseline:  Goal status: In progress  5.  No falls during PT episode Baseline: had one fall due to knee instability while doing a putting maneuver at disc golf on 02/15/23 Goal status: In progress  6.  Patient to be able to ascend and descend steps without pain or no greater than 2/10  Baseline:  Goal status: In progress   PLAN:  PT FREQUENCY: 2x/week  PT DURATION: 8 weeks  PLANNED INTERVENTIONS: 97110-Therapeutic exercises, 97530- Therapeutic activity, [id number]- Neuromuscular re-education, [phone]- Self Care, [id number]- Manual therapy, [phone]- Gait training, [phone]- Canalith repositioning, [id number]- Aquatic Therapy, [phone]- Electrical stimulation (unattended), [phone]- Electrical stimulation (manual), [id number]- Vasopneumatic device, [id number]- Ultrasound, [id number]- Traction (mechanical), [id number]- Ionotophoresis 4mg /ml Dexamethasone , [id number] (1-2 muscles), [id number] (3+ muscles)- Dry Needling, Patient/Family education, Balance training, Stair training, Taping, Joint mobilization, Spinal mobilization, Vestibular training, DME instructions, Cryotherapy, and Moist heat  PLAN FOR NEXT SESSION: Nustep, continue agility skills, slider work for balance, progress carioca and hurdles, progress quad rehab and balance training   Alexyss Balzarini B. Khalie Wince, PT 02/21/2024 12:44 PM [facility] [address] [place], [place] [address] Phone # [phone] Fax [phone]  "

## 2024-02-23 ENCOUNTER — Ambulatory Visit

## 2024-02-23 DIAGNOSIS — R252 Cramp and spasm: Secondary | ICD-10-CM

## 2024-02-23 DIAGNOSIS — M6281 Muscle weakness (generalized): Secondary | ICD-10-CM

## 2024-02-23 DIAGNOSIS — R293 Abnormal posture: Secondary | ICD-10-CM

## 2024-02-23 DIAGNOSIS — R262 Difficulty in walking, not elsewhere classified: Secondary | ICD-10-CM | POA: Diagnosis not present

## 2024-02-23 DIAGNOSIS — R2681 Unsteadiness on feet: Secondary | ICD-10-CM

## 2024-02-23 NOTE — Therapy (Signed)
 " OUTPATIENT PHYSICAL THERAPY LOWER EXTREMITY TREATMENT   Patient Name: [name] MRN: [id number] DOB:Nov 16, 1940, 84 y.o., male Today's Date: 02/23/2024  END OF SESSION:  PT End of Session - 02/23/24 1023     Visit Number 9    Number of Visits 16    Date for Recertification  03/13/24    Authorization Type [employer]    Authorization Time Period 01/17/24 through 04/16/24    Authorization - Visit Number 9    Authorization - Number of Visits 16    Progress Note Due on Visit 10    PT Start Time 1018    PT Stop Time 1100    PT Time Calculation (min) 42 min    Activity Tolerance Patient tolerated treatment well    Behavior During Therapy WFL for tasks assessed/performed           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   CHF (congestive heart failure) (HCC)    Elevated cholesterol    Gout    Hepatitis 1964   hepatitis A    HFrEF (heart failure with reduced ejection fraction) (HCC)    Hypertension    Moderate mitral regurgitation    Pre-diabetes    Severe aortic stenosis    Past Surgical History:  Procedure Laterality Date   AORTIC VALVE REPLACEMENT N/A 07/24/2017   Procedure: AORTIC VALVE REPLACEMENT (AVR) using 23mm Inspiris Aortic Valve;  Surgeon: Lucas Dorise POUR, MD;  Location: [facility] OR;  Service: Open Heart Surgery;  Laterality: N/A;   CLIPPING OF ATRIAL APPENDAGE N/A 07/24/2017   Procedure: CLIPPING OF ATRIAL APPENDAGE using a 50mm Atricure clip;  Surgeon: Lucas Dorise POUR, MD;  Location: [facility] OR;  Service: Open Heart Surgery;  Laterality: N/A;   CORONARY ARTERY BYPASS GRAFT N/A 07/24/2017   Procedure: CORONARY ARTERY BYPASS GRAFTING (CABG) times 3 using the left greater saphenous vein harvested endoscopically and left internal mammary artery.;  Surgeon: Lucas Dorise POUR, MD;  Location: [facility] OR;  Service: Open Heart Surgery;  Laterality: N/A;   EYE SURGERY Bilateral    lens replacements for cataracts   HERNIA  REPAIR     INGUINAL HERNIA REPAIR Right 05/06/2015   Procedure: LAPAROSCOPIC RIGHT INGUINAL HERNIA WITH MESH;  Surgeon: Vicenta Poli, MD;  Location: [facility] ORS;  Service: General;  Laterality: Right;   INSERTION OF MESH Right 05/06/2015   Procedure: INSERTION OF MESH;  Surgeon: Vicenta Poli, MD;  Location: [facility] ORS;  Service: General;  Laterality: Right;   IR BONE MARROW BIOPSY & ASPIRATION  09/15/2022   RIGHT HEART CATH N/A 03/21/2023   Procedure: RIGHT HEART CATH;  Surgeon: Elmira Newman PARAS, MD;  Location: [facility] INVASIVE CV LAB;  Service: Cardiovascular;  Laterality: N/A;   RIGHT/LEFT HEART CATH AND CORONARY ANGIOGRAPHY N/A 07/10/2017   Procedure: RIGHT/LEFT HEART CATH AND CORONARY ANGIOGRAPHY;  Surgeon: Elmira Newman PARAS, MD;  Location: [facility] INVASIVE CV LAB;  Service: Cardiovascular;  Laterality: N/A;   TEE WITHOUT CARDIOVERSION N/A 07/11/2017   Procedure: TRANSESOPHAGEAL ECHOCARDIOGRAM (TEE);  Surgeon: Elmira Newman PARAS, MD;  Location: [facility] ENDOSCOPY;  Service: Cardiovascular;  Laterality: N/A;   TEE WITHOUT CARDIOVERSION N/A 07/24/2017   Procedure: TRANSESOPHAGEAL ECHOCARDIOGRAM (TEE);  Surgeon: Lucas Dorise POUR, MD;  Location: [facility] OR;  Service: Open Heart Surgery;  Laterality: N/A;   TEE WITHOUT CARDIOVERSION N/A 08/01/2022   Procedure: TRANSESOPHAGEAL ECHOCARDIOGRAM;  Surgeon: Pietro Redell RAMAN, MD;  Location: [facility]  INVASIVE CV LAB;  Service: Cardiovascular;  Laterality: N/A;   TOTAL KNEE ARTHROPLASTY Right 08/07/2019   Procedure: TOTAL KNEE ARTHROPLASTY;  Surgeon: Melodi Lerner, MD;  Location: [facility] ORS;  Service: Orthopedics;  Laterality: Right;   ULTRASOUND GUIDANCE FOR VASCULAR ACCESS  07/10/2017   Procedure: Ultrasound Guidance For Vascular Access;  Surgeon: Elmira Newman PARAS, MD;  Location: [facility] INVASIVE CV LAB;  Service: Cardiovascular;;   Patient Active Problem List   Diagnosis Date Noted   Presence of heart assist device (HCC) 03/29/2023   Leukopenia 09/13/2022   PICC (peripherally inserted  central catheter) in place 08/30/2022   Pure hypercholesterolemia 08/03/2022   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   Encounter for assessment of peripherally inserted central catheter (PICC) 08/02/2022   Aortic valve endocarditis 08/01/2022   Cardiomyopathy (HCC) 08/01/2022   Chronic HFrEF (heart failure with reduced ejection fraction) (HCC) 08/01/2022   Prosthetic cardiac valve vegetation 08/01/2022   Anemia 08/01/2022   Streptococcal bacteremia 07/29/2022   Aortic atherosclerosis 07/29/2022   Medication management 07/29/2022   Bacteremia 07/29/2022   Low back pain 06/24/2022   Constipation 06/24/2022   Renal cyst, 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   PAC (premature atrial contraction) 03/01/2021   PVC (premature ventricular contraction) 03/01/2021   Atrial bigeminy 02/28/2020   OA (osteoarthritis) of knee 08/07/2019   Primary osteoarthritis of right knee 08/07/2019   Pain in right knee 05/17/2019   Coronary artery disease involving coronary bypass graft of native heart without angina pectoris 09/19/2018   Weakness 08/01/2018   Mixed hyperlipidemia 06/06/2018   S/P left atrial appendage ligation 07/25/2017   S/P AVR 07/25/2017   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   Coronary artery disease without angina pectoris 07/12/2017   HFrEF (heart failure with reduced ejection fraction) (HCC) 07/07/2017   Mitral regurgitation 07/07/2017   (HFpEF) heart failure with preserved ejection fraction (HCC) 07/07/2017   Gout 02/12/2013   Benign hypertension 02/12/2013   GERD (gastroesophageal reflux disease) 02/12/2013    PCP: Leonarda Roxan BROCKS, NP  REFERRING PROVIDER: Gil Greig BRAVO, NP  REFERRING DIAG: R26.81 (ICD-10-CM) - Unstable gait R26.89 (ICD-10-CM) - Balance problem  THERAPY DIAG:  Difficulty in walking, not elsewhere classified  Unsteadiness on feet  Muscle weakness (generalized)  Cramp and spasm  Abnormal posture  Rationale for Evaluation and Treatment: Rehabilitation  ONSET DATE:  01/11/2024  SUBJECTIVE:   SUBJECTIVE STATEMENT: It's getting easier to stay tall and straighten my legs out    PERTINENT HISTORY: na PAIN:  02/23/24 Are you having pain? Yes: NPRS scale: 0/10 Pain location: left knee Pain description: aching Aggravating factors: downhill or down steps Relieving factors: rest   PRECAUTIONS: Fall  RED FLAGS: None   WEIGHT BEARING RESTRICTIONS: No  FALLS:  Has patient fallen in last 6 months? Yes. 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  DIAGNOSTIC FINDINGS: na  PATIENT SURVEYS:  LEFS  Extreme difficulty/unable (0), Quite a bit of difficulty (  1), Moderate difficulty (2), Little difficulty (3), No difficulty (4) Survey date:  01/17/24  Any of your usual work, housework or school activities   2. Usual hobbies, recreational or sporting activities   3. Getting into/out of the bath   4. Walking between rooms   5. Putting on socks/shoes   6. Squatting    7. Lifting an object, like a bag of groceries from the floor   8. Performing light activities around your home   9. Performing heavy activities around your home   10. Getting into/out of a car   11. Walking 2 blocks   12. Walking 1 mile   13. Going up/down 10 stairs (1 flight)   14. Standing for 1 hour   15.  sitting for 1 hour   16.  Running on even ground   17. Running on uneven ground   18. Making sharp turns while running fast   19. Hopping    20. Rolling over in bed   Score total:  62/80     COGNITION: Overall cognitive status: Within functional limits for tasks assessed     SENSATION: WFL   MUSCLE LENGTH: Hamstrings: Right 45 deg; Left 40 deg Thomas test: Right pos; Left pos  POSTURE: rounded shoulders and forward head  PALPATION: Mild crepitus   LOWER EXTREMITY ROM:  Right knee flexion : approx 110 degrees,  extension approx -20 Left knee flexion: approx 100 degrees, extension approx -20  LOWER EXTREMITY MMT:   4/5 extension bilaterally   4/5 flexion bilaterally  LOWER EXTREMITY SPECIAL TESTS:  Knee special tests: Patellafemoral grind test: negative  FUNCTIONAL TESTS:  5 times sit to stand: 12.03 sec Timed up and go (TUG): 13.15  GAIT: Distance walked: 30 feet Assistive device utilized: None Level of assistance: Complete Independence Comments: slight deviation on turns but otherwise good heel to toe gait  TREATMENT DATE: 02/23/24 Nustep x 5 min level 6 (PT present to discuss status and progress) Single leg dead lift with 5 lbs 2 x 10 each leg (standing T with opposing UE support) Marching in place with opposite arm and leg using 2 lbs dumbbells Wing taps facing wall 2 x 10 Reach and pull downs facing wall 2 x 10 Cone touches 3 x 10 each LE (had to raise cone several times, ended up on counter top) Squat to chair x 10 immediately followed by pulse squat x 15 sec, then squat hold x 15 sec   TREATMENT DATE: 02/21/24 Nustep x 5 min level 7 (PT present to discuss status and progress) 5 point star drill with slider x 5 each LE Wide sweep with slider in standing for balance and hip mobility x 5 each LE with min UE support at barre  Carioca x 5 laps of 5 steps each way Sit to stand with 15 lb kb x 10 Squat to table with 15 lb kb x 10 Step up and hold x 10 fwd then x 10 lateral  Obstacle  course with unlevel surface using cobble boards and balance pads, turns and hurdles for increased step length for balance training.  5 laps with cg to min assist.   TREATMENT DATE: 02/16/24 Recumbent bike level 1 x 10 min (PT present to discuss status and progress) Carioca x 3 laps of 10 steps each way Hip matrix 40 lbs 2 x 10 hip abduction then hip extension  Leg Press 2 x 10 90 lbs Sit to stand with 15 lb kb x 10 Squat to table  with 15 lb kb x 10 Isometric hip adduction with purple ball x 20 Isometric hip adduction with LAQ 2 x 10 with 6 lb ankle weight Lunge to BOSU x 10 fwd each LE and x 10 lateral each   TREATMENT DATE: 02/14/24 Nustep level 7 x 8 min (PT present to discuss status and progress) Rocker board x 2 min for ankle mobility Gastroc stretch x 10 holding 10 sec each Carioca x 3 laps of 15 feet Cross stance x 5 trying to hold 10 sec each Seated piriformis stretch x 5 hold 10 sec each Cross leg stretch x 5 holding 10 sec each Wide sweep with slider in standing for balance and hip mobility x 10 each LE with min assist then at barre x 10 each LE Backward lunge using slider at barre with single UE support x 10 ea Gait training in  front of mirror x 8 min                                                                                                                              TREATMENT DATE: 01/17/24 Initial eval completed and initial HEP established Educated on fall prevention and fall risk   PATIENT EDUCATION:  Education details: Discussed appropriate level of exercise and reason for falls, fall prevention Person educated: Patient Education method: Programmer, Multimedia, Demonstration, Verbal cues, and Handouts Education comprehension: verbalized understanding, returned demonstration, and verbal cues required  HOME EXERCISE PROGRAM: Access Code: [email or web address] URL: [email or web address] Date: 02/14/2024 Prepared by: Delon Haddock  Exercises - Standing Hamstring  Stretch on Chair  - 1 x daily - 7 x weekly - 1 sets - 3 reps - 30 sec hold - Quadricep Stretch with Chair and Counter Support  - 1 x daily - 7 x weekly - 1 sets - 3 reps - 30 sec hold - Seated Figure 4 Piriformis Stretch  - 1 x daily - 7 x weekly - 1 sets - 3 reps - 30 sed hold - Sidelying Open Book Thoracic Lumbar Rotation and Extension  - 1 x daily - 7 x weekly - 1 sets - 10 reps - 5 sec hold - Single Leg Cone Touch  - 1 x daily - 7 x weekly - 3 sets - 10 reps - Side Stepping with Resistance at Ankles  - 1 x daily - 7 x weekly - 3 sets - 10 reps - Reverse Lunge With Slider at Counter  - 1 x daily - 7 x weekly - 3 sets - 10 reps - Lateral Lunge With Slider  - 1 x daily - 7 x weekly - 3 sets - 10 reps  ASSESSMENT:  CLINICAL IMPRESSION: [place] is progressing appropriately.  We added some dynamic single leg balance tasks today.  He did struggle with this quite a bit initially but responded well to verbal cues to use various balance strategies including engaging his core and glut medius.  He would benefit from continuing skilled PT  for quad rehab, balance training, gait training and fall prevention.    OBJECTIVE IMPAIRMENTS: Abnormal gait, decreased balance, difficulty walking, decreased strength, hypomobility, increased fascial restrictions, increased muscle spasms, impaired flexibility, improper body mechanics, postural dysfunction, and pain.   ACTIVITY LIMITATIONS: carrying, lifting, standing, squatting, sleeping, stairs, transfers, bathing, toileting, dressing, hygiene/grooming, and caring for others  PARTICIPATION LIMITATIONS: meal prep, cleaning, laundry, interpersonal relationship, driving, shopping, and yard work  PERSONAL FACTORS: Age, Fitness, Past/current experiences, and 1-2 comorbidities: pacemaker Htn, CHF are also affecting patient's functional outcome.   REHAB POTENTIAL: Good  CLINICAL DECISION MAKING: Stable/uncomplicated  EVALUATION COMPLEXITY: Low   GOALS: Goals reviewed  with patient? Yes  SHORT TERM GOALS: Target date: 02/14/2024  Pain report to be no greater than 4/10  Baseline: Goal status: Met 01/19/24  2.  Patient will be independent with initial HEP  Baseline:  Goal status: MET 01/30/24   LONG TERM GOALS: Target date: 03/13/2024  Patient to report pain no greater than 2/10  Baseline:  Goal status: MET 01/30/24  2.  Patient to be independent with advanced HEP  Baseline:  Goal status: MET 02/16/24  3.  BERG to improve by 2-3 points Baseline:  Goal status: In progress  4.  Functional tests to improve by 2-3 sec Baseline:  Goal status: In progress  5.  No falls during PT episode Baseline: had one fall due to knee instability while doing a putting maneuver at disc golf on 02/15/23 Goal status: In progress  6.  Patient to be able to ascend and descend steps without pain or no greater than 2/10  Baseline:  Goal status: In progress   PLAN:  PT FREQUENCY: 2x/week  PT DURATION: 8 weeks  PLANNED INTERVENTIONS: 97110-Therapeutic exercises, 97530- Therapeutic activity, [id number]- Neuromuscular re-education, [phone]- Self Care, 02859- Manual therapy, [phone]- Gait training, [phone]- Canalith repositioning, [id number]- Aquatic Therapy, [phone]- Electrical stimulation (unattended), [phone]- Electrical stimulation (manual), [id number]- Vasopneumatic device, [id number]- Ultrasound, [id number]- Traction (mechanical), [id number]- Ionotophoresis 4mg /ml Dexamethasone , 79439 (1-2 muscles), 20561 (3+ muscles)- Dry Needling, Patient/Family education, Balance training, Stair training, Taping, Joint mobilization, Spinal mobilization, Vestibular training, DME instructions, Cryotherapy, and Moist heat  PLAN FOR NEXT SESSION: Nustep, continue agility skills, slider work for balance, progress carioca and hurdles, progress quad rehab and balance training   Colette Dicamillo B. Nickolai Rinks, PT 02/23/24 11:57 AM [facility] [address], [address] [place], [place] [address] Phone #  [phone] Fax [phone]  "

## 2024-02-26 NOTE — Progress Notes (Unsigned)
 " Cardiology Office Note   Date:  02/27/2024  ID:  Munir Victorian, DOB 20-Dec-1940, MRN [id number] PCP: Leonarda Roxan BROCKS, NP  [facility] Providers Cardiologist:  Newman JINNY Lawrence, MD   History of Present Illness [name] is a 84 y.o. male with a past medical history of CAD status post CABG x 3 (LIMA to LAD, SVG to distal LCx, SVG to RCA), severe AS treated with bioprosthetic AVR (23 mm Edwards Inspiris Resilia pericardial valve), LAA clipping in 08/2017, bacteremia and prosthetic valve endocarditis with moderate AAS (07/2022), HFrEF now recovered EF to 50% (09/2022), history of prosthetic valve endocarditis (07/2022) with moderate AAS, no AI treated with 6 weeks of IV Rocephin , now on oral cefadroxil .  He was seen March of last year.  At that time he reported generalized fatigue but denied any chest pain or shortness of breath symptoms.  Trying to increase physical activity and plays disc golf regularly.  Today, he presented with a hx of coronary artery disease with increased fatigue.  Over the past month, he has had increased fatigue and gets tired more easily during usual activities like disc golf, needing more rest. He notes this feels similar to his symptoms before his triple bypass and valve replacement in 2018 and is worried about a cardiac cause.  He had triple bypass and valve replacement in 2018. He recalls a stress test in 2024. He takes high dose atorvastatin  daily but is unsure of timing and is considering dose reduction due to concern that it may contribute to his fatigue.  He had a serious blood infection about two years ago (endocarditis) with multiple hospitalizations over two months and remains on chronic antibiotics. The original source of infection is unclear to him.  He had prosthetic valve endocarditis with prior life-threatening complications including severe anemia. He has a heart murmur. His last echocardiogram was in February 2025.  He remains active with  disc golf, e-bike use, [employer] workouts, and about 6,000 steps per day, but has noticed reduced exercise tolerance. He has PACs without perceived symptoms. He started metoprolol  in April of last year after his monitor that shows PACs/PVCs.  Reports no shortness of breath nor dyspnea on exertion. Reports no chest pain, pressure, or tightness. No edema, orthopnea, PND. Reports no palpitations.   Discussed the use of AI scribe software for clinical note transcription with the patient, who gave verbal consent to proceed.  ROS: Pertinent ROS in HPI  Studies Reviewed EKG Interpretation Date/Time:  Tuesday February 27 2024 15:01:19 EST Ventricular Rate:  66 PR Interval:  210 QRS Duration:  120 QT Interval:  450 QTC Calculation: 471 R Axis:   29  Text Interpretation: Sinus rhythm with 1st degree A-V block with Premature atrial complexes Non-specific intra-ventricular conduction delay When compared with ECG of 10-Apr-2023 09:13, Premature ventricular complexes are no longer Present Sinus rhythm is no longer with ventricular escape complexes Non-specific intra-ventricular conduction delay has replaced Right bundle branch block Confirmed by Lucien Blanc [phone]) on 02/27/2024 4:08:17 PM    Right heart catheterization 03/21/2023: RA: 4 mmHg RV: 27/0 mmHg PA: 28/9 mmHg, mPAP 16 mmHg PCW: 16 mmHg   AO sats: 100% PA sats: 68%   CO: 5.7 L/min CI: 2.9 L/min/m2   Direct right heart pressure measurement does not correlate with recent ehccoardiographic measurement of RVSP 74 mmHg.   Echocardiogram 03/13/2023: Left ventricle cavity is normal in size. Mild concentric hypertrophy of the left ventricle. Mild global hypokinesis. LVEF around 50%. Doppler evidence of grade  II (pseudonormal) diastolic dysfunction, elevated LAP.  Left atrial cavity is severely dilated. Prosthetic23 mm Edwards inspiris resilia pericardial valve. Mobile echodense structure attached on aortic side of the valve, consistent with  vegetation. Moderate aortic stenosis. Vmax 2.4 m/sec, mean PG 11 mmHg, AVA 1.2 cm by continuity equation. No aortic valve regurgitation. Structurally normal mitral valve. Moderate (Grade II) mitral regurgitation. Mild to moderate tricuspid regurgitation. No evidence of pulmonary hypertension. Compared to previous studies in [address]. EF has improved from 35-40%. Aortic vegetation remains.  Physical Exam VS:  BP 116/68   Pulse 72   Ht 5' 11 (1.803 m)   Wt 172 lb (78 kg)   SpO2 97%   BMI 23.99 kg/m        Wt Readings from Last 3 Encounters:  02/27/24 172 lb (78 kg)  01/11/24 175 lb 9.6 oz (79.7 kg)  10/11/23 172 lb (78 kg)    GEN: Well nourished, well developed in no acute distress NECK: No JVD; No carotid bruits CARDIAC: RRR, no murmurs, rubs, gallops RESPIRATORY:  Clear to auscultation without rales, wheezing or rhonchi  ABDOMEN: Soft, non-tender, non-distended EXTREMITIES:  No edema; No deformity   ASSESSMENT AND PLAN  Coronary artery disease with angina Increased fatigue possibly related to coronary artery disease or atorvastatin  side effects. Previous bypass and valve replacement. LDL optimal. Discussed atorvastatin  dose; maintaining current dose preferred to prevent disease progression. Reassured him about statin efficacy in reducing cardiac events. - Ordered treadmill stress test to assess for disease progression. - Recommend lipid panel to reassess LDL levels (with PCP) - Continue current atorvastatin  dose unless lipid panel indicates otherwise.  Heart failure with reduced ejection fraction Heart pump function low normal. No symptoms of exacerbation discussed. - Ordered echocardiogram to assess heart function and prosthetic valve status.  Prosthetic valve endocarditis, ongoing management Ongoing antibiotic management due to previous severe infection. Fatigue may relate to infection management. - Continue current antibiotic regimen.  Premature atrial and ventricular  contractions PACs noted on EKG. No PVCs. Potential fatigue contributor if frequency increases. Previous monitor showed frequent extra beats. Metoprolol  dose adjustment limited by low normal blood pressure. - Consider heart monitor if stress test is normal and fatigue persists.  Mitral valve regurgitation Mild to moderate regurgitation noted on previous echocardiogram. No symptoms discussed. - Ordered echocardiogram to reassess mitral valve regurgitation.     Dispo: He can follow-up in 6 weeks after testing.   Signed, Orren LOISE Fabry, PA-C   "

## 2024-02-27 ENCOUNTER — Encounter: Payer: Self-pay | Admitting: Physician Assistant

## 2024-02-27 ENCOUNTER — Ambulatory Visit: Attending: Physician Assistant | Admitting: Physician Assistant

## 2024-02-27 VITALS — BP 116/68 | HR 72 | Ht 71.0 in | Wt 172.0 lb

## 2024-02-27 DIAGNOSIS — T826XXD Infection and inflammatory reaction due to cardiac valve prosthesis, subsequent encounter: Secondary | ICD-10-CM

## 2024-02-27 DIAGNOSIS — I493 Ventricular premature depolarization: Secondary | ICD-10-CM | POA: Diagnosis not present

## 2024-02-27 DIAGNOSIS — I5020 Unspecified systolic (congestive) heart failure: Secondary | ICD-10-CM

## 2024-02-27 DIAGNOSIS — I25118 Atherosclerotic heart disease of native coronary artery with other forms of angina pectoris: Secondary | ICD-10-CM | POA: Diagnosis not present

## 2024-02-27 DIAGNOSIS — I491 Atrial premature depolarization: Secondary | ICD-10-CM

## 2024-02-27 DIAGNOSIS — I330 Acute and subacute infective endocarditis: Secondary | ICD-10-CM

## 2024-02-27 NOTE — Patient Instructions (Addendum)
 Medication Instructions:  TAKE YOUR LIPITOR (ATORVASTATIN ) IN THE EVENING  *If you need a refill on your cardiac medications before your next appointment, please call your pharmacy*  Lab Work: COMPLETE LABS AT YOUR PCP AND SEND OUR OFFICE A COPY If you have labs (blood work) drawn today and your tests are completely normal, you will receive your results only by: MyChart Message (if you have MyChart) OR A paper copy in the mail If you have any lab test that is abnormal or we need to change your treatment, we will call you to review the results.  Testing/Procedures: Your physician has requested that you have an echocardiogram. Echocardiography is a painless test that uses sound waves to create images of your heart. It provides your doctor with information about the size and shape of your heart and how well your hearts chambers and valves are working. This procedure takes approximately one hour. There are no restrictions for this procedure. Please do NOT wear cologne, perfume, aftershave, or lotions (deodorant is allowed). Please arrive 15 minutes prior to your appointment time.  Please note: We ask at that you not bring children with you during ultrasound (echo/ vascular) testing. Due to room size and safety concerns, children are not allowed in the ultrasound rooms during exams. Our front office staff cannot provide observation of children in our lobby area while testing is being conducted. An adult accompanying a patient to their appointment will only be allowed in the ultrasound room at the discretion of the ultrasound technician under special circumstances. We apologize for any inconvenience.  Your physician has requested that you have an exercise stress myoview . For further information please visit [email or web address] Please follow instruction sheet, as given.   Follow-Up: At [facility], you and your health needs are our priority.  As part of our continuing mission to provide  you with exceptional heart care, our providers are all part of one team.  This team includes your primary Cardiologist (physician) and Advanced Practice Providers or APPs (Physician Assistants and Nurse Practitioners) who all work together to provide you with the care you need, when you need it.  Your next appointment:   6-8 week(s)  Provider:   Newman JINNY Lawrence, MD or Orren Fabry, PA    We recommend signing up for the patient portal called MyChart.  Sign up information is provided on this After Visit Summary.  MyChart is used to connect with patients for Virtual Visits (Telemedicine).  Patients are able to view lab/test results, encounter notes, upcoming appointments, etc.  Non-urgent messages can be sent to your provider as well.   To learn more about what you can do with MyChart, go to [email or web address].   Other Instructions

## 2024-02-28 ENCOUNTER — Ambulatory Visit

## 2024-02-28 DIAGNOSIS — R2681 Unsteadiness on feet: Secondary | ICD-10-CM

## 2024-02-28 DIAGNOSIS — R293 Abnormal posture: Secondary | ICD-10-CM

## 2024-02-28 DIAGNOSIS — R262 Difficulty in walking, not elsewhere classified: Secondary | ICD-10-CM

## 2024-02-28 DIAGNOSIS — R252 Cramp and spasm: Secondary | ICD-10-CM

## 2024-02-28 DIAGNOSIS — M6281 Muscle weakness (generalized): Secondary | ICD-10-CM

## 2024-02-28 NOTE — Therapy (Signed)
 " OUTPATIENT PHYSICAL THERAPY LOWER EXTREMITY TREATMENT   Patient Name: [name] MRN: [id number] DOB:04/22/1940, 84 y.o., male Today's Date: 02/28/2024  END OF SESSION:  PT End of Session - 02/28/24 1117     Visit Number 10    Number of Visits 16    Date for Recertification  03/13/24    Authorization Type [employer]    Authorization Time Period 01/17/24 through 04/16/24    Authorization - Visit Number 10    Authorization - Number of Visits 16    Progress Note Due on Visit 20    PT Start Time 1100    PT Stop Time 1145    PT Time Calculation (min) 45 min    Activity Tolerance Patient tolerated treatment well    Behavior During Therapy WFL for tasks assessed/performed           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   CHF (congestive heart failure) (HCC)    Elevated cholesterol    Gout    Hepatitis 1964   hepatitis A    HFrEF (heart failure with reduced ejection fraction) (HCC)    Hypertension    Moderate mitral regurgitation    Pre-diabetes    Severe aortic stenosis    Past Surgical History:  Procedure Laterality Date   AORTIC VALVE REPLACEMENT N/A 07/24/2017   Procedure: AORTIC VALVE REPLACEMENT (AVR) using 23mm Inspiris Aortic Valve;  Surgeon: Lucas Dorise POUR, MD;  Location: [facility] OR;  Service: Open Heart Surgery;  Laterality: N/A;   CLIPPING OF ATRIAL APPENDAGE N/A 07/24/2017   Procedure: CLIPPING OF ATRIAL APPENDAGE using a 50mm Atricure clip;  Surgeon: Lucas Dorise POUR, MD;  Location: [facility] OR;  Service: Open Heart Surgery;  Laterality: N/A;   CORONARY ARTERY BYPASS GRAFT N/A 07/24/2017   Procedure: CORONARY ARTERY BYPASS GRAFTING (CABG) times 3 using the left greater saphenous vein harvested endoscopically and left internal mammary artery.;  Surgeon: Lucas Dorise POUR, MD;  Location: [facility] OR;  Service: Open Heart Surgery;  Laterality: N/A;   EYE SURGERY Bilateral    lens replacements for cataracts    HERNIA REPAIR     INGUINAL HERNIA REPAIR Right 05/06/2015   Procedure: LAPAROSCOPIC RIGHT INGUINAL HERNIA WITH MESH;  Surgeon: Vicenta Poli, MD;  Location: [facility] ORS;  Service: General;  Laterality: Right;   INSERTION OF MESH Right 05/06/2015   Procedure: INSERTION OF MESH;  Surgeon: Vicenta Poli, MD;  Location: [facility] ORS;  Service: General;  Laterality: Right;   IR BONE MARROW BIOPSY & ASPIRATION  09/15/2022   RIGHT HEART CATH N/A 03/21/2023   Procedure: RIGHT HEART CATH;  Surgeon: Elmira Newman PARAS, MD;  Location: [facility] INVASIVE CV LAB;  Service: Cardiovascular;  Laterality: N/A;   RIGHT/LEFT HEART CATH AND CORONARY ANGIOGRAPHY N/A 07/10/2017   Procedure: RIGHT/LEFT HEART CATH AND CORONARY ANGIOGRAPHY;  Surgeon: Elmira Newman PARAS, MD;  Location: [facility] INVASIVE CV LAB;  Service: Cardiovascular;  Laterality: N/A;   TEE WITHOUT CARDIOVERSION N/A 07/11/2017   Procedure: TRANSESOPHAGEAL ECHOCARDIOGRAM (TEE);  Surgeon: Elmira Newman PARAS, MD;  Location: [facility] ENDOSCOPY;  Service: Cardiovascular;  Laterality: N/A;   TEE WITHOUT CARDIOVERSION N/A 07/24/2017   Procedure: TRANSESOPHAGEAL ECHOCARDIOGRAM (TEE);  Surgeon: Lucas Dorise POUR, MD;  Location: [facility] OR;  Service: Open Heart Surgery;  Laterality: N/A;   TEE WITHOUT CARDIOVERSION N/A 08/01/2022   Procedure: TRANSESOPHAGEAL ECHOCARDIOGRAM;  Surgeon: Pietro Redell RAMAN, MD;  Location: [facility]  INVASIVE CV LAB;  Service: Cardiovascular;  Laterality: N/A;   TOTAL KNEE ARTHROPLASTY Right 08/07/2019   Procedure: TOTAL KNEE ARTHROPLASTY;  Surgeon: Melodi Lerner, MD;  Location: [facility] ORS;  Service: Orthopedics;  Laterality: Right;   ULTRASOUND GUIDANCE FOR VASCULAR ACCESS  07/10/2017   Procedure: Ultrasound Guidance For Vascular Access;  Surgeon: Elmira Newman PARAS, MD;  Location: [facility] INVASIVE CV LAB;  Service: Cardiovascular;;   Patient Active Problem List   Diagnosis Date Noted   Presence of heart assist device (HCC) 03/29/2023   Leukopenia 09/13/2022   PICC (peripherally  inserted central catheter) in place 08/30/2022   Pure hypercholesterolemia 08/03/2022   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   Encounter for assessment of peripherally inserted central catheter (PICC) 08/02/2022   Aortic valve endocarditis 08/01/2022   Cardiomyopathy (HCC) 08/01/2022   Chronic HFrEF (heart failure with reduced ejection fraction) (HCC) 08/01/2022   Prosthetic cardiac valve vegetation 08/01/2022   Anemia 08/01/2022   Streptococcal bacteremia 07/29/2022   Aortic atherosclerosis 07/29/2022   Medication management 07/29/2022   Bacteremia 07/29/2022   Low back pain 06/24/2022   Constipation 06/24/2022   Renal cyst, 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   PAC (premature atrial contraction) 03/01/2021   PVC (premature ventricular contraction) 03/01/2021   Atrial bigeminy 02/28/2020   OA (osteoarthritis) of knee 08/07/2019   Primary osteoarthritis of right knee 08/07/2019   Pain in right knee 05/17/2019   Coronary artery disease involving coronary bypass graft of native heart without angina pectoris 09/19/2018   Weakness 08/01/2018   Mixed hyperlipidemia 06/06/2018   S/P left atrial appendage ligation 07/25/2017   S/P AVR 07/25/2017   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   Coronary artery disease without angina pectoris 07/12/2017   HFrEF (heart failure with reduced ejection fraction) (HCC) 07/07/2017   Mitral regurgitation 07/07/2017   (HFpEF) heart failure with preserved ejection fraction (HCC) 07/07/2017   Gout 02/12/2013   Benign hypertension 02/12/2013   GERD (gastroesophageal reflux disease) 02/12/2013    PCP: Leonarda Roxan BROCKS, NP  REFERRING PROVIDER: Gil Greig BRAVO, NP  REFERRING DIAG: R26.81 (ICD-10-CM) - Unstable gait R26.89 (ICD-10-CM) - Balance problem  THERAPY DIAG:  Difficulty in walking, not elsewhere classified  Unsteadiness on feet  Muscle weakness (generalized)  Cramp and spasm  Abnormal posture  Rationale for Evaluation and Treatment: Rehabilitation  ONSET DATE:  01/11/2024  SUBJECTIVE:   SUBJECTIVE STATEMENT: I was doing my figure 4 stretch and felt a pop at my lateral knee.  It's been kind of aching.  I have been icing it and it's feeling better.  I was probably being a little aggressive    PERTINENT HISTORY: na PAIN:  02/28/24 Are you having pain? Yes: NPRS scale: 0/10 Pain location: left knee Pain description: aching Aggravating factors: downhill or down steps Relieving factors: rest   PRECAUTIONS: Fall  RED FLAGS: None   WEIGHT BEARING RESTRICTIONS: No  FALLS:  Has patient fallen in last 6 months? Yes. 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  DIAGNOSTIC FINDINGS: na  PATIENT SURVEYS:  LEFS  Extreme difficulty/unable (0), Quite a bit of difficulty (1), Moderate difficulty (2), Little difficulty (3), No difficulty (4) Survey date:  01/17/24  Any of your usual work, housework or school activities   2. Usual hobbies, recreational or sporting activities   3. Getting into/out of the bath   4. Walking between rooms   5. Putting on socks/shoes   6. Squatting    7. Lifting an object, like a bag of groceries from the floor   8. Performing light activities around your home   9. Performing heavy activities around your home   10. Getting into/out of a car   11. Walking 2 blocks    12. Walking 1 mile   13. Going up/down 10 stairs (1 flight)   14. Standing for 1 hour   15.  sitting for 1 hour   16. Running on even ground   17. Running on uneven ground   18. Making sharp turns while running fast   19. Hopping    20. Rolling over in bed   Score total:  62/80     COGNITION: Overall cognitive status: Within functional limits for tasks assessed     SENSATION: WFL   MUSCLE LENGTH: Hamstrings: Right 45 deg; Left 40 deg Thomas test: Right pos; Left pos  POSTURE: rounded shoulders and forward head  PALPATION: Mild crepitus   LOWER EXTREMITY ROM:  Right knee flexion : approx 110 degrees,  extension approx -20 Left knee flexion: approx 100 degrees, extension approx -20  LOWER EXTREMITY MMT:   4/5 extension bilaterally   4/5 flexion bilaterally  LOWER EXTREMITY SPECIAL TESTS:  Knee special tests: Patellafemoral grind test: negative  FUNCTIONAL TESTS:  5 times sit to stand: 12.03 sec Timed up and go (TUG): 13.15  GAIT: Distance walked: 30 feet Assistive device utilized: None Level of assistance: Complete Independence Comments: slight deviation on turns but otherwise good heel to toe gait  TREATMENT DATE: 02/28/24 Nustep x 8 min level 7 (PT present to discuss status and progress) Demonstrated melt balls to lateral leg/distal IT band, as well as roller to mid IT band  Hip adduction isometric with purple ball x 20 Sit to stand with purple ball for adduction x 10 Squat to table with purple ball for adduction x 10 Lunges fwd x 10 each leg with cga Lunges lateral x 10 each with sba Fwd T x 10 each LE  Marching in place with opposite arm and leg using 2 lbs dumbbells Marching in place with cross body hand to knee x 20 Wing taps facing wall 2 x 10 Reach and pull downs facing wall 2 x 10 Cone touches 3 x 10 each LE (had to raise cone several times, ended up on counter top)  TREATMENT DATE: 02/23/24 Nustep x 5 min level 6 (PT present to discuss status  and progress) Single leg dead lift with 5 lbs 2 x 10 each leg (standing T with opposing UE support) Marching in place with opposite arm and leg using 2 lbs dumbbells Wing taps facing wall 2 x 10 Reach and pull downs facing wall 2 x 10 Cone touches 3 x 10 each LE (had to raise cone several times, ended up on counter top) Squat to chair x 10 immediately followed by pulse squat x 15 sec, then squat hold x 15 sec   TREATMENT DATE: 02/21/24 Nustep x 5 min  level 7 (PT present to discuss status and progress) 5 point star drill with slider x 5 each LE Wide sweep with slider in standing for balance and hip mobility x 5 each LE with min UE support at barre  Carioca x 5 laps of 5 steps each way Sit to stand with 15 lb kb x 10 Squat to table with 15 lb kb x 10 Step up and hold x 10 fwd then x 10 lateral  Obstacle course with unlevel surface using cobble boards and balance pads, turns and hurdles for increased step length for balance training.  5 laps with cg to min assist.                                        TREATMENT DATE: 01/17/24 Initial eval completed and initial HEP established Educated on fall prevention and fall risk   PATIENT EDUCATION:  Education details: Discussed appropriate level of exercise and reason for falls, fall prevention Person educated: Patient Education method: Programmer, Multimedia, Demonstration, Verbal cues, and Handouts Education comprehension: verbalized understanding, returned demonstration, and verbal cues required  HOME EXERCISE PROGRAM: Access Code: [email or web address] URL: [email or web address] Date: 02/14/2024 Prepared by: Delon Haddock  Exercises - Standing Hamstring Stretch on Chair  - 1 x daily - 7 x weekly - 1 sets - 3 reps - 30 sec hold - Quadricep Stretch with Chair and Counter Support  - 1 x daily - 7 x weekly - 1 sets - 3 reps - 30 sec hold - Seated Figure 4 Piriformis Stretch  - 1 x daily - 7 x weekly - 1 sets - 3 reps - 30 sed hold - Sidelying Open  Book Thoracic Lumbar Rotation and Extension  - 1 x daily - 7 x weekly - 1 sets - 10 reps - 5 sec hold - Single Leg Cone Touch  - 1 x daily - 7 x weekly - 3 sets - 10 reps - Side Stepping with Resistance at Ankles  - 1 x daily - 7 x weekly - 3 sets - 10 reps - Reverse Lunge With Slider at Counter  - 1 x daily - 7 x weekly - 3 sets - 10 reps - Lateral Lunge With Slider  - 1 x daily - 7 x weekly - 3 sets - 10 reps  ASSESSMENT:  CLINICAL IMPRESSION: [place] continues to make excellent gains in confidence and function.  He still struggles with coordination but he is working on this diligently.  He was able to do more consecutive reps of cone touches today without lob.  He could only do 1 or 2 reps last visit but did around 5 -10 on each today.  He remains well motivated and compliant.  He should continue to do well.   He would benefit from continuing skilled PT for quad rehab, balance training, gait training and fall prevention.    OBJECTIVE IMPAIRMENTS: Abnormal gait, decreased balance, difficulty walking, decreased strength, hypomobility, increased fascial restrictions, increased muscle spasms, impaired flexibility, improper body mechanics, postural dysfunction, and pain.   ACTIVITY LIMITATIONS: carrying, lifting, standing, squatting, sleeping, stairs, transfers, bathing, toileting, dressing, hygiene/grooming, and caring for others  PARTICIPATION LIMITATIONS: meal prep, cleaning, laundry, interpersonal relationship, driving, shopping, and yard work  PERSONAL FACTORS: Age, Fitness, Past/current experiences, and 1-2 comorbidities: pacemaker Htn, CHF are also affecting patient's functional outcome.   REHAB POTENTIAL: Good  CLINICAL DECISION MAKING: Stable/uncomplicated  EVALUATION COMPLEXITY:  Low   GOALS: Goals reviewed with patient? Yes  SHORT TERM GOALS: Target date: 02/14/2024  Pain report to be no greater than 4/10  Baseline: Goal status: Met 01/19/24  2.  Patient will be independent with  initial HEP  Baseline:  Goal status: MET 01/30/24   LONG TERM GOALS: Target date: 03/13/2024  Patient to report pain no greater than 2/10  Baseline:  Goal status: MET 01/30/24  2.  Patient to be independent with advanced HEP  Baseline:  Goal status: MET 02/16/24  3.  BERG to improve by 2-3 points Baseline:  Goal status: In progress  4.  Functional tests to improve by 2-3 sec Baseline:  Goal status: In progress  5.  No falls during PT episode Baseline: had one fall due to knee instability while doing a putting maneuver at disc golf on 02/15/23 Goal status: In progress  6.  Patient to be able to ascend and descend steps without pain or no greater than 2/10  Baseline:  Goal status: In progress   PLAN:  PT FREQUENCY: 2x/week  PT DURATION: 8 weeks  PLANNED INTERVENTIONS: 97110-Therapeutic exercises, 97530- Therapeutic activity, [id number]- Neuromuscular re-education, [phone]- Self Care, 02859- Manual therapy, [phone]- Gait training, [phone]- Canalith repositioning, [id number]- Aquatic Therapy, [phone]- Electrical stimulation (unattended), [phone]- Electrical stimulation (manual), [id number]- Vasopneumatic device, [id number]- Ultrasound, [id number]- Traction (mechanical), [id number]- Ionotophoresis 4mg /ml Dexamethasone , [id number] (1-2 muscles), [id number] (3+ muscles)- Dry Needling, Patient/Family education, Balance training, Stair training, Taping, Joint mobilization, Spinal mobilization, Vestibular training, DME instructions, Cryotherapy, and Moist heat  PLAN FOR NEXT SESSION: Nustep, continue agility skills, slider work for balance, progress carioca and hurdles, progress quad rehab and balance training   Kolleen Ochsner B. Jenisa Monty, PT 02/28/24 12:30 PM [facility] [address], [address] [place], [place] [address] Phone # [phone] Fax [phone]  "

## 2024-02-29 ENCOUNTER — Telehealth (HOSPITAL_COMMUNITY): Payer: Self-pay | Admitting: *Deleted

## 2024-02-29 NOTE — Telephone Encounter (Signed)
 Left detailed instructions for stress test.

## 2024-03-01 ENCOUNTER — Ambulatory Visit

## 2024-03-01 DIAGNOSIS — R293 Abnormal posture: Secondary | ICD-10-CM

## 2024-03-01 DIAGNOSIS — R262 Difficulty in walking, not elsewhere classified: Secondary | ICD-10-CM

## 2024-03-01 DIAGNOSIS — R2681 Unsteadiness on feet: Secondary | ICD-10-CM

## 2024-03-01 DIAGNOSIS — R252 Cramp and spasm: Secondary | ICD-10-CM

## 2024-03-01 DIAGNOSIS — M6281 Muscle weakness (generalized): Secondary | ICD-10-CM

## 2024-03-01 NOTE — Therapy (Signed)
 " OUTPATIENT PHYSICAL THERAPY LOWER EXTREMITY TREATMENT   Patient Name: [name] MRN: [id number] DOB:May 29, 1940, 84 y.o., male Today's Date: 03/01/2024  END OF SESSION:  PT End of Session - 03/01/24 1021     Visit Number 11    Number of Visits 16    Date for Recertification  03/13/24    Authorization Type [employer]    Authorization Time Period 01/17/24 through 04/16/24    Authorization - Visit Number 11    Authorization - Number of Visits 16    Progress Note Due on Visit 20    PT Start Time 1015    PT Stop Time 1058    PT Time Calculation (min) 43 min    Activity Tolerance Patient tolerated treatment well    Behavior During Therapy WFL for tasks assessed/performed           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   CHF (congestive heart failure) (HCC)    Elevated cholesterol    Gout    Hepatitis 1964   hepatitis A    HFrEF (heart failure with reduced ejection fraction) (HCC)    Hypertension    Moderate mitral regurgitation    Pre-diabetes    Severe aortic stenosis    Past Surgical History:  Procedure Laterality Date   AORTIC VALVE REPLACEMENT N/A 07/24/2017   Procedure: AORTIC VALVE REPLACEMENT (AVR) using 23mm Inspiris Aortic Valve;  Surgeon: Lucas Dorise POUR, MD;  Location: [facility] OR;  Service: Open Heart Surgery;  Laterality: N/A;   CLIPPING OF ATRIAL APPENDAGE N/A 07/24/2017   Procedure: CLIPPING OF ATRIAL APPENDAGE using a 50mm Atricure clip;  Surgeon: Lucas Dorise POUR, MD;  Location: [facility] OR;  Service: Open Heart Surgery;  Laterality: N/A;   CORONARY ARTERY BYPASS GRAFT N/A 07/24/2017   Procedure: CORONARY ARTERY BYPASS GRAFTING (CABG) times 3 using the left greater saphenous vein harvested endoscopically and left internal mammary artery.;  Surgeon: Lucas Dorise POUR, MD;  Location: [facility] OR;  Service: Open Heart Surgery;  Laterality: N/A;   EYE SURGERY Bilateral    lens replacements for cataracts    HERNIA REPAIR     INGUINAL HERNIA REPAIR Right 05/06/2015   Procedure: LAPAROSCOPIC RIGHT INGUINAL HERNIA WITH MESH;  Surgeon: Vicenta Poli, MD;  Location: [facility] ORS;  Service: General;  Laterality: Right;   INSERTION OF MESH Right 05/06/2015   Procedure: INSERTION OF MESH;  Surgeon: Vicenta Poli, MD;  Location: [facility] ORS;  Service: General;  Laterality: Right;   IR BONE MARROW BIOPSY & ASPIRATION  09/15/2022   RIGHT HEART CATH N/A 03/21/2023   Procedure: RIGHT HEART CATH;  Surgeon: Elmira Newman PARAS, MD;  Location: [facility] INVASIVE CV LAB;  Service: Cardiovascular;  Laterality: N/A;   RIGHT/LEFT HEART CATH AND CORONARY ANGIOGRAPHY N/A 07/10/2017   Procedure: RIGHT/LEFT HEART CATH AND CORONARY ANGIOGRAPHY;  Surgeon: Elmira Newman PARAS, MD;  Location: [facility]VAS[facility];  Service: Cardiovascular;  Laterality: N/A;   TEE WITHOUT CARDIOVERSION N/A 07/11/2017   Procedure: TRANSESOPHAGEAL ECHOCARDIOGRAM (TEE);  Surgeon: Elmira Newman PARAS, MD;  Location: [facility] ENDOSCOPY;  Service: Cardiovascular;  Laterality: N/A;   TEE WITHOUT CARDIOVERSION N/A 07/24/2017   Procedure: TRANSESOPHAGEAL ECHOCARDIOGRAM (TEE);  Surgeon: Lucas Dorise POUR, MD;  Location: [facility] OR;  Service: Open Heart Surgery;  Laterality: N/A;   TEE WITHOUT CARDIOVERSION N/A 08/01/2022   Procedure: TRANSESOPHAGEAL ECHOCARDIOGRAM;  Surgeon: Pietro Redell RAMAN, MD;  [facility]  INVASIVE CV LAB;  Service: Cardiovascular;  Laterality: N/A;   TOTAL KNEE ARTHROPLASTY Right 08/07/2019   Procedure: TOTAL KNEE ARTHROPLASTY;  Surgeon: Melodi Lerner, MD;  Location: [facility] ORS;  Service: Orthopedics;  Laterality: Right;   ULTRASOUND GUIDANCE FOR VASCULAR ACCESS  07/10/2017   Procedure: Ultrasound Guidance For Vascular Access;  Surgeon: Elmira Newman PARAS, MD;  Location: [facility] INVASIVE CV LAB;  Service: Cardiovascular;;   Patient Active Problem List   Diagnosis Date Noted   Presence of heart assist device (HCC) 03/29/2023   Leukopenia 09/13/2022   PICC (peripherally  inserted central catheter) in place 08/30/2022   Pure hypercholesterolemia 08/03/2022   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   Encounter for assessment of peripherally inserted central catheter (PICC) 08/02/2022   Aortic valve endocarditis 08/01/2022   Cardiomyopathy (HCC) 08/01/2022   Chronic HFrEF (heart failure with reduced ejection fraction) (HCC) 08/01/2022   Prosthetic cardiac valve vegetation 08/01/2022   Anemia 08/01/2022   Streptococcal bacteremia 07/29/2022   Aortic atherosclerosis 07/29/2022   Medication management 07/29/2022   Bacteremia 07/29/2022   Low back pain 06/24/2022   Constipation 06/24/2022   Renal cyst, 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   PAC (premature atrial contraction) 03/01/2021   PVC (premature ventricular contraction) 03/01/2021   Atrial bigeminy 02/28/2020   OA (osteoarthritis) of knee 08/07/2019   Primary osteoarthritis of right knee 08/07/2019   Pain in right knee 05/17/2019   Coronary artery disease involving coronary bypass graft of native heart without angina pectoris 09/19/2018   Weakness 08/01/2018   Mixed hyperlipidemia 06/06/2018   S/P left atrial appendage ligation 07/25/2017   S/P AVR 07/25/2017   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   Coronary artery disease without angina pectoris 07/12/2017   HFrEF (heart failure with reduced ejection fraction) (HCC) 07/07/2017   Mitral regurgitation 07/07/2017   (HFpEF) heart failure with preserved ejection fraction (HCC) 07/07/2017   Gout 02/12/2013   Benign hypertension 02/12/2013   GERD (gastroesophageal reflux disease) 02/12/2013    PCP: Leonarda Roxan BROCKS, NP  REFERRING PROVIDER: Gil Greig BRAVO, NP  REFERRING DIAG: R26.81 (ICD-10-CM) - Unstable gait R26.89 (ICD-10-CM) - Balance problem  THERAPY DIAG:  Difficulty in walking, not elsewhere classified  Unsteadiness on feet  Muscle weakness (generalized)  Cramp and spasm  Abnormal posture  Rationale for Evaluation and Treatment: Rehabilitation  ONSET DATE:  01/11/2024  SUBJECTIVE:   SUBJECTIVE STATEMENT: Patient reports he was able to play disc golf yesterday.  My friends are commenting on how much straighter my knees are and that my posture is much better.   He is still having some lateral knee pain on the right from aggressively stretching during piriformis stretch.  I still can't put my sock on by myself on that side  PERTINENT HISTORY: na PAIN:  03/01/24 Are you having pain? Yes: NPRS scale: 0/10 Pain location: left knee Pain description: aching Aggravating factors: downhill or down steps Relieving factors: rest   PRECAUTIONS: Fall  RED FLAGS: None   WEIGHT BEARING RESTRICTIONS: No  FALLS:  Has patient fallen in last 6 months? Yes. 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  DIAGNOSTIC FINDINGS: na  PATIENT SURVEYS:  LEFS  Extreme difficulty/unable (0), Quite a bit of difficulty (1), Moderate difficulty (2), Little difficulty (3), No difficulty (4) Survey date:  01/17/24  Any of your usual work, housework or school activities   2. Usual hobbies, recreational or sporting activities   3. Getting into/out of the bath   4. Walking between rooms   5. Putting on socks/shoes   6. Squatting    7. Lifting an object, like a bag of groceries from the floor   8. Performing  light activities around your home   9. Performing heavy activities around your home   10. Getting into/out of a car   11. Walking 2 blocks   12. Walking 1 mile   13. Going up/down 10 stairs (1 flight)   14. Standing for 1 hour   15.  sitting for 1 hour   16. Running on even ground   17. Running on uneven ground   18. Making sharp turns while running fast   19. Hopping    20. Rolling over in bed   Score total:  62/80     COGNITION: Overall cognitive status: Within functional limits for tasks assessed     SENSATION: WFL   MUSCLE LENGTH: Hamstrings: Right 45 deg; Left 40 deg Thomas test: Right pos; Left pos  POSTURE: rounded shoulders and forward head  PALPATION: Mild crepitus   LOWER EXTREMITY ROM:  Right knee flexion : approx 110 degrees,  extension approx -20 Left knee flexion: approx 100 degrees, extension approx -20  LOWER EXTREMITY MMT:   4/5 extension bilaterally   4/5 flexion bilaterally  LOWER EXTREMITY SPECIAL TESTS:  Knee special tests: Patellafemoral grind test: negative  FUNCTIONAL TESTS:  5 times sit to stand: 12.03 sec Timed up and go (TUG): 13.15  GAIT: Distance walked: 30 feet Assistive device utilized: None Level of assistance: Complete Independence Comments: slight deviation on turns but otherwise good heel to toe gait  TREATMENT DATE: 03/01/24 Nustep x 8 min level 7 (PT present to discuss status and progress) Standing hamstring stretch 3 x 30 sec each LE Standing quad/hip flexor stretch 3 x 30 sec Rocker board x 2 min Gastroc stretch 5 x 10 sec Soleus stretch 5 x 10 sec Leg press 100 lbs 2 x 10 Gastroc press 100 lbs 2 x 10 Fwd T 2 x 10 each LE with 5 lb kb Sit to stand with purple ball for adduction x 10 Squat to table with purple ball for adduction x 10 SLS doing around the worlds wit 5 lb kb with cga x 10 each LE  TREATMENT DATE: 02/28/24 Nustep x 8 min level 7 (PT present to discuss status and progress) Demonstrated melt balls  to lateral leg/distal IT band, as well as roller to mid IT band  Hip adduction isometric with purple ball x 20 Sit to stand with purple ball for adduction x 10 Squat to table with purple ball for adduction x 10 Lunges fwd x 10 each leg with cga Lunges lateral x 10 each with sba Fwd T x 10 each LE  Marching in place with opposite arm and leg using 2 lbs dumbbells Marching in place with cross body hand to knee x 20 Wing taps facing wall 2 x 10 Reach and pull downs facing  wall 2 x 10 Cone touches 3 x 10 each LE (had to raise cone several times, ended up on counter top)  TREATMENT DATE: 02/23/24 Nustep x 5 min level 6 (PT present to discuss status and progress) Single leg dead lift with 5 lbs 2 x 10 each leg (standing T with opposing UE support) Marching in place with opposite arm and leg using 2 lbs dumbbells Wing taps facing wall 2 x 10 Reach and pull downs facing wall 2 x 10 Cone touches 3 x 10 each LE (had to raise cone several times, ended up on counter top) Squat to chair x 10 immediately followed by pulse squat x 15 sec, then squat hold x 15 sec  TREATMENT DATE: 01/17/24 Initial eval completed and initial HEP established Educated on fall prevention and fall risk   PATIENT EDUCATION:  Education details: Discussed appropriate level of exercise and reason for falls, fall prevention Person educated: Patient Education method: Programmer, Multimedia, Demonstration, Verbal cues, and Handouts Education comprehension: verbalized understanding, returned demonstration, and verbal cues required  HOME EXERCISE PROGRAM: Access Code: [email or web address] URL: [email or web address] Date: 02/14/2024 Prepared by: Delon Haddock  Exercises - Standing Hamstring Stretch on Chair  - 1 x daily - 7 x weekly - 1 sets - 3 reps - 30 sec hold - Quadricep Stretch with Chair and Counter Support  - 1 x daily - 7 x weekly - 1 sets - 3 reps - 30 sec hold - Seated Figure 4 Piriformis Stretch  - 1 x daily - 7 x  weekly - 1 sets - 3 reps - 30 sed hold - Sidelying Open Book Thoracic Lumbar Rotation and Extension  - 1 x daily - 7 x weekly - 1 sets - 10 reps - 5 sec hold - Single Leg Cone Touch  - 1 x daily - 7 x weekly - 3 sets - 10 reps - Side Stepping with Resistance at Ankles  - 1 x daily - 7 x weekly - 3 sets - 10 reps - Reverse Lunge With Slider at Counter  - 1 x daily - 7 x weekly - 3 sets - 10 reps - Lateral Lunge With Slider  - 1 x daily - 7 x weekly - 3 sets - 10 reps  ASSESSMENT:  CLINICAL IMPRESSION: [place] is progressing appropriately.  He is very conscious of his posture.  He is doing disc golf without any assistive devices now.  He would benefit from continuing skilled PT for quad rehab, balance training, gait training and fall prevention.    OBJECTIVE IMPAIRMENTS: Abnormal gait, decreased balance, difficulty walking, decreased strength, hypomobility, increased fascial restrictions, increased muscle spasms, impaired flexibility, improper body mechanics, postural dysfunction, and pain.   ACTIVITY LIMITATIONS: carrying, lifting, standing, squatting, sleeping, stairs, transfers, bathing, toileting, dressing, hygiene/grooming, and caring for others  PARTICIPATION LIMITATIONS: meal prep, cleaning, laundry, interpersonal relationship, driving, shopping, and yard work  PERSONAL FACTORS: Age, Fitness, Past/current experiences, and 1-2 comorbidities: pacemaker Htn, CHF are also affecting patient's functional outcome.   REHAB POTENTIAL: Good  CLINICAL DECISION MAKING: Stable/uncomplicated  EVALUATION COMPLEXITY: Low   GOALS: Goals reviewed with patient? Yes  SHORT TERM GOALS: Target date: 02/14/2024  Pain report to be no greater than 4/10  Baseline: Goal status: Met 01/19/24  2.  Patient will be independent with initial HEP  Baseline:  Goal status: MET 01/30/24   LONG TERM GOALS: Target date: 03/13/2024  Patient to report pain no greater than 2/10  Baseline:  Goal status: MET  01/30/24  2.  Patient to be independent with advanced HEP  Baseline:  Goal status: MET 02/16/24  3.  BERG to improve by 2-3 points Baseline:  Goal status: In progress  4.  Functional tests to improve by 2-3 sec Baseline:  Goal status: In progress  5.  No falls during PT episode Baseline: had one fall due to knee instability while doing a putting maneuver at disc golf on 02/15/23 Goal status: In progress  6.  Patient to be able to ascend and descend steps without pain or no greater than 2/10  Baseline:  Goal status: In progress   PLAN:  PT FREQUENCY: 2x/week  PT DURATION: 8 weeks  PLANNED INTERVENTIONS: 97110-Therapeutic exercises, 97530- Therapeutic activity, [id number]- Neuromuscular re-education, [id number]- Self Care, [id number]- Manual therapy, [phone]- Gait training, [phone]- Canalith repositioning, [id number]- Aquatic Therapy, [phone]- Electrical stimulation (unattended), [phone]- Electrical stimulation (manual), [id number]- Vasopneumatic device, [id number]- Ultrasound, [id number]- Traction (mechanical), [id number]- Ionotophoresis 4mg /ml Dexamethasone , [id number] (1-2 muscles), [id number] (3+ muscles)- Dry Needling, Patient/Family education, Balance training, Stair training, Taping, Joint mobilization, Spinal mobilization, Vestibular training, DME instructions, Cryotherapy, and Moist heat  PLAN FOR NEXT SESSION: Nustep, continue agility skills, slider work for balance, progress carioca and hurdles, progress quad rehab and balance training   Kessa Fairbairn B. Annie Roseboom, PT 03/01/24 11:06 AM [facility] [address] [place], [place] [address] Phone # [phone] Fax [phone]  "

## 2024-03-06 ENCOUNTER — Other Ambulatory Visit: Payer: Self-pay | Admitting: Physician Assistant

## 2024-03-06 ENCOUNTER — Ambulatory Visit

## 2024-03-06 DIAGNOSIS — T826XXD Infection and inflammatory reaction due to cardiac valve prosthesis, subsequent encounter: Secondary | ICD-10-CM

## 2024-03-06 DIAGNOSIS — I2510 Atherosclerotic heart disease of native coronary artery without angina pectoris: Secondary | ICD-10-CM

## 2024-03-06 DIAGNOSIS — I25118 Atherosclerotic heart disease of native coronary artery with other forms of angina pectoris: Secondary | ICD-10-CM

## 2024-03-06 DIAGNOSIS — I491 Atrial premature depolarization: Secondary | ICD-10-CM

## 2024-03-06 DIAGNOSIS — M6281 Muscle weakness (generalized): Secondary | ICD-10-CM

## 2024-03-06 DIAGNOSIS — I5020 Unspecified systolic (congestive) heart failure: Secondary | ICD-10-CM

## 2024-03-06 DIAGNOSIS — R262 Difficulty in walking, not elsewhere classified: Secondary | ICD-10-CM

## 2024-03-06 DIAGNOSIS — I493 Ventricular premature depolarization: Secondary | ICD-10-CM

## 2024-03-06 DIAGNOSIS — R2681 Unsteadiness on feet: Secondary | ICD-10-CM

## 2024-03-06 DIAGNOSIS — R252 Cramp and spasm: Secondary | ICD-10-CM

## 2024-03-06 DIAGNOSIS — R293 Abnormal posture: Secondary | ICD-10-CM

## 2024-03-06 NOTE — Therapy (Signed)
 " OUTPATIENT PHYSICAL THERAPY LOWER EXTREMITY TREATMENT   Patient Name: [name] MRN: [id number] DOB:Mar 31, 1940, 84 y.o., male Today's Date: 03/06/2024  END OF SESSION:  PT End of Session - 03/06/24 1101     Visit Number 12    Number of Visits 16    Date for Recertification  03/13/24    Authorization Type [employer]    Authorization Time Period 01/17/24 through 04/16/24    Authorization - Visit Number 12    Authorization - Number of Visits 16    Progress Note Due on Visit 20    PT Start Time 1058    PT Stop Time 1145    PT Time Calculation (min) 47 min    Activity Tolerance Patient tolerated treatment well    Behavior During Therapy WFL for tasks assessed/performed           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   CHF (congestive heart failure) (HCC)    Elevated cholesterol    Gout    Hepatitis 1964   hepatitis A    HFrEF (heart failure with reduced ejection fraction) (HCC)    Hypertension    Moderate mitral regurgitation    Pre-diabetes    Severe aortic stenosis    Past Surgical History:  Procedure Laterality Date   AORTIC VALVE REPLACEMENT N/A 07/24/2017   Procedure: AORTIC VALVE REPLACEMENT (AVR) using 23mm Inspiris Aortic Valve;  Surgeon: Lucas Dorise POUR, MD;  Location: [facility] OR;  Service: Open Heart Surgery;  Laterality: N/A;   CLIPPING OF ATRIAL APPENDAGE N/A 07/24/2017   Procedure: CLIPPING OF ATRIAL APPENDAGE using a 50mm Atricure clip;  Surgeon: Lucas Dorise POUR, MD;  Location: [facility] OR;  Service: Open Heart Surgery;  Laterality: N/A;   CORONARY ARTERY BYPASS GRAFT N/A 07/24/2017   Procedure: CORONARY ARTERY BYPASS GRAFTING (CABG) times 3 using the left greater saphenous vein harvested endoscopically and left internal mammary artery.;  Surgeon: Lucas Dorise POUR, MD;  Location: [facility] OR;  Service: Open Heart Surgery;  Laterality: N/A;   EYE SURGERY Bilateral    lens replacements for cataracts    HERNIA REPAIR     INGUINAL HERNIA REPAIR Right 05/06/2015   Procedure: LAPAROSCOPIC RIGHT INGUINAL HERNIA WITH MESH;  Surgeon: Vicenta Poli, MD;  Location: [facility] ORS;  Service: General;  Laterality: Right;   INSERTION OF MESH Right 05/06/2015   Procedure: INSERTION OF MESH;  Surgeon: Vicenta Poli, MD;  Location: [facility] ORS;  Service: General;  Laterality: Right;   IR BONE MARROW BIOPSY & ASPIRATION  09/15/2022   RIGHT HEART CATH N/A 03/21/2023   Procedure: RIGHT HEART CATH;  Surgeon: Elmira Newman PARAS, MD;  Location: [facility] INVASIVE CV LAB;  Service: Cardiovascular;  Laterality: N/A;   RIGHT/LEFT HEART CATH AND CORONARY ANGIOGRAPHY N/A 07/10/2017   Procedure: RIGHT/LEFT HEART CATH AND CORONARY ANGIOGRAPHY;  Surgeon: Elmira Newman PARAS, MD;  Location: [facility] INVASIVE CV LAB;  Service: Cardiovascular;  Laterality: N/A;   TEE WITHOUT CARDIOVERSION N/A 07/11/2017   Procedure: TRANSESOPHAGEAL ECHOCARDIOGRAM (TEE);  Surgeon: Elmira Newman PARAS, MD;  Location: [facility] ENDOSCOPY;  Service: Cardiovascular;  Laterality: N/A;   TEE WITHOUT CARDIOVERSION N/A 07/24/2017   Procedure: TRANSESOPHAGEAL ECHOCARDIOGRAM (TEE);  Surgeon: Lucas Dorise POUR, MD;  Location: [facility] OR;  Service: Open Heart Surgery;  Laterality: N/A;   TEE WITHOUT CARDIOVERSION N/A 08/01/2022   Procedure: TRANSESOPHAGEAL ECHOCARDIOGRAM;  Surgeon: Pietro Redell RAMAN, MD;  Location: [facility]  INVASIVE CV LAB;  Service: Cardiovascular;  Laterality: N/A;   TOTAL KNEE ARTHROPLASTY Right 08/07/2019   Procedure: TOTAL KNEE ARTHROPLASTY;  Surgeon: Melodi Lerner, MD;  Location: [facility] ORS;  Service: Orthopedics;  Laterality: Right;   ULTRASOUND GUIDANCE FOR VASCULAR ACCESS  07/10/2017   Procedure: Ultrasound Guidance For Vascular Access;  Surgeon: Elmira Newman PARAS, MD;  Location: [facility] INVASIVE CV LAB;  Service: Cardiovascular;;   Patient Active Problem List   Diagnosis Date Noted   Presence of heart assist device (HCC) 03/29/2023   Leukopenia 09/13/2022   PICC (peripherally  inserted central catheter) in place 08/30/2022   Pure hypercholesterolemia 08/03/2022   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   Encounter for assessment of peripherally inserted central catheter (PICC) 08/02/2022   Aortic valve endocarditis 08/01/2022   Cardiomyopathy (HCC) 08/01/2022   Chronic HFrEF (heart failure with reduced ejection fraction) (HCC) 08/01/2022   Prosthetic cardiac valve vegetation 08/01/2022   Anemia 08/01/2022   Streptococcal bacteremia 07/29/2022   Aortic atherosclerosis 07/29/2022   Medication management 07/29/2022   Bacteremia 07/29/2022   Low back pain 06/24/2022   Constipation 06/24/2022   Renal cyst, 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   PAC (premature atrial contraction) 03/01/2021   PVC (premature ventricular contraction) 03/01/2021   Atrial bigeminy 02/28/2020   OA (osteoarthritis) of knee 08/07/2019   Primary osteoarthritis of right knee 08/07/2019   Pain in right knee 05/17/2019   Coronary artery disease involving coronary bypass graft of native heart without angina pectoris 09/19/2018   Weakness 08/01/2018   Mixed hyperlipidemia 06/06/2018   S/P left atrial appendage ligation 07/25/2017   S/P AVR 07/25/2017   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   Coronary artery disease without angina pectoris 07/12/2017   HFrEF (heart failure with reduced ejection fraction) (HCC) 07/07/2017   Mitral regurgitation 07/07/2017   (HFpEF) heart failure with preserved ejection fraction (HCC) 07/07/2017   Gout 02/12/2013   Benign hypertension 02/12/2013   GERD (gastroesophageal reflux disease) 02/12/2013    PCP: Leonarda Roxan BROCKS, NP  REFERRING PROVIDER: Gil Greig BRAVO, NP  REFERRING DIAG: R26.81 (ICD-10-CM) - Unstable gait R26.89 (ICD-10-CM) - Balance problem  THERAPY DIAG:  Difficulty in walking, not elsewhere classified  Unsteadiness on feet  Muscle weakness (generalized)  Cramp and spasm  Abnormal posture  Rationale for Evaluation and Treatment: Rehabilitation  ONSET DATE:  01/11/2024  SUBJECTIVE:   SUBJECTIVE STATEMENT: Patient reports he continues to do well.  Working on his balance strategies.    PERTINENT HISTORY: na PAIN:  03/06/24 Are you having pain? Yes: NPRS scale: 0/10 Pain location: left knee Pain description: aching Aggravating factors: downhill or down steps Relieving factors: rest   PRECAUTIONS: Fall  RED FLAGS: None   WEIGHT BEARING RESTRICTIONS: No  FALLS:  Has patient fallen in last 6 months? Yes. 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  DIAGNOSTIC FINDINGS: na  PATIENT SURVEYS:  LEFS  Extreme difficulty/unable (0), Quite a bit  of difficulty (1), Moderate difficulty (2), Little difficulty (3), No difficulty (4) Survey date:  01/17/24  Any of your usual work, housework or school activities   2. Usual hobbies, recreational or sporting activities   3. Getting into/out of the bath   4. Walking between rooms   5. Putting on socks/shoes   6. Squatting    7. Lifting an object, like a bag of groceries from the floor   8. Performing light activities around your home   9. Performing heavy activities around your home   10. Getting into/out of a car   11. Walking 2 blocks   12. Walking 1 mile   13. Going up/down 10 stairs (1 flight)   14. Standing for 1 hour   15.  sitting  for 1 hour   16. Running on even ground   17. Running on uneven ground   18. Making sharp turns while running fast   19. Hopping    20. Rolling over in bed   Score total:  62/80     COGNITION: Overall cognitive status: Within functional limits for tasks assessed     SENSATION: WFL   MUSCLE LENGTH: Hamstrings: Right 45 deg; Left 40 deg Thomas test: Right pos; Left pos  POSTURE: rounded shoulders and forward head  PALPATION: Mild crepitus   LOWER EXTREMITY ROM:  Right knee flexion : approx 110 degrees,  extension approx -20 Left knee flexion: approx 100 degrees, extension approx -20  LOWER EXTREMITY MMT:   4/5 extension bilaterally   4/5 flexion bilaterally  LOWER EXTREMITY SPECIAL TESTS:  Knee special tests: Patellafemoral grind test: negative  FUNCTIONAL TESTS:  5 times sit to stand: 12.03 sec Timed up and go (TUG): 13.15  GAIT: Distance walked: 30 feet Assistive device utilized: None Level of assistance: Complete Independence Comments: slight deviation on turns but otherwise good heel to toe gait  TREATMENT DATE: 03/06/24 Nustep x 8 min level 7 (PT present to discuss status and progress) Rear lunge to pull through high knee 2 x 10 each LE with encouraging further hip extension on the rear lunge on second set Rocker board x 2 min  Lunge to BOSU x 10 each LE fwd and x 10 each LE lateral SLS doing around the [place] wit 5 lb kb with cga x 10 each LE both directions Fwd T with 5 lb kb x 10 each LE Sit to stand with purple ball for adduction x 10 with 5 lb kb Squat to table with purple ball for adduction x 10 with 5 lb kb Step over and back using Teclor bars for increased height: fwd x 10 each LE then lateral x 10 each Cone touches 3 x 10 each LE  TREATMENT DATE: 03/01/24 Nustep x 8 min level 7 (PT present to discuss status and progress) Standing hamstring stretch 3 x 30 sec each LE Standing quad/hip flexor stretch 3 x 30 sec Rocker board x 2 min Gastroc  stretch 5 x 10 sec Soleus stretch 5 x 10 sec Leg press 100 lbs 2 x 10 Gastroc press 100 lbs 2 x 10 Fwd T 2 x 10 each LE with 5 lb kb Sit to stand with purple ball for adduction x 10 Squat to table with purple ball for adduction x 10 SLS doing around the [place] wit 5 lb kb with cga x 10 each LE  TREATMENT DATE: 02/28/24 Nustep x 8 min level 7 (PT present to discuss status and progress) Demonstrated melt balls to lateral  leg/distal IT band, as well as roller to mid IT band  Hip adduction isometric with purple ball x 20 Sit to stand with purple ball for adduction x 10 Squat to table with purple ball for adduction x 10 Lunges fwd x 10 each leg with cga Lunges lateral x 10 each with sba Fwd T x 10 each LE  Marching in place with opposite arm and leg using 2 lbs dumbbells Marching in place with cross body hand to knee x 20 Wing taps facing wall 2 x 10 Reach and pull downs facing wall 2 x 10 Cone touches 3 x 10 each LE (had to raise cone several times, ended up on counter top)  TREATMENT DATE: 01/17/24 Initial eval completed and initial HEP established Educated on fall prevention and fall risk   PATIENT EDUCATION:  Education details: Discussed appropriate level of exercise and reason for falls, fall prevention Person educated: Patient Education method: Programmer, Multimedia, Demonstration, Verbal cues, and Handouts Education comprehension: verbalized understanding, returned demonstration, and verbal cues required  HOME EXERCISE PROGRAM: Access Code: [email or web address] URL: [email or web address] Date: 02/14/2024 Prepared by: Delon Haddock  Exercises - Standing Hamstring Stretch on Chair  - 1 x daily - 7 x weekly - 1 sets - 3 reps - 30 sec hold - Quadricep Stretch with Chair and Counter Support  - 1 x daily - 7 x weekly - 1 sets - 3 reps - 30 sec hold - Seated Figure 4 Piriformis Stretch  - 1 x daily - 7 x weekly - 1 sets - 3 reps - 30 sed hold - Sidelying Open Book Thoracic Lumbar  Rotation and Extension  - 1 x daily - 7 x weekly - 1 sets - 10 reps - 5 sec hold - Single Leg Cone Touch  - 1 x daily - 7 x weekly - 3 sets - 10 reps - Side Stepping with Resistance at Ankles  - 1 x daily - 7 x weekly - 3 sets - 10 reps - Reverse Lunge With Slider at Counter  - 1 x daily - 7 x weekly - 3 sets - 10 reps - Lateral Lunge With Slider  - 1 x daily - 7 x weekly - 3 sets - 10 reps  ASSESSMENT:  CLINICAL IMPRESSION: [place] continues to progress appropriately.  He no longer needs contact guard assist on dynamic balance task as he demonstrates good protective responses.  He does require stand by assist for the single leg dynamic tasks. He remains well motivated and compliant.   He would benefit from continuing skilled PT for quad rehab, balance training, gait training and fall prevention.    OBJECTIVE IMPAIRMENTS: Abnormal gait, decreased balance, difficulty walking, decreased strength, hypomobility, increased fascial restrictions, increased muscle spasms, impaired flexibility, improper body mechanics, postural dysfunction, and pain.   ACTIVITY LIMITATIONS: carrying, lifting, standing, squatting, sleeping, stairs, transfers, bathing, toileting, dressing, hygiene/grooming, and caring for others  PARTICIPATION LIMITATIONS: meal prep, cleaning, laundry, interpersonal relationship, driving, shopping, and yard work  PERSONAL FACTORS: Age, Fitness, Past/current experiences, and 1-2 comorbidities: pacemaker Htn, CHF are also affecting patient's functional outcome.   REHAB POTENTIAL: Good  CLINICAL DECISION MAKING: Stable/uncomplicated  EVALUATION COMPLEXITY: Low   GOALS: Goals reviewed with patient? Yes  SHORT TERM GOALS: Target date: 02/14/2024  Pain report to be no greater than 4/10  Baseline: Goal status: Met 01/19/24  2.  Patient will be independent with initial HEP  Baseline:  Goal status: MET 01/30/24   LONG TERM GOALS: Target date: 03/13/2024  Patient to report pain no  greater than 2/10  Baseline:  Goal status: MET 01/30/24  2.  Patient to be independent with advanced HEP  Baseline:  Goal status: MET 02/16/24  3.  BERG to improve by 2-3 points Baseline:  Goal status: In progress  4.  Functional tests to improve by 2-3 sec Baseline:  Goal status: In progress  5.  No falls during PT episode Baseline: had one fall due to knee instability while doing a putting maneuver at disc golf on 02/15/23 Goal status: In progress  6.  Patient to be able to ascend and descend steps without pain or no greater than 2/10  Baseline:  Goal status: In progress   PLAN:  PT FREQUENCY: 2x/week  PT DURATION: 8 weeks  PLANNED INTERVENTIONS: 97110-Therapeutic exercises, 97530- Therapeutic activity, [id number]- Neuromuscular re-education, [phone]- Self Care, [id number]- Manual therapy, [phone]- Gait training, [phone]- Canalith repositioning, [phone]- Aquatic Therapy, [phone]- Electrical stimulation (unattended), [phone]- Electrical stimulation (manual), [id number]- Vasopneumatic device, [id number]- Ultrasound, [id number]- Traction (mechanical), [id number]- Ionotophoresis 4mg /ml Dexamethasone , [id number] (1-2 muscles), [id number] (3+ muscles)- Dry Needling, Patient/Family education, Balance training, Stair training, Taping, Joint mobilization, Spinal mobilization, Vestibular training, DME instructions, Cryotherapy, and Moist heat  PLAN FOR NEXT SESSION: Nustep, continue agility skills, slider work and increasingly higher level balance tasks for sing proper balance strategies, progress carioca and hurdles, progress quad rehab and balance training   Myrick Mcnairy B. Treg Diemer, PT 03/06/24 3:00 PM [facility] [address] [place], [place] [address] Phone # [phone] Fax [phone]  "

## 2024-03-07 ENCOUNTER — Ambulatory Visit (HOSPITAL_COMMUNITY)
Admission: RE | Admit: 2024-03-07 | Discharge: 2024-03-07 | Disposition: A | Discharge status: Home / Self Care | Source: Ambulatory Visit | Attending: Physician Assistant | Admitting: Physician Assistant

## 2024-03-07 DIAGNOSIS — I493 Ventricular premature depolarization: Secondary | ICD-10-CM | POA: Insufficient documentation

## 2024-03-07 DIAGNOSIS — I25118 Atherosclerotic heart disease of native coronary artery with other forms of angina pectoris: Secondary | ICD-10-CM | POA: Insufficient documentation

## 2024-03-07 DIAGNOSIS — I330 Acute and subacute infective endocarditis: Secondary | ICD-10-CM | POA: Diagnosis present

## 2024-03-07 DIAGNOSIS — T826XXD Infection and inflammatory reaction due to cardiac valve prosthesis, subsequent encounter: Secondary | ICD-10-CM | POA: Diagnosis present

## 2024-03-07 DIAGNOSIS — I5020 Unspecified systolic (congestive) heart failure: Secondary | ICD-10-CM | POA: Diagnosis present

## 2024-03-07 DIAGNOSIS — I491 Atrial premature depolarization: Secondary | ICD-10-CM | POA: Insufficient documentation

## 2024-03-07 LAB — MYOCARDIAL PERFUSION IMAGING
Angina Index: 0
Base ST Depression (mm): 0 mm
Estimated workload: 7
Exercise duration (min): 7 min
LV dias vol: 170 mL (ref 62–150)
LV sys vol: 64 mL
MPHR: 138 {beats}/min
Nuc Stress EF: 62 %
Peak HR: 118 {beats}/min
Percent HR: 86 %
RPE: 19
Rest HR: 56 {beats}/min
Rest Nuclear Isotope Dose: 10.1 mCi
SDS: 2
SRS: 5
SSS: 4
Stress Nuclear Isotope Dose: 32 mCi
TID: 0.78

## 2024-03-07 MED ORDER — TECHNETIUM TC 99M TETROFOSMIN IV KIT
32.0000 | PACK | Freq: Once | INTRAVENOUS | Status: AC | PRN
  Administered 2024-03-07: 32 via INTRAVENOUS

## 2024-03-07 MED ORDER — TECHNETIUM TC 99M TETROFOSMIN IV KIT
10.1000 | PACK | Freq: Once | INTRAVENOUS | Status: AC | PRN
  Administered 2024-03-07: 10.1 via INTRAVENOUS

## 2024-03-08 ENCOUNTER — Ambulatory Visit

## 2024-03-08 DIAGNOSIS — M6281 Muscle weakness (generalized): Secondary | ICD-10-CM

## 2024-03-08 DIAGNOSIS — R252 Cramp and spasm: Secondary | ICD-10-CM

## 2024-03-08 DIAGNOSIS — R2681 Unsteadiness on feet: Secondary | ICD-10-CM

## 2024-03-08 DIAGNOSIS — R262 Difficulty in walking, not elsewhere classified: Secondary | ICD-10-CM

## 2024-03-08 DIAGNOSIS — R293 Abnormal posture: Secondary | ICD-10-CM

## 2024-03-08 NOTE — Therapy (Unsigned)
 " OUTPATIENT PHYSICAL THERAPY LOWER EXTREMITY TREATMENT   Patient Name: [name] MRN: [id number] DOB:[birth year], 84 y.o., male Today's Date: 03/08/2024  END OF SESSION:  PT End of Session - 03/08/24 1022     Visit Number 13    Number of Visits 16    Date for Recertification  04/16/24    Authorization Type [employer] (recert completed on 03/08/24)    Authorization Time Period 01/17/24 through 04/16/24    Authorization - Visit Number 13    Authorization - Number of Visits 16    Progress Note Due on Visit 20    PT Start Time 1010    PT Stop Time 1055    PT Time Calculation (min) 45 min    Activity Tolerance Patient tolerated treatment well    Behavior During Therapy WFL for tasks assessed/performed           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   CHF (congestive heart failure) (HCC)    Elevated cholesterol    Gout    Hepatitis 1964   hepatitis A    HFrEF (heart failure with reduced ejection fraction) (HCC)    Hypertension    Moderate mitral regurgitation    Pre-diabetes    Severe aortic stenosis    Past Surgical History:  Procedure Laterality Date   AORTIC VALVE REPLACEMENT N/A 07/24/2017   Procedure: AORTIC VALVE REPLACEMENT (AVR) using 23mm Inspiris Aortic Valve;  Surgeon: Lucas Dorise POUR, MD;  Location: [facility] OR;  Service: Open Heart Surgery;  Laterality: N/A;   CLIPPING OF ATRIAL APPENDAGE N/A 07/24/2017   Procedure: CLIPPING OF ATRIAL APPENDAGE using a 50mm Atricure clip;  Surgeon: Lucas Dorise POUR, MD;  Location: [facility] OR;  Service: Open Heart Surgery;  Laterality: N/A;   CORONARY ARTERY BYPASS GRAFT N/A 07/24/2017   Procedure: CORONARY ARTERY BYPASS GRAFTING (CABG) times 3 using the left greater saphenous vein harvested endoscopically and left internal mammary artery.;  Surgeon: Lucas Dorise POUR, MD;  Location: [facility] OR;  Service: Open Heart Surgery;  Laterality: N/A;   EYE SURGERY Bilateral    lens  replacements for cataracts   HERNIA REPAIR     INGUINAL HERNIA REPAIR Right 05/06/2015   Procedure: LAPAROSCOPIC RIGHT INGUINAL HERNIA WITH MESH;  Surgeon: Vicenta Poli, MD;  Location: [facility] ORS;  Service: General;  Laterality: Right;   INSERTION OF MESH Right 05/06/2015   Procedure: INSERTION OF MESH;  Surgeon: Vicenta Poli, MD;  Location: [facility] ORS;  Service: General;  Laterality: Right;   IR BONE MARROW BIOPSY & ASPIRATION  09/15/2022   RIGHT HEART CATH N/A 03/21/2023   Procedure: RIGHT HEART CATH;  Surgeon: Elmira Newman PARAS, MD;  Location: [facility] INVASIVE CV LAB;  Service: Cardiovascular;  Laterality: N/A;   RIGHT/LEFT HEART CATH AND CORONARY ANGIOGRAPHY N/A 07/10/2017   Procedure: RIGHT/LEFT HEART CATH AND CORONARY ANGIOGRAPHY;  Surgeon: Elmira Newman PARAS, MD;  Location: [facility];  Service: Cardiovascular;  Laterality: N/A;   TEE WITHOUT CARDIOVERSION N/A 07/11/2017   Procedure: TRANSESOPHAGEAL ECHOCARDIOGRAM (TEE);  Surgeon: Elmira Newman PARAS, MD;  Location: [facility] ENDOSCOPY;  Service: Cardiovascular;  Laterality: N/A;   TEE WITHOUT CARDIOVERSION N/A 07/24/2017   Procedure: TRANSESOPHAGEAL ECHOCARDIOGRAM (TEE);  Surgeon: Lucas Dorise POUR, MD;  Location: [facility] OR;  Service: Open Heart Surgery;  Laterality: N/A;   TEE WITHOUT CARDIOVERSION N/A 08/01/2022   Procedure: TRANSESOPHAGEAL ECHOCARDIOGRAM;  Surgeon: Pietro Redell RAMAN,  MD;  Location: [facility] CV LAB;  Service: Cardiovascular;  Laterality: N/A;   TOTAL KNEE ARTHROPLASTY Right 08/07/2019   Procedure: TOTAL KNEE ARTHROPLASTY;  Surgeon: Melodi Lerner, MD;  Location: [facility] ORS;  Service: Orthopedics;  Laterality: Right;   ULTRASOUND GUIDANCE FOR VASCULAR ACCESS  07/10/2017   Procedure: Ultrasound Guidance For Vascular Access;  Surgeon: Elmira Newman PARAS, MD;  Location: [facility] INVAS[facility] CV LAB;  Service: Cardiovascular;;   Patient Active Problem List   Diagnosis Date Noted   Presence of heart assist device (HCC) 03/29/2023   Leukopenia  09/13/2022   PICC (peripherally inserted central catheter) in place 08/30/2022   Pure hypercholesterolemia 08/03/2022   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   Encounter for assessment of peripherally inserted central catheter (PICC) 08/02/2022   Aortic valve endocarditis 08/01/2022   Cardiomyopathy (HCC) 08/01/2022   Chronic HFrEF (heart failure with reduced ejection fraction) (HCC) 08/01/2022   Prosthetic cardiac valve vegetation 08/01/2022   Anemia 08/01/2022   Streptococcal bacteremia 07/29/2022   Aortic atherosclerosis 07/29/2022   Medication management 07/29/2022   Bacteremia 07/29/2022   Low back pain 06/24/2022   Constipation 06/24/2022   Renal cyst, 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   PAC (premature atrial contraction) 03/01/2021   PVC (premature ventricular contraction) 03/01/2021   Atrial bigeminy 02/28/2020   OA (osteoarthritis) of knee 08/07/2019   Primary osteoarthritis of right knee 08/07/2019   Pain in right knee 05/17/2019   Coronary artery disease involving coronary bypass graft of native heart without angina pectoris 09/19/2018   Weakness 08/01/2018   Mixed hyperlipidemia 06/06/2018   S/P left atrial appendage ligation 07/25/2017   S/P AVR 07/25/2017   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   Coronary artery disease without angina pectoris 07/12/2017   HFrEF (heart failure with reduced ejection fraction) (HCC) 07/07/2017   Mitral regurgitation 07/07/2017   (HFpEF) heart failure with preserved ejection fraction (HCC) 07/07/2017   Gout 02/12/2013   Benign hypertension 02/12/2013   GERD (gastroesophageal reflux disease) 02/12/2013    PCP: Leonarda Roxan BROCKS, NP  REFERRING PROVIDER: Gil Greig BRAVO, NP  REFERRING DIAG: R26.81 (ICD-10-CM) - Unstable gait R26.89 (ICD-10-CM) - Balance problem  THERAPY DIAG:  Difficulty in walking, not elsewhere classified  Unsteadiness on feet  Muscle weakness (generalized)  Cramp and spasm  Abnormal posture  Rationale for Evaluation and Treatment:  Rehabilitation  ONSET DATE: 01/11/2024  SUBJECTIVE:   SUBJECTIVE STATEMENT: Patient reports he is getting better at his static balance tasks.      PERTINENT HISTORY: na PAIN:  03/08/24 Are you having pain? Yes: NPRS scale: 0/10 Pain location: left knee Pain description: aching Aggravating factors: downhill or down steps Relieving factors: rest   PRECAUTIONS: Fall  RED FLAGS: None   WEIGHT BEARING RESTRICTIONS: No  FALLS:  Has patient fallen in last 6 months? Yes. 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  DIAGNOSTIC FINDINGS: na  PATIENT SURVEYS:  LEFS  Extreme difficulty/unable (  0), Quite a bit of difficulty (1), Moderate difficulty (2), Little difficulty (3), No difficulty (4) Survey date:  01/17/24 03/08/24  Any of your usual work, housework or school activities    2. Usual hobbies, recreational or sporting activities    3. Getting into/out of the bath    4. Walking between rooms    5. Putting on socks/shoes    6. Squatting     7. Lifting an object, like a bag of groceries from the floor    8. Performing light activities around your home    9. Performing heavy activities around your home    10. Getting into/out of a car    11. Walking 2 blocks    12. Walking 1 mile    13. Going up/down 10 stairs (1 flight)     14. Standing for 1 hour    15.  sitting for 1 hour    16. Running on even ground    17. Running on uneven ground    18. Making sharp turns while running fast    19. Hopping     20. Rolling over in bed    Score total:  62/80 59/80     COGNITION: Overall cognitive status: Within functional limits for tasks assessed     SENSATION: WFL   MUSCLE LENGTH: Hamstrings: Right 45 deg; Left 40 deg Thomas test: Right pos; Left pos  POSTURE: rounded shoulders and forward head  PALPATION: Mild crepitus   LOWER EXTREMITY ROM:   Initial eval  Right knee flexion : approx 110 degrees,  extension approx -20 Left knee flexion: approx 100 degrees, extension approx -20  03/08/24: Right knee flexion : approx 122 degrees,  extension approx -18 Left knee flexion: approx 120 degrees, extension approx -18   LOWER EXTREMITY MMT:   4/5 extension bilaterally   4/5 flexion bilaterally  LOWER EXTREMITY SPECIAL TESTS:  Knee special tests: Patellafemoral grind test: negative  FUNCTIONAL TESTS:  Initial eval: 5 times sit to stand: 12.03 sec Timed up and go (TUG): 13.15   03/08/24: 5 times sit to stand:  9.0 sec Timed up and go (TUG): 7.56 sec GAIT: Distance walked: 30 feet Assistive device utilized: None Level of assistance: Complete Independence Comments: slight deviation on turns but otherwise good heel to toe gait  TREATMENT DATE: 03/08/24 Nustep x 8 min level 7 (PT present to discuss status and progress) Standing hamstring stretch 3 x 30 sec each LE Standing quad/hip flexor stretch 3 x 30 sec Bilateral heel raises at barre 2 x 10 Rear lunge to pull through high knee 2 x 10 each LE with encouraging further hip extension on the rear lunge on second set Rocker board x 2 min  Recert assessment completed Hip matrix : hip extension and  Lunge to BOSU x 10 each LE fwd and x 10 each LE lateral SLS doing around [place] wit 5 lb kb with cga x 10 each LE both directions Fwd T with 5 lb  kb x 10 each LE Sit to stand with purple ball for adduction x 10 with 5 lb kb Squat to table with purple ball for adduction x 10 with 5 lb kb Step over and back using Teclor bars for increased height: fwd x 10 each LE then lateral x 10 each Cone touches 3 x 10 each LE  TREATMENT DATE: 03/06/24 Nustep x 8 min level 7 (PT present to discuss status and progress) Rear lunge to pull through high knee 2 x  10 each LE with encouraging further hip extension on the rear lunge on second set Rocker board x 2 min  Lunge to BOSU x 10 each LE fwd and x 10 each LE lateral SLS doing around [place] wit 5 lb kb with cga x 10 each LE both directions Fwd T with 5 lb kb x 10 each LE Sit to stand with purple ball for adduction x 10 with 5 lb kb Squat to table with purple ball for adduction x 10 with 5 lb kb Step over and back using Teclor bars for increased height: fwd x 10 each LE then lateral x 10 each Cone touches 3 x 10 each LE  TREATMENT DATE: 03/01/24 Nustep x 8 min level 7 (PT present to discuss status and progress) Standing hamstring stretch 3 x 30 sec each LE Standing quad/hip flexor stretch 3 x 30 sec Rocker board x 2 min Gastroc stretch 5 x 10 sec Soleus stretch 5 x 10 sec Leg press 100 lbs 2 x 10 Gastroc press 100 lbs 2 x 10 Fwd T 2 x 10 each LE with 5 lb kb Sit to stand with purple ball for adduction x 10 Squat to table with purple ball for adduction x 10 SLS doing around the [place] wit 5 lb kb with cga x 10 each LE  TREATMENT DATE: 02/28/24 Nustep x 8 min level 7 (PT present to discuss status and progress) Demonstrated melt balls to lateral leg/distal IT band, as well as roller to mid IT band  Hip adduction isometric with purple ball x 20 Sit to stand with purple ball for adduction x 10 Squat to table with purple ball for adduction x 10 Lunges fwd x 10 each leg with cga Lunges lateral x 10 each with sba Fwd T x 10 each LE  Marching in place with opposite arm and leg using 2 lbs  dumbbells Marching in place with cross body hand to knee x 20 Wing taps facing wall 2 x 10 Reach and pull downs facing wall 2 x 10 Cone touches 3 x 10 each LE (had to raise cone several times, ended up on counter top)  TREATMENT DATE: 01/17/24 Initial eval completed and initial HEP established Educated on fall prevention and fall risk   PATIENT EDUCATION:  Education details: Discussed appropriate level of exercise and reason for falls, fall prevention Person educated: Patient Education method: Programmer, Multimedia, Facilities Manager, Verbal cues, and Handouts Education comprehension: verbalized understanding, returned demonstration, and verbal cues required  HOME EXERCISE PROGRAM: Access Code: [email or web address] URL: [email or web address] Date: 02/14/2024 Prepared by: Delon Haddock  Exercises - Standing Hamstring Stretch on Chair  - 1 x daily - 7 x weekly - 1 sets - 3 reps - 30 sec hold - Quadricep Stretch with Chair and Counter Support  - 1 x daily - 7 x weekly - 1 sets - 3 reps - 30 sec hold - Seated Figure 4 Piriformis Stretch  - 1 x daily - 7 x weekly - 1 sets - 3 reps - 30 sed hold - Sidelying Open Book Thoracic Lumbar Rotation and Extension  - 1 x daily - 7 x weekly - 1 sets - 10 reps - 5 sec hold - Single Leg Cone Touch  - 1 x daily - 7 x weekly - 3 sets - 10 reps - Side Stepping with Resistance at Ankles  - 1 x daily - 7 x weekly - 3 sets - 10 reps - Reverse Lunge With Slider at [employer]  -  1 x daily - 7 x weekly - 3 sets - 10 reps - Lateral Lunge With Slider  - 1 x daily - 7 x weekly - 3 sets - 10 reps  ASSESSMENT:  CLINICAL IMPRESSION: [place] continues to progress appropriately.  He no longer needs contact guard assist on dynamic balance task as he demonstrates good protective responses.  He does require stand by assist for the single leg dynamic tasks. He remains well motivated and compliant.   He would benefit from continuing skilled PT for quad rehab, balance training, gait  training and fall prevention.    OBJECTIVE IMPAIRMENTS: Abnormal gait, decreased balance, difficulty walking, decreased strength, hypomobility, increased fascial restrictions, increased muscle spasms, impaired flexibility, improper body mechanics, postural dysfunction, and pain.   ACTIVITY LIMITATIONS: carrying, lifting, standing, squatting, sleeping, stairs, transfers, bathing, toileting, dressing, hygiene/grooming, and caring for others  PARTICIPATION LIMITATIONS: meal prep, cleaning, laundry, interpersonal relationship, driving, shopping, and yard work  PERSONAL FACTORS: Age, Fitness, Past/current experiences, and 1-2 comorbidities: pacemaker Htn, CHF are also affecting patient's functional outcome.   REHAB POTENTIAL: Good  CLINICAL DECISION MAKING: Stable/uncomplicated  EVALUATION COMPLEXITY: Low   GOALS: Goals reviewed with patient? Yes  SHORT TERM GOALS: Target date: 02/14/2024  Pain report to be no greater than 4/10  Baseline: Goal status: Met 01/19/24  2.  Patient will be independent with initial HEP  Baseline:  Goal status: MET 01/30/24   LONG TERM GOALS: Target date: 03/13/2024  Patient to report pain no greater than 2/10  Baseline:  Goal status: MET 01/30/24  2.  Patient to be independent with advanced HEP  Baseline:  Goal status: MET 02/16/24  3.  BERG to improve by 2-3 points Baseline:  Goal status: In progress  4.  Functional tests to improve by 2-3 sec Baseline:  Goal status: In progress  5.  No falls during PT episode Baseline: had one fall due to knee instability while doing a putting maneuver at disc golf on 02/15/23 Goal status: In progress  6.  Patient to be able to ascend and descend steps without pain or no greater than 2/10  Baseline:  Goal status: In progress   PLAN:  PT FREQUENCY: 2x/week  PT DURATION: 8 weeks  PLANNED INTERVENTIONS: 97110-Therapeutic exercises, 97530- Therapeutic activity, 97112- Neuromuscular re-education, [phone]- Self  Care, 02859- Manual therapy, [phone]- Gait training, [phone]- Canalith repositioning, [phone]- Aquatic Therapy, [phone]- Electrical stimulation (unattended), [phone]- Electrical stimulation (manual), [id number]- Vasopneumatic device, [id number]- Ultrasound, [id number]- Traction (mechanical), [id number]- Ionotophoresis 4mg /ml Dexamethasone , [id number] (1-2 muscles), [id number] (3+ muscles)- Dry Needling, Patient/Family education, Balance training, Stair training, Taping, Joint mobilization, Spinal mobilization, Vestibular training, DME instructions, Cryotherapy, and Moist heat  PLAN FOR NEXT SESSION: Nustep, continue agility skills, slider work and increasingly higher level balance tasks for sing proper balance strategies, progress carioca and hurdles, progress quad rehab and balance training   Ninoska Goswick B. Emary Zalar, PT 03/08/24 10:37 AM [facility] [address] [place], [place] [address] Phone # [phone] Fax [phone]  "

## 2024-03-11 ENCOUNTER — Other Ambulatory Visit: Payer: Self-pay | Admitting: Physician Assistant

## 2024-03-11 ENCOUNTER — Ambulatory Visit: Payer: Self-pay | Admitting: Physician Assistant

## 2024-03-11 DIAGNOSIS — I5020 Unspecified systolic (congestive) heart failure: Secondary | ICD-10-CM

## 2024-03-11 DIAGNOSIS — I2510 Atherosclerotic heart disease of native coronary artery without angina pectoris: Secondary | ICD-10-CM

## 2024-03-11 DIAGNOSIS — I25118 Atherosclerotic heart disease of native coronary artery with other forms of angina pectoris: Secondary | ICD-10-CM

## 2024-03-11 NOTE — Progress Notes (Unsigned)
" °  Cardiology Office Note   Attempted to call the patient at 1:05pm today and left a VM. Will try again a little later today to review consent information for cardiac catheterization.    The patient understands that risks include but are not limited to stroke (1 in 1000), death (1 in 1000), kidney failure [usually temporary] (1 in 500), bleeding (1 in 200), allergic reaction [possibly serious] (1 in 200), and agrees to proceed.    Signed, Orren LOISE Fabry, PA-C   "

## 2024-03-11 NOTE — Progress Notes (Signed)
 Area of ischemia is small.  However, no other clear explanation for patient's dyspnea symptoms, reasonable to proceed with coronary and bypass graft angiography.  Thanks MJP

## 2024-03-13 ENCOUNTER — Ambulatory Visit

## 2024-03-13 DIAGNOSIS — R2681 Unsteadiness on feet: Secondary | ICD-10-CM

## 2024-03-13 DIAGNOSIS — M6281 Muscle weakness (generalized): Secondary | ICD-10-CM

## 2024-03-13 DIAGNOSIS — R293 Abnormal posture: Secondary | ICD-10-CM

## 2024-03-13 DIAGNOSIS — R262 Difficulty in walking, not elsewhere classified: Secondary | ICD-10-CM

## 2024-03-13 DIAGNOSIS — R252 Cramp and spasm: Secondary | ICD-10-CM

## 2024-03-13 NOTE — Therapy (Signed)
 " OUTPATIENT PHYSICAL THERAPY LOWER EXTREMITY TREATMENT   Patient Name: [name] MRN: [id number] DOB:Jun 23, 1940, 84 y.o., male Today's Date: 03/13/2024  END OF SESSION:  PT End of Session - 03/13/24 1119     Visit Number 14    Number of Visits 16    Date for Recertification  04/16/24    Authorization Type [employer] (recert completed on 03/08/24)    Authorization Time Period 01/17/24 through 04/16/24    Authorization - Visit Number 14    Authorization - Number of Visits 16    Progress Note Due on Visit 20    PT Start Time 1103    PT Stop Time 1145    PT Time Calculation (min) 42 min    Activity Tolerance Patient tolerated treatment well    Behavior During Therapy WFL for tasks assessed/performed           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   CHF (congestive heart failure) (HCC)    Elevated cholesterol    Gout    Hepatitis 1964   hepatitis A    HFrEF (heart failure with reduced ejection fraction) (HCC)    Hypertension    Moderate mitral regurgitation    Pre-diabetes    Severe aortic stenosis    Past Surgical History:  Procedure Laterality Date   AORTIC VALVE REPLACEMENT N/A 07/24/2017   Procedure: AORTIC VALVE REPLACEMENT (AVR) using 23mm Inspiris Aortic Valve;  Surgeon: Lucas Dorise POUR, MD;  Location: [facility] OR;  Service: Open Heart Surgery;  Laterality: N/A;   CLIPPING OF ATRIAL APPENDAGE N/A 07/24/2017   Procedure: CLIPPING OF ATRIAL APPENDAGE using a 50mm Atricure clip;  Surgeon: Lucas Dorise POUR, MD;  Location: [facility] OR;  Service: Open Heart Surgery;  Laterality: N/A;   CORONARY ARTERY BYPASS GRAFT N/A 07/24/2017   Procedure: CORONARY ARTERY BYPASS GRAFTING (CABG) times 3 using the left greater saphenous vein harvested endoscopically and left internal mammary artery.;  Surgeon: Lucas Dorise POUR, MD;  Location: [facility] OR;  Service: Open Heart Surgery;  Laterality: N/A;   EYE SURGERY Bilateral    lens  replacements for cataracts   HERNIA REPAIR     INGUINAL HERNIA REPAIR Right 05/06/2015   Procedure: LAPAROSCOPIC RIGHT INGUINAL HERNIA WITH MESH;  Surgeon: Vicenta Poli, MD;  Location: [facility] ORS;  Service: General;  Laterality: Right;   INSERTION OF MESH Right 05/06/2015   Procedure: INSERTION OF MESH;  Surgeon: Vicenta Poli, MD;  Location: [facility] ORS;  Service: General;  Laterality: Right;   IR BONE MARROW BIOPSY & ASPIRATION  09/15/2022   RIGHT HEART CATH N/A 03/21/2023   Procedure: RIGHT HEART CATH;  Surgeon: Elmira Newman PARAS, MD;  Location: [facility] INVASIVE CV LAB;  Service: Cardiovascular;  Laterality: N/A;   RIGHT/LEFT HEART CATH AND CORONARY ANGIOGRAPHY N/A 07/10/2017   Procedure: RIGHT/LEFT HEART CATH AND CORONARY ANGIOGRAPHY;  Surgeon: Elmira Newman PARAS, MD;  Location: [facility];  Service: Cardiovascular;  Laterality: N/A;   TEE WITHOUT CARDIOVERSION N/A 07/11/2017   Procedure: TRANSESOPHAGEAL ECHOCARDIOGRAM (TEE);  Surgeon: Elmira Newman PARAS, MD;  Location: [facility] ENDOSCOPY;  Service: Cardiovascular;  Laterality: N/A;   TEE WITHOUT CARDIOVERSION N/A 07/24/2017   Procedure: TRANSESOPHAGEAL ECHOCARDIOGRAM (TEE);  Surgeon: Lucas Dorise POUR, MD;  Location: [facility] OR;  Service: Open Heart Surgery;  Laterality: N/A;   TEE WITHOUT CARDIOVERSION N/A 08/01/2022   Procedure: TRANSESOPHAGEAL ECHOCARDIOGRAM;  Surgeon: Pietro Redell RAMAN,  MD;  Location: [facility] INV[facility]IVE CV LAB;  Service: Cardiovascular;  Laterality: N/A;   TOTAL KNEE ARTHROPLASTY Right 08/07/2019   Procedure: TOTAL KNEE ARTHROPLASTY;  Surgeon: Melodi Lerner, MD;  Location: [facility] ORS;  Service: Orthopedics;  Laterality: Right;   ULTRASOUND GUIDANCE FOR VASCULAR ACCESS  07/10/2017   Procedure: Ultrasound Guidance For Vascular Access;  Surgeon: Elmira Newman PARAS, MD;  Location: [facility] INVASIVE CV LAB;  Service: Cardiovascular;;   Patient Active Problem List   Diagnosis Date Noted   Presence of heart assist device (HCC) 03/29/2023   Leukopenia  09/13/2022   PICC (peripherally inserted central catheter) in place 08/30/2022   Pure hypercholesterolemia 08/03/2022   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   Encounter for assessment of peripherally inserted central catheter (PICC) 08/02/2022   Aortic valve endocarditis 08/01/2022   Cardiomyopathy (HCC) 08/01/2022   Chronic HFrEF (heart failure with reduced ejection fraction) (HCC) 08/01/2022   Prosthetic cardiac valve vegetation 08/01/2022   Anemia 08/01/2022   Streptococcal bacteremia 07/29/2022   Aortic atherosclerosis 07/29/2022   Medication management 07/29/2022   Bacteremia 07/29/2022   Low back pain 06/24/2022   Constipation 06/24/2022   Renal cyst, 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   PAC (premature atrial contraction) 03/01/2021   PVC (premature ventricular contraction) 03/01/2021   Atrial bigeminy 02/28/2020   OA (osteoarthritis) of knee 08/07/2019   Primary osteoarthritis of right knee 08/07/2019   Pain in right knee 05/17/2019   Coronary artery disease involving coronary bypass graft of native heart without angina pectoris 09/19/2018   Weakness 08/01/2018   Mixed hyperlipidemia 06/06/2018   S/P left atrial appendage ligation 07/25/2017   S/P AVR 07/25/2017   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   Coronary artery disease without angina pectoris 07/12/2017   HFrEF (heart failure with reduced ejection fraction) (HCC) 07/07/2017   Mitral regurgitation 07/07/2017   (HFpEF) heart failure with preserved ejection fraction (HCC) 07/07/2017   Gout 02/12/2013   Benign hypertension 02/12/2013   GERD (gastroesophageal reflux disease) 02/12/2013    PCP: Leonarda Roxan BROCKS, NP  REFERRING PROVIDER: Gil Greig BRAVO, NP  REFERRING DIAG: R26.81 (ICD-10-CM) - Unstable gait R26.89 (ICD-10-CM) - Balance problem  THERAPY DIAG:  Difficulty in walking, not elsewhere classified  Unsteadiness on feet  Muscle weakness (generalized)  Cramp and spasm  Abnormal posture  Rationale for Evaluation and Treatment:  Rehabilitation  ONSET DATE: 01/11/2024  SUBJECTIVE:   SUBJECTIVE STATEMENT: Patient reports, I feel like my balance is getting much better      PERTINENT HISTORY: na PAIN:  03/13/24 Are you having pain? Yes: NPRS scale: 0/10 Pain location: left knee Pain description: aching Aggravating factors: downhill or down steps Relieving factors: rest   PRECAUTIONS: Fall  RED FLAGS: None   WEIGHT BEARING RESTRICTIONS: No  FALLS:  Has patient fallen in last 6 months? Yes. 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  DIAGNOSTIC FINDINGS: na  PATIENT SURVEYS:  LEFS  Extreme difficulty/unable (  0), Quite a bit of difficulty (1), Moderate difficulty (2), Little difficulty (3), No difficulty (4) Survey date:  01/17/24 03/08/24  Any of your usual work, housework or school activities    2. Usual hobbies, recreational or sporting activities    3. Getting into/out of the bath    4. Walking between rooms    5. Putting on socks/shoes    6. Squatting     7. Lifting an object, like a bag of groceries from the floor    8. Performing light activities around your home    9. Performing heavy activities around your home    10. Getting into/out of a car    11. Walking 2 blocks    12. Walking 1 mile    13. Going up/down 10 stairs (1 flight)     14. Standing for 1 hour    15.  sitting for 1 hour    16. Running on even ground    17. Running on uneven ground    18. Making sharp turns while running fast    19. Hopping     20. Rolling over in bed    Score total:  62/80 59/80     COGNITION: Overall cognitive status: Within functional limits for tasks assessed     SENSATION: WFL   MUSCLE LENGTH: Hamstrings: Right 45 deg; Left 40 deg Thomas test: Right pos; Left pos  POSTURE: rounded shoulders and forward head  PALPATION: Mild crepitus   LOWER EXTREMITY ROM:   Initial eval  Right knee flexion : approx 110 degrees,  extension approx -20 Left knee flexion: approx 100 degrees, extension approx -20  03/08/24: Right knee flexion : approx 122 degrees,  extension approx -18 Left knee flexion: approx 120 degrees, extension approx -18   LOWER EXTREMITY MMT:   4/5 extension bilaterally   4/5 flexion bilaterally  LOWER EXTREMITY SPECIAL TESTS:  Knee special tests: Patellafemoral grind test: negative  FUNCTIONAL TESTS:  Initial eval: 5 times sit to stand: 12.03 sec Timed up and go (TUG): 13.15   03/08/24: 5 times sit to stand:  9.0 sec Timed up and go (TUG): 7.56 sec GAIT: Distance walked: 30 feet Assistive device utilized: None Level of assistance: Complete Independence Comments: slight deviation on turns but otherwise good heel to toe gait  TREATMENT DATE: 03/13/24 Recumbent bike x 8 min level 3 (PT present to discuss status and progress) BOSU push offs x 10 fwd each LE then x 10 lateral each LE Agility: fast feet x 20, then hop side to side x 20 Rocker board x 2 min Gastroc stretch x 10 hold 10 sec Hip matrix 2 x 10 hip abd and hip ext with 40 lbs on each LE Bilateral heel raises at barre 2 x 10 Rear lunge to pull through high knee 2 x 10 each LE with encouraging further hip extension on the rear lunge on second set Standing in tandem on balance pads around the worlds wit 10 lb kb with sba x 10 each LE both  directions Fwd T with 10 lb kb x 10 each LE Squat to touch 10 lb kb to floor 2 x 5  TREATMENT DATE: 03/08/24 Nustep x 8 min level 7 (PT present to discuss status and progress) Standing hamstring stretch 3 x 30 sec each LE Standing quad/hip flexor stretch 3 x 30 sec Bilateral heel raises at barre 2 x 10 Rear lunge to pull through high knee 2 x 10 each LE with encouraging further hip  extension on the rear lunge on second set Rocker board x 2 min  Recert assessment completed Hip matrix : hip extension and  Lunge to BOSU x 10 each LE fwd and x 10 each LE lateral SLS doing around the worlds wit 5 lb kb with cga x 10 each LE both directions Fwd T with 5 lb kb x 10 each LE Sit to stand with purple ball for adduction x 10 with 5 lb kb Squat to table with purple ball for adduction x 10 with 5 lb kb Step over and back using Teclor bars for increased height: fwd x 10 each LE then lateral x 10 each Cone touches 3 x 10 each LE  TREATMENT DATE: 03/06/24 Nustep x 8 min level 7 (PT present to discuss status and progress) Rear lunge to pull through high knee 2 x 10 each LE with encouraging further hip extension on the rear lunge on second set Rocker board x 2 min  Lunge to BOSU x 10 each LE fwd and x 10 each LE lateral SLS doing around the worlds wit 5 lb kb with cga x 10 each LE both directions Fwd T with 5 lb kb x 10 each LE Sit to stand with purple ball for adduction x 10 with 5 lb kb Squat to table with purple ball for adduction x 10 with 5 lb kb Step over and back using Teclor bars for increased height: fwd x 10 each LE then lateral x 10 each Cone touches 3 x 10 each LE  TREATMENT DATE: 01/17/24 Initial eval completed and initial HEP established Educated on fall prevention and fall risk   PATIENT EDUCATION:  Education details: Discussed appropriate level of exercise and reason for falls, fall prevention Person educated: Patient Education method: Programmer, Multimedia, Facilities Manager, Verbal cues,  and Handouts Education comprehension: verbalized understanding, returned demonstration, and verbal cues required  HOME EXERCISE PROGRAM: Access Code: [email or web address] URL: [email or web address] Date: 02/14/2024 Prepared by: Delon Haddock  Exercises - Standing Hamstring Stretch on Chair  - 1 x daily - 7 x weekly - 1 sets - 3 reps - 30 sec hold - Quadricep Stretch with Chair and Counter Support  - 1 x daily - 7 x weekly - 1 sets - 3 reps - 30 sec hold - Seated Figure 4 Piriformis Stretch  - 1 x daily - 7 x weekly - 1 sets - 3 reps - 30 sed hold - Sidelying Open Book Thoracic Lumbar Rotation and Extension  - 1 x daily - 7 x weekly - 1 sets - 10 reps - 5 sec hold - Single Leg Cone Touch  - 1 x daily - 7 x weekly - 3 sets - 10 reps - Side Stepping with Resistance at Ankles  - 1 x daily - 7 x weekly - 3 sets - 10 reps - Reverse Lunge With Slider at Counter  - 1 x daily - 7 x weekly - 3 sets - 10 reps - Lateral Lunge With Slider  - 1 x daily - 7 x weekly - 3 sets - 10 reps  ASSESSMENT:  CLINICAL IMPRESSION: [place] continues to progress appropriately.   He would benefit from continuing skilled PT for quad rehab, balance training, gait training and fall prevention.    OBJECTIVE IMPAIRMENTS: Abnormal gait, decreased balance, difficulty walking, decreased strength, hypomobility, increased fascial restrictions, increased muscle spasms, impaired flexibility, improper body mechanics, postural dysfunction, and pain.   ACTIVITY LIMITATIONS: carrying, lifting, standing, squatting, sleeping, stairs, transfers, bathing, toileting, dressing, hygiene/grooming,  and caring for others  PARTICIPATION LIMITATIONS: meal prep, cleaning, laundry, interpersonal relationship, driving, shopping, and yard work  PERSONAL FACTORS: Age, Fitness, Past/current experiences, and 1-2 comorbidities: pacemaker Htn, CHF are also affecting patient's functional outcome.   REHAB POTENTIAL: Good  CLINICAL DECISION MAKING:  Stable/uncomplicated  EVALUATION COMPLEXITY: Low   GOALS: Goals reviewed with patient? Yes  SHORT TERM GOALS: Target date: 02/14/2024  Pain report to be no greater than 4/10  Baseline: Goal status: Met 01/19/24  2.  Patient will be independent with initial HEP  Baseline:  Goal status: MET 01/30/24   LONG TERM GOALS: Target date: 03/13/2024  Patient to report pain no greater than 2/10  Baseline:  Goal status: MET 01/30/24  2.  Patient to be independent with advanced HEP  Baseline:  Goal status: MET 02/16/24  3.  BERG to improve by 2-3 points Baseline:  Goal status: In progress  4.  Functional tests to improve by 2-3 sec Baseline:  Goal status: In progress  5.  No falls during PT episode Baseline: had one fall due to knee instability while doing a putting maneuver at disc golf on 02/15/23 Goal status: MET 03/13/24  6.  Patient to be able to ascend and descend steps without pain or no greater than 2/10  Baseline:  Goal status: MET 03/13/24   PLAN:  PT FREQUENCY: 2x/week  PT DURATION: 8 weeks  PLANNED INTERVENTIONS: 97110-Therapeutic exercises, 97530- Therapeutic activity, 97112- Neuromuscular re-education, 97535- Self Care, [id number]- Manual therapy, [phone]- Gait training, [phone]- Canalith repositioning, [id number]- Aquatic Therapy, [phone]- Electrical stimulation (unattended), [phone]- Electrical stimulation (manual), [id number]- Vasopneumatic device, [id number]- Ultrasound, [id number]- Traction (mechanical), [id number]- Ionotophoresis 4mg /ml Dexamethasone , [id number] (1-2 muscles), [id number] (3+ muscles)- Dry Needling, Patient/Family education, Balance training, Stair training, Taping, Joint mobilization, Spinal mobilization, Vestibular training, DME instructions, Cryotherapy, and Moist heat  PLAN FOR NEXT SESSION: Nustep, continue agility skills, slider work and increasingly higher level balance tasks for sing proper balance strategies, progress carioca and hurdles, progress quad rehab and balance  training   Jaxzen Vanhorn B. Celestia Duva, PT 03/13/24 8:17 PM [facility] [address] [place], [place] [address] Phone # [phone] Fax [phone]  "

## 2024-03-14 NOTE — Telephone Encounter (Signed)
 Orren spoke with Dr Elmira who would like for the patient to have a cardiac catherization, but with him when he returns back from vacation.   Patient is agreeable with waiting two weeks for cath.    Cath scheduled for Wednesday February 18th at 10am.

## 2024-03-14 NOTE — Telephone Encounter (Signed)
"   ° ° ° °  Cardiac Catheterization   You are scheduled for a Cardiac Catheterization on Wednesday, February 18 with Dr. Newman Lawrence.  1. Please arrive at the [facility] (Main Entrance A) at [facility]: [address] [place], [place] [address] at 8:00 AM (This time is 2 hour(s) before your procedure to ensure your preparation).   Free valet parking service is available. You will check in at ADMITTING. The support person will be asked to wait in the waiting room.  It is OK to have someone drop you off and come back when you are ready to be discharged.        Special note: Every effort is made to have your procedure done on time. Please understand that emergencies sometimes delay scheduled procedures.  2. Diet: Nothing to eat after midnight.  3. Hydration:On February 18, you may drink approved liquids (see below) until 2 hours before the procedure with 8 oz of water  as your last intake.   List of approved liquids water , clear juice, clear tea, black coffee, fruit juices, non-citric and without pulp, carbonated beverages, Gatorade, Kool -Aid, plain Jello-O and plain ice popsicles.  4. Labs: You will need to have blood drawn on Friday, February 6 at [facility] (1st Floor), [address] [place], [place] [address]. You do not need to be fasting.  5. Medication instructions in preparation for your procedure:   Contrast Allergy: No  Stop taking, Torsemide  (Demadex ) Tuesday, February 17, HOLD ON THE MORNING OF CATH  On the morning of your procedure, take Aspirin  81 mg and any morning medicines NOT listed above.  You may use sips of water .  6. Plan to go home the same day, you will only stay overnight if medically necessary.  7. You MUST have a responsible adult to drive you home.  8. An adult MUST be with you the first 24 hours after you arrive home.  9. Bring a current list of your medications, and the last time and date  medication taken.  10. Bring ID and current insurance cards.  11.Please wear clothes that are easy to get on and off and wear slip-on shoes.  Thank you for allowing us  to care for you!   --  Invasive Cardiovascular services  "

## 2024-03-14 NOTE — Telephone Encounter (Signed)
 Contacted the patient to go over instructions for cath but also I have released the instructions on MyChart and advised a call back with any questions or concerns.

## 2024-03-15 LAB — BASIC METABOLIC PANEL WITH GFR
BUN/Creatinine Ratio: 22 (ref 10–24)
BUN: 19 mg/dL (ref 8–27)
CO2: 24 mmol/L (ref 20–29)
Calcium: 10 mg/dL (ref 8.6–10.2)
Chloride: 99 mmol/L (ref 96–106)
Creatinine, Ser: 0.87 mg/dL (ref 0.76–1.27)
Glucose: 89 mg/dL (ref 70–99)
Potassium: 5.5 mmol/L — ABNORMAL HIGH (ref 3.5–5.2)
Sodium: 137 mmol/L (ref 134–144)
eGFR: 86 mL/min/{1.73_m2}

## 2024-03-15 LAB — CBC

## 2024-03-19 ENCOUNTER — Ambulatory Visit: Payer: Self-pay

## 2024-03-25 ENCOUNTER — Ambulatory Visit: Payer: Self-pay | Admitting: Family

## 2024-03-26 ENCOUNTER — Encounter: Admitting: Dermatology

## 2024-03-27 ENCOUNTER — Ambulatory Visit (HOSPITAL_COMMUNITY): Admission: RE | Admit: 2024-03-27 | Admitting: Cardiology

## 2024-03-27 ENCOUNTER — Encounter (HOSPITAL_COMMUNITY): Admission: RE | Payer: Self-pay

## 2024-04-01 ENCOUNTER — Ambulatory Visit (HOSPITAL_COMMUNITY)

## 2024-04-09 ENCOUNTER — Ambulatory Visit: Payer: Self-pay

## 2024-04-10 ENCOUNTER — Ambulatory Visit: Admitting: Oncology

## 2024-04-10 ENCOUNTER — Other Ambulatory Visit

## 2024-04-17 ENCOUNTER — Ambulatory Visit: Admitting: Physician Assistant

## 2024-06-07 ENCOUNTER — Ambulatory Visit: Payer: Self-pay | Admitting: Family

## 2024-07-11 ENCOUNTER — Ambulatory Visit: Admitting: Dermatology
